# Patient Record
Sex: Female | Born: 1980
Health system: Southern US, Community
[De-identification: ages and names within clinical notes are randomized; demographics above are authoritative.]

## PROBLEM LIST (undated history)

## (undated) DIAGNOSIS — D649 Anemia, unspecified: Secondary | ICD-10-CM

## (undated) DIAGNOSIS — E119 Type 2 diabetes mellitus without complications: Secondary | ICD-10-CM

## (undated) DIAGNOSIS — R011 Cardiac murmur, unspecified: Secondary | ICD-10-CM

## (undated) DIAGNOSIS — R51 Headache: Secondary | ICD-10-CM

## (undated) DIAGNOSIS — K219 Gastro-esophageal reflux disease without esophagitis: Secondary | ICD-10-CM

## (undated) DIAGNOSIS — R519 Headache, unspecified: Secondary | ICD-10-CM

## (undated) DIAGNOSIS — Z87442 Personal history of urinary calculi: Secondary | ICD-10-CM

## (undated) DIAGNOSIS — T7840XA Allergy, unspecified, initial encounter: Secondary | ICD-10-CM

## (undated) DIAGNOSIS — M1990 Unspecified osteoarthritis, unspecified site: Secondary | ICD-10-CM

## (undated) DIAGNOSIS — N921 Excessive and frequent menstruation with irregular cycle: Secondary | ICD-10-CM

## (undated) DIAGNOSIS — Z8669 Personal history of other diseases of the nervous system and sense organs: Secondary | ICD-10-CM

## (undated) DIAGNOSIS — K5900 Constipation, unspecified: Secondary | ICD-10-CM

## (undated) DIAGNOSIS — E559 Vitamin D deficiency, unspecified: Secondary | ICD-10-CM

## (undated) DIAGNOSIS — I10 Essential (primary) hypertension: Secondary | ICD-10-CM

## (undated) DIAGNOSIS — R7303 Prediabetes: Secondary | ICD-10-CM

## (undated) DIAGNOSIS — L409 Psoriasis, unspecified: Secondary | ICD-10-CM

## (undated) DIAGNOSIS — IMO0001 Reserved for inherently not codable concepts without codable children: Secondary | ICD-10-CM

## (undated) HISTORY — DX: Constipation, unspecified: K59.00

## (undated) HISTORY — DX: Allergy, unspecified, initial encounter: T78.40XA

## (undated) HISTORY — DX: Prediabetes: R73.03

## (undated) HISTORY — DX: Psoriasis, unspecified: L40.9

## (undated) HISTORY — DX: Personal history of other diseases of the nervous system and sense organs: Z86.69

## (undated) HISTORY — DX: Vitamin D deficiency, unspecified: E55.9

## (undated) HISTORY — PX: WISDOM TOOTH EXTRACTION: SHX21

## (undated) HISTORY — PX: KNEE CARTILAGE SURGERY: SHX688

---

## 2001-07-24 ENCOUNTER — Emergency Department (HOSPITAL_COMMUNITY): Admission: EM | Admit: 2001-07-24 | Discharge: 2001-07-24 | Payer: Self-pay | Admitting: Emergency Medicine

## 2001-07-24 ENCOUNTER — Encounter: Payer: Self-pay | Admitting: Emergency Medicine

## 2002-10-03 ENCOUNTER — Emergency Department (HOSPITAL_COMMUNITY): Admission: EM | Admit: 2002-10-03 | Discharge: 2002-10-03 | Payer: Self-pay | Admitting: Emergency Medicine

## 2002-10-03 ENCOUNTER — Encounter: Payer: Self-pay | Admitting: Emergency Medicine

## 2003-10-19 ENCOUNTER — Emergency Department (HOSPITAL_COMMUNITY): Admission: AD | Admit: 2003-10-19 | Discharge: 2003-10-19 | Payer: Self-pay | Admitting: Family Medicine

## 2003-10-29 ENCOUNTER — Observation Stay (HOSPITAL_COMMUNITY): Admission: AD | Admit: 2003-10-29 | Discharge: 2003-10-29 | Payer: Self-pay | Admitting: Obstetrics and Gynecology

## 2003-10-29 HISTORY — PX: DILATION AND CURETTAGE OF UTERUS: SHX78

## 2003-10-30 ENCOUNTER — Inpatient Hospital Stay (HOSPITAL_COMMUNITY): Admission: AD | Admit: 2003-10-30 | Discharge: 2003-10-31 | Payer: Self-pay | Admitting: Obstetrics and Gynecology

## 2003-10-30 ENCOUNTER — Encounter (INDEPENDENT_AMBULATORY_CARE_PROVIDER_SITE_OTHER): Payer: Self-pay | Admitting: Specialist

## 2003-11-05 ENCOUNTER — Inpatient Hospital Stay (HOSPITAL_COMMUNITY): Admission: AD | Admit: 2003-11-05 | Discharge: 2003-11-05 | Payer: Self-pay | Admitting: Obstetrics and Gynecology

## 2003-11-28 HISTORY — PX: DILATION AND CURETTAGE OF UTERUS: SHX78

## 2003-12-15 ENCOUNTER — Inpatient Hospital Stay (HOSPITAL_COMMUNITY): Admission: AD | Admit: 2003-12-15 | Discharge: 2003-12-16 | Payer: Self-pay | Admitting: Obstetrics and Gynecology

## 2003-12-18 ENCOUNTER — Inpatient Hospital Stay (HOSPITAL_COMMUNITY): Admission: AD | Admit: 2003-12-18 | Discharge: 2003-12-18 | Payer: Self-pay | Admitting: Obstetrics and Gynecology

## 2003-12-19 ENCOUNTER — Ambulatory Visit (HOSPITAL_COMMUNITY): Admission: AD | Admit: 2003-12-19 | Discharge: 2003-12-19 | Payer: Self-pay | Admitting: Obstetrics and Gynecology

## 2003-12-20 ENCOUNTER — Encounter (INDEPENDENT_AMBULATORY_CARE_PROVIDER_SITE_OTHER): Payer: Self-pay | Admitting: Specialist

## 2004-02-28 ENCOUNTER — Encounter: Admission: RE | Admit: 2004-02-28 | Discharge: 2004-02-28 | Payer: Self-pay | Admitting: Internal Medicine

## 2004-03-15 ENCOUNTER — Encounter: Admission: RE | Admit: 2004-03-15 | Discharge: 2004-03-15 | Payer: Self-pay | Admitting: Internal Medicine

## 2004-06-25 ENCOUNTER — Emergency Department (HOSPITAL_COMMUNITY): Admission: EM | Admit: 2004-06-25 | Discharge: 2004-06-25 | Payer: Self-pay | Admitting: Family Medicine

## 2004-07-17 ENCOUNTER — Emergency Department (HOSPITAL_COMMUNITY): Admission: EM | Admit: 2004-07-17 | Discharge: 2004-07-17 | Payer: Self-pay | Admitting: Family Medicine

## 2004-07-19 ENCOUNTER — Emergency Department (HOSPITAL_COMMUNITY): Admission: EM | Admit: 2004-07-19 | Discharge: 2004-07-19 | Payer: Self-pay

## 2004-10-23 ENCOUNTER — Emergency Department (HOSPITAL_COMMUNITY): Admission: EM | Admit: 2004-10-23 | Discharge: 2004-10-23 | Payer: Self-pay | Admitting: Family Medicine

## 2005-05-22 ENCOUNTER — Emergency Department (HOSPITAL_COMMUNITY): Admission: EM | Admit: 2005-05-22 | Discharge: 2005-05-22 | Payer: Self-pay | Admitting: Family Medicine

## 2005-07-09 ENCOUNTER — Other Ambulatory Visit: Admission: RE | Admit: 2005-07-09 | Discharge: 2005-07-09 | Payer: Self-pay | Admitting: Obstetrics and Gynecology

## 2005-08-25 ENCOUNTER — Emergency Department (HOSPITAL_COMMUNITY): Admission: EM | Admit: 2005-08-25 | Discharge: 2005-08-25 | Payer: Self-pay | Admitting: Emergency Medicine

## 2005-09-14 ENCOUNTER — Emergency Department (HOSPITAL_COMMUNITY): Admission: EM | Admit: 2005-09-14 | Discharge: 2005-09-14 | Payer: Self-pay | Admitting: Emergency Medicine

## 2005-09-15 ENCOUNTER — Emergency Department (HOSPITAL_COMMUNITY): Admission: EM | Admit: 2005-09-15 | Discharge: 2005-09-15 | Payer: Self-pay | Admitting: Emergency Medicine

## 2005-11-02 ENCOUNTER — Ambulatory Visit: Payer: Self-pay | Admitting: Family Medicine

## 2005-11-21 ENCOUNTER — Ambulatory Visit: Payer: Self-pay | Admitting: Family Medicine

## 2006-01-11 ENCOUNTER — Ambulatory Visit: Payer: Self-pay | Admitting: Family Medicine

## 2006-01-14 ENCOUNTER — Ambulatory Visit (HOSPITAL_COMMUNITY): Admission: RE | Admit: 2006-01-14 | Discharge: 2006-01-14 | Payer: Self-pay | Admitting: Family Medicine

## 2006-01-18 ENCOUNTER — Ambulatory Visit: Payer: Self-pay | Admitting: Family Medicine

## 2006-01-22 ENCOUNTER — Ambulatory Visit: Payer: Self-pay | Admitting: Family Medicine

## 2006-01-22 ENCOUNTER — Ambulatory Visit: Payer: Self-pay | Admitting: *Deleted

## 2006-02-18 ENCOUNTER — Emergency Department (HOSPITAL_COMMUNITY): Admission: EM | Admit: 2006-02-18 | Discharge: 2006-02-18 | Payer: Self-pay | Admitting: Family Medicine

## 2006-04-15 ENCOUNTER — Ambulatory Visit: Payer: Self-pay | Admitting: Family Medicine

## 2006-05-06 ENCOUNTER — Ambulatory Visit: Payer: Self-pay | Admitting: Family Medicine

## 2006-07-09 ENCOUNTER — Ambulatory Visit: Payer: Self-pay | Admitting: Family Medicine

## 2006-08-23 ENCOUNTER — Ambulatory Visit: Payer: Self-pay | Admitting: Family Medicine

## 2006-09-01 ENCOUNTER — Emergency Department (HOSPITAL_COMMUNITY): Admission: EM | Admit: 2006-09-01 | Discharge: 2006-09-01 | Payer: Self-pay | Admitting: Family Medicine

## 2006-10-01 ENCOUNTER — Ambulatory Visit: Payer: Self-pay | Admitting: Family Medicine

## 2006-10-12 ENCOUNTER — Emergency Department (HOSPITAL_COMMUNITY): Admission: EM | Admit: 2006-10-12 | Discharge: 2006-10-12 | Payer: Self-pay | Admitting: Emergency Medicine

## 2006-11-09 ENCOUNTER — Emergency Department (HOSPITAL_COMMUNITY): Admission: EM | Admit: 2006-11-09 | Discharge: 2006-11-09 | Payer: Self-pay | Admitting: Emergency Medicine

## 2006-11-10 ENCOUNTER — Emergency Department (HOSPITAL_COMMUNITY): Admission: EM | Admit: 2006-11-10 | Discharge: 2006-11-10 | Payer: Self-pay | Admitting: Emergency Medicine

## 2006-11-11 ENCOUNTER — Emergency Department (HOSPITAL_COMMUNITY): Admission: EM | Admit: 2006-11-11 | Discharge: 2006-11-11 | Payer: Self-pay | Admitting: Emergency Medicine

## 2006-12-24 ENCOUNTER — Ambulatory Visit: Payer: Self-pay | Admitting: Family Medicine

## 2007-03-14 ENCOUNTER — Ambulatory Visit: Payer: Self-pay | Admitting: Family Medicine

## 2007-04-16 ENCOUNTER — Encounter (INDEPENDENT_AMBULATORY_CARE_PROVIDER_SITE_OTHER): Payer: Self-pay | Admitting: *Deleted

## 2007-05-08 ENCOUNTER — Ambulatory Visit: Payer: Self-pay | Admitting: Family Medicine

## 2007-05-08 ENCOUNTER — Encounter (INDEPENDENT_AMBULATORY_CARE_PROVIDER_SITE_OTHER): Payer: Self-pay | Admitting: Family Medicine

## 2007-05-08 LAB — CONVERTED CEMR LAB
AST: 13 units/L (ref 0–37)
Albumin: 4.4 g/dL (ref 3.5–5.2)
Alkaline Phosphatase: 60 units/L (ref 39–117)
Calcium: 9.3 mg/dL (ref 8.4–10.5)
Creatinine, Ser: 0.62 mg/dL (ref 0.40–1.20)
Potassium: 4 meq/L (ref 3.5–5.3)
Total Protein: 7.4 g/dL (ref 6.0–8.3)
Triglycerides: 109 mg/dL (ref <150)

## 2007-05-24 ENCOUNTER — Emergency Department (HOSPITAL_COMMUNITY): Admission: EM | Admit: 2007-05-24 | Discharge: 2007-05-24 | Payer: Self-pay | Admitting: Family Medicine

## 2007-06-09 ENCOUNTER — Ambulatory Visit: Payer: Self-pay | Admitting: Family Medicine

## 2007-06-16 ENCOUNTER — Ambulatory Visit: Payer: Self-pay | Admitting: Family Medicine

## 2007-08-16 ENCOUNTER — Emergency Department (HOSPITAL_COMMUNITY): Admission: EM | Admit: 2007-08-16 | Discharge: 2007-08-16 | Payer: Self-pay | Admitting: Emergency Medicine

## 2007-08-25 ENCOUNTER — Ambulatory Visit: Payer: Self-pay | Admitting: Family Medicine

## 2007-08-25 LAB — CONVERTED CEMR LAB
T3, Total: 133.1 ng/dL (ref 80.0–204.0)
T4, Total: 7.9 ug/dL (ref 5.0–12.5)
TSH: 1.366 microintl units/mL (ref 0.350–5.50)

## 2007-09-01 ENCOUNTER — Ambulatory Visit: Payer: Self-pay | Admitting: Family Medicine

## 2007-09-25 ENCOUNTER — Ambulatory Visit: Payer: Self-pay | Admitting: Family Medicine

## 2007-10-09 ENCOUNTER — Ambulatory Visit: Payer: Self-pay | Admitting: Family Medicine

## 2007-10-24 ENCOUNTER — Emergency Department (HOSPITAL_COMMUNITY): Admission: EM | Admit: 2007-10-24 | Discharge: 2007-10-24 | Payer: Self-pay | Admitting: Emergency Medicine

## 2007-11-02 ENCOUNTER — Emergency Department (HOSPITAL_COMMUNITY): Admission: EM | Admit: 2007-11-02 | Discharge: 2007-11-02 | Payer: Self-pay | Admitting: Family Medicine

## 2007-11-10 ENCOUNTER — Ambulatory Visit: Payer: Self-pay | Admitting: Family Medicine

## 2007-11-10 LAB — CONVERTED CEMR LAB
AST: 17 units/L (ref 0–37)
CO2: 22 meq/L (ref 19–32)
Chloride: 106 meq/L (ref 96–112)
Glucose, Bld: 110 mg/dL — ABNORMAL HIGH (ref 70–99)
Potassium: 4.2 meq/L (ref 3.5–5.3)
Sodium: 142 meq/L (ref 135–145)

## 2007-11-21 ENCOUNTER — Ambulatory Visit: Payer: Self-pay | Admitting: Internal Medicine

## 2007-11-24 ENCOUNTER — Ambulatory Visit (HOSPITAL_COMMUNITY): Admission: RE | Admit: 2007-11-24 | Discharge: 2007-11-24 | Payer: Self-pay | Admitting: Family Medicine

## 2007-11-27 ENCOUNTER — Ambulatory Visit: Payer: Self-pay | Admitting: Internal Medicine

## 2007-12-16 ENCOUNTER — Ambulatory Visit: Payer: Self-pay | Admitting: Internal Medicine

## 2008-01-09 ENCOUNTER — Ambulatory Visit: Payer: Self-pay | Admitting: Family Medicine

## 2008-01-12 ENCOUNTER — Ambulatory Visit: Payer: Self-pay | Admitting: Internal Medicine

## 2008-02-10 ENCOUNTER — Inpatient Hospital Stay (HOSPITAL_COMMUNITY): Admission: AD | Admit: 2008-02-10 | Discharge: 2008-02-10 | Payer: Self-pay | Admitting: Gynecology

## 2008-02-12 ENCOUNTER — Ambulatory Visit: Payer: Self-pay | Admitting: Internal Medicine

## 2008-03-12 ENCOUNTER — Ambulatory Visit: Payer: Self-pay | Admitting: Family Medicine

## 2008-05-03 ENCOUNTER — Emergency Department (HOSPITAL_COMMUNITY): Admission: EM | Admit: 2008-05-03 | Discharge: 2008-05-03 | Payer: Self-pay | Admitting: Family Medicine

## 2008-05-05 ENCOUNTER — Ambulatory Visit: Payer: Self-pay | Admitting: Internal Medicine

## 2008-06-08 ENCOUNTER — Emergency Department (HOSPITAL_COMMUNITY): Admission: EM | Admit: 2008-06-08 | Discharge: 2008-06-08 | Payer: Self-pay | Admitting: Emergency Medicine

## 2008-07-27 ENCOUNTER — Ambulatory Visit: Payer: Self-pay | Admitting: Internal Medicine

## 2008-09-19 ENCOUNTER — Emergency Department (HOSPITAL_COMMUNITY): Admission: EM | Admit: 2008-09-19 | Discharge: 2008-09-19 | Payer: Self-pay | Admitting: Emergency Medicine

## 2008-10-11 ENCOUNTER — Ambulatory Visit: Payer: Self-pay | Admitting: Internal Medicine

## 2009-01-03 ENCOUNTER — Ambulatory Visit: Payer: Self-pay | Admitting: Family Medicine

## 2009-03-25 ENCOUNTER — Encounter (INDEPENDENT_AMBULATORY_CARE_PROVIDER_SITE_OTHER): Payer: Self-pay | Admitting: Family Medicine

## 2009-03-25 ENCOUNTER — Ambulatory Visit: Payer: Self-pay | Admitting: Family Medicine

## 2009-03-25 LAB — CONVERTED CEMR LAB
BUN: 8 mg/dL (ref 6–23)
Basophils Absolute: 0 10*3/uL (ref 0.0–0.1)
Basophils Relative: 0 % (ref 0–1)
CO2: 24 meq/L (ref 19–32)
Cholesterol: 163 mg/dL (ref 0–200)
Eosinophils Absolute: 0.2 10*3/uL (ref 0.0–0.7)
Glucose, Bld: 91 mg/dL (ref 70–99)
HCT: 36 % (ref 36.0–46.0)
HDL: 31 mg/dL — ABNORMAL LOW (ref >39)
Hemoglobin: 11.3 g/dL — ABNORMAL LOW (ref 12.0–15.0)
Lymphocytes Relative: 30 % (ref 12–46)
MCHC: 31.4 g/dL (ref 30.0–36.0)
MCV: 82 fL (ref 78.0–100.0)
Monocytes Relative: 8 % (ref 3–12)
RDW: 14.4 % (ref 11.5–15.5)
Triglycerides: 106 mg/dL (ref <150)
Vit D, 25-Hydroxy: 12 ng/mL — ABNORMAL LOW (ref 30–89)
WBC: 5.2 10*3/uL (ref 4.0–10.5)

## 2009-06-15 ENCOUNTER — Ambulatory Visit: Payer: Self-pay | Admitting: Internal Medicine

## 2009-07-30 ENCOUNTER — Emergency Department (HOSPITAL_COMMUNITY): Admission: EM | Admit: 2009-07-30 | Discharge: 2009-07-30 | Payer: Self-pay | Admitting: Emergency Medicine

## 2009-07-30 HISTORY — PX: COMBINED HYSTEROSCOPY DIAGNOSTIC / D&C: SUR297

## 2009-07-30 HISTORY — PX: DILATION AND CURETTAGE OF UTERUS: SHX78

## 2009-08-10 ENCOUNTER — Ambulatory Visit (HOSPITAL_COMMUNITY): Admission: RE | Admit: 2009-08-10 | Discharge: 2009-08-10 | Payer: Self-pay | Admitting: Family Medicine

## 2009-08-28 ENCOUNTER — Emergency Department (HOSPITAL_COMMUNITY): Admission: EM | Admit: 2009-08-28 | Discharge: 2009-08-28 | Payer: Self-pay | Admitting: Family Medicine

## 2009-08-29 ENCOUNTER — Ambulatory Visit: Payer: Self-pay | Admitting: Internal Medicine

## 2009-09-02 ENCOUNTER — Telehealth (INDEPENDENT_AMBULATORY_CARE_PROVIDER_SITE_OTHER): Payer: Self-pay | Admitting: *Deleted

## 2009-09-03 ENCOUNTER — Emergency Department (HOSPITAL_COMMUNITY): Admission: EM | Admit: 2009-09-03 | Discharge: 2009-09-03 | Payer: Self-pay | Admitting: Emergency Medicine

## 2009-09-13 ENCOUNTER — Ambulatory Visit: Payer: Self-pay | Admitting: Family Medicine

## 2009-11-02 ENCOUNTER — Ambulatory Visit: Payer: Self-pay | Admitting: Obstetrics & Gynecology

## 2009-11-03 ENCOUNTER — Encounter: Payer: Self-pay | Admitting: Obstetrics & Gynecology

## 2009-11-03 LAB — CONVERTED CEMR LAB: Chlamydia, DNA Probe: NEGATIVE

## 2009-11-24 ENCOUNTER — Ambulatory Visit: Payer: Self-pay | Admitting: Obstetrics & Gynecology

## 2010-01-26 ENCOUNTER — Ambulatory Visit: Payer: Self-pay | Admitting: Obstetrics & Gynecology

## 2010-01-26 ENCOUNTER — Encounter (INDEPENDENT_AMBULATORY_CARE_PROVIDER_SITE_OTHER): Payer: Self-pay | Admitting: *Deleted

## 2010-02-03 ENCOUNTER — Ambulatory Visit (HOSPITAL_COMMUNITY): Admission: RE | Admit: 2010-02-03 | Discharge: 2010-02-03 | Payer: Self-pay | Admitting: Family Medicine

## 2010-02-07 ENCOUNTER — Ambulatory Visit: Payer: Self-pay | Admitting: Family Medicine

## 2010-02-16 ENCOUNTER — Ambulatory Visit: Payer: Self-pay | Admitting: Obstetrics and Gynecology

## 2010-03-09 ENCOUNTER — Ambulatory Visit: Payer: Self-pay | Admitting: Obstetrics & Gynecology

## 2010-03-10 ENCOUNTER — Encounter: Payer: Self-pay | Admitting: Obstetrics & Gynecology

## 2010-03-10 LAB — CONVERTED CEMR LAB
HCT: 34.1 % — ABNORMAL LOW (ref 36.0–46.0)
Hemoglobin: 10.7 g/dL — ABNORMAL LOW (ref 12.0–15.0)
Platelets: 278 10*3/uL (ref 150–400)
RDW: 14.1 % (ref 11.5–15.5)

## 2010-04-13 ENCOUNTER — Ambulatory Visit: Payer: Self-pay | Admitting: Obstetrics & Gynecology

## 2010-04-13 ENCOUNTER — Ambulatory Visit (HOSPITAL_COMMUNITY): Admission: RE | Admit: 2010-04-13 | Discharge: 2010-04-13 | Payer: Self-pay | Admitting: Obstetrics & Gynecology

## 2010-05-04 ENCOUNTER — Ambulatory Visit: Payer: Self-pay | Admitting: Family Medicine

## 2010-08-03 ENCOUNTER — Ambulatory Visit: Admit: 2010-08-03 | Payer: Self-pay | Admitting: Obstetrics and Gynecology

## 2010-08-29 NOTE — Progress Notes (Signed)
Summary: triage/cough congestion  Phone Note Call from Patient   Caller: Patient Reason for Call: Talk to Nurse Summary of Call: Patient states she went to ED on 1/30 for Asthmatic Bronchitis..She states she has coughing spells and is SOB..Patient was given prednisone, allegra, albuterol inhaler and tessalon at the ED.Marland KitchenPatient states she does not have fever at this time..No vomiting or diarrhea..Dr. Audria Nine consulted...advised she may increase inhaler at times of severe coughting to 2 puffs q 5 min. up to 10 puffs if needed. New RX for tessalon given to pharmacy.Marland KitchenMarland KitchenPatient advised she probably has a virus and it has to run its course..If s/s worsen call back.  Initial call taken by: Conchita Paris,  September 02, 2009 11:09 AM

## 2010-10-12 LAB — CBC
HCT: 39.9 % (ref 36.0–46.0)
Platelets: 264 10*3/uL (ref 150–400)
RBC: 4.81 MIL/uL (ref 3.87–5.11)
RDW: 14.6 % (ref 11.5–15.5)

## 2010-10-12 LAB — BASIC METABOLIC PANEL
BUN: 8 mg/dL (ref 6–23)
Creatinine, Ser: 0.55 mg/dL (ref 0.4–1.2)
GFR calc Af Amer: >60 mL/min (ref >60)
Potassium: 3.9 mEq/L (ref 3.5–5.1)

## 2010-10-12 LAB — PREGNANCY, URINE: Preg Test, Ur: NEGATIVE

## 2010-10-15 LAB — WET PREP, GENITAL: Yeast Wet Prep HPF POC: NONE SEEN

## 2010-10-15 LAB — POCT URINALYSIS DIP (DEVICE)
Glucose, UA: NEGATIVE mg/dL
Nitrite: NEGATIVE
Protein, ur: 30 mg/dL — AB
Urobilinogen, UA: 0.2 mg/dL (ref 0.0–1.0)

## 2010-10-15 LAB — CBC
MCHC: 32.5 g/dL (ref 30.0–36.0)
Platelets: 277 10*3/uL (ref 150–400)
RDW: 15.1 % (ref 11.5–15.5)

## 2010-10-15 LAB — URINALYSIS, ROUTINE W REFLEX MICROSCOPIC
Bilirubin Urine: NEGATIVE
Ketones, ur: NEGATIVE mg/dL
Protein, ur: NEGATIVE mg/dL
Urobilinogen, UA: 0.2 mg/dL (ref 0.0–1.0)

## 2010-10-15 LAB — DIFFERENTIAL
Basophils Absolute: 0 10*3/uL (ref 0.0–0.1)
Monocytes Absolute: 0.2 10*3/uL (ref 0.1–1.0)
Neutro Abs: 4.2 10*3/uL (ref 1.7–7.7)
Neutrophils Relative %: 78 % — ABNORMAL HIGH (ref 43–77)

## 2010-10-15 LAB — URINE MICROSCOPIC-ADD ON

## 2010-10-15 LAB — POCT RAPID STREP A (OFFICE): Streptococcus, Group A Screen (Direct): NEGATIVE

## 2010-10-26 ENCOUNTER — Ambulatory Visit (INDEPENDENT_AMBULATORY_CARE_PROVIDER_SITE_OTHER): Payer: Self-pay | Admitting: Obstetrics and Gynecology

## 2010-10-26 ENCOUNTER — Encounter: Payer: Self-pay | Admitting: Obstetrics and Gynecology

## 2010-10-26 ENCOUNTER — Other Ambulatory Visit: Payer: Self-pay | Admitting: Obstetrics and Gynecology

## 2010-10-26 DIAGNOSIS — N893 Dysplasia of vagina, unspecified: Secondary | ICD-10-CM

## 2010-10-26 LAB — POCT URINALYSIS DIP (DEVICE)
Bilirubin Urine: NEGATIVE
Glucose, UA: NEGATIVE mg/dL
Nitrite: NEGATIVE
Urobilinogen, UA: 0.2 mg/dL (ref 0.0–1.0)
pH: 6.5 (ref 5.0–8.0)

## 2010-10-27 ENCOUNTER — Encounter: Payer: Self-pay | Admitting: Physician Assistant

## 2010-10-27 LAB — CONVERTED CEMR LAB: Clue Cells Wet Prep HPF POC: NONE SEEN

## 2010-10-27 NOTE — Progress Notes (Unsigned)
NAMEEBONYE, READE             ACCOUNT NO.:  [id number]  MEDICAL RECORD NO.:  [id number]           PATIENT TYPE:  A  LOCATION:  [facility]                   FACILITY:  [facility]  PHYSICIAN:  Argentina Donovan, MD        DATE OF BIRTH:  1981/02/10  DATE OF SERVICE:  10/26/2010                                 CLINIC NOTE  The patient is a 30 year old morbidly obese 320-pound, 5 feet 5-inch tall African American female, virginal who has came in complaining of very heavy vaginal discharge, not knowing whether she was urinating or had a discharge.  She will go to the bathroom frequently, but still feels like she has to go.  On examination, the external genitalia in this obese patient is normal. Introitus is small and virginal.  The vagina is clean and well rugated. The cervix is clean, nulliparous.  The wet prep was taken.  I could not demonstrate any cystocele or leakage when the patient coughed or bear down.  I am going to get a urine and urine culture on her that she had a referral apparently before to a urologist, but they want to do testing and she could not afford it.  So, we will evaluate the urine as well as a wet prep and see whether we can treat her or whether that is truly what she needs.  She has recently had a D and C and hysteroscopy because of dysfunctional bleeding in September 2011, that has significantly improved she said.  IMPRESSION:  Heavy vaginal discharge.  She has never had sex, is planning too soon and wanted some counseling on that, so we talked to her about positions, etc.          ______________________________ Argentina Donovan, MD    PR/MEDQ  D:  10/26/2010  T:  10/27/2010  Job:  [id number]

## 2010-12-15 NOTE — Discharge Summary (Signed)
NAMEREXANNA, LOUTHAN                         ACCOUNT NO.:  [id number]   MEDICAL RECORD NO.:  [id number]                   PATIENT TYPE:  INP   LOCATION:  9304                                 [facility]   PHYSICIAN:  Randye Lobo, M.D.                DATE OF BIRTH:  [birth year]   DATE OF ADMISSION:  10/30/2003  DATE OF DISCHARGE:  10/31/2003                                 DISCHARGE SUMMARY   ADMISSION DIAGNOSES:  1. Menometrorrhagia.  2. Probable anovulatory uterine bleeding.  3. Thickened endometrium with possible submucosal fibroids.  4. Anemia.   DISCHARGE DIAGNOSES:  1. Menometrorrhagia.  2. Probable anovulatory uterine bleeding.  3. Thickened endometrium.  4. Anemia.   SIGNIFICANT OPERATIONS AND PROCEDURES:  The patient underwent a dilatation  and curettage by  Randye Lobo, M.D.   HISTORY OF PRESENT ILLNESS:  The patient is a 30 year old African American  female discharged from [facility] on 10/29/03  for  observation for vaginal bleeding and pelvic pain who presented with  continuation of her symptoms on 10/30/03 .  The patient had a long history of  irregular menses and a history of anemia since August 2004.  She recently  represented with continuation of her symptoms and was found to be anemic  with hemoglobin of 7.7 on October 28, 2002, when she went to [facility].  The patient was transferred to the [facility] for further  evaluation and care.  The patient was observed and her hemoglobin at  discharge was 6.7  which she was tolerating at that time.  She was treated  with a tapered dose of oral contraceptive pills, and she did have an  ultrasound sent prior to the discharge which showed thickened endomentrial  complex and a submucous  versus a pedunculated fibroid which measured  approximately 2.4 cm.  There was also a left ovarian cyst measuring 4.3 cm.  The patient apparently had a tentative plan to proceed with a  hysteroscopy  and D&C, however, she represented with continuation of her symptoms the  morning of October 30, 2003.   The patient had stable vital signs upon admission.  Her temperature was 99.2  F.  The patient had tenderness to deep palpation of the bilateral upper  quadrants of the abdomen.  The patient would not tolerate a speculum  examination and the cervix with palpation demonstrated no abnormalities.   The patient's hemoglobin was 6.6.  She had a negative pregnancy test on  October 29, 2003.  The patient's PT and PTT were normal.   HOSPITAL COURSE:  The patient was admitted with menometrorrhagia and anemia  and plan was to proceed with a dilatation and curettage which was performed  under MAC anesthesia.  The surgery was without complications.  Of note, the  patient had difficulty with venous access.  The patient did have a vaginal  packing overnight.  The packing was removed on the morning of October 31, 2003.  The patient had minimal vaginal bleeding following this.  Her hemoglobin on  her day of discharge was 5.9 and she was tolerating this with minimal  lightheadedness.  Her vital signs remained stable throughout  hospitalization.   Dr. Josem Kaufmann of hematology/oncology was contacted regarding the patient's low  hemoglobin and a discussion was held regarding whether or not transfusion  should proceed strictly based on the low hemoglobin value as the patient was  minimally symptomatic.  A decision was made to proceed with further serum  studies to discover the nature of the anemia as the patient had not been  evaluated to any extent for this.  The patient will have a ferritin level,  total iron binding capacity, total iron studies, B12 and folate levels, LVH,  haptoglobin, reticulocyte count, and a sickle cell trait performed prior to  her discharge.  She was not transfused.   The patient will be discharged to home today.  Her medications include  Percocet one to two p.o. q.4-6h.  p.r.n. pain.  Ibuprofen 600 mg p.o. q.6h.  p.r.n. pain.  The patient will also take iron sulfate 325 mg p.o. t.i.d.   The patient will follow up in the office with her primary gynecologist, Dr.  Henderson Cloud at the end of the week.   Anemia precautions were given to the patient prior to discharge.                                               Randye Lobo, M.D.    BES/MEDQ  D:  10/31/2003  T:  11/01/2003  Job:  [id number]

## 2010-12-15 NOTE — Op Note (Signed)
Molly Archer, Molly Archer                       ACCOUNT NO.:  [id number]   MEDICAL RECORD NO.:  [id number]                   PATIENT TYPE:  AMB   LOCATION:  [facility]                                  [facility]   PHYSICIAN:  Malva Limes, M.D.                 DATE OF BIRTH:  09-19-1980   DATE OF PROCEDURE:  12/19/2003  DATE OF DISCHARGE:                                 OPERATIVE REPORT   PREOPERATIVE DIAGNOSES:  1. Menorrhagia.  2. History of complex hyperplasia with atypia.   POSTOPERATIVE DIAGNOSES:  1. Menorrhagia.  2. History of complex hyperplasia with atypia.   PROCEDURE:  Dilation and curettage.   SURGEON:  Malva Limes, M.D.   ANESTHESIA:  Spinal.   ANTIBIOTICS:  Ancef 1 g.   ESTIMATED BLOOD LOSS:  25 mL.   COMPLICATIONS:  None.   PROCEDURE:  The patient was taken to the operating room, where a spinal  anesthetic was administered without complication.  She was then placed in  the dorsal lithotomy position.  She was prepped with Hibiclens and draped in  the usual fashion for this procedure.  A weighted speculum was placed into  the vagina.  The cervix was grasped with a single-tooth tenaculum.  The  uterus was sounded to 9 cm.  The cervical os was then dilated to 27 [place],  a sharp curettage was placed into the uterine cavity, and sharp curettage  was performed.  A moderate amount of tissue was obtained and sent to  pathology.  The polyp forceps were placed throughout the uterine cavity and  no evidence of a pedunculated polyp or fibroid was found.  Aggressive  scraping on the endometrium did not reveal any findings consistent with a  submucous fibroid.  This concluded the procedure.  The patient was taken to  the recovery room in stable condition.  She will follow up in the office in  two days.  The patient will continue taking her iron.  She also will be  given Megace in the office to treat the hyperplasia with atypia.         Malva Limes, M.D.    MA/MEDQ  D:  12/19/2003  T:  12/20/2003  Job:  [id number]

## 2010-12-15 NOTE — Op Note (Signed)
Molly Archer, Molly Archer                         ACCOUNT NO.:  [id number]   MEDICAL RECORD NO.:  [id number]                   PATIENT TYPE:  INP   LOCATION:  9304                                 [facility]   PHYSICIAN:  Randye Lobo, M.D.                DATE OF BIRTH:  Dec 30, 1980   DATE OF PROCEDURE:  10/30/2003  DATE OF DISCHARGE:  10/31/2003                                 OPERATIVE REPORT   PREOPERATIVE DIAGNOSES:  1. Menometrorrhagia.  2. Pelvic pain.  3. Anemia.  4. Thickened endometrium, possible submucous fibroid.   POSTOPERATIVE DIAGNOSES:  1. Menometrorrhagia.  2. Pelvic pain.  3. Anemia.  4. Thickened endometrium, possible submucous fibroid.   PROCEDURES:  1. Examination under anesthesia.  2. Dilation and curettage.   ANESTHESIA:  MAC, paracervical block with 1% lidocaine.   FLUIDS REPLACED:  1700 mL Ringer's lactate.   ESTIMATED BLOOD LOSS:  100 mL.   URINE OUTPUT:  375 mL.   COMPLICATIONS:  None.   INDICATION FOR PROCEDURE:  The patient is a 30 year old African-American  female status post discharge from the [facility] on October 29, 2003, for  evaluation and observation of vaginal bleeding and pelvic pain, who  presented to the [facility] again on October 30, 2003, with similar  complaints.  The patient apparently has had vaginal bleeding since August 14, 2003.  Her hemoglobin was noted to be 6.7 at the time of her discharge  on October 29, 2003.  The patient was treated with a taper of oral  contraceptive pills, and she apparently had a pelvic ultrasound performed  documenting a thickened endometrium and a possible submucous fibroid versus  a pedunculated fibroid.  The patient was tentatively planned for a  hysteroscopy and D&C.  The patient re-presented today because of  continuation of her symptoms of pain and bleeding.  The patient's urine  pregnancy test was negative on October 29, 2003.  Her hemoglobin today measured  6.6.  Her PT and PTT  were 13.8 and 31, respectively.  The patient would not  tolerate a speculum examination in the maternity admissions area, and a  limited bimanual exam confirmed a cervix without lesions.  The patient's  transvaginal ultrasound was reviewed, which documented a 2.4 cm mass in the  lower uterine segment consistent with a possible pedunculated versus  submucosal fibroid.  The endometrium measured 3.4 cm in thickness and had a  5-7 mm hypoechoic cyst.  There was also a 4.3 cm cyst of the left ovary, and  the right ovary was not seen.   The patient was given a diagnosis of continued menometrorrhagia and pelvic  pain in addition to anemia and a thickened endometrium on ultrasound, and  the plan was made to proceed with a dilation and curettage and a possible  hysteroscopy after risks, benefits, and alternatives were discussed with her  and her mother.  They chose to  proceed.   FINDINGS:  Exam under anesthesia revealed the cervix and vagina without  lesions.  It was difficult to palpate the uterus secondary to the patient's  body habitus.  The patient was noted to have a dilated cervix with clot and  tissue passing through the cervical os.  Curettage of the endometrium  revealed a very large amount of endometrial curettings.  After the  endometrial curettage was performed there was still a small amount of  bleeding which was appreciated, and an attempt was made to pass a pediatric  Foley through the cervical os.  The patient did not tolerate this due to the  MAC anesthetic, and the decision was made to instead place a plain gauze  vaginal packing in the vagina.   SPECIMENS:  Endometrial curettings were sent to pathology.   PROCEDURE:  The patient was taken from the maternity admissions area down to  the operating room suite.  The patient was noted to have a suboptimal IV  access at this time, and an additional IV was started.  Based on the  patient's obesity and poor airway and difficulty with  large-bore IV access,  the decision was made to proceed with only the dilation and curettage  procedure.  The patient was placed in the dorsal lithotomy position and MAC  anesthesia was induced.  The perineum and the vagina were sterilely prepped,  and the bladder was catheterized of urine.  The patient was then sterilely  draped.   An examination under anesthesia was performed and the findings are as noted  above.  A speculum was placed inside the vagina and a single-tooth tenaculum  was placed on the anterior cervical lip.  A paracervical block was performed  with a total of 10 mL of 1% lidocaine.  A large amount of tissue and clot  were immediately removed from the external os of the cervix.  The cervix was  noted to be dilated and the serrated and then the smooth curette were  sequentially introduced into the uterine cavity and all four quadrants of  the uterine cavity were curetted and the tissue was sent to pathology.  There was no appreciation of a submucosal fibroid; however, palpation with  the curette was somewhat difficult due to the patient's MAC anesthesia.  A  decision was made not to convert to general endotracheal anesthesia due to  the patient's poor airway.  After a large amount of curettings were removed,  there was still a small amount of bleeding which was noted to be coming from  the cervical os, and initially an attempt was made to place a pediatric  Foley in the uterine cavity.  This was, however, not successful and the  vaginal instruments were removed, and instead a vaginal packing was placed  of plain gauze.   This completed the patient's procedure.  There were no complications.  All  needle, instrument, and sponge counts were correct.                                               Randye Lobo, M.D.    BES/MEDQ  D:  10/30/2003  T:  11/01/2003  Job:  [id number]

## 2011-04-26 LAB — URINALYSIS, ROUTINE W REFLEX MICROSCOPIC
Glucose, UA: NEGATIVE
Ketones, ur: NEGATIVE
Nitrite: NEGATIVE
Specific Gravity, Urine: 1.015
pH: 6.5

## 2011-04-26 LAB — CBC
HCT: 32.3 — ABNORMAL LOW
Hemoglobin: 10.8 — ABNORMAL LOW
RBC: 4.05
RDW: 14.8
WBC: 3.1 — ABNORMAL LOW

## 2011-04-26 LAB — BASIC METABOLIC PANEL
BUN: 6
Chloride: 106
GFR calc Af Amer: >60
GFR calc non Af Amer: >60
Potassium: 3.7

## 2011-04-26 LAB — DIFFERENTIAL
Eosinophils Relative: 1
Lymphocytes Relative: 27
Lymphs Abs: 0.8
Monocytes Absolute: 0.4
Monocytes Relative: 13 — ABNORMAL HIGH
Neutro Abs: 1.8

## 2011-04-26 LAB — WET PREP, GENITAL: Clue Cells Wet Prep HPF POC: NONE SEEN

## 2011-05-09 LAB — POCT RAPID STREP A: Streptococcus, Group A Screen (Direct): NEGATIVE

## 2011-09-26 ENCOUNTER — Emergency Department (HOSPITAL_COMMUNITY)
Admission: EM | Admit: 2011-09-26 | Discharge: 2011-09-26 | Disposition: A | Discharge status: Home / Self Care | Payer: Self-pay | Attending: Emergency Medicine | Admitting: Emergency Medicine

## 2011-09-26 ENCOUNTER — Encounter (HOSPITAL_COMMUNITY): Payer: Self-pay | Admitting: Emergency Medicine

## 2011-09-26 DIAGNOSIS — I10 Essential (primary) hypertension: Secondary | ICD-10-CM | POA: Insufficient documentation

## 2011-09-26 DIAGNOSIS — L509 Urticaria, unspecified: Secondary | ICD-10-CM | POA: Insufficient documentation

## 2011-09-26 HISTORY — DX: Essential (primary) hypertension: I10

## 2011-09-26 MED ORDER — METHYLPREDNISOLONE SODIUM SUCC 125 MG IJ SOLR
125.0000 mg | Freq: Once | INTRAMUSCULAR | Status: AC
  Administered 2011-09-26: 125 mg via INTRAMUSCULAR
  Filled 2011-09-26: qty 2

## 2011-09-26 MED ORDER — FAMOTIDINE 20 MG PO TABS: 20.0000 mg | ORAL_TABLET | Freq: Two times a day (BID) | ORAL | Status: DC

## 2011-09-26 MED ORDER — HYDROXYZINE HCL 25 MG PO TABS: 25.0000 mg | ORAL_TABLET | Freq: Four times a day (QID) | ORAL | Status: AC

## 2011-09-26 NOTE — ED Notes (Signed)
Pt states she is having an allergic reaction to mucus relief DM  Pt states she had a cough and took some of that and since then she has been itching and has a red rash that she states itches  Sxs started yesterday morning  Pt states she was seen by her PCP yesterday and was started on prednisone and has been taking benadryl and using hydrocortisone cream but it is not helping

## 2011-09-26 NOTE — ED Provider Notes (Signed)
History     CSN: [id number]  Arrival date & time 09/26/11  1903   First MD Initiated Contact with Patient 09/26/11 2024      Chief Complaint  Patient presents with  . Allergic Reaction    (Consider location/radiation/quality/duration/timing/severity/associated sxs/prior treatment) Patient is a 31 y.o. female presenting with allergic reaction. The history is provided by the patient.  Allergic Reaction The primary symptoms are  rash and urticaria. The primary symptoms do not include wheezing, shortness of breath, nausea, vomiting or angioedema. The current episode started 2 days ago. The problem has not changed since onset. Pt states she believes that she got a reaction to a cold medicine she took two days ago. States she developed rash that is very itchy all over the body. She went to a clinic where she was prescribed benadryl 50mg  every 6 hrs, prednisone 60mg . States she is taking it with no relief. Feels like rash worsening. Denies swelling of lips, tongue, throat. NO other complaints.  Past Medical History  Diagnosis Date  . Asthma   . Hypertension     Past Surgical History  Procedure Date  . Combined hysteroscopy diagnostic / d&c   . Dilation and curettage of uterus     Family History  Problem Relation Age of Onset  . Hypertension Other   . Diabetes Other   . Cancer Other     History  Substance Use Topics  . Smoking status: Never Smoker   . Smokeless tobacco: Not on file  . Alcohol Use: No    OB History    Grav Para Term Preterm Abortions TAB SAB Ect Mult Living                  Review of Systems  Constitutional: Negative for fever and chills.  HENT: Negative for facial swelling, drooling and trouble swallowing.   Eyes: Negative.   Respiratory: Negative for shortness of breath and wheezing.   Cardiovascular: Negative.   Gastrointestinal: Negative for nausea and vomiting.  Genitourinary: Negative.   Musculoskeletal: Negative.   Skin: Positive for rash.    Neurological: Negative.   Psychiatric/Behavioral: Negative.     Allergies  Codeine; Penicillins; and Shellfish allergy  Home Medications   Current Outpatient Rx  Name Route Sig Dispense Refill  . ALBUTEROL SULFATE HFA 108 (90 BASE) MCG/ACT IN AERS Inhalation Inhale 2 puffs into the lungs every 4 (four) hours as needed. For cough/wheeze.    Marland Kitchen DEXTROMETHORPHAN-GUAIFENESIN 20-400 MG PO TABS Oral Take 1 tablet by mouth every 4 (four) hours as needed. Congestion.    Marland Kitchen DIPHENHYDRAMINE HCL 25 MG PO CAPS Oral Take 50 mg by mouth every 6 (six) hours as needed. Itching.    Marland Kitchen LOSARTAN POTASSIUM 50 MG PO TABS Oral Take 50 mg by mouth daily.    Marland Kitchen PREDNISONE 20 MG PO TABS Oral Take by mouth See admin instructions. Take 2 tablets once daily for 5 days, then 1 tablet once daily for 5 days.    Marland Kitchen FAMOTIDINE 20 MG PO TABS Oral Take 1 tablet (20 mg total) by mouth 2 (two) times daily. 30 tablet 0  . HYDROXYZINE HCL 25 MG PO TABS Oral Take 1 tablet (25 mg total) by mouth every 6 (six) hours. 12 tablet 0    BP 136/87  Pulse 78  Temp(Src) 98.7 F (37.1 C) (Oral)  Resp 20  SpO2 100%  LMP 09/17/2011  Physical Exam  Nursing note and vitals reviewed. Constitutional: She is oriented to person, place,  and time. She appears well-developed and well-nourished.  HENT:  Head: Normocephalic.       Lips, tongue, uvula normal  Eyes: Conjunctivae are normal.  Neck: Neck supple.  Cardiovascular: Normal rate, regular rhythm and normal heart sounds.   Pulmonary/Chest: Effort normal and breath sounds normal. No respiratory distress. She has no wheezes. She has no rales.  Musculoskeletal: Normal range of motion.  Neurological: She is alert and oriented to person, place, and time.  Skin: Skin is warm and dry. Rash noted.       Generalized hives.  Psychiatric: She has a normal mood and affect.    ED Course  Procedures (including critical care time)  Pt with generalized urtricaria. On predsnisone and benadryl  for two days now. Possible other causes of rash, personal products? Will switch to hypoallergenic products. Will continue treatments will add pepcid. Follow up with PCP. No respiratory distress.  1. Urticaria       MDM          Lottie Mussel, PA 09/27/11 0025

## 2011-09-27 NOTE — ED Provider Notes (Signed)
Medical screening examination/treatment/procedure(s) were performed by non-physician practitioner and as supervising physician I was immediately available for consultation/collaboration.    Nelia Shi, MD 09/27/11 [phone]

## 2011-12-19 ENCOUNTER — Emergency Department (HOSPITAL_COMMUNITY)
Admission: EM | Admit: 2011-12-19 | Discharge: 2011-12-19 | Disposition: A | Discharge status: Home / Self Care | Payer: PRIVATE HEALTH INSURANCE | Attending: Emergency Medicine | Admitting: Emergency Medicine

## 2011-12-19 ENCOUNTER — Emergency Department (HOSPITAL_COMMUNITY): Payer: PRIVATE HEALTH INSURANCE

## 2011-12-19 ENCOUNTER — Encounter (HOSPITAL_COMMUNITY): Payer: Self-pay | Admitting: *Deleted

## 2011-12-19 DIAGNOSIS — R0789 Other chest pain: Secondary | ICD-10-CM | POA: Insufficient documentation

## 2011-12-19 DIAGNOSIS — M549 Dorsalgia, unspecified: Secondary | ICD-10-CM | POA: Insufficient documentation

## 2011-12-19 DIAGNOSIS — R0781 Pleurodynia: Secondary | ICD-10-CM

## 2011-12-19 DIAGNOSIS — R059 Cough, unspecified: Secondary | ICD-10-CM | POA: Insufficient documentation

## 2011-12-19 DIAGNOSIS — R05 Cough: Secondary | ICD-10-CM | POA: Insufficient documentation

## 2011-12-19 DIAGNOSIS — J45909 Unspecified asthma, uncomplicated: Secondary | ICD-10-CM | POA: Insufficient documentation

## 2011-12-19 DIAGNOSIS — I10 Essential (primary) hypertension: Secondary | ICD-10-CM | POA: Insufficient documentation

## 2011-12-19 LAB — CBC
HCT: 31.8 % — ABNORMAL LOW (ref 36.0–46.0)
Hemoglobin: 9.8 g/dL — ABNORMAL LOW (ref 12.0–15.0)
MCH: 24.1 pg — ABNORMAL LOW (ref 26.0–34.0)
MCV: 78.3 fL (ref 78.0–100.0)
RBC: 4.06 MIL/uL (ref 3.87–5.11)

## 2011-12-19 LAB — BASIC METABOLIC PANEL
CO2: 27 mEq/L (ref 19–32)
Calcium: 8.7 mg/dL (ref 8.4–10.5)
Creatinine, Ser: 0.6 mg/dL (ref 0.50–1.10)
Glucose, Bld: 88 mg/dL (ref 70–99)

## 2011-12-19 LAB — TROPONIN I: Troponin I: <0.3 ng/mL (ref <0.30)

## 2011-12-19 MED ORDER — IPRATROPIUM BROMIDE 0.02 % IN SOLN
0.5000 mg | Freq: Once | RESPIRATORY_TRACT | Status: AC
  Administered 2011-12-19: 0.5 mg via RESPIRATORY_TRACT
  Filled 2011-12-19: qty 2.5

## 2011-12-19 MED ORDER — ASPIRIN 81 MG PO CHEW
324.0000 mg | CHEWABLE_TABLET | Freq: Once | ORAL | Status: AC
  Administered 2011-12-19: 324 mg via ORAL
  Filled 2011-12-19: qty 4

## 2011-12-19 MED ORDER — IBUPROFEN 800 MG PO TABS
800.0000 mg | ORAL_TABLET | Freq: Once | ORAL | Status: AC
  Administered 2011-12-19: 800 mg via ORAL
  Filled 2011-12-19: qty 1

## 2011-12-19 MED ORDER — ALBUTEROL SULFATE (5 MG/ML) 0.5% IN NEBU
5.0000 mg | INHALATION_SOLUTION | Freq: Once | RESPIRATORY_TRACT | Status: AC
  Administered 2011-12-19: 5 mg via RESPIRATORY_TRACT
  Filled 2011-12-19: qty 1

## 2011-12-19 MED ORDER — OXYCODONE-ACETAMINOPHEN 5-325 MG PO TABS: 1.0000 | ORAL_TABLET | Freq: Four times a day (QID) | ORAL | Status: AC | PRN

## 2011-12-19 MED ORDER — ONDANSETRON HCL 4 MG/2ML IJ SOLN
4.0000 mg | Freq: Once | INTRAMUSCULAR | Status: DC
  Filled 2011-12-19: qty 2

## 2011-12-19 MED ORDER — MORPHINE SULFATE 4 MG/ML IJ SOLN
4.0000 mg | Freq: Once | INTRAMUSCULAR | Status: DC
  Filled 2011-12-19: qty 1

## 2011-12-19 NOTE — ED Notes (Signed)
Patient returned from X-ray 

## 2011-12-19 NOTE — ED Provider Notes (Signed)
History     CSN: [id number]  Arrival date & time 12/19/11  1617   First MD Initiated Contact with Patient 12/19/11 1830      Chief Complaint  Patient presents with  . Chest Pain  . Back Pain    (Consider location/radiation/quality/duration/timing/severity/associated sxs/prior treatment) HPI  Patient presents to the ED for complaints of chest tightness and back tightness for 4 days constant. She does have a history of asthma and has had a dry cough recently. She has been taking breathing treatments at home but it has not been helping. She denies taking her BP medications as she has been instructed because she says that the medication gives her migraines. She also has been burping a lot and took tums but it did not help the pressure like feeling either. VSS, patients blood pressure is elevated at 160/ 80.   Past Medical History  Diagnosis Date  . Asthma   . Hypertension     Past Surgical History  Procedure Date  . Combined hysteroscopy diagnostic / d&c   . Dilation and curettage of uterus     Family History  Problem Relation Age of Onset  . Hypertension Other   . Diabetes Other   . Cancer Other     History  Substance Use Topics  . Smoking status: Never Smoker   . Smokeless tobacco: Not on file  . Alcohol Use: No    OB History    Grav Para Term Preterm Abortions TAB SAB Ect Mult Living                  Review of Systems   HEENT: denies blurry vision or change in hearing PULMONARY: Denies difficulty breathing and SOB CARDIAC: denies  heart palpitations MUSCULOSKELETAL:  denies being unable to ambulate ABDOMEN AL: denies abdominal pain GU: denies loss of bowel or urinary control NEURO: denies numbness and tingling in extremities   Allergies  Codeine; Penicillins; and Shellfish allergy  Home Medications   Current Outpatient Rx  Name Route Sig Dispense Refill  . ACETAMINOPHEN 500 MG PO TABS Oral Take 1,000 mg by mouth every 6 (six) hours as needed. For  pain.    . ALBUTEROL SULFATE HFA 108 (90 BASE) MCG/ACT IN AERS Inhalation Inhale 2 puffs into the lungs every 4 (four) hours as needed. For cough/wheeze.    Marland Kitchen CALCIUM CARBONATE ANTACID 500 MG PO CHEW Oral Chew 2 tablets by mouth 3 (three) times daily as needed. For indigestion.    Marland Kitchen DIPHENHYDRAMINE HCL 25 MG PO CAPS Oral Take 50 mg by mouth every 6 (six) hours as needed. Itching.    Marland Kitchen BACITRACIN-NEOMYCIN-POLYMYXIN [id number] EX OINT Topical Apply 1 application topically 2 (two) times daily as needed. Apply to rash on back of neck.    . OXYCODONE-ACETAMINOPHEN 5-325 MG PO TABS Oral Take 1 tablet by mouth every 6 (six) hours as needed for pain. 8 tablet 0    BP 140/74  Pulse 70  Temp(Src) 98.8 F (37.1 C) (Oral)  Resp 21  SpO2 100%  LMP 12/16/2011  Physical Exam  Nursing note and vitals reviewed. Constitutional: She appears well-developed and well-nourished. No distress.  HENT:  Head: Normocephalic and atraumatic.  Eyes: Pupils are equal, round, and reactive to light.  Neck: Normal range of motion. Neck supple.  Cardiovascular: Normal rate and regular rhythm.   Pulmonary/Chest: Effort normal. She has no wheezes. She has no rales.  Abdominal: Soft.  Neurological: She is alert.  Skin: Skin is warm and  dry.    ED Course  Procedures (including critical care time)  Labs Reviewed  CBC - Abnormal; Notable for the following:    Hemoglobin 9.8 (*)    HCT 31.8 (*)    MCH 24.1 (*)    All other components within normal limits  TROPONIN I  BASIC METABOLIC PANEL   Dg Chest 2 View  12/19/2011  *RADIOLOGY REPORT*  Clinical Data: Chest pain and shortness of breath.  CHEST - 2 VIEW  Comparison: 09/03/2009  Findings: Mild cardiomegaly noted.  The lungs appear clear.  No pleural effusion is observed.  IMPRESSION:  1.  Mild cardiomegaly.   Otherwise, no significant abnormality identified.  Original Report Authenticated By: Dellia Cloud, M.D.     1. Pleuritic chest pain       MDM     Date: 12/19/2011  Rate: 76  Rhythm: normal sinus rhythm  QRS Axis: normal  Intervals: normal  ST/T Wave abnormalities: normal  Conduction Disutrbances:none  Narrative Interpretation:   Old EKG Reviewed: unchanged from October 21, 2002   The patient does not meet any WELLS criteria to suggest PE. Her pain has been controlled in the ED.  After discussing patient with Dr. Rubin Payor the patient is at low risk for cardiac event. I will give her a referral to Pulmonolgy.  Pt has been advised of the symptoms that warrant their return to the ED. Patient has voiced understanding and has agreed to follow-up with the PCP or specialist.     Dorthula Matas, PA 12/19/11 [id number]

## 2011-12-19 NOTE — ED Provider Notes (Signed)
Medical screening examination/treatment/procedure(s) were performed by non-physician practitioner and as supervising physician I was immediately available for consultation/collaboration.  Aydyn Testerman R. Zoria Rawlinson, MD 12/19/11 2342 

## 2011-12-19 NOTE — Discharge Instructions (Signed)
Chest Wall Pain Chest wall pain is pain in or around the bones and muscles of your chest. It may take up to 6 weeks to get better. It may take longer if you must stay physically active in your work and activities.  CAUSES  Chest wall pain may happen on its own. However, it may be caused by:  A viral illness like the flu.   Injury.   Coughing.   Exercise.   Arthritis.   Fibromyalgia.   Shingles.  HOME CARE INSTRUCTIONS   Avoid overtiring physical activity. Try not to strain or perform activities that cause pain. This includes any activities using your chest or your abdominal and side muscles, especially if heavy weights are used.   Put ice on the sore area.   Put ice in a plastic bag.   Place a towel between your skin and the bag.   Leave the ice on for 15 to 20 minutes per hour while awake for the first 2 days.   Only take over-the-counter or prescription medicines for pain, discomfort, or fever as directed by your caregiver.  SEEK IMMEDIATE MEDICAL CARE IF:   Your pain increases, or you are very uncomfortable.   You have a fever.   Your chest pain becomes worse.   You have new, unexplained symptoms.   You have nausea or vomiting.   You feel sweaty or lightheaded.   You have a cough with phlegm (sputum), or you cough up blood.  MAKE SURE YOU:   Understand these instructions.   Will watch your condition.   Will get help right away if you are not doing well or get worse.  Document Released: 07/16/2005 Document Revised: 07/05/2011 Document Reviewed: 03/12/2011 Exit[employer] Patient Information 2012 [employer]. 

## 2011-12-19 NOTE — ED Notes (Signed)
Pt has Hx of HTN, has been on 2 different meds for same which have both caused migraines; she is currently not taking any BP meds

## 2011-12-19 NOTE — ED Notes (Signed)
PA Green at bedside.

## 2011-12-19 NOTE — ED Notes (Signed)
Pt reports L chest tightness and L side neck pain that radiate down to her L arm x 4 days.  Pt reports hx of asthma.  Pt also has dry cough.  Pt also reports that she took BP  Med in the past for 2 years and has not had to take it since.

## 2011-12-19 NOTE — ED Notes (Signed)
Patient transported to X-ray 

## 2011-12-29 DIAGNOSIS — Z8669 Personal history of other diseases of the nervous system and sense organs: Secondary | ICD-10-CM

## 2011-12-29 HISTORY — DX: Personal history of other diseases of the nervous system and sense organs: Z86.69

## 2012-01-27 ENCOUNTER — Encounter (HOSPITAL_COMMUNITY): Payer: Self-pay | Admitting: Emergency Medicine

## 2012-01-27 ENCOUNTER — Emergency Department (HOSPITAL_COMMUNITY)
Admission: EM | Admit: 2012-01-27 | Discharge: 2012-01-27 | Disposition: A | Discharge status: Home / Self Care | Payer: PRIVATE HEALTH INSURANCE | Attending: Emergency Medicine | Admitting: Emergency Medicine

## 2012-01-27 DIAGNOSIS — I10 Essential (primary) hypertension: Secondary | ICD-10-CM | POA: Insufficient documentation

## 2012-01-27 DIAGNOSIS — J45909 Unspecified asthma, uncomplicated: Secondary | ICD-10-CM | POA: Insufficient documentation

## 2012-01-27 DIAGNOSIS — H6690 Otitis media, unspecified, unspecified ear: Secondary | ICD-10-CM | POA: Insufficient documentation

## 2012-01-27 DIAGNOSIS — H6692 Otitis media, unspecified, left ear: Secondary | ICD-10-CM

## 2012-01-27 MED ORDER — AZITHROMYCIN 250 MG PO TABS: 250.0000 mg | ORAL_TABLET | Freq: Every day | ORAL | Status: AC

## 2012-01-27 MED ORDER — ANTIPYRINE-BENZOCAINE 5.4-1.4 % OT SOLN
3.0000 [drp] | Freq: Once | OTIC | Status: AC
  Administered 2012-01-27: 3 [drp] via OTIC
  Filled 2012-01-27: qty 10

## 2012-01-27 NOTE — Discharge Instructions (Signed)
Otitis Media, Adult  A middle ear infection is an infection in the space behind the eardrum. The medical name for this is "otitis media." It may happen after a common cold. It is caused by a germ that starts growing in that space. You may feel swollen glands in your neck on the side of the ear infection.  HOME CARE INSTRUCTIONS   · Take your medicine as directed until it is gone, even if you feel better after the first few days.  · Only take over-the-counter or prescription medicines for pain, discomfort, or fever as directed by your caregiver.  · Occasional use of a nasal decongestant a couple times per day may help with discomfort and help the eustachian tube to drain better.  Follow up with your caregiver in 10 to 14 days or as directed, to be certain that the infection has cleared. Not keeping the appointment could result in a chronic or permanent injury, pain, hearing loss and disability. If there is any problem keeping the appointment, you must call back to this facility for assistance.  SEEK IMMEDIATE MEDICAL CARE IF:   · You are not getting better in 2 to 3 days.  · You have pain that is not controlled with medication.  · You feel worse instead of better.  · You cannot use the medication as directed.  · You develop swelling, redness or pain around the ear or stiffness in your neck.  MAKE SURE YOU:   · Understand these instructions.  · Will watch your condition.  · Will get help right away if you are not doing well or get worse.  Document Released: 04/20/2004 Document Revised: 07/05/2011 Document Reviewed: 02/20/2008  ExitCare® Patient Information ©2012 [employer].

## 2012-01-27 NOTE — ED Provider Notes (Signed)
History     CSN: [id number]  Arrival date & time 01/27/12  1555   First MD Initiated Contact with Patient 01/27/12 1635      Chief Complaint  Patient presents with  . Otalgia    (Consider location/radiation/quality/duration/timing/severity/associated sxs/prior treatment) HPI  Presents to the emergency department complaints of left ear pain and sore throat since Wednesday. She states that her throat has very begun to get better but her ear is hurting worse. She denies recent swimming or frequent ear infections. She denies fevers, nausea, vomiting, diarrhea, abdominal pain. Her vital signs are stable and she is in no acute distress.  Past Medical History  Diagnosis Date  . Asthma   . Hypertension     Past Surgical History  Procedure Date  . Combined hysteroscopy diagnostic / d&c   . Dilation and curettage of uterus   . Dilation and curettage of uterus     Family History  Problem Relation Age of Onset  . Hypertension Other   . Diabetes Other   . Cancer Other     History  Substance Use Topics  . Smoking status: Never Smoker   . Smokeless tobacco: Not on file  . Alcohol Use: No    OB History    Grav Para Term Preterm Abortions TAB SAB Ect Mult Living                  Review of Systems   HEENT: denies blurry vision or change in hearing PULMONARY: Denies difficulty breathing and SOB CARDIAC: denies chest pain or heart palpitations MUSCULOSKELETAL:  denies being unable to ambulate ABDOMEN AL: denies abdominal pain GU: denies loss of bowel or urinary control NEURO: denies numbness and tingling in extremities SKIN: no new rashes    Allergies  Codeine; Penicillins; and Shellfish allergy  Home Medications   Current Outpatient Rx  Name Route Sig Dispense Refill  . ALBUTEROL SULFATE HFA 108 (90 BASE) MCG/ACT IN AERS Inhalation Inhale 2 puffs into the lungs every 4 (four) hours as needed. For cough/wheeze.    Marland Kitchen CALCIUM CARBONATE ANTACID 500 MG PO CHEW Oral  Chew 2 tablets by mouth 3 (three) times daily as needed. For indigestion.    . GUAIFENESIN ER 600 MG PO TB12 Oral Take 1,200 mg by mouth 2 (two) times daily.    Marland Kitchen LOSARTAN POTASSIUM 25 MG PO TABS Oral Take 25 mg by mouth at bedtime.    Marland Kitchen PHENYLEPH-DOXYLAMINE-DM-APAP 5-6.25-10-325 MG PO CAPS Oral Take 1 tablet by mouth 2 (two) times daily.    . AZITHROMYCIN 250 MG PO TABS Oral Take 1 tablet (250 mg total) by mouth daily. Take first 2 tablets together, then 1 every day until finished. 6 tablet 0    2 tabs PO on day one and then 1 tab PO for the nex ...    BP 145/85  Pulse 81  Temp 98.7 F (37.1 C) (Oral)  Resp 19  SpO2 100%  LMP 12/29/2011  Physical Exam  Nursing note and vitals reviewed. Constitutional: She is oriented to person, place, and time. She appears well-developed and well-nourished. No distress.  HENT:  Head: Normocephalic and atraumatic. No trismus in the jaw.  Right Ear: Tympanic membrane, external ear and ear canal normal.  Left Ear: External ear and ear canal normal. Tympanic membrane is erythematous and bulging.  Nose: Nose normal. No rhinorrhea. Right sinus exhibits no maxillary sinus tenderness and no frontal sinus tenderness. Left sinus exhibits no maxillary sinus tenderness and no frontal  sinus tenderness.  Mouth/Throat: Uvula is midline and mucous membranes are normal. Normal dentition. No dental abscesses or uvula swelling. No posterior oropharyngeal erythema or tonsillar abscesses.       No submental edema, tongue not elevated, no trismus. No impending airway obstruction; Pt able to speak full sentences, swallow intact, no drooling, stridor, or tonsillar/uvula displacement. No palatal petechia  Eyes: Conjunctivae are normal.  Neck: Trachea normal, normal range of motion and full passive range of motion without pain. Neck supple. No rigidity. Erythema present. Normal range of motion present. No Brudzinski's sign noted.       Flexion and extension of neck without pain  or difficulty. Able to breath without difficulty in extension.  Cardiovascular: Normal rate and regular rhythm.   Pulmonary/Chest: Effort normal and breath sounds normal. No stridor. No respiratory distress. She has no wheezes.  Abdominal: Soft. There is no tenderness.       No obvious evidence of splenomegaly. Non ttp.   Musculoskeletal: Normal range of motion.  Lymphadenopathy:       Head (right side): No preauricular and no posterior auricular adenopathy present.       Head (left side): No preauricular and no posterior auricular adenopathy present.  Neurological: She is alert and oriented to person, place, and time.  Skin: Skin is warm and dry. No rash noted. She is not diaphoretic.  Psychiatric: She has a normal mood and affect.    ED Course  Procedures (including critical care time)  Labs Reviewed - No data to display No results found.   1. Otitis media of left ear       MDM  Pt allergic to penicillin therefore azithromycin was given for her otitis media to the right ear. Pt requesting pain medication, Auralgan given for ear pain.   Pt has been advised of the symptoms that warrant their return to the ED. Patient has voiced understanding and has agreed to follow-up with the PCP or specialist.         Dorthula Matas, PA 01/27/12 1657

## 2012-01-27 NOTE — ED Notes (Signed)
Left ear pain, started yesterday, after sore throat that started Wednesday.

## 2012-01-28 NOTE — ED Provider Notes (Signed)
Medical screening examination/treatment/procedure(s) were performed by non-physician practitioner and as supervising physician I was immediately available for consultation/collaboration.  Jordynn Perrier, MD 01/28/12 0038 

## 2012-01-29 ENCOUNTER — Emergency Department (HOSPITAL_COMMUNITY)
Admission: EM | Admit: 2012-01-29 | Discharge: 2012-01-29 | Disposition: A | Discharge status: Home / Self Care | Payer: PRIVATE HEALTH INSURANCE | Attending: Emergency Medicine | Admitting: Emergency Medicine

## 2012-01-29 ENCOUNTER — Encounter (HOSPITAL_COMMUNITY): Payer: Self-pay | Admitting: *Deleted

## 2012-01-29 DIAGNOSIS — I10 Essential (primary) hypertension: Secondary | ICD-10-CM | POA: Insufficient documentation

## 2012-01-29 DIAGNOSIS — J45909 Unspecified asthma, uncomplicated: Secondary | ICD-10-CM | POA: Insufficient documentation

## 2012-01-29 DIAGNOSIS — G510 Bell's palsy: Secondary | ICD-10-CM

## 2012-01-29 MED ORDER — PREDNISONE 20 MG PO TABS
60.0000 mg | ORAL_TABLET | Freq: Once | ORAL | Status: AC
  Administered 2012-01-29: 60 mg via ORAL
  Filled 2012-01-29: qty 3

## 2012-01-29 MED ORDER — PREDNISONE 20 MG PO TABS: ORAL_TABLET | ORAL | Status: DC

## 2012-01-29 MED ORDER — ACYCLOVIR 200 MG PO CAPS
400.0000 mg | ORAL_CAPSULE | Freq: Once | ORAL | Status: AC
  Administered 2012-01-29: 400 mg via ORAL
  Filled 2012-01-29: qty 2

## 2012-01-29 MED ORDER — ACYCLOVIR 400 MG PO TABS: ORAL_TABLET | ORAL | Status: DC

## 2012-01-29 NOTE — ED Provider Notes (Signed)
History     CSN: [id number]  Arrival date & time 01/29/12  1127   First MD Initiated Contact with Patient 01/29/12 1133      Chief Complaint  Patient presents with  . Otalgia  . Facial Droop    (Consider location/radiation/quality/duration/timing/severity/associated sxs/prior treatment) The history is provided by the patient and medical records.  pt c/o left ear pain since 6/30 and now has numbness and is unable to move the left side of her face.  No other sxs.   No ha, weakness, n/v/vision changes.  Past Medical History  Diagnosis Date  . Asthma   . Hypertension     Past Surgical History  Procedure Date  . Combined hysteroscopy diagnostic / d&c   . Dilation and curettage of uterus   . Dilation and curettage of uterus     Family History  Problem Relation Age of Onset  . Hypertension Other   . Diabetes Other   . Cancer Other     History  Substance Use Topics  . Smoking status: Never Smoker   . Smokeless tobacco: Not on file  . Alcohol Use: No    OB History    Grav Para Term Preterm Abortions TAB SAB Ect Mult Living                  Review of Systems  Constitutional: Negative for fever.  HENT: Positive for ear pain. Negative for neck pain.   Eyes: Negative for redness and visual disturbance.  Gastrointestinal: Negative for nausea and vomiting.  Skin: Negative for rash.  Neurological: Positive for weakness and numbness.       Left facial numbness and weakness No arm or leg weakness  Psychiatric/Behavioral: Negative for confusion.  All other systems reviewed and are negative.    Allergies  Codeine; Penicillins; and Shellfish allergy  Home Medications   Current Outpatient Rx  Name Route Sig Dispense Refill  . ACYCLOVIR 400 MG PO TABS  Take 1 tablet by mouth 5 times per day for 10 days 50 tablet 0  . ALBUTEROL SULFATE HFA 108 (90 BASE) MCG/ACT IN AERS Inhalation Inhale 2 puffs into the lungs every 4 (four) hours as needed. For cough/wheeze.    Marland Kitchen  AZITHROMYCIN 250 MG PO TABS Oral Take 1 tablet (250 mg total) by mouth daily. Take first 2 tablets together, then 1 every day until finished. 6 tablet 0    2 tabs PO on day one and then 1 tab PO for the nex ...  . CALCIUM CARBONATE ANTACID 500 MG PO CHEW Oral Chew 2 tablets by mouth 3 (three) times daily as needed. For indigestion.    . GUAIFENESIN ER 600 MG PO TB12 Oral Take 1,200 mg by mouth 2 (two) times daily.    Marland Kitchen LOSARTAN POTASSIUM 25 MG PO TABS Oral Take 25 mg by mouth at bedtime.    Marland Kitchen PHENYLEPH-DOXYLAMINE-DM-APAP 5-6.25-10-325 MG PO CAPS Oral Take 1 tablet by mouth 2 (two) times daily.    Marland Kitchen PREDNISONE 20 MG PO TABS  Take 3 tablets by mouth for 5 days and then 2 tablets by mouth for 5 more days 25 tablet 0    BP 139/63  Pulse 85  Temp 98.3 F (36.8 C) (Oral)  Resp 18  SpO2 100%  LMP 12/29/2011  Physical Exam  Vitals reviewed. Constitutional: She is oriented to person, place, and time. She appears well-developed and well-nourished. No distress.  HENT:  Head: Normocephalic and atraumatic.  Eyes: Conjunctivae and EOM are  normal.  Neck: Normal range of motion.  Pulmonary/Chest: Effort normal.  Abdominal: She exhibits no distension.  Musculoskeletal: Normal range of motion.  Neurological: She is alert and oriented to person, place, and time. No cranial nerve deficit.       CN 7 deficit on left.  Pt cannot raise eyebrow/wrinkle forehead on left.  She has asymmetric smile affecting left side of face.  + droop on left with loss of nasolabial fold.   Skin: Skin is warm and dry.  Psychiatric: She has a normal mood and affect. Thought content normal.    ED Course  Procedures (including critical care time)  Labs Reviewed - No data to display No results found.   1. Bell's palsy       MDM  Bell's palsy.  No signs of stroke or systemic illness.         Cheri Guppy, MD 01/29/12 1215

## 2012-01-29 NOTE — ED Notes (Signed)
Pt states " Last night I just started to feel funny, my lips will not close together." pt is unsure of the time

## 2012-01-29 NOTE — ED Notes (Signed)
Pt states she came for an earache and started taking the ear drops. Pt states last night she noticed left side facial drop and headache with pain radiating to her ear. Pt is able to move all extremities to command

## 2012-02-25 ENCOUNTER — Ambulatory Visit (INDEPENDENT_AMBULATORY_CARE_PROVIDER_SITE_OTHER): Payer: PRIVATE HEALTH INSURANCE | Admitting: Family

## 2012-02-25 ENCOUNTER — Encounter: Payer: Self-pay | Admitting: Family

## 2012-02-25 VITALS — BP 138/88 | HR 94 | Temp 98.5°F | Ht 66.0 in | Wt 330.1 lb

## 2012-02-25 DIAGNOSIS — Z01419 Encounter for gynecological examination (general) (routine) without abnormal findings: Secondary | ICD-10-CM

## 2012-02-25 DIAGNOSIS — Z8669 Personal history of other diseases of the nervous system and sense organs: Secondary | ICD-10-CM

## 2012-02-25 DIAGNOSIS — N898 Other specified noninflammatory disorders of vagina: Secondary | ICD-10-CM

## 2012-02-25 DIAGNOSIS — I10 Essential (primary) hypertension: Secondary | ICD-10-CM | POA: Insufficient documentation

## 2012-02-25 NOTE — Progress Notes (Signed)
  Subjective:    Patient ID: Molly Archer, female    DOB: 1980/10/18, 31 y.o.   MRN: [id number]  HPI Pt is here with report of vaginal bumps that appears prior to cycle for past two months.  +itching, no odor or abnormal vaginal discharge.  No lesions at today's visit.  Last pap smear in 2010.   Review of Systems  Genitourinary:       Vaginal lesion itching  All other systems reviewed and are negative.       Objective:   Physical Exam  Constitutional: She is oriented to person, place, and time. She appears well-developed and well-nourished. No distress.  HENT:  Head: Normocephalic.  Neck: Normal range of motion. Neck supple.  Cardiovascular: Normal rate and regular rhythm.   Pulmonary/Chest: Effort normal and breath sounds normal.  Genitourinary: There is no rash or lesion on the right labia. There is no rash or lesion on the left labia. Cervix exhibits no motion tenderness, no discharge and no friability. Vaginal discharge (whitish brown creamy discharge) found.  Musculoskeletal: Normal range of motion.  Neurological: She is alert and oriented to person, place, and time.  Skin: Skin is warm and dry.          Assessment & Plan:  Vaginal Lesion  Plan: Pap smear GC/CT Wet prep Advised pt to return when lesion is present.

## 2012-02-28 ENCOUNTER — Encounter: Payer: Self-pay | Admitting: Family

## 2012-02-28 DIAGNOSIS — IMO0002 Reserved for concepts with insufficient information to code with codable children: Secondary | ICD-10-CM | POA: Insufficient documentation

## 2012-02-28 DIAGNOSIS — B977 Papillomavirus as the cause of diseases classified elsewhere: Secondary | ICD-10-CM | POA: Insufficient documentation

## 2012-03-10 ENCOUNTER — Telehealth: Payer: Self-pay | Admitting: Family

## 2012-03-10 DIAGNOSIS — IMO0002 Reserved for concepts with insufficient information to code with codable children: Secondary | ICD-10-CM

## 2012-03-10 MED ORDER — METRONIDAZOLE 500 MG PO TABS: 500.0000 mg | ORAL_TABLET | Freq: Two times a day (BID) | ORAL | Status: AC

## 2012-03-10 MED ORDER — METRONIDAZOLE 500 MG PO TABS: 500.0000 mg | ORAL_TABLET | Freq: Two times a day (BID) | ORAL | Status: DC

## 2012-03-10 NOTE — Telephone Encounter (Addendum)
Pt reported vaginal discharge at last visit; wet prep with +clue>RX to pharmacy for Flagyl 500 mg BID x 7 days.  Need pharmacy information.  Pt returned call; RX sent to [facility] on [address].  Pt will call and schedule appt for colposcopy.

## 2012-03-20 ENCOUNTER — Telehealth: Payer: Self-pay | Admitting: *Deleted

## 2012-03-20 NOTE — Telephone Encounter (Signed)
Pt left a message stating that she is having burning and irritation in her vagina. Wanted to know what she should do.

## 2012-03-20 NOTE — Telephone Encounter (Signed)
Called patient on cell number and left her a voicemail to call us back. Also called her work number but went directly to a Clinical research associate, did not leave a message.

## 2012-03-24 NOTE — Telephone Encounter (Signed)
Called pt and pt informed me that she got someone to call in some cream and that she is fine now.  Pt did not have any further questions.

## 2012-04-14 ENCOUNTER — Encounter: Payer: Self-pay | Admitting: Physician Assistant

## 2012-04-14 ENCOUNTER — Ambulatory Visit (INDEPENDENT_AMBULATORY_CARE_PROVIDER_SITE_OTHER): Payer: BC Managed Care – PPO | Admitting: Physician Assistant

## 2012-04-14 ENCOUNTER — Other Ambulatory Visit (HOSPITAL_COMMUNITY)
Admission: RE | Admit: 2012-04-14 | Discharge: 2012-04-14 | Disposition: A | Discharge status: Home / Self Care | Payer: BC Managed Care – PPO | Source: Ambulatory Visit | Attending: Physician Assistant | Admitting: Physician Assistant

## 2012-04-14 VITALS — BP 145/82 | HR 85 | Temp 98.0°F | Ht 65.0 in | Wt 329.7 lb

## 2012-04-14 DIAGNOSIS — N879 Dysplasia of cervix uteri, unspecified: Secondary | ICD-10-CM | POA: Insufficient documentation

## 2012-04-14 DIAGNOSIS — R87612 Low grade squamous intraepithelial lesion on cytologic smear of cervix (LGSIL): Secondary | ICD-10-CM

## 2012-04-14 DIAGNOSIS — IMO0002 Reserved for concepts with insufficient information to code with codable children: Secondary | ICD-10-CM

## 2012-04-14 LAB — POCT PREGNANCY, URINE: Preg Test, Ur: NEGATIVE

## 2012-04-14 NOTE — Patient Instructions (Addendum)
Loop Electrosurgical Excision Procedure Loop electrosurgical excision procedure (LEEP) is the removal of a portion of the lower part of the uterus (cervix). The procedure is done when there are significantly abnormal cervical cell changes. Abnormal cell changes of the cervix can lead to cancer if left in place and untreated.  The LEEP procedure itself typically only takes a few minutes. Often, it may be done in your caregiver's office. The procedure is considered safe for those who wish to get pregnant or are trying to get pregnant. Only under rare circumstances should this procedure be done if you are pregnant. LET YOUR CAREGIVER KNOW ABOUT:  Whether you are pregnant or late for your last menstrual period.   Allergies to foods or medicines.   All the medicines you are taking includingherbs, eyedrops, and over-the-counter medicines, and creams.   Use of steroids (by mouth or creams).   Previous problems with anesthetics or numbing medicine.   Previous gynecological surgery.   History of blood clots or bleeding problems.   Any recent or current vaginal infections (herpes, sexually transmitted infections).   Other health problems.  RISKS AND COMPLICATIONS  Bleeding.   Infection.   Injury to the vagina, bladder, or rectum.   Very rare obstruction of the cervical opening that causes problems during menstruation (cervical stenosis).  BEFORE THE PROCEDURE  Do not take aspirin or blood thinners (anticoagulants) for 1 week before the procedure, or as told by your caregiver.   Eat a light meal before the procedure.   Ask your caregiver about changing or stopping your regular medicines.   You may be given a pain reliever 1 or 2 hours before the procedure.  PROCEDURE   A tool (speculum) is placed in the vagina. This allows your caregiver to see the cervix.   An iodine stain is applied to the cervix to find the area of abnormal cells to be removed.   Medicine is injected to numb  the cervix (local anesthetic).    Electricity is passed through a thin wire loop which is then used to remove (cauterize) a small segment of the affected cervix.   Light electrocautery is used to seal any small blood vessels and prevent bleeding.   A paste may be applied to the cauterized area of the cervix to help prevent bleeding.   The tissue sample is sent to the lab. It is examined under the microscope.  AFTER THE PROCEDURE  Have someone drive you home.   You may have slight to moderate cramping.   You may notice a black vaginal discharge from the paste used on the cervix to prevent bleeding. This is normal.   Watch for excessive bleeding. This requires immediate medical care.   Ask when your test results will be ready. Make sure you get your test results.  Document Released: 10/06/2002 Document Revised: 07/05/2011 Document Reviewed: 12/26/2010 [facility] Patient Information 2012 [place], [place].Cryosurgery Cryosurgery is used to remove abnormal or diseased tissue by freezing. Growths on the skin such as warts, moles, and some kinds of skin cancer may be removed with cryosurgery. A probe cooled with liquid nitrogen is applied to the growth until it is frozen and destroyed. Another method is spraying liquid nitrogen directly on the growth. Shortly after the procedure the treated area becomes red and swollen. Within two or three days, a blister forms over the area. The blister may contain a small amount of blood. The blister will break by itself in about two weeks, leaving a scab and then  healing. After healing there is usually little or no scarring. HOME CARE INSTRUCTIONS   Keep the treated area dry and clean and covered with a bandage until healed. It is okay to shower, but if the bandage gets wet, change it promptly.   After the blister forms, do not pick at it or try to break it open. This may cause infection and scarring.   Do not apply any medicine, creams or lotions to the area  unless directed.  SEEK IMMEDIATE MEDICAL CARE IF:   You have increased pain, swelling, inflammation (redness), drainage, or bleeding from the treated area.   The blister on the treated area becomes large and painful.  Document Released: 07/13/2000 Document Revised: 07/05/2011 Document Reviewed: 06/25/2011 [facility] Patient Information 2012 [place], [place].Colposcopy Care After Colposcopy is a procedure in which a special tool is used to magnify the surface of the cervix. A tissue sample (biopsy) may also be taken. This sample will be looked at for cervical cancer or other problems. After the test:  You may have some cramping.   Lie down for a few minutes if you feel lightheaded.    You may have some bleeding which should stop in a few days.  HOME CARE  Do not have sex or use tampons for 2 to 3 days or as told.   Only take medicine as told by your doctor.   Continue to take your birth control pills as usual.  Finding out the results of your test Ask when your test results will be ready. Make sure you get your test results. GET HELP RIGHT AWAY IF:  You are bleeding a lot or are passing blood clots.   You develop a fever of 102 F (38.9 C) or higher.   You have abnormal vaginal discharge.   You have cramps that do not go away with medicine.   You feel lightheaded, dizzy, or pass out (faint).  MAKE SURE YOU:   Understand these instructions.   Will watch your condition.   Will get help right away if you are not doing well or get worse.  Document Released: 01/02/2008 Document Revised: 07/05/2011 Document Reviewed: 01/02/2008 [facility] Patient Information 2012 [place], [place].

## 2012-04-14 NOTE — Progress Notes (Signed)
Referred from colpo by Sid Falcon for LSIL pap with +HPV. Patient given informed consent, signed copy in the chart, time out was performed.  Placed in lithotomy position. Cervix viewed with speculum and colposcope after application of acetic acid.   Colposcopy adequate?  No Acetowhite lesions?Yes Punctation?Yes Mosaicism?  No Abnormal vasculature?  Yes Biopsies?1 o'clock, 5 o'clock ECC?Yes  COMMENTS:Suspect higher grade lesion. Reviewed with patient cryo vs LEEP depending on pathology. Will call with report as available. Plan procedure at next visit. Patient was given post procedure instructions.  She will return in 2 weeks for results.

## 2012-04-16 ENCOUNTER — Telehealth: Payer: Self-pay

## 2012-04-16 NOTE — Telephone Encounter (Signed)
Called pt and left message to return our call to the clinics.  

## 2012-04-16 NOTE — Telephone Encounter (Signed)
Message copied by Faythe Casa on Wed Apr 16, 2012  2:35 PM ------      Message from: Maylon Cos E      Created: Wed Apr 16, 2012  2:04 PM       HPV, low grade lesion. Please call pt with result. Inform plan to repeat pap with co-testing in 12 months only. No procedure needed. Thanks

## 2012-04-17 NOTE — Telephone Encounter (Signed)
Spoke with patient this morning, she was informed of the results. Had no further questions.

## 2012-04-27 ENCOUNTER — Emergency Department (HOSPITAL_COMMUNITY)
Admission: EM | Admit: 2012-04-27 | Discharge: 2012-04-27 | Disposition: A | Discharge status: Home / Self Care | Payer: BC Managed Care – PPO | Attending: Emergency Medicine | Admitting: Emergency Medicine

## 2012-04-27 ENCOUNTER — Encounter (HOSPITAL_COMMUNITY): Payer: Self-pay | Admitting: *Deleted

## 2012-04-27 DIAGNOSIS — R05 Cough: Secondary | ICD-10-CM | POA: Insufficient documentation

## 2012-04-27 DIAGNOSIS — R5381 Other malaise: Secondary | ICD-10-CM | POA: Insufficient documentation

## 2012-04-27 DIAGNOSIS — J069 Acute upper respiratory infection, unspecified: Secondary | ICD-10-CM

## 2012-04-27 DIAGNOSIS — R51 Headache: Secondary | ICD-10-CM | POA: Insufficient documentation

## 2012-04-27 DIAGNOSIS — J45909 Unspecified asthma, uncomplicated: Secondary | ICD-10-CM | POA: Insufficient documentation

## 2012-04-27 DIAGNOSIS — R5383 Other fatigue: Secondary | ICD-10-CM | POA: Insufficient documentation

## 2012-04-27 DIAGNOSIS — R059 Cough, unspecified: Secondary | ICD-10-CM | POA: Insufficient documentation

## 2012-04-27 DIAGNOSIS — I10 Essential (primary) hypertension: Secondary | ICD-10-CM | POA: Insufficient documentation

## 2012-04-27 DIAGNOSIS — J321 Chronic frontal sinusitis: Secondary | ICD-10-CM

## 2012-04-27 MED ORDER — AZITHROMYCIN 250 MG PO TABS: 250.0000 mg | ORAL_TABLET | Freq: Every day | ORAL | Status: DC

## 2012-04-27 NOTE — ED Notes (Signed)
Pt reports cold like symptoms starting last week. Pt reports seeing PCP Wednesday and diagnosed with cold.  Pt reports PCP refilled albuterol inhaler and tested patient for strep which was negative.  Pt also complains of sinus pressure, headache and non-productive cough.  Pt has been using mucinex, alka-seltzer cold and sinus and Ibuprofen for symptoms without relief. Pt has history of asthma. Pt denies wheezing, fevers or shortness of breath.

## 2012-04-27 NOTE — ED Provider Notes (Signed)
History     CSN: [id number]  Arrival date & time 04/27/12  4098   First MD Initiated Contact with Patient 04/27/12 [phone]      Chief Complaint  Patient presents with  . Headache  . URI  . Cough    (Consider location/radiation/quality/duration/timing/severity/associated sxs/prior treatment) Patient is a 31 y.o. female presenting with URI. The history is provided by the patient. No language interpreter was used.  URI The primary symptoms include fatigue, headaches and cough. Primary symptoms do not include fever, ear pain, sore throat, swollen glands, wheezing, abdominal pain, nausea or vomiting. The current episode started more than 1 week ago. The problem has been gradually worsening.  Symptoms associated with the illness include plugged ear sensation, facial pain, sinus pressure, congestion and rhinorrhea. The illness is not associated with chills.   31 year old female coming in today with upper respiratory symptoms x7-10 days. Patient is having facial pain and nasal congestion with a cough. 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 Patient denies fever nausea vomiting neck pain.   Past Medical History  Diagnosis Date  . Asthma   . Hypertension   . Hx of Bell's palsy June 2013    Past Surgical History  Procedure Date  . Combined hysteroscopy diagnostic / d&c   . Dilation and curettage of uterus   . Dilation and curettage of uterus     Family History  Problem Relation Age of Onset  . Hypertension Other   . Diabetes Other   . Cancer Other     History  Substance Use Topics  . Smoking status: Never Smoker   . Smokeless tobacco: Not on file  . Alcohol Use: No    OB History    Grav Para Term Preterm Abortions TAB SAB Ect Mult Living                  Review of Systems  Constitutional: Positive for fatigue. Negative for fever and chills.  HENT: Positive  for congestion, rhinorrhea, postnasal drip and sinus pressure. Negative for ear pain, sore throat, facial swelling and neck pain.   Eyes: Negative.   Respiratory: Positive for cough. Negative for shortness of breath and wheezing.   Cardiovascular: Negative.  Negative for chest pain.  Gastrointestinal: Negative.  Negative for nausea, vomiting and abdominal pain.  Neurological: Positive for headaches.  Psychiatric/Behavioral: Negative.   All other systems reviewed and are negative.    Allergies  Codeine; Penicillins; and Shellfish allergy  Home Medications   Current Outpatient Rx  Name Route Sig Dispense Refill  . ACYCLOVIR 400 MG PO TABS  Take 1 tablet by mouth 5 times per day for 10 days 50 tablet 0  . ALBUTEROL SULFATE HFA 108 (90 BASE) MCG/ACT IN AERS Inhalation Inhale 2 puffs into the lungs every 4 (four) hours as needed. For cough/wheeze.    Marland Kitchen AMLODIPINE BESYLATE 10 MG PO TABS Oral Take 10 mg by mouth daily.    Marland Kitchen CALCIUM CARBONATE ANTACID 500 MG PO CHEW Oral Chew 2 tablets by mouth 3 (three) times daily as needed. For indigestion.    . GUAIFENESIN ER 600 MG PO TB12 Oral Take 1,200 mg by mouth 2 (two) times daily.    Marland Kitchen PHENYLEPH-DOXYLAMINE-DM-APAP 5-6.25-10-325 MG PO CAPS Oral Take 1 tablet by mouth 2 (two) times daily.    Marland Kitchen PREDNISONE 20 MG PO TABS  Take 3 tablets  by mouth for 5 days and then 2 tablets by mouth for 5 more days 25 tablet 0    BP 154/91  Pulse 80  Temp 98.4 F (36.9 C) (Oral)  Resp 24  SpO2 99%  LMP 02/21/2012  Physical Exam  Nursing note and vitals reviewed. Constitutional: She is oriented to person, place, and time. She appears well-developed and well-nourished.  HENT:  Head: Normocephalic and atraumatic.  Right Ear: Tympanic membrane normal.  Left Ear: Tympanic membrane normal.  Nose: Mucosal edema, rhinorrhea and sinus tenderness present. Right sinus exhibits frontal sinus tenderness. Right sinus exhibits no maxillary sinus tenderness. Left sinus  exhibits frontal sinus tenderness. Left sinus exhibits no maxillary sinus tenderness.  Mouth/Throat: Uvula is midline, oropharynx is clear and moist and mucous membranes are normal.  Eyes: Conjunctivae normal and EOM are normal. Pupils are equal, round, and reactive to light.  Neck: Normal range of motion. Neck supple.  Cardiovascular: Normal rate.   Pulmonary/Chest: Effort normal and breath sounds normal. No respiratory distress.  Abdominal: Soft. Bowel sounds are normal. She exhibits no distension.  Musculoskeletal: Normal range of motion. She exhibits no edema and no tenderness.  Neurological: She is alert and oriented to person, place, and time. She has normal reflexes.  Skin: Skin is warm and dry.  Psychiatric: She has a normal mood and affect.    ED Course  Procedures (including critical care time)  Labs Reviewed - No data to display No results found.   No diagnosis found.    MDM  Upper respiratory symptoms x 8-10 days with cough.  Z-pack and follow up with pcp.  Benadryl and motrin over the counter x 24 hours. Patient understands to return for uncontrolled fever nausea vomiting. He will call 13 you needed.        Remi Haggard, NP 04/27/12 1140

## 2012-04-27 NOTE — ED Provider Notes (Signed)
Medical screening examination/treatment/procedure(s) were performed by non-physician practitioner and as supervising physician I was immediately available for consultation/collaboration.  Toria Monte, MD 04/27/12 1607 

## 2012-05-01 ENCOUNTER — Encounter: Payer: BC Managed Care – PPO | Admitting: Obstetrics & Gynecology

## 2012-06-18 ENCOUNTER — Telehealth: Payer: Self-pay

## 2012-06-18 NOTE — Telephone Encounter (Signed)
Pt stated "have questions to ask about an abnormal pap and that I have an infection. The provider told me that it is nothing to be worried about but I'm wondering if I should take extra precautions for sexual activity.  Called pt and pt had questions about having sexual activity I informed pt that the precautions are the same as STD prevention. Pt asked about oral sex- I informed pt that yes dental dam is a great preventive measure to prevent the spread of the virus.  I informed pt that over time HPV can go away but that is another reason for the pap smears to monitor it.  Pt stated "I greatly appreciate your calling and the info".  Pt did not any other questions or concerns.

## 2012-08-23 ENCOUNTER — Emergency Department (HOSPITAL_COMMUNITY)
Admission: EM | Admit: 2012-08-23 | Discharge: 2012-08-23 | Disposition: A | Discharge status: Home / Self Care | Payer: BC Managed Care – PPO | Attending: Emergency Medicine | Admitting: Emergency Medicine

## 2012-08-23 ENCOUNTER — Encounter (HOSPITAL_COMMUNITY): Payer: Self-pay | Admitting: *Deleted

## 2012-08-23 DIAGNOSIS — Z79899 Other long term (current) drug therapy: Secondary | ICD-10-CM | POA: Insufficient documentation

## 2012-08-23 DIAGNOSIS — J019 Acute sinusitis, unspecified: Secondary | ICD-10-CM | POA: Insufficient documentation

## 2012-08-23 DIAGNOSIS — Z8669 Personal history of other diseases of the nervous system and sense organs: Secondary | ICD-10-CM | POA: Insufficient documentation

## 2012-08-23 DIAGNOSIS — J45909 Unspecified asthma, uncomplicated: Secondary | ICD-10-CM | POA: Insufficient documentation

## 2012-08-23 DIAGNOSIS — I10 Essential (primary) hypertension: Secondary | ICD-10-CM | POA: Insufficient documentation

## 2012-08-23 DIAGNOSIS — J3489 Other specified disorders of nose and nasal sinuses: Secondary | ICD-10-CM | POA: Insufficient documentation

## 2012-08-23 MED ORDER — LEVOFLOXACIN 750 MG PO TABS: 750.0000 mg | ORAL_TABLET | Freq: Every day | ORAL | Status: DC

## 2012-08-23 MED ORDER — FLUTICASONE PROPIONATE 50 MCG/ACT NA SUSP: 2.0000 | Freq: Every day | NASAL | Status: DC

## 2012-08-23 NOTE — ED Provider Notes (Signed)
History    This chart was scribed for non-physician practitioner working with Hurman Horn, MD by Smitty Pluck. This patient was seen in room WTR7/WTR7 and the patient's care was started at 8:05 PM.    CSN: [id number]  Arrival date & time 08/23/12  1911      Chief Complaint  Patient presents with  . Cough  . Nasal Congestion     The history is provided by the patient. No language interpreter was used.   Molly Archer is a 32 y.o. female who presents to the Emergency Department complaining of constant, moderate congestion and productive cough with clear sputum onset 3 weeks ago. Pt reports symptoms started with sore throat but the sore throat has subsided. Pt denies fever, chills, nausea, vomiting, diarrhea, abdominal pain, chest pain, SOB and any other pain.   Past Medical History  Diagnosis Date  . Asthma   . Hypertension   . Hx of Bell's palsy June 2013    Past Surgical History  Procedure Date  . Combined hysteroscopy diagnostic / d&c   . Dilation and curettage of uterus   . Dilation and curettage of uterus     Family History  Problem Relation Age of Onset  . Hypertension Other   . Diabetes Other   . Cancer Other     History  Substance Use Topics  . Smoking status: Never Smoker   . Smokeless tobacco: Not on file  . Alcohol Use: No    OB History    Grav Para Term Preterm Abortions TAB SAB Ect Mult Living                  Review of Systems  Constitutional: Negative for fever and chills.  HENT: Positive for congestion.   Respiratory: Positive for cough. Negative for shortness of breath.   Cardiovascular: Negative for chest pain.  Gastrointestinal: Negative for nausea, vomiting, abdominal pain and diarrhea.  All other systems reviewed and are negative.    Allergies  Codeine; Penicillins; and Shellfish allergy  Home Medications   Current Outpatient Rx  Name  Route  Sig  Dispense  Refill  . ALBUTEROL SULFATE HFA 108 (90 BASE) MCG/ACT IN AERS   Inhalation   Inhale 2 puffs into the lungs every 4 (four) hours as needed. For cough/wheeze.         Marland Kitchen AMLODIPINE BESYLATE 10 MG PO TABS   Oral   Take 10 mg by mouth daily.         . GUAIFENESIN ER 600 MG PO TB12   Oral   Take 1,200 mg by mouth 2 (two) times daily.         . IBUPROFEN 200 MG PO TABS   Oral   Take 400 mg by mouth every 6 (six) hours as needed. For pain         . PHENYLEPH-DOXYLAMINE-DM-APAP 5-6.25-10-325 MG PO CAPS   Oral   Take 1 packet by mouth every 4 (four) hours as needed. For cold symptoms.           BP 159/85  Pulse 69  Temp 98.5 F (36.9 C) (Oral)  Resp 18  SpO2 98%  LMP 07/19/2012  Physical Exam  Nursing note and vitals reviewed. Constitutional: She is oriented to person, place, and time. She appears well-developed and well-nourished. No distress.  HENT:  Head: Normocephalic.       bilateral TMs show erythema and light reflex No bulging of TMs Bilateral maxillary sinus tenderness to  palpation  Posterior pharynx is mildly injected   Eyes: Conjunctivae normal and EOM are normal.  Cardiovascular: Normal rate, regular rhythm and normal heart sounds.  Exam reveals no gallop and no friction rub.   No murmur heard. Pulmonary/Chest: Effort normal and breath sounds normal. No stridor. No respiratory distress. She has no wheezes. She has no rhonchi. She has no rales.  Abdominal: Soft. Bowel sounds are normal. She exhibits no distension. There is no tenderness. There is no rebound.  Musculoskeletal: Normal range of motion.  Neurological: She is alert and oriented to person, place, and time.  Psychiatric: She has a normal mood and affect.    ED Course  Procedures (including critical care time) DIAGNOSTIC STUDIES: Oxygen Saturation is 98% on room air, normal by my interpretation.    COORDINATION OF CARE: 8:09 PM Discussed ED treatment with pt and pt agrees.     Labs Reviewed - No data to display No results found.   1. Acute  sinusitis       MDM  Maxillary sinusitis.  Filed Vitals:   08/23/12 1922  BP: 159/85  Pulse: 69  Temp: 98.5 F (36.9 C)  TempSrc: Oral  Resp: 18  SpO2: 98%    Pt verbalized understanding and agrees with care plan. Outpatient follow-up and return precautions given.    New Prescriptions   FLUTICASONE (FLONASE) 50 MCG/ACT NASAL SPRAY    Place 2 sprays into the nose daily.   LEVOFLOXACIN (LEVAQUIN) 750 MG TABLET    Take 1 tablet (750 mg total) by mouth daily. X 7 days    I personally performed the services described in this documentation, which was scribed in my presence. The recorded information has been reviewed and is accurate.   Wynetta Emery, PA-C 08/23/12 2011

## 2012-08-23 NOTE — ED Notes (Signed)
Pt states she started having sore throat 3 weeks ago and it went away now she hasnasal congestion and cough

## 2012-08-25 NOTE — ED Provider Notes (Signed)
Medical screening examination/treatment/procedure(s) were performed by non-physician practitioner and as supervising physician I was immediately available for consultation/collaboration.  Hurman Horn, MD 08/25/12 1352

## 2012-09-05 ENCOUNTER — Telehealth: Payer: Self-pay | Admitting: *Deleted

## 2012-09-05 NOTE — Telephone Encounter (Signed)
Patient called and stated that she had vaginal dryness and irritation, also complains of itching. I returned her call and she said that her labia and inner thighs are dry and itching. Stated that she doesn't not have any vaginal itching or irritation. I advised that she use mild soap such as unscented dove and fragrance free detergents. Pt is coming in to be seen on Monday. Pt agrees with plan and will try switching to milder products.

## 2012-09-08 ENCOUNTER — Encounter: Payer: Self-pay | Admitting: Advanced Practice Midwife

## 2012-09-08 ENCOUNTER — Ambulatory Visit (INDEPENDENT_AMBULATORY_CARE_PROVIDER_SITE_OTHER): Payer: BC Managed Care – PPO | Admitting: Advanced Practice Midwife

## 2012-09-08 ENCOUNTER — Ambulatory Visit: Payer: BC Managed Care – PPO | Admitting: Advanced Practice Midwife

## 2012-09-08 VITALS — BP 155/83 | HR 81 | Temp 99.1°F | Ht 64.0 in | Wt 325.4 lb

## 2012-09-08 DIAGNOSIS — B373 Candidiasis of vulva and vagina: Secondary | ICD-10-CM

## 2012-09-08 DIAGNOSIS — E669 Obesity, unspecified: Secondary | ICD-10-CM

## 2012-09-08 MED ORDER — FLUCONAZOLE 150 MG PO TABS: 150.0000 mg | ORAL_TABLET | Freq: Once | ORAL | Status: DC

## 2012-09-08 MED ORDER — NYSTATIN-TRIAMCINOLONE 100000-0.1 UNIT/GM-% EX OINT: TOPICAL_OINTMENT | Freq: Two times a day (BID) | CUTANEOUS | Status: DC

## 2012-09-08 NOTE — Patient Instructions (Signed)
Monilial Vaginitis Vaginitis in a soreness, swelling and redness (inflammation) of the vagina and vulva. Monilial vaginitis is not a sexually transmitted infection. CAUSES  Yeast vaginitis is caused by yeast (candida) that is normally found in your vagina. With a yeast infection, the candida has overgrown in number to a point that upsets the chemical balance. SYMPTOMS   White, thick vaginal discharge.  Swelling, itching, redness and irritation of the vagina and possibly the lips of the vagina (vulva).  Burning or painful urination.  Painful intercourse. DIAGNOSIS  Things that may contribute to monilial vaginitis are:  Postmenopausal and virginal states.  Pregnancy.  Infections.  Being tired, sick or stressed, especially if you had monilial vaginitis in the past.  Diabetes. Good control will help lower the chance.  Birth control pills.  Tight fitting garments.  Using bubble bath, feminine sprays, douches or deodorant tampons.  Taking certain medications that kill germs (antibiotics).  Sporadic recurrence can occur if you become ill. TREATMENT  Your caregiver will give you medication.  There are several kinds of anti monilial vaginal creams and suppositories specific for monilial vaginitis. For recurrent yeast infections, use a suppository or cream in the vagina 2 times a week, or as directed.  Anti-monilial or steroid cream for the itching or irritation of the vulva may also be used. Get your caregiver's permission.  Painting the vagina with methylene blue solution may help if the monilial cream does not work.  Eating yogurt may help prevent monilial vaginitis. HOME CARE INSTRUCTIONS   Finish all medication as prescribed.  Do not have sex until treatment is completed or after your caregiver tells you it is okay.  Take warm sitz baths.  Do not douche.  Do not use tampons, especially scented ones.  Wear cotton underwear.  Avoid tight pants and panty  hose.  Tell your sexual partner that you have a yeast infection. They should go to their caregiver if they have symptoms such as mild rash or itching.  Your sexual partner should be treated as well if your infection is difficult to eliminate.  Practice safer sex. Use condoms.  Some vaginal medications cause latex condoms to fail. Vaginal medications that harm condoms are:  Cleocin cream.  Butoconazole (Femstat).  Terconazole (Terazol) vaginal suppository.  Miconazole (Monistat) (may be purchased over the counter). SEEK MEDICAL CARE IF:   You have a temperature by mouth above 102 F (38.9 C).  The infection is getting worse after 2 days of treatment.  The infection is not getting better after 3 days of treatment.  You develop blisters in or around your vagina.  You develop vaginal bleeding, and it is not your menstrual period.  You have pain when you urinate.  You develop intestinal problems.  You have pain with sexual intercourse. Document Released: 04/25/2005 Document Revised: 10/08/2011 Document Reviewed: 01/07/2009 [employer] Patient Information 2013 [employer].  

## 2012-09-08 NOTE — Progress Notes (Signed)
Patient ID: Molly Archer, female   DOB: March 26, 1981, 32 y.o.   MRN: [id number] Molly Archer is a 32 y.o. G0P0000 here with c/o vaginal itching x 1 week, also thinks she might have felt a bump in her vaginal area. Has tried benadryl gel with no improvement. Recently completed course of abx for sinus infection. Pt also states she has not had a period since the end of December, regular prior to that time.     Allergies  Allergen Reactions  . Codeine Hives  . Penicillins Hives  . Shellfish Allergy Itching   Past Medical History  Diagnosis Date  . Asthma   . Hypertension   . Hx of Bell's palsy June 2013   Family History  Problem Relation Age of Onset  . Hypertension Other   . Diabetes Other   . Cancer Other    Current Outpatient Prescriptions on File Prior to Visit  Medication Sig Dispense Refill  . albuterol (PROVENTIL HFA;VENTOLIN HFA) 108 (90 BASE) MCG/ACT inhaler Inhale 2 puffs into the lungs every 4 (four) hours as needed. For cough/wheeze.      Marland Kitchen amLODipine (NORVASC) 10 MG tablet Take 10 mg by mouth daily.      . fluticasone (FLONASE) 50 MCG/ACT nasal spray Place 2 sprays into the nose daily.  16 g  0  . guaiFENesin (MUCINEX) 600 MG 12 hr tablet Take 1,200 mg by mouth as needed.       Marland Kitchen ibuprofen (ADVIL,MOTRIN) 200 MG tablet Take 400 mg by mouth every 6 (six) hours as needed. For pain       No current facility-administered medications on file prior to visit.   Review of Systems  Constitutional: Negative.   Respiratory: Negative.   Cardiovascular: Negative.   Gastrointestinal: Negative for nausea, vomiting, abdominal pain, diarrhea and constipation.  Genitourinary: Negative for dysuria, urgency, frequency, hematuria and flank pain.       Negative for vaginal bleeding, vaginal discharge  Musculoskeletal: Negative.   Neurological: Negative.   Psychiatric/Behavioral: Negative.     Objective  BP 155/83  Pulse 81  Temp(Src) 99.1 F (37.3 C)  Ht 5\' 4"  (1.626 m)   Wt 325 lb 6.4 oz (147.6 kg)  BMI 55.83 kg/m2  LMP 07/19/2012 Physical Exam  Nursing note and vitals reviewed. Constitutional: She is oriented to person, place, and time. She appears well-developed and well-nourished. No distress.  Morbidly obese   Cardiovascular: Normal rate.   Pulmonary/Chest: Effort normal.  Abdominal: Soft. There is no tenderness.  Genitourinary: There is no rash, tenderness or lesion on the right labia. There is no rash, tenderness or lesion on the left labia. Cervix exhibits no discharge and no friability. There is erythema around the vagina. No bleeding around the vagina. Vaginal discharge (adherent white cheesy discharge and thin white discharge) found.  Neurological: She is alert and oriented to person, place, and time.  Skin: Skin is warm and dry.  Psychiatric: She has a normal mood and affect.   A/P: 1. Vaginal candidiasis  fluconazole (DIFLUCAN) 150 MG tablet   fluconazole (DIFLUCAN) 150 MG tablet   Meds ordered this encounter  Medications  . fluconazole (DIFLUCAN) 150 MG tablet    Sig: Take 1 tablet (150 mg total) by mouth once. Repeat in 2 days.    Dispense:  2 tablet    Refill:  0    Order Specific Question:  Supervising Provider    Answer:  LEGGETT, KELLY H [[id number]]  . nystatin-triamcinolone ointment (MYCOLOG)  Sig: Apply topically 2 (two) times daily.    Dispense:  30 g    Refill:  0    Order Specific Question:  Supervising Provider    Answer:  Elsie Lincoln H [[id number]]   Return PRN if symptoms don't improve or if no menses by end of March

## 2012-09-08 NOTE — Progress Notes (Signed)
Here for c/o itching in labial area around to rectum for about a week, states she felt a bump. States did not have period in November.  States had surgery 10/12 to help make periods regular and states periods were regular until November.

## 2012-09-10 ENCOUNTER — Telehealth: Payer: Self-pay

## 2012-09-10 ENCOUNTER — Telehealth: Payer: Self-pay | Admitting: *Deleted

## 2012-09-10 NOTE — Telephone Encounter (Signed)
Message copied by Faythe Casa on Wed Sep 10, 2012  3:42 PM ------      Message from: Archie Patten      Created: Wed Sep 10, 2012  7:44 AM       Please call in flagyl 500 mg i po bid x7 days. Thanks! ------

## 2012-09-10 NOTE — Telephone Encounter (Signed)
Called pt and pt informed me that she was already aware but [facility] did not have Rx.  Called [facility] and called in Rx for flagyl.

## 2012-09-10 NOTE — Telephone Encounter (Signed)
Called pt and informed her of +BV and treatment prescribed.   Pt voiced understanding and stated that she has had BV in the past. She had no additional questions and will obtain medication today.

## 2012-09-10 NOTE — Telephone Encounter (Signed)
Message copied by Jill Side on Wed Sep 10, 2012 11:06 AM ------      Message from: Archie Patten      Created: Wed Sep 10, 2012  7:44 AM       Please call in flagyl 500 mg i po bid x7 days. Thanks! ------

## 2012-09-17 ENCOUNTER — Telehealth: Payer: Self-pay | Admitting: General Practice

## 2012-09-17 DIAGNOSIS — B9689 Other specified bacterial agents as the cause of diseases classified elsewhere: Secondary | ICD-10-CM

## 2012-09-17 MED ORDER — METRONIDAZOLE 500 MG PO TABS: 500.0000 mg | ORAL_TABLET | Freq: Two times a day (BID) | ORAL | Status: DC

## 2012-09-17 NOTE — Telephone Encounter (Signed)
Med ordered. Called patient, no answer, left message to call us back at the clinics

## 2012-09-17 NOTE — Telephone Encounter (Signed)
Message copied by Kathee Delton on Wed Sep 17, 2012  9:11 AM ------      Message from: Archie Patten      Created: Wed Sep 10, 2012  7:44 AM       Please call in flagyl 500 mg i po bid x7 days. Thanks! ------

## 2012-09-17 NOTE — Telephone Encounter (Signed)
Pt called back stating she was returning our phone call. Informed patient of BV and that a Rx to treat this was sent to her [facility] pharmacy. Patient stated she thinks someone already called her about this previously and that she is taking some antibiotic now. Upon chart review, diagnosis was already discussed with patient several days ago and Flagyl was ordered then. Apologized to patient for all the confusion and to disregard my message and to just continue taking the Flagyl she is on now. Patient verbalized understanding and had no further questions.

## 2012-09-18 ENCOUNTER — Telehealth: Payer: Self-pay | Admitting: *Deleted

## 2012-09-18 NOTE — Telephone Encounter (Signed)
Molly Archer called back and states she has already been notified and is on the flagyl and almost finished. Apologized for the duplicate call.

## 2012-09-18 NOTE — Telephone Encounter (Signed)
Message copied by Gerome Apley on Thu Sep 18, 2012 12:56 PM ------      Message from: Archie Patten      Created: Wed Sep 10, 2012  7:44 AM       Please call in flagyl 500 mg i po bid x7 days. Thanks! ------

## 2012-09-18 NOTE — Telephone Encounter (Signed)
Called Molly Archer and left a message we are calling with some information from your provider- please call clinic.

## 2012-10-30 ENCOUNTER — Other Ambulatory Visit: Payer: Self-pay | Admitting: Nurse Practitioner

## 2012-10-30 DIAGNOSIS — N6310 Unspecified lump in the right breast, unspecified quadrant: Secondary | ICD-10-CM

## 2012-11-12 ENCOUNTER — Ambulatory Visit
Admission: RE | Admit: 2012-11-12 | Discharge: 2012-11-12 | Disposition: A | Discharge status: Home / Self Care | Payer: BC Managed Care – PPO | Source: Ambulatory Visit | Attending: Nurse Practitioner | Admitting: Nurse Practitioner

## 2012-11-12 ENCOUNTER — Telehealth: Payer: Self-pay

## 2012-11-12 DIAGNOSIS — N6310 Unspecified lump in the right breast, unspecified quadrant: Secondary | ICD-10-CM

## 2012-11-12 NOTE — Telephone Encounter (Signed)
Message copied by Trudie Reed on Wed Nov 12, 2012  9:45 AM ------      Message from: Archie Patten      Created: Wed Sep 10, 2012  7:44 AM       Please call in flagyl 500 mg i po bid x7 days. Thanks! ------

## 2013-01-26 ENCOUNTER — Telehealth: Payer: Self-pay | Admitting: *Deleted

## 2013-01-26 DIAGNOSIS — B373 Candidiasis of vulva and vagina: Secondary | ICD-10-CM

## 2013-01-26 MED ORDER — FLUCONAZOLE 150 MG PO TABS: 150.0000 mg | ORAL_TABLET | Freq: Once | ORAL | Status: DC

## 2013-01-26 NOTE — Telephone Encounter (Signed)
Pt called and left message that she is having white vaginal discharge with lots of itching. She wasn't able to get an appt here until 7/25. Wanted to know if we could send something in for her. Per protocol will send in Diflucan.

## 2013-02-05 ENCOUNTER — Ambulatory Visit: Payer: BC Managed Care – PPO | Admitting: Obstetrics and Gynecology

## 2013-02-20 ENCOUNTER — Ambulatory Visit (INDEPENDENT_AMBULATORY_CARE_PROVIDER_SITE_OTHER): Payer: BC Managed Care – PPO | Admitting: Family Medicine

## 2013-02-20 ENCOUNTER — Encounter: Payer: Self-pay | Admitting: Family Medicine

## 2013-02-20 VITALS — BP 138/88 | HR 78 | Temp 98.8°F | Ht 64.0 in | Wt 316.1 lb

## 2013-02-20 DIAGNOSIS — N898 Other specified noninflammatory disorders of vagina: Secondary | ICD-10-CM

## 2013-02-20 DIAGNOSIS — R6889 Other general symptoms and signs: Secondary | ICD-10-CM

## 2013-02-20 DIAGNOSIS — E669 Obesity, unspecified: Secondary | ICD-10-CM

## 2013-02-20 DIAGNOSIS — B373 Candidiasis of vulva and vagina: Secondary | ICD-10-CM

## 2013-02-20 DIAGNOSIS — IMO0002 Reserved for concepts with insufficient information to code with codable children: Secondary | ICD-10-CM

## 2013-02-20 MED ORDER — FLUCONAZOLE 150 MG PO TABS: 150.0000 mg | ORAL_TABLET | Freq: Once | ORAL | Status: DC

## 2013-02-20 NOTE — Progress Notes (Signed)
Here for c/o vaginal bumps- started last week of June was irritated, now feel dry, bumps that come and go. One was white, raised, but has gone down mostly now. When washes with body wash it burns. Now using dove body wash and that causes burning. Also c/o clear discharge when wipes.

## 2013-02-20 NOTE — Patient Instructions (Signed)
Candidal Vulvovaginitis  Candidal vulvovaginitis is an infection of the vagina and vulva. The vulva is the skin around the opening of the vagina. This may cause itching and discomfort in and around the vagina.   HOME CARE  · Only take medicine as told by your doctor.  · Do not have sex (intercourse) until the infection is healed or as told by your doctor.  · Practice safe sex.  · Tell your sex partner about your infection.  · Do not douche or use tampons.  · Wear cotton underwear. Do not wear tight pants or panty hose.  · Eat yogurt. This may help treat and prevent yeast infections.  GET HELP RIGHT AWAY IF:   · You have a fever.  · Your problems get worse during treatment or do not get better in 3 days.  · You have discomfort, irritation, or itching in your vagina or vulva area.  · You have pain after sex.  · You start to get belly (abdominal) pain.  MAKE SURE YOU:  · Understand these instructions.  · Will watch your condition.  · Will get help right away if you are not doing well or get worse.  Document Released: 10/12/2008 Document Revised: 10/08/2011 Document Reviewed: 10/12/2008  [employer]® Patient Information ©2014 [employer].

## 2013-02-20 NOTE — Progress Notes (Signed)
Patient ID: Molly Archer, female   DOB: 15-Nov-1980, 32 y.o.   MRN: [id number]  S: Pt states she has been having a significant amount of vaginal itching and burning. - going on for several months - tried a diflucan and that helped some but not completely - uses dove body wash - cleans both inside and outside her vagina daily - has been having some clear discharge  - not sexually active for the last 2 years 03/2012- had pap with LSIL and high risk HPV. colpo done with bx x 2- HPV and low grade lesion- needs repeat pap and HPV 03/2013   Also had a recent white bump on her labia. Now has gone down. Still there a little. Started 1 month ago. No drainage, nontender.   O: Filed Vitals:   02/20/13 1044  BP: 138/88  Pulse: 78  Temp: 98.8 F (37.1 C)  Height: 5\' 4"  (1.626 m)  Weight: 316 lb 1.6 oz (143.382 kg)   BP 138/88  Pulse 78  Temp(Src) 98.8 F (37.1 C)  Ht 5\' 4"  (1.626 m)  Wt 316 lb 1.6 oz (143.382 kg)  BMI 54.23 kg/m2  LMP 01/25/2013 GENERAL: Well-developed, well-nourished female in no acute distress.  HEENT: Normocephalic, atraumatic. Sclerae anicteric.  ABDOMEN: Soft, nontender, nondistended. No organomegaly. PELVIC: Normal external female genitalia. Small skin tag on mons. No erythema.  Vagina is red and irritated and rugated.  Thick curdish discharge. Normal cervix contour. Uterus is normal in size. No adnexal mass or tenderness.  EXTREMITIES: No cyanosis, clubbing, or edema, 2+ distal pulses.  A/P:  Obesity  Vaginal discharge - Plan: Wet prep, genital  LSIL (low grade squamous intraepithelial lesion) on Pap smear  Vagina, candidiasis  1) vaginal discharge - exam appears candidal - likely due to daily internal and external cleansing - counseling provided as to cleaning and to stay only on the outside with a gentle bar soap - rx of diflucan for current infection  2) vaginal bump - skin tag on mons Reassurance given. No evidence of irritation  3) hx of LSIL and  high risk HPV with normal biopsy - scheduled for repeat gyn exam in September for cotesting  4) obesity - counseled on diet and weight loss  Gurjot Brisco L, MD

## 2013-02-21 LAB — WET PREP, GENITAL
Clue Cells Wet Prep HPF POC: NONE SEEN
WBC, Wet Prep HPF POC: NONE SEEN
Yeast Wet Prep HPF POC: NONE SEEN

## 2013-03-07 ENCOUNTER — Encounter (HOSPITAL_COMMUNITY): Payer: Self-pay | Admitting: Emergency Medicine

## 2013-03-07 ENCOUNTER — Emergency Department (HOSPITAL_COMMUNITY)
Admission: EM | Admit: 2013-03-07 | Discharge: 2013-03-07 | Disposition: A | Discharge status: Home / Self Care | Payer: BC Managed Care – PPO | Attending: Emergency Medicine | Admitting: Emergency Medicine

## 2013-03-07 DIAGNOSIS — R51 Headache: Secondary | ICD-10-CM | POA: Insufficient documentation

## 2013-03-07 DIAGNOSIS — Z872 Personal history of diseases of the skin and subcutaneous tissue: Secondary | ICD-10-CM | POA: Insufficient documentation

## 2013-03-07 DIAGNOSIS — J45909 Unspecified asthma, uncomplicated: Secondary | ICD-10-CM | POA: Insufficient documentation

## 2013-03-07 DIAGNOSIS — Z880 Allergy status to penicillin: Secondary | ICD-10-CM | POA: Insufficient documentation

## 2013-03-07 DIAGNOSIS — R519 Headache, unspecified: Secondary | ICD-10-CM

## 2013-03-07 DIAGNOSIS — H538 Other visual disturbances: Secondary | ICD-10-CM | POA: Insufficient documentation

## 2013-03-07 DIAGNOSIS — I10 Essential (primary) hypertension: Secondary | ICD-10-CM | POA: Insufficient documentation

## 2013-03-07 DIAGNOSIS — H53149 Visual discomfort, unspecified: Secondary | ICD-10-CM | POA: Insufficient documentation

## 2013-03-07 DIAGNOSIS — R110 Nausea: Secondary | ICD-10-CM | POA: Insufficient documentation

## 2013-03-07 DIAGNOSIS — Z8669 Personal history of other diseases of the nervous system and sense organs: Secondary | ICD-10-CM | POA: Insufficient documentation

## 2013-03-07 MED ORDER — PROCHLORPERAZINE EDISYLATE 5 MG/ML IJ SOLN
10.0000 mg | Freq: Once | INTRAMUSCULAR | Status: AC
  Administered 2013-03-07: 10 mg via INTRAVENOUS
  Filled 2013-03-07: qty 2

## 2013-03-07 MED ORDER — DIPHENHYDRAMINE HCL 50 MG/ML IJ SOLN
25.0000 mg | Freq: Once | INTRAMUSCULAR | Status: AC
  Administered 2013-03-07: 25 mg via INTRAVENOUS
  Filled 2013-03-07: qty 1

## 2013-03-07 MED ORDER — SODIUM CHLORIDE 0.9 % IV BOLUS (SEPSIS)
1000.0000 mL | INTRAVENOUS | Status: AC
  Administered 2013-03-07: 1000 mL via INTRAVENOUS

## 2013-03-07 MED ORDER — ACETAMINOPHEN 500 MG PO TABS: 500.0000 mg | ORAL_TABLET | Freq: Four times a day (QID) | ORAL | Status: DC | PRN

## 2013-03-07 MED ORDER — ACETAMINOPHEN 500 MG PO TABS
1000.0000 mg | ORAL_TABLET | Freq: Once | ORAL | Status: AC
  Administered 2013-03-07: 1000 mg via ORAL
  Filled 2013-03-07: qty 2

## 2013-03-07 NOTE — ED Notes (Signed)
Pt c/o HA, blurred vision x 2 days. +photophobia, +phonophobia. Pt tearful in triage.

## 2013-03-07 NOTE — ED Provider Notes (Signed)
CSN: [id number]     Arrival date & time 03/07/13  0102 History     First MD Initiated Contact with Patient 03/07/13 0122     Chief Complaint  Patient presents with  . Headache   (Consider location/radiation/quality/duration/timing/severity/associated sxs/prior Treatment) HPI Pt is a 32yo female c/o headache that started yesterday.  Pain is constant, throbbing, pounding ache in front and back of her head, 10/10.  Feels similar to previous headaches.  States she took tylenol yesterday that did help but headache returned and will not go away.  No associated with photophobia and phonophobia with mild nausea and blurred vision.  Pt tried hydrocodone today which she states she has for headaches and painful menstrual cramps but it did no relieve pain today.  Pt states she has been seen in ED before for tx of severe headaches, last time being in the Spring.  Pt has hx of HTN but otherwise healthy.  Does not have neurologist for headaches but does have close f/u with PCP, Dr. Audria Nine.  Denies fever, vomiting, dizziness, or head trauma.   Past Medical History  Diagnosis Date  . Asthma   . Hypertension   . Hx of Bell's palsy June 2013  . Psoriasis    Past Surgical History  Procedure Laterality Date  . Combined hysteroscopy diagnostic / d&c    . Dilation and curettage of uterus    . Dilation and curettage of uterus     Family History  Problem Relation Age of Onset  . Hypertension Other   . Diabetes Other   . Cancer Other    History  Substance Use Topics  . Smoking status: Never Smoker   . Smokeless tobacco: Never Used  . Alcohol Use: No   OB History   Grav Para Term Preterm Abortions TAB SAB Ect Mult Living   0 0 0 0 0 0 0 0 0 0      Review of Systems  HENT: Negative for neck pain and neck stiffness.   Eyes: Positive for photophobia and visual disturbance ( blurred). Negative for pain, discharge, redness and itching.  Gastrointestinal: Positive for nausea. Negative for vomiting.   Neurological: Positive for headaches. Negative for dizziness, weakness, light-headedness and numbness.  All other systems reviewed and are negative.    Allergies  Codeine; Peanut-containing drug products; Penicillins; and Shellfish allergy  Home Medications   Current Outpatient Rx  Name  Route  Sig  Dispense  Refill  . amLODipine (NORVASC) 10 MG tablet   Oral   Take 10 mg by mouth every evening.          Marland Kitchen HYDROcodone-acetaminophen (NORCO/VICODIN) 5-325 MG per tablet   Oral   Take 1 tablet by mouth every 6 (six) hours as needed for pain.         Marland Kitchen ibuprofen (ADVIL,MOTRIN) 200 MG tablet   Oral   Take 400 mg by mouth every 6 (six) hours as needed for pain. For pain         . acetaminophen (TYLENOL) 500 MG tablet   Oral   Take 1 tablet (500 mg total) by mouth every 6 (six) hours as needed for pain.   30 tablet   0   . albuterol (PROVENTIL HFA;VENTOLIN HFA) 108 (90 BASE) MCG/ACT inhaler   Inhalation   Inhale 2 puffs into the lungs every 4 (four) hours as needed for wheezing or shortness of breath. For cough/wheeze.          BP 148/80  Pulse 69  Temp(Src) 98.3 F (36.8 C) (Oral)  Resp 18  Ht 5\' 5"  (1.651 m)  Wt 316 lb (143.337 kg)  BMI 52.59 kg/m2  SpO2 99%  LMP 01/20/2013 Physical Exam  Nursing note and vitals reviewed. Constitutional: She is oriented to person, place, and time. She appears well-developed and well-nourished. No distress.  Pt lying in darkened room with sweater over her eyes   HENT:  Head: Normocephalic and atraumatic.  Eyes: Conjunctivae are normal. No scleral icterus.  Neck: Normal range of motion. Neck supple.  No nuchal rigidity or meningeal signs.    Cardiovascular: Normal rate, regular rhythm and normal heart sounds.   Pulmonary/Chest: Effort normal and breath sounds normal. No respiratory distress. She has no wheezes. She has no rales. She exhibits no tenderness.  Abdominal: Soft. Bowel sounds are normal. She exhibits no distension  and no mass. There is no tenderness. There is no rebound and no guarding.  Musculoskeletal: Normal range of motion.  Neurological: She is alert and oriented to person, place, and time. She has normal strength. No cranial nerve deficit or sensory deficit. She displays a negative Romberg sign. Coordination normal. GCS eye subscore is 4. GCS verbal subscore is 5. GCS motor subscore is 6.  CN II-XII in tact, no focal deficit, nl finger to nose coordination. Nl sensation, 5/5 strength in all major muscle groups. Neg romberg and nl gait.    Skin: Skin is warm and dry. She is not diaphoretic.    ED Course   Procedures (including critical care time)  Labs Reviewed - No data to display No results found. 1. Headache     MDM  Pt c/o severe headache, similar to previous headaches.  Has come to ED in past for tx when pain persists like today.  Nl neuro exam.  Not concerned for [facility].  Do not believe imaging is needed at this time.  Tx: fluids, compazine, benadryl, acetaminophen and reevaluate.   3:59 AM Pt states she is feeling much better and is ready to go home. Will discharge pt home and have her f/u with Dr. Audria Nine, PCP. Return precautions given. Pt verbalized understanding and agreement with tx plan. Vitals: unremarkable. Discharged in stable condition.    Discussed pt with attending during ED encounter.    Junius Finner, PA-C 03/07/13 0400

## 2013-03-07 NOTE — ED Provider Notes (Signed)
Medical screening examination/treatment/procedure(s) were performed by non-physician practitioner and as supervising physician I was immediately available for consultation/collaboration.   Hanley Seamen, MD 03/07/13 [phone]

## 2013-09-09 ENCOUNTER — Telehealth: Payer: Self-pay | Admitting: *Deleted

## 2013-09-09 DIAGNOSIS — B3731 Acute candidiasis of vulva and vagina: Secondary | ICD-10-CM

## 2013-09-09 DIAGNOSIS — B373 Candidiasis of vulva and vagina: Secondary | ICD-10-CM

## 2013-09-09 DIAGNOSIS — N898 Other specified noninflammatory disorders of vagina: Secondary | ICD-10-CM

## 2013-09-09 MED ORDER — FLUCONAZOLE 150 MG PO TABS: 150.0000 mg | ORAL_TABLET | Freq: Once | ORAL | Status: DC

## 2013-09-09 NOTE — Telephone Encounter (Signed)
Called Molly Archer and left message we received a call, but no message. Please call clinic if you do need our assistance and leave a detailed message.

## 2013-09-09 NOTE — Telephone Encounter (Signed)
Molly Archer called back, reports she thinks she is having vaginal itching like before when she has yeast infection. States has made appointment, but it is not until March. Informed her I could send in prescription to her pharmacy for diflucan x 1 . Advised her to keep already scheduled appt if that does not relieve her symptoms as could be she has another vaginal infection.Molly Archer voices understanding

## 2013-09-09 NOTE — Telephone Encounter (Signed)
Reather called , message cut off before she left a message

## 2013-10-21 ENCOUNTER — Encounter: Payer: Self-pay | Admitting: Family Medicine

## 2013-10-21 ENCOUNTER — Ambulatory Visit (INDEPENDENT_AMBULATORY_CARE_PROVIDER_SITE_OTHER): Payer: BC Managed Care – PPO | Admitting: Family Medicine

## 2013-10-21 VITALS — BP 179/108 | HR 75 | Temp 97.8°F | Ht 65.0 in | Wt 321.2 lb

## 2013-10-21 DIAGNOSIS — Z01419 Encounter for gynecological examination (general) (routine) without abnormal findings: Secondary | ICD-10-CM

## 2013-10-21 DIAGNOSIS — N912 Amenorrhea, unspecified: Secondary | ICD-10-CM

## 2013-10-21 DIAGNOSIS — I10 Essential (primary) hypertension: Secondary | ICD-10-CM

## 2013-10-21 LAB — POCT PREGNANCY, URINE: PREG TEST UR: NEGATIVE

## 2013-10-21 NOTE — Patient Instructions (Signed)
Secondary Amenorrhea  Secondary amenorrhea is the stopping of menstrual flow for  6 months in a female who has previously had periods. There are many possible causes. Most of these causes are not serious. Usually, treating the underlying problem causing the loss of menses will return your periods to normal.  CAUSES  Some common and uncommon causes of not menstruating include:  Malnutrition.  Low blood sugar (hypoglycemia).  Polycystic ovary disease.  Stress or fear.  Breastfeeding.  Hormone imbalance.  Ovarian failure.  Medicines.  Extreme obesity.  Cystic fibrosis.  Low body weight or drastic weight reduction from any cause.  Early menopause.  Removal of ovaries or uterus.  Contraceptives.  Illness.  Long-term (chronic) illnesses.  Cushing syndrome.  Thyroid problems.  Birth control pills, patches, or vaginal rings for birth control.   RISK FACTORS You may be at greater risk of secondary amenorrhea if:  You have a family history of this condition.  You have an eating disorder.  You do athletic training. DIAGNOSIS  A diagnosis is made by your health care provider taking a medical history and doing a physical exam. This will include a pelvic exam to check for problems with your reproductive organs. Pregnancy must be ruled out. Often, numerous blood tests are done to measure different hormones in the body. Urine testing may be done. Specialized exams (ultrasound, CT scan, MRI, or hysteroscopy) may have to be done as well as measuring the body mass index (BMI). TREATMENT  Treatment depends on the cause of the amenorrhea. If an eating disorder is present, this can be treated with an adequate diet and therapy. Chronic illnesses may improve with treatment of the illness. Amenorrhea may be corrected with medicines, lifestyle changes, or surgery. If the amenorrhea cannot be corrected, it is sometimes possible to create a false menstruation with medicines. HOME CARE  INSTRUCTIONS  Maintain a healthy diet.  Manage weight problems.  Exercise regularly but not excessively.  Get adequate sleep.  Manage stress.  Be aware of changes in your menstrual cycle. Keep a record of when your periods occur. Note the date your period starts, how long it lasts, and any problems. SEEK MEDICAL CARE IF: Your symptoms do not get better with treatment. Document Released: 08/27/2006 Document Revised: 03/18/2013 Document Reviewed: 01/01/2013 [facility] Patient Information 2014 [place], [place].

## 2013-10-21 NOTE — Progress Notes (Signed)
Gynecology OFFICE NOTE  Chief Complaint:  Routine gyn exam and amenorrhea  Primary Care Physician: Ellsworth Lennox, MD  HPI:  Molly Archer is a 33 yo G0 here for gyn visit  1) PAP smear - has had a whole life of abnormal pap smears - 2013 had LSIL and high risk HPV.  Colposcopy looked like koilocytosis was told to repeat in 1 year. Is 6 months late.  - hasn't been back yet  2) irregular cycles - has always had irregular cycles - was on depo for 7 years and then came off it several years ago - had regular cycles up until September 30th - none since then - one day of spotting on Christmas - still gets premenstrual symptoms each month but doesn't bleed.   Has a hx of D&Cs due to persistent bleeding back in the past.  - has gained significant weight - no abnormal hair patterns  No fevers, chills  Tachycardia, chest pain, sob, galactorrhea, palpitations.  No HA, vision changes, weakness.    PMHx:  Past Medical History  Diagnosis Date  . Asthma   . Hypertension   . Hx of Bell's palsy June 2013  . Psoriasis     Past Surgical History  Procedure Laterality Date  . Combined hysteroscopy diagnostic / d&c    . Dilation and curettage of uterus    . Dilation and curettage of uterus      FAMHx:  Family History  Problem Relation Age of Onset  . Hypertension Other   . Diabetes Other   . Cancer Other     SOCHx:   reports that she has never smoked. She has never used smokeless tobacco. She reports that she does not drink alcohol or use illicit drugs.  ALLERGIES:  Allergies  Allergen Reactions  . Codeine Hives  . Peanut-Containing Drug Products Itching  . Penicillins Hives  . Shellfish Allergy Itching    ROS: Pertinent ROS as seen in HPI. Otherwise negative.   HOME MEDS: Current Outpatient Prescriptions  Medication Sig Dispense Refill  . albuterol (PROVENTIL HFA;VENTOLIN HFA) 108 (90 BASE) MCG/ACT inhaler Inhale 2 puffs into the lungs every 4 (four) hours  as needed for wheezing or shortness of breath. For cough/wheeze.      Marland Kitchen ibuprofen (ADVIL,MOTRIN) 200 MG tablet Take 400 mg by mouth every 6 (six) hours as needed for pain. For pain      . amLODipine (NORVASC) 10 MG tablet Take 10 mg by mouth every evening.        No current facility-administered medications for this visit.    LABS/IMAGING: Results for orders placed in visit on 10/21/13 (from the past 48 hour(s))  POCT PREGNANCY, URINE     Status: None   Collection Time    10/21/13  4:06 PM      Result Value Ref Range   Preg Test, Ur NEGATIVE  NEGATIVE   Comment:            THE SENSITIVITY OF THIS     METHODOLOGY IS >24 mIU/mL   No results found.  VITALS: BP 179/108  Pulse 75  Temp(Src) 97.8 F (36.6 C) (Oral)  Ht 5\' 5"  (1.651 m)  Wt 321 lb 3.2 oz (145.695 kg)  BMI 53.45 kg/m2  EXAM: GENERAL: obese female in no acute distress.  HEENT: Normocephalic, atraumatic. Sclerae anicteric.  NECK: Supple. Normal thyroid.  LUNGS: Clear to auscultation bilaterally.  HEART: Regular rate and rhythm. BREASTS: Symmetric in size. No masses, skin changes, nipple drainage,  or lymphadenopathy. ABDOMEN: Soft, nontender, nondistended. No organomegaly. PELVIC: Normal external female genitalia. Vagina is pink and rugated.  Normal discharge. Normal cervix contour. Pap smear obtained. Uterus  possibly slightly enlarged on internal exam (8-10 week size) but difficult to ascertain due to obesity. Also could just be retroverted. . No adnexal mass or tenderness.  EXTREMITIES: No cyanosis, clubbing, or edema, 2+ distal pulses.     ASSESSMENT: Amenorrhea - Plan: Cytology - PAP, Follicle stimulating hormone, TSH, Prolactin, Estradiol, Hemoglobin A1c, DHEA-sulfate  Routine gynecological examination - Plan: Cytology - PAP, Follicle stimulating hormone, TSH, Prolactin, Estradiol  Hypertension  PLAN: 1) hypertension - urged to take medicines as pt admits to never taking them - discussed risk of stroke    2) routine gyn exam - PAP and pelvic done today  - urged exercise and diet   3) amenorrhea - will check labs today (FSH, TSH, prolactin, estradiol, DHEA, A1C) - UPT neg  - plan to f/u results and then likely progesterone challenge her.

## 2013-10-21 NOTE — Progress Notes (Signed)
Patient states she doesn't really take her blood pressure medication because "she really isn't really a medication taker." discussed with patient importance of taking blood pressure medication every day.

## 2013-10-22 LAB — PROLACTIN: Prolactin: 3.9 ng/mL

## 2013-10-22 LAB — TSH: TSH: 1.403 u[IU]/mL (ref 0.350–4.500)

## 2013-10-22 LAB — ESTRADIOL: Estradiol: 30.8 pg/mL

## 2013-10-22 LAB — FOLLICLE STIMULATING HORMONE: FSH: 3.7 m[IU]/mL

## 2013-10-22 LAB — DHEA-SULFATE: DHEA SO4: 40 ug/dL (ref 35–430)

## 2013-11-02 ENCOUNTER — Telehealth: Payer: Self-pay | Admitting: *Deleted

## 2013-11-02 DIAGNOSIS — N912 Amenorrhea, unspecified: Secondary | ICD-10-CM

## 2013-11-02 NOTE — Telephone Encounter (Addendum)
Message copied by Langston Reusing on Mon Nov 02, 2013  2:26 PM ------      Message from: Kassie Mends      Created: Mon Nov 02, 2013  9:34 AM       Hi ladies, will you call the pt and let her know that all her results were normal as we suspected. I would like for her to get a pelvic US at her convenience for amenorrhea. Would you be willing to schedule that for her? Thanks!            Keli   ------  Called pt and left message stating that I am calling with results and recommendation from the doctor.  Please call back and leave a message stating whether we may leave a detailed message on your voice mail. Ojani Berenson RNC          Pt returned my call at 3pm and I informed her of test results as well as Dr. Hansel Starling recommendation for pelvic US.  Pt agreed to [place] and appt was made for 4/14 @ 1545. Pt voiced understanding.  Lysandra Loughmiller RNC

## 2013-11-10 ENCOUNTER — Ambulatory Visit (HOSPITAL_COMMUNITY)
Admission: RE | Admit: 2013-11-10 | Discharge: 2013-11-10 | Disposition: A | Discharge status: Home / Self Care | Payer: BC Managed Care – PPO | Source: Ambulatory Visit | Attending: Family Medicine | Admitting: Family Medicine

## 2013-11-10 DIAGNOSIS — N912 Amenorrhea, unspecified: Secondary | ICD-10-CM

## 2013-11-10 DIAGNOSIS — N83209 Unspecified ovarian cyst, unspecified side: Secondary | ICD-10-CM | POA: Insufficient documentation

## 2013-11-10 DIAGNOSIS — N854 Malposition of uterus: Secondary | ICD-10-CM | POA: Insufficient documentation

## 2013-11-12 ENCOUNTER — Telehealth: Payer: Self-pay

## 2013-11-12 MED ORDER — MEDROXYPROGESTERONE ACETATE 10 MG PO TABS: 10.0000 mg | ORAL_TABLET | Freq: Every day | ORAL | Status: DC

## 2013-11-12 NOTE — Telephone Encounter (Signed)
Message copied by Geanie Logan on Thu Nov 12, 2013  8:33 AM ------      Message from: Kassie Mends      Created: Wed Nov 11, 2013 10:49 AM       Please call the pt and let her know that her Korea overall looked ok. It does look like her endometrial lining is thick so we are going to try and get her to have a period.  Please rx provera 10mg  daily x10 days. Have her take it starting now so she has finished prior to seeing Dr. Roselie Awkward on the 29th.  Thanks! ------

## 2013-11-12 NOTE — Telephone Encounter (Signed)
Provera prescribed. Called pt. And informed her of results and recommendation. Pt. Verbalized she would start it today and we will see her on the 29th. No questions or concerns.

## 2013-11-25 ENCOUNTER — Ambulatory Visit (INDEPENDENT_AMBULATORY_CARE_PROVIDER_SITE_OTHER): Payer: BC Managed Care – PPO | Admitting: Obstetrics & Gynecology

## 2013-11-25 VITALS — BP 119/67 | HR 70 | Temp 99.0°F | Ht 65.0 in | Wt 320.5 lb

## 2013-11-25 DIAGNOSIS — N912 Amenorrhea, unspecified: Secondary | ICD-10-CM

## 2013-11-25 MED ORDER — NORGESTIMATE-ETH ESTRADIOL 0.25-35 MG-MCG PO TABS: 1.0000 | ORAL_TABLET | Freq: Every day | ORAL | Status: DC

## 2013-11-25 NOTE — Progress Notes (Signed)
Patient ID: [name], female   DOB: 04-16-1981, 33 y.o.   MRN: [id number]  Chief Complaint  Patient presents with  . Follow-up    HPI MARIADEL MRUK is a 33 y.o. female.  [id number] Patient's last menstrual period was 04/28/2013. US showed 23 mm endometrium no w/d bleeding yet but hasn't finished provera yet HPI  Past Medical History  Diagnosis Date  . Asthma   . Hypertension   . Hx of Bell's palsy June 2013  . Psoriasis     Past Surgical History  Procedure Laterality Date  . Combined hysteroscopy diagnostic / d&c    . Dilation and curettage of uterus    . Dilation and curettage of uterus      Family History  Problem Relation Age of Onset  . Hypertension Other   . Diabetes Other   . Cancer Other     Social History History  Substance Use Topics  . Smoking status: Never Smoker   . Smokeless tobacco: Never Used  . Alcohol Use: No    Allergies  Allergen Reactions  . Codeine Hives  . Peanut-Containing Drug Products Itching  . Penicillins Hives  . Shellfish Allergy Itching    Current Outpatient Prescriptions  Medication Sig Dispense Refill  . albuterol (PROVENTIL HFA;VENTOLIN HFA) 108 (90 BASE) MCG/ACT inhaler Inhale 2 puffs into the lungs every 4 (four) hours as needed for wheezing or shortness of breath. For cough/wheeze.      Marland Kitchen amLODipine (NORVASC) 10 MG tablet Take 10 mg by mouth every evening.       Marland Kitchen ibuprofen (ADVIL,MOTRIN) 200 MG tablet Take 400 mg by mouth every 6 (six) hours as needed for pain. For pain      . medroxyPROGESTERone (PROVERA) 10 MG tablet Take 1 tablet (10 mg total) by mouth daily.  10 tablet  0  . norgestimate-ethinyl estradiol (ORTHO-CYCLEN,SPRINTEC,PREVIFEM) 0.25-35 MG-MCG tablet Take 1 tablet by mouth daily.  1 Package  11   No current facility-administered medications for this visit.    Review of Systems Review of Systems  Constitutional: Negative.   Genitourinary: Negative for vaginal bleeding, pelvic pain and  dyspareunia.    Blood pressure 119/67, pulse 70, temperature 99 F (37.2 C), height 5\' 5"  (1.651 m), weight 320 lb 8 oz (145.378 kg), last menstrual period 04/28/2013.  Physical Exam Physical Exam  Constitutional: No distress.  obese  Psychiatric: She has a normal mood and affect. Her behavior is normal.    Data Reviewed  CLINICAL DATA: Absence of menstruation, amenorrhea since  04/28/2013, past history of the indices due to persistent bleeding,  endometrial polyps  EXAM:  TRANSABDOMINAL AND TRANSVAGINAL ULTRASOUND OF PELVIS  TECHNIQUE:  Both transabdominal and transvaginal ultrasound examinations of the  pelvis were performed. Transabdominal technique was performed for  global imaging of the pelvis including uterus, ovaries, adnexal  regions, and pelvic cul-de-sac. It was necessary to proceed with  endovaginal exam following the transabdominal exam to visualize the  endometrium.  COMPARISON: 02/03/2010  FINDINGS:  Uterus  Measurements: 7.3 x 5.6 x 6.5 cm. Retroverted. Complex cystic nodule  within myometrium 6 x 6 x 6 mm, not seen on previous exam.  Endometrium  Thickness: 24 mm thick, abnormal. Heterogeneous appearance with tiny  cystic areas.  Right ovary  Measurements: 3.6 x 2.6 x 2.7 cm. Dominant cyst 2.3 x 2.0 x 2.1 cm.  Left ovary  Measurements: 4.4 x 3.2 x 2.7 cm. Dominant cyst 3.3 x 2.7 x 2.5 cm  Other findings  No free pelvic fluid or additional adnexal masses  IMPRESSION:  Abnormal thickened heterogeneous endometrial complex measuring 24 mm  thick.  Differential diagnosis includes hyperplasia, polyp, and tumor.  Consider follow up in 6-8 weeks or tissue diagnosis.  Small bilateral ovarian cysts.  Retroverted uterus.  Electronically Signed  By: Lavonia Dana M.D.  On: 11/10/2013 16:51         Assessment Likely anovulation    Plan    OCP-Sprintec Rx, RTC 3 mo consider [place] for endometrium assessment        Woodroe Mode 11/25/2013, 2:34  PM

## 2013-11-25 NOTE — Patient Instructions (Signed)
Oral Contraception Information  Oral contraceptive pills (OCPs) are medicines taken to prevent pregnancy. OCPs work by preventing the ovaries from releasing eggs. The hormones in OCPs also cause the cervical mucus to thicken, preventing the sperm from entering the uterus. The hormones also cause the uterine lining to become thin, not allowing a fertilized egg to attach to the inside of the uterus. OCPs are highly effective when taken exactly as prescribed. However, OCPs do not prevent sexually transmitted diseases (STDs). Safe sex practices, such as using condoms along with the pill, can help prevent STDs.   Before taking the pill, you may have a physical exam and Pap test. Your health care provider may order blood tests. The health care provider will make sure you are a good candidate for oral contraception. Discuss with your health care provider the possible side effects of the OCP you may be prescribed. When starting an OCP, it can take 2 to 3 months for the body to adjust to the changes in hormone levels in your body.   TYPES OF ORAL CONTRACEPTION  · The combination pill This pill contains estrogen and progestin (synthetic progesterone) hormones. The combination pill comes in 21-day, 28-day, or 91-day packs. Some types of combination pills are meant to be taken continuously (365-day pills). With 21-day packs, you do not take pills for 7 days after the last pill. With 28-day packs, the pill is taken every day. The last 7 pills are without hormones. Certain types of pills have more than 21 hormone-containing pills. With 91-day packs, the first 84 pills contain both hormones, and the last 7 pills contain no hormones or contain estrogen only.  · The minipill This pill contains the progesterone hormone only. The pill is taken every day continuously. It is very important to take the pill at the same time each day. The minipill comes in packs of 28 pills. All 28 pills contain the hormone.    ADVANTAGES OF ORAL  CONTRACEPTIVE PILLS  · Decreases premenstrual symptoms.    · Treats menstrual period cramps.    · Regulates the menstrual cycle.    · Decreases a heavy menstrual flow.    · May treat acne, depending on the type of pill.    · Treats abnormal uterine bleeding.    · Treats polycystic ovarian syndrome.    · Treats endometriosis.    · Can be used as emergency contraception.    THINGS THAT CAN MAKE ORAL CONTRACEPTIVE PILLS LESS EFFECTIVE  OCPs can be less effective if:   · You forget to take the pill at the same time every day.    · You have a stomach or intestinal disease that lessens the absorption of the pill.    · You take OCPs with other medicines that make OCPs less effective, such as antibiotics, certain HIV medicines, and some seizure medicines.    · You take expired OCPs.    · You forget to restart the pill on day 7, when using the packs of 21 pills.    RISKS ASSOCIATED WITH ORAL CONTRACEPTIVE PILLS   Oral contraceptive pills can sometimes cause side effects, such as:  · Headache.  · Nausea.  · Breast tenderness.  · Irregular bleeding or spotting.  Combination pills are also associated with a small increased risk of:  · Blood clots.  · Heart attack.  · Stroke.  Document Released: 10/06/2002 Document Revised: 05/06/2013 Document Reviewed: 01/04/2013  ExitCare® Patient Information ©2014 [employer].

## 2013-11-27 ENCOUNTER — Encounter: Payer: Self-pay | Admitting: Obstetrics & Gynecology

## 2013-12-01 ENCOUNTER — Telehealth: Payer: Self-pay | Admitting: General Practice

## 2013-12-01 NOTE — Telephone Encounter (Signed)
Patient called and left message stating she was put on birth control pills last week to start her cycle and regulate it because she hasn't had a period. Her period finally started and now she is having severe pain. Her pain is so severe that she had to leave in the middle of her final exam and is in so much pain right now and she would like something prescribed for pain. Called patient back stating I was returning your phone call and asked patient if she had taken anything for the pain and she stated no. Recommended patient try ibuprofen 600mg  every 8 hours for the pain and if that doesn't help and she is still having a severe amount of pain she should go to the ER. Patient verbalized understanding to all and had no further questions

## 2013-12-04 ENCOUNTER — Encounter: Payer: Self-pay | Admitting: Family Medicine

## 2013-12-05 ENCOUNTER — Encounter (HOSPITAL_COMMUNITY): Payer: Self-pay | Admitting: *Deleted

## 2013-12-05 ENCOUNTER — Inpatient Hospital Stay (HOSPITAL_COMMUNITY)
Admission: AD | Admit: 2013-12-05 | Discharge: 2013-12-05 | Disposition: A | Discharge status: Home / Self Care | Payer: BC Managed Care – PPO | Source: Ambulatory Visit | Attending: Family Medicine | Admitting: Family Medicine

## 2013-12-05 DIAGNOSIS — N949 Unspecified condition associated with female genital organs and menstrual cycle: Secondary | ICD-10-CM | POA: Insufficient documentation

## 2013-12-05 DIAGNOSIS — N939 Abnormal uterine and vaginal bleeding, unspecified: Secondary | ICD-10-CM

## 2013-12-05 DIAGNOSIS — Z880 Allergy status to penicillin: Secondary | ICD-10-CM | POA: Insufficient documentation

## 2013-12-05 DIAGNOSIS — I10 Essential (primary) hypertension: Secondary | ICD-10-CM | POA: Insufficient documentation

## 2013-12-05 DIAGNOSIS — Z8249 Family history of ischemic heart disease and other diseases of the circulatory system: Secondary | ICD-10-CM | POA: Insufficient documentation

## 2013-12-05 DIAGNOSIS — N938 Other specified abnormal uterine and vaginal bleeding: Secondary | ICD-10-CM | POA: Insufficient documentation

## 2013-12-05 LAB — CBC
HEMATOCRIT: 32.3 % — AB (ref 36.0–46.0)
HEMOGLOBIN: 10.3 g/dL — AB (ref 12.0–15.0)
MCH: 26.1 pg (ref 26.0–34.0)
MCHC: 31.9 g/dL (ref 30.0–36.0)
MCV: 81.8 fL (ref 78.0–100.0)
Platelets: 261 10*3/uL (ref 150–400)
RBC: 3.95 MIL/uL (ref 3.87–5.11)
RDW: 13.8 % (ref 11.5–15.5)
WBC: 6.1 10*3/uL (ref 4.0–10.5)

## 2013-12-05 LAB — URINALYSIS, ROUTINE W REFLEX MICROSCOPIC
BILIRUBIN URINE: NEGATIVE
GLUCOSE, UA: 100 mg/dL — AB
KETONES UR: 15 mg/dL — AB
Leukocytes, UA: NEGATIVE
NITRITE: POSITIVE — AB
PH: 5 (ref 5.0–8.0)
Protein, ur: 100 mg/dL — AB
Specific Gravity, Urine: 1.025 (ref 1.005–1.030)
Urobilinogen, UA: 1 mg/dL (ref 0.0–1.0)

## 2013-12-05 LAB — URINE MICROSCOPIC-ADD ON

## 2013-12-05 MED ORDER — KETOROLAC TROMETHAMINE 10 MG PO TABS: 10.0000 mg | ORAL_TABLET | Freq: Four times a day (QID) | ORAL | Status: DC | PRN

## 2013-12-05 MED ORDER — KETOROLAC TROMETHAMINE 60 MG/2ML IM SOLN
60.0000 mg | Freq: Once | INTRAMUSCULAR | Status: AC
  Administered 2013-12-05: 60 mg via INTRAMUSCULAR
  Filled 2013-12-05: qty 2

## 2013-12-05 NOTE — Discharge Instructions (Signed)
Keep taking your medications as ordered. Stop Ibuprofen while taking Toradol. Call the clinic if your bleeding worsens.

## 2013-12-05 NOTE — MAU Provider Note (Signed)
Attestation of Attending Supervision of Advanced Practitioner (PA/CNM/NP): Evaluation and management procedures were performed by the Advanced Practitioner under my supervision and collaboration.  I have reviewed the Advanced Practitioner's note and chart, and I agree with the management and plan.  Sherin Murdoch S Maleeha Halls, MD [facility] Attending 12/05/2013 9:12 PM   

## 2013-12-05 NOTE — MAU Provider Note (Signed)
History     CSN: [id number]  Arrival date and time: 12/05/13 1000   First Provider Initiated Contact with Patient 12/05/13 1057      Chief Complaint  Patient presents with  . Vaginal Bleeding  . Abdominal Pain   HPI Molly Archer 33 y.o. Client is patient in GYN clinic was seen 11-27-13 in Clinic.  Has previously had a D&C for heavy bleeding a few years ago, then had regular periods until Sept. 2015 when they became irregular again.  Was given a trial of Provera and did not bleed.  Had an [place] and has a thickened uterine lining of 24 mm.  Was put on birth control pills - Sprintec.  Started the pills on 11-28-13 and began having heavy vaginal bleeding on 12-03-13. Is having cramping which is not relieved by Ibuprofen 600 mg.  Is worried about the blood clots she is passing.  Reviewed the Ultrasound from 11-10-13 and the clinic note from 11-25-13.   OB History   Grav Para Term Preterm Abortions TAB SAB Ect Mult Living   0 0 0 0 0 0 0 0 0 0       Past Medical History  Diagnosis Date  . Asthma   . Hypertension   . Hx of Bell's palsy June 2013  . Psoriasis     Past Surgical History  Procedure Laterality Date  . Combined hysteroscopy diagnostic / d&c    . Dilation and curettage of uterus    . Dilation and curettage of uterus      Family History  Problem Relation Age of Onset  . Hypertension Other   . Diabetes Other   . Cancer Other     History  Substance Use Topics  . Smoking status: Never Smoker   . Smokeless tobacco: Never Used  . Alcohol Use: No    Allergies:  Allergies  Allergen Reactions  . Codeine Itching  . Peanut-Containing Drug Products Itching  . Penicillins Hives  . Shellfish Allergy Itching    Prescriptions prior to admission  Medication Sig Dispense Refill  . amLODipine (NORVASC) 10 MG tablet Take 10 mg by mouth every evening.       Marland Kitchen ibuprofen (ADVIL,MOTRIN) 200 MG tablet Take 200 mg by mouth every 4 (four) hours as needed for moderate pain. For pain       . norgestimate-ethinyl estradiol (ORTHO-CYCLEN,SPRINTEC,PREVIFEM) 0.25-35 MG-MCG tablet Take 1 tablet by mouth daily.  1 Package  11  . albuterol (PROVENTIL HFA;VENTOLIN HFA) 108 (90 BASE) MCG/ACT inhaler Inhale 2 puffs into the lungs every 4 (four) hours as needed for wheezing or shortness of breath. For cough/wheeze.        Review of Systems  Constitutional: Negative for fever.  Gastrointestinal: Positive for abdominal pain. Negative for nausea, vomiting, diarrhea and constipation.  Genitourinary: Negative for dysuria and urgency.       Vaginal bleeding.   Physical Exam   Height 5\' 5"  (1.651 m), weight 323 lb (146.512 kg), last menstrual period 11/28/2013.  P. 74  BP 153/75 and on recheck 162/82.  Physical Exam  Nursing note and vitals reviewed. Constitutional: She is oriented to person, place, and time. She appears well-developed and well-nourished.  Tearful in pain on first encounter  HENT:  Head: Normocephalic.  Eyes: EOM are normal.  Neck: Neck supple.  GI: Soft. There is tenderness. There is no rebound and no guarding.  obese  Musculoskeletal: Normal range of motion.  Neurological: She is alert and oriented to person, place,  and time.  Skin: Skin is warm and dry.  Psychiatric: She has a normal mood and affect.    MAU Course  Procedures  MDM Results for orders placed during the hospital encounter of 12/05/13 (from the past 24 hour(s))  URINALYSIS, ROUTINE W REFLEX MICROSCOPIC     Status: Abnormal   Collection Time    12/05/13 10:15 AM      Result Value Ref Range   Color, Urine RED (*) YELLOW   APPearance HAZY (*) CLEAR   Specific Gravity, Urine 1.025  1.005 - 1.030   pH 5.0  5.0 - 8.0   Glucose, UA 100 (*) NEGATIVE mg/dL   Hgb urine dipstick LARGE (*) NEGATIVE   Bilirubin Urine NEGATIVE  NEGATIVE   Ketones, ur 15 (*) NEGATIVE mg/dL   Protein, ur 100 (*) NEGATIVE mg/dL   Urobilinogen, UA 1.0  0.0 - 1.0 mg/dL   Nitrite POSITIVE (*) NEGATIVE   Leukocytes, UA  NEGATIVE  NEGATIVE  URINE MICROSCOPIC-ADD ON     Status: Abnormal   Collection Time    12/05/13 10:15 AM      Result Value Ref Range   Squamous Epithelial / LPF FEW (*) RARE   WBC, UA 0-2  <3 WBC/hpf   RBC / HPF TOO NUMEROUS TO COUNT  <3 RBC/hpf   Bacteria, UA FEW (*) RARE  CBC     Status: Abnormal   Collection Time    12/05/13 10:47 AM      Result Value Ref Range   WBC 6.1  4.0 - 10.5 K/uL   RBC 3.95  3.87 - 5.11 MIL/uL   Hemoglobin 10.3 (*) 12.0 - 15.0 g/dL   HCT 32.3 (*) 36.0 - 46.0 %   MCV 81.8  78.0 - 100.0 fL   MCH 26.1  26.0 - 34.0 pg   MCHC 31.9  30.0 - 36.0 g/dL   RDW 13.8  11.5 - 15.5 %   Platelets 261  150 - 400 K/uL   Reports she takes her blood pressure medication nightly.  Assessment and Plan  Heavy vaginal bleeding Elevated BP  Plan Urine culture pending - client does not think she has a UTI. Continue Sprintec as prescribed Call the clinic if your bleeding continues.   Gelila Well L Adaly Puder 12/05/2013, 11:06 AM

## 2013-12-05 NOTE — MAU Note (Signed)
Pt states here for heavy vaginal bleeding since Saturday. Hx irregular cycles. No cycle since September then MD put pt on birth control pills. Changing pad hourly at present. Bleeding lightens up usually in late afternoon.

## 2013-12-07 ENCOUNTER — Telehealth: Payer: Self-pay | Admitting: *Deleted

## 2013-12-07 DIAGNOSIS — N938 Other specified abnormal uterine and vaginal bleeding: Secondary | ICD-10-CM

## 2013-12-07 LAB — URINE CULTURE: SPECIAL REQUESTS: NORMAL

## 2013-12-07 LAB — POCT PREGNANCY, URINE: Preg Test, Ur: NEGATIVE

## 2013-12-07 MED ORDER — MEGESTROL ACETATE 40 MG PO TABS: 80.0000 mg | ORAL_TABLET | Freq: Two times a day (BID) | ORAL | Status: DC

## 2013-12-07 NOTE — Telephone Encounter (Signed)
Molly Archer called and left message that she has had several visits recently and last week was seen by Dr. Roselie Awkward and put on sprintec to help regulate her period since she hasn't had a period since September.  States her concern is her period started Saturday and is very heavy, changing pad every hour, clots. Molly Archer they told her that her lining was thick so she expected it to be heavy, but c/o that it is so painful. States she called the nurses last week and they told her if it got worse to come to MAu so she did come to MAU Saturday and they gave her pain shot and meds and told her to come back if it continued for a week.  States can't work- is painful, doesn't want to to go to MAU. Discussed patient complaints with Dr. Harolyn Rutherford and reviewed chart with Dr. Harolyn Rutherford.  Orders given to stop sprintec and start megace and give patient an appointment to be seen - needs biopsy. Called Molly Archer and she states her bleeding is a little better right now. I instructed her to stop the sprintec and start megace 80 mg bid- if bleeding not improved within 2 days increase to TID. Then when bleeding stops decrease to BID until seen in clinic. Also discussed come to MAU if bleeding heavy, feels lightheaded/dizzy. Molly Archer voices understanding.

## 2013-12-08 ENCOUNTER — Other Ambulatory Visit: Payer: Self-pay | Admitting: *Deleted

## 2013-12-08 ENCOUNTER — Inpatient Hospital Stay (HOSPITAL_COMMUNITY)
Admission: AD | Admit: 2013-12-08 | Discharge: 2013-12-08 | Disposition: A | Discharge status: Home / Self Care | Payer: BC Managed Care – PPO | Source: Ambulatory Visit | Attending: Obstetrics & Gynecology | Admitting: Obstetrics & Gynecology

## 2013-12-08 ENCOUNTER — Encounter (HOSPITAL_COMMUNITY): Payer: Self-pay | Admitting: *Deleted

## 2013-12-08 DIAGNOSIS — N946 Dysmenorrhea, unspecified: Secondary | ICD-10-CM | POA: Insufficient documentation

## 2013-12-08 DIAGNOSIS — N926 Irregular menstruation, unspecified: Secondary | ICD-10-CM | POA: Insufficient documentation

## 2013-12-08 DIAGNOSIS — N939 Abnormal uterine and vaginal bleeding, unspecified: Secondary | ICD-10-CM

## 2013-12-08 DIAGNOSIS — N898 Other specified noninflammatory disorders of vagina: Secondary | ICD-10-CM

## 2013-12-08 DIAGNOSIS — I10 Essential (primary) hypertension: Secondary | ICD-10-CM | POA: Insufficient documentation

## 2013-12-08 DIAGNOSIS — N949 Unspecified condition associated with female genital organs and menstrual cycle: Secondary | ICD-10-CM | POA: Insufficient documentation

## 2013-12-08 DIAGNOSIS — N938 Other specified abnormal uterine and vaginal bleeding: Secondary | ICD-10-CM

## 2013-12-08 LAB — URINALYSIS, ROUTINE W REFLEX MICROSCOPIC
Bilirubin Urine: NEGATIVE
GLUCOSE, UA: NEGATIVE mg/dL
KETONES UR: NEGATIVE mg/dL
Leukocytes, UA: NEGATIVE
Nitrite: NEGATIVE
PROTEIN: 30 mg/dL — AB
Specific Gravity, Urine: 1.015 (ref 1.005–1.030)
Urobilinogen, UA: 1 mg/dL (ref 0.0–1.0)
pH: 7.5 (ref 5.0–8.0)

## 2013-12-08 LAB — CBC
HEMATOCRIT: 32.6 % — AB (ref 36.0–46.0)
HEMOGLOBIN: 10.4 g/dL — AB (ref 12.0–15.0)
MCH: 26.2 pg (ref 26.0–34.0)
MCHC: 31.9 g/dL (ref 30.0–36.0)
MCV: 82.1 fL (ref 78.0–100.0)
Platelets: 272 10*3/uL (ref 150–400)
RBC: 3.97 MIL/uL (ref 3.87–5.11)
RDW: 13.9 % (ref 11.5–15.5)
WBC: 5.5 10*3/uL (ref 4.0–10.5)

## 2013-12-08 LAB — URINE MICROSCOPIC-ADD ON

## 2013-12-08 LAB — POCT PREGNANCY, URINE: Preg Test, Ur: NEGATIVE

## 2013-12-08 MED ORDER — HYDROCODONE-ACETAMINOPHEN 5-325 MG PO TABS: 1.0000 | ORAL_TABLET | Freq: Four times a day (QID) | ORAL | Status: DC | PRN

## 2013-12-08 MED ORDER — MEGESTROL ACETATE 40 MG PO TABS: 80.0000 mg | ORAL_TABLET | Freq: Two times a day (BID) | ORAL | Status: DC

## 2013-12-08 MED ORDER — IBUPROFEN 800 MG PO TABS: 800.0000 mg | ORAL_TABLET | Freq: Four times a day (QID) | ORAL | Status: DC | PRN

## 2013-12-08 NOTE — MAU Provider Note (Signed)
Attestation of Attending Supervision of Fellow: Evaluation and management procedures were performed by the Fellow under my supervision and collaboration.  I have reviewed the Fellow's note and chart, and I agree with the management and plan.    

## 2013-12-08 NOTE — Discharge Instructions (Signed)
Abnormal Uterine Bleeding Abnormal uterine bleeding means bleeding from the vagina that is not your normal menstrual period. This can be:  Bleeding or spotting between periods.  Bleeding after sex (sexual intercourse).  Bleeding that is heavier or more than normal.  Periods that last longer than usual.  Bleeding after menopause. There are many problems that may cause this. Treatment will depend on the cause of the bleeding. Any kind of bleeding that is not normal should be reviewed by your doctor.  HOME CARE Watch your condition for any changes. These actions may lessen any discomfort you are having:  Do not use tampons or douches as told by your doctor.  Change your pads often. You should get regular pelvic exams and Pap tests. Keep all appointments for tests as told by your doctor. GET HELP IF:  You are bleeding for more than 1 week.  You feel dizzy at times. GET HELP RIGHT AWAY IF:   You pass out.  You have to change pads every 15 to 30 minutes.  You have belly pain.  You have a fever.  You become sweaty or weak.  You are passing large blood clots from the vagina.  You feel sick to your stomach (nauseous) and throw up (vomit). MAKE SURE YOU:  Understand these instructions.  Will watch your condition.  Will get help right away if you are not doing well or get worse. Document Released: 05/13/2009 Document Revised: 05/06/2013 Document Reviewed: 02/12/2013 ExitCare Patient Information 2014 [employer].  

## 2013-12-08 NOTE — MAU Provider Note (Signed)
History     CSN: [id number]  Arrival date and time: 12/08/13 4580   First Provider Initiated Contact with Patient 12/08/13 1001      Chief Complaint  Patient presents with  . Vaginal Bleeding   HPI Comments: KLARISA BARMAN is a 33 y.o. with history of amenorrhea who presents with vaginal bleeding and pain. She began OCP on 05/02 for return of menstruation and experienced abundant vaginal bleeding and intense pelvic pain. She was last seen in MAU on 05/09 bleeding and pain, and was prescribed Toradol and switched OCP (Sprintec) to Megace. She has not yet begun Megace due to a dosing discrepancy, but correct rx was called in last night. Until yesterday, pt states she was saturating pads and had to change every hour. Yesterday, pt states bleeding decreased a little, but pain level has persisted. This morning, she has only had to change her pad once, and pain may be slightly decreased overall. Pt becomes teary when talking about how pain has kept her away from work.     Past Medical History  Diagnosis Date  . Asthma   . Hypertension   . Hx of Bell's palsy June 2013  . Psoriasis     Past Surgical History  Procedure Laterality Date  . Combined hysteroscopy diagnostic / d&c    . Dilation and curettage of uterus    . Dilation and curettage of uterus      Family History  Problem Relation Age of Onset  . Hypertension Other   . Diabetes Other   . Cancer Other     History  Substance Use Topics  . Smoking status: Never Smoker   . Smokeless tobacco: Never Used  . Alcohol Use: No    Allergies:  Allergies  Allergen Reactions  . Codeine Itching  . Peanut-Containing Drug Products Itching  . Penicillins Hives  . Shellfish Allergy Itching    Prescriptions prior to admission  Medication Sig Dispense Refill  . amLODipine (NORVASC) 10 MG tablet Take 10 mg by mouth every evening.       Marland Kitchen ketorolac (TORADOL) 10 MG tablet Take 1 tablet (10 mg total) by mouth every 6 (six) hours  as needed (Do not take Ibuprofen with this medication.).  20 tablet  0  . norgestimate-ethinyl estradiol (ORTHO-CYCLEN,SPRINTEC,PREVIFEM) 0.25-35 MG-MCG tablet Take 1 tablet by mouth daily.  1 Package  11  . albuterol (PROVENTIL HFA;VENTOLIN HFA) 108 (90 BASE) MCG/ACT inhaler Inhale 2 puffs into the lungs every 4 (four) hours as needed for wheezing or shortness of breath. For cough/wheeze.      . megestrol (MEGACE) 40 MG tablet Take 2 tablets (80 mg total) by mouth 2 (two) times daily.  60 tablet  5    Review of Systems  Constitutional: Positive for malaise/fatigue. Negative for fever and chills.  Gastrointestinal: Negative for nausea, vomiting, diarrhea and constipation.  Genitourinary: Negative for dysuria, urgency, frequency, hematuria and flank pain.  Neurological: Positive for headaches. Negative for dizziness.   Physical Exam   Blood pressure 140/70, pulse 75, temperature 98.4 F (36.9 C), temperature source Oral, resp. rate 18, height 5\' 5"  (1.651 m), weight 146.693 kg (323 lb 6.4 oz), last menstrual period 11/28/2013.  Physical Exam  Constitutional: She appears well-developed.  HENT:  Head: Normocephalic.  Cardiovascular: Normal heart sounds.   Respiratory: Breath sounds normal.  GI: Soft.  Genitourinary: Uterus normal. Uterus is not enlarged and not tender. Cervix exhibits discharge (minimal bloody discharge). Cervix exhibits no motion tenderness. There is  bleeding (small amount of bright red blood without clots) around the vagina.  Skin: Skin is warm and dry.    MAU Course  Procedures  MDM CBC, UPT, urinalysis, speculum exam  Assessment and Plan  A: Dysmenorrhea  P: Switch Toradol to ibuprofen, discharge with Vicoden, continue with Sprintec and do not begin taking Megace, f/u in clinic for bleeding and dysmenorrhea  Ricka Burdock, PA student   I have seen and examined this patient and agree with above documentation in the PA student's note. Pt is a 33 yo with  a pmhx of HTN and chronic irregular cycles who presents today for vaginal bleeding and cramping.  Pt has a hx of irregular bleeding and has required D&Cs x2 in the past. Was seen in the office for an amenorrhea workup and found to have a 54mm stripe. She was started on OCPs at that time with plans to repeat US after 3 months.  States that after starting the OCPs she began to bleed and have severe abdominal cramping.  Has been on her cycle for 8 days now.  Called the clinic yesterday and was given an RX of megace but has not filled it.  Pt states bleeding has markedly slowed today and pain has improved some but is still severe adn keeping her from work.  Exam showed a nontender and appropriately sized uterus, no signs of anemia and small amount of  bleeding in the vault.  No active bleeding.   Recommend ibuprofen for pain and vicodin prn for severe. Ok to not take megace as bleeding has slowed dramatically.  Cont OCPs and keep f/u appt as scheduled in clinic. Return precautions discussed.     Ebbie Latus, M.D. [facility] Fellow 12/08/2013 2:32 PM

## 2013-12-08 NOTE — MAU Note (Signed)
Pt now states she has only changed one pad this morning.

## 2013-12-08 NOTE — MAU Note (Signed)
Pt seen in MAU on Saturday for bleeding, has been taking Sprintec for approx 10 days, has not slowed bleeding @ all.  Changing a pad every hour, passing clots,  has severe pelvic cramping.  Called clinic yesterday, was called in a prescription for megace but pharmacy was unable to fill the prescription.

## 2013-12-08 NOTE — Telephone Encounter (Signed)
Called pharmacy to verify.

## 2013-12-09 ENCOUNTER — Encounter (HOSPITAL_COMMUNITY): Payer: Self-pay

## 2013-12-09 ENCOUNTER — Inpatient Hospital Stay (HOSPITAL_COMMUNITY)
Admission: AD | Admit: 2013-12-09 | Discharge: 2013-12-10 | Disposition: A | Discharge status: Home / Self Care | Payer: BC Managed Care – PPO | Source: Ambulatory Visit | Attending: Obstetrics & Gynecology | Admitting: Obstetrics & Gynecology

## 2013-12-09 DIAGNOSIS — N949 Unspecified condition associated with female genital organs and menstrual cycle: Secondary | ICD-10-CM | POA: Insufficient documentation

## 2013-12-09 DIAGNOSIS — I10 Essential (primary) hypertension: Secondary | ICD-10-CM | POA: Insufficient documentation

## 2013-12-09 DIAGNOSIS — N939 Abnormal uterine and vaginal bleeding, unspecified: Secondary | ICD-10-CM | POA: Diagnosis present

## 2013-12-09 DIAGNOSIS — N912 Amenorrhea, unspecified: Secondary | ICD-10-CM | POA: Insufficient documentation

## 2013-12-09 DIAGNOSIS — R102 Pelvic and perineal pain: Secondary | ICD-10-CM

## 2013-12-09 DIAGNOSIS — R109 Unspecified abdominal pain: Secondary | ICD-10-CM | POA: Insufficient documentation

## 2013-12-09 DIAGNOSIS — N8500 Endometrial hyperplasia, unspecified: Secondary | ICD-10-CM

## 2013-12-09 DIAGNOSIS — N921 Excessive and frequent menstruation with irregular cycle: Secondary | ICD-10-CM

## 2013-12-09 HISTORY — DX: Excessive and frequent menstruation with irregular cycle: N92.1

## 2013-12-09 LAB — CBC
HCT: 33.8 % — ABNORMAL LOW (ref 36.0–46.0)
Hemoglobin: 10.9 g/dL — ABNORMAL LOW (ref 12.0–15.0)
MCH: 26.4 pg (ref 26.0–34.0)
MCHC: 32.2 g/dL (ref 30.0–36.0)
MCV: 81.8 fL (ref 78.0–100.0)
PLATELETS: 286 10*3/uL (ref 150–400)
RBC: 4.13 MIL/uL (ref 3.87–5.11)
RDW: 14.1 % (ref 11.5–15.5)
WBC: 8.1 10*3/uL (ref 4.0–10.5)

## 2013-12-09 NOTE — MAU Provider Note (Signed)
Chief Complaint: Pelvic Pain   First Provider Initiated Contact with Patient 12/10/13 0012     SUBJECTIVE HPI: Molly Archer is a 33 y.o. G0P0000 female who presents with worsening low abdominal pain and continued vaginal bleeding, getting lighter. Has been seen in maternity admissions twice in the past week for the same problem. Long history of menometrorrhagia sometimes associated with pelvic pain and long periods of amenorrhea. Has had 3 D&Cs for these problems. Pathology showed polyp, simple and complex hyperplasia and mild atypical hyperplasia in 2005. Hysteroscopy D&C performed 2011 showed benign secretory endometrium.   Was started on OCPs 2 weeks ago to stimulate a withdrawal bleed after 7 months of amenorrhea. Ultrasound showed a Abnormal thickened heterogeneous endometrial complex measuring 24 mm thick and small bilateral ovarian cysts.   Patient rates pain 10/10 on pain scale at worst, constant pelvic pressure. No improvement with ibuprofen. Was prescribed Vicodin last MAU visit. Took one, but started itching and was afraid to take any more. No hives, swelling of the throat or difficulty breathing. Pain is so severe that she has not been to work the past 3 days.  Last sexual intercourse 3 years ago.  Past Medical History  Diagnosis Date  . Asthma   . Hypertension   . Hx of Bell's palsy June 2013  . Psoriasis    OB History  Gravida Para Term Preterm AB SAB TAB Ectopic Multiple Living  0 0 0 0 0 0 0 0 0 0        Past Surgical History  Procedure Laterality Date  . Combined hysteroscopy diagnostic / d&c    . Dilation and curettage of uterus    . Dilation and curettage of uterus     History   Social History  . Marital Status: Single    Spouse Name: N/A    Number of Children: N/A  . Years of Education: N/A   Occupational History  . Not on file.   Social History Main Topics  . Smoking status: Never Smoker   . Smokeless tobacco: Never Used  . Alcohol Use: No  .  Drug Use: No  . Sexual Activity: Not Currently    Birth Control/ Protection: None   Other Topics Concern  . Not on file   Social History Narrative  . No narrative on file   No current facility-administered medications on file prior to encounter.   Current Outpatient Prescriptions on File Prior to Encounter  Medication Sig Dispense Refill  . amLODipine (NORVASC) 10 MG tablet Take 10 mg by mouth every evening.       Marland Kitchen HYDROcodone-acetaminophen (NORCO/VICODIN) 5-325 MG per tablet Take 1 tablet by mouth every 6 (six) hours as needed.  15 tablet  0  . norgestimate-ethinyl estradiol (ORTHO-CYCLEN,SPRINTEC,PREVIFEM) 0.25-35 MG-MCG tablet Take 1 tablet by mouth daily.  1 Package  11  . albuterol (PROVENTIL HFA;VENTOLIN HFA) 108 (90 BASE) MCG/ACT inhaler Inhale 2 puffs into the lungs every 4 (four) hours as needed for wheezing or shortness of breath. For cough/wheeze.      Marland Kitchen ibuprofen (ADVIL,MOTRIN) 800 MG tablet Take 1 tablet (800 mg total) by mouth every 6 (six) hours as needed.  60 tablet  1   Allergies  Allergen Reactions  . Codeine Itching  . Peanut-Containing Drug Products Itching  . Penicillins Hives  . Shellfish Allergy Itching    ROS: Pertinent positive items in HPI. Also positive for occasional nausea and constipation. Negative for fever, chills, body aches, dysuria, urgency, frequency, hematuria, flank  pain, dizziness, vomiting, diarrhea, blood in stools.   OBJECTIVE Blood pressure 150/75, pulse 76, temperature 98.3 F (36.8 C), temperature source Oral, resp. rate 20, height 5\' 5"  (1.651 m), weight 146.512 kg (323 lb), last menstrual period 11/28/2013. GENERAL: Well-developed, well-nourished, morbidly obese female in moderate distress.  HEENT: Normocephalic HEART: normal rate RESP: normal effort ABDOMEN: Soft, mild suprapubic tenderness and left lower cautery and tenderness. Negative rebound. Difficult to assess mass due to body habitus. Positive bowel sounds x4. Negative CVA  tenderness. EXTREMITIES: Nontender, no edema NEURO: Alert and oriented SPECULUM EXAM: NEFG, small amount of dark red blood noted, cervix clean. Small? Polyp protruding from os. Removed with ring forceps the twisting motion. Scant bleeding to follow. BIMANUAL: cervix fingertip; uterus possibly slightly enlarged, exam limited by body habitus, no adnexal tenderness or masses. No cervical motion tenderness  LAB RESULTS Results for orders placed during the hospital encounter of 12/09/13 (from the past 24 hour(s))  URINALYSIS, ROUTINE W REFLEX MICROSCOPIC     Status: Abnormal   Collection Time    12/09/13 10:54 PM      Result Value Ref Range   Color, Urine ORANGE (*) YELLOW   APPearance CLEAR  CLEAR   Specific Gravity, Urine 1.015  1.005 - 1.030   pH 6.0  5.0 - 8.0   Glucose, UA NEGATIVE  NEGATIVE mg/dL   Hgb urine dipstick LARGE (*) NEGATIVE   Bilirubin Urine NEGATIVE  NEGATIVE   Ketones, ur NEGATIVE  NEGATIVE mg/dL   Protein, ur NEGATIVE  NEGATIVE mg/dL   Urobilinogen, UA 0.2  0.0 - 1.0 mg/dL   Nitrite NEGATIVE  NEGATIVE   Leukocytes, UA NEGATIVE  NEGATIVE  URINE MICROSCOPIC-ADD ON     Status: Abnormal   Collection Time    12/09/13 10:54 PM      Result Value Ref Range   Squamous Epithelial / LPF RARE  RARE   WBC, UA 0-2  <3 WBC/hpf   RBC / HPF TOO NUMEROUS TO COUNT  <3 RBC/hpf   Bacteria, UA FEW (*) RARE  CBC     Status: Abnormal   Collection Time    12/09/13 11:35 PM      Result Value Ref Range   WBC 8.1  4.0 - 10.5 K/uL   RBC 4.13  3.87 - 5.11 MIL/uL   Hemoglobin 10.9 (*) 12.0 - 15.0 g/dL   HCT 33.8 (*) 36.0 - 46.0 %   MCV 81.8  78.0 - 100.0 fL   MCH 26.4  26.0 - 34.0 pg   MCHC 32.2  30.0 - 36.0 g/dL   RDW 14.1  11.5 - 15.5 %   Platelets 286  150 - 400 K/uL  URINALYSIS, ROUTINE W REFLEX MICROSCOPIC     Status: None   Collection Time    12/10/13  1:50 AM      Result Value Ref Range   Color, Urine YELLOW  YELLOW   APPearance CLEAR  CLEAR   Specific Gravity, Urine 1.020   1.005 - 1.030   pH 6.0  5.0 - 8.0   Glucose, UA NEGATIVE  NEGATIVE mg/dL   Hgb urine dipstick NEGATIVE  NEGATIVE   Bilirubin Urine NEGATIVE  NEGATIVE   Ketones, ur NEGATIVE  NEGATIVE mg/dL   Protein, ur NEGATIVE  NEGATIVE mg/dL   Urobilinogen, UA 0.2  0.0 - 1.0 mg/dL   Nitrite NEGATIVE  NEGATIVE   Leukocytes, UA NEGATIVE  NEGATIVE  WET PREP, GENITAL     Status: None   Collection Time  12/10/13  2:50 AM      Result Value Ref Range   Yeast Wet Prep HPF POC NONE SEEN  NONE SEEN   Trich, Wet Prep NONE SEEN  NONE SEEN   Clue Cells Wet Prep HPF POC NONE SEEN  NONE SEEN   WBC, Wet Prep HPF POC NONE SEEN  NONE SEEN   UA repeated with catheter specimen due to contamination.   IMAGING US Transvaginal Non-ob  12/10/2013   CLINICAL DATA:  Pelvic pain and vaginal bleeding. Known endometrial hyperplasia versus polyp.  EXAM: TRANSVAGINAL ULTRASOUND OF PELVIS  TECHNIQUE: Transvaginal ultrasound examination of the pelvis was performed including evaluation of the uterus, ovaries, adnexal regions, and pelvic cul-de-sac.  COMPARISON:  US PELVIS COMPLETE dated 11/10/2013  FINDINGS: Uterus  Measurements: 8.8 x 6 x 6.2 cm, retroverted. No fibroids or other mass visualized.  Endometrium  Thickness: 2.8 cm. Diffusely thickened and heterogeneous in echotexture similar to prior study. Increase flow is demonstrated in the endometrium on color flow Doppler imaging. No fluid is seen. Changes could represent endometrial hyperplasia, neoplasm, or endometrial polyps.  Right ovary  Right ovary is not visualized.  Areas obscured by bowel gas.  Left ovary  Measurements: 2.1 x 1.7 x 2 cm. Normal appearance/no adnexal mass.  Other findings:  No free fluid  IMPRESSION: Heterogeneous thickened endometrium with increased flow demonstrated. Differential diagnosis would include hyperplasia, neoplasm, or polyps. Right ovary is not visualized. Left ovary is unremarkable. If bleeding remains unresponsive to hormonal or medical therapy,  focal lesion work-up with sonohysterogram should be considered. Endometrial biopsy should also be considered in pre-menopausal patients at high risk for endometrial carcinoma. (Ref: Radiological Reasoning: Algorithmic Workup of Abnormal Vaginal Bleeding with Endovaginal Sonography and Sonohysterography. AJR 2008; 893:Y10-17)   Electronically Signed   By: Lucienne Capers M.D.   On: 12/10/2013 04:54   US Transvaginal Non-ob  11/10/2013   CLINICAL DATA:  Absence of menstruation, amenorrhea since 04/28/2013, past history of the indices due to persistent bleeding, endometrial polyps  EXAM: TRANSABDOMINAL AND TRANSVAGINAL ULTRASOUND OF PELVIS  TECHNIQUE: Both transabdominal and transvaginal ultrasound examinations of the pelvis were performed. Transabdominal technique was performed for global imaging of the pelvis including uterus, ovaries, adnexal regions, and pelvic cul-de-sac. It was necessary to proceed with endovaginal exam following the transabdominal exam to visualize the endometrium.  COMPARISON:  02/03/2010  FINDINGS: Uterus  Measurements: 7.3 x 5.6 x 6.5 cm. Retroverted. Complex cystic nodule within myometrium 6 x 6 x 6 mm, not seen on previous exam.  Endometrium  Thickness: 24 mm thick, abnormal. Heterogeneous appearance with tiny cystic areas.  Right ovary  Measurements: 3.6 x 2.6 x 2.7 cm. Dominant cyst 2.3 x 2.0 x 2.1 cm.  Left ovary  Measurements: 4.4 x 3.2 x 2.7 cm. Dominant cyst 3.3 x 2.7 x 2.5 cm  Other findings  No free pelvic fluid or additional adnexal masses  IMPRESSION: Abnormal thickened heterogeneous endometrial complex measuring 24 mm thick.  Differential diagnosis includes hyperplasia, polyp, and tumor.  Consider follow up in 6-8 weeks or tissue diagnosis.  Small bilateral ovarian cysts.  Retroverted uterus.   Electronically Signed   By: Lavonia Dana M.D.   On: 11/10/2013 16:51   US Pelvis Complete  11/10/2013   CLINICAL DATA:  Absence of menstruation, amenorrhea since 04/28/2013, past  history of the indices due to persistent bleeding, endometrial polyps  EXAM: TRANSABDOMINAL AND TRANSVAGINAL ULTRASOUND OF PELVIS  TECHNIQUE: Both transabdominal and transvaginal ultrasound examinations of the pelvis were performed.  Transabdominal technique was performed for global imaging of the pelvis including uterus, ovaries, adnexal regions, and pelvic cul-de-sac. It was necessary to proceed with endovaginal exam following the transabdominal exam to visualize the endometrium.  COMPARISON:  02/03/2010  FINDINGS: Uterus  Measurements: 7.3 x 5.6 x 6.5 cm. Retroverted. Complex cystic nodule within myometrium 6 x 6 x 6 mm, not seen on previous exam.  Endometrium  Thickness: 24 mm thick, abnormal. Heterogeneous appearance with tiny cystic areas.  Right ovary  Measurements: 3.6 x 2.6 x 2.7 cm. Dominant cyst 2.3 x 2.0 x 2.1 cm.  Left ovary  Measurements: 4.4 x 3.2 x 2.7 cm. Dominant cyst 3.3 x 2.7 x 2.5 cm  Other findings  No free pelvic fluid or additional adnexal masses  IMPRESSION: Abnormal thickened heterogeneous endometrial complex measuring 24 mm thick.  Differential diagnosis includes hyperplasia, polyp, and tumor.  Consider follow up in 6-8 weeks or tissue diagnosis.  Small bilateral ovarian cysts.  Retroverted uterus.   Electronically Signed   By: Lavonia Dana M.D.   On: 11/10/2013 16:51   MAU COURSE CBC, UA, dilaudid, pelvic ultrasound, GC/CT cultures/wet prep.  ASSESSMENT 1. Endometrial hyperplasia   2. Menometrorrhagia   3. Pelvic pain in female    PLAN Discharge home in stable condition per consult with Dr.Eure. Specimen to pathology. Lengthy conversation with patient about importance of staying on OCPs,  Mirena or Depo-Provera to prevent endometrial hyperplasia and discussed how hyperplasia increase his risk of endometrial cancer. Also discussed how weight loss may help with anovulatory bleeding. Patient tearful, but receptive. Follow-up Information   Follow up with [facility]  12/10/2013. (At 3 PM)    Contact information:   [place] [address] [phone]       Follow up with [facility]. (As needed in emergencies)    Contact information:   [id number] [place] [address] [phone]        Medication List    STOP taking these medications       HYDROcodone-acetaminophen 5-325 MG per tablet  Commonly known as:  NORCO/VICODIN      TAKE these medications       albuterol 108 (90 BASE) MCG/ACT inhaler  Commonly known as:  PROVENTIL HFA;VENTOLIN HFA  Inhale 2 puffs into the lungs every 4 (four) hours as needed for wheezing or shortness of breath. For cough/wheeze.     amLODipine 10 MG tablet  Commonly known as:  NORVASC  Take 10 mg by mouth every evening.     HYDROmorphone 2 MG tablet  Commonly known as:  DILAUDID  Take 1 tablet (2 mg total) by mouth every 4 (four) hours as needed for severe pain.     ibuprofen 800 MG tablet  Commonly known as:  ADVIL,MOTRIN  Take 1 tablet (800 mg total) by mouth every 6 (six) hours as needed.     norgestimate-ethinyl estradiol 0.25-35 MG-MCG tablet  Commonly known as:  ORTHO-CYCLEN,SPRINTEC,PREVIFEM  Take 1 tablet by mouth daily.     polyethylene glycol packet  Commonly known as:  MIRALAX / GLYCOLAX  Take 17 g by mouth daily.       [place], [place] 12/09/2013  11:28 PM

## 2013-12-09 NOTE — MAU Note (Signed)
Pt seen in MAU yesterday. States pain is much worse. States she still has vaginal bleeding. States it has slowed down this evening.

## 2013-12-09 NOTE — MAU Note (Signed)
Tried to I&O cath pt x2 with NT assistance. Pt states that my fingers and the betadine swabs hurt her. Marlou Porch CNM notified

## 2013-12-10 ENCOUNTER — Inpatient Hospital Stay (HOSPITAL_COMMUNITY): Payer: BC Managed Care – PPO

## 2013-12-10 ENCOUNTER — Encounter (HOSPITAL_COMMUNITY): Payer: Self-pay | Admitting: Advanced Practice Midwife

## 2013-12-10 ENCOUNTER — Ambulatory Visit (INDEPENDENT_AMBULATORY_CARE_PROVIDER_SITE_OTHER): Payer: BC Managed Care – PPO | Admitting: Obstetrics & Gynecology

## 2013-12-10 VITALS — BP 137/80 | HR 82 | Temp 99.9°F | Ht 65.0 in | Wt 318.8 lb

## 2013-12-10 DIAGNOSIS — N939 Abnormal uterine and vaginal bleeding, unspecified: Secondary | ICD-10-CM | POA: Diagnosis present

## 2013-12-10 DIAGNOSIS — N938 Other specified abnormal uterine and vaginal bleeding: Secondary | ICD-10-CM

## 2013-12-10 DIAGNOSIS — N8500 Endometrial hyperplasia, unspecified: Secondary | ICD-10-CM

## 2013-12-10 DIAGNOSIS — N949 Unspecified condition associated with female genital organs and menstrual cycle: Secondary | ICD-10-CM

## 2013-12-10 DIAGNOSIS — N925 Other specified irregular menstruation: Secondary | ICD-10-CM

## 2013-12-10 LAB — URINALYSIS, ROUTINE W REFLEX MICROSCOPIC
BILIRUBIN URINE: NEGATIVE
Bilirubin Urine: NEGATIVE
Glucose, UA: NEGATIVE mg/dL
Glucose, UA: NEGATIVE mg/dL
HGB URINE DIPSTICK: NEGATIVE
KETONES UR: NEGATIVE mg/dL
Ketones, ur: NEGATIVE mg/dL
LEUKOCYTES UA: NEGATIVE
Leukocytes, UA: NEGATIVE
NITRITE: NEGATIVE
NITRITE: NEGATIVE
PH: 6 (ref 5.0–8.0)
PROTEIN: NEGATIVE mg/dL
Protein, ur: NEGATIVE mg/dL
SPECIFIC GRAVITY, URINE: 1.015 (ref 1.005–1.030)
Specific Gravity, Urine: 1.02 (ref 1.005–1.030)
UROBILINOGEN UA: 0.2 mg/dL (ref 0.0–1.0)
Urobilinogen, UA: 0.2 mg/dL (ref 0.0–1.0)
pH: 6 (ref 5.0–8.0)

## 2013-12-10 LAB — WET PREP, GENITAL
Clue Cells Wet Prep HPF POC: NONE SEEN
Trich, Wet Prep: NONE SEEN
WBC WET PREP: NONE SEEN
Yeast Wet Prep HPF POC: NONE SEEN

## 2013-12-10 LAB — GC/CHLAMYDIA PROBE AMP
CT PROBE, AMP APTIMA: NEGATIVE
GC PROBE AMP APTIMA: NEGATIVE

## 2013-12-10 LAB — URINE MICROSCOPIC-ADD ON

## 2013-12-10 MED ORDER — HYDROMORPHONE HCL PF 2 MG/ML IJ SOLN
2.0000 mg | Freq: Once | INTRAMUSCULAR | Status: AC
  Administered 2013-12-10: 2 mg via INTRAMUSCULAR
  Filled 2013-12-10: qty 1

## 2013-12-10 MED ORDER — MEGESTROL ACETATE 40 MG PO TABS: 40.0000 mg | ORAL_TABLET | Freq: Two times a day (BID) | ORAL | Status: DC

## 2013-12-10 MED ORDER — HYDROMORPHONE HCL 2 MG PO TABS: 2.0000 mg | ORAL_TABLET | ORAL | Status: DC | PRN

## 2013-12-10 MED ORDER — HYDROMORPHONE HCL 2 MG PO TABS
2.0000 mg | ORAL_TABLET | Freq: Once | ORAL | Status: AC
  Administered 2013-12-10: 2 mg via ORAL
  Filled 2013-12-10: qty 1

## 2013-12-10 MED ORDER — POLYETHYLENE GLYCOL 3350 17 G PO PACK: 17.0000 g | PACK | Freq: Every day | ORAL | Status: DC

## 2013-12-10 MED ORDER — HYDROMORPHONE HCL PF 2 MG/ML IJ SOLN: 2.0000 mg | Freq: Once | INTRAMUSCULAR | Status: DC | PRN

## 2013-12-10 NOTE — Patient Instructions (Signed)

## 2013-12-10 NOTE — Discharge Instructions (Signed)
Abnormal Uterine Bleeding Abnormal uterine bleeding can affect women at various stages in life, including teenagers, women in their reproductive years, pregnant women, and women who have reached menopause. Several kinds of uterine bleeding are considered abnormal, including:  Bleeding or spotting between periods.   Bleeding after sexual intercourse.   Bleeding that is heavier or more than normal.   Periods that last longer than usual.  Bleeding after menopause.  Many cases of abnormal uterine bleeding are minor and simple to treat, while others are more serious. Any type of abnormal bleeding should be evaluated by your health care provider. Treatment will depend on the cause of the bleeding. HOME CARE INSTRUCTIONS Monitor your condition for any changes. The following actions may help to alleviate any discomfort you are experiencing:  Avoid the use of tampons and douches as directed by your health care provider.  Change your pads frequently. You should get regular pelvic exams and Pap tests. Keep all follow-up appointments for diagnostic tests as directed by your health care provider.  SEEK MEDICAL CARE IF:   Your bleeding lasts more than 1 week.   You feel dizzy at times.  SEEK IMMEDIATE MEDICAL CARE IF:   You pass out.   You are changing pads every 15 to 30 minutes.   You have abdominal pain.  You have a fever.   You become sweaty or weak.   You are passing large blood clots from the vagina.   You start to feel nauseous and vomit. MAKE SURE YOU:   Understand these instructions.  Will watch your condition.  Will get help right away if you are not doing well or get worse. Document Released: 07/16/2005 Document Revised: 03/18/2013 Document Reviewed: 02/12/2013 [facility] Patient Information 2014 [place], [place].  Pelvic Pain, Female Female pelvic pain can be caused by many different things and start from a variety of places. Pelvic pain refers to pain that is  located in the lower half of the abdomen and between your hips. The pain may occur over a short period of time (acute) or may be reoccurring (chronic). The cause of pelvic pain may be related to disorders affecting the female reproductive organs (gynecologic), but it may also be related to the bladder, kidney stones, an intestinal complication, or muscle or skeletal problems. Getting help right away for pelvic pain is important, especially if there has been severe, sharp, or a sudden onset of unusual pain. It is also important to get help right away because some types of pelvic pain can be life threatening.  CAUSES  Below are only some of the causes of pelvic pain. The causes of pelvic pain can be in one of several categories.   Gynecologic.  Pelvic inflammatory disease.  Sexually transmitted infection.  Ovarian cyst or a twisted ovarian ligament (ovarian torsion).  Uterine lining that grows outside the uterus (endometriosis).  Fibroids, cysts, or tumors.  Ovulation.  Pregnancy.  Pregnancy that occurs outside the uterus (ectopic pregnancy).  Miscarriage.  Labor.  Abruption of the placenta or ruptured uterus.  Infection.  Uterine infection (endometritis).  Bladder infection.  Diverticulitis.  Miscarriage related to a uterine infection (septic abortion).  Bladder.  Inflammation of the bladder (cystitis).  Kidney stone(s).  Gastrointenstinal.  Constipation.  Diverticulitis.  Neurologic.  Trauma.  Feeling pelvic pain because of mental or emotional causes (psychosomatic).  Cancers of the bowel or pelvis. EVALUATION  Your caregiver will want to take a careful history of your concerns. This includes recent changes in your health, a  careful gynecologic history of your periods (menses), and a sexual history. Obtaining your family history and medical history is also important. Your caregiver may suggest a pelvic exam. A pelvic exam will help identify the location and  severity of the pain. It also helps in the evaluation of which organ system may be involved. In order to identify the cause of the pelvic pain and be properly treated, your caregiver may order tests. These tests may include:   A pregnancy test.  Pelvic ultrasonography.  An X-ray exam of the abdomen.  A urinalysis or evaluation of vaginal discharge.  Blood tests. HOME CARE INSTRUCTIONS   Only take over-the-counter or prescription medicines for pain, discomfort, or fever as directed by your caregiver.   Rest as directed by your caregiver.   Eat a balanced diet.   Drink enough fluids to make your urine clear or pale yellow, or as directed.   Avoid sexual intercourse if it causes pain.   Apply warm or cold compresses to the lower abdomen depending on which one helps the pain.   Avoid stressful situations.   Keep a journal of your pelvic pain. Write down when it started, where the pain is located, and if there are things that seem to be associated with the pain, such as food or your menstrual cycle.  Follow up with your caregiver as directed.  SEEK MEDICAL CARE IF:  Your medicine does not help your pain.  You have abnormal vaginal discharge. SEEK IMMEDIATE MEDICAL CARE IF:   You have heavy bleeding from the vagina.   Your pelvic pain increases.   You feel lightheaded or faint.   You have chills.   You have pain with urination or blood in your urine.   You have uncontrolled diarrhea or vomiting.   You have a fever or persistent symptoms for more than 3 days.  You have a fever and your symptoms suddenly get worse.   You are being physically or sexually abused.  MAKE SURE YOU:  Understand these instructions.  Will watch your condition.  Will get help if you are not doing well or get worse. Document Released: 06/12/2004 Document Revised: 01/15/2012 Document Reviewed: 11/05/2011 [facility] Patient Information 2014 [place], [place].

## 2013-12-18 ENCOUNTER — Encounter: Payer: Self-pay | Admitting: Obstetrics & Gynecology

## 2013-12-18 NOTE — Progress Notes (Signed)
Subjective:     Patient ID: Molly Archer, female   DOB: September 25, 1980, 33 y.o.   MRN: [id number]  HPI Pt presents wit c/o irreg bleeding.  She was seen in the MAU and is here for a biopsy or eval.    Review of Systems     Objective:   Physical Exam BP 137/80  Pulse 82  Temp(Src) 99.9 F (37.7 C)  Ht 5\' 5"  (1.651 m)  Wt 318 lb 12.8 oz (144.607 kg)  BMI 53.05 kg/m2  LMP 11/28/2013 GU: EGBUS: no lesions Vagina: no blood in vault Cervix: polyp noted at cervical os no mucopurulent d/c      Assessment:     DUB- cervical polyp     Plan:     Megace 40mg  q day F/u 2 weeks for possible endobx

## 2013-12-18 NOTE — Progress Notes (Deleted)
Patient ID: Molly Archer, female   DOB: 1981-04-24, 33 y.o.   MRN: [id number] The indications for endometrial biopsy were reviewed.   Risks of the biopsy including cramping, bleeding, infection, uterine perforation, inadequate specimen and need for additional procedures  were discussed. The patient states she understands and agrees to undergo procedure today. Consent was signed. Time out was performed. Urine HCG was negative. A sterile speculum was placed in the patient's vagina and the cervix was prepped with Betadine. A single-toothed tenaculum was placed on the anterior lip of the cervix to stabilize it. The 3 mm pipelle was introduced into the endometrial cavity without difficulty to a depth of 9cm, and a moderate amount of tissue was obtained and sent to pathology. The instruments were removed from the patient's vagina. Minimal bleeding from the cervix was noted. The patient tolerated the procedure well. Routine post-procedure instructions were given to the patient. The patient will follow up to review the results and for further management.  Pt to take megace 40mg  q day.  f/u

## 2014-01-14 ENCOUNTER — Ambulatory Visit (INDEPENDENT_AMBULATORY_CARE_PROVIDER_SITE_OTHER): Payer: BC Managed Care – PPO | Admitting: Obstetrics & Gynecology

## 2014-01-14 ENCOUNTER — Other Ambulatory Visit (HOSPITAL_COMMUNITY)
Admission: RE | Admit: 2014-01-14 | Discharge: 2014-01-14 | Disposition: A | Discharge status: Home / Self Care | Payer: BC Managed Care – PPO | Source: Ambulatory Visit | Attending: Obstetrics & Gynecology | Admitting: Obstetrics & Gynecology

## 2014-01-14 ENCOUNTER — Encounter: Payer: Self-pay | Admitting: Obstetrics & Gynecology

## 2014-01-14 ENCOUNTER — Ambulatory Visit: Payer: BC Managed Care – PPO | Admitting: Obstetrics & Gynecology

## 2014-01-14 VITALS — BP 126/78 | HR 91 | Temp 98.8°F | Wt 320.4 lb

## 2014-01-14 DIAGNOSIS — N920 Excessive and frequent menstruation with regular cycle: Secondary | ICD-10-CM

## 2014-01-14 DIAGNOSIS — N921 Excessive and frequent menstruation with irregular cycle: Secondary | ICD-10-CM

## 2014-01-14 DIAGNOSIS — C541 Malignant neoplasm of endometrium: Secondary | ICD-10-CM | POA: Insufficient documentation

## 2014-01-14 DIAGNOSIS — N939 Abnormal uterine and vaginal bleeding, unspecified: Principal | ICD-10-CM | POA: Insufficient documentation

## 2014-01-14 DIAGNOSIS — Z1504 Genetic susceptibility to malignant neoplasm of endometrium: Secondary | ICD-10-CM

## 2014-01-14 DIAGNOSIS — C549 Malignant neoplasm of corpus uteri, unspecified: Secondary | ICD-10-CM

## 2014-01-14 DIAGNOSIS — N926 Irregular menstruation, unspecified: Secondary | ICD-10-CM | POA: Insufficient documentation

## 2014-01-14 LAB — POCT PREGNANCY, URINE: PREG TEST UR: NEGATIVE

## 2014-01-14 NOTE — Progress Notes (Signed)
Subjective:     Patient ID: Molly Archer, female   DOB: [birth year], 33 y.o.   MRN: [id number]  HPI Pt presents wit c/o irregular bleeding.  --Prior normal periods until last year in September then without periods --Started on OCPs  --Then started heavy bleeding starting in early May this year --Of note, she has had 3 D&Cs for these problems. Pathology showed polyp, simple and complex hyperplasia and mild atypical hyperplasia in 2005. Hysteroscopy D&C performed 2011 showed benign secretory endometrium.  Was started on OCPs 2 weeks ago to stimulate a withdrawal bleed after 7 months of amenorrhea.  --5/14 TV ultrasound showed a Abnormal thickened heterogeneous endometrial complex measuring 24 mm thick and small bilateral ovarian cysts.  (see formal report below)  --Placed on megace on 5/14 and bleeding stopped since taking for 1 week --Still taking megace 40 mg twice daily with good compliance.  Possible SE of dry mouth. --Lower middle abdominal pain "intense cramping" -- only present with bleeding, now resolved --She is following up to day to have endometrial biopsy to complete work up.   Review of Systems +cough, dry mouth     Objective:   Physical Exam BP 126/78  Pulse 91  Temp(Src) 98.8 F (37.1 C)  Wt 320 lb 6.4 oz (145.332 kg)  LMP 12/02/2013 GU: EGBUS: no lesions Vagina: no blood in vault Cervix: polyp noted at cervical os no mucopurulent d/c  Imaging: CLINICAL DATA: Pelvic pain and vaginal bleeding. Known endometrial  hyperplasia versus polyp.  EXAM:  TRANSVAGINAL ULTRASOUND OF PELVIS  TECHNIQUE:  Transvaginal ultrasound examination of the pelvis was performed  including evaluation of the uterus, ovaries, adnexal regions, and  pelvic cul-de-sac.  COMPARISON: US PELVIS COMPLETE dated 11/10/2013   FINDINGS:  Uterus  Measurements: 8.8 x 6 x 6.2 cm, retroverted. No fibroids or other  mass visualized.  Endometrium  Thickness: 2.8 cm. Diffusely thickened and  heterogeneous in  echotexture similar to prior study. Increase flow is demonstrated in  the endometrium on color flow Doppler imaging. No fluid is seen.  Changes could represent endometrial hyperplasia, neoplasm, or  endometrial polyps.  Right ovary  Right ovary is not visualized. Areas obscured by bowel gas.  Left ovary  Measurements: 2.1 x 1.7 x 2 cm. Normal appearance/no adnexal mass.  Other findings: No free fluid   IMPRESSION:  Heterogeneous thickened endometrium with increased flow  demonstrated. Differential diagnosis would include hyperplasia,  neoplasm, or polyps. Right ovary is not visualized. Left ovary is  unremarkable. If bleeding remains unresponsive to hormonal or  medical therapy, focal lesion work-up with sonohysterogram should be  considered. Endometrial biopsy should also be considered in  pre-menopausal patients at high risk for endometrial carcinoma.  (Ref: Radiological Reasoning: Algorithmic Workup of Abnormal Vaginal  Bleeding with Endovaginal Sonography and Sonohysterography. AJR  2008; 650:[id number])  Electronically Signed  By: Lucienne Capers M.D.  On: 12/10/2013 04:54      Assessment:     DUB -- cervical polyp     Plan:     Endometrial biopsy performed today (see procedure note below).  Will call with results Continue Megace 40mg  BID  Return to clinic to discuss options if want to conceive       Endometrial Biopsy Procedure Note: The indications for endometrial biopsy were reviewed.   Risks of the biopsy including cramping, bleeding, infection, uterine perforation, inadequate specimen and need for additional procedures  were discussed. The patient states she understands and agrees to undergo procedure today. Consent was signed.  Time out was performed. Urine HCG was negative. A sterile speculum was placed in the patient's vagina and the cervix was prepped with Betadine. A single-toothed tenaculum was placed on the anterior lip of the cervix to  stabilize it. The 3 mm pipelle was introduced into the endometrial cavity without difficulty to a depth of 10 cm, and a moderate amount of tissue was obtained and sent to pathology. The instruments were removed from the patient's vagina. Minimal bleeding from the cervix was noted. The patient tolerated the procedure well. Routine post-procedure instructions were given to the patient. The patient will follow up to review the results and for further management.    Attending present throughout procedure  [facility] L. Cherlynn June, MD, [place] of Attending Supervision of Resident: Evaluation and management procedures were performed by the [facility] Resident under my supervision.  I have seen and examined the patient, reviewed the resident's note and chart, and I agree with the management and plan.  Verita Schneiders, MD, [place] Attending [place], [place]

## 2014-01-14 NOTE — Patient Instructions (Signed)
Uncommon side effect of cough with Megace  Consider follow up with your primary care doctor about dry cough.  May need work up from [employer] and Throat vs. GI for silent reflux   Endometrial Biopsy Endometrial biopsy is a procedure in which a tissue sample is taken from inside the uterus. The tissue sample is then looked at under a microscope to see if the tissue is normal or abnormal. The endometrium is the lining of the uterus. This procedure helps determine where you are in your menstrual cycle and how hormone levels are affecting the lining of the uterus. This procedure may also be used to evaluate uterine bleeding or to diagnose endometrial cancer, tuberculosis, polyps, or inflammatory conditions.  LET [facility] CARE Molly Archer KNOW ABOUT:  Any allergies you have.  All medicines you are taking, including vitamins, herbs, eye drops, creams, and over-the-counter medicines.  Previous problems you or members of your family have had with the use of anesthetics.  Any blood disorders you have.  Previous surgeries you have had.  Medical conditions you have.  Possibility of pregnancy. RISKS AND COMPLICATIONS Generally, this is a safe procedure. However, as with any procedure, complications can occur. Possible complications include:  Bleeding.  Pelvic infection.  Puncture of the uterine wall with the biopsy device (rare). BEFORE THE PROCEDURE   Keep a record of your menstrual cycles as directed by your health care Molly Archer. You may need to schedule your procedure for a specific time in your cycle.  You may want to bring a sanitary pad to wear home after the procedure.  Arrange for someone to drive you home after the procedure if you will be given a medicine to help you relax (sedative). PROCEDURE   You may be given a sedative to relax you.  You will lie on an exam table with your feet and legs supported as in a pelvic exam.  Your health care Molly Archer will insert an instrument  (speculum) into your vagina to see your cervix.  Your cervix will be cleansed with an antiseptic solution. A medicine (local anesthetic) will be used to numb the cervix.  A forceps instrument (tenaculum) will be used to hold your cervix steady for the biopsy.  A thin, rodlike instrument (uterine sound) will be inserted through your cervix to determine the length of your uterus and the location where the biopsy sample will be removed.  A thin, flexible tube (catheter) will be inserted through your cervix and into the uterus. The catheter is used to collect the biopsy sample from your endometrial tissue.  The catheter and speculum will then be removed, and the tissue sample will be sent to a lab for examination. AFTER THE PROCEDURE  You will rest in a recovery area until you are ready to go home.  You may have mild cramping and a small amount of vaginal bleeding for a few days after the procedure. This is normal.  Make sure you find out how to get your test results. Document Released: 11/16/2004 Document Revised: 03/18/2013 Document Reviewed: 12/31/2012 [facility] Patient Information 2015 [place], [place]. This information is not intended to replace advice given to you by your health care Molly Archer. Make sure you discuss any questions you have with your health care Molly Archer.

## 2014-01-20 ENCOUNTER — Telehealth: Payer: Self-pay | Admitting: *Deleted

## 2014-01-20 NOTE — Telephone Encounter (Signed)
Called pt and informed her that her endometrial biopsy results have been reviewed by Dr. Ihor Dow and the results are normal. She should follow up as previously discussed.  Pt voiced understanding.

## 2014-02-26 ENCOUNTER — Telehealth: Payer: Self-pay | Admitting: *Deleted

## 2014-02-26 DIAGNOSIS — N938 Other specified abnormal uterine and vaginal bleeding: Secondary | ICD-10-CM

## 2014-02-26 MED ORDER — MEGESTROL ACETATE 40 MG PO TABS: 40.0000 mg | ORAL_TABLET | Freq: Two times a day (BID) | ORAL | Status: DC

## 2014-02-26 NOTE — Telephone Encounter (Signed)
Pt called nurse line requesting a refill on a prescription medication, megace.  Contacted patient, no answer, left message that prescription Megace was refilled.

## 2014-03-23 ENCOUNTER — Encounter: Payer: Self-pay | Admitting: General Practice

## 2014-03-24 ENCOUNTER — Ambulatory Visit: Payer: BC Managed Care – PPO | Admitting: Obstetrics & Gynecology

## 2014-06-28 ENCOUNTER — Ambulatory Visit (INDEPENDENT_AMBULATORY_CARE_PROVIDER_SITE_OTHER): Payer: BC Managed Care – PPO | Admitting: Family Medicine

## 2014-06-28 ENCOUNTER — Encounter: Payer: Self-pay | Admitting: Family Medicine

## 2014-06-28 VITALS — BP 140/96 | HR 90 | Temp 98.6°F | Ht 65.0 in | Wt 325.1 lb

## 2014-06-28 DIAGNOSIS — N938 Other specified abnormal uterine and vaginal bleeding: Secondary | ICD-10-CM | POA: Diagnosis not present

## 2014-06-28 DIAGNOSIS — Q828 Other specified congenital malformations of skin: Secondary | ICD-10-CM | POA: Diagnosis not present

## 2014-06-28 MED ORDER — MEGESTROL ACETATE 40 MG PO TABS
40.0000 mg | ORAL_TABLET | Freq: Two times a day (BID) | ORAL | Status: DC
Start: 2014-06-28 — End: 2014-09-11

## 2014-06-28 MED ORDER — IBUPROFEN 800 MG PO TABS: 800.0000 mg | ORAL_TABLET | Freq: Three times a day (TID) | ORAL | Status: DC | PRN

## 2014-06-28 NOTE — Progress Notes (Signed)
   Subjective:    Patient ID: Molly Archer, female    DOB: Jan 17, 1981, 33 y.o.   MRN: [id number]  HPI Patient seen for "bumps on her vagina" that she noticed about 2-3 weeks ago.  The raised lesions are nontender, small, without drainage.  The lesions have not changed since she noticed them.   Review of Systems  Constitutional: Negative for fever and chills.  Genitourinary: Negative for dysuria, decreased urine volume, vaginal bleeding, vaginal discharge, genital sores and pelvic pain.       Objective:   Physical Exam  Constitutional: She is oriented to person, place, and time. She appears well-developed and well-nourished.  Genitourinary:    No labial fusion. There is no rash, tenderness, lesion or injury on the right labia. There is no rash, tenderness, lesion or injury on the left labia.  Neurological: She is alert and oriented to person, place, and time.  Skin: Skin is warm and dry.  Psychiatric: She has a normal mood and affect. Her behavior is normal. Judgment and thought content normal.      Assessment & Plan:   Problem List Items Addressed This Visit    None    Visit Diagnoses    Accessory skin tags    -  Primary    DUB (dysfunctional uterine bleeding)        Relevant Medications       megestrol (MEGACE) tablet      No lesions seen.  She does have several small normal skin tags that are noninflammed.  Discussed normal labial tissue.  F/u prn.  Continue megace - refilled.

## 2014-08-06 ENCOUNTER — Inpatient Hospital Stay (HOSPITAL_COMMUNITY)
Admission: AD | Admit: 2014-08-06 | Discharge: 2014-08-06 | Disposition: A | Discharge status: Home / Self Care | Payer: BLUE CROSS/BLUE SHIELD | Source: Ambulatory Visit | Attending: Obstetrics and Gynecology | Admitting: Obstetrics and Gynecology

## 2014-08-06 ENCOUNTER — Encounter (HOSPITAL_COMMUNITY): Payer: Self-pay | Admitting: *Deleted

## 2014-08-06 DIAGNOSIS — I10 Essential (primary) hypertension: Secondary | ICD-10-CM | POA: Insufficient documentation

## 2014-08-06 DIAGNOSIS — R109 Unspecified abdominal pain: Secondary | ICD-10-CM | POA: Insufficient documentation

## 2014-08-06 DIAGNOSIS — N939 Abnormal uterine and vaginal bleeding, unspecified: Secondary | ICD-10-CM

## 2014-08-06 DIAGNOSIS — R938 Abnormal findings on diagnostic imaging of other specified body structures: Secondary | ICD-10-CM | POA: Insufficient documentation

## 2014-08-06 LAB — URINE MICROSCOPIC-ADD ON

## 2014-08-06 LAB — URINALYSIS, ROUTINE W REFLEX MICROSCOPIC
Bilirubin Urine: NEGATIVE
Glucose, UA: NEGATIVE mg/dL
KETONES UR: NEGATIVE mg/dL
Leukocytes, UA: NEGATIVE
Nitrite: NEGATIVE
PH: 6 (ref 5.0–8.0)
Protein, ur: NEGATIVE mg/dL
Specific Gravity, Urine: 1.025 (ref 1.005–1.030)
Urobilinogen, UA: 0.2 mg/dL (ref 0.0–1.0)

## 2014-08-06 LAB — POCT PREGNANCY, URINE: Preg Test, Ur: NEGATIVE

## 2014-08-06 LAB — CBC
HCT: 34.1 % — ABNORMAL LOW (ref 36.0–46.0)
Hemoglobin: 10.7 g/dL — ABNORMAL LOW (ref 12.0–15.0)
MCH: 26 pg (ref 26.0–34.0)
MCHC: 31.4 g/dL (ref 30.0–36.0)
MCV: 82.8 fL (ref 78.0–100.0)
PLATELETS: 256 10*3/uL (ref 150–400)
RBC: 4.12 MIL/uL (ref 3.87–5.11)
RDW: 14.2 % (ref 11.5–15.5)
WBC: 7.5 10*3/uL (ref 4.0–10.5)

## 2014-08-06 MED ORDER — OXYCODONE-ACETAMINOPHEN 5-325 MG PO TABS
1.0000 | ORAL_TABLET | ORAL | Status: DC | PRN
Start: 2014-08-06 — End: 2014-09-11

## 2014-08-06 MED ORDER — MEDROXYPROGESTERONE ACETATE 10 MG PO TABS: ORAL_TABLET | ORAL | Status: DC

## 2014-08-06 NOTE — MAU Note (Signed)
Pt presents to MAU with complaints of irregular vaginal bleeding, abdominal pressure, frequent urination and constipation.

## 2014-08-06 NOTE — Discharge Instructions (Signed)
Abnormal Uterine Bleeding Abnormal uterine bleeding can affect women at various stages in life, including teenagers, women in their reproductive years, pregnant women, and women who have reached menopause. Several kinds of uterine bleeding are considered abnormal, including:  Bleeding or spotting between periods.   Bleeding after sexual intercourse.   Bleeding that is heavier or more than normal.   Periods that last longer than usual.  Bleeding after menopause.  Many cases of abnormal uterine bleeding are minor and simple to treat, while others are more serious. Any type of abnormal bleeding should be evaluated by your health care provider. Treatment will depend on the cause of the bleeding. HOME CARE INSTRUCTIONS Monitor your condition for any changes. The following actions may help to alleviate any discomfort you are experiencing:  Avoid the use of tampons and douches as directed by your health care provider.  Change your pads frequently. You should get regular pelvic exams and Pap tests. Keep all follow-up appointments for diagnostic tests as directed by your health care provider.  SEEK MEDICAL CARE IF:   Your bleeding lasts more than 1 week.   You feel dizzy at times.  SEEK IMMEDIATE MEDICAL CARE IF:   You pass out.   You are changing pads every 15 to 30 minutes.   You have abdominal pain.  You have a fever.   You become sweaty or weak.   You are passing large blood clots from the vagina.   You start to feel nauseous and vomit. MAKE SURE YOU:   Understand these instructions.  Will watch your condition.  Will get help right away if you are not doing well or get worse. Document Released: 07/16/2005 Document Revised: 07/21/2013 Document Reviewed: 02/12/2013 [employer] Patient Information 2015 [employer]. This information is not intended to replace advice given to you by your health care provider. Make sure you discuss any questions you have with your  health care provider.  

## 2014-08-06 NOTE — MAU Provider Note (Signed)
CC: Vaginal Bleeding; Abdominal Cramping; Urinary Frequency; and Constipation    First Provider Initiated Contact with Patient 08/06/14 1835      HPI Molly Archer is a 34 y.o. G0 who presents with onset of BRB progressively heavier and with small clots for 2 days. Bleeding associated with very painful lower abdominal cramping and fatigue, but no orthostatic sx. Longstanding hx of irregular bleeding with D&Cs (see below). States since menarche would have about 1 heavy bleed/yr. Last EB 01/14/14 significant for benign polyp. She has been on Megace 40mg  bid since then and not bleeding until this episode. She did not take the Megace for 1-1/2 weeks, then went back on it starting  the last week, last dose last night.  Has not been taking her antihypertensives. Not sexually active x 3 years. Had complete physical exam at [facility] 3 months ago. Student at [employer].  Past Medical History  Diagnosis Date  . Asthma   . Hypertension   . Hx of Bell's palsy June 2013  . Psoriasis   . Menometrorrhagia     OB History  Gravida Para Term Preterm AB SAB TAB Ectopic Multiple Living  0 0 0 0 0 0 0 0 0 0         Past Surgical History  Procedure Laterality Date  . Combined hysteroscopy diagnostic / d&c N/A 2011    Benign secretory endometrium  . Dilation and curettage of uterus  10/2003    Polyp, mild atypia, simple and complex hyperplasia,  . Dilation and curettage of uterus  11/2003    Simple and complex hyperplasia    History   Social History  . Marital Status: Single    Spouse Name: N/A    Number of Children: N/A  . Years of Education: N/A   Occupational History  . Not on file.   Social History Main Topics  . Smoking status: Never Smoker   . Smokeless tobacco: Never Used  . Alcohol Use: No  . Drug Use: No  . Sexual Activity: Not Currently    Birth Control/ Protection: None   Other Topics Concern  . Not on file   Social History Narrative    No current facility-administered  medications on file prior to encounter.   Current Outpatient Prescriptions on File Prior to Encounter  Medication Sig Dispense Refill  . albuterol (PROVENTIL HFA;VENTOLIN HFA) 108 (90 BASE) MCG/ACT inhaler Inhale 2 puffs into the lungs every 4 (four) hours as needed for wheezing or shortness of breath. For cough/wheeze.    Marland Kitchen amLODipine (NORVASC) 10 MG tablet Take 10 mg by mouth every evening.     Marland Kitchen ibuprofen (ADVIL,MOTRIN) 800 MG tablet Take 1 tablet (800 mg total) by mouth every 8 (eight) hours as needed. (Patient taking differently: Take 800 mg by mouth every 8 (eight) hours as needed for headache or cramping. ) 60 tablet 1  . losartan (COZAAR) 25 MG tablet Take 25 mg by mouth daily.    . megestrol (MEGACE) 40 MG tablet Take 1 tablet (40 mg total) by mouth 2 (two) times daily. (Patient taking differently: Take 80 mg by mouth daily. ) 30 tablet 3  . polyethylene glycol (MIRALAX / GLYCOLAX) packet Take 17 g by mouth daily. (Patient not taking: Reported on 08/06/2014) 14 each 1    Allergies  Allergen Reactions  . Codeine Itching  . Peanut-Containing Drug Products Itching  . Penicillins Hives  . Shellfish Allergy Itching    ROS Pertinent items in HPI  PHYSICAL EXAM  Filed Vitals:   08/06/14 1632 08/06/14 1739  BP: 198/111 157/96  Pulse: 80 106  Temp:  99 F (37.2 C)  TempSrc:  Oral  Resp:  18  Height: 5\' 5"  (1.651 m)   Weight: 153.316 kg (338 lb)    General: Morbidly obese female in no acute distress Cardiovascular: Normal rate Respiratory: Normal effort Abdomen: Soft, nontender Back: No CVAT Extremities: No edema Neurologic: Alert and oriented Speculum exam: NEFG; vagina with moderate blood; cervix with Nabothian cyst ant lip Bimanual exam: cervix closed, no CMT; uterus and adnexae unable to outline due to body habitus.  LAB RESULTS Results for orders placed or performed during the hospital encounter of 08/06/14 (from the past 24 hour(s))  Urinalysis, Routine w reflex  microscopic     Status: Abnormal   Collection Time: 08/06/14  4:30 PM  Result Value Ref Range   Color, Urine YELLOW YELLOW   APPearance HAZY (A) CLEAR   Specific Gravity, Urine 1.025 1.005 - 1.030   pH 6.0 5.0 - 8.0   Glucose, UA NEGATIVE NEGATIVE mg/dL   Hgb urine dipstick LARGE (A) NEGATIVE   Bilirubin Urine NEGATIVE NEGATIVE   Ketones, ur NEGATIVE NEGATIVE mg/dL   Protein, ur NEGATIVE NEGATIVE mg/dL   Urobilinogen, UA 0.2 0.0 - 1.0 mg/dL   Nitrite NEGATIVE NEGATIVE   Leukocytes, UA NEGATIVE NEGATIVE  Urine microscopic-add on     Status: Abnormal   Collection Time: 08/06/14  4:30 PM  Result Value Ref Range   Squamous Epithelial / LPF FEW (A) RARE   WBC, UA 0-2 <3 WBC/hpf   RBC / HPF 21-50 <3 RBC/hpf  Pregnancy, urine POC     Status: None   Collection Time: 08/06/14  4:43 PM  Result Value Ref Range   Preg Test, Ur NEGATIVE NEGATIVE  CBC     Status: Abnormal   Collection Time: 08/06/14  5:55 PM  Result Value Ref Range   WBC 7.5 4.0 - 10.5 K/uL   RBC 4.12 3.87 - 5.11 MIL/uL   Hemoglobin 10.7 (L) 12.0 - 15.0 g/dL   HCT 34.1 (L) 36.0 - 46.0 %   MCV 82.8 78.0 - 100.0 fL   MCH 26.0 26.0 - 34.0 pg   MCHC 31.4 30.0 - 36.0 g/dL   RDW 14.2 11.5 - 15.5 %   Platelets 256 150 - 400 K/uL    IMAGING Last [place] 12/11/13 showed thickened endometrium 50mm  MAU COURSE Consulted Dr. Glo Herring for plan of care below  ASSESSMENT  No diagnosis found.  PLAN Discharge home. See AVS for patient education. Continue the Megace 40mg  bid until bleeding stops, then discontinue and start Provera as directed. After 3 months F/U US and have GYN visit   Medication List    STOP taking these medications        hydrocortisone cream 1 %      TAKE these medications        albuterol 108 (90 BASE) MCG/ACT inhaler  Commonly known as:  PROVENTIL HFA;VENTOLIN HFA  Inhale 2 puffs into the lungs every 4 (four) hours as needed for wheezing or shortness of breath. For cough/wheeze.     amLODipine 10 MG  tablet  Commonly known as:  NORVASC  Take 10 mg by mouth every evening.     ibuprofen 800 MG tablet  Commonly known as:  ADVIL,MOTRIN  Take 1 tablet (800 mg total) by mouth every 8 (eight) hours as needed.     losartan 25 MG tablet  Commonly known  as:  COZAAR  Take 25 mg by mouth daily.     medroxyPROGESTERone 10 MG tablet  Commonly known as:  PROVERA  Take 1 tab po qd x 14days, then off x14days; Repeat x 3 months     megestrol 40 MG tablet  Commonly known as:  MEGACE  Take 1 tablet (40 mg total) by mouth 2 (two) times daily.     oxyCODONE-acetaminophen 5-325 MG per tablet  Commonly known as:  PERCOCET/ROXICET  Take 1 tablet by mouth every 4 (four) hours as needed.     polyethylene glycol packet  Commonly known as:  MIRALAX / GLYCOLAX  Take 17 g by mouth daily.       Follow-up Information    Follow up with [place].   Why:  Someone from Clinic will call you with appt.   Contact information:   [place] [address] [phone]      F/U with PCP re Twana First, CNM 08/06/2014 6:36 PM

## 2014-08-09 ENCOUNTER — Telehealth: Payer: Self-pay | Admitting: General Practice

## 2014-08-09 DIAGNOSIS — N939 Abnormal uterine and vaginal bleeding, unspecified: Secondary | ICD-10-CM

## 2014-08-09 NOTE — Telephone Encounter (Signed)
-----   Message from Lorene Dy, CNM sent at 08/06/2014  7:08 PM EST ----- AUB. RWas given rx Provera course x 3 months and then needs pelvic US and MD appointment

## 2014-08-09 NOTE — Telephone Encounter (Signed)
Scheduled ultrasound for 4/11. Called patient to inform her of appt. Patient states that she doesn't want to wait that long to be seen and the problem is that when she gets her periods they are extremely painful. She feels like she has had this plan of care before and that at one point they were going to do a d/c.  Patient states the percocet helps but she can only take it at night because it makes her too sleepy to take at work and the motrin 800mg  doesn't help. Told patient I will cancel the ultrasound appt and get the front office staff to contact her with a sooner appt if that's what she would prefer. Patient verbalized understanding. Told patient the front office staff will contact her with an appt. Patient verbalized understanding and had no other questions

## 2014-08-12 ENCOUNTER — Telehealth: Payer: Self-pay | Admitting: *Deleted

## 2014-08-12 NOTE — Telephone Encounter (Signed)
Molly Archer called and left a message stating she was seen in MAU last week for abnormal bleeding and pain.  States she was calling because the bleeding has stopped but is urinating a lot more often than normal , leaking urine, and having a lot of pressure .    Called [name] and we discussed that she was having this pressure and urination when she was seen in MAU last week and it hasn't changed- she states bleeding better but pressure same.  We discussed she had a urinalysis done last week that was negative for uti. She denies pain with urination- just same pressure and voiding often large amounts. Denies starting any new meds including diuretics.  I encouraged her to call her primary care doctor for appointment as it may not be ob/gyn related.

## 2014-09-05 ENCOUNTER — Telehealth: Payer: Self-pay | Admitting: Family

## 2014-09-05 DIAGNOSIS — K219 Gastro-esophageal reflux disease without esophagitis: Secondary | ICD-10-CM

## 2014-09-05 MED ORDER — BENZONATATE 100 MG PO CAPS: 100.0000 mg | ORAL_CAPSULE | Freq: Three times a day (TID) | ORAL | Status: DC | PRN

## 2014-09-05 NOTE — Progress Notes (Signed)
We are sorry that you are not feeling well.  Here is how we plan to help!  Based on what you have shared with me it looks like you have upper respiratory tract inflammation that has resulted in a significant cough.  Inflammation and infection in the upper respiratory tract is commonly called bronchitis and has four common causes:  Allergies, Viral Infections, Acid Reflux and Bacterial Infections.  Allergies, viruses and acid reflux are treated by controlling symptoms or eliminating the cause. An example might be a cough caused by taking certain blood pressure medications. You stop the cough by changing the medication. Another example might be a cough caused by acid reflux. Controlling the reflux helps control the cough.  Based on your presentation I believe you most likely have A cough due to reflux. To lessen this cough you can use the over-the counter cough medication Delsym but it is very important that we control the cause of the cough.  I suggest that you begin Prilosec 20 mg twice a day for 2 weeks.  You should see the cough lessen quickly as your reflux ( which may be silent or without burning ) is treated.   In addition you may use A non-prescription cough medication called Robitussin DAC. Take 2 teaspoons every 8 hours or Delsym: take 2 teaspoons every 12 hours. and A prescription cough medication called Tessalon Perles 100mg . You may take 1-2 capsules every 8 hours as needed for your cough.    HOME CARE . Only take medications as instructed by your medical team. . Complete the entire course of an antibiotic. . Drink plenty of fluids and get plenty of rest. . Avoid close contacts especially the very young and the elderly . Cover your mouth if you cough or cough into your sleeve. . Always remember to wash your hands . A steam or ultrasonic humidifier can help congestion.    GET HELP RIGHT AWAY IF: . You develop worsening fever. . You become short of breath . You cough up blood. . Your  symptoms persist after you have completed your treatment plan MAKE SURE YOU   Understand these instructions.  Will watch your condition.  Will get help right away if you are not doing well or get worse.  Your e-visit answers were reviewed by a board certified advanced clinical practitioner to complete your personal care plan.  Depending on the condition, your plan could have included both over the counter or prescription medications.  If there is a problem please reply  once you have received a response from your provider.  Your safety is important to [place].  If you have drug allergies check your prescription carefully.    You can use MyChart to ask questions about today's visit, request a non-urgent call back, or ask for a work or school excuse.  You will get an e-mail in the next two days asking about your experience.  I hope that your e-visit has been valuable and will speed your recovery. Thank you for using e-visits.

## 2014-09-11 ENCOUNTER — Inpatient Hospital Stay (HOSPITAL_COMMUNITY)
Admission: AD | Admit: 2014-09-11 | Discharge: 2014-09-11 | Disposition: A | Discharge status: Home / Self Care | Payer: BLUE CROSS/BLUE SHIELD | Source: Ambulatory Visit | Attending: Obstetrics & Gynecology | Admitting: Obstetrics & Gynecology

## 2014-09-11 ENCOUNTER — Encounter (HOSPITAL_COMMUNITY): Payer: Self-pay

## 2014-09-11 ENCOUNTER — Inpatient Hospital Stay (HOSPITAL_COMMUNITY): Payer: BLUE CROSS/BLUE SHIELD

## 2014-09-11 DIAGNOSIS — N920 Excessive and frequent menstruation with regular cycle: Secondary | ICD-10-CM | POA: Diagnosis present

## 2014-09-11 DIAGNOSIS — N921 Excessive and frequent menstruation with irregular cycle: Secondary | ICD-10-CM

## 2014-09-11 DIAGNOSIS — D649 Anemia, unspecified: Secondary | ICD-10-CM | POA: Insufficient documentation

## 2014-09-11 LAB — URINE MICROSCOPIC-ADD ON

## 2014-09-11 LAB — URINALYSIS, ROUTINE W REFLEX MICROSCOPIC
Bilirubin Urine: NEGATIVE
Glucose, UA: NEGATIVE mg/dL
Ketones, ur: NEGATIVE mg/dL
Leukocytes, UA: NEGATIVE
Nitrite: NEGATIVE
PROTEIN: 30 mg/dL — AB
SPECIFIC GRAVITY, URINE: 1.02 (ref 1.005–1.030)
Urobilinogen, UA: 0.2 mg/dL (ref 0.0–1.0)
pH: 5.5 (ref 5.0–8.0)

## 2014-09-11 LAB — CBC
HCT: 30.3 % — ABNORMAL LOW (ref 36.0–46.0)
Hemoglobin: 9.7 g/dL — ABNORMAL LOW (ref 12.0–15.0)
MCH: 26.3 pg (ref 26.0–34.0)
MCHC: 32 g/dL (ref 30.0–36.0)
MCV: 82.1 fL (ref 78.0–100.0)
Platelets: 266 10*3/uL (ref 150–400)
RBC: 3.69 MIL/uL — ABNORMAL LOW (ref 3.87–5.11)
RDW: 14.3 % (ref 11.5–15.5)
WBC: 7.6 10*3/uL (ref 4.0–10.5)

## 2014-09-11 LAB — POCT PREGNANCY, URINE: Preg Test, Ur: NEGATIVE

## 2014-09-11 MED ORDER — MEDROXYPROGESTERONE ACETATE 10 MG PO TABS: 20.0000 mg | ORAL_TABLET | Freq: Every day | ORAL | Status: DC

## 2014-09-11 MED ORDER — OXYCODONE-ACETAMINOPHEN 5-325 MG PO TABS: 1.0000 | ORAL_TABLET | Freq: Four times a day (QID) | ORAL | Status: DC | PRN

## 2014-09-11 MED ORDER — HYDROMORPHONE HCL 1 MG/ML IJ SOLN
1.0000 mg | Freq: Once | INTRAMUSCULAR | Status: AC
  Administered 2014-09-11: 1 mg via INTRAVENOUS
  Filled 2014-09-11: qty 1

## 2014-09-11 MED ORDER — KETOROLAC TROMETHAMINE 60 MG/2ML IM SOLN
60.0000 mg | Freq: Once | INTRAMUSCULAR | Status: AC
  Administered 2014-09-11: 60 mg via INTRAMUSCULAR
  Filled 2014-09-11: qty 2

## 2014-09-11 NOTE — MAU Note (Signed)
Pt presents complaining of vaginal bleeding. Has had ongoing problems with heavy bleeding. States she has just stopped cycle of provera.

## 2014-09-11 NOTE — MAU Provider Note (Signed)
History     CSN: [id number]  Arrival date and time: 09/11/14 1433   None     Chief Complaint  Patient presents with  . Vaginal Bleeding   HPI This is a 34 y.o. who presents with c/o heavy bleeding. Just stopped Provera (on 2 wks off 2 wks). States feeling weak and passing large clots. Scheduled to see MD in clinic end of Feb.  States she has been on Megace for a long time and was just switched to Provera in December "to get my uterus cleaned out".  Wants to conceive "whenever I find a man", so has not discussed ablation. Was on Metformin years ago. States they have mentioned PCOS in distant past. States periods have never been normal.   Has had D&C x 4  RN Note:  Expand All Collapse All   Pt presents complaining of vaginal bleeding. Has had ongoing problems with heavy bleeding. States she has just stopped cycle of provera.         OB History    Gravida Para Term Preterm AB TAB SAB Ectopic Multiple Living   0 0 0 0 0 0 0 0 0 0       Past Medical History  Diagnosis Date  . Asthma   . Hypertension   . Hx of Bell's palsy June 2013  . Psoriasis   . Menometrorrhagia     Past Surgical History  Procedure Laterality Date  . Combined hysteroscopy diagnostic / d&c N/A 2011    Benign secretory endometrium  . Dilation and curettage of uterus  10/2003    Polyp, mild atypia, simple and complex hyperplasia,  . Dilation and curettage of uterus  11/2003    Simple and complex hyperplasia    Family History  Problem Relation Age of Onset  . Hypertension Other   . Diabetes Other   . Cancer Other     History  Substance Use Topics  . Smoking status: Never Smoker   . Smokeless tobacco: Never Used  . Alcohol Use: No    Allergies:  Allergies  Allergen Reactions  . Codeine Itching  . Peanut-Containing Drug Products Itching  . Penicillins Hives  . Shellfish Allergy Itching    Prescriptions prior to admission  Medication Sig Dispense Refill Last Dose  . albuterol  (PROVENTIL HFA;VENTOLIN HFA) 108 (90 BASE) MCG/ACT inhaler Inhale 2 puffs into the lungs every 4 (four) hours as needed for wheezing or shortness of breath. For cough/wheeze.   09/10/2014 at Unknown time  . amLODipine (NORVASC) 10 MG tablet Take 10 mg by mouth every evening.    Past Week at Unknown time  . benzonatate (TESSALON PERLES) 100 MG capsule Take 1-2 capsules (100-200 mg total) by mouth every 8 (eight) hours as needed for cough. 30 capsule 0 09/11/2014 at Unknown time  . ibuprofen (ADVIL,MOTRIN) 800 MG tablet Take 1 tablet (800 mg total) by mouth every 8 (eight) hours as needed. (Patient taking differently: Take 800 mg by mouth every 8 (eight) hours as needed for headache or cramping. ) 60 tablet 1 09/10/2014 at Unknown time  . losartan (COZAAR) 25 MG tablet Take 25 mg by mouth daily.   Past Week at Unknown time  . megestrol (MEGACE) 40 MG tablet Take 1 tablet (40 mg total) by mouth 2 (two) times daily. (Patient taking differently: Take 80 mg by mouth daily. ) 30 tablet 3 08/20/2014 at Unknown time  . oxyCODONE-acetaminophen (PERCOCET/ROXICET) 5-325 MG per tablet Take 1 tablet by mouth every 4 (four)  hours as needed. (Patient taking differently: Take 1 tablet by mouth every 4 (four) hours as needed for moderate pain or severe pain. ) 30 tablet 0 09/11/2014 at Unknown time  . polyethylene glycol (MIRALAX / GLYCOLAX) packet Take 17 g by mouth daily. 14 each 1 09/10/2014 at Unknown time  . medroxyPROGESTERone (PROVERA) 10 MG tablet Take 1 tab po qd x 14days, then off x14days; Repeat x 3 months 42 tablet 0 08/31/2014    Review of Systems  Constitutional: Negative for fever, chills and malaise/fatigue.  Gastrointestinal: Positive for abdominal pain (cramping). Negative for nausea, vomiting, diarrhea and constipation.  Musculoskeletal: Negative for myalgias.  Neurological: Positive for dizziness and weakness.   Physical Exam   Blood pressure 162/89, pulse 76, temperature 98 F (36.7 C), temperature  source Oral, resp. rate 22, last menstrual period 09/07/2014.  Physical Exam  Constitutional: She is oriented to person, place, and time. She appears well-developed and well-nourished.  HENT:  Head: Normocephalic.  Cardiovascular: Normal rate.   Respiratory: Effort normal.  GI: Soft. She exhibits no distension. There is no tenderness. There is no rebound and no guarding.  Genitourinary: Vaginal discharge (slow trickle of blood from os) found.  Unable to palpate uterus or adnexae due to habitus   Musculoskeletal: Normal range of motion.  Neurological: She is alert and oriented to person, place, and time.  Skin: Skin is warm and dry.  Psychiatric: She has a normal mood and affect.    MAU Course  Procedures  MDM Results for orders placed or performed during the hospital encounter of 09/11/14 (from the past 24 hour(s))  Urinalysis, Routine w reflex microscopic     Status: Abnormal   Collection Time: 09/11/14  2:45 PM  Result Value Ref Range   Color, Urine RED (A) YELLOW   APPearance TURBID (A) CLEAR   Specific Gravity, Urine 1.020 1.005 - 1.030   pH 5.5 5.0 - 8.0   Glucose, UA NEGATIVE NEGATIVE mg/dL   Hgb urine dipstick LARGE (A) NEGATIVE   Bilirubin Urine NEGATIVE NEGATIVE   Ketones, ur NEGATIVE NEGATIVE mg/dL   Protein, ur 30 (A) NEGATIVE mg/dL   Urobilinogen, UA 0.2 0.0 - 1.0 mg/dL   Nitrite NEGATIVE NEGATIVE   Leukocytes, UA NEGATIVE NEGATIVE  Urine microscopic-add on     Status: Abnormal   Collection Time: 09/11/14  2:45 PM  Result Value Ref Range   Squamous Epithelial / LPF FEW (A) RARE   WBC, UA 0-2 <3 WBC/hpf   RBC / HPF TOO NUMEROUS TO COUNT <3 RBC/hpf   Bacteria, UA FEW (A) RARE  Pregnancy, urine POC     Status: None   Collection Time: 09/11/14  2:52 PM  Result Value Ref Range   Preg Test, Ur NEGATIVE NEGATIVE  CBC     Status: Abnormal   Collection Time: 09/11/14  2:57 PM  Result Value Ref Range   WBC 7.6 4.0 - 10.5 K/uL   RBC 3.69 (L) 3.87 - 5.11 MIL/uL    Hemoglobin 9.7 (L) 12.0 - 15.0 g/dL   HCT 30.3 (L) 36.0 - 46.0 %   MCV 82.1 78.0 - 100.0 fL   MCH 26.3 26.0 - 34.0 pg   MCHC 32.0 30.0 - 36.0 g/dL   RDW 14.3 11.5 - 15.5 %   Platelets 266 150 - 400 K/uL   Results for FLETCHER, RATHBUN (MRN [id number]) as of 09/11/2014 16:17  Ref. Range 08/06/2014 17:55  Hemoglobin Latest Range: 12.0-15.0 g/dL 10.7 (L)  (so Hgb has  dropped one point in one month)  Assessment and Plan  A;  Menorrhagia       Mild anemia  P:  Discussed with Dr Roselie Awkward       Will move to continuous (not cyclic) Provera at 20mg  tid x 1 week then 20mg  per day       Keep appointment for eval in clinic       Refilled Percocet #20.   Hansel Feinstein 09/11/2014, 3:13 PM

## 2014-09-11 NOTE — Discharge Instructions (Signed)
Abnormal Uterine Bleeding Abnormal uterine bleeding can affect women at various stages in life, including teenagers, women in their reproductive years, pregnant women, and women who have reached menopause. Several kinds of uterine bleeding are considered abnormal, including:  Bleeding or spotting between periods.   Bleeding after sexual intercourse.   Bleeding that is heavier or more than normal.   Periods that last longer than usual.  Bleeding after menopause.  Many cases of abnormal uterine bleeding are minor and simple to treat, while others are more serious. Any type of abnormal bleeding should be evaluated by your health care provider. Treatment will depend on the cause of the bleeding. HOME CARE INSTRUCTIONS Monitor your condition for any changes. The following actions may help to alleviate any discomfort you are experiencing:  Avoid the use of tampons and douches as directed by your health care provider.  Change your pads frequently. You should get regular pelvic exams and Pap tests. Keep all follow-up appointments for diagnostic tests as directed by your health care provider.  SEEK MEDICAL CARE IF:   Your bleeding lasts more than 1 week.   You feel dizzy at times.  SEEK IMMEDIATE MEDICAL CARE IF:   You pass out.   You are changing pads every 15 to 30 minutes.   You have abdominal pain.  You have a fever.   You become sweaty or weak.   You are passing large blood clots from the vagina.   You start to feel nauseous and vomit. MAKE SURE YOU:   Understand these instructions.  Will watch your condition.  Will get help right away if you are not doing well or get worse. Document Released: 07/16/2005 Document Revised: 07/21/2013 Document Reviewed: 02/12/2013 [employer] Patient Information 2015 [employer]. This information is not intended to replace advice given to you by your health care provider. Make sure you discuss any questions you have with your  health care provider.  

## 2014-09-13 ENCOUNTER — Inpatient Hospital Stay (HOSPITAL_COMMUNITY)
Admission: AD | Admit: 2014-09-13 | Discharge: 2014-09-14 | Disposition: A | Discharge status: Home / Self Care | Payer: BLUE CROSS/BLUE SHIELD | Source: Ambulatory Visit | Attending: Obstetrics & Gynecology | Admitting: Obstetrics & Gynecology

## 2014-09-13 ENCOUNTER — Encounter (HOSPITAL_COMMUNITY): Payer: Self-pay | Admitting: *Deleted

## 2014-09-13 DIAGNOSIS — Z6841 Body Mass Index (BMI) 40.0 and over, adult: Secondary | ICD-10-CM | POA: Insufficient documentation

## 2014-09-13 DIAGNOSIS — N938 Other specified abnormal uterine and vaginal bleeding: Secondary | ICD-10-CM | POA: Diagnosis not present

## 2014-09-13 DIAGNOSIS — N939 Abnormal uterine and vaginal bleeding, unspecified: Secondary | ICD-10-CM | POA: Diagnosis present

## 2014-09-13 DIAGNOSIS — D62 Acute posthemorrhagic anemia: Secondary | ICD-10-CM | POA: Diagnosis not present

## 2014-09-13 LAB — CBC WITH DIFFERENTIAL/PLATELET
BASOS PCT: 1 % (ref 0–1)
Basophils Absolute: 0.1 10*3/uL (ref 0.0–0.1)
EOS ABS: 0.4 10*3/uL (ref 0.0–0.7)
Eosinophils Relative: 4 % (ref 0–5)
HEMATOCRIT: 26.7 % — AB (ref 36.0–46.0)
HEMOGLOBIN: 8.3 g/dL — AB (ref 12.0–15.0)
LYMPHS PCT: 24 % (ref 12–46)
Lymphs Abs: 2.3 10*3/uL (ref 0.7–4.0)
MCH: 25.8 pg — ABNORMAL LOW (ref 26.0–34.0)
MCHC: 31.1 g/dL (ref 30.0–36.0)
MCV: 82.9 fL (ref 78.0–100.0)
MONOS PCT: 8 % (ref 3–12)
Monocytes Absolute: 0.7 10*3/uL (ref 0.1–1.0)
NEUTROS ABS: 6.1 10*3/uL (ref 1.7–7.7)
NEUTROS PCT: 64 % (ref 43–77)
Platelets: 309 10*3/uL (ref 150–400)
RBC: 3.22 MIL/uL — ABNORMAL LOW (ref 3.87–5.11)
RDW: 14.8 % (ref 11.5–15.5)
WBC: 9.6 10*3/uL (ref 4.0–10.5)

## 2014-09-13 MED ORDER — KETOROLAC TROMETHAMINE 30 MG/ML IJ SOLN
30.0000 mg | Freq: Once | INTRAMUSCULAR | Status: AC
  Administered 2014-09-13: 30 mg via INTRAVENOUS
  Filled 2014-09-13: qty 1

## 2014-09-13 MED ORDER — AMLODIPINE BESYLATE 10 MG PO TABS
10.0000 mg | ORAL_TABLET | Freq: Once | ORAL | Status: AC
  Administered 2014-09-13: 10 mg via ORAL
  Filled 2014-09-13: qty 1

## 2014-09-13 MED ORDER — ESTROGENS CONJUGATED 25 MG IJ SOLR
25.0000 mg | Freq: Once | INTRAMUSCULAR | Status: AC
  Administered 2014-09-13: 25 mg via INTRAVENOUS
  Filled 2014-09-13: qty 25

## 2014-09-13 MED ORDER — OXYCODONE-ACETAMINOPHEN 5-325 MG PO TABS
1.0000 | ORAL_TABLET | Freq: Once | ORAL | Status: AC
  Administered 2014-09-13: 1 via ORAL
  Filled 2014-09-13: qty 1

## 2014-09-13 MED ORDER — LACTATED RINGERS IV BOLUS (SEPSIS)
1000.0000 mL | Freq: Once | INTRAVENOUS | Status: AC
  Administered 2014-09-13: 1000 mL via INTRAVENOUS

## 2014-09-13 NOTE — Discharge Instructions (Signed)
Abnormal Uterine Bleeding Abnormal uterine bleeding can affect women at various stages in life, including teenagers, women in their reproductive years, pregnant women, and women who have reached menopause. Several kinds of uterine bleeding are considered abnormal, including:  Bleeding or spotting between periods.   Bleeding after sexual intercourse.   Bleeding that is heavier or more than normal.   Periods that last longer than usual.  Bleeding after menopause.  Many cases of abnormal uterine bleeding are minor and simple to treat, while others are more serious. Any type of abnormal bleeding should be evaluated by your health care provider. Treatment will depend on the cause of the bleeding. HOME CARE INSTRUCTIONS Monitor your condition for any changes. The following actions may help to alleviate any discomfort you are experiencing:  Avoid the use of tampons and douches as directed by your health care provider.  Change your pads frequently. You should get regular pelvic exams and Pap tests. Keep all follow-up appointments for diagnostic tests as directed by your health care provider.  SEEK MEDICAL CARE IF:   Your bleeding lasts more than 1 week.   You feel dizzy at times.  SEEK IMMEDIATE MEDICAL CARE IF:   You pass out.   You are changing pads every 15 to 30 minutes.   You have abdominal pain.  You have a fever.   You become sweaty or weak.   You are passing large blood clots from the vagina.   You start to feel nauseous and vomit. MAKE SURE YOU:   Understand these instructions.  Will watch your condition.  Will get help right away if you are not doing well or get worse. Document Released: 07/16/2005 Document Revised: 07/21/2013 Document Reviewed: 02/12/2013 [employer] Patient Information 2015 [employer]. This information is not intended to replace advice given to you by your health care provider. Make sure you discuss any questions you have with your  health care provider.  

## 2014-09-13 NOTE — MAU Note (Signed)
PT  SAYS SHE WAS HERE ON SAT-  FOR  PAIN-    FOR BLEEDING   - HAS IRREG  CYCLE  AND  PUT ON PROVERA.      SAYS BLEEDING     IS  SAME  AS  SAT.     GOES  TO GYN  CLINIC.  IN  TRIAGE -  LARGE  AMT  BLEEDING -  NEW  PAD.  HAS  CRAMPS     GAVE PAIN  MED- PERCOCET -   8PM  1  TAB.

## 2014-09-13 NOTE — MAU Provider Note (Signed)
CC: No chief complaint on file.    First Provider Initiated Contact with Patient 09/13/14 2132      HPI Molly Archer is a 34 y.o. nulligravida who presents with continued heavy bleeding, using 4 pads/hour, passing clots and having severe cramping. She also reports orthostatic dizziness today. She took 1 Percocet at 4 AM and 1 Percocet 2 hours prior to arrival here. She has a long-standing history of irregular bleeding with multiple D&Cs. She reports that since menarche she did have about 1 heavy bleeding year. Most recent endometrial biopsy 01/14/2014 was significant for benign polyp since that date she been on Megace 40 twice a day but stopped the Megace for a week and a half and then presented here 08/06/2014 with bleeding and cramping. She was started on a regimen of Provera 10 mg a day for 2 weeks and then off for 2 weeks which was to be continued for 3 months.  for 3 months.  She presented here 2 days ago for heavy bleeding with clots and cramping after stopping the Provera since she had been on it for 2 weeks. Pelvic ultrasound showed thickened endometrium 27 mm, otherwise unremarkable. She was instructed to start Provera 20 mg tid for 1 week , then 20mg /d until her [facility] appointment 09/23/2014. Had a physical at [facility] recently but has not taken her Norvasc for several weeks because she's "not good at taking meds." Past Medical History  Diagnosis Date  . Asthma   . Hypertension   . Hx of Bell's palsy June 2013  . Psoriasis   . Menometrorrhagia     OB History  Gravida Para Term Preterm AB SAB TAB Ectopic Multiple Living  0 0 0 0 0 0 0 0 0 0         Past Surgical History  Procedure Laterality Date  . Combined hysteroscopy diagnostic / d&c N/A 2011    Benign secretory endometrium  . Dilation and curettage of uterus  10/2003    Polyp, mild atypia, simple and complex hyperplasia,  . Dilation and curettage of uterus  11/2003    Simple and complex hyperplasia    History    Social History  . Marital Status: Single    Spouse Name: N/A  . Number of Children: N/A  . Years of Education: N/A   Occupational History  . Not on file.   Social History Main Topics  . Smoking status: Never Smoker   . Smokeless tobacco: Never Used  . Alcohol Use: No  . Drug Use: No  . Sexual Activity: Not Currently    Birth Control/ Protection: None   Other Topics Concern  . Not on file   Social History Narrative    No current facility-administered medications on file prior to encounter.   Current Outpatient Prescriptions on File Prior to Encounter  Medication Sig Dispense Refill  . albuterol (PROVENTIL HFA;VENTOLIN HFA) 108 (90 BASE) MCG/ACT inhaler Inhale 2 puffs into the lungs every 4 (four) hours as needed for wheezing or shortness of breath. For cough/wheeze.    Marland Kitchen amLODipine (NORVASC) 10 MG tablet Take 10 mg by mouth every evening.     . benzonatate (TESSALON PERLES) 100 MG capsule Take 1-2 capsules (100-200 mg total) by mouth every 8 (eight) hours as needed for cough. 30 capsule 0  . ibuprofen (ADVIL,MOTRIN) 800 MG tablet Take 1 tablet (800 mg total) by mouth every 8 (eight) hours as needed. (Patient taking differently: Take 800 mg by mouth every 8 (eight) hours  as needed for headache or cramping. ) 60 tablet 1  . losartan (COZAAR) 25 MG tablet Take 25 mg by mouth daily.    . medroxyPROGESTERone (PROVERA) 10 MG tablet Take 2 tablets (20 mg total) by mouth daily. 2 tablets every 8 hours for one week. Then 2 tablets per day 30 tablet 2  . oxyCODONE-acetaminophen (PERCOCET/ROXICET) 5-325 MG per tablet Take 1-2 tablets by mouth every 6 (six) hours as needed. (Patient taking differently: Take 1-2 tablets by mouth every 6 (six) hours as needed for severe pain. ) 20 tablet 0  . polyethylene glycol (MIRALAX / GLYCOLAX) packet Take 17 g by mouth daily. 14 each 1    Allergies  Allergen Reactions  . Codeine Itching  . Peanut-Containing Drug Products Itching  . Penicillins  Hives  . Shellfish Allergy Itching     ROS Pertinent items in HPI   PHYSICAL EXAM Filed Vitals:   09/13/14 2241  BP:   Pulse:   Temp: 98.3 F (36.8 C)  Resp:     Orthostatic VS for the past 24 hrs:  BP- Lying Pulse- Lying BP- Sitting Pulse- Sitting  09/13/14 2155 - - 147/85 mmHg 88  09/13/14 2152 (!) 161/96 mmHg 83 - -   2158: BP standing 159/106, pulse standing 110    General: Morbidly obese female in apparent discomfort Cardiovascular: Normal rate Respiratory: Normal effort Abdomen: Soft, obese, mildly tender suprapubic region Back: No CVAT Extremities: 1+ edema Neurologic:grossly intact Speculum exam: NEFG; vagina with moderate blood, one small clot; cervix clean Bimanual exam: cervix closed, no CMT; Unable to outline uterus or adnexa due to body habitus  LAB RESULTS Results for orders placed or performed during the hospital encounter of 09/13/14 (from the past 24 hour(s))  CBC with Differential/Platelet     Status: Abnormal   Collection Time: 09/13/14  9:59 PM  Result Value Ref Range   WBC 9.6 4.0 - 10.5 K/uL   RBC 3.22 (L) 3.87 - 5.11 MIL/uL   Hemoglobin 8.3 (L) 12.0 - 15.0 g/dL   HCT 26.7 (L) 36.0 - 46.0 %   MCV 82.9 78.0 - 100.0 fL   MCH 25.8 (L) 26.0 - 34.0 pg   MCHC 31.1 30.0 - 36.0 g/dL   RDW 14.8 11.5 - 15.5 %   Platelets 309 150 - 400 K/uL   Neutrophils Relative % 64 43 - 77 %   Neutro Abs 6.1 1.7 - 7.7 K/uL   Lymphocytes Relative 24 12 - 46 %   Lymphs Abs 2.3 0.7 - 4.0 K/uL   Monocytes Relative 8 3 - 12 %   Monocytes Absolute 0.7 0.1 - 1.0 K/uL   Eosinophils Relative 4 0 - 5 %   Eosinophils Absolute 0.4 0.0 - 0.7 K/uL   Basophils Relative 1 0 - 1 %   Basophils Absolute 0.1 0.0 - 0.1 K/uL   CBC Latest Ref Rng 09/13/2014 09/11/2014 08/06/2014  WBC 4.0 - 10.5 K/uL 9.6 7.6 7.5  Hemoglobin 12.0 - 15.0 g/dL 8.3(L) 9.7(L) 10.7(L)  Hematocrit 36.0 - 46.0 % 26.7(L) 30.3(L) 34.1(L)  Platelets 150 - 400 K/uL 309 266 256             IMAGING US  Transvaginal Non-ob  09/11/2014   CLINICAL DATA:  Patient with pain and bleeding 09/07/2014.  EXAM: TRANSABDOMINAL AND TRANSVAGINAL ULTRASOUND OF PELVIS  TECHNIQUE: Both transabdominal and transvaginal ultrasound examinations of the pelvis were performed. Transabdominal technique was performed for global imaging of the pelvis including uterus, ovaries, adnexal regions,  and pelvic cul-de-sac. It was necessary to proceed with endovaginal exam following the transabdominal exam to visualize the endometrium.  COMPARISON:  Pelvic ultrasound 12/10/2013  FINDINGS: Uterus  Measurements: 10.3 x 6.0 x 5.8 cm. No fibroids or other mass visualized.  Endometrium  Thickness: 27 mm.  No focal abnormality visualized.  Right ovary  Measurements: 3.1 x 2.1 x 2.9 cm. Normal appearance/no adnexal mass.  Left ovary  Not well visualized.  Other findings  Small amount of free fluid in the pelvis.  IMPRESSION: The endometrium is markedly thickened and heterogeneous in echogenicity. Differential considerations include hyperplasia, neoplasm or polyps. If bleeding remains unresponsive to hormonal or medical therapy, focal lesion work-up with sonohysterogram should be considered. Endometrial biopsy should also be considered in pre-menopausal patients at high risk for endometrial carcinoma. (Ref: Radiological Reasoning: Algorithmic Workup of Abnormal Vaginal Bleeding with Endovaginal Sonography and Sonohysterography. AJR 2008; 979:Y80-16)   Electronically Signed   By: Lovey Newcomer M.D.   On: 09/11/2014 16:37   US Pelvis Complete  09/11/2014   CLINICAL DATA:  Patient with pain and bleeding 09/07/2014.  EXAM: TRANSABDOMINAL AND TRANSVAGINAL ULTRASOUND OF PELVIS  TECHNIQUE: Both transabdominal and transvaginal ultrasound examinations of the pelvis were performed. Transabdominal technique was performed for global imaging of the pelvis including uterus, ovaries, adnexal regions, and pelvic cul-de-sac. It was necessary to proceed with endovaginal  exam following the transabdominal exam to visualize the endometrium.  COMPARISON:  Pelvic ultrasound 12/10/2013  FINDINGS: Uterus  Measurements: 10.3 x 6.0 x 5.8 cm. No fibroids or other mass visualized.  Endometrium  Thickness: 27 mm.  No focal abnormality visualized.  Right ovary  Measurements: 3.1 x 2.1 x 2.9 cm. Normal appearance/no adnexal mass.  Left ovary  Not well visualized.  Other findings  Small amount of free fluid in the pelvis.  IMPRESSION: The endometrium is markedly thickened and heterogeneous in echogenicity. Differential considerations include hyperplasia, neoplasm or polyps. If bleeding remains unresponsive to hormonal or medical therapy, focal lesion work-up with sonohysterogram should be considered. Endometrial biopsy should also be considered in pre-menopausal patients at high risk for endometrial carcinoma. (Ref: Radiological Reasoning: Algorithmic Workup of Abnormal Vaginal Bleeding with Endovaginal Sonography and Sonohysterography. AJR 2008; 553:Z48-27)   Electronically Signed   By: Lovey Newcomer M.D.   On: 09/11/2014 16:37    MAU COURSE  Percocet 5/325 given Temp recheck 98.3 C/W Dr. Roselie Awkward at 2250> Premarin 25mg  IV, IV LR 1031ml, Toradol 30mg  IV Care assumed by Marcille Buffy CNM at 2310 0015: Patient is having a scant amount of bleeding currently, and reports that pain has improved.   ASSESSMENT  1. DUB (dysfunctional uterine bleeding)   2. Anemia associated with acute blood loss     PLAN Discharge home. See AVS for patient education.     Follow-up Information    Follow up with [facility] On 09/23/2014.   Specialty:  Obstetrics and Gynecology   Why:  Keep your scheduled appointment   Contact information:   [place] [phone]       Medication List    TAKE these medications        albuterol 108 (90 BASE) MCG/ACT inhaler  Commonly known as:  PROVENTIL HFA;VENTOLIN HFA  Inhale 2 puffs into the lungs  every 4 (four) hours as needed for wheezing or shortness of breath. For cough/wheeze.     amLODipine 10 MG tablet  Commonly known as:  NORVASC  Take 10 mg by mouth every  evening.     benzonatate 100 MG capsule  Commonly known as:  TESSALON PERLES  Take 1-2 capsules (100-200 mg total) by mouth every 8 (eight) hours as needed for cough.     ibuprofen 800 MG tablet  Commonly known as:  ADVIL,MOTRIN  Take 1 tablet (800 mg total) by mouth every 8 (eight) hours as needed.     losartan 25 MG tablet  Commonly known as:  COZAAR  Take 25 mg by mouth daily.     medroxyPROGESTERone 10 MG tablet  Commonly known as:  PROVERA  Take 2 tablets (20 mg total) by mouth daily. 2 tablets every 8 hours for one week. Then 2 tablets per day  Notes to Patient:  Resume this medication as you were instructed at your last visit here to [facility] hospital.      oxyCODONE-acetaminophen 5-325 MG per tablet  Commonly known as:  PERCOCET/ROXICET  Take 1-2 tablets by mouth every 6 (six) hours as needed.     polyethylene glycol packet  Commonly known as:  MIRALAX / GLYCOLAX  Take 17 g by mouth daily.       Follow-up Information    Follow up with [facility] On 09/23/2014.   Specialty:  Obstetrics and Gynecology   Why:  Keep your scheduled appointment   Contact information:   [address] [place] [phone]       Mathis Bud, [place] 09/14/2014 12:18 AM

## 2014-09-23 ENCOUNTER — Encounter: Payer: Self-pay | Admitting: Obstetrics and Gynecology

## 2014-09-23 ENCOUNTER — Ambulatory Visit (INDEPENDENT_AMBULATORY_CARE_PROVIDER_SITE_OTHER): Payer: Self-pay | Admitting: Obstetrics and Gynecology

## 2014-09-23 VITALS — BP 159/84 | HR 91 | Temp 98.9°F | Ht 65.0 in | Wt 333.1 lb

## 2014-09-23 DIAGNOSIS — R109 Unspecified abdominal pain: Secondary | ICD-10-CM

## 2014-09-23 DIAGNOSIS — N938 Other specified abnormal uterine and vaginal bleeding: Secondary | ICD-10-CM

## 2014-09-23 DIAGNOSIS — Z3202 Encounter for pregnancy test, result negative: Secondary | ICD-10-CM

## 2014-09-23 DIAGNOSIS — Z30430 Encounter for insertion of intrauterine contraceptive device: Secondary | ICD-10-CM

## 2014-09-23 LAB — POCT PREGNANCY, URINE: PREG TEST UR: NEGATIVE

## 2014-09-23 MED ORDER — LEVONORGESTREL 20 MCG/24HR IU IUD
INTRAUTERINE_SYSTEM | Freq: Once | INTRAUTERINE | Status: AC
  Administered 2014-09-23: 1 via INTRAUTERINE

## 2014-09-23 MED ORDER — IBUPROFEN 600 MG PO TABS
600.0000 mg | ORAL_TABLET | Freq: Once | ORAL | Status: AC
  Administered 2014-09-23: 600 mg via ORAL

## 2014-09-23 NOTE — Patient Instructions (Signed)
Levonorgestrel intrauterine device (IUD) What is this medicine? LEVONORGESTREL IUD (LEE voe nor jes trel) is a contraceptive (birth control) device. The device is placed inside the uterus by a healthcare professional. It is used to prevent pregnancy and can also be used to treat heavy bleeding that occurs during your period. Depending on the device, it can be used for 3 to 5 years. This medicine may be used for other purposes; ask your health care provider or pharmacist if you have questions. COMMON BRAND NAME(S): LILETTA, Mirena, Skyla What should I tell my health care provider before I take this medicine? They need to know if you have any of these conditions: -abnormal Pap smear -cancer of the breast, uterus, or cervix -diabetes -endometritis -genital or pelvic infection now or in the past -have more than one sexual partner or your partner has more than one partner -heart disease -history of an ectopic or tubal pregnancy -immune system problems -IUD in place -liver disease or tumor -problems with blood clots or take blood-thinners -use intravenous drugs -uterus of unusual shape -vaginal bleeding that has not been explained -an unusual or allergic reaction to levonorgestrel, other hormones, silicone, or polyethylene, medicines, foods, dyes, or preservatives -pregnant or trying to get pregnant -breast-feeding How should I use this medicine? This device is placed inside the uterus by a health care professional. Talk to your pediatrician regarding the use of this medicine in children. Special care may be needed. Overdosage: If you think you have taken too much of this medicine contact a poison control center or emergency room at once. NOTE: This medicine is only for you. Do not share this medicine with others. What if I miss a dose? This does not apply. What may interact with this medicine? Do not take this medicine with any of the following  medications: -amprenavir -bosentan -fosamprenavir This medicine may also interact with the following medications: -aprepitant -barbiturate medicines for inducing sleep or treating seizures -bexarotene -griseofulvin -medicines to treat seizures like carbamazepine, ethotoin, felbamate, oxcarbazepine, phenytoin, topiramate -modafinil -pioglitazone -rifabutin -rifampin -rifapentine -some medicines to treat HIV infection like atazanavir, indinavir, lopinavir, nelfinavir, tipranavir, ritonavir -St. John's wort -warfarin This list may not describe all possible interactions. Give your health care provider a list of all the medicines, herbs, non-prescription drugs, or dietary supplements you use. Also tell them if you smoke, drink alcohol, or use illegal drugs. Some items may interact with your medicine. What should I watch for while using this medicine? Visit your doctor or health care professional for regular check ups. See your doctor if you or your partner has sexual contact with others, becomes HIV positive, or gets a sexual transmitted disease. This product does not protect you against HIV infection (AIDS) or other sexually transmitted diseases. You can check the placement of the IUD yourself by reaching up to the top of your vagina with clean fingers to feel the threads. Do not pull on the threads. It is a good habit to check placement after each menstrual period. Call your doctor right away if you feel more of the IUD than just the threads or if you cannot feel the threads at all. The IUD may come out by itself. You may become pregnant if the device comes out. If you notice that the IUD has come out use a backup birth control method like condoms and call your health care provider. Using tampons will not change the position of the IUD and are okay to use during your period. What side effects may   I notice from receiving this medicine? Side effects that you should report to your doctor or  health care professional as soon as possible: -allergic reactions like skin rash, itching or hives, swelling of the face, lips, or tongue -fever, flu-like symptoms -genital sores -high blood pressure -no menstrual period for 6 weeks during use -pain, swelling, warmth in the leg -pelvic pain or tenderness -severe or sudden headache -signs of pregnancy -stomach cramping -sudden shortness of breath -trouble with balance, talking, or walking -unusual vaginal bleeding, discharge -yellowing of the eyes or skin Side effects that usually do not require medical attention (report to your doctor or health care professional if they continue or are bothersome): -acne -breast pain -change in sex drive or performance -changes in weight -cramping, dizziness, or faintness while the device is being inserted -headache -irregular menstrual bleeding within first 3 to 6 months of use -nausea This list may not describe all possible side effects. Call your doctor for medical advice about side effects. You may report side effects to FDA at 1-800-FDA-1088. Where should I keep my medicine? This does not apply. NOTE: This sheet is a summary. It may not cover all possible information. If you have questions about this medicine, talk to your doctor, pharmacist, or health care provider.  2015, Elsevier/Gold Standard. (2011-08-16 13:54:04)  

## 2014-09-23 NOTE — Progress Notes (Signed)
Patient ID: Molly Archer, female   DOB: 01/07/1981, 34 y.o.   MRN: [id number] 34 yo G0 presenting today as an ED follow up for the evaluation of DUB. Patient has a long stading history of DUB and has been medically treated with megace and provera. She was recently seen in the ED on 2/15 and treated with IV premarin with good results. Patient is ready for a new intervention. She desires to preserve her fertility at this time.  Past Medical History  Diagnosis Date  . Asthma   . Hypertension   . Hx of Bell's palsy June 2013  . Psoriasis   . Menometrorrhagia    Past Surgical History  Procedure Laterality Date  . Combined hysteroscopy diagnostic / d&c N/A 2011    Benign secretory endometrium  . Dilation and curettage of uterus  10/2003    Polyp, mild atypia, simple and complex hyperplasia,  . Dilation and curettage of uterus  11/2003    Simple and complex hyperplasia   Family History  Problem Relation Age of Onset  . Hypertension Other   . Diabetes Other   . Cancer Other    History  Substance Use Topics  . Smoking status: Never Smoker   . Smokeless tobacco: Never Used  . Alcohol Use: No    Physical exam GENERAL: Well-developed, well-nourished female in no acute distress. Obese ABDOMEN: Soft, nontender, nondistended. No organomegaly. PELVIC: Normal external female genitalia. Vagina is pink and rugated.  Normal discharge. Normal appearing cervix. Uterus is normal in size. No adnexal mass or tenderness. EXTREMITIES: No cyanosis, clubbing, or edema, 2+ distal pulses.  A/P 34 yo with DUB - Recent endometrial biopsy in 12/2013 demonstrates a benign polypoid tissue - Discussed with patient surgical treatment with repeat D&C or continued medical management with Mirena IUD - Patient opted for Mirena IUD - Also discussed weight loss regimen  IUD Procedure Note Patient identified, informed consent performed, signed copy in chart, time out was performed.  Urine pregnancy test  negative.  Speculum placed in the vagina.  Cervix visualized.  Cleaned with Betadine x 2.  Grasped anteriorly with a single tooth tenaculum.  Uterus sounded to 10 cm.  Mirena IUD placed per manufacturer's recommendations.  Strings trimmed to 3 cm. Tenaculum was removed, good hemostasis noted.  Patient tolerated procedure well.   Patient given post procedure instructions and Mirena care card with expiration date.  Patient is asked to check IUD strings periodically and follow up in 4-6 weeks for IUD check.

## 2014-09-23 NOTE — Addendum Note (Signed)
Addended by: Shelly Coss on: 09/23/2014 01:34 PM   Modules accepted: Orders

## 2014-10-04 ENCOUNTER — Encounter: Payer: Self-pay | Admitting: *Deleted

## 2014-10-17 ENCOUNTER — Emergency Department (HOSPITAL_COMMUNITY)
Admission: EM | Admit: 2014-10-17 | Discharge: 2014-10-17 | Disposition: A | Discharge status: Home / Self Care | Payer: No Typology Code available for payment source | Attending: Emergency Medicine | Admitting: Emergency Medicine

## 2014-10-17 ENCOUNTER — Encounter (HOSPITAL_COMMUNITY): Payer: Self-pay | Admitting: *Deleted

## 2014-10-17 DIAGNOSIS — Z3202 Encounter for pregnancy test, result negative: Secondary | ICD-10-CM | POA: Diagnosis not present

## 2014-10-17 DIAGNOSIS — K2970 Gastritis, unspecified, without bleeding: Secondary | ICD-10-CM | POA: Insufficient documentation

## 2014-10-17 DIAGNOSIS — Z872 Personal history of diseases of the skin and subcutaneous tissue: Secondary | ICD-10-CM | POA: Insufficient documentation

## 2014-10-17 DIAGNOSIS — J45909 Unspecified asthma, uncomplicated: Secondary | ICD-10-CM | POA: Diagnosis not present

## 2014-10-17 DIAGNOSIS — Z79899 Other long term (current) drug therapy: Secondary | ICD-10-CM | POA: Insufficient documentation

## 2014-10-17 DIAGNOSIS — Z791 Long term (current) use of non-steroidal anti-inflammatories (NSAID): Secondary | ICD-10-CM | POA: Insufficient documentation

## 2014-10-17 DIAGNOSIS — R109 Unspecified abdominal pain: Secondary | ICD-10-CM | POA: Diagnosis present

## 2014-10-17 DIAGNOSIS — Z880 Allergy status to penicillin: Secondary | ICD-10-CM | POA: Diagnosis not present

## 2014-10-17 DIAGNOSIS — Z8742 Personal history of other diseases of the female genital tract: Secondary | ICD-10-CM | POA: Diagnosis not present

## 2014-10-17 DIAGNOSIS — I10 Essential (primary) hypertension: Secondary | ICD-10-CM | POA: Diagnosis not present

## 2014-10-17 DIAGNOSIS — K529 Noninfective gastroenteritis and colitis, unspecified: Secondary | ICD-10-CM

## 2014-10-17 LAB — CBC WITH DIFFERENTIAL/PLATELET
BASOS ABS: 0 10*3/uL (ref 0.0–0.1)
Basophils Relative: 0 % (ref 0–1)
Eosinophils Absolute: 0 10*3/uL (ref 0.0–0.7)
Eosinophils Relative: 0 % (ref 0–5)
HCT: 31.3 % — ABNORMAL LOW (ref 36.0–46.0)
Hemoglobin: 9.1 g/dL — ABNORMAL LOW (ref 12.0–15.0)
LYMPHS ABS: 0.5 10*3/uL — AB (ref 0.7–4.0)
Lymphocytes Relative: 7 % — ABNORMAL LOW (ref 12–46)
MCH: 23 pg — ABNORMAL LOW (ref 26.0–34.0)
MCHC: 29.1 g/dL — AB (ref 30.0–36.0)
MCV: 79 fL (ref 78.0–100.0)
Monocytes Absolute: 0.4 10*3/uL (ref 0.1–1.0)
Monocytes Relative: 5 % (ref 3–12)
Neutro Abs: 6.9 10*3/uL (ref 1.7–7.7)
Neutrophils Relative %: 88 % — ABNORMAL HIGH (ref 43–77)
PLATELETS: 427 10*3/uL — AB (ref 150–400)
RBC: 3.96 MIL/uL (ref 3.87–5.11)
RDW: 14.6 % (ref 11.5–15.5)
WBC: 7.8 10*3/uL (ref 4.0–10.5)

## 2014-10-17 LAB — URINALYSIS, ROUTINE W REFLEX MICROSCOPIC
Bilirubin Urine: NEGATIVE
Glucose, UA: NEGATIVE mg/dL
Ketones, ur: NEGATIVE mg/dL
Nitrite: NEGATIVE
PH: 7.5 (ref 5.0–8.0)
Protein, ur: 30 mg/dL — AB
SPECIFIC GRAVITY, URINE: 1.021 (ref 1.005–1.030)
UROBILINOGEN UA: 0.2 mg/dL (ref 0.0–1.0)

## 2014-10-17 LAB — COMPREHENSIVE METABOLIC PANEL
ALT: 31 U/L (ref 0–35)
ANION GAP: 11 (ref 5–15)
AST: 40 U/L — ABNORMAL HIGH (ref 0–37)
Albumin: 4.3 g/dL (ref 3.5–5.2)
Alkaline Phosphatase: 52 U/L (ref 39–117)
BUN: 9 mg/dL (ref 6–23)
CALCIUM: 9.3 mg/dL (ref 8.4–10.5)
CHLORIDE: 102 mmol/L (ref 96–112)
CO2: 24 mmol/L (ref 19–32)
Creatinine, Ser: 0.78 mg/dL (ref 0.50–1.10)
GFR calc Af Amer: >90 mL/min (ref >90)
GFR calc non Af Amer: >90 mL/min (ref >90)
GLUCOSE: 113 mg/dL — AB (ref 70–99)
POTASSIUM: 4.9 mmol/L (ref 3.5–5.1)
SODIUM: 137 mmol/L (ref 135–145)
Total Bilirubin: 0.8 mg/dL (ref 0.3–1.2)
Total Protein: 7.9 g/dL (ref 6.0–8.3)

## 2014-10-17 LAB — URINE MICROSCOPIC-ADD ON

## 2014-10-17 LAB — LIPASE, BLOOD: LIPASE: 16 U/L (ref 11–59)

## 2014-10-17 LAB — PREGNANCY, URINE: Preg Test, Ur: NEGATIVE

## 2014-10-17 MED ORDER — OXYCODONE-ACETAMINOPHEN 5-325 MG PO TABS: 1.0000 | ORAL_TABLET | Freq: Four times a day (QID) | ORAL | Status: DC | PRN

## 2014-10-17 MED ORDER — ONDANSETRON HCL 4 MG/2ML IJ SOLN
4.0000 mg | Freq: Once | INTRAMUSCULAR | Status: DC
  Filled 2014-10-17: qty 2

## 2014-10-17 MED ORDER — ONDANSETRON HCL 4 MG PO TABS: 4.0000 mg | ORAL_TABLET | Freq: Four times a day (QID) | ORAL | Status: DC

## 2014-10-17 MED ORDER — ONDANSETRON 8 MG PO TBDP: 8.0000 mg | ORAL_TABLET | Freq: Once | ORAL | Status: DC

## 2014-10-17 MED ORDER — ONDANSETRON HCL 4 MG/2ML IJ SOLN
4.0000 mg | INTRAMUSCULAR | Status: AC
  Administered 2014-10-17: 4 mg via INTRAMUSCULAR

## 2014-10-17 MED ORDER — HYDROMORPHONE HCL 1 MG/ML IJ SOLN
1.0000 mg | Freq: Once | INTRAMUSCULAR | Status: AC
  Administered 2014-10-17: 1 mg via INTRAVENOUS
  Filled 2014-10-17: qty 1

## 2014-10-17 MED ORDER — OMEPRAZOLE 20 MG PO CPDR: 20.0000 mg | DELAYED_RELEASE_CAPSULE | Freq: Every day | ORAL | Status: DC

## 2014-10-17 NOTE — ED Provider Notes (Signed)
CSN: [id number]     Arrival date & time 10/17/14  1034 History   First MD Initiated Contact with Patient 10/17/14 1106     Chief Complaint  Patient presents with  . Abdominal Pain  . Diarrhea  . Emesis     (Consider location/radiation/quality/duration/timing/severity/associated sxs/prior Treatment) HPI Jalyric Kaestner is a 34 year old female with past medical history of hypertension who presents the ER complaining of nausea, vomiting and abdominal pain, diarrhea. Patient states she was fasting for 5 days, and yesterday began to eat again breakfast. Patient states she exhibits grits and fried fish for breakfast. Patient states she felt nauseated throughout the day, it again at dinnertime then began to have diarrhea, upper abdominal pain, nausea, vomiting throughout the night. Patient states her pain and nausea and vomiting became acutely worse this morning. Patient claims generalized upper and left-sided abdominal pain. Patient denies associated chest pain, shortness of breath, dysuria, fever.  Past Medical History  Diagnosis Date  . Asthma   . Hypertension   . Hx of Bell's palsy June 2013  . Psoriasis   . Menometrorrhagia    Past Surgical History  Procedure Laterality Date  . Combined hysteroscopy diagnostic / d&c N/A 2011    Benign secretory endometrium  . Dilation and curettage of uterus  10/2003    Polyp, mild atypia, simple and complex hyperplasia,  . Dilation and curettage of uterus  11/2003    Simple and complex hyperplasia   Family History  Problem Relation Age of Onset  . Hypertension Other   . Diabetes Other   . Cancer Other    History  Substance Use Topics  . Smoking status: Never Smoker   . Smokeless tobacco: Never Used  . Alcohol Use: No   OB History    Gravida Para Term Preterm AB TAB SAB Ectopic Multiple Living   0 0 0 0 0 0 0 0 0 0      Review of Systems  Constitutional: Negative for fever.  HENT: Negative for trouble swallowing.   Eyes: Negative for  visual disturbance.  Respiratory: Negative for shortness of breath.   Cardiovascular: Negative for chest pain.  Gastrointestinal: Positive for nausea, vomiting and abdominal pain.  Genitourinary: Negative for dysuria.  Musculoskeletal: Negative for neck pain.  Skin: Negative for rash.  Neurological: Negative for dizziness, weakness and numbness.  Psychiatric/Behavioral: Negative.       Allergies  Codeine; Peanut-containing drug products; Penicillins; and Shellfish allergy  Home Medications   Prior to Admission medications   Medication Sig Start Date End Date Taking? Authorizing Provider  albuterol (PROVENTIL HFA;VENTOLIN HFA) 108 (90 BASE) MCG/ACT inhaler Inhale 2 puffs into the lungs every 4 (four) hours as needed for wheezing or shortness of breath. For cough/wheeze.   Yes Historical Provider, MD  amLODipine (NORVASC) 10 MG tablet Take 10 mg by mouth every evening.    Yes Historical Provider, MD  ibuprofen (ADVIL,MOTRIN) 800 MG tablet Take 1 tablet (800 mg total) by mouth every 8 (eight) hours as needed. Patient taking differently: Take 800 mg by mouth every 8 (eight) hours as needed for headache or cramping.  06/28/14  Yes Truett Mainland, DO  levonorgestrel (MIRENA) 20 MCG/24HR IUD 1 each by Intrauterine route once. 09/23/14  Yes Historical Provider, MD  losartan (COZAAR) 25 MG tablet Take 25 mg by mouth daily.   Yes Historical Provider, MD  medroxyPROGESTERone (PROVERA) 10 MG tablet Take 2 tablets (20 mg total) by mouth daily. 2 tablets every 8 hours for one week.  Then 2 tablets per day Patient not taking: Reported on 10/17/2014 09/11/14   Seabron Spates, CNM  omeprazole (PRILOSEC) 20 MG capsule Take 1 capsule (20 mg total) by mouth daily. 10/17/14   Dahlia Bailiff, PA-C  ondansetron (ZOFRAN) 4 MG tablet Take 1 tablet (4 mg total) by mouth every 6 (six) hours. 10/17/14   Dahlia Bailiff, PA-C  oxyCODONE-acetaminophen (PERCOCET) 5-325 MG per tablet Take 1-2 tablets by mouth every 6 (six) hours as  needed. 10/17/14   Dahlia Bailiff, PA-C  polyethylene glycol (MIRALAX / GLYCOLAX) packet Take 17 g by mouth daily. Patient not taking: Reported on 10/17/2014 12/10/13   Manya Silvas, CNM   BP 154/68 mmHg  Pulse 86  Temp(Src) 98 F (36.7 C) (Oral)  Resp 18  SpO2 100%  LMP 09/25/2014 Physical Exam  Constitutional: She is oriented to person, place, and time. She appears well-developed and well-nourished. No distress.  Morbidly obese female in mild to moderate distress from pain.  HENT:  Head: Normocephalic and atraumatic.  Mouth/Throat: Oropharynx is clear and moist. No oropharyngeal exudate.  Eyes: Right eye exhibits no discharge. Left eye exhibits no discharge. No scleral icterus.  Neck: Normal range of motion.  Cardiovascular: Normal rate, regular rhythm and normal heart sounds.   No murmur heard. Pulmonary/Chest: Effort normal and breath sounds normal. No respiratory distress.  Abdominal: Soft. Normal appearance and bowel sounds are normal. There is tenderness in the epigastric area and left upper quadrant.  Generalized abdominal tenderness most significant on the epigastric, upper left.  Musculoskeletal: Normal range of motion. She exhibits no edema or tenderness.  Neurological: She is alert and oriented to person, place, and time. No cranial nerve deficit. Coordination normal.  Skin: Skin is warm and dry. No rash noted. She is not diaphoretic.  Psychiatric: She has a normal mood and affect.  Nursing note and vitals reviewed.   ED Course  Procedures (including critical care time) Labs Review Labs Reviewed  COMPREHENSIVE METABOLIC PANEL - Abnormal; Notable for the following:    Glucose, Bld 113 (*)    AST 40 (*)    All other components within normal limits  URINALYSIS, ROUTINE W REFLEX MICROSCOPIC - Abnormal; Notable for the following:    Hgb urine dipstick MODERATE (*)    Protein, ur 30 (*)    Leukocytes, UA TRACE (*)    All other components within normal limits  URINE  MICROSCOPIC-ADD ON - Abnormal; Notable for the following:    Squamous Epithelial / LPF FEW (*)    All other components within normal limits  CBC WITH DIFFERENTIAL/PLATELET - Abnormal; Notable for the following:    Hemoglobin 9.1 (*)    HCT 31.3 (*)    MCH 23.0 (*)    MCHC 29.1 (*)    Platelets 427 (*)    Neutrophils Relative % 88 (*)    Lymphocytes Relative 7 (*)    Lymphs Abs 0.5 (*)    All other components within normal limits  LIPASE, BLOOD  PREGNANCY, URINE  CBC WITH DIFFERENTIAL/PLATELET    Imaging Review No results found.   EKG Interpretation None      MDM   Final diagnoses:  Colitis  Gastritis    Nausea, vomiting, abd pain, diarrhea since yesterday.  Patient's symptoms improved with symptomatic therapy here. Signs and symptoms most consistent with a gastritis versus a colitis. Patient comfortable, non-tachypnea, non-tachycardic, afebrile, non-hypoxic, well-appearing and in no acute distress after symptoms managed.  Patient is nontoxic, nonseptic appearing, in no apparent  distress.  Patient's pain and other symptoms adequately managed in emergency department.  Fluid bolus given.  Labs, imaging and vitals reviewed.  Patient does not meet the SIRS or Sepsis criteria.  On repeat exam patient does not have a surgical abdomin and there are no peritoneal signs.  No indication of appendicitis, bowel obstruction, bowel perforation, cholecystitis, diverticulitis, PID or ectopic pregnancy.  Patient discharged home with symptomatic treatment and given strict instructions for follow-up with their primary care physician.  I have also discussed reasons to return immediately to the ER.  Patient expresses understanding and agrees with plan.  BP 154/68 mmHg  Pulse 86  Temp(Src) 98 F (36.7 C) (Oral)  Resp 18  SpO2 100%  LMP 09/25/2014  Signed,  Dahlia Bailiff, PA-C 1:39 PM  Patient discussed with Dr. Blanchie Dessert, MD  Dahlia Bailiff, PA-C 10/19/14 [place],  MD 10/21/14 [phone]

## 2014-10-17 NOTE — Discharge Instructions (Signed)
Gastritis, Adult Gastritis is soreness and swelling (inflammation) of the lining of the stomach. Gastritis can develop as a sudden onset (acute) or long-term (chronic) condition. If gastritis is not treated, it can lead to stomach bleeding and ulcers. CAUSES  Gastritis occurs when the stomach lining is weak or damaged. Digestive juices from the stomach then inflame the weakened stomach lining. The stomach lining may be weak or damaged due to viral or bacterial infections. One common bacterial infection is the Helicobacter pylori infection. Gastritis can also result from excessive alcohol consumption, taking certain medicines, or having too much acid in the stomach.  SYMPTOMS  In some cases, there are no symptoms. When symptoms are present, they may include:  Pain or a burning sensation in the upper abdomen.  Nausea.  Vomiting.  An uncomfortable feeling of fullness after eating. DIAGNOSIS  Your caregiver may suspect you have gastritis based on your symptoms and a physical exam. To determine the cause of your gastritis, your caregiver may perform the following:  Blood or stool tests to check for the H pylori bacterium.  Gastroscopy. A thin, flexible tube (endoscope) is passed down the esophagus and into the stomach. The endoscope has a light and camera on the end. Your caregiver uses the endoscope to view the inside of the stomach.  Taking a tissue sample (biopsy) from the stomach to examine under a microscope. TREATMENT  Depending on the cause of your gastritis, medicines may be prescribed. If you have a bacterial infection, such as an H pylori infection, antibiotics may be given. If your gastritis is caused by too much acid in the stomach, H2 blockers or antacids may be given. Your caregiver may recommend that you stop taking aspirin, ibuprofen, or other nonsteroidal anti-inflammatory drugs (NSAIDs). HOME CARE INSTRUCTIONS  Only take over-the-counter or prescription medicines as directed by  your caregiver.  If you were given antibiotic medicines, take them as directed. Finish them even if you start to feel better.  Drink enough fluids to keep your urine clear or pale yellow.  Avoid foods and drinks that make your symptoms worse, such as:  Caffeine or alcoholic drinks.  Chocolate.  Peppermint or mint flavorings.  Garlic and onions.  Spicy foods.  Citrus fruits, such as oranges, lemons, or limes.  Tomato-based foods such as sauce, chili, salsa, and pizza.  Fried and fatty foods.  Eat small, frequent meals instead of large meals. SEEK IMMEDIATE MEDICAL CARE IF:   You have black or dark red stools.  You vomit blood or material that looks like coffee grounds.  You are unable to keep fluids down.  Your abdominal pain gets worse.  You have a fever.  You do not feel better after 1 week.  You have any other questions or concerns. MAKE SURE YOU:  Understand these instructions.  Will watch your condition.  Will get help right away if you are not doing well or get worse. Document Released: 07/10/2001 Document Revised: 01/15/2012 Document Reviewed: 08/29/2011 Exit[employer] Patient Information 2015 [employer]. This information is not intended to replace advice given to you by your health care provider. Make sure you discuss any questions you have with your health care provider.  Food Choices for Gastroesophageal Reflux Disease When you have gastroesophageal reflux disease (GERD), the foods you eat and your eating habits are very important. Choosing the right foods can help ease the discomfort of GERD. WHAT GENERAL GUIDELINES DO I NEED TO FOLLOW?  Choose fruits, vegetables, whole grains, low-fat dairy products, and low-fat   meat, fish, and poultry.  Limit fats such as oils, salad dressings, butter, nuts, and avocado.  Keep a food diary to identify foods that cause symptoms.  Avoid foods that cause reflux. These may be different for different people.  Eat  frequent small meals instead of three large meals each day.  Eat your meals slowly, in a relaxed setting.  Limit fried foods.  Cook foods using methods other than frying.  Avoid drinking alcohol.  Avoid drinking large amounts of liquids with your meals.  Avoid bending over or lying down until 2-3 hours after eating. WHAT FOODS ARE NOT RECOMMENDED? The following are some foods and drinks that may worsen your symptoms: Vegetables Tomatoes. Tomato juice. Tomato and spaghetti sauce. Chili peppers. Onion and garlic. Horseradish. Fruits Oranges, grapefruit, and lemon (fruit and juice). Meats High-fat meats, fish, and poultry. This includes hot dogs, ribs, ham, sausage, salami, and bacon. Dairy Whole milk and chocolate milk. Sour cream. Cream. Butter. Ice cream. Cream cheese.  Beverages Coffee and tea, with or without caffeine. Carbonated beverages or energy drinks. Condiments Hot sauce. Barbecue sauce.  Sweets/Desserts Chocolate and cocoa. Donuts. Peppermint and spearmint. Fats and Oils High-fat foods, including French fries and potato chips. Other Vinegar. Strong spices, such as black pepper, white pepper, red pepper, cayenne, curry powder, cloves, ginger, and chili powder. The items listed above may not be a complete list of foods and beverages to avoid. Contact your dietitian for more information. Document Released: 07/16/2005 Document Revised: 07/21/2013 Document Reviewed: 05/20/2013 [employer] Patient Information 2015 [employer]. This information is not intended to replace advice given to you by your health care provider. Make sure you discuss any questions you have with your health care provider.   

## 2014-10-17 NOTE — ED Notes (Signed)
Difficulties obtaining IV access and labs. Manuela Schwartz RN to attempt with [place].

## 2014-10-17 NOTE — ED Notes (Signed)
Unable to obtain IV access. IV team will be consulted.

## 2014-10-17 NOTE — ED Notes (Signed)
IV Team at bedside 

## 2014-10-17 NOTE — ED Notes (Addendum)
Pt presents to ED with c/o generalized abdominal pain with diarrhea, nausea and vomiting. Denies any sick contact. Pt vomiting in triage.

## 2014-10-17 NOTE — ED Notes (Signed)
Pt alert, oriented, and ambulatory upon Dc. She was advised to follow up with PCP and to drink plenty of fluids.

## 2014-10-17 NOTE — ED Notes (Signed)
Pt made aware of need for urine specimen. Pt states she can not void at this time.

## 2014-10-17 NOTE — ED Notes (Signed)
One unsuccessful attempt at IV insertion and lab collection. Second RN to attempt.

## 2014-10-17 NOTE — ED Notes (Signed)
Nurse currently starting IV 

## 2014-10-19 ENCOUNTER — Emergency Department (HOSPITAL_COMMUNITY): Payer: No Typology Code available for payment source

## 2014-10-19 ENCOUNTER — Encounter (HOSPITAL_COMMUNITY)
Admission: EM | Disposition: A | Discharge status: Home / Self Care | Payer: Self-pay | Source: Home / Self Care | Attending: Emergency Medicine

## 2014-10-19 ENCOUNTER — Ambulatory Visit (HOSPITAL_COMMUNITY)
Admission: EM | Admit: 2014-10-19 | Discharge: 2014-10-20 | Disposition: A | Discharge status: Home / Self Care | Payer: No Typology Code available for payment source | Attending: Emergency Medicine | Admitting: Emergency Medicine

## 2014-10-19 ENCOUNTER — Emergency Department (HOSPITAL_COMMUNITY): Payer: No Typology Code available for payment source | Admitting: Certified Registered Nurse Anesthetist

## 2014-10-19 ENCOUNTER — Encounter (HOSPITAL_COMMUNITY): Payer: Self-pay | Admitting: Emergency Medicine

## 2014-10-19 DIAGNOSIS — I10 Essential (primary) hypertension: Secondary | ICD-10-CM | POA: Diagnosis not present

## 2014-10-19 DIAGNOSIS — J45909 Unspecified asthma, uncomplicated: Secondary | ICD-10-CM | POA: Diagnosis not present

## 2014-10-19 DIAGNOSIS — Z79899 Other long term (current) drug therapy: Secondary | ICD-10-CM | POA: Diagnosis not present

## 2014-10-19 DIAGNOSIS — N201 Calculus of ureter: Secondary | ICD-10-CM | POA: Insufficient documentation

## 2014-10-19 DIAGNOSIS — N132 Hydronephrosis with renal and ureteral calculous obstruction: Secondary | ICD-10-CM

## 2014-10-19 DIAGNOSIS — E669 Obesity, unspecified: Secondary | ICD-10-CM | POA: Insufficient documentation

## 2014-10-19 DIAGNOSIS — G510 Bell's palsy: Secondary | ICD-10-CM | POA: Insufficient documentation

## 2014-10-19 HISTORY — PX: STONE EXTRACTION WITH BASKET: SHX5318

## 2014-10-19 HISTORY — PX: CYSTOSCOPY WITH STENT PLACEMENT: SHX5790

## 2014-10-19 HISTORY — PX: HOLMIUM LASER APPLICATION: SHX5852

## 2014-10-19 LAB — COMPREHENSIVE METABOLIC PANEL
ALBUMIN: 3.8 g/dL (ref 3.5–5.2)
ALT: 14 U/L (ref 0–35)
AST: 14 U/L (ref 0–37)
Alkaline Phosphatase: 41 U/L (ref 39–117)
Anion gap: 9 (ref 5–15)
BILIRUBIN TOTAL: 0.5 mg/dL (ref 0.3–1.2)
BUN: 6 mg/dL (ref 6–23)
CALCIUM: 8.7 mg/dL (ref 8.4–10.5)
CO2: 26 mmol/L (ref 19–32)
Chloride: 103 mmol/L (ref 96–112)
Creatinine, Ser: 1.01 mg/dL (ref 0.50–1.10)
GFR calc Af Amer: 84 mL/min — ABNORMAL LOW (ref >90)
GFR calc non Af Amer: 73 mL/min — ABNORMAL LOW (ref >90)
Glucose, Bld: 80 mg/dL (ref 70–99)
Potassium: 3.5 mmol/L (ref 3.5–5.1)
Sodium: 138 mmol/L (ref 135–145)
TOTAL PROTEIN: 6.9 g/dL (ref 6.0–8.3)

## 2014-10-19 LAB — CBC WITH DIFFERENTIAL/PLATELET
Basophils Absolute: 0 10*3/uL (ref 0.0–0.1)
Basophils Relative: 0 % (ref 0–1)
EOS ABS: 0.1 10*3/uL (ref 0.0–0.7)
Eosinophils Relative: 1 % (ref 0–5)
HEMATOCRIT: 26.4 % — AB (ref 36.0–46.0)
HEMOGLOBIN: 7.8 g/dL — AB (ref 12.0–15.0)
LYMPHS ABS: 1.2 10*3/uL (ref 0.7–4.0)
Lymphocytes Relative: 22 % (ref 12–46)
MCH: 23.4 pg — ABNORMAL LOW (ref 26.0–34.0)
MCHC: 29.5 g/dL — AB (ref 30.0–36.0)
MCV: 79.3 fL (ref 78.0–100.0)
Monocytes Absolute: 0.6 10*3/uL (ref 0.1–1.0)
Monocytes Relative: 11 % (ref 3–12)
Neutro Abs: 3.5 10*3/uL (ref 1.7–7.7)
Neutrophils Relative %: 66 % (ref 43–77)
Platelets: 326 10*3/uL (ref 150–400)
RBC: 3.33 MIL/uL — AB (ref 3.87–5.11)
RDW: 14.7 % (ref 11.5–15.5)
WBC: 5.4 10*3/uL (ref 4.0–10.5)

## 2014-10-19 LAB — POC URINE PREG, ED: Preg Test, Ur: NEGATIVE

## 2014-10-19 LAB — LIPASE, BLOOD: LIPASE: 15 U/L (ref 11–59)

## 2014-10-19 SURGERY — CYSTOSCOPY, WITH STENT INSERTION
Anesthesia: General | Site: Ureter | Laterality: Left

## 2014-10-19 MED ORDER — IOHEXOL 300 MG/ML  SOLN
INTRAMUSCULAR | Status: DC | PRN
  Administered 2014-10-19: 8 mL via URETHRAL

## 2014-10-19 MED ORDER — PROMETHAZINE HCL 25 MG/ML IJ SOLN: 6.2500 mg | INTRAMUSCULAR | Status: DC | PRN

## 2014-10-19 MED ORDER — LACTATED RINGERS IV SOLN
INTRAVENOUS | Status: DC | PRN
  Administered 2014-10-19: 22:00:00 via INTRAVENOUS

## 2014-10-19 MED ORDER — IOHEXOL 300 MG/ML  SOLN
100.0000 mL | Freq: Once | INTRAMUSCULAR | Status: AC | PRN
  Administered 2014-10-19: 100 mL via INTRAVENOUS

## 2014-10-19 MED ORDER — FENTANYL CITRATE 0.05 MG/ML IJ SOLN
INTRAMUSCULAR | Status: DC | PRN
  Administered 2014-10-19 (×3): 25 ug via INTRAVENOUS

## 2014-10-19 MED ORDER — FENTANYL CITRATE 0.05 MG/ML IJ SOLN
50.0000 ug | Freq: Once | INTRAMUSCULAR | Status: AC
  Administered 2014-10-19: 50 ug via NASAL
  Filled 2014-10-19: qty 2

## 2014-10-19 MED ORDER — PROPOFOL 10 MG/ML IV BOLUS
INTRAVENOUS | Status: AC
  Filled 2014-10-19: qty 20

## 2014-10-19 MED ORDER — LIDOCAINE HCL (CARDIAC) 20 MG/ML IV SOLN
INTRAVENOUS | Status: AC
  Filled 2014-10-19: qty 5

## 2014-10-19 MED ORDER — PHENAZOPYRIDINE HCL 200 MG PO TABS: 200.0000 mg | ORAL_TABLET | Freq: Three times a day (TID) | ORAL | Status: DC | PRN

## 2014-10-19 MED ORDER — HYDROMORPHONE HCL 1 MG/ML IJ SOLN
1.0000 mg | Freq: Once | INTRAMUSCULAR | Status: AC
  Administered 2014-10-19: 1 mg via INTRAVENOUS
  Filled 2014-10-19: qty 1

## 2014-10-19 MED ORDER — METOCLOPRAMIDE HCL 5 MG/ML IJ SOLN
INTRAMUSCULAR | Status: DC | PRN
  Administered 2014-10-19: 10 mg via INTRAVENOUS

## 2014-10-19 MED ORDER — ONDANSETRON HCL 4 MG/2ML IJ SOLN
INTRAMUSCULAR | Status: DC | PRN
  Administered 2014-10-19: 4 mg via INTRAVENOUS

## 2014-10-19 MED ORDER — LIDOCAINE HCL 2 % EX GEL
CUTANEOUS | Status: AC
  Filled 2014-10-19: qty 10

## 2014-10-19 MED ORDER — MIDAZOLAM HCL 5 MG/5ML IJ SOLN
INTRAMUSCULAR | Status: DC | PRN
  Administered 2014-10-19: .25 mg via INTRAVENOUS

## 2014-10-19 MED ORDER — FENTANYL CITRATE 0.05 MG/ML IJ SOLN: 25.0000 ug | INTRAMUSCULAR | Status: DC | PRN

## 2014-10-19 MED ORDER — MEPERIDINE HCL 50 MG/ML IJ SOLN: 6.2500 mg | INTRAMUSCULAR | Status: DC | PRN

## 2014-10-19 MED ORDER — FENTANYL CITRATE 0.05 MG/ML IJ SOLN
INTRAMUSCULAR | Status: AC
  Filled 2014-10-19: qty 5

## 2014-10-19 MED ORDER — DEXAMETHASONE SODIUM PHOSPHATE 10 MG/ML IJ SOLN
INTRAMUSCULAR | Status: AC
  Filled 2014-10-19: qty 1

## 2014-10-19 MED ORDER — CIPROFLOXACIN HCL 500 MG PO TABS: 500.0000 mg | ORAL_TABLET | Freq: Once | ORAL | Status: DC

## 2014-10-19 MED ORDER — MIDAZOLAM HCL 2 MG/2ML IJ SOLN
INTRAMUSCULAR | Status: AC
  Filled 2014-10-19: qty 2

## 2014-10-19 MED ORDER — CIPROFLOXACIN IN D5W 400 MG/200ML IV SOLN
INTRAVENOUS | Status: AC
  Filled 2014-10-19: qty 200

## 2014-10-19 MED ORDER — SODIUM CHLORIDE 0.9 % IR SOLN
Status: DC | PRN
  Administered 2014-10-19: 4000 mL

## 2014-10-19 MED ORDER — LIDOCAINE HCL 2 % EX GEL
CUTANEOUS | Status: DC | PRN
  Administered 2014-10-19: 1 via TOPICAL

## 2014-10-19 MED ORDER — DEXAMETHASONE SODIUM PHOSPHATE 10 MG/ML IJ SOLN
INTRAMUSCULAR | Status: DC | PRN
  Administered 2014-10-19: 10 mg via INTRAVENOUS

## 2014-10-19 MED ORDER — ONDANSETRON HCL 4 MG/2ML IJ SOLN
INTRAMUSCULAR | Status: AC
  Filled 2014-10-19: qty 2

## 2014-10-19 MED ORDER — CIPROFLOXACIN IN D5W 400 MG/200ML IV SOLN
INTRAVENOUS | Status: DC | PRN
  Administered 2014-10-19: 400 mg via INTRAVENOUS

## 2014-10-19 MED ORDER — METOCLOPRAMIDE HCL 5 MG/ML IJ SOLN
INTRAMUSCULAR | Status: AC
  Filled 2014-10-19: qty 2

## 2014-10-19 MED ORDER — LACTATED RINGERS IV SOLN
INTRAVENOUS | Status: DC
  Administered 2014-10-19: via INTRAVENOUS

## 2014-10-19 MED ORDER — TAMSULOSIN HCL 0.4 MG PO CAPS: 0.4000 mg | ORAL_CAPSULE | Freq: Every day | ORAL | Status: DC

## 2014-10-19 MED ORDER — LIDOCAINE HCL (CARDIAC) 20 MG/ML IV SOLN
INTRAVENOUS | Status: DC | PRN
  Administered 2014-10-19: 75 mg via INTRAVENOUS

## 2014-10-19 MED ORDER — SODIUM CHLORIDE 0.9 % IV BOLUS (SEPSIS)
1000.0000 mL | Freq: Once | INTRAVENOUS | Status: AC
  Administered 2014-10-19: 1000 mL via INTRAVENOUS

## 2014-10-19 MED ORDER — OXYCODONE-ACETAMINOPHEN 5-325 MG PO TABS: 1.0000 | ORAL_TABLET | Freq: Four times a day (QID) | ORAL | Status: DC | PRN

## 2014-10-19 MED ORDER — PROPOFOL 10 MG/ML IV BOLUS
INTRAVENOUS | Status: DC | PRN
  Administered 2014-10-19: 50 mg via INTRAVENOUS
  Administered 2014-10-19: 200 mg via INTRAVENOUS

## 2014-10-19 SURGICAL SUPPLY — 17 items
BAG URO CATCHER STRL LF (DRAPE) ×2 IMPLANT
BASKET LASER NITINOL 1.9FR (BASKET) IMPLANT
BASKET ZERO TIP NITINOL 2.4FR (BASKET) IMPLANT
BSKT STON RTRVL 120 1.9FR (BASKET)
BSKT STON RTRVL ZERO TP 2.4FR (BASKET)
CATH URET 5FR 28IN OPEN ENDED (CATHETERS) ×2 IMPLANT
CLOTH BEACON ORANGE TIMEOUT ST (SAFETY) ×2 IMPLANT
EXTRACTOR STONE NITINOL NGAGE (UROLOGICAL SUPPLIES) ×2 IMPLANT
FIBER LASER FLEXIVA 365 (UROLOGICAL SUPPLIES) ×1 IMPLANT
GLOVE BIOGEL M STRL SZ7.5 (GLOVE) ×4 IMPLANT
GOWN STRL REUS W/TWL XL LVL3 (GOWN DISPOSABLE) ×3 IMPLANT
GUIDEWIRE ANG ZIPWIRE 038X150 (WIRE) IMPLANT
GUIDEWIRE STR DUAL SENSOR (WIRE) ×3 IMPLANT
PACK CYSTO (CUSTOM PROCEDURE TRAY) ×2 IMPLANT
STENT CONTOUR 6FRX26X.038 (STENTS) ×1 IMPLANT
TUBE FEEDING 8FR 16IN STR KANG (MISCELLANEOUS) IMPLANT
TUBING CONNECTING 10 (TUBING) ×2 IMPLANT

## 2014-10-19 NOTE — ED Provider Notes (Signed)
CSN: [id number]     Arrival date & time 10/19/14  1329 History   First MD Initiated Contact with Patient 10/19/14 1543     Chief Complaint  Patient presents with  . Abdominal Pain     (Consider location/radiation/quality/duration/timing/severity/associated sxs/prior Treatment) Patient is a 34 y.o. female presenting with abdominal pain. The history is provided by the patient.  Abdominal Pain Associated symptoms: diarrhea, nausea and vomiting   Associated symptoms: no chest pain, no dysuria, no fatigue, no fever, no vaginal bleeding and no vaginal discharge    patient was sent in by her primary care doctor for CT scan of the abdomen. Had a few days of nausea vomiting and diarrhea. That stopped a couple days ago. She had abdominal pain with it on the left side. Nausea vomiting diarrhea is improved. She was seen in the ER 2 days ago and had reassuring blood work that time. Followed up with her PCP today. Continued pain. No bowel movement since Saturday. No fevers. She states she's been eating less because she is nervous of being nauseous. The pain is constant. As dull. It is worse with movement. According to the PCPs note there were unable to contact the insurance company to arrange an outpatient CT so she was sent to the ER.  Past Medical History  Diagnosis Date  . Asthma   . Hypertension   . Hx of Bell's palsy June 2013  . Psoriasis   . Menometrorrhagia    Past Surgical History  Procedure Laterality Date  . Combined hysteroscopy diagnostic / d&c N/A 2011    Benign secretory endometrium  . Dilation and curettage of uterus  10/2003    Polyp, mild atypia, simple and complex hyperplasia,  . Dilation and curettage of uterus  11/2003    Simple and complex hyperplasia   Family History  Problem Relation Age of Onset  . Hypertension Other   . Diabetes Other   . Cancer Other    History  Substance Use Topics  . Smoking status: Never Smoker   . Smokeless tobacco: Never Used  . Alcohol Use:  No   OB History    Gravida Para Term Preterm AB TAB SAB Ectopic Multiple Living   0 0 0 0 0 0 0 0 0 0      Review of Systems  Constitutional: Positive for appetite change. Negative for fever, diaphoresis and fatigue.  Respiratory: Negative for chest tightness.   Cardiovascular: Negative for chest pain.  Gastrointestinal: Positive for nausea, vomiting, abdominal pain and diarrhea.  Genitourinary: Negative for dysuria, vaginal bleeding and vaginal discharge.  Skin: Negative for wound.  Neurological: Negative for light-headedness.  Hematological: Negative for adenopathy.      Allergies  Codeine; Peanut-containing drug products; Penicillins; and Shellfish allergy  Home Medications   Prior to Admission medications   Medication Sig Start Date End Date Taking? Authorizing Provider  albuterol (PROVENTIL HFA;VENTOLIN HFA) 108 (90 BASE) MCG/ACT inhaler Inhale 2 puffs into the lungs every 4 (four) hours as needed for wheezing or shortness of breath. For cough/wheeze.   Yes Historical Provider, MD  ibuprofen (ADVIL,MOTRIN) 800 MG tablet Take 1 tablet (800 mg total) by mouth every 8 (eight) hours as needed. Patient taking differently: Take 800 mg by mouth every 8 (eight) hours as needed for headache or cramping.  06/28/14  Yes Truett Mainland, DO  levonorgestrel (MIRENA) 20 MCG/24HR IUD 1 each by Intrauterine route once. 09/23/14  Yes Historical Provider, MD  omeprazole (PRILOSEC) 20 MG capsule Take 1  capsule (20 mg total) by mouth daily. 10/17/14  Yes Dahlia Bailiff, PA-C  ondansetron (ZOFRAN) 4 MG tablet Take 1 tablet (4 mg total) by mouth every 6 (six) hours. 10/17/14  Yes Dahlia Bailiff, PA-C  ciprofloxacin (CIPRO) 500 MG tablet Take 1 tablet (500 mg total) by mouth once. 10/19/14   Ardis Hughs, MD  medroxyPROGESTERone (PROVERA) 10 MG tablet Take 2 tablets (20 mg total) by mouth daily. 2 tablets every 8 hours for one week. Then 2 tablets per day Patient not taking: Reported on 10/17/2014 09/11/14    Seabron Spates, CNM  oxyCODONE-acetaminophen (PERCOCET) 5-325 MG per tablet Take 1-2 tablets by mouth every 6 (six) hours as needed. 10/19/14   Ardis Hughs, MD  phenazopyridine (PYRIDIUM) 200 MG tablet Take 1 tablet (200 mg total) by mouth 3 (three) times daily as needed for pain. 10/19/14   Ardis Hughs, MD  polyethylene glycol [facility] / Floria Raveling) packet Take 17 g by mouth daily. Patient not taking: Reported on 10/17/2014 12/10/13   Manya Silvas, CNM  tamsulosin (FLOMAX) 0.4 MG CAPS capsule Take 1 capsule (0.4 mg total) by mouth daily. 10/19/14   Ardis Hughs, MD   BP 120/64 mmHg  Pulse 81  Temp(Src) 98.6 F (37 C) (Oral)  Resp 18  SpO2 99%  LMP 09/25/2014 Physical Exam  Constitutional: She is oriented to person, place, and time.  Patient is morbidly obese  HENT:  Head: Normocephalic.  Eyes: Pupils are equal, round, and reactive to light.  Neck: Neck supple.  Cardiovascular: Normal rate and regular rhythm.   Pulmonary/Chest: Effort normal and breath sounds normal.  Abdominal: Soft. There is no tenderness.  Epigastric and left-sided abdominal tenderness. Somewhat difficult examination due to body habitus.  Musculoskeletal: Normal range of motion.  Neurological: She is alert and oriented to person, place, and time.  Skin: Skin is warm.  Psychiatric: She has a normal mood and affect.    ED Course  Procedures (including critical care time) Labs Review Labs Reviewed  CBC WITH DIFFERENTIAL/PLATELET - Abnormal; Notable for the following:    RBC 3.33 (*)    Hemoglobin 7.8 (*)    HCT 26.4 (*)    MCH 23.4 (*)    MCHC 29.5 (*)    All other components within normal limits  COMPREHENSIVE METABOLIC PANEL - Abnormal; Notable for the following:    GFR calc non Af Amer 73 (*)    GFR calc Af Amer 84 (*)    All other components within normal limits  LIPASE, BLOOD  POC URINE PREG, ED    Imaging Review Ct Abdomen Pelvis W Contrast  10/19/2014   CLINICAL DATA:   Increasing abdominal pain and nausea for 3 days, left-sided. Constipation. Hematuria.  EXAM: CT ABDOMEN AND PELVIS WITH CONTRAST  TECHNIQUE: Multidetector CT imaging of the abdomen and pelvis was performed using the standard protocol following bolus administration of intravenous contrast.  CONTRAST:  161mL OMNIPAQUE IOHEXOL 300 MG/ML  SOLN  COMPARISON:  09/11/2014  FINDINGS: Lower chest:  Trace inferior pericardial effusion.  Hepatobiliary: Unremarkable  Pancreas: Unremarkable  Spleen: Unremarkable  Adrenals/Urinary Tract: Asymmetric hypodensity and enlargement of the left kidney compared to the right. Mild left hydronephrosis due to a 6 by 4 mm left UPJ calculus. No distal hydroureter. No additional calculi observed.  Stomach/Bowel: Unremarkable  Vascular/Lymphatic: Unremarkable  Reproductive: Low-density enlargement of the left ovary, 4.3 by 4.1 by 3.5 cm, possibly from a left ovarian cyst.  Other: Trace pelvic ascites in the  cul-de-sac.  Musculoskeletal: Unremarkable  IMPRESSION: 1. The dominant finding is abnormal enlargement and abnormal hypodensity of the left kidney. There is a 6 by 4 mm left UPJ calculus and some minimal left hydronephrosis, but the degree of renal expansion and hypodensity is unusually prominent for the mild degree of hydronephrosis. A superimposed process such as pyelonephritis is not entirely excluded. 2. Low-density enlargement the left ovary, possibly a left ovarian cyst. There is also a small amount of free pelvic fluid in the cul-de-sac.   Electronically Signed   By: Van Clines M.D.   On: 10/19/2014 19:16     EKG Interpretation None      MDM   Final diagnoses:  Ureteral stone with hydronephrosis    Patient with left-sided abdominal pain. Found to have ureteral stone that is large proximal. Dr. Louis Meckel has seen the patient will take her for stenting.    Davonna Belling, MD 10/19/14 2152

## 2014-10-19 NOTE — Op Note (Signed)
Preoperative diagnosis: left ureteral calculus  Postoperative diagnosis: left ureteral calculus  Procedure:  1. Cystoscopy 2. left ureteroscopy and stone removal 3. Ureteroscopic laser lithotripsy 4. left 45F x 26cm ureteral stent placement  5. left retrograde pyelography with interpretation  Surgeon: Ardis Hughs, MD  Anesthesia: General  Complications: None  Intraoperative findings: left retrograde pyelography demonstrated a filling defect within the left ureter consistent with the patient's known calculus without other abnormalities.  Proximal stone at the UPJ.  EBL: Minimal  Specimens: 1. left ureteral calculus  Disposition of specimens: [facility] for stone analysis  Indication: JALAYNA JOSTEN is a 34 y.o.   patient with urolithiasis. After reviewing the management options for treatment, the patient elected to proceed with the above surgical procedure(s). We have discussed the potential benefits and risks of the procedure, side effects of the proposed treatment, the likelihood of the patient achieving the goals of the procedure, and any potential problems that might occur during the procedure or recuperation. Informed consent has been obtained.  Description of procedure:  The patient was taken to the operating room and general anesthesia was induced.  The patient was placed in the dorsal lithotomy position, prepped and draped in the usual sterile fashion, and preoperative antibiotics were administered. A preoperative time-out was performed.   Cystourethroscopy was performed.  The patient's urethra was examined and was normal. The bladder was then systematically examined in its entirety. There was no evidence for any bladder tumors, stones, or other mucosal pathology.    Attention then turned to the left ureteral orifice and a ureteral catheter was used to intubate the ureteral orifice.  Omnipaque contrast was injected through the ureteral catheter  and a retrograde pyelogram was performed with findings as dictated above.  A 0.38 sensor guidewire was then advanced up the left ureter into the renal pelvis under fluoroscopic guidance. The 6 Fr semirigid ureteroscope was then advanced into the ureter next to the guidewire and the calculus was identified.   The stone was then fragmented with the 365 micron holmium laser fiber on a setting of 0.6 and frequency of 6 Hz.   All stones were then removed from the ureter with an N-gage nitinol basket.  Reinspection of the ureter revealed no remaining visible stones or fragments.   The wire was then backloaded through the cystoscope and a ureteral stent was advance over the wire using Seldinger technique.  The stent was positioned appropriately under fluoroscopic and cystoscopic guidance.  The wire was then removed with an adequate stent curl noted in the renal pelvis as well as in the bladder.  The bladder was then emptied and the procedure ended.  The patient appeared to tolerate the procedure well and without complications.  The patient was able to be awakened and transferred to the recovery unit in satisfactory condition.   Disposition: The tether of the stent was left on and taped to the side of the patient's leg.  Instructions for removing the stent have been provided to the patient. This has been scheduled for followup in 6 weeks with a renal ultrasound.

## 2014-10-19 NOTE — Transfer of Care (Signed)
Immediate Anesthesia Transfer of Care Note  Patient: [name]  Procedure(s) Performed: Procedure(s): CYSTOSCOPY WITH STENT PLACEMENT (Left) STONE EXTRACTION WITH BASKET (Left) HOLMIUM LASER APPLICATION (Left)  Patient Location: PACU  Anesthesia Type:General  Level of Consciousness: awake, oriented, patient cooperative, lethargic and responds to stimulation  Airway & Oxygen Therapy: Patient Spontanous Breathing and Patient connected to face mask oxygen  Post-op Assessment: Report given to RN, Post -op Vital signs reviewed and stable and Patient moving all extremities  Post vital signs: Reviewed and stable  Last Vitals:  Filed Vitals:   10/19/14 2300  BP:   Pulse:   Temp: 36.6 C  Resp: 18    Complications: No apparent anesthesia complications

## 2014-10-19 NOTE — ED Notes (Signed)
Pt, being sent by PCP, c/o increasing abdominal pain and nausea x 3 days and constipation x 1 day.  Pt was seen at [facility] x 2 days ago for same.  PCP would like Pt to have a CT scan.  Sts UA showed hematuria.

## 2014-10-19 NOTE — ED Notes (Signed)
IV access and blood work attempted, unable to obtain. Another nurse to try with ultrasound.

## 2014-10-19 NOTE — ED Notes (Addendum)
Pt reports she was seen here Sunday for n/v/d. N/v relieved Sunday, still having L side abd pain. Saw pcp today and was sent here for a CT scan. LBM saturday

## 2014-10-19 NOTE — H&P (Signed)
I have been asked to see the patient by Dr. Davonna Belling, for evaluation and management of left ureteral stone with poor pain control.  History of present illness: 34 year old female who presents to the emergency department with 5 days of left flank pain, nausea, vomiting, and abdominal cramping. The patient was initially treated for gastritis. Her abdominal cramping and emesis resolved but her left flank pain persisted. The patient subsequently returned to the ER and a CT scan was obtained where she was found to have a 4 x 6 mm stone with proximal hydronephrosis located in the proximal left ureter. The patient's pain is very poorly controlled. She is crying in the emergency department and complaining of severe pain. This is despite large doses of narcotics. The pain is predominantly in the left flank and radiates across to the left abdomen. For the past 5 days she has had progressively worsening pain. She denies any fevers or chills. She denies any dysuria or progressive voiding symptoms. She denies any gross hematuria. The patient has no history of recurrent UTIs or kidney stones.  Review of systems: A 12 point comprehensive review of systems was obtained and is negative unless otherwise stated in the history of present illness.  Patient Active Problem List   Diagnosis Date Noted  . Endometrial cancer determined by uterine biopsy 01/14/2014  . Menometrorrhagia   . Amenorrhea 11/25/2013  . Obesity 09/08/2012  . HPV (human papilloma virus) infection 02/28/2012  . LSIL (low grade squamous intraepithelial lesion) on Pap smear 02/28/2012  . Hypertension 02/25/2012  . Hx of Bell's palsy 02/25/2012    No current facility-administered medications on file prior to encounter.   Current Outpatient Prescriptions on File Prior to Encounter  Medication Sig Dispense Refill  . albuterol (PROVENTIL HFA;VENTOLIN HFA) 108 (90 BASE) MCG/ACT inhaler Inhale 2 puffs into the lungs every 4 (four) hours as  needed for wheezing or shortness of breath. For cough/wheeze.    Marland Kitchen ibuprofen (ADVIL,MOTRIN) 800 MG tablet Take 1 tablet (800 mg total) by mouth every 8 (eight) hours as needed. (Patient taking differently: Take 800 mg by mouth every 8 (eight) hours as needed for headache or cramping. ) 60 tablet 1  . levonorgestrel (MIRENA) 20 MCG/24HR IUD 1 each by Intrauterine route once.    Marland Kitchen omeprazole (PRILOSEC) 20 MG capsule Take 1 capsule (20 mg total) by mouth daily. 14 capsule 0  . ondansetron (ZOFRAN) 4 MG tablet Take 1 tablet (4 mg total) by mouth every 6 (six) hours. 12 tablet 0  . oxyCODONE-acetaminophen (PERCOCET) 5-325 MG per tablet Take 1-2 tablets by mouth every 6 (six) hours as needed. 10 tablet 0  . medroxyPROGESTERone (PROVERA) 10 MG tablet Take 2 tablets (20 mg total) by mouth daily. 2 tablets every 8 hours for one week. Then 2 tablets per day (Patient not taking: Reported on 10/17/2014) 30 tablet 2  . polyethylene glycol (MIRALAX / GLYCOLAX) packet Take 17 g by mouth daily. (Patient not taking: Reported on 10/17/2014) 14 each 1    Past Medical History  Diagnosis Date  . Asthma   . Hypertension   . Hx of Bell's palsy June 2013  . Psoriasis   . Menometrorrhagia     Past Surgical History  Procedure Laterality Date  . Combined hysteroscopy diagnostic / d&c N/A 2011    Benign secretory endometrium  . Dilation and curettage of uterus  10/2003    Polyp, mild atypia, simple and complex hyperplasia,  . Dilation and curettage of uterus  11/2003  Simple and complex hyperplasia    History  Substance Use Topics  . Smoking status: Never Smoker   . Smokeless tobacco: Never Used  . Alcohol Use: No    Family History  Problem Relation Age of Onset  . Hypertension Other   . Diabetes Other   . Cancer Other     PE: Filed Vitals:   10/19/14 1349 10/19/14 1701 10/19/14 1826 10/19/14 1944  BP: 143/80 121/55 123/60 152/84  Pulse: 82 66 81 99  Temp: 98.6 F (37 C)     TempSrc: Oral      Resp: 17 18 17 22   SpO2: 100% 99% 100% 98%   Patient is crying and is in significant distress. She is morbidly obese patient is alert and oriented x3 Atraumatic normocephalic head No cervical or supraclavicular lymphadenopathy appreciated No increased work of breathing, no audible wheezes/rhonchi Regular sinus rhythm/rate Abdomen is soft, nontender, nondistended, no CVA or suprapubic tenderness Lower extremities are symmetric without appreciable edema Grossly neurologically intact No identifiable skin lesions   Recent Labs  10/17/14 1256 10/19/14 1741  WBC 7.8 5.4  HGB 9.1* 7.8*  HCT 31.3* 26.4*    Recent Labs  10/17/14 1209 10/19/14 1741  NA 137 138  K 4.9 3.5  CL 102 103  CO2 24 26  GLUCOSE 113* 80  BUN 9 6  CREATININE 0.78 1.01  CALCIUM 9.3 8.7   No results for input(s): LABPT, INR in the last 72 hours. No results for input(s): LABURIN in the last 72 hours. Results for orders placed or performed during the hospital encounter of 12/09/13  Wet prep, genital     Status: None   Collection Time: 12/10/13  2:50 AM  Result Value Ref Range Status   Yeast Wet Prep HPF POC NONE SEEN NONE SEEN Final   Trich, Wet Prep NONE SEEN NONE SEEN Final   Clue Cells Wet Prep HPF POC NONE SEEN NONE SEEN Final   WBC, Wet Prep HPF POC NONE SEEN NONE SEEN Final    Comment: FEW BACTERIA SEEN  GC/Chlamydia Probe Amp (multiple spec sources)     Status: None   Collection Time: 12/10/13  2:50 AM  Result Value Ref Range Status   CT Probe RNA NEGATIVE NEGATIVE Final   GC Probe RNA NEGATIVE NEGATIVE Final    Comment: (NOTE)                                                                                       **Normal Reference Range: Negative**      Assay performed using the Gen-Probe APTIMA COMBO2 (R) Assay. Acceptable specimen types for this assay include APTIMA Swabs (Unisex, endocervical, urethral, or vaginal), first void urine, and ThinPrep liquid based cytology samples. Performed  at [facility]: Have independently reviewed the patient's CT scan demonstrating a stone located in the left proximal ureter. There is associated hydronephrosis. There is no significant stranding.  Imp: The patient has a left proximal ureteral stone with very poorly controlled pain. The patient's pain threshold is very low. There is no evidence of infection. Her creatinine is only slightly elevated.  Recommendations: I discussed  the management options with the patient. The stone is not visible on the CT scout film ruling out shockwave lithotripsy as a viable option. We discussed ongoing medical expulsion therapy versus ureteroscopy stent placement. I offered a stent here initially. She understands that she will likely need her stone treated on a separate day, but at least stent placement would relieve her severe flank pain. We discussed the risk and benefits of the procedure in detail. Specifically we discussed stent discomfort and infection. Will plan to move forward with the stent on an urgent basis. The patient will be discharged home following this procedure.   Louis Meckel W

## 2014-10-19 NOTE — Discharge Instructions (Signed)
DISCHARGE INSTRUCTIONS FOR KIDNEY STONE/URETERAL STENT   MEDICATIONS:  1.  Resume all your other meds from home - except do not take any extra narcotic pain meds that you may have at home.  2. Flomax is to prevent stent/bladder spasms and help reduce urinary frequency. 3. Pyridium is to help with the burning/stinging when you urinate. 4. Vicodin is for moderate/severe pain, otherwise taking upto 1000mg  every 6 hours of plainTylenol will help treat your pain.  Do not take both at the same time. 5. Take Cipro one hour prior to removal of your stent.   ACTIVITY:  1. No strenuous activity x 1week  2. No driving while on narcotic pain medications  3. Drink plenty of water  4. Continue to walk at home - you can still get blood clots when you are at home, so keep active, but don't over do it.  5. May return to work/school tomorrow or when you feel ready   BATHING:  1. You can shower and we recommend daily showers  2. You have a string coming from your urethra: The stent string is attached to your ureteral stent. Do not pull on this.   SIGNS/SYMPTOMS TO CALL:  Please call us if you have a fever greater than 101.5, uncontrolled nausea/vomiting, uncontrolled pain, dizziness, unable to urinate, bloody urine, chest pain, shortness of breath, leg swelling, leg pain, redness around wound, drainage from wound, or any other concerns or questions.   You can reach [place] at [phone].   FOLLOW-UP:  1. You have an appointment in 6 weeks with a ultrasound of your kidneys prior.   2. You have a string attached to your stent, you may remove it on Monday March 28th. To do this, pull the strings until the stents are completely removed. You may feel an odd sensation in your back.

## 2014-10-19 NOTE — Anesthesia Preprocedure Evaluation (Signed)
Anesthesia Evaluation  Patient identified by MRN, date of birth, ID band Patient awake    Reviewed: Allergy & Precautions, NPO status , Patient's Chart, lab work & pertinent test results  Airway Mallampati: II  TM Distance: >3 FB Neck ROM: Full    Dental no notable dental hx.    Pulmonary neg pulmonary ROS,  breath sounds clear to auscultation  Pulmonary exam normal       Cardiovascular hypertension, Pt. on medications Rhythm:Regular Rate:Normal     Neuro/Psych negative neurological ROS  negative psych ROS   GI/Hepatic negative GI ROS, Neg liver ROS,   Endo/Other  Morbid obesity  Renal/GU negative Renal ROS  negative genitourinary   Musculoskeletal negative musculoskeletal ROS (+)   Abdominal   Peds negative pediatric ROS (+)  Hematology negative hematology ROS (+) anemia ,   Anesthesia Other Findings   Reproductive/Obstetrics negative OB ROS                             Anesthesia Physical Anesthesia Plan  ASA: II  Anesthesia Plan: General   Post-op Pain Management:    Induction: Intravenous  Airway Management Planned: LMA  Additional Equipment:   Intra-op Plan:   Post-operative Plan: Extubation in OR  Informed Consent: I have reviewed the patients History and Physical, chart, labs and discussed the procedure including the risks, benefits and alternatives for the proposed anesthesia with the patient or authorized representative who has indicated his/her understanding and acceptance.   Dental advisory given  Plan Discussed with: CRNA  Anesthesia Plan Comments:         Anesthesia Quick Evaluation

## 2014-10-19 NOTE — Anesthesia Procedure Notes (Signed)
Procedure Name: LMA Insertion Performed by: Ofilia Neas Pre-anesthesia Checklist: Patient identified, Emergency Drugs available, Suction available, Patient being monitored and Timeout performed Patient Re-evaluated:Patient Re-evaluated prior to inductionOxygen Delivery Method: Circle system utilized Preoxygenation: Pre-oxygenation with 100% oxygen Intubation Type: IV induction LMA: LMA with gastric port inserted LMA Size: 4.0 Number of attempts: 1 Placement Confirmation: positive ETCO2 Tube secured with: Tape Dental Injury: Teeth and Oropharynx as per pre-operative assessment

## 2014-10-19 NOTE — ED Notes (Signed)
I tried to collect labs and was unsuccessful and someone else attempted and was unsuccessful.  I made the nurse aware.

## 2014-10-20 ENCOUNTER — Encounter (HOSPITAL_COMMUNITY): Payer: Self-pay | Admitting: Urology

## 2014-10-23 NOTE — Anesthesia Postprocedure Evaluation (Signed)
  Anesthesia Post-op Note  Patient: Molly Archer  Procedure(s) Performed: Procedure(s) (LRB): CYSTOSCOPY WITH STENT PLACEMENT (Left) STONE EXTRACTION WITH BASKET (Left) HOLMIUM LASER APPLICATION (Left)  Patient Location: PACU  Anesthesia Type: General  Level of Consciousness: awake and alert   Airway and Oxygen Therapy: Patient Spontanous Breathing  Post-op Pain: mild  Post-op Assessment: Post-op Vital signs reviewed, Patient's Cardiovascular Status Stable, Respiratory Function Stable, Patent Airway and No signs of Nausea or vomiting  Last Vitals:  Filed Vitals:   10/20/14 0000  BP:   Pulse: 72  Temp: 36.9 C  Resp: 15    Post-op Vital Signs: stable   Complications: No apparent anesthesia complications

## 2014-10-26 ENCOUNTER — Telehealth: Payer: Self-pay | Admitting: *Deleted

## 2014-10-26 ENCOUNTER — Inpatient Hospital Stay (HOSPITAL_COMMUNITY)
Admission: AD | Admit: 2014-10-26 | Discharge: 2014-10-26 | Disposition: A | Discharge status: Home / Self Care | Payer: No Typology Code available for payment source | Source: Ambulatory Visit | Attending: Obstetrics & Gynecology | Admitting: Obstetrics & Gynecology

## 2014-10-26 ENCOUNTER — Encounter (HOSPITAL_COMMUNITY): Payer: Self-pay | Admitting: *Deleted

## 2014-10-26 DIAGNOSIS — I10 Essential (primary) hypertension: Secondary | ICD-10-CM | POA: Insufficient documentation

## 2014-10-26 DIAGNOSIS — R109 Unspecified abdominal pain: Secondary | ICD-10-CM | POA: Insufficient documentation

## 2014-10-26 DIAGNOSIS — N200 Calculus of kidney: Secondary | ICD-10-CM | POA: Diagnosis not present

## 2014-10-26 DIAGNOSIS — Z9889 Other specified postprocedural states: Secondary | ICD-10-CM | POA: Insufficient documentation

## 2014-10-26 DIAGNOSIS — T8389XA Other specified complication of genitourinary prosthetic devices, implants and grafts, initial encounter: Secondary | ICD-10-CM

## 2014-10-26 DIAGNOSIS — L409 Psoriasis, unspecified: Secondary | ICD-10-CM | POA: Insufficient documentation

## 2014-10-26 DIAGNOSIS — N939 Abnormal uterine and vaginal bleeding, unspecified: Secondary | ICD-10-CM | POA: Diagnosis not present

## 2014-10-26 DIAGNOSIS — J45909 Unspecified asthma, uncomplicated: Secondary | ICD-10-CM | POA: Insufficient documentation

## 2014-10-26 MED ORDER — KETOROLAC TROMETHAMINE 60 MG/2ML IM SOLN
60.0000 mg | Freq: Once | INTRAMUSCULAR | Status: AC
  Administered 2014-10-26: 60 mg via INTRAMUSCULAR
  Filled 2014-10-26: qty 2

## 2014-10-26 NOTE — MAU Provider Note (Signed)
CSN: [id number]     Arrival date & time 10/26/14  0244 History   None    Chief Complaint  Patient presents with  . Abdominal Pain     (Consider location/radiation/quality/duration/timing/severity/associated sxs/prior Treatment) Patient is a 34 y.o. female presenting with vaginal bleeding. The history is provided by the patient.  Vaginal Bleeding This is a new problem. The current episode started today. Associated symptoms include abdominal pain.   Molly Archer is a 34 y.o. G0P0000 with hx of menometrorrhagia presents to the MAU with pelvic pain and vaginal bleeding. She states that earlier tonight she started cramping and bleeding. She then started passing clots and had one large clot that contained her IUD. The IUD was inserted approximately 4 weeks ago. This past week the patient was also dx with a kidney stone and had a stent placed. She is taking Percocet for pain. Last dose was about 2 am. Patient brought blood clot with IUD here with her tonight.   Past Medical History  Diagnosis Date  . Asthma   . Hypertension   . Hx of Bell's palsy June 2013  . Psoriasis   . Menometrorrhagia    Past Surgical History  Procedure Laterality Date  . Combined hysteroscopy diagnostic / d&c N/A 2011    Benign secretory endometrium  . Dilation and curettage of uterus  10/2003    Polyp, mild atypia, simple and complex hyperplasia,  . Dilation and curettage of uterus  11/2003    Simple and complex hyperplasia  . Cystoscopy with stent placement Left 10/19/2014    Procedure: CYSTOSCOPY WITH STENT PLACEMENT;  Surgeon: Ardis Hughs, MD;  Location: [facility];  Service: Urology;  Laterality: Left;  . Stone extraction with basket Left 10/19/2014    Procedure: STONE EXTRACTION WITH BASKET;  Surgeon: Ardis Hughs, MD;  Location: [facility];  Service: Urology;  Laterality: Left;  . Holmium laser application Left 11/05/8117    Procedure: HOLMIUM LASER APPLICATION;  Surgeon: Ardis Hughs, MD;   Location: [facility];  Service: Urology;  Laterality: Left;   Family History  Problem Relation Age of Onset  . Hypertension Other   . Diabetes Other   . Cancer Other    History  Substance Use Topics  . Smoking status: Never Smoker   . Smokeless tobacco: Never Used  . Alcohol Use: No   OB History    Gravida Para Term Preterm AB TAB SAB Ectopic Multiple Living   0 0 0 0 0 0 0 0 0 0      Review of Systems  Gastrointestinal: Positive for abdominal pain.  Genitourinary: Positive for vaginal bleeding.  all other systems negative    Allergies  Codeine; Peanut-containing drug products; Penicillins; and Shellfish allergy  Home Medications   Prior to Admission medications   Medication Sig Start Date End Date Taking? Authorizing Provider  ciprofloxacin (CIPRO) 500 MG tablet Take 1 tablet (500 mg total) by mouth once. 10/19/14  Yes Ardis Hughs, MD  ibuprofen (ADVIL,MOTRIN) 800 MG tablet Take 1 tablet (800 mg total) by mouth every 8 (eight) hours as needed. Patient taking differently: Take 800 mg by mouth every 8 (eight) hours as needed for headache or cramping.  06/28/14  Yes Truett Mainland, DO  levonorgestrel (MIRENA) 20 MCG/24HR IUD 1 each by Intrauterine route once. 09/23/14  Yes Historical Provider, MD  omeprazole (PRILOSEC) 20 MG capsule Take 1 capsule (20 mg total) by mouth daily. 10/17/14  Yes Dahlia Bailiff, PA-C  ondansetron [facility])  4 MG tablet Take 1 tablet (4 mg total) by mouth every 6 (six) hours. 10/17/14  Yes Dahlia Bailiff, PA-C  oxyCODONE-acetaminophen (PERCOCET) 5-325 MG per tablet Take 1-2 tablets by mouth every 6 (six) hours as needed. 10/19/14  Yes Ardis Hughs, MD  phenazopyridine (PYRIDIUM) 200 MG tablet Take 1 tablet (200 mg total) by mouth 3 (three) times daily as needed for pain. 10/19/14  Yes Ardis Hughs, MD  polyethylene glycol [facility] Floria Raveling) packet Take 17 g by mouth daily. 12/10/13  Yes Manya Silvas, CNM  tamsulosin (FLOMAX) 0.4 MG CAPS capsule Take  1 capsule (0.4 mg total) by mouth daily. 10/19/14  Yes Ardis Hughs, MD  albuterol (PROVENTIL HFA;VENTOLIN HFA) 108 (90 BASE) MCG/ACT inhaler Inhale 2 puffs into the lungs every 4 (four) hours as needed for wheezing or shortness of breath. For cough/wheeze.    Historical Provider, MD  medroxyPROGESTERone (PROVERA) 10 MG tablet Take 2 tablets (20 mg total) by mouth daily. 2 tablets every 8 hours for one week. Then 2 tablets per day 09/11/14   Seabron Spates, CNM   BP 126/52 mmHg  Pulse 63  Temp(Src) 98.2 F (36.8 C) (Oral)  Resp 18  Ht 5\' 5"  (1.651 m)  Wt 327 lb (148.326 kg)  BMI 54.42 kg/m2  SpO2 99%  LMP 10/26/2014 Physical Exam  Constitutional: She is oriented to person, place, and time. No distress.  Obese   HENT:  Head: Normocephalic.  Eyes: EOM are normal.  Neck: Neck supple.  Cardiovascular: Normal rate.   Pulmonary/Chest: Effort normal.  Abdominal: Soft. There is no tenderness.  Genitourinary:  External genitalia without lesions, small blood vaginal vault. Cervix with erythema and irritation resulting from expelling the IUD. No CMT, no adnexal tenderness. Unable to palpate uterus due to patient habitus.   Musculoskeletal: Normal range of motion.  Neurological: She is alert and oriented to person, place, and time. No cranial nerve deficit.  Skin: Skin is warm and dry.  Psychiatric: She has a normal mood and affect. Her behavior is normal.  Nursing note and vitals reviewed.   ED Course  Procedures  Toradol for pain with good results.  MDM  34 y.o. female with vaginal bleeding and expelled IUD. Stable for d/c with minimal bleeding a this time and no pain. Discussed with the patient need for follow up with Dr. Ihor Dow to determine alternative to the IUD. All questioned fully answered. She will return here if any problems arise.  Final diagnoses:  IUD complication, initial encounter

## 2014-10-26 NOTE — Telephone Encounter (Addendum)
Pt left message that she was seen earlier today @ MAU due to heavy bleeding since yesterday. She had passed a very large clot and her IUD came out with the clot. She is concerned because the IUD was not for contraception but to control abnormal bleeding. She further states that she was not given other options for the bleeding. Please call back.   3/30  1030  I returned pt's call and left message requesting her to call back and leave message stating whether a detailed message may be left on her voice mail.  **Pt should be informed of clinic appt on 4/4 @ 1515. The appt was originally for string check but will now be to discuss plan of care, IUD may possibly be re-inserted. If she is having heavy bleeding, she may take Provera as previously prescribed last month (I confirmed that refill is available @ [facility]).

## 2014-10-26 NOTE — MAU Note (Signed)
Pt states she awakened at 0200 with terrible cramping, states she started her period tonight also. When she went to the restroom at 0200, and her IUD came out in a clot.

## 2014-10-26 NOTE — Discharge Instructions (Signed)
Your IUD came out with the blood clots that you passed tonight. You will need to follow up with Dr. Ihor Dow to determine an alternative. Return here as needed for problems.

## 2014-10-27 NOTE — Telephone Encounter (Signed)
Contacted patient, informed patient of appointment next week is where the provider will discuss the options for bleeding. We may replace IUD at this time.  For bleeding, patient has a prescription at [facility] for Provera and she should take as directed. Pt verbalizes understanding and has no further questions.

## 2014-11-01 ENCOUNTER — Encounter: Payer: Self-pay | Admitting: Obstetrics and Gynecology

## 2014-11-01 ENCOUNTER — Ambulatory Visit (INDEPENDENT_AMBULATORY_CARE_PROVIDER_SITE_OTHER): Payer: No Typology Code available for payment source | Admitting: Obstetrics and Gynecology

## 2014-11-01 VITALS — BP 164/82 | HR 86 | Ht 65.0 in | Wt 331.4 lb

## 2014-11-01 DIAGNOSIS — N938 Other specified abnormal uterine and vaginal bleeding: Secondary | ICD-10-CM

## 2014-11-01 LAB — POCT PREGNANCY, URINE: Preg Test, Ur: NEGATIVE

## 2014-11-01 MED ORDER — MEGESTROL ACETATE 40 MG PO TABS: 80.0000 mg | ORAL_TABLET | Freq: Two times a day (BID) | ORAL | Status: DC

## 2014-11-01 NOTE — Progress Notes (Signed)
Patient ID: Molly Archer, female   DOB: [birth year], 34 y.o.   MRN: [id number] 34 yo G0 with DUB here for follow up. She had an IUD inserted on 2/25 to help control her cycles. Patient states that it significantly helped decrease her menstrual flow. However, on 3/29, her bleeding became heavier and the IUD was expelled along with a big blood clot. She comes today requesting to have the IUD re-inserted. Patient reports good results with Megace in the past.  Past Medical History  Diagnosis Date  . Asthma   . Hypertension   . Hx of Bell's palsy June 2013  . Psoriasis   . Menometrorrhagia    Past Surgical History  Procedure Laterality Date  . Combined hysteroscopy diagnostic / d&c N/A 2011    Benign secretory endometrium  . Dilation and curettage of uterus  10/2003    Polyp, mild atypia, simple and complex hyperplasia,  . Dilation and curettage of uterus  11/2003    Simple and complex hyperplasia  . Cystoscopy with stent placement Left 10/19/2014    Procedure: CYSTOSCOPY WITH STENT PLACEMENT;  Surgeon: Ardis Hughs, MD;  Location: [facility];  Service: Urology;  Laterality: Left;  . Stone extraction with basket Left 10/19/2014    Procedure: STONE EXTRACTION WITH BASKET;  Surgeon: Ardis Hughs, MD;  Location: [facility];  Service: Urology;  Laterality: Left;  . Holmium laser application Left 7/67/3419    Procedure: HOLMIUM LASER APPLICATION;  Surgeon: Ardis Hughs, MD;  Location: [facility];  Service: Urology;  Laterality: Left;   Family History  Problem Relation Age of Onset  . Hypertension Other   . Diabetes Other   . Cancer Other    History  Substance Use Topics  . Smoking status: Never Smoker   . Smokeless tobacco: Never Used  . Alcohol Use: No   Review of system Positive for vaginal bleeding Positive for cramping  GENERAL: Well-developed, well-nourished female in no acute distress.  ABDOMEN: Soft, nontender, nondistended. No organomegaly. PELVIC: Normal external female  genitalia. Vagina is pink and rugated.  Normal discharge. Normal appearing cervix. Uterus is normal in size. No adnexal mass or tenderness. EXTREMITIES: No cyanosis, clubbing, or edema, 2+ distal pulses.  A/P 34 yo with persistent DUB following spontaneous expulsion of IUD on 3/29 - Discussed re-inserting IUD with continued megace used for 3 months in the hope of keeping endometrial lining thin enough to prevent heavy flow and re-expulsion or performing a D&C followed by IUD insertion. Patient opted for [facility] with IUD insertion. She will continue to take Megace until her procedure.

## 2014-11-08 ENCOUNTER — Ambulatory Visit (HOSPITAL_COMMUNITY): Payer: BLUE CROSS/BLUE SHIELD

## 2014-12-08 ENCOUNTER — Telehealth: Payer: Self-pay | Admitting: *Deleted

## 2014-12-08 NOTE — Telephone Encounter (Signed)
Pt contacted the clinic to discuss pain/bleeding. Pt states she has strong odor and bleeding with clots when she goes to the bathroom. Informed patient to keep appointment as scheduled.

## 2014-12-08 NOTE — Telephone Encounter (Signed)
Molly Archer left a voice message which was difficult to understand at times due to poor connection. She states she is having a lot of pelvic pain/ pressure and some bleeding when goes to restroom. States she has a procedure scheduled, with preop Friday and surgery Tuesday. Not sure? Per chart review is scheduled 12/14/14 for d&C/ reinsertion IUd for DUB  Called Molly Archer and left a message I am returning her call and will call her back tomorrow or she may call back.

## 2014-12-09 NOTE — Patient Instructions (Addendum)
   Your procedure is scheduled on:  Tuesday, May 17  Enter through the Main Entrance of [facility] at: 2:45 PM Pick up the phone at the desk and dial [phone] and inform us of your arrival.  Please call this number if you have any problems the morning of surgery: [phone]  Remember: Do not eat food after 6 AM Tuesday, day of surgery. Do not drink clear liquids after: 10 AM Tuesday, day of surgery Take these medicines the morning of surgery with a SIP OF WATER:  Omeprazole.  Bring albuterol inhaler with you on day of surgery.  Do not wear jewelry, make-up, or FINGER nail polish No metal in your hair or on your body. Do not wear lotions, powders, perfumes.  You may wear deodorant.  Do not bring valuables to the hospital. Contacts, dentures or bridgework may not be worn into surgery.  Patients discharged on the day of surgery will not be allowed to drive home.  Home with mother Elmer Sow"  cell [phone]

## 2014-12-10 ENCOUNTER — Encounter (HOSPITAL_COMMUNITY)
Admission: RE | Admit: 2014-12-10 | Discharge: 2014-12-10 | Disposition: A | Discharge status: Home / Self Care | Payer: No Typology Code available for payment source | Source: Ambulatory Visit | Attending: Obstetrics and Gynecology | Admitting: Obstetrics and Gynecology

## 2014-12-10 ENCOUNTER — Encounter (HOSPITAL_COMMUNITY): Payer: Self-pay

## 2014-12-10 DIAGNOSIS — N938 Other specified abnormal uterine and vaginal bleeding: Secondary | ICD-10-CM | POA: Diagnosis not present

## 2014-12-10 DIAGNOSIS — Z01818 Encounter for other preprocedural examination: Secondary | ICD-10-CM | POA: Insufficient documentation

## 2014-12-10 HISTORY — DX: Anemia, unspecified: D64.9

## 2014-12-10 HISTORY — DX: Reserved for inherently not codable concepts without codable children: IMO0001

## 2014-12-10 HISTORY — DX: Personal history of urinary calculi: Z87.442

## 2014-12-10 HISTORY — DX: Headache, unspecified: R51.9

## 2014-12-10 HISTORY — DX: Gastro-esophageal reflux disease without esophagitis: K21.9

## 2014-12-10 HISTORY — DX: Headache: R51

## 2014-12-10 LAB — BASIC METABOLIC PANEL
ANION GAP: 9 (ref 5–15)
BUN: 10 mg/dL (ref 6–20)
CALCIUM: 9.1 mg/dL (ref 8.9–10.3)
CHLORIDE: 107 mmol/L (ref 101–111)
CO2: 24 mmol/L (ref 22–32)
Creatinine, Ser: 0.64 mg/dL (ref 0.44–1.00)
GFR calc Af Amer: >60 mL/min (ref >60)
GFR calc non Af Amer: >60 mL/min (ref >60)
GLUCOSE: 93 mg/dL (ref 65–99)
POTASSIUM: 3.9 mmol/L (ref 3.5–5.1)
Sodium: 140 mmol/L (ref 135–145)

## 2014-12-10 LAB — CBC
HCT: 29.1 % — ABNORMAL LOW (ref 36.0–46.0)
Hemoglobin: 8.4 g/dL — ABNORMAL LOW (ref 12.0–15.0)
MCH: 20.7 pg — AB (ref 26.0–34.0)
MCHC: 28.9 g/dL — AB (ref 30.0–36.0)
MCV: 71.7 fL — AB (ref 78.0–100.0)
Platelets: 428 10*3/uL — ABNORMAL HIGH (ref 150–400)
RBC: 4.06 MIL/uL (ref 3.87–5.11)
RDW: 15 % (ref 11.5–15.5)
WBC: 7 10*3/uL (ref 4.0–10.5)

## 2014-12-14 ENCOUNTER — Ambulatory Visit (HOSPITAL_COMMUNITY): Payer: No Typology Code available for payment source | Admitting: Anesthesiology

## 2014-12-14 ENCOUNTER — Ambulatory Visit (HOSPITAL_COMMUNITY)
Admission: RE | Admit: 2014-12-14 | Discharge: 2014-12-14 | Disposition: A | Discharge status: Home / Self Care | Payer: No Typology Code available for payment source | Source: Ambulatory Visit | Attending: Obstetrics and Gynecology | Admitting: Obstetrics and Gynecology

## 2014-12-14 ENCOUNTER — Encounter (HOSPITAL_COMMUNITY)
Admission: RE | Disposition: A | Discharge status: Home / Self Care | Payer: Self-pay | Source: Ambulatory Visit | Attending: Obstetrics and Gynecology

## 2014-12-14 ENCOUNTER — Encounter (HOSPITAL_COMMUNITY): Payer: Self-pay | Admitting: Anesthesiology

## 2014-12-14 DIAGNOSIS — Z87442 Personal history of urinary calculi: Secondary | ICD-10-CM | POA: Diagnosis not present

## 2014-12-14 DIAGNOSIS — Z880 Allergy status to penicillin: Secondary | ICD-10-CM | POA: Insufficient documentation

## 2014-12-14 DIAGNOSIS — N938 Other specified abnormal uterine and vaginal bleeding: Secondary | ICD-10-CM

## 2014-12-14 DIAGNOSIS — Z91013 Allergy to seafood: Secondary | ICD-10-CM | POA: Insufficient documentation

## 2014-12-14 DIAGNOSIS — Z886 Allergy status to analgesic agent status: Secondary | ICD-10-CM | POA: Diagnosis not present

## 2014-12-14 DIAGNOSIS — J45909 Unspecified asthma, uncomplicated: Secondary | ICD-10-CM | POA: Diagnosis not present

## 2014-12-14 DIAGNOSIS — Z6841 Body Mass Index (BMI) 40.0 and over, adult: Secondary | ICD-10-CM | POA: Diagnosis not present

## 2014-12-14 DIAGNOSIS — Z30430 Encounter for insertion of intrauterine contraceptive device: Secondary | ICD-10-CM | POA: Insufficient documentation

## 2014-12-14 DIAGNOSIS — D649 Anemia, unspecified: Secondary | ICD-10-CM | POA: Diagnosis not present

## 2014-12-14 DIAGNOSIS — K219 Gastro-esophageal reflux disease without esophagitis: Secondary | ICD-10-CM | POA: Insufficient documentation

## 2014-12-14 DIAGNOSIS — I10 Essential (primary) hypertension: Secondary | ICD-10-CM | POA: Insufficient documentation

## 2014-12-14 DIAGNOSIS — Z91010 Allergy to peanuts: Secondary | ICD-10-CM | POA: Diagnosis not present

## 2014-12-14 HISTORY — PX: DILATION AND CURETTAGE OF UTERUS: SHX78

## 2014-12-14 HISTORY — PX: INTRAUTERINE DEVICE (IUD) INSERTION: SHX5877

## 2014-12-14 LAB — PREGNANCY, URINE: Preg Test, Ur: NEGATIVE

## 2014-12-14 SURGERY — INSERTION, INTRAUTERINE DEVICE
Anesthesia: General | Site: Vagina

## 2014-12-14 MED ORDER — LIDOCAINE HCL (CARDIAC) 20 MG/ML IV SOLN
INTRAVENOUS | Status: DC | PRN
  Administered 2014-12-14: 80 mg via INTRAVENOUS

## 2014-12-14 MED ORDER — KETOROLAC TROMETHAMINE 30 MG/ML IJ SOLN
INTRAMUSCULAR | Status: DC | PRN
  Administered 2014-12-14: 30 mg via INTRAVENOUS

## 2014-12-14 MED ORDER — LACTATED RINGERS IV SOLN: INTRAVENOUS | Status: DC

## 2014-12-14 MED ORDER — ALBUTEROL SULFATE HFA 108 (90 BASE) MCG/ACT IN AERS
INHALATION_SPRAY | RESPIRATORY_TRACT | Status: DC | PRN
  Administered 2014-12-14: 2 via RESPIRATORY_TRACT

## 2014-12-14 MED ORDER — SCOPOLAMINE 1 MG/3DAYS TD PT72
1.0000 | MEDICATED_PATCH | Freq: Once | TRANSDERMAL | Status: DC
  Administered 2014-12-14: 1.5 mg via TRANSDERMAL

## 2014-12-14 MED ORDER — ONDANSETRON HCL 4 MG/2ML IJ SOLN
INTRAMUSCULAR | Status: AC
  Filled 2014-12-14: qty 2

## 2014-12-14 MED ORDER — PROPOFOL 10 MG/ML IV BOLUS
INTRAVENOUS | Status: AC
  Filled 2014-12-14: qty 20

## 2014-12-14 MED ORDER — MEPERIDINE HCL 25 MG/ML IJ SOLN: 6.2500 mg | INTRAMUSCULAR | Status: DC | PRN

## 2014-12-14 MED ORDER — FENTANYL CITRATE (PF) 250 MCG/5ML IJ SOLN
INTRAMUSCULAR | Status: AC
  Filled 2014-12-14: qty 5

## 2014-12-14 MED ORDER — DOCUSATE SODIUM 100 MG PO CAPS: 100.0000 mg | ORAL_CAPSULE | Freq: Two times a day (BID) | ORAL | Status: DC | PRN

## 2014-12-14 MED ORDER — LACTATED RINGERS IV SOLN
INTRAVENOUS | Status: DC
  Administered 2014-12-14 (×2): via INTRAVENOUS

## 2014-12-14 MED ORDER — MIDAZOLAM HCL 2 MG/2ML IJ SOLN
INTRAMUSCULAR | Status: AC
  Filled 2014-12-14: qty 2

## 2014-12-14 MED ORDER — ONDANSETRON HCL 4 MG/2ML IJ SOLN
INTRAMUSCULAR | Status: DC | PRN
Start: 2014-12-14 — End: 2014-12-14
  Administered 2014-12-14: 4 mg via INTRAVENOUS

## 2014-12-14 MED ORDER — PROPOFOL 10 MG/ML IV BOLUS
INTRAVENOUS | Status: DC | PRN
  Administered 2014-12-14: 300 mg via INTRAVENOUS

## 2014-12-14 MED ORDER — DEXAMETHASONE SODIUM PHOSPHATE 4 MG/ML IJ SOLN
INTRAMUSCULAR | Status: DC | PRN
  Administered 2014-12-14: 4 mg via INTRAVENOUS

## 2014-12-14 MED ORDER — OXYCODONE-ACETAMINOPHEN 5-325 MG PO TABS: 1.0000 | ORAL_TABLET | ORAL | Status: DC | PRN

## 2014-12-14 MED ORDER — ONDANSETRON HCL 4 MG/2ML IJ SOLN: 4.0000 mg | Freq: Once | INTRAMUSCULAR | Status: DC | PRN

## 2014-12-14 MED ORDER — FENTANYL CITRATE (PF) 100 MCG/2ML IJ SOLN: 25.0000 ug | INTRAMUSCULAR | Status: DC | PRN

## 2014-12-14 MED ORDER — MIDAZOLAM HCL 2 MG/2ML IJ SOLN
INTRAMUSCULAR | Status: DC | PRN
  Administered 2014-12-14: 1 mg via INTRAVENOUS

## 2014-12-14 MED ORDER — CHLOROPROCAINE HCL 1 % IJ SOLN
INTRAMUSCULAR | Status: AC
  Filled 2014-12-14: qty 30

## 2014-12-14 MED ORDER — KETOROLAC TROMETHAMINE 30 MG/ML IJ SOLN
30.0000 mg | Freq: Once | INTRAMUSCULAR | Status: DC | PRN
Start: 2014-12-14 — End: 2014-12-14

## 2014-12-14 MED ORDER — DEXAMETHASONE SODIUM PHOSPHATE 10 MG/ML IJ SOLN
INTRAMUSCULAR | Status: AC
  Filled 2014-12-14: qty 1

## 2014-12-14 MED ORDER — SCOPOLAMINE 1 MG/3DAYS TD PT72
MEDICATED_PATCH | TRANSDERMAL | Status: AC
  Administered 2014-12-14: 1.5 mg via TRANSDERMAL
  Filled 2014-12-14: qty 1

## 2014-12-14 MED ORDER — CHLOROPROCAINE HCL 1 % IJ SOLN
INTRAMUSCULAR | Status: DC | PRN
  Administered 2014-12-14: 6 mL

## 2014-12-14 MED ORDER — FENTANYL CITRATE (PF) 250 MCG/5ML IJ SOLN
INTRAMUSCULAR | Status: DC | PRN
  Administered 2014-12-14 (×3): 50 ug via INTRAVENOUS

## 2014-12-14 MED ORDER — LIDOCAINE HCL (CARDIAC) 20 MG/ML IV SOLN
INTRAVENOUS | Status: AC
  Filled 2014-12-14: qty 5

## 2014-12-14 SURGICAL SUPPLY — 12 items
CATH ROBINSON RED A/P 16FR (CATHETERS) ×3 IMPLANT
CONTAINER PREFILL 10% NBF 60ML (FORM) ×5 IMPLANT
ELECTRODE RT ANGLE VERSAPOINT (CUTTING LOOP) IMPLANT
GLOVE BIOGEL PI IND STRL 6.5 (GLOVE) ×2 IMPLANT
GLOVE BIOGEL PI INDICATOR 6.5 (GLOVE) ×1
GLOVE SURG SS PI 6.0 STRL IVOR (GLOVE) ×3 IMPLANT
GLOVE SURG SS PI 7.0 STRL IVOR (GLOVE) ×4 IMPLANT
GOWN STRL REUS W/TWL LRG LVL3 (GOWN DISPOSABLE) ×6 IMPLANT
PACK VAGINAL MINOR WOMEN LF (CUSTOM PROCEDURE TRAY) ×3 IMPLANT
PAD OB MATERNITY 4.3X12.25 (PERSONAL CARE ITEMS) ×3 IMPLANT
TOWEL OR 17X24 6PK STRL BLUE (TOWEL DISPOSABLE) ×6 IMPLANT
WATER STERILE IRR 1000ML POUR (IV SOLUTION) ×3 IMPLANT

## 2014-12-14 NOTE — Anesthesia Preprocedure Evaluation (Signed)
Anesthesia Evaluation  Patient identified by MRN, date of birth, ID band Patient awake    Reviewed: Allergy & Precautions, NPO status , Patient's Chart, lab work & pertinent test results  Airway Mallampati: II  TM Distance: >3 FB Neck ROM: Full    Dental no notable dental hx.    Pulmonary    Pulmonary exam normal       Cardiovascular hypertension, Pt. on medications Normal cardiovascular exam    Neuro/Psych negative psych ROS   GI/Hepatic Neg liver ROS,   Endo/Other  Morbid obesity  Renal/GU negative Renal ROS  negative genitourinary   Musculoskeletal negative musculoskeletal ROS (+)   Abdominal (+) + obese,   Peds negative pediatric ROS (+)  Hematology negative hematology ROS (+) anemia ,   Anesthesia Other Findings   Reproductive/Obstetrics negative OB ROS                             Anesthesia Physical  Anesthesia Plan  ASA: III  Anesthesia Plan: General   Post-op Pain Management:    Induction: Intravenous  Airway Management Planned: LMA  Additional Equipment:   Intra-op Plan:   Post-operative Plan:   Informed Consent: I have reviewed the patients History and Physical, chart, labs and discussed the procedure including the risks, benefits and alternatives for the proposed anesthesia with the patient or authorized representative who has indicated his/her understanding and acceptance.     Plan Discussed with: CRNA and Surgeon  Anesthesia Plan Comments:         Anesthesia Quick Evaluation

## 2014-12-14 NOTE — H&P (Signed)
Molly Archer is an 34 y.o. female G0 with DUB medically managed with megace and provera presenting today for scheduled D&C and IUD insertion. Patient had an IUD placed in February which helped in controlling her bleeding but was spontaneously expelled when she passed a large blood clot. She describes her cycles as occuryring monthly, heavy with passage of large clots.   Pertinent Gynecological History: Contraception: none DES exposure: denies Blood transfusions: none Sexually transmitted diseases: no past history Previous GYN Procedures: DNC  Last mammogram: n/a Last pap: normal Date: 09/2013 OB History: G0   Menstrual History:  No LMP recorded.    Past Medical History  Diagnosis Date  . Hx of Bell's palsy June 2013  . Psoriasis   . Menometrorrhagia   . Hypertension     currently no meds  . Asthma     only uses inhaler in fall and spring  . Shortness of breath dyspnea     with exercise/exertion  . History of kidney stones   . GERD (gastroesophageal reflux disease)     diet controlled, occ. uses omeprazole  . Headache     otc med prn  . Anemia     Past Surgical History  Procedure Laterality Date  . Combined hysteroscopy diagnostic / d&c N/A 2011    Benign secretory endometrium  . Cystoscopy with stent placement Left 10/19/2014    Procedure: CYSTOSCOPY WITH STENT PLACEMENT;  Surgeon: Ardis Hughs, MD;  Location: [facility];  Service: Urology;  Laterality: Left;  . Stone extraction with basket Left 10/19/2014    Procedure: STONE EXTRACTION WITH BASKET;  Surgeon: Ardis Hughs, MD;  Location: [facility];  Service: Urology;  Laterality: Left;  . Holmium laser application Left 8/84/1660    Procedure: HOLMIUM LASER APPLICATION;  Surgeon: Ardis Hughs, MD;  Location: [facility];  Service: Urology;  Laterality: Left;  . Dilation and curettage of uterus  10/2003    Polyp, mild atypia, simple and complex hyperplasia,  . Dilation and curettage of uterus  11/2003    Simple  and complex hyperplasia  . Dilation and curettage of uterus  2011  . Wisdom tooth extraction      Family History  Problem Relation Age of Onset  . Hypertension Other   . Diabetes Other   . Cancer Other     Social History:  reports that she has never smoked. She has never used smokeless tobacco. She reports that she does not drink alcohol or use illicit drugs.  Allergies:  Allergies  Allergen Reactions  . Codeine Itching  . Peanut-Containing Drug Products Itching  . Penicillins Hives  . Shellfish Allergy Itching    Prescriptions prior to admission  Medication Sig Dispense Refill Last Dose  . bismuth subsalicylate (PEPTO BISMOL) 262 MG/15ML suspension Take 30 mLs by mouth every 6 (six) hours as needed for indigestion.     Marland Kitchen albuterol (PROVENTIL HFA;VENTOLIN HFA) 108 (90 BASE) MCG/ACT inhaler Inhale 2 puffs into the lungs every 4 (four) hours as needed for wheezing or shortness of breath. For cough/wheeze.   Taking  . ciprofloxacin (CIPRO) 500 MG tablet Take 1 tablet (500 mg total) by mouth once. (Patient not taking: Reported on 11/01/2014) 1 tablet 0 Completed Course at Unknown time  . ibuprofen (ADVIL,MOTRIN) 800 MG tablet Take 1 tablet (800 mg total) by mouth every 8 (eight) hours as needed. (Patient taking differently: Take 800 mg by mouth every 8 (eight) hours as needed for headache or cramping. ) 60 tablet  1 Taking  . medroxyPROGESTERone (PROVERA) 10 MG tablet Take 2 tablets (20 mg total) by mouth daily. 2 tablets every 8 hours for one week. Then 2 tablets per day (Patient not taking: Reported on 11/01/2014) 30 tablet 2 Not Taking at Unknown time  . megestrol (MEGACE) 40 MG tablet Take 2 tablets (80 mg total) by mouth 2 (two) times daily. 120 tablet 3   . omeprazole (PRILOSEC) 20 MG capsule Take 1 capsule (20 mg total) by mouth daily. (Patient not taking: Reported on 11/01/2014) 14 capsule 0 Not Taking at Unknown time  . ondansetron (ZOFRAN) 4 MG tablet Take 1 tablet (4 mg total) by  mouth every 6 (six) hours. (Patient not taking: Reported on 11/01/2014) 12 tablet 0 Not Taking at Unknown time  . oxyCODONE-acetaminophen (PERCOCET) 5-325 MG per tablet Take 1-2 tablets by mouth every 6 (six) hours as needed. 15 tablet 0 Taking  . phenazopyridine (PYRIDIUM) 200 MG tablet Take 1 tablet (200 mg total) by mouth 3 (three) times daily as needed for pain. (Patient not taking: Reported on 11/01/2014) 10 tablet 0 Not Taking at Unknown time  . polyethylene glycol (MIRALAX / GLYCOLAX) packet Take 17 g by mouth daily. (Patient not taking: Reported on 11/01/2014) 14 each 1 Not Taking at Unknown time  . tamsulosin (FLOMAX) 0.4 MG CAPS capsule Take 1 capsule (0.4 mg total) by mouth daily. (Patient not taking: Reported on 11/01/2014) 7 capsule 0 Not Taking at Unknown time    ROS  See pertinent in HPI  Blood pressure 143/77, pulse 77, temperature 98.4 F (36.9 C), temperature source Oral, height 5\' 5"  (1.651 m), weight 327 lb (148.326 kg), SpO2 100 %. Physical Exam GENERAL: Well-developed, well-nourished female in no acute distress. Obese HEENT: Normocephalic, atraumatic. Sclerae anicteric.  NECK: Supple. Normal thyroid.  LUNGS: Clear to auscultation bilaterally.  HEART: Regular rate and rhythm. ABDOMEN: Soft, nontender, nondistended. Obese PELVIC: Deferred to OR EXTREMITIES: No cyanosis, clubbing, or edema, 2+ distal pulses.  No results found for this or any previous visit (from the past 24 hour(s)).  No results found.   09/11/2014 ultrasound FINDINGS: Uterus  Measurements: 10.3 x 6.0 x 5.8 cm. No fibroids or other mass visualized.  Endometrium  Thickness: 27 mm. No focal abnormality visualized.  Right ovary  Measurements: 3.1 x 2.1 x 2.9 cm. Normal appearance/no adnexal mass.  Left ovary  Not well visualized.  Other findings  Small amount of free fluid in the pelvis.  IMPRESSION: The endometrium is markedly thickened and heterogeneous in echogenicity.  Differential considerations include hyperplasia, neoplasm or polyps. If bleeding remains unresponsive to hormonal or medical therapy, focal lesion work-up with sonohysterogram should be considered. Endometrial biopsy should also be considered in pre-menopausal patients at high risk for endometrial carcinoma. (Ref: Radiological Reasoning: Algorithmic Workup of Abnormal Vaginal Bleeding with Endovaginal Sonography and Sonohysterography. AJR 2008; 308:M57-84)   12/2013 [facility] Endometrium, biopsy - POLYPOID FRAGMENT OF BENIGN INACTIVE ENDOMETRIUM WITH PSEUDODECIDUALIZED STROMA, CONSISTENT WITH EXOGENOUS HORMONAL EFFECT. - NO EVIDENCE OF ATYPIA, HYPERPLASIA OR MALIGNANCY.  Assessment/Plan: 34 yo G0 with DUB here for D&C and IUD insertion - Risks, benefits and alternatives were explained including but not limited to risks of bleeding, infection, uterine perforation and damage to adjacent organs - Patient verbalized understanding and all questions were answered  Ronal Maybury 12/14/2014, 3:29 PM

## 2014-12-14 NOTE — Transfer of Care (Signed)
Immediate Anesthesia Transfer of Care Note  Patient: Molly Archer  Procedure(s) Performed: Procedure(s): INTRAUTERINE DEVICE (IUD) INSERTION (N/A) DILATATION AND CURETTAGE (N/A)  Patient Location: PACU  Anesthesia Type:General  Level of Consciousness: awake, alert  and oriented  Airway & Oxygen Therapy: Patient Spontanous Breathing and Patient connected to nasal cannula oxygen  Post-op Assessment: Report given to RN and Post -op Vital signs reviewed and stable  Post vital signs: Reviewed and stable  Last Vitals:  Filed Vitals:   12/14/14 1653  BP: 136/68  Pulse: 94  Temp: 36.8 C  Resp: 16    Complications: No apparent anesthesia complications

## 2014-12-14 NOTE — Op Note (Signed)
PROCEDURE DATE: 12/14/2014  PREOPERATIVE DIAGNOSIS: DUB. POSTOPERATIVE DIAGNOSIS: The same. PROCEDURE:     Dilation and Curretage with IUD insertion. SURGEON:  Dr. Mora Bellman M.D.  INDICATIONS: 34 y.o. yo G0P0000 with long standing history of DUB here for D&C with IUD insertion.  Risks of surgery were discussed with the patient including but not limited to: bleeding which may require transfusion; infection which may require antibiotics; injury to uterus or surrounding organs;need for additional procedures including laparotomy or laparoscopy; possibility of intrauterine scarring which may impair future fertility; and other postoperative/anesthesia complications. Written informed consent was obtained.    FINDINGS:  A 10 size anteverted uterus, moderate amounts of endometrial sampling, specimen sent to pathology.  ANESTHESIA:    Monitored intravenous sedation, paracervical block. INTRAVENOUS FLUIDS:  1000 ml of LR ESTIMATED BLOOD LOSS:  Less than 20 ml. SPECIMENS:  Endometrial sampling sent to pathology COMPLICATIONS:  None immediate.  PROCEDURE DETAILS:  The patient received intravenous antibiotics while in the preoperative area.  She was then taken to the operating room where general anesthesia was administered and was found to be adequate.  After an adequate timeout was performed, she was placed in the dorsal lithotomy position and examined; then prepped and draped in the sterile manner.   Her bladder was catheterized for an unmeasured amount of clear, yellow urine. A vaginal speculum was then placed in the patient's vagina and a single tooth tenaculum was applied to the anterior lip of the cervix.  A paracervical block using 1% Marcaine was administered. The cervix was gently dilated through serial insertion of Hagar dilators to accommodate a sime 1 sims curette that was gently advanced to the uterine fundus.  A sharp curettage was then performed until a gritty texture was felt.There was minimal  bleeding noted. The IUD was then inserted according to the manufactures guidelines. The tenaculum removed with good hemostasis noted.  The patient tolerated the procedure well.  The patient was taken to the recovery area in stable condition.

## 2014-12-14 NOTE — Anesthesia Procedure Notes (Signed)
Procedure Name: LMA Insertion Date/Time: 12/14/2014 4:20 PM Performed by: Brock Ra Pre-anesthesia Checklist: Emergency Drugs available, Patient identified, Timeout performed, Suction available and Patient being monitored Patient Re-evaluated:Patient Re-evaluated prior to inductionOxygen Delivery Method: Circle system utilized Preoxygenation: Pre-oxygenation with 100% oxygen Intubation Type: IV induction LMA: LMA inserted LMA Size: 4.0 Number of attempts: 1 Airway Equipment and Method: Patient positioned with wedge pillow Placement Confirmation: positive ETCO2 and breath sounds checked- equal and bilateral Tube secured with: Tape Dental Injury: Teeth and Oropharynx as per pre-operative assessment

## 2014-12-14 NOTE — Anesthesia Postprocedure Evaluation (Signed)
Anesthesia Post Note  Patient: Molly Archer  Procedure(s) Performed: Procedure(s) (LRB): INTRAUTERINE DEVICE (IUD) INSERTION (N/A) DILATATION AND CURETTAGE (N/A)  Anesthesia type: General  Patient location: PACU  Post pain: Pain level controlled  Post assessment: Post-op Vital signs reviewed  Last Vitals:  Filed Vitals:   12/14/14 1715  BP: 125/67  Pulse: 66  Temp:   Resp: 15    Post vital signs: Reviewed  Level of consciousness: sedated  Complications: No apparent anesthesia complications

## 2014-12-14 NOTE — Discharge Instructions (Signed)
Levonorgestrel intrauterine device (IUD) What is this medicine? LEVONORGESTREL IUD (LEE voe nor jes trel) is a contraceptive (birth control) device. The device is placed inside the uterus by a healthcare professional. It is used to prevent pregnancy and can also be used to treat heavy bleeding that occurs during your period. Depending on the device, it can be used for 3 to 5 years. This medicine may be used for other purposes; ask your health care provider or pharmacist if you have questions. COMMON BRAND NAME(S): Verda Cumins What should I tell my health care provider before I take this medicine? They need to know if you have any of these conditions: -abnormal Pap smear -cancer of the breast, uterus, or cervix -diabetes -endometritis -genital or pelvic infection now or in the past -have more than one sexual partner or your partner has more than one partner -heart disease -history of an ectopic or tubal pregnancy -immune system problems -IUD in place -liver disease or tumor -problems with blood clots or take blood-thinners -use intravenous drugs -uterus of unusual shape -vaginal bleeding that has not been explained -an unusual or allergic reaction to levonorgestrel, other hormones, silicone, or polyethylene, medicines, foods, dyes, or preservatives -pregnant or trying to get pregnant -breast-feeding How should I use this medicine? This device is placed inside the uterus by a health care professional. Talk to your pediatrician regarding the use of this medicine in children. Special care may be needed. Overdosage: If you think you have taken too much of this medicine contact a poison control center or emergency room at once. NOTE: This medicine is only for you. Do not share this medicine with others. What if I miss a dose? This does not apply. What may interact with this medicine? Do not take this medicine with any of the following  medications: -amprenavir -bosentan -fosamprenavir This medicine may also interact with the following medications: -aprepitant -barbiturate medicines for inducing sleep or treating seizures -bexarotene -griseofulvin -medicines to treat seizures like carbamazepine, ethotoin, felbamate, oxcarbazepine, phenytoin, topiramate -modafinil -pioglitazone -rifabutin -rifampin -rifapentine -some medicines to treat HIV infection like atazanavir, indinavir, lopinavir, nelfinavir, tipranavir, ritonavir -St. John's wort -warfarin This list may not describe all possible interactions. Give your health care provider a list of all the medicines, herbs, non-prescription drugs, or dietary supplements you use. Also tell them if you smoke, drink alcohol, or use illegal drugs. Some items may interact with your medicine. What should I watch for while using this medicine? Visit your doctor or health care professional for regular check ups. See your doctor if you or your partner has sexual contact with others, becomes HIV positive, or gets a sexual transmitted disease. This product does not protect you against HIV infection (AIDS) or other sexually transmitted diseases. You can check the placement of the IUD yourself by reaching up to the top of your vagina with clean fingers to feel the threads. Do not pull on the threads. It is a good habit to check placement after each menstrual period. Call your doctor right away if you feel more of the IUD than just the threads or if you cannot feel the threads at all. The IUD may come out by itself. You may become pregnant if the device comes out. If you notice that the IUD has come out use a backup birth control method like condoms and call your health care provider. Using tampons will not change the position of the IUD and are okay to use during your period. What side effects may  I notice from receiving this medicine? Side effects that you should report to your doctor or  health care professional as soon as possible: -allergic reactions like skin rash, itching or hives, swelling of the face, lips, or tongue -fever, flu-like symptoms -genital sores -high blood pressure -no menstrual period for 6 weeks during use -pain, swelling, warmth in the leg -pelvic pain or tenderness -severe or sudden headache -signs of pregnancy -stomach cramping -sudden shortness of breath -trouble with balance, talking, or walking -unusual vaginal bleeding, discharge -yellowing of the eyes or skin Side effects that usually do not require medical attention (report to your doctor or health care professional if they continue or are bothersome): -acne -breast pain -change in sex drive or performance -changes in weight -cramping, dizziness, or faintness while the device is being inserted -headache -irregular menstrual bleeding within first 3 to 6 months of use -nausea This list may not describe all possible side effects. Call your doctor for medical advice about side effects. You may report side effects to FDA at 1-800-FDA-1088. Where should I keep my medicine? This does not apply. NOTE: This sheet is a summary. It may not cover all possible information. If you have questions about this medicine, talk to your doctor, pharmacist, or health care provider.  2015, Elsevier/Gold Standard. (2011-08-16 13:54:04)   Dilation and Curettage or Vacuum Curettage, Care After Refer to this sheet in the next few weeks. These instructions provide you with information on caring for yourself after your procedure. Your health care provider may also give you more specific instructions. Your treatment has been planned according to current medical practices, but problems sometimes occur. Call your health care provider if you have any problems or questions after your procedure. WHAT TO EXPECT AFTER THE PROCEDURE After your procedure, it is typical to have light cramping and bleeding. This may last for 2  days to 2 weeks after the procedure. HOME CARE INSTRUCTIONS   Do not drive for 24 hours.  Wait 1 week before returning to strenuous activities.  Take your temperature 2 times a day for 4 days and write it down. Provide these temperatures to your health care provider if you develop a fever.  Avoid long periods of standing.  Avoid heavy lifting, pushing, or pulling. Do not lift anything heavier than 10 pounds (4.5 kg).  Limit stair climbing to once or twice a day.  Take rest periods often.  You may resume your usual diet.  Drink enough fluids to keep your urine clear or pale yellow.  Your usual bowel function should return. If you have constipation, you may:  Take a mild laxative with permission from your health care provider.  Add fruit and bran to your diet.  Drink more fluids.  Take showers instead of baths until your health care provider gives you permission to take baths.  Do not go swimming or use a hot tub until your health care provider approves.  Try to have someone with you or available to you the first 24-48 hours, especially if you were given a general anesthetic.  Do not douche, use tampons, or have sex (intercourse) for 2 weeks after the procedure.  Only take over-the-counter or prescription medicines as directed by your health care provider. Do not take aspirin. It can cause bleeding.  Follow up with your health care provider as directed. SEEK MEDICAL CARE IF:   You have increasing cramps or pain that is not relieved with medicine.  You have abdominal pain that does not seem  to be related to the same area of earlier cramping and pain.  You have bad smelling vaginal discharge.  You have a rash.  You are having problems with any medicine. SEEK IMMEDIATE MEDICAL CARE IF:   You have bleeding that is heavier than a normal menstrual period.  You have a fever.  You have chest pain.  You have shortness of breath.  You feel dizzy or feel like  fainting.  You pass out.  You have pain in your shoulder strap area.  You have heavy vaginal bleeding with or without blood clots. MAKE SURE YOU:   Understand these instructions.  Will watch your condition.  Will get help right away if you are not doing well or get worse. Document Released: 07/13/2000 Document Revised: 07/21/2013 Document Reviewed: 02/12/2013 [facility] Patient Information 2015 [place], [place]. This information is not intended to replace advice given to you by your health care provider. Make sure you discuss any questions you have with your health care provider.   No Ibuprofen containing products (ie Advil, Aleve, Motrin, etc.) until after 10:30 pm tonight.

## 2014-12-15 ENCOUNTER — Encounter (HOSPITAL_COMMUNITY): Payer: Self-pay | Admitting: Obstetrics and Gynecology

## 2014-12-15 ENCOUNTER — Other Ambulatory Visit: Payer: Self-pay | Admitting: Obstetrics and Gynecology

## 2014-12-15 ENCOUNTER — Encounter: Payer: Self-pay | Admitting: General Practice

## 2014-12-15 ENCOUNTER — Telehealth: Payer: Self-pay | Admitting: General Practice

## 2014-12-15 MED ORDER — IBUPROFEN 600 MG PO TABS: 600.0000 mg | ORAL_TABLET | Freq: Four times a day (QID) | ORAL | Status: DC | PRN

## 2014-12-15 NOTE — Telephone Encounter (Signed)
Patient called and left message stating she had a d/c and mirena inserted last night. States she was supposed to get a Rx for ibuprofen and it didn't go to her pharmacy. Also she needs a note for work as well. Spoke to Dr Elly Modena who sent ibuprofen to pharmacy and also stated patient can have up to two weeks off work. Called patient and informed her of Rx sent and that a letter will be waiting for her at the front office for her to pick up. Told patient the note is for two weeks away from work. Patient verbalized understanding to all and had no other questions

## 2014-12-20 ENCOUNTER — Encounter: Payer: Self-pay | Admitting: Obstetrics and Gynecology

## 2014-12-21 ENCOUNTER — Telehealth: Payer: Self-pay | Admitting: General Practice

## 2014-12-21 NOTE — Telephone Encounter (Signed)
Tried to call patient in regards to [employer]. No answer. Left message stating we are trying to reach you in regards to your mychart message, please go to [facility] for further evaluation. You may call us back at the clinics if you have any questions.

## 2015-01-06 ENCOUNTER — Ambulatory Visit (INDEPENDENT_AMBULATORY_CARE_PROVIDER_SITE_OTHER): Payer: No Typology Code available for payment source | Admitting: Obstetrics and Gynecology

## 2015-01-06 ENCOUNTER — Encounter: Payer: Self-pay | Admitting: Obstetrics and Gynecology

## 2015-01-06 VITALS — BP 158/84 | HR 84 | Temp 98.8°F | Ht 65.0 in | Wt 320.6 lb

## 2015-01-06 DIAGNOSIS — Z9889 Other specified postprocedural states: Secondary | ICD-10-CM

## 2015-01-06 DIAGNOSIS — Z30431 Encounter for routine checking of intrauterine contraceptive device: Secondary | ICD-10-CM

## 2015-01-06 NOTE — Progress Notes (Signed)
Patient ID: Molly Archer, female   DOB: [birth year], 34 y.o.   MRN: [id number] 34 yo G0P0 here for post op check. Patient underwent a D&C with IUD insertion on 12/14/2014 secondary to a long standing history of DUB. Patient reports feeling well since the procedure. She denies any vaginal bleeding. She has experienced some cramping abdominal pain. She denies any abnormal discharge. Thus far, she is content with the results  Past Medical History  Diagnosis Date  . Hx of Bell's palsy June 2013  . Psoriasis   . Menometrorrhagia   . Hypertension     currently no meds  . Asthma     only uses inhaler in fall and spring  . Shortness of breath dyspnea     with exercise/exertion  . History of kidney stones   . GERD (gastroesophageal reflux disease)     diet controlled, occ. uses omeprazole  . Headache     otc med prn  . Anemia    Past Surgical History  Procedure Laterality Date  . Combined hysteroscopy diagnostic / d&c N/A 2011    Benign secretory endometrium  . Cystoscopy with stent placement Left 10/19/2014    Procedure: CYSTOSCOPY WITH STENT PLACEMENT;  Surgeon: Ardis Hughs, MD;  Location: [facility];  Service: Urology;  Laterality: Left;  . Stone extraction with basket Left 10/19/2014    Procedure: STONE EXTRACTION WITH BASKET;  Surgeon: Ardis Hughs, MD;  Location: [facility];  Service: Urology;  Laterality: Left;  . Holmium laser application Left 12/05/3265    Procedure: HOLMIUM LASER APPLICATION;  Surgeon: Ardis Hughs, MD;  Location: [facility];  Service: Urology;  Laterality: Left;  . Dilation and curettage of uterus  10/2003    Polyp, mild atypia, simple and complex hyperplasia,  . Dilation and curettage of uterus  11/2003    Simple and complex hyperplasia  . Dilation and curettage of uterus  2011  . Wisdom tooth extraction    . Intrauterine device (iud) insertion N/A 12/14/2014    Procedure: INTRAUTERINE DEVICE (IUD) INSERTION;  Surgeon: Mora Bellman, MD;  Location: [facility];   Service: Gynecology;  Laterality: N/A;  . Dilation and curettage of uterus N/A 12/14/2014    Procedure: DILATATION AND CURETTAGE;  Surgeon: Mora Bellman, MD;  Location: [facility];  Service: Gynecology;  Laterality: N/A;   Family History  Problem Relation Age of Onset  . Hypertension Other   . Diabetes Other   . Cancer Other    History  Substance Use Topics  . Smoking status: Never Smoker   . Smokeless tobacco: Never Used  . Alcohol Use: No   ROS See pertinent in HPI  Blood pressure 158/84, pulse 84, temperature 98.8 F (37.1 C), temperature source Oral, height 5\' 5"  (1.651 m), weight 320 lb 9.6 oz (145.423 kg), last menstrual period 12/14/2014.  GENERAL: Well-developed, well-nourished female in no acute distress. Obese ABDOMEN: Soft, nontender, nondistended. No organomegaly. PELVIC: Normal external female genitalia. Vagina is pink and rugated.  Normal discharge. Normal appearing cervix. IUD strings were not visualized at the external os, even with the use of a cytobrush. Uterus is normal in size. No adnexal mass or tenderness. EXTREMITIES: No cyanosis, clubbing, or edema, 2+ distal pulses.  A/P 34 yo here for post op check - Will order a pelvic ultrasound for IUD location - Pathology results reviewed with the patient - Advised to keep track of a menstrual calendar - Patient will be contacted with the results of the ultrasound -  RTC prn

## 2015-01-12 ENCOUNTER — Ambulatory Visit (HOSPITAL_COMMUNITY)
Admission: RE | Admit: 2015-01-12 | Discharge: 2015-01-12 | Disposition: A | Discharge status: Home / Self Care | Payer: No Typology Code available for payment source | Source: Ambulatory Visit | Attending: Obstetrics and Gynecology | Admitting: Obstetrics and Gynecology

## 2015-01-12 DIAGNOSIS — Z30431 Encounter for routine checking of intrauterine contraceptive device: Secondary | ICD-10-CM | POA: Insufficient documentation

## 2015-01-14 ENCOUNTER — Telehealth: Payer: Self-pay

## 2015-01-14 NOTE — Telephone Encounter (Signed)
-----   Message from Mora Bellman, MD sent at 01/12/2015  4:28 PM EDT ----- Please inform patient that IUD is in the uterus. However, it is not positioned in the proper orientation, which explains why the strings were not visible on exam. Please assure the patient that the IUD is still functioning appropriately for the management of vaginal bleeding. If she develops pelvic pain or cramping pain, it may be related to the IUD and at that point, it will need to be removed/replaced. Please let me know what the patient opted to do.  Thank  Peggy

## 2015-01-14 NOTE — Telephone Encounter (Signed)
Called and LM that we calling with results and to please check your MyChart for information.

## 2015-03-24 ENCOUNTER — Ambulatory Visit: Payer: No Typology Code available for payment source | Admitting: Obstetrics and Gynecology

## 2015-05-27 ENCOUNTER — Emergency Department (HOSPITAL_COMMUNITY)
Admission: EM | Admit: 2015-05-27 | Discharge: 2015-05-27 | Disposition: A | Discharge status: Home / Self Care | Payer: No Typology Code available for payment source | Attending: Emergency Medicine | Admitting: Emergency Medicine

## 2015-05-27 ENCOUNTER — Emergency Department (HOSPITAL_COMMUNITY): Payer: No Typology Code available for payment source

## 2015-05-27 ENCOUNTER — Encounter (HOSPITAL_COMMUNITY): Payer: Self-pay | Admitting: *Deleted

## 2015-05-27 DIAGNOSIS — Z8719 Personal history of other diseases of the digestive system: Secondary | ICD-10-CM | POA: Diagnosis not present

## 2015-05-27 DIAGNOSIS — Y998 Other external cause status: Secondary | ICD-10-CM | POA: Insufficient documentation

## 2015-05-27 DIAGNOSIS — Y92410 Unspecified street and highway as the place of occurrence of the external cause: Secondary | ICD-10-CM | POA: Diagnosis not present

## 2015-05-27 DIAGNOSIS — S299XXA Unspecified injury of thorax, initial encounter: Secondary | ICD-10-CM | POA: Diagnosis present

## 2015-05-27 DIAGNOSIS — Y9389 Activity, other specified: Secondary | ICD-10-CM | POA: Insufficient documentation

## 2015-05-27 DIAGNOSIS — Z880 Allergy status to penicillin: Secondary | ICD-10-CM | POA: Diagnosis not present

## 2015-05-27 DIAGNOSIS — I10 Essential (primary) hypertension: Secondary | ICD-10-CM | POA: Insufficient documentation

## 2015-05-27 DIAGNOSIS — Z872 Personal history of diseases of the skin and subcutaneous tissue: Secondary | ICD-10-CM | POA: Diagnosis not present

## 2015-05-27 DIAGNOSIS — S20219A Contusion of unspecified front wall of thorax, initial encounter: Secondary | ICD-10-CM

## 2015-05-27 DIAGNOSIS — J45909 Unspecified asthma, uncomplicated: Secondary | ICD-10-CM | POA: Insufficient documentation

## 2015-05-27 DIAGNOSIS — Z87442 Personal history of urinary calculi: Secondary | ICD-10-CM | POA: Diagnosis not present

## 2015-05-27 DIAGNOSIS — Z8742 Personal history of other diseases of the female genital tract: Secondary | ICD-10-CM | POA: Diagnosis not present

## 2015-05-27 DIAGNOSIS — Z8669 Personal history of other diseases of the nervous system and sense organs: Secondary | ICD-10-CM | POA: Insufficient documentation

## 2015-05-27 DIAGNOSIS — Z862 Personal history of diseases of the blood and blood-forming organs and certain disorders involving the immune mechanism: Secondary | ICD-10-CM | POA: Insufficient documentation

## 2015-05-27 DIAGNOSIS — R52 Pain, unspecified: Secondary | ICD-10-CM

## 2015-05-27 MED ORDER — ERYTHROMYCIN 5 MG/GM OP OINT: TOPICAL_OINTMENT | OPHTHALMIC | Status: DC

## 2015-05-27 MED ORDER — CYCLOBENZAPRINE HCL 10 MG PO TABS: 10.0000 mg | ORAL_TABLET | Freq: Two times a day (BID) | ORAL | Status: DC | PRN

## 2015-05-27 MED ORDER — NAPROXEN 500 MG PO TABS: 500.0000 mg | ORAL_TABLET | Freq: Two times a day (BID) | ORAL | Status: DC

## 2015-05-27 MED ORDER — TOBRAMYCIN 0.3 % OP SOLN
1.0000 [drp] | Freq: Four times a day (QID) | OPHTHALMIC | Status: DC
Start: 2015-05-27 — End: 2016-03-07
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
## 2015-05-27 NOTE — ED Notes (Signed)
Tiffany, PA informed of b/p.

## 2016-01-02 ENCOUNTER — Ambulatory Visit: Payer: BLUE CROSS/BLUE SHIELD | Admitting: Obstetrics & Gynecology

## 2016-03-07 ENCOUNTER — Encounter (HOSPITAL_COMMUNITY): Payer: Self-pay | Admitting: Emergency Medicine

## 2016-03-07 ENCOUNTER — Emergency Department (HOSPITAL_COMMUNITY)
Admission: EM | Admit: 2016-03-07 | Discharge: 2016-03-07 | Disposition: A | Discharge status: Home / Self Care | Payer: BLUE CROSS/BLUE SHIELD | Attending: Emergency Medicine | Admitting: Emergency Medicine

## 2016-03-07 DIAGNOSIS — I10 Essential (primary) hypertension: Secondary | ICD-10-CM | POA: Diagnosis not present

## 2016-03-07 DIAGNOSIS — H109 Unspecified conjunctivitis: Secondary | ICD-10-CM | POA: Insufficient documentation

## 2016-03-07 DIAGNOSIS — J020 Streptococcal pharyngitis: Secondary | ICD-10-CM | POA: Insufficient documentation

## 2016-03-07 DIAGNOSIS — J45909 Unspecified asthma, uncomplicated: Secondary | ICD-10-CM | POA: Diagnosis not present

## 2016-03-07 DIAGNOSIS — J029 Acute pharyngitis, unspecified: Secondary | ICD-10-CM

## 2016-03-07 DIAGNOSIS — Z791 Long term (current) use of non-steroidal anti-inflammatories (NSAID): Secondary | ICD-10-CM | POA: Insufficient documentation

## 2016-03-07 LAB — RAPID STREP SCREEN (MED CTR MEBANE ONLY): STREPTOCOCCUS, GROUP A SCREEN (DIRECT): POSITIVE — AB

## 2016-03-07 MED ORDER — LIDOCAINE VISCOUS 2 % MT SOLN
15.0000 mL | Freq: Once | OROMUCOSAL | Status: AC
  Administered 2016-03-07: 15 mL via OROMUCOSAL
  Filled 2016-03-07: qty 15

## 2016-03-07 MED ORDER — IBUPROFEN 800 MG PO TABS
800.0000 mg | ORAL_TABLET | Freq: Once | ORAL | Status: AC
  Administered 2016-03-07: 800 mg via ORAL
  Filled 2016-03-07: qty 1

## 2016-03-07 MED ORDER — CLINDAMYCIN HCL 300 MG PO CAPS: 300.0000 mg | ORAL_CAPSULE | Freq: Four times a day (QID) | ORAL | 0 refills | Status: DC

## 2016-03-07 MED ORDER — TOBRAMYCIN 0.3 % OP SOLN: 2.0000 [drp] | Freq: Four times a day (QID) | OPHTHALMIC | 0 refills | Status: DC

## 2016-03-07 MED ORDER — CLINDAMYCIN HCL 300 MG PO CAPS
300.0000 mg | ORAL_CAPSULE | Freq: Once | ORAL | Status: AC
  Administered 2016-03-07: 300 mg via ORAL
  Filled 2016-03-07: qty 1

## 2016-03-07 NOTE — ED Provider Notes (Signed)
[facility] DEPT Provider Note   CSN: [id number] Arrival date & time: 03/07/16  0130  First Provider Contact:  First MD Initiated Contact with Patient 03/07/16 0303     By signing my name below, I, Ephriam Jenkins, attest that this documentation has been prepared under the direction and in the presence of Lashawne Dura, MD. Electronically signed, Ephriam Jenkins, ED Scribe. 03/07/16. 3:26 AM.   History   Chief Complaint Chief Complaint  Patient presents with  . Sore Throat    HPI HPI Comments: Molly Archer is a 35 y.o. female who presents to the Emergency Department complaining of sore throat with associated congestion and right eye irritation that has been persistent for the past 3 days. Three mornings ago; pt states that she woke up with right eye irritation and redness. Pt further reports intermittent headache for the past 3 days. Pt has taken Mucinex and tylenol cold and flu with no relief. Pt denies any known sick contacts. Pt denies fever, vomiting. LNMP March 2016.   The history is provided by the patient. No language interpreter was used.  Sore Throat  This is a new problem. The current episode started more than 2 days ago. The problem occurs constantly. The problem has not changed since onset.Associated symptoms include headaches (intermittent). Nothing aggravates the symptoms. Nothing relieves the symptoms. She has tried acetaminophen for the symptoms. The treatment provided no relief.    Past Medical History:  Diagnosis Date  . Anemia   . Asthma    only uses inhaler in fall and spring  . GERD (gastroesophageal reflux disease)    diet controlled, occ. uses omeprazole  . Headache    otc med prn  . History of kidney stones   . Hx of Bell's palsy June 2013  . Hypertension    currently no meds  . Menometrorrhagia   . Psoriasis   . Shortness of breath dyspnea    with exercise/exertion    Patient Active Problem List   Diagnosis Date Noted  . DUB (dysfunctional  uterine bleeding)   . Endometrial cancer determined by uterine biopsy ([facility]) 01/14/2014  . Menometrorrhagia   . Amenorrhea 11/25/2013  . Obesity 09/08/2012  . HPV (human papilloma virus) infection 02/28/2012  . LSIL (low grade squamous intraepithelial lesion) on Pap smear 02/28/2012  . Hypertension 02/25/2012  . Hx of Bell's palsy 02/25/2012    Past Surgical History:  Procedure Laterality Date  . COMBINED HYSTEROSCOPY DIAGNOSTIC / D&C N/A 2011   Benign secretory endometrium  . CYSTOSCOPY WITH STENT PLACEMENT Left 10/19/2014   Procedure: CYSTOSCOPY WITH STENT PLACEMENT;  Surgeon: Ardis Hughs, MD;  Location: [facility];  Service: Urology;  Laterality: Left;  . DILATION AND CURETTAGE OF UTERUS  10/2003   Polyp, mild atypia, simple and complex hyperplasia,  . DILATION AND CURETTAGE OF UTERUS  11/2003   Simple and complex hyperplasia  . DILATION AND CURETTAGE OF UTERUS  2011  . DILATION AND CURETTAGE OF UTERUS N/A 12/14/2014   Procedure: DILATATION AND CURETTAGE;  Surgeon: Mora Bellman, MD;  Location: [facility];  Service: Gynecology;  Laterality: N/A;  . HOLMIUM LASER APPLICATION Left 0000000   Procedure: HOLMIUM LASER APPLICATION;  Surgeon: Ardis Hughs, MD;  Location: [facility];  Service: Urology;  Laterality: Left;  . INTRAUTERINE DEVICE (IUD) INSERTION N/A 12/14/2014   Procedure: INTRAUTERINE DEVICE (IUD) INSERTION;  Surgeon: Mora Bellman, MD;  Location: [facility];  Service: Gynecology;  Laterality: N/A;  . STONE EXTRACTION WITH BASKET Left 10/19/2014  Procedure: STONE EXTRACTION WITH BASKET;  Surgeon: Ardis Hughs, MD;  Location: [facility];  Service: Urology;  Laterality: Left;  . WISDOM TOOTH EXTRACTION      OB History    Gravida Para Term Preterm AB Living   0 0 0 0 0 0   SAB TAB Ectopic Multiple Live Births   0 0 0 0         Home Medications    Prior to Admission medications   Medication Sig Start Date End Date Taking? Authorizing Provider  albuterol (PROVENTIL  HFA;VENTOLIN HFA) 108 (90 BASE) MCG/ACT inhaler Inhale 2 puffs into the lungs every 4 (four) hours as needed for wheezing or shortness of breath. For cough/wheeze.    Historical Provider, MD  bismuth subsalicylate (PEPTO BISMOL) 262 MG/15ML suspension Take 30 mLs by mouth every 6 (six) hours as needed for indigestion.    Historical Provider, MD  cyclobenzaprine (FLEXERIL) 10 MG tablet Take 1 tablet (10 mg total) by mouth 2 (two) times daily as needed for muscle spasms. 05/27/15   Tiffany Carlota Raspberry, PA-C  ibuprofen (ADVIL,MOTRIN) 600 MG tablet Take 1 tablet (600 mg total) by mouth every 6 (six) hours as needed. 12/15/14   Peggy Constant, MD  naproxen (NAPROSYN) 500 MG tablet Take 1 tablet (500 mg total) by mouth 2 (two) times daily. 05/27/15   Tiffany Carlota Raspberry, PA-C  tobramycin (TOBREX) 0.3 % ophthalmic solution Place 1 drop into the left eye every 6 (six) hours. 05/27/15   Delos Haring, PA-C    Family History Family History  Problem Relation Age of Onset  . Hypertension Other   . Diabetes Other   . Cancer Other     Social History Social History  Substance Use Topics  . Smoking status: Never Smoker  . Smokeless tobacco: Never Used  . Alcohol use No     Allergies   Codeine; Peanut-containing drug products; Penicillins; and Shellfish allergy   Review of Systems Review of Systems  Constitutional: Negative for fever.  HENT: Positive for sore throat.   Eyes: Positive for redness (right).  Gastrointestinal: Negative for vomiting.  Neurological: Positive for headaches (intermittent).  All other systems reviewed and are negative.    Physical Exam Updated Vital Signs BP 168/91 (BP Location: Left Wrist)   Pulse 77   Temp 98.5 F (36.9 C) (Oral)   Resp 21   Ht 5\' 5"  (1.651 m)   Wt (!) 335 lb (152 kg)   SpO2 99%   BMI 55.75 kg/m   Physical Exam  Constitutional: She appears well-developed and well-nourished. No distress.  HENT:  Head: Normocephalic.  Mouth/Throat: Oropharynx is  clear and moist. No oropharyngeal exudate.  Intact phonation.   Eyes: EOM are normal. Pupils are equal, round, and reactive to light. Right eye exhibits no discharge. Left eye exhibits no discharge. No scleral icterus.  Conjunctivae injected on right; not on left   Neck: Normal range of motion. Neck supple. No JVD present.  Easily displaceable trachea. No stridor or carotid bruits. No lymphadenopathy  Cardiovascular: Normal rate, regular rhythm, normal heart sounds and intact distal pulses.   No murmur heard. Pulmonary/Chest: Effort normal and breath sounds normal. No stridor. No respiratory distress. She has no wheezes. She has no rales.  Lungs CTA bilaterally.  Abdominal: Soft. Bowel sounds are normal. She exhibits no distension. There is no tenderness. There is no rebound and no guarding.  Lymphadenopathy:    She has no cervical adenopathy.  Neurological: She is alert. She has  normal reflexes. She displays normal reflexes. She exhibits normal muscle tone.  Skin: Skin is warm. Capillary refill takes less than 2 seconds.  Psychiatric: She has a normal mood and affect. Her behavior is normal.  Nursing note and vitals reviewed.    ED Treatments / Results   Vitals:   03/07/16 0205  BP: 168/91  Pulse: 77  Resp: 21  Temp: 98.5 F (36.9 C)   Results for orders placed or performed during the hospital encounter of 12/14/14  Pregnancy, urine  Result Value Ref Range   Preg Test, Ur NEGATIVE NEGATIVE   No results found.  Labs (all labs ordered are listed, but only abnormal results are displayed) Labs Reviewed  RAPID STREP SCREEN (NOT AT [facility])    EKG  EKG Interpretation None       Radiology No results found.  Procedures Procedures (including critical care time)  Medications Ordered in ED Medications - No data to display   Initial Impression / Assessment and Plan / ED Course  I have reviewed the triage vital signs and the nursing notes.  Pertinent labs & imaging  results that were available during my care of the patient were reviewed by me and considered in my medical decision making (see chart for details).  Clinical Course   Vitals:   03/07/16 0205  BP: 168/91  Pulse: 77  Resp: 21  Temp: 98.5 F (36.9 C)    Results for orders placed or performed during the hospital encounter of 03/07/16  Rapid strep screen  Result Value Ref Range   Streptococcus, Group A Screen (Direct) POSITIVE (A) NEGATIVE   No results found. Medications  lidocaine (XYLOCAINE) 2 % viscous mouth solution 15 mL (not administered)  clindamycin (CLEOCIN) capsule 300 mg (not administered)  ibuprofen (ADVIL,MOTRIN) tablet 800 mg (800 mg Oral Given 03/07/16 0330)    Final Clinical Impressions(s) / ED Diagnoses   Final diagnoses:  None   Will send home with clindamycin and eye drops.  Follow up with ENT. All questions answered to patient's satisfaction. Based on history and exam patient has been appropriately medically screened and emergency conditions excluded. Patient is stable for discharge at this time. Follow up with your PMDfor recheck in 2 daysand strict return precautions given. New Prescriptions New Prescriptions   No medications on file  I personally performed the services described in this documentation, which was scribed in my presence. The recorded information has been reviewed and is accurate.       Veatrice Kells, MD 03/07/16 [phone]

## 2016-03-07 NOTE — ED Triage Notes (Signed)
Pt c/o sore throat x 2-3 days, [place] the beach [place]. Pt also c/o irritation and redness to R eye

## 2016-03-16 IMAGING — US US TRANSVAGINAL NON-OB
1 series · 13 of 25 positions shown · non-contrast
Comparison: US PELVIS COMPLETE dated 11/10/2013

CLINICAL DATA: Pelvic pain and vaginal bleeding. Known endometrial
hyperplasia versus polyp.

EXAM:
TRANSVAGINAL ULTRASOUND OF PELVIS
TECHNIQUE: Transvaginal ultrasound examination of the pelvis was performed
including evaluation of the uterus, ovaries, adnexal regions, and
pelvic cul-de-sac.

[Series 1: us transvaginal non-ob · 13 of 33 slices shown]
[im 1/33]
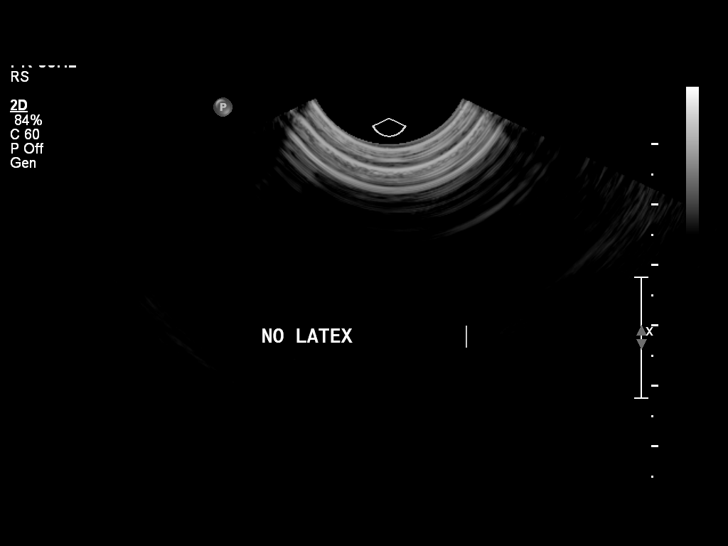
[im 3/33]
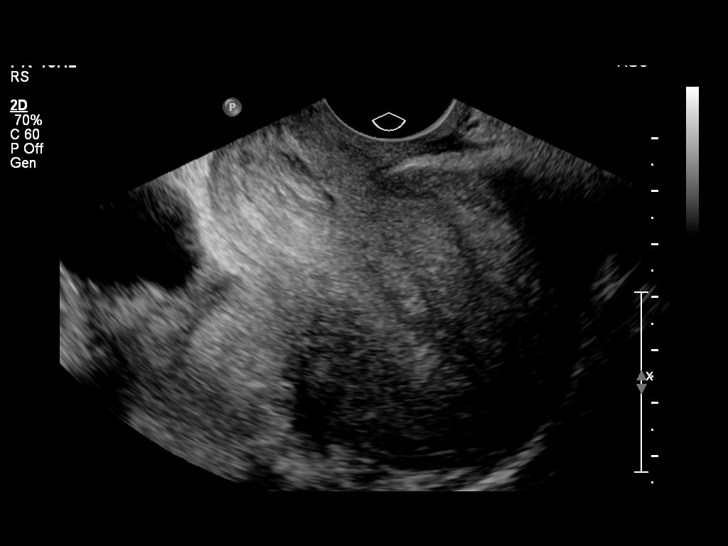
[im 6/33]
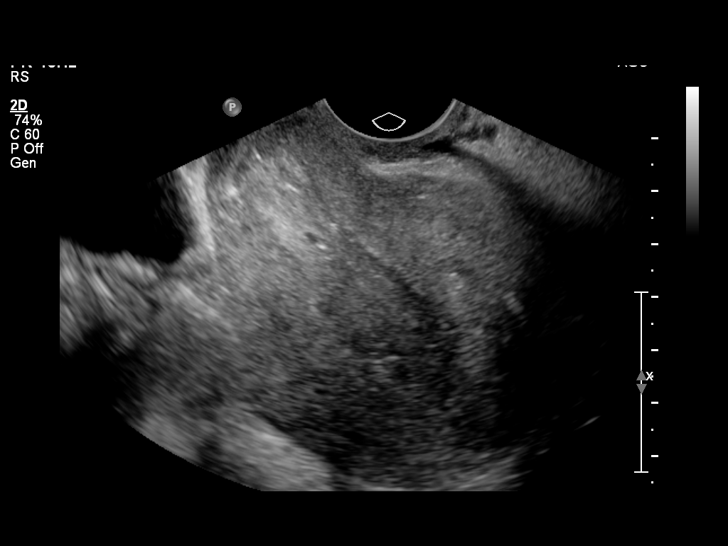
[im 9/33]
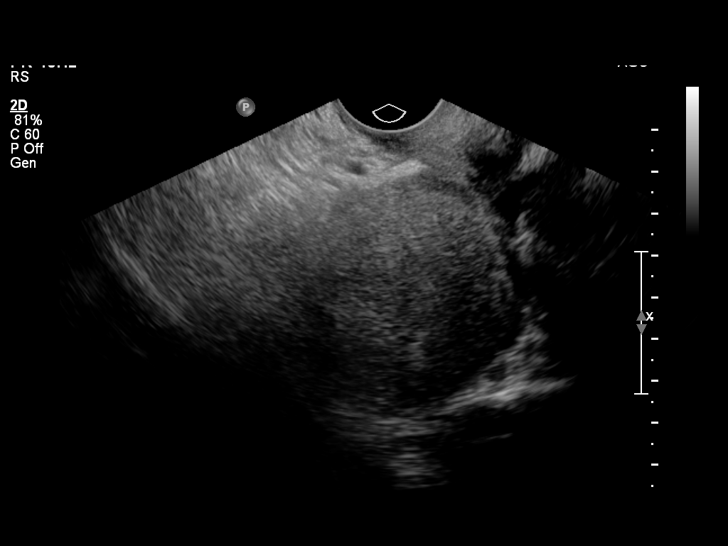
[im 11/33]
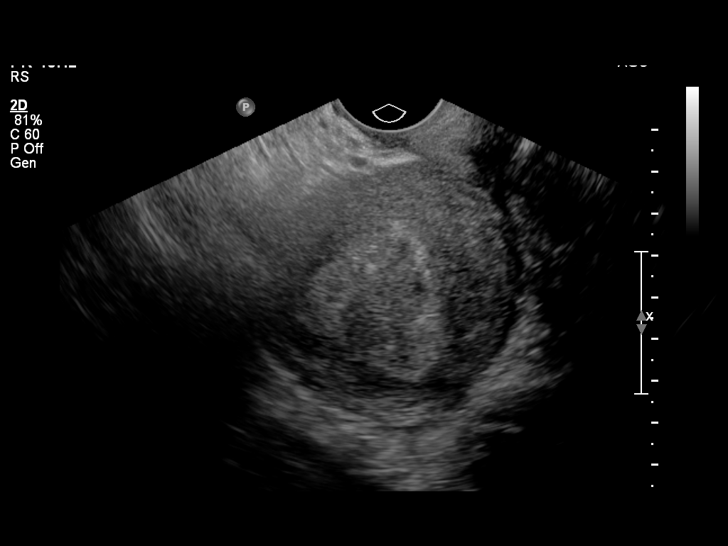
[im 14/33]
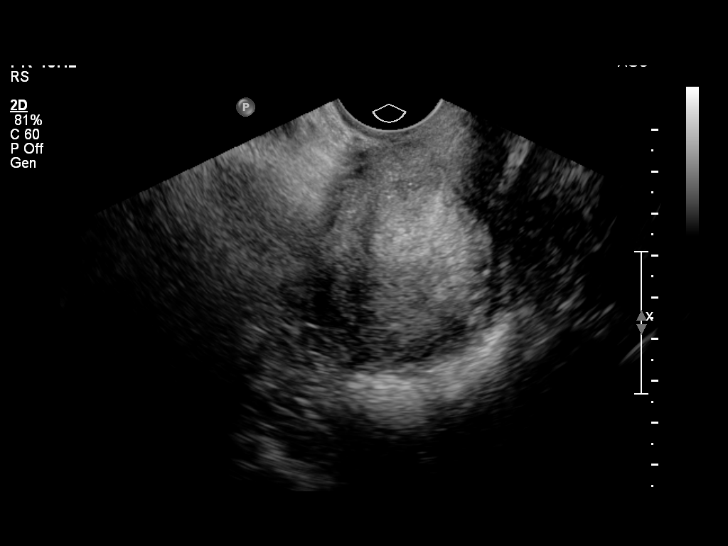
[im 17/33]
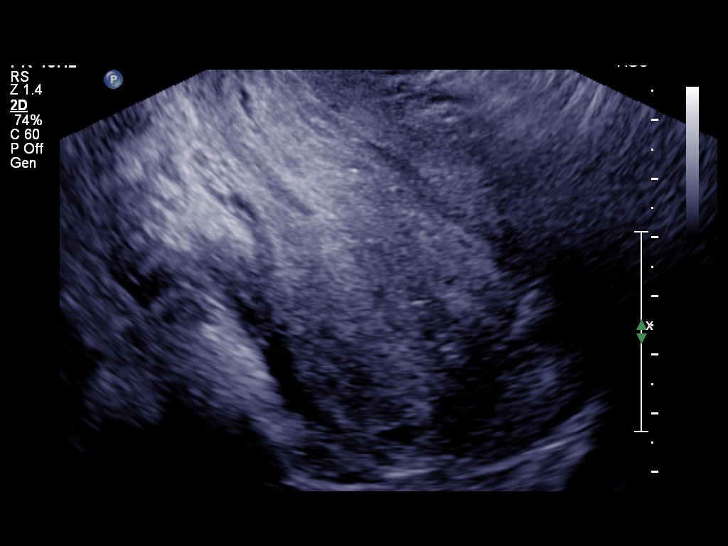
[im 19/33]
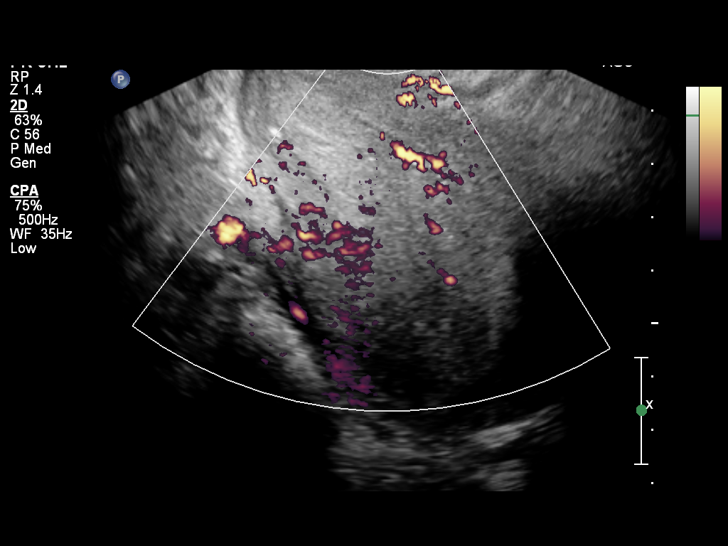
[im 22/33]
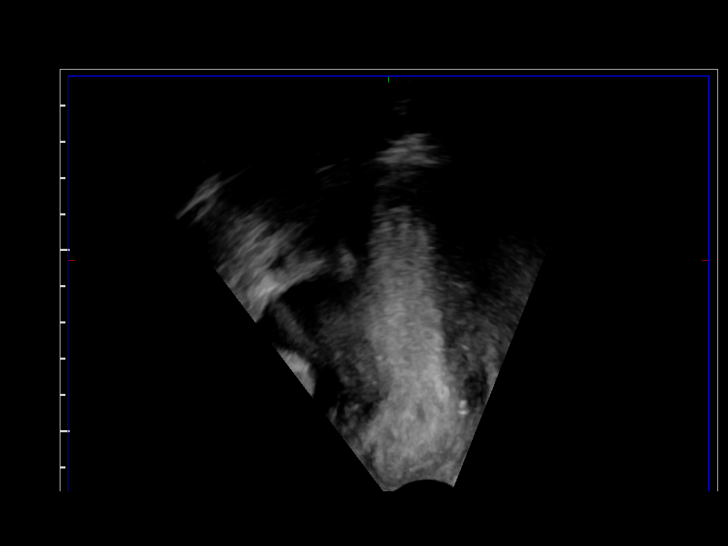
[im 25/33]
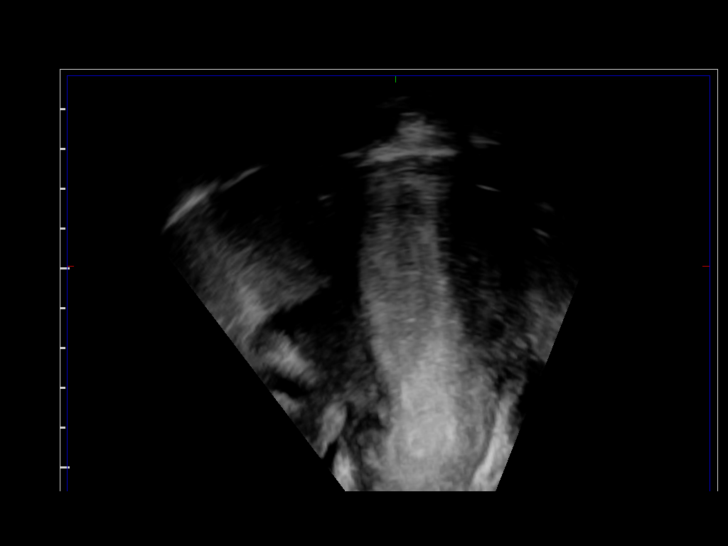
[im 27/33]
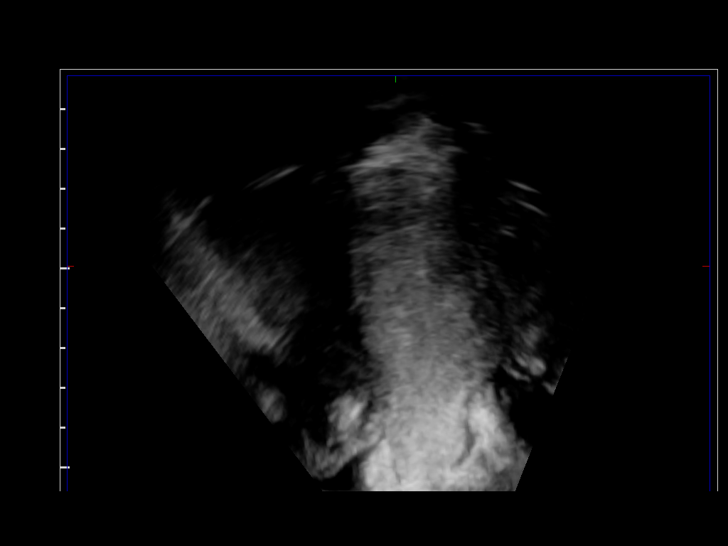
[im 30/33]
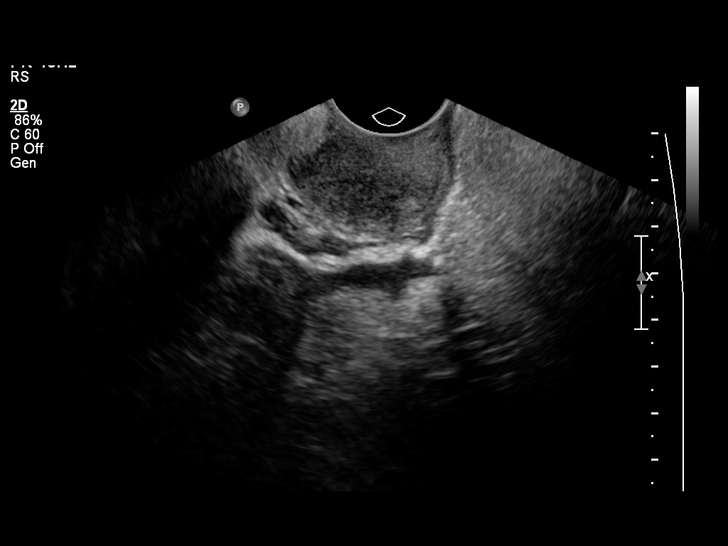
[im 33/33]
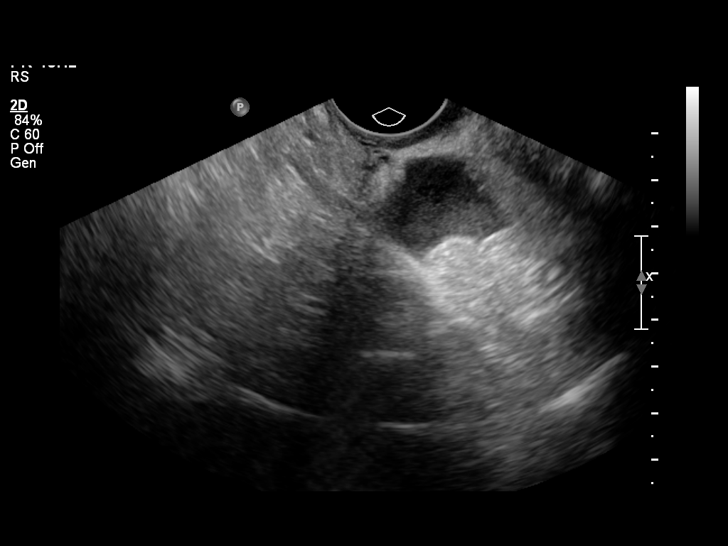

[13 of 25 positions shown; findings below may reference images not displayed]

FINDINGS: Uterus

Measurements: 8.8 x 6 x 6.2 cm, retroverted. No fibroids or other
mass visualized.

Endometrium

Thickness: 2.8 cm. Diffusely thickened and heterogeneous in
echotexture similar to prior study. Increase flow is demonstrated in
the endometrium on color flow Doppler imaging. No fluid is seen.
Changes could represent endometrial hyperplasia, neoplasm, or
endometrial polyps.

Right ovary

Right ovary is not visualized.  Areas obscured by bowel gas.

Left ovary

Measurements: 2.1 x 1.7 x 2 cm. Normal appearance/no adnexal mass.

Other findings:  No free fluid
IMPRESSION: Heterogeneous thickened endometrium with increased flow
demonstrated. Differential diagnosis would include hyperplasia,
neoplasm, or polyps. Right ovary is not visualized. Left ovary is
unremarkable. If bleeding remains unresponsive to hormonal or
medical therapy, focal lesion work-up with sonohysterogram should be
considered. Endometrial biopsy should also be considered in
pre-menopausal patients at high risk for endometrial carcinoma.
(Ref: Radiological Reasoning: Algorithmic Workup of Abnormal Vaginal
Bleeding with Endovaginal Sonography and Sonohysterography. AJR
3222; 191:S68-73)

## 2016-06-12 ENCOUNTER — Encounter (HOSPITAL_COMMUNITY): Payer: Self-pay | Admitting: Emergency Medicine

## 2016-06-12 ENCOUNTER — Emergency Department (HOSPITAL_COMMUNITY)
Admission: EM | Admit: 2016-06-12 | Discharge: 2016-06-12 | Disposition: A | Discharge status: Home / Self Care | Payer: BLUE CROSS/BLUE SHIELD | Attending: Emergency Medicine | Admitting: Emergency Medicine

## 2016-06-12 ENCOUNTER — Emergency Department (HOSPITAL_COMMUNITY): Payer: BLUE CROSS/BLUE SHIELD

## 2016-06-12 DIAGNOSIS — J988 Other specified respiratory disorders: Secondary | ICD-10-CM

## 2016-06-12 DIAGNOSIS — R062 Wheezing: Secondary | ICD-10-CM | POA: Insufficient documentation

## 2016-06-12 DIAGNOSIS — Z8542 Personal history of malignant neoplasm of other parts of uterus: Secondary | ICD-10-CM | POA: Diagnosis not present

## 2016-06-12 DIAGNOSIS — Z79899 Other long term (current) drug therapy: Secondary | ICD-10-CM | POA: Insufficient documentation

## 2016-06-12 DIAGNOSIS — I10 Essential (primary) hypertension: Secondary | ICD-10-CM | POA: Insufficient documentation

## 2016-06-12 DIAGNOSIS — Z91010 Allergy to peanuts: Secondary | ICD-10-CM | POA: Diagnosis not present

## 2016-06-12 DIAGNOSIS — R05 Cough: Secondary | ICD-10-CM | POA: Diagnosis present

## 2016-06-12 LAB — I-STAT CHEM 8, ED
BUN: 9 mg/dL (ref 6–20)
CHLORIDE: 102 mmol/L (ref 101–111)
Calcium, Ion: 1.1 mmol/L — ABNORMAL LOW (ref 1.15–1.40)
Creatinine, Ser: 0.4 mg/dL — ABNORMAL LOW (ref 0.44–1.00)
GLUCOSE: 96 mg/dL (ref 65–99)
HEMATOCRIT: 33 % — AB (ref 36.0–46.0)
HEMOGLOBIN: 11.2 g/dL — AB (ref 12.0–15.0)
POTASSIUM: 3.9 mmol/L (ref 3.5–5.1)
Sodium: 140 mmol/L (ref 135–145)
TCO2: 25 mmol/L (ref 0–100)

## 2016-06-12 LAB — D-DIMER, QUANTITATIVE (NOT AT ARMC): D DIMER QUANT: 0.31 ug{FEU}/mL (ref 0.00–0.50)

## 2016-06-12 MED ORDER — AZITHROMYCIN 250 MG PO TABS: 250.0000 mg | ORAL_TABLET | Freq: Every day | ORAL | 0 refills | Status: DC

## 2016-06-12 MED ORDER — PREDNISONE 20 MG PO TABS
60.0000 mg | ORAL_TABLET | Freq: Once | ORAL | Status: AC
  Administered 2016-06-12: 60 mg via ORAL
  Filled 2016-06-12: qty 3

## 2016-06-12 MED ORDER — PREDNISONE 20 MG PO TABS: 60.0000 mg | ORAL_TABLET | Freq: Every day | ORAL | 0 refills | Status: AC

## 2016-06-12 MED ORDER — ALBUTEROL SULFATE HFA 108 (90 BASE) MCG/ACT IN AERS
4.0000 | INHALATION_SPRAY | Freq: Once | RESPIRATORY_TRACT | Status: AC
  Administered 2016-06-12: 4 via RESPIRATORY_TRACT
  Filled 2016-06-12: qty 6.7

## 2016-06-12 NOTE — ED Provider Notes (Signed)
[facility] DEPT Provider Note   CSN: [id number] Arrival date & time: 06/12/16  [id number]     History   Chief Complaint Chief Complaint  Patient presents with  . Cough    HPI Molly Archer is a 35 y.o. female.  HPI 35 year old female with past medical history of mild asthma and obesity who presents with a month-long history of cough. The patient states her symptoms started a month ago with cough and fever. She was seen by her student health provider who prescribed doxycycline. She states she took as prescribed but had no improvement in her cough. Over the last month, she has had persistent, dry, hacking cough that is now productive of blood-tinged sputum. Denies any chest pain at rest but endorses mild pleuritic pain as well as bilateral chest pain after coughing. She denies any shortness of breath. She has a history of asthma but has not been using her inhaler regularly. She was not prescribed any steroids. Denies any recent fevers or chills. Denies any nausea or vomiting. She does have known sick contacts.  Past Medical History:  Diagnosis Date  . Anemia   . Asthma    only uses inhaler in fall and spring  . GERD (gastroesophageal reflux disease)    diet controlled, occ. uses omeprazole  . Headache    otc med prn  . History of kidney stones   . Hx of Bell's palsy June 2013  . Hypertension    currently no meds  . Menometrorrhagia   . Psoriasis   . Shortness of breath dyspnea    with exercise/exertion    Patient Active Problem List   Diagnosis Date Noted  . DUB (dysfunctional uterine bleeding)   . Endometrial cancer determined by uterine biopsy ([place]) 01/14/2014  . Menometrorrhagia   . Amenorrhea 11/25/2013  . Obesity 09/08/2012  . HPV (human papilloma virus) infection 02/28/2012  . LSIL (low grade squamous intraepithelial lesion) on Pap smear 02/28/2012  . Hypertension 02/25/2012  . Hx of Bell's palsy 02/25/2012    Past Surgical History:  Procedure Laterality  Date  . COMBINED HYSTEROSCOPY DIAGNOSTIC / D&C N/A 2011   Benign secretory endometrium  . CYSTOSCOPY WITH STENT PLACEMENT Left 10/19/2014   Procedure: CYSTOSCOPY WITH STENT PLACEMENT;  Surgeon: Ardis Hughs, MD;  Location: [facility];  Service: Urology;  Laterality: Left;  . DILATION AND CURETTAGE OF UTERUS  10/2003   Polyp, mild atypia, simple and complex hyperplasia,  . DILATION AND CURETTAGE OF UTERUS  11/2003   Simple and complex hyperplasia  . DILATION AND CURETTAGE OF UTERUS  2011  . DILATION AND CURETTAGE OF UTERUS N/A 12/14/2014   Procedure: DILATATION AND CURETTAGE;  Surgeon: Mora Bellman, MD;  Location: [facility];  Service: Gynecology;  Laterality: N/A;  . HOLMIUM LASER APPLICATION Left 0000000   Procedure: HOLMIUM LASER APPLICATION;  Surgeon: Ardis Hughs, MD;  Location: [facility];  Service: Urology;  Laterality: Left;  . INTRAUTERINE DEVICE (IUD) INSERTION N/A 12/14/2014   Procedure: INTRAUTERINE DEVICE (IUD) INSERTION;  Surgeon: Mora Bellman, MD;  Location: [facility];  Service: Gynecology;  Laterality: N/A;  . STONE EXTRACTION WITH BASKET Left 10/19/2014   Procedure: STONE EXTRACTION WITH BASKET;  Surgeon: Ardis Hughs, MD;  Location: [facility];  Service: Urology;  Laterality: Left;  . WISDOM TOOTH EXTRACTION      OB History    Gravida Para Term Preterm AB Living   0 0 0 0 0 0   SAB TAB Ectopic Multiple Live Births  0 0 0 0         Home Medications    Prior to Admission medications   Medication Sig Start Date End Date Taking? Authorizing Provider  albuterol (PROVENTIL HFA;VENTOLIN HFA) 108 (90 BASE) MCG/ACT inhaler Inhale 2 puffs into the lungs every 4 (four) hours as needed for wheezing or shortness of breath. For cough/wheeze.   Yes Historical Provider, MD  bismuth subsalicylate (PEPTO BISMOL) 262 MG/15ML suspension Take 30 mLs by mouth every 6 (six) hours as needed for indigestion.   Yes Historical Provider, MD  cetirizine (ZYRTEC) 10 MG tablet Take 10 mg by  mouth daily.   Yes Historical Provider, MD  guaiFENesin (MUCINEX) 600 MG 12 hr tablet Take 600 mg by mouth 2 (two) times daily.   Yes Historical Provider, MD  ibuprofen (ADVIL,MOTRIN) 200 MG tablet Take 400 mg by mouth every 6 (six) hours as needed for moderate pain.   Yes Historical Provider, MD  levonorgestrel (MIRENA) 20 MCG/24HR IUD 1 each by Intrauterine route once.   Yes Historical Provider, MD  Phenyleph-CPM-DM-Aspirin (ALKA-SELTZER PLUS COLD & COUGH) 7.02-28-09-325 MG TBEF Take 1 each by mouth as needed (cold).   Yes Historical Provider, MD  Phenyleph-Doxylamine-DM-APAP (NYQUIL SEVERE COLD/FLU) 5-6.25-10-325 MG/15ML LIQD Take 30 mLs by mouth at bedtime as needed (sleep).   Yes Historical Provider, MD  azithromycin (ZITHROMAX) 250 MG tablet Take 1 tablet (250 mg total) by mouth daily. Take first 2 tablets together, then 1 every day until finished. 06/12/16   Duffy Bruce, MD  clindamycin (CLEOCIN) 300 MG capsule Take 1 capsule (300 mg total) by mouth 4 (four) times daily. X 7 days Patient not taking: Reported on 06/12/2016 03/07/16   April Palumbo, MD  cyclobenzaprine (FLEXERIL) 10 MG tablet Take 1 tablet (10 mg total) by mouth 2 (two) times daily as needed for muscle spasms. Patient not taking: Reported on 06/12/2016 05/27/15   Delos Haring, PA-C  ibuprofen (ADVIL,MOTRIN) 600 MG tablet Take 1 tablet (600 mg total) by mouth every 6 (six) hours as needed. Patient not taking: Reported on 06/12/2016 12/15/14   Mora Bellman, MD  naproxen (NAPROSYN) 500 MG tablet Take 1 tablet (500 mg total) by mouth 2 (two) times daily. Patient not taking: Reported on 06/12/2016 05/27/15   Delos Haring, PA-C  predniSONE (DELTASONE) 20 MG tablet Take 3 tablets (60 mg total) by mouth daily. 06/12/16 06/17/16  Duffy Bruce, MD  tobramycin (TOBREX) 0.3 % ophthalmic solution Place 2 drops into the right eye every 6 (six) hours. Patient not taking: Reported on 06/12/2016 03/07/16   April Palumbo, MD    Family  History Family History  Problem Relation Age of Onset  . Hypertension Other   . Diabetes Other   . Cancer Other     Social History Social History  Substance Use Topics  . Smoking status: Never Smoker  . Smokeless tobacco: Never Used  . Alcohol use No     Allergies   Codeine; Peanut-containing drug products; Penicillins; and Shellfish allergy   Review of Systems Review of Systems  Constitutional: Negative for chills and fever.  HENT: Negative for congestion, rhinorrhea and sore throat.   Eyes: Negative for visual disturbance.  Respiratory: Positive for cough, shortness of breath and wheezing.   Cardiovascular: Negative for chest pain and leg swelling.  Gastrointestinal: Negative for abdominal pain, diarrhea, nausea and vomiting.  Genitourinary: Negative for dysuria, flank pain, vaginal bleeding and vaginal discharge.  Musculoskeletal: Negative for neck pain.  Skin: Negative for rash.  Allergic/Immunologic:  Negative for immunocompromised state.  Neurological: Negative for syncope and headaches.  Hematological: Does not bruise/bleed easily.  All other systems reviewed and are negative.    Physical Exam Updated Vital Signs BP 143/95   Pulse 66   Temp 98.5 F (36.9 C) (Oral)   Resp 11   Ht 5\' 5"  (1.651 m)   Wt (!) 310 lb (140.6 kg)   SpO2 99%   BMI 51.59 kg/m   Physical Exam  Constitutional: She is oriented to person, place, and time. She appears well-developed and well-nourished. No distress.  HENT:  Head: Normocephalic and atraumatic.  Eyes: Conjunctivae are normal.  Neck: Neck supple.  Cardiovascular: Normal rate, regular rhythm and normal heart sounds.  Exam reveals no friction rub.   No murmur heard. Pulmonary/Chest: Effort normal. Tachypnea noted. No respiratory distress. She has no decreased breath sounds. She has wheezes (Mild, end expiratory). She has no rhonchi. She has no rales.  Abdominal: She exhibits no distension.  Musculoskeletal: She exhibits  no edema.  Neurological: She is alert and oriented to person, place, and time. She exhibits normal muscle tone.  Skin: Skin is warm. Capillary refill takes less than 2 seconds.  Psychiatric: She has a normal mood and affect.  Nursing note and vitals reviewed.    ED Treatments / Results  Labs (all labs ordered are listed, but only abnormal results are displayed) Labs Reviewed  I-STAT CHEM 8, ED - Abnormal; Notable for the following:       Result Value   Creatinine, Ser 0.40 (*)    Calcium, Ion 1.10 (*)    Hemoglobin 11.2 (*)    HCT 33.0 (*)    All other components within normal limits  D-DIMER, QUANTITATIVE (NOT AT [facility])    EKG  EKG Interpretation None       Radiology Dg Chest 2 View  Result Date: 06/12/2016 CLINICAL DATA:  Cough for 4 weeks.  Blood-tinged sputum.  Nonsmoker. EXAM: CHEST  2 VIEW COMPARISON:  05/27/2015. FINDINGS: The heart size and mediastinal contours are within normal limits. Both lungs are free of infiltrates. Low lung volumes accentuate the vascular markings. The visualized skeletal structures are unremarkable. IMPRESSION: No active cardiopulmonary disease. Electronically Signed   By: Staci Righter M.D.   On: 06/12/2016 07:17    Procedures Procedures (including critical care time)  Medications Ordered in ED Medications  albuterol (PROVENTIL HFA;VENTOLIN HFA) 108 (90 Base) MCG/ACT inhaler 4 puff (4 puffs Inhalation Given 06/12/16 0728)  predniSONE (DELTASONE) tablet 60 mg (60 mg Oral Given 06/12/16 0728)     Initial Impression / Assessment and Plan / ED Course  I have reviewed the triage vital signs and the nursing notes.  Pertinent labs & imaging results that were available during my care of the patient were reviewed by me and considered in my medical decision making (see chart for details).  Clinical Course     35 year old female with past medical history as above who presents with a several week history of wheezing and shortness of breath.  On arrival, vital signs are stable. Exam shows mild, diffuse wheezing with normal work of breathing. Chest x-ray shows no evidence of pneumonia. She has no tachycardia, tachypnea, hypoxia, and d-dimer is negative and I do not suspect PE. Chemistries are largely within normal limits. She has a mild anemia that she'll follow up with her doctor for. I suspect patient has ongoing cough secondary to post viral reactive airway disease. Will give a short course of steroids, albuterol, and  advised outpatient follow-up. Given absence of fever, focal findings on chest x-ray, and recent completion of antibiotics, do not feel antibiotics are indicated at this time.  Final Clinical Impressions(s) / ED Diagnoses   Final diagnoses:  Wheezing-associated respiratory infection (WARI)    New Prescriptions Discharge Medication List as of 06/12/2016  8:46 AM    START taking these medications   Details  azithromycin (ZITHROMAX) 250 MG tablet Take 1 tablet (250 mg total) by mouth daily. Take first 2 tablets together, then 1 every day until finished., Starting Tue 06/12/2016, Print    predniSONE (DELTASONE) 20 MG tablet Take 3 tablets (60 mg total) by mouth daily., Starting Tue 06/12/2016, Until Sun 06/17/2016, Print         Duffy Bruce, MD 06/12/16 [phone]

## 2016-06-12 NOTE — ED Triage Notes (Signed)
Pt reports nonproductive cough for last four weeks.  Reports that she began coughing up blood recently and came to ED as a result.  Reports SOB and DOE.  Denies CP or hx heart or lung issues.  Has an inhaler but doesn't use it.  Has been using OTC cold/cough medications without relief.  No fever reported.

## 2017-01-02 ENCOUNTER — Ambulatory Visit: Payer: BLUE CROSS/BLUE SHIELD | Admitting: Obstetrics and Gynecology

## 2017-04-03 ENCOUNTER — Encounter: Payer: Self-pay | Admitting: Podiatry

## 2017-04-03 ENCOUNTER — Ambulatory Visit (INDEPENDENT_AMBULATORY_CARE_PROVIDER_SITE_OTHER): Payer: BLUE CROSS/BLUE SHIELD | Admitting: Podiatry

## 2017-04-03 ENCOUNTER — Ambulatory Visit (INDEPENDENT_AMBULATORY_CARE_PROVIDER_SITE_OTHER): Payer: BLUE CROSS/BLUE SHIELD

## 2017-04-03 DIAGNOSIS — M722 Plantar fascial fibromatosis: Secondary | ICD-10-CM | POA: Diagnosis not present

## 2017-04-03 MED ORDER — DICLOFENAC SODIUM 75 MG PO TBEC
75.0000 mg | DELAYED_RELEASE_TABLET | Freq: Two times a day (BID) | ORAL | 2 refills | Status: DC
Start: 2017-04-03 — End: 2018-04-17

## 2017-04-03 NOTE — Progress Notes (Signed)
   Subjective:    Patient ID: Molly Archer, female    DOB: 27-Aug-1980, 36 y.o.   MRN: [id number]  HPI  Chief Complaint  Patient presents with  . Foot Pain    Lt       Review of Systems  HENT: Positive for congestion and sneezing.   Eyes: Positive for redness.  Respiratory: Positive for cough and wheezing.   All other systems reviewed and are negative.      Objective:   Physical Exam        Assessment & Plan:

## 2017-04-04 NOTE — Progress Notes (Signed)
Subjective:    Patient ID: Molly Archer, female   DOB: 36 y.o.   MRN: [id number]   HPI patient presents stating that her left heel has been bothering her for several months and making it hard to be ambulatory. States she does not remember injury    Review of Systems  All other systems reviewed and are negative.       Objective:  Physical Exam  Constitutional: She is oriented to person, place, and time. She appears well-developed and well-nourished.  Cardiovascular: Intact distal pulses.   Neurological: She is alert and oriented to person, place, and time.  Skin: Skin is warm.  Nursing note and vitals reviewed.  neurovascular status found to be intact with muscle strength adequate range of motion within normal limits with patient noted to have quite a bit of discomfort in the plantar aspect left heel at the insertional point of the tendon into the calcaneus     Assessment:    Acute plan her fasciitis left with inflammation fluid of the medial band     Plan:    H&P condition reviewed and today injected the plantar fascial left 3 mg Kenalog 5 mill grams Xylocaine and applied fascial brace gave instructions on physical therapy  X-rays indicate small spur formation with no indication of stress fracture or arthritis

## 2017-04-24 ENCOUNTER — Encounter: Payer: Self-pay | Admitting: Podiatry

## 2017-04-24 ENCOUNTER — Ambulatory Visit (INDEPENDENT_AMBULATORY_CARE_PROVIDER_SITE_OTHER): Payer: BLUE CROSS/BLUE SHIELD | Admitting: Podiatry

## 2017-04-24 DIAGNOSIS — M722 Plantar fascial fibromatosis: Secondary | ICD-10-CM | POA: Diagnosis not present

## 2017-04-25 NOTE — Progress Notes (Signed)
Subjective:    Patient ID: Molly Archer, female   DOB: 36 y.o.   MRN: [id number]   HPI patient states she's having a lot of pain with her left foot and it makes it difficult for her to work or be on it    ROS      Objective:  Physical Exam neurovascular status intact with continued exquisite discomfort plantar aspect left heel and posterior left heel with inflammation fluid buildup noted     Assessment:    Acute plantar fasciitis plantar left heel     Plan:    Instructed on physical therapy anti-inflammatories and at this point I did dispense air fracture walker to completely immobilize the plantar heel and take all pressure off of it. Patient will be seen back to recheck

## 2017-05-29 ENCOUNTER — Ambulatory Visit: Payer: BLUE CROSS/BLUE SHIELD | Admitting: Podiatry

## 2017-07-11 ENCOUNTER — Telehealth: Payer: Self-pay

## 2017-07-11 NOTE — Telephone Encounter (Signed)
Pt called and stated that she believes she has had an allergic rxn because she has patches on her lower stomach that itch.  Looking in pt's chart, pt has not been seen in our office in 2 years.  I advised pt to try Benadryl otc and take it as instructed on the box. If that does not work to please go to Urgent Care for evaluation.  I asked pt if she has a PCP.  Pt stated "no".  I gave her information to [facility] so that she can call for an appt. Pt stated understanding with no further questions.

## 2017-07-25 ENCOUNTER — Other Ambulatory Visit: Payer: Self-pay

## 2017-07-25 ENCOUNTER — Encounter: Payer: Self-pay | Admitting: Physician Assistant

## 2017-07-25 ENCOUNTER — Ambulatory Visit (INDEPENDENT_AMBULATORY_CARE_PROVIDER_SITE_OTHER): Payer: BLUE CROSS/BLUE SHIELD | Admitting: Physician Assistant

## 2017-07-25 VITALS — BP 132/80 | HR 105 | Temp 98.4°F | Resp 18 | Ht 65.0 in | Wt 321.2 lb

## 2017-07-25 DIAGNOSIS — B372 Candidiasis of skin and nail: Secondary | ICD-10-CM

## 2017-07-25 DIAGNOSIS — R21 Rash and other nonspecific skin eruption: Secondary | ICD-10-CM | POA: Diagnosis not present

## 2017-07-25 LAB — GLUCOSE, POCT (MANUAL RESULT ENTRY): POC Glucose: 87 mg/dl (ref 70–99)

## 2017-07-25 MED ORDER — NYSTATIN 100000 UNIT/GM EX POWD: Freq: Three times a day (TID) | CUTANEOUS | 0 refills | Status: DC

## 2017-07-25 MED ORDER — FLUCONAZOLE 100 MG PO TABS: 100.0000 mg | ORAL_TABLET | ORAL | 0 refills | Status: AC

## 2017-07-25 NOTE — Progress Notes (Signed)
Molly Archer  MRN: [id number] DOB: January 23, 1981  Subjective:   Molly Archer is a 36 y.o. female who presents for evaluation of a rash involving the left lower trunk. Rash started 3 weeks ago. Lesions are red, and raised in texture. Rash has changed over time. Rash is pruritic. Pt denies abdominal pain, arthralgia, congestion, cough, crankiness, decrease in appetite, decrease in energy level, fever, headache, irritability, myalgia, nausea, sore throat and vomiting. She has tried some cortisone cream with little relief.. Patient has not had new exposures (soaps, lotions, laundry detergents, foods, medications, plants, insects or animals). No others at home with similar rash.   Review of Systems Per HPI  Patient Active Problem List   Diagnosis Date Noted  . DUB (dysfunctional uterine bleeding)   . Endometrial cancer determined by uterine biopsy ([place]) 01/14/2014  . Menometrorrhagia   . Amenorrhea 11/25/2013  . Obesity 09/08/2012  . HPV (human papilloma virus) infection 02/28/2012  . LSIL (low grade squamous intraepithelial lesion) on Pap smear 02/28/2012  . Hypertension 02/25/2012  . Hx of Bell's palsy 02/25/2012    Current Outpatient Medications on File Prior to Visit  Medication Sig Dispense Refill  . albuterol (PROVENTIL HFA;VENTOLIN HFA) 108 (90 BASE) MCG/ACT inhaler Inhale 2 puffs into the lungs every 4 (four) hours as needed for wheezing or shortness of breath. For cough/wheeze.    . diclofenac (VOLTAREN) 75 MG EC tablet Take 1 tablet (75 mg total) by mouth 2 (two) times daily. (Patient not taking: Reported on 07/25/2017) 50 tablet 2  . guaiFENesin (MUCINEX) 600 MG 12 hr tablet Take 600 mg by mouth 2 (two) times daily.    Marland Kitchen ibuprofen (ADVIL,MOTRIN) 200 MG tablet Take 400 mg by mouth every 6 (six) hours as needed for moderate pain.     No current facility-administered medications on file prior to visit.     Allergies  Allergen Reactions  . Codeine Itching  .  Peanut-Containing Drug Products Itching  . Penicillins Hives    Has patient had a PCN reaction causing immediate rash, facial/tongue/throat swelling, SOB or lightheadedness with hypotension: No Has patient had a PCN reaction causing severe rash involving mucus membranes or skin necrosis: No Has patient had a PCN reaction that required hospitalization No Has patient had a PCN reaction occurring within the last 10 years: No If all of the above answers are "NO", then may proceed with Cephalosporin use.   . Shellfish Allergy Itching     Objective:  BP 132/80   Pulse (!) 105   Temp 98.4 F (36.9 C) (Oral)   Resp 18   Ht 5\' 5"  (1.651 m)   Wt (!) 321 lb 3.2 oz (145.7 kg)   SpO2 98%   BMI 53.45 kg/m   Physical Exam  Constitutional: She is oriented to person, place, and time and well-developed, well-nourished, and in no distress.  HENT:  Head: Normocephalic and atraumatic.  Eyes: Conjunctivae are normal.  Neck: Normal range of motion.  Pulmonary/Chest: Effort normal.  Neurological: She is alert and oriented to person, place, and time. Gait normal.  Skin: Skin is warm and dry. Rash noted.  Large body habitus noted.   Erythematous macular rash with slight maceration of skin in left sided abdominal pannus, satellite lesions noted.    Psychiatric: Affect normal.  Vitals reviewed.  Results for orders placed or performed in visit on 07/25/17 (from the past 24 hour(s))  POCT glucose (manual entry)     Status: None   Collection Time:  07/25/17  3:21 PM  Result Value Ref Range   POC Glucose 87 70 - 99 mg/dl    Assessment and Plan :  1. Rash and nonspecific skin eruption - POCT glucose (manual entry) 2. Candidal intertrigo Physical exam findings consistent with candidal intertrigo. Normal POC glucose.  Will treat accordingly.  Patient encouraged to keep the affected area as dry as possible. Advised to return to clinic if symptoms worsen, do not improve, or as needed. - nystatin  (MYCOSTATIN/NYSTOP) powder; Apply topically 3 (three) times daily.  Dispense: 30 g; Refill: 0 - fluconazole (DIFLUCAN) 100 MG tablet; Take 1 tablet (100 mg total) by mouth once a week for 2 doses.  Dispense: 2 tablet; Refill: 0  Tenna Delaine PA-C  [facility] 07/25/2017 3:22 PM

## 2017-07-25 NOTE — Patient Instructions (Addendum)
Your sugar was normal at 87.  We are going to treat this as a yeast infection. I have given you both a topical and oral treatment. Please take as prescribed. Try to keep the affected area as dry as possible.  Make sure when you shower to pat the area dry and let it air out.  At night time I recommend avoiding tight fitting clothes and letting the area air out as well. Please return to clinic if symptoms worsen, do not improve, or as needed.   Skin Yeast Infection Skin yeast infection is a condition in which there is an overgrowth of yeast (candida) that normally lives on the skin. This condition usually occurs in areas of the skin that are constantly warm and moist, such as the armpits or the groin. What are the causes? This condition is caused by a change in the normal balance of the yeast and bacteria that live on the skin. What increases the risk? This condition is more likely to develop in:  People who are obese.  Pregnant women.  Women who take birth control pills.  People who have diabetes.  People who take antibiotic medicines.  People who take steroid medicines.  People who are malnourished.  People who have a weak defense (immune) system.  People who are 35 years of age or older.  What are the signs or symptoms? Symptoms of this condition include:  A red, swollen area of the skin.  Bumps on the skin.  Itchiness.  How is this diagnosed? This condition is diagnosed with a medical history and physical exam. Your health care provider may check for yeast by taking light scrapings of the skin to be viewed under a microscope. How is this treated? This condition is treated with medicine. Medicines may be prescribed or be available over-the-counter. The medicines may be:  Taken by mouth (orally).  Applied as a cream.  Follow these instructions at home:  Take or apply over-the-counter and prescription medicines only as told by your health care provider.  Eat more  yogurt. This may help to keep your yeast infection from returning.  Maintain a healthy weight. If you need help losing weight, talk with your health care provider.  Keep your skin clean and dry.  If you have diabetes, keep your blood sugar under control. Contact a health care provider if:  Your symptoms go away and then return.  Your symptoms do not get better with treatment.  Your symptoms get worse.  Your rash spreads.  You have a fever or chills.  You have new symptoms.  You have new warmth or redness of your skin. This information is not intended to replace advice given to you by your health care provider. Make sure you discuss any questions you have with your health care provider. Document Released: 04/03/2011 Document Revised: 03/11/2016 Document Reviewed: 01/17/2015 Elsevier Interactive Patient Education  2018 [employer].    IF you received an x-ray today, you will receive an invoice from [facility]. Please contact [facility] at [phone] with questions or concerns regarding your invoice.   IF you received labwork today, you will receive an invoice from [facility]. Please contact [facility] at [phone] with questions or concerns regarding your invoice.   Our billing staff will not be able to assist you with questions regarding bills from these companies.  You will be contacted with the lab results as soon as they are available. The fastest way to get your results is to activate your My Chart  account. Instructions are located on the last page of this paperwork. If you have not heard from [place] regarding the results in 2 weeks, please contact this office.

## 2017-07-25 NOTE — Progress Notes (Deleted)
Subjective:     Molly Archer is a 36 y.o. female who presents for evaluation of a rash involving the {body part:32401}. Rash started {1-10:13787} {time; units:19136} ago. Lesions are {color:5006}, and {text:16500} in texture. Rash {has/not:18111} changed over time. Rash {rash dc:16501}. Associated symptoms: {rash assoc:16502}. Patient denies: {rash assoc:19565}. Patient {has/not:18111} had contacts with similar rash. Patient {has/not:18111} had new exposures (soaps, lotions, laundry detergents, foods, medications, plants, insects or animals).  {Common ambulatory SmartLinks:19316}  Review of Systems {ros; complete:30496}    Objective:    BP 132/80   Pulse (!) 105   Temp 98.4 F (36.9 C) (Oral)   Resp 18   Ht 5\' 5"  (1.651 m)   Wt (!) 321 lb 3.2 oz (145.7 kg)   SpO2 98%   BMI 53.45 kg/m  General:  {gen appearance:16600}  Skin:  {skin exam:30902::"normal"}     Assessment:    {derm diagnosis:16511}    Plan:    {TCYE:18590}

## 2017-08-07 ENCOUNTER — Encounter: Payer: Self-pay | Admitting: Physician Assistant

## 2017-08-07 ENCOUNTER — Other Ambulatory Visit: Payer: Self-pay

## 2017-08-07 ENCOUNTER — Telehealth: Payer: Self-pay | Admitting: Physician Assistant

## 2017-08-07 ENCOUNTER — Ambulatory Visit (INDEPENDENT_AMBULATORY_CARE_PROVIDER_SITE_OTHER): Payer: BLUE CROSS/BLUE SHIELD | Admitting: Physician Assistant

## 2017-08-07 VITALS — BP 170/82 | HR 98 | Temp 99.2°F | Ht 65.0 in | Wt 324.4 lb

## 2017-08-07 DIAGNOSIS — R6889 Other general symptoms and signs: Secondary | ICD-10-CM | POA: Diagnosis not present

## 2017-08-07 DIAGNOSIS — I10 Essential (primary) hypertension: Secondary | ICD-10-CM

## 2017-08-07 DIAGNOSIS — B369 Superficial mycosis, unspecified: Secondary | ICD-10-CM | POA: Diagnosis not present

## 2017-08-07 DIAGNOSIS — J101 Influenza due to other identified influenza virus with other respiratory manifestations: Secondary | ICD-10-CM | POA: Diagnosis not present

## 2017-08-07 DIAGNOSIS — R21 Rash and other nonspecific skin eruption: Secondary | ICD-10-CM

## 2017-08-07 LAB — POC INFLUENZA A&B (BINAX/QUICKVUE)
Influenza A, POC: POSITIVE — AB
Influenza B, POC: NEGATIVE

## 2017-08-07 LAB — POCT SKIN KOH: Skin KOH, POC: NEGATIVE

## 2017-08-07 MED ORDER — OSELTAMIVIR PHOSPHATE 75 MG PO CAPS: 75.0000 mg | ORAL_CAPSULE | Freq: Two times a day (BID) | ORAL | 0 refills | Status: DC

## 2017-08-07 MED ORDER — ALBUTEROL SULFATE HFA 108 (90 BASE) MCG/ACT IN AERS: 2.0000 | INHALATION_SPRAY | RESPIRATORY_TRACT | 1 refills | Status: DC | PRN

## 2017-08-07 MED ORDER — TERBINAFINE HCL 250 MG PO TABS: 250.0000 mg | ORAL_TABLET | Freq: Every day | ORAL | 0 refills | Status: DC

## 2017-08-07 MED ORDER — BENZONATATE 100 MG PO CAPS: 100.0000 mg | ORAL_CAPSULE | Freq: Three times a day (TID) | ORAL | 0 refills | Status: DC | PRN

## 2017-08-07 MED ORDER — AMLODIPINE BESYLATE 5 MG PO TABS
5.0000 mg | ORAL_TABLET | Freq: Every day | ORAL | 3 refills | Status: DC
Start: 2017-08-07 — End: 2018-08-11

## 2017-08-07 NOTE — Progress Notes (Deleted)
PRIMARY CARE AT [place], [place] [address] [phone]  Date:  08/07/2017   Name:  Molly Archer   DOB:  Jun 03, 1981   MRN:  [id number]  PCP:  System, Pcp Not In    History of Present Illness:  Molly Archer is a 37 y.o. female patient who presents to PCP with  Chief Complaint  Patient presents with  . Cough    FLU LIKE SYMPTOMS     5 days ago, she developed a cough.  The next day fever, 100.8.  Her temperature stayed normal yesterday. She has mild sore throat secondary to cough.  She is nto able to sleep.  Lays down and feels like she is suffocating.  Productive cough is white.  She has new sneezing yesterday. She feels winded with normal activity. She was taking advil to no avail.   No extremity swelling.   She also complains of itchy bump under her stomach 2 weeks ago.  She gave her a powder and pills.  She has it on both sides at this time.  No pain.   Patient Active Problem List   Diagnosis Date Noted  . DUB (dysfunctional uterine bleeding)   . Endometrial cancer determined by uterine biopsy (Molly Archer) 01/14/2014  . Menometrorrhagia   . Amenorrhea 11/25/2013  . Obesity 09/08/2012  . HPV (human papilloma virus) infection 02/28/2012  . LSIL (low grade squamous intraepithelial lesion) on Pap smear 02/28/2012  . Hypertension 02/25/2012  . Hx of Bell's palsy 02/25/2012    Past Medical History:  Diagnosis Date  . Anemia   . Asthma    only uses inhaler in fall and spring  . GERD (gastroesophageal reflux disease)    diet controlled, occ. uses omeprazole  . Headache    otc med prn  . History of kidney stones   . Hx of Bell's palsy June 2013  . Hypertension    currently no meds  . Menometrorrhagia   . Psoriasis   . Shortness of breath dyspnea    with exercise/exertion    Past Surgical History:  Procedure Laterality Date  . COMBINED HYSTEROSCOPY DIAGNOSTIC / D&C N/A 2011   Benign secretory endometrium  . CYSTOSCOPY WITH STENT PLACEMENT Left  10/19/2014   Procedure: CYSTOSCOPY WITH STENT PLACEMENT;  Surgeon: Ardis Hughs, MD;  Location: [facility];  Service: Urology;  Laterality: Left;  . DILATION AND CURETTAGE OF UTERUS  10/2003   Polyp, mild atypia, simple and complex hyperplasia,  . DILATION AND CURETTAGE OF UTERUS  11/2003   Simple and complex hyperplasia  . DILATION AND CURETTAGE OF UTERUS  2011  . DILATION AND CURETTAGE OF UTERUS N/A 12/14/2014   Procedure: DILATATION AND CURETTAGE;  Surgeon: Mora Bellman, MD;  Location: [facility];  Service: Gynecology;  Laterality: N/A;  . HOLMIUM LASER APPLICATION Left 0/35/4656   Procedure: HOLMIUM LASER APPLICATION;  Surgeon: Ardis Hughs, MD;  Location: [facility];  Service: Urology;  Laterality: Left;  . INTRAUTERINE DEVICE (IUD) INSERTION N/A 12/14/2014   Procedure: INTRAUTERINE DEVICE (IUD) INSERTION;  Surgeon: Mora Bellman, MD;  Location: [facility];  Service: Gynecology;  Laterality: N/A;  . STONE EXTRACTION WITH BASKET Left 10/19/2014   Procedure: STONE EXTRACTION WITH BASKET;  Surgeon: Ardis Hughs, MD;  Location: [facility];  Service: Urology;  Laterality: Left;  . WISDOM TOOTH EXTRACTION      Social History   Tobacco Use  . Smoking status: Never Smoker  . Smokeless tobacco: Never Used  Substance  Use Topics  . Alcohol use: No  . Drug use: No    Family History  Problem Relation Age of Onset  . Hypertension Other   . Diabetes Other   . Cancer Other     Allergies  Allergen Reactions  . Codeine Itching  . Peanut-Containing Drug Products Itching  . Penicillins Hives    Has patient had a PCN reaction causing immediate rash, facial/tongue/throat swelling, SOB or lightheadedness with hypotension: No Has patient had a PCN reaction causing severe rash involving mucus membranes or skin necrosis: No Has patient had a PCN reaction that required hospitalization No Has patient had a PCN reaction occurring within the last 10 years: No If all of the above answers are "NO", then  may proceed with Cephalosporin use.   . Shellfish Allergy Itching    Medication list has been reviewed and updated.  Current Outpatient Medications on File Prior to Visit  Medication Sig Dispense Refill  . albuterol (PROVENTIL HFA;VENTOLIN HFA) 108 (90 BASE) MCG/ACT inhaler Inhale 2 puffs into the lungs every 4 (four) hours as needed for wheezing or shortness of breath. For cough/wheeze.    Marland Kitchen ibuprofen (ADVIL,MOTRIN) 200 MG tablet Take 400 mg by mouth every 6 (six) hours as needed for moderate pain.    Marland Kitchen nystatin (MYCOSTATIN/NYSTOP) powder Apply topically 3 (three) times daily. 30 g 0  . diclofenac (VOLTAREN) 75 MG EC tablet Take 1 tablet (75 mg total) by mouth 2 (two) times daily. (Patient not taking: Reported on 07/25/2017) 50 tablet 2  . guaiFENesin (MUCINEX) 600 MG 12 hr tablet Take 600 mg by mouth 2 (two) times daily.     No current facility-administered medications on file prior to visit.     ROS ROS otherwise unremarkable unless listed above.  Physical Examination: BP (!) 170/82 (BP Location: Left Arm, Patient Position: Sitting, Cuff Size: Large)   Pulse 98   Temp 99.2 F (37.3 C) (Oral)   Ht 5\' 5"  (1.651 m)   Wt (!) 324 lb 6.4 oz (147.1 kg)   SpO2 98%   BMI 53.98 kg/m  Ideal Body Weight: Weight in (lb) to have BMI = 25: 149.9  Physical Exam   Assessment and Plan: Molly Archer is a 37 y.o. female who is here today  1. Flu-like symptoms *** - POC Influenza A&B(BINAX/QUICKVUE)   Ivar Drape, PA-C [facility] 08/07/2017 10:01 AM

## 2017-08-07 NOTE — Patient Instructions (Addendum)
Please use the albuterol every 6 hours routinely for the next 24 hours.   I would like you to start the terbinafine after 1 week, following the tamiflu. You can start the amlodipine at this time.   We will follow up in 2 weeks.    Influenza, Adult Influenza ("the flu") is an infection in the lungs, nose, and throat (respiratory tract). It is caused by a virus. The flu causes many common cold symptoms, as well as a high fever and body aches. It can make you feel very sick. The flu spreads easily from person to person (is contagious). Getting a flu shot (influenza vaccination) every year is the best way to prevent the flu. Follow these instructions at home:  Take over-the-counter and prescription medicines only as told by your doctor.  Use a cool mist humidifier to add moisture (humidity) to the air in your home. This can make it easier to breathe.  Rest as needed.  Drink enough fluid to keep your pee (urine) clear or pale yellow.  Cover your mouth and nose when you cough or sneeze.  Wash your hands with soap and water often, especially after you cough or sneeze. If you cannot use soap and water, use hand sanitizer.  Stay home from work or school as told by your doctor. Unless you are visiting your doctor, try to avoid leaving home until your fever has been gone for 24 hours without the use of medicine.  Keep all follow-up visits as told by your doctor. This is important. How is this prevented?  Getting a yearly (annual) flu shot is the best way to avoid getting the flu. You may get the flu shot in late summer, fall, or winter. Ask your doctor when you should get your flu shot.  Wash your hands often or use hand sanitizer often.  Avoid contact with people who are sick during cold and flu season.  Eat healthy foods.  Drink plenty of fluids.  Get enough sleep.  Exercise regularly. Contact a doctor if:  You get new symptoms.  You have: ? Chest pain. ? Watery poop  (diarrhea). ? A fever.  Your cough gets worse.  You start to have more mucus.  You feel sick to your stomach (nauseous).  You throw up (vomit). Get help right away if:  You start to be short of breath or have trouble breathing.  Your skin or nails turn a bluish color.  You have very bad pain or stiffness in your neck.  You get a sudden headache.  You get sudden pain in your face or ear.  You cannot stop throwing up. This information is not intended to replace advice given to you by your health care provider. Make sure you discuss any questions you have with your health care provider. Document Released: 04/24/2008 Document Revised: 12/22/2015 Document Reviewed: 05/10/2015 Elsevier Interactive Patient Education  2017 [employer].    IF you received an x-ray today, you will receive an invoice from [facility]. Please contact [facility] at [phone] with questions or concerns regarding your invoice.   IF you received labwork today, you will receive an invoice from [facility]. Please contact [facility] at [phone] with questions or concerns regarding your invoice.   Our billing staff will not be able to assist you with questions regarding bills from these companies.  You will be contacted with the lab results as soon as they are available. The fastest way to get your results is to activate your My Chart  account. Instructions are located on the last page of this paperwork. If you have not heard from [place] regarding the results in 2 weeks, please contact this office.

## 2017-08-07 NOTE — Telephone Encounter (Signed)
Copied from [place] [id number]. Topic: Quick Communication - See Telephone Encounter >> Aug 07, 2017 11:51 AM Oneta Rack wrote: CRM for notification. See Telephone encounter for:   08/07/17.   Relation to GU:YQIH  Call back number:[phone]   Reason for call:  Patient was seen today and states she didn't realize until she left the office, benzonatate (TESSALON) 100 MG capsule was prescribed, patient states she has used this medication in the past and it doesn't work it acutally makes the cough worse and would like alternate Rx, please advise

## 2017-08-07 NOTE — Progress Notes (Signed)
PRIMARY CARE AT [place], [place] [address] [phone]  Date:  08/07/2017   Name:  Molly Archer   DOB:  04-08-1981   MRN:  [id number]  PCP:  System, Pcp Not In    History of Present Illness:  Molly Archer is a 37 y.o. female patient who presents to PCP with  Chief Complaint  Patient presents with  . Cough    FLU LIKE SYMPTOMS     5 days ago, she developed a cough.  The next day fever, 100.8.  Her temperature stayed normal yesterday. She has mild sore throat secondary to cough.  She is nto able to sleep.  Lays down and feels like she is suffocating.  Productive cough is white.  She has new sneezing yesterday. She feels winded with normal activity. She was taking advil to no avail.   No extremity swelling.     Patient Active Problem List   Diagnosis Date Noted  . DUB (dysfunctional uterine bleeding)   . Endometrial cancer determined by uterine biopsy ([facility]) 01/14/2014  . Menometrorrhagia   . Amenorrhea 11/25/2013  . Obesity 09/08/2012  . HPV (human papilloma virus) infection 02/28/2012  . LSIL (low grade squamous intraepithelial lesion) on Pap smear 02/28/2012  . Hypertension 02/25/2012  . Hx of Bell's palsy 02/25/2012    Past Medical History:  Diagnosis Date  . Anemia   . Asthma    only uses inhaler in fall and spring  . GERD (gastroesophageal reflux disease)    diet controlled, occ. uses omeprazole  . Headache    otc med prn  . History of kidney stones   . Hx of Bell's palsy June 2013  . Hypertension    currently no meds  . Menometrorrhagia   . Psoriasis   . Shortness of breath dyspnea    with exercise/exertion    Past Surgical History:  Procedure Laterality Date  . COMBINED HYSTEROSCOPY DIAGNOSTIC / D&C N/A 2011   Benign secretory endometrium  . CYSTOSCOPY WITH STENT PLACEMENT Left 10/19/2014   Procedure: CYSTOSCOPY WITH STENT PLACEMENT;  Surgeon: Ardis Hughs, MD;  Location: [facility];  Service: Urology;  Laterality: Left;   . DILATION AND CURETTAGE OF UTERUS  10/2003   Polyp, mild atypia, simple and complex hyperplasia,  . DILATION AND CURETTAGE OF UTERUS  11/2003   Simple and complex hyperplasia  . DILATION AND CURETTAGE OF UTERUS  2011  . DILATION AND CURETTAGE OF UTERUS N/A 12/14/2014   Procedure: DILATATION AND CURETTAGE;  Surgeon: Mora Bellman, MD;  Location: [facility];  Service: Gynecology;  Laterality: N/A;  . HOLMIUM LASER APPLICATION Left 1/70/0174   Procedure: HOLMIUM LASER APPLICATION;  Surgeon: Ardis Hughs, MD;  Location: [facility];  Service: Urology;  Laterality: Left;  . INTRAUTERINE DEVICE (IUD) INSERTION N/A 12/14/2014   Procedure: INTRAUTERINE DEVICE (IUD) INSERTION;  Surgeon: Mora Bellman, MD;  Location: [facility];  Service: Gynecology;  Laterality: N/A;  . STONE EXTRACTION WITH BASKET Left 10/19/2014   Procedure: STONE EXTRACTION WITH BASKET;  Surgeon: Ardis Hughs, MD;  Location: [facility];  Service: Urology;  Laterality: Left;  . WISDOM TOOTH EXTRACTION      Social History   Tobacco Use  . Smoking status: Never Smoker  . Smokeless tobacco: Never Used  Substance Use Topics  . Alcohol use: No  . Drug use: No    Family History  Problem Relation Age of Onset  . Hypertension Other   . Diabetes Other   .  Cancer Other     Allergies  Allergen Reactions  . Codeine Itching  . Peanut-Containing Drug Products Itching  . Penicillins Hives    Has patient had a PCN reaction causing immediate rash, facial/tongue/throat swelling, SOB or lightheadedness with hypotension: No Has patient had a PCN reaction causing severe rash involving mucus membranes or skin necrosis: No Has patient had a PCN reaction that required hospitalization No Has patient had a PCN reaction occurring within the last 10 years: No If all of the above answers are "NO", then may proceed with Cephalosporin use.   . Shellfish Allergy Itching    Medication list has been reviewed and updated.  Current Outpatient  Medications on File Prior to Visit  Medication Sig Dispense Refill  . albuterol (PROVENTIL HFA;VENTOLIN HFA) 108 (90 BASE) MCG/ACT inhaler Inhale 2 puffs into the lungs every 4 (four) hours as needed for wheezing or shortness of breath. For cough/wheeze.    Marland Kitchen ibuprofen (ADVIL,MOTRIN) 200 MG tablet Take 400 mg by mouth every 6 (six) hours as needed for moderate pain.    Marland Kitchen nystatin (MYCOSTATIN/NYSTOP) powder Apply topically 3 (three) times daily. 30 g 0  . diclofenac (VOLTAREN) 75 MG EC tablet Take 1 tablet (75 mg total) by mouth 2 (two) times daily. (Patient not taking: Reported on 07/25/2017) 50 tablet 2  . guaiFENesin (MUCINEX) 600 MG 12 hr tablet Take 600 mg by mouth 2 (two) times daily.     No current facility-administered medications on file prior to visit.     ROS ROS otherwise unremarkable unless listed above.  Physical Examination: BP (!) 170/82 (BP Location: Left Arm, Patient Position: Sitting, Cuff Size: Large)   Pulse 98   Temp 99.2 F (37.3 C) (Oral)   Ht 5\' 5"  (1.651 m)   Wt (!) 324 lb 6.4 oz (147.1 kg)   SpO2 98%   BMI 53.98 kg/m  Ideal Body Weight: Weight in (lb) to have BMI = 25: 149.9  Physical Exam  Constitutional: She is oriented to person, place, and time. She appears well-developed and well-nourished. No distress.  HENT:  Head: Normocephalic and atraumatic.  Right Ear: Tympanic membrane, external ear and ear canal normal.  Left Ear: Tympanic membrane, external ear and ear canal normal.  Nose: Mucosal edema and rhinorrhea present. Right sinus exhibits no maxillary sinus tenderness and no frontal sinus tenderness. Left sinus exhibits no maxillary sinus tenderness and no frontal sinus tenderness.  Mouth/Throat: No uvula swelling. No oropharyngeal exudate, posterior oropharyngeal edema or posterior oropharyngeal erythema.  Eyes: Conjunctivae and EOM are normal. Pupils are equal, round, and reactive to light.  Cardiovascular: Normal rate and regular rhythm. Exam  reveals no gallop, no distant heart sounds and no friction rub.  No murmur heard. Pulmonary/Chest: Effort normal. No respiratory distress. She has no decreased breath sounds. She has wheezes (mild expiratory wheezing). She has no rhonchi.  Lymphadenopathy:       Head (right side): No submandibular, no tonsillar, no preauricular and no posterior auricular adenopathy present.       Head (left side): No submandibular, no tonsillar, no preauricular and no posterior auricular adenopathy present.  Neurological: She is alert and oriented to person, place, and time.  Skin: She is not diaphoretic.  Scant erythematous papular rash along the abdominal fold.  Some scant papules are circular in formation.  Psychiatric: She has a normal mood and affect. Her behavior is normal.   Results for orders placed or performed in visit on 08/07/17  POC Influenza A&B(BINAX/QUICKVUE)  Result Value Ref Range   Influenza A, POC Positive (A) Negative   Influenza B, POC Negative Negative  POCT Skin KOH  Result Value Ref Range   Skin KOH, POC Negative Negative     Assessment and Plan: LEONTINA SKIDMORE is a 37 y.o. female who is here today for cc of  Chief Complaint  Patient presents with  . Cough    FLU LIKE SYMPTOMS  --this does appear to be fungal.   Patient instruction: Please use the albuterol every 6 hours routinely for the next 24 hours.   I would like you to start the terbinafine after 1 week, following the tamiflu. You can start the amlodipine at this time.   We will follow up in 2 weeks for bp recheck and physical exam.   Fungal rash of trunk - Plan: terbinafine (LAMISIL) 250 MG tablet  Flu-like symptoms - Plan: POC Influenza A&B(BINAX/QUICKVUE), oseltamivir (TAMIFLU) 75 MG capsule  Rash and nonspecific skin eruption - Plan: POCT Skin KOH, amLODipine (NORVASC) 5 MG tablet  Ivar Drape, PA-C [facility] 1/9/201910:27 AM

## 2017-08-09 NOTE — Telephone Encounter (Signed)
Msg sent to [facility] priority

## 2017-08-10 NOTE — Telephone Encounter (Signed)
Pt notified and verbalized understanding.

## 2017-08-10 NOTE — Telephone Encounter (Signed)
Looks like you may have an allergy to the hydrocodone which is in a lot of the prescripted cough medicines. Use delsym over the counter

## 2017-11-06 ENCOUNTER — Encounter: Payer: Self-pay | Admitting: Physician Assistant

## 2017-11-09 ENCOUNTER — Other Ambulatory Visit: Payer: Self-pay

## 2017-11-09 ENCOUNTER — Ambulatory Visit: Payer: BLUE CROSS/BLUE SHIELD | Admitting: Physician Assistant

## 2017-11-09 ENCOUNTER — Encounter: Payer: Self-pay | Admitting: Physician Assistant

## 2017-11-09 VITALS — BP 164/99 | HR 77 | Temp 98.6°F | Resp 16 | Ht 64.5 in | Wt 341.2 lb

## 2017-11-09 DIAGNOSIS — H109 Unspecified conjunctivitis: Secondary | ICD-10-CM | POA: Diagnosis not present

## 2017-11-09 MED ORDER — TOBRAMYCIN-DEXAMETHASONE 0.3-0.1 % OP SUSP: 2.0000 [drp] | OPHTHALMIC | 0 refills | Status: DC

## 2017-11-09 NOTE — Progress Notes (Signed)
Molly Archer  MRN: [id number] DOB: 09-17-1980  PCP: System, Pcp Not In  Subjective:  Pt is a 37 year old female who presents to clinic for eye problem. Right eye is red, itchy. C/o yellow discharge and mildly blurry vision. HA yesterday - this has resolved.  This happens to her about every other month - she sees ophthalmologist on a regular basis for this problem. Last time she was Rx tobradex, which helped.  Denies pain and swelling of area around her eye.   Review of Systems  Constitutional: Negative for chills and fever.  Eyes: Positive for pain, discharge, redness, itching and visual disturbance. Negative for photophobia.  Gastrointestinal: Negative for nausea and vomiting.  Neurological: Positive for headaches. Negative for dizziness, syncope and weakness.    Patient Active Problem List   Diagnosis Date Noted  . DUB (dysfunctional uterine bleeding)   . Endometrial cancer determined by uterine biopsy ([name]) 01/14/2014  . Menometrorrhagia   . Amenorrhea 11/25/2013  . Obesity 09/08/2012  . HPV (human papilloma virus) infection 02/28/2012  . LSIL (low grade squamous intraepithelial lesion) on Pap smear 02/28/2012  . Hypertension 02/25/2012  . Hx of Bell's palsy 02/25/2012    Current Outpatient Medications on File Prior to Visit  Medication Sig Dispense Refill  . albuterol (PROVENTIL HFA;VENTOLIN HFA) 108 (90 Base) MCG/ACT inhaler Inhale 2 puffs into the lungs every 4 (four) hours as needed for wheezing or shortness of breath (cough, shortness of breath or wheezing.). 1 Inhaler 1  . amLODipine (NORVASC) 5 MG tablet Take 1 tablet (5 mg total) by mouth daily. (Patient not taking: Reported on 11/09/2017) 90 tablet 3  . diclofenac (VOLTAREN) 75 MG EC tablet Take 1 tablet (75 mg total) by mouth 2 (two) times daily. (Patient not taking: Reported on 07/25/2017) 50 tablet 2  . ibuprofen (ADVIL,MOTRIN) 200 MG tablet Take 400 mg by mouth every 6 (six) hours as needed for moderate pain.     Marland Kitchen nystatin (MYCOSTATIN/NYSTOP) powder Apply topically 3 (three) times daily. (Patient not taking: Reported on 11/09/2017) 30 g 0  . terbinafine (LAMISIL) 250 MG tablet Take 1 tablet (250 mg total) by mouth daily. (Patient not taking: Reported on 11/09/2017) 14 tablet 0   No current facility-administered medications on file prior to visit.     Allergies  Allergen Reactions  . Codeine Itching  . Peanut-Containing Drug Products Itching  . Penicillins Hives    Has patient had a PCN reaction causing immediate rash, facial/tongue/throat swelling, SOB or lightheadedness with hypotension: No Has patient had a PCN reaction causing severe rash involving mucus membranes or skin necrosis: No Has patient had a PCN reaction that required hospitalization No Has patient had a PCN reaction occurring within the last 10 years: No If all of the above answers are "NO", then may proceed with Cephalosporin use.   . Shellfish Allergy Itching     Objective:  BP (!) 146/91   Pulse 77   Temp 98.6 F (37 C) (Oral)   Resp 16   Ht 5' 4.5" (1.638 m)   Wt (!) 341 lb 3.2 oz (154.8 kg)   LMP 11/12/2014   SpO2 100%   BMI 57.66 kg/m   Physical Exam  Constitutional: She is oriented to person, place, and time. No distress.  Eyes: Pupils are equal, round, and reactive to light. EOM are normal. Right conjunctiva is injected.  Watery and mild edema. No surrounding cellulitis.   Neurological: She is alert and oriented to person, place,  and time.  Skin: Skin is warm and dry.  Psychiatric: Judgment normal.  Vitals reviewed.   Assessment and Plan :  1. Bacterial conjunctivitis - tobramycin-dexamethasone (TOBRADEX) ophthalmic solution; Place 2 drops into the right eye every 4 (four) hours while awake.  Dispense: 5 mL; Refill: 0 - Pt presents with eye redness and discharge x 1 day. Suspect bacterial conjunctivitis. Plan to treat with tobradex. F/u with ophthalmologist if needed or RTC in 3 days if symptoms are not  improving.    Mercer Pod, PA-C  [facility] at [facility] 11/09/2017 4:05 PM

## 2017-11-09 NOTE — Patient Instructions (Addendum)
Do not wear contacts for the next 1-2 weeks if you wear them  Apply TobraDex: Instill 1 to 2 drops into the conjunctival sac of the affected eye(s) every 4 to 6 hours;  Come back or see your eye doctor if you are not improving in 3-5 days.    Bacterial Conjunctivitis Bacterial conjunctivitis is an infection of the clear membrane that covers the white part of your eye and the inner surface of your eyelid (conjunctiva). When the blood vessels in your conjunctiva become inflamed, your eye becomes red or pink, and it will probably feel itchy. Bacterial conjunctivitis spreads very easily from person to person (is contagious). It also spreads easily from one eye to the other eye. What are the causes? This condition is caused by several common bacteria. You may get the infection if you come into close contact with another person who is infected. You may also come into contact with items that are contaminated with the bacteria, such as a face towel, contact lens solution, or eye makeup. What increases the risk? This condition is more likely to develop in people who:  Are exposed to other people who have the infection.  Wear contact lenses.  Have a sinus infection.  Have had a recent eye injury or surgery.  Have a weak body defense system (immune system).  Have a medical condition that causes dry eyes.  What are the signs or symptoms? Symptoms of this condition include:  Eye redness.  Tearing or watery eyes.  Itchy eyes.  Burning feeling in your eyes.  Thick, yellowish discharge from an eye. This may turn into a crust on the eyelid overnight and cause your eyelids to stick together.  Swollen eyelids.  Blurred vision.  How is this diagnosed? Your health care provider can diagnose this condition based on your symptoms and medical history. Your health care provider may also take a sample of discharge from your eye to find the cause of your infection. This is rarely done. How is this  treated? Treatment for this condition includes:  Antibiotic eye drops or ointment to clear the infection more quickly and prevent the spread of infection to others.  Oral antibiotic medicines to treat infections that do not respond to drops or ointments, or last longer than 10 days.  Cool, wet cloths (cool compresses) placed on the eyes.  Artificial tears applied 2-6 times a day.  Follow these instructions at home: Medicines  Take or apply your antibiotic medicine as told by your health care provider. Do not stop taking or applying the antibiotic even if you start to feel better.  Take or apply over-the-counter and prescription medicines only as told by your health care provider.  Be very careful to avoid touching the edge of your eyelid with the eye drop bottle or the ointment tube when you apply medicines to the affected eye. This will keep you from spreading the infection to your other eye or to other people. Managing discomfort  Gently wipe away any drainage from your eye with a warm, wet washcloth or a cotton ball.  Apply a cool, clean washcloth to your eye for 10-20 minutes, 3-4 times a day. General instructions  Do not wear contact lenses until the inflammation is gone and your health care provider says it is safe to wear them again. Ask your health care provider how to sterilize or replace your contact lenses before you use them again. Wear glasses until you can resume wearing contacts.  Avoid wearing eye makeup  until the inflammation is gone. Throw away any old eye cosmetics that may be contaminated.  Change or wash your pillowcase every day.  Do not share towels or washcloths. This may spread the infection.  Wash your hands often with soap and water. Use paper towels to dry your hands.  Avoid touching or rubbing your eyes.  Do not drive or use heavy machinery if your vision is blurred. Contact a health care provider if:  You have a fever.  Your symptoms do not get  better after 10 days. Get help right away if:  You have a fever and your symptoms suddenly get worse.  You have severe pain when you move your eye.  You have facial pain, redness, or swelling.  You have sudden loss of vision. This information is not intended to replace advice given to you by your health care provider. Make sure you discuss any questions you have with your health care provider. Document Released: 07/16/2005 Document Revised: 11/24/2015 Document Reviewed: 04/28/2015 Elsevier Interactive Patient Education  2017 [place] you for coming in today. I hope you feel we met your needs.  Feel free to call PCP if you have any questions or further requests.  Please consider signing up for MyChart if you do not already have it, as this is a great way to communicate with me.  Best,  Whitney McVey, PA-C   IF you received an x-ray today, you will receive an invoice from [facility]. Please contact [facility] at [phone] with questions or concerns regarding your invoice.   IF you received labwork today, you will receive an invoice from [facility]. Please contact [facility] at [phone] with questions or concerns regarding your invoice.   Our billing staff will not be able to assist you with questions regarding bills from these companies.  You will be contacted with the lab results as soon as they are available. The fastest way to get your results is to activate your My Chart account. Instructions are located on the last page of this paperwork. If you have not heard from [place] regarding the results in 2 weeks, please contact this office.

## 2018-04-16 NOTE — Progress Notes (Signed)
Molly Archer  MRN: [id number] DOB: 1980-10-27  Subjective:  Pt is a 37 y.o. female who presents for annual physical exam and completion of work form.   Diet: "not healthy" eats lots of fried chicken, fish, rice, beef. No vegetables. Drinks water. Stopped juices. Some cheese. No milk. Exercise: Walks sometimes. Sleep: 4-5 hrs  Menses: Since having IUD taken out, cycles are irregular, every 4-6 weeks, they can be very heavy. Followed by [facility] gyn, has not seen them in a while. LMP 02/28/18. BM: Daily  Last dental exam: 2018 Last vision exam: 2018 Last pap smear: ~3 yrs ago, followed by [facility]  Vaccinations      Tetanus: within 10 years       Flu: Years ago      Not curerntly taking medication for HTN. Does not check regularly. Has amlodpine 18m at home.     Patient Active Problem List   Diagnosis Date Noted  . DUB (dysfunctional uterine bleeding)   . [place]rial [place]  . Menometrorrhagia   . Amenorrhea 11/25/2013  . Obesity 09/08/2012  . HPV (human papilloma virus) infection 02/28/2012  . LSIL (low grade squamous intraepithelial lesion) on Pap smear 02/28/2012  . Hypertension 02/25/2012  . Hx of Bell's palsy 02/25/2012    Current Outpatient Medications on File Prior to Visit  Medication Sig Dispense Refill  . albuterol (PROVENTIL HFA;VENTOLIN HFA) 108 (90 Base) MCG/ACT inhaler Inhale 2 puffs into the lungs every 4 (four) hours as needed for wheezing or shortness of breath (cough, shortness of breath or wheezing.). 1 Inhaler 1  . nystatin (MYCOSTATIN/NYSTOP) powder Apply topically 3 (three) times daily. 30 g 0  . amLODipine (NORVASC) 5 MG tablet Take 1 tablet (5 mg total) by mouth daily. (Patient not taking: Reported on 11/09/2017) 90 tablet 3  . ibuprofen (ADVIL,MOTRIN) 200 MG tablet Take 400 mg by mouth every 6 (six) hours as needed for moderate pain.     No current facility-administered medications on file prior  to visit.     Allergies  Allergen Reactions  . Codeine Itching  . Peanut-Containing Drug Products Itching  . Penicillins Hives    Has patient had a PCN reaction causing immediate rash, facial/tongue/throat swelling, SOB or lightheadedness with hypotension: No Has patient had a PCN reaction causing severe rash involving mucus membranes or skin necrosis: No Has patient had a PCN reaction that required hospitalization No Has patient had a PCN reaction occurring within the last 10 years: No If all of the above answers are "NO", then may proceed with Cephalosporin use.   . Shellfish Allergy Itching    Social History   Socioeconomic History  . Marital status: Single    Spouse name: Not on file  . Number of children: Not on file  . Years of education: Not on file  . Highest education level: Not on file  Occupational History  . Not on file  Social Needs  . Financial resource strain: Not very hard  . Food insecurity:    Worry: Never true    Inability: Never true  . Transportation needs:    Medical: No    Non-medical: No  Tobacco Use  . Smoking status: Never Smoker  . Smokeless tobacco: Never Used  Substance and Sexual Activity  . Alcohol use: No  . Drug use: No  . Sexual activity: Not Currently    Birth control/protection: None  Lifestyle  . Physical activity:    Days per  week: 0 days    Minutes per session: 0 min  . Stress: Not at all  Relationships  . Social connections:    Talks on phone: More than three times a week    Gets together: Once a week    Attends religious service: More than 4 times per year    Active member of club or organization: Yes    Attends meetings of clubs or organizations: More than 4 times per year    Relationship status: Never married  Other Topics Concern  . Not on file  Social History Narrative   She is from [place], [place], moved here 1998.    Lives at home by herself.     Past Surgical History:  Procedure Laterality Date  . COMBINED  HYSTEROSCOPY DIAGNOSTIC / D&C N/A 2011   Benign secretory endometrium  . CYSTOSCOPY WITH STENT PLACEMENT Left 10/19/2014   Procedure: CYSTOSCOPY WITH STENT PLACEMENT;  Surgeon: Ardis Hughs, MD;  Location: [facility];  Service: Urology;  Laterality: Left;  . DILATION AND CURETTAGE OF UTERUS  10/2003   Polyp, mild atypia, simple and complex hyperplasia,  . DILATION AND CURETTAGE OF UTERUS  11/2003   Simple and complex hyperplasia  . DILATION AND CURETTAGE OF UTERUS  2011  . DILATION AND CURETTAGE OF UTERUS N/A 12/14/2014   Procedure: DILATATION AND CURETTAGE;  Surgeon: Mora Bellman, MD;  Location: [facility];  Service: Gynecology;  Laterality: N/A;  . HOLMIUM LASER APPLICATION Left 7/56/4332   Procedure: HOLMIUM LASER APPLICATION;  Surgeon: Ardis Hughs, MD;  Location: [facility];  Service: Urology;  Laterality: Left;  . INTRAUTERINE DEVICE (IUD) INSERTION N/A 12/14/2014   Procedure: INTRAUTERINE DEVICE (IUD) INSERTION;  Surgeon: Mora Bellman, MD;  Location: [facility];  Service: Gynecology;  Laterality: N/A;  . STONE EXTRACTION WITH BASKET Left 10/19/2014   Procedure: STONE EXTRACTION WITH BASKET;  Surgeon: Ardis Hughs, MD;  Location: [facility];  Service: Urology;  Laterality: Left;  . WISDOM TOOTH EXTRACTION      Family History  Problem Relation Age of Onset  . Hypertension Other   . Diabetes Other   . Cancer Other     Review of Systems  Constitutional: Negative for activity change, appetite change, chills, diaphoresis, fatigue, fever and unexpected weight change.  HENT: Negative for congestion, dental problem, drooling, ear discharge, ear pain, facial swelling, hearing loss, mouth sores, nosebleeds, postnasal drip, rhinorrhea, sinus pressure, sinus pain, sneezing, sore throat, tinnitus, trouble swallowing and voice change.   Eyes: Negative for photophobia, pain, discharge, redness, itching and visual disturbance.  Respiratory: Negative for apnea, cough, choking, chest tightness,  shortness of breath, wheezing and stridor.   Cardiovascular: Negative for chest pain, palpitations and leg swelling.  Gastrointestinal: Negative for abdominal distention, abdominal pain, anal bleeding, blood in stool, constipation, diarrhea, nausea, rectal pain and vomiting.  Endocrine: Negative for cold intolerance, heat intolerance, polydipsia, polyphagia and polyuria.  Genitourinary: Negative for decreased urine volume, difficulty urinating, dyspareunia, dysuria, enuresis, flank pain, frequency, genital sores, hematuria, menstrual problem, pelvic pain, urgency, vaginal bleeding, vaginal discharge and vaginal pain.  Musculoskeletal: Negative for arthralgias, back pain, gait problem, joint swelling, myalgias, neck pain and neck stiffness.  Skin: Negative for color change, pallor, rash and wound.  Allergic/Immunologic: Negative for environmental allergies, food allergies and immunocompromised state.  Neurological: Negative for dizziness, tremors, seizures, syncope, facial asymmetry, speech difficulty, weakness, light-headedness, numbness and headaches.  Hematological: Negative for adenopathy. Does not bruise/bleed easily.  Psychiatric/Behavioral: Negative for agitation, behavioral problems,  confusion, decreased concentration, dysphoric mood, hallucinations, self-injury, sleep disturbance and suicidal ideas. The patient is not nervous/anxious and is not hyperactive.     Objective:  BP 138/72   Pulse 84   Temp 99 F (37.2 C) (Oral)   Resp 18   Ht 5' 4.5" (1.638 m)   Wt (!) 330 lb 3.2 oz (149.8 kg)   LMP 02/28/2018   SpO2 98%   BMI 55.80 kg/m   Physical Exam  Constitutional: She is oriented to person, place, and time. She appears well-developed and well-nourished. No distress.  Large body habitus noted.   HENT:  Head: Normocephalic and atraumatic.  Right Ear: Hearing, tympanic membrane, external ear and ear canal normal.  Left Ear: Hearing, tympanic membrane, external ear and ear canal  normal.  Nose: Nose normal.  Mouth/Throat: Uvula is midline, oropharynx is clear and moist and mucous membranes are normal. No oropharyngeal exudate.  Eyes: Pupils are equal, round, and reactive to light. Conjunctivae, EOM and lids are normal. No scleral icterus.  Neck: Trachea normal and normal range of motion. No thyroid mass and no thyromegaly present.  Cardiovascular: Normal rate, regular rhythm, normal heart sounds and intact distal pulses.  Pulmonary/Chest: Effort normal and breath sounds normal.  Abdominal: Soft. Normal appearance and bowel sounds are normal. There is no tenderness.  Lymphadenopathy:       Head (right side): No tonsillar, no preauricular, no posterior auricular and no occipital adenopathy present.       Head (left side): No tonsillar, no preauricular, no posterior auricular and no occipital adenopathy present.    She has no cervical adenopathy.       Right: No supraclavicular adenopathy present.       Left: No supraclavicular adenopathy present.  Neurological: She is alert and oriented to person, place, and time. She has normal strength and normal reflexes.  Skin: Skin is warm and dry.    Visual Acuity Screening   Right eye Left eye Both eyes  Without correction: _0  With correction:       BP Readings from Last 3 Encounters:  04/17/18 138/72  11/09/17 (!) 164/99  08/07/17 (!) 170/82    Assessment and Plan :  Discussed healthy lifestyle, diet, exercise, preventative care, vaccinations, and addressed patient's concerns. Plan for follow up in one year. Otherwise, plan for specific conditions below.  1. Annual physical exam Work form completed, copied, and return to patient.  Await lab results.  Recommend to follow-up with gynecologist.  2. Screening, anemia, deficiency, iron - CBC with Differential/Platelet  3. Screening for metabolic disorder - VDI71+EZBM  4. Screening cholesterol level - Lipid panel  5. Screening for hematuria or  proteinuria - Urinalysis, dipstick only  6. Essential hypertension BP 138/72 in office. patient does not check BP outside the office.  She is not taking amlodipine.  Prefers to control her blood pressure with lifestyle modifications.  Instructed to check bp outside of office over the next couple of weeks. Return if consistently >140/90. Given strict ED precautions.  - CBC with Differential/Platelet - CMP14+EGFR - Urinalysis, dipstick only - TSH  7. Screening for diabetes mellitus - Hemoglobin A1c  8. Class 3 severe obesity with serious comorbidity and body mass index (BMI) of 50.0 to 59.9 in adult, unspecified obesity type ([place]) Discussed lifestyle modifications with diet and exercise.  She would like a referral for weight management. - Amb Ref to Medical Weight Management   Tenna Delaine, PA-C  [facility] at [facility] 04/17/2018 12:10 PM

## 2018-04-17 ENCOUNTER — Other Ambulatory Visit: Payer: Self-pay

## 2018-04-17 ENCOUNTER — Encounter: Payer: Self-pay | Admitting: Physician Assistant

## 2018-04-17 ENCOUNTER — Ambulatory Visit (INDEPENDENT_AMBULATORY_CARE_PROVIDER_SITE_OTHER): Payer: BLUE CROSS/BLUE SHIELD | Admitting: Physician Assistant

## 2018-04-17 VITALS — BP 138/72 | HR 84 | Temp 99.0°F | Resp 18 | Ht 64.5 in | Wt 330.2 lb

## 2018-04-17 DIAGNOSIS — Z13220 Encounter for screening for lipoid disorders: Secondary | ICD-10-CM

## 2018-04-17 DIAGNOSIS — Z0000 Encounter for general adult medical examination without abnormal findings: Secondary | ICD-10-CM | POA: Diagnosis not present

## 2018-04-17 DIAGNOSIS — Z13228 Encounter for screening for other metabolic disorders: Secondary | ICD-10-CM | POA: Diagnosis not present

## 2018-04-17 DIAGNOSIS — I10 Essential (primary) hypertension: Secondary | ICD-10-CM

## 2018-04-17 DIAGNOSIS — Z6841 Body Mass Index (BMI) 40.0 and over, adult: Secondary | ICD-10-CM

## 2018-04-17 DIAGNOSIS — Z130 Encounter for screening for diseases of the blood and blood-forming organs and certain disorders involving the immune mechanism: Secondary | ICD-10-CM

## 2018-04-17 DIAGNOSIS — Z1389 Encounter for screening for other disorder: Secondary | ICD-10-CM

## 2018-04-17 DIAGNOSIS — Z131 Encounter for screening for diabetes mellitus: Secondary | ICD-10-CM

## 2018-04-17 NOTE — Patient Instructions (Addendum)
If you have lab work done today you will be contacted with your lab results within the next 2 weeks.  If you have not heard from [place] then please contact us. The fastest way to get your results is to register for My Chart.  Hypertension Hypertension, commonly called high blood pressure, is when the force of blood pumping through the arteries is too strong. The arteries are the blood vessels that carry blood from the heart throughout the body. Hypertension forces the heart to work harder to pump blood and may cause arteries to become narrow or stiff. Having untreated or uncontrolled hypertension can cause heart attacks, strokes, kidney disease, and other problems. A blood pressure reading consists of a higher number over a lower number. Ideally, your blood pressure should be below 120/80. The first ("top") number is called the systolic pressure. It is a measure of the pressure in your arteries as your heart beats. The second ("bottom") number is called the diastolic pressure. It is a measure of the pressure in your arteries as the heart relaxes. What are the causes? The cause of this condition is not known. What increases the risk? Some risk factors for high blood pressure are under your control. Others are not. Factors you can change  Smoking.  Having type 2 diabetes mellitus, high cholesterol, or both.  Not getting enough exercise or physical activity.  Being overweight.  Having too much fat, sugar, calories, or salt (sodium) in your diet.  Drinking too much alcohol. Factors that are difficult or impossible to change  Having chronic kidney disease.  Having a family history of high blood pressure.  Age. Risk increases with age.  Race. You may be at higher risk if you are African-American.  Gender. Men are at higher risk than women before age 80. After age 62, women are at higher risk than men.  Having obstructive sleep apnea.  Stress. What are the signs or symptoms? Extremely  high blood pressure (hypertensive crisis) may cause:  Headache.  Anxiety.  Shortness of breath.  Nosebleed.  Nausea and vomiting.  Severe chest pain.  Jerky movements you cannot control (seizures).  How is this diagnosed? This condition is diagnosed by measuring your blood pressure while you are seated, with your arm resting on a surface. The cuff of the blood pressure monitor will be placed directly against the skin of your upper arm at the level of your heart. It should be measured at least twice using the same arm. Certain conditions can cause a difference in blood pressure between your right and left arms. Certain factors can cause blood pressure readings to be lower or higher than normal (elevated) for a short period of time:  When your blood pressure is higher when you are in a health care provider's office than when you are at home, this is called white coat hypertension. Most people with this condition do not need medicines.  When your blood pressure is higher at home than when you are in a health care provider's office, this is called masked hypertension. Most people with this condition may need medicines to control blood pressure.  If you have a high blood pressure reading during one visit or you have normal blood pressure with other risk factors:  You may be asked to return on a different day to have your blood pressure checked again.  You may be asked to monitor your blood pressure at home for 1 week or longer.  If you are diagnosed with hypertension, you  may have other blood or imaging tests to help your health care provider understand your overall risk for other conditions. How is this treated? This condition is treated by making healthy lifestyle changes, such as eating healthy foods, exercising more, and reducing your alcohol intake. Your health care provider may prescribe medicine if lifestyle changes are not enough to get your blood pressure under control, and  if:  Your systolic blood pressure is above 130.  Your diastolic blood pressure is above 80.  Your personal target blood pressure may vary depending on your medical conditions, your age, and other factors. Follow these instructions at home: Eating and drinking  Eat a diet that is high in fiber and potassium, and low in sodium, added sugar, and fat. An example eating plan is called the DASH (Dietary Approaches to Stop Hypertension) diet. To eat this way: ? Eat plenty of fresh fruits and vegetables. Try to fill half of your plate at each meal with fruits and vegetables. ? Eat whole grains, such as whole wheat pasta, brown rice, or whole grain bread. Fill about one quarter of your plate with whole grains. ? Eat or drink low-fat dairy products, such as skim milk or low-fat yogurt. ? Avoid fatty cuts of meat, processed or cured meats, and poultry with skin. Fill about one quarter of your plate with lean proteins, such as fish, chicken without skin, beans, eggs, and tofu. ? Avoid premade and processed foods. These tend to be higher in sodium, added sugar, and fat.  Reduce your daily sodium intake. Most people with hypertension should eat less than 1,500 mg of sodium a day.  Limit alcohol intake to no more than 1 drink a day for nonpregnant women and 2 drinks a day for men. One drink equals 12 oz of beer, 5 oz of wine, or 1 oz of hard liquor. Lifestyle  Work with your health care provider to maintain a healthy body weight or to lose weight. Ask what an ideal weight is for you.  Get at least 30 minutes of exercise that causes your heart to beat faster (aerobic exercise) most days of the week. Activities may include walking, swimming, or biking.  Include exercise to strengthen your muscles (resistance exercise), such as pilates or lifting weights, as part of your weekly exercise routine. Try to do these types of exercises for 30 minutes at least 3 days a week.  Do not use any products that contain  nicotine or tobacco, such as cigarettes and e-cigarettes. If you need help quitting, ask your health care provider.  Monitor your blood pressure at home as told by your health care provider.  Keep all follow-up visits as told by your health care provider. This is important. Medicines  Take over-the-counter and prescription medicines only as told by your health care provider. Follow directions carefully. Blood pressure medicines must be taken as prescribed.  Do not skip doses of blood pressure medicine. Doing this puts you at risk for problems and can make the medicine less effective.  Ask your health care provider about side effects or reactions to medicines that you should watch for. Contact a health care provider if:  You think you are having a reaction to a medicine you are taking.  You have headaches that keep coming back (recurring).  You feel dizzy.  You have swelling in your ankles.  You have trouble with your vision. Get help right away if:  You develop a severe headache or confusion.  You have unusual weakness  or numbness.  You feel faint.  You have severe pain in your chest or abdomen.  You vomit repeatedly.  You have trouble breathing. Summary  Hypertension is when the force of blood pumping through your arteries is too strong. If this condition is not controlled, it may put you at risk for serious complications.  Your personal target blood pressure may vary depending on your medical conditions, your age, and other factors. For most people, a normal blood pressure is less than 120/80.  Hypertension is treated with lifestyle changes, medicines, or a combination of both. Lifestyle changes include weight loss, eating a healthy, low-sodium diet, exercising more, and limiting alcohol. This information is not intended to replace advice given to you by your health care provider. Make sure you discuss any questions you have with your health care provider. Document  Released: 07/16/2005 Document Revised: 06/13/2016 Document Reviewed: 06/13/2016 Elsevier Interactive Patient Education  2018 [place] Maintenance, Female Adopting a healthy lifestyle and getting preventive care can go a long way to promote health and wellness. Talk with your health care provider about what schedule of regular examinations is right for you. This is a good chance for you to check in with your provider about disease prevention and staying healthy. In between checkups, there are plenty of things you can do on your own. Experts have done a lot of research about which lifestyle changes and preventive measures are most likely to keep you healthy. Ask your health care provider for more information. Weight and diet Eat a healthy diet  Be sure to include plenty of vegetables, fruits, low-fat dairy products, and lean protein.  Do not eat a lot of foods high in solid fats, added sugars, or salt.  Get regular exercise. This is one of the most important things you can do for your health. ? Most adults should exercise for at least 150 minutes each week. The exercise should increase your heart rate and make you sweat (moderate-intensity exercise). ? Most adults should also do strengthening exercises at least twice a week. This is in addition to the moderate-intensity exercise.  Maintain a healthy weight  Body mass index (BMI) is a measurement that can be used to identify possible weight problems. It estimates body fat based on height and weight. Your health care provider can help determine your BMI and help you achieve or maintain a healthy weight.  For females 91 years of age and older: ? A BMI below 18.5 is considered underweight. ? A BMI of 18.5 to 24.9 is normal. ? A BMI of 25 to 29.9 is considered overweight. ? A BMI of 30 and above is considered obese.  Watch levels of cholesterol and blood lipids  You should start having your blood tested for lipids and cholesterol  at 36 years of age, then have this test every 5 years.  You may need to have your cholesterol levels checked more often if: ? Your lipid or cholesterol levels are high. ? You are older than 36 years of age. ? You are at high risk for heart disease.  Cancer screening Lung Cancer  Lung cancer screening is recommended for adults 46-19 years old who are at high risk for lung cancer because of a history of smoking.  A yearly low-dose CT scan of the lungs is recommended for people who: ? Currently smoke. ? Have quit within the past 15 years. ? Have at least a 30-pack-year history of smoking. A pack year is smoking an average  of one pack of cigarettes a day for 1 year.  Yearly screening should continue until it has been 15 years since you quit.  Yearly screening should stop if you develop a health problem that would prevent you from having lung cancer treatment.  Breast Cancer  Practice breast self-awareness. This means understanding how your breasts normally appear and feel.  It also means doing regular breast self-exams. Let your health care provider know about any changes, no matter how small.  If you are in your 20s or 30s, you should have a clinical breast exam (CBE) by a health care provider every 1-3 years as part of a regular health exam.  If you are 24 or older, have a CBE every year. Also consider having a breast X-ray (mammogram) every year.  If you have a family history of breast cancer, talk to your health care provider about genetic screening.  If you are at high risk for breast cancer, talk to your health care provider about having an MRI and a mammogram every year.  Breast cancer gene (BRCA) assessment is recommended for women who have family members with BRCA-related cancers. BRCA-related cancers include: ? Breast. ? Ovarian. ? Tubal. ? Peritoneal cancers.  Results of the assessment will determine the need for genetic counseling and BRCA1 and BRCA2  testing.  Cervical Cancer Your health care provider may recommend that you be screened regularly for cancer of the pelvic organs (ovaries, uterus, and vagina). This screening involves a pelvic examination, including checking for microscopic changes to the surface of your cervix (Pap test). You may be encouraged to have this screening done every 3 years, beginning at age 20.  For women ages 43-65, health care providers may recommend pelvic exams and Pap testing every 3 years, or they may recommend the Pap and pelvic exam, combined with testing for human papilloma virus (HPV), every 5 years. Some types of HPV increase your risk of cervical cancer. Testing for HPV may also be done on women of any age with unclear Pap test results.  Other health care providers may not recommend any screening for nonpregnant women who are considered low risk for pelvic cancer and who do not have symptoms. Ask your health care provider if a screening pelvic exam is right for you.  If you have had past treatment for cervical cancer or a condition that could lead to cancer, you need Pap tests and screening for cancer for at least 20 years after your treatment. If Pap tests have been discontinued, your risk factors (such as having a new sexual partner) need to be reassessed to determine if screening should resume. Some women have medical problems that increase the chance of getting cervical cancer. In these cases, your health care provider may recommend more frequent screening and Pap tests.  Colorectal Cancer  This type of cancer can be detected and often prevented.  Routine colorectal cancer screening usually begins at 36 years of age and continues through 36 years of age.  Your health care provider may recommend screening at an earlier age if you have risk factors for colon cancer.  Your health care provider may also recommend using home test kits to check for hidden blood in the stool.  A small camera at the end of a  tube can be used to examine your colon directly (sigmoidoscopy or colonoscopy). This is done to check for the earliest forms of colorectal cancer.  Routine screening usually begins at age 85.  Direct examination of the colon  should be repeated every 5-10 years through 36 years of age. However, you may need to be screened more often if early forms of precancerous polyps or small growths are found.  Skin Cancer  Check your skin from head to toe regularly.  Tell your health care provider about any new moles or changes in moles, especially if there is a change in a mole's shape or color.  Also tell your health care provider if you have a mole that is larger than the size of a pencil eraser.  Always use sunscreen. Apply sunscreen liberally and repeatedly throughout the day.  Protect yourself by wearing long sleeves, pants, a wide-brimmed hat, and sunglasses whenever you are outside.  Heart disease, diabetes, and high blood pressure  High blood pressure causes heart disease and increases the risk of stroke. High blood pressure is more likely to develop in: ? People who have blood pressure in the high end of the normal range (130-139/85-89 mm Hg). ? People who are overweight or obese. ? People who are African American.  If you are 50-83 years of age, have your blood pressure checked every 3-5 years. If you are 59 years of age or older, have your blood pressure checked every year. You should have your blood pressure measured twice-once when you are at a hospital or clinic, and once when you are not at a hospital or clinic. Record the average of the two measurements. To check your blood pressure when you are not at a hospital or clinic, you can use: ? An automated blood pressure machine at a pharmacy. ? A home blood pressure monitor.  If you are between 61 years and 6 years old, ask your health care provider if you should take aspirin to prevent strokes.  Have regular diabetes screenings. This  involves taking a blood sample to check your fasting blood sugar level. ? If you are at a normal weight and have a low risk for diabetes, have this test once every three years after 36 years of age. ? If you are overweight and have a high risk for diabetes, consider being tested at a younger age or more often. Preventing infection Hepatitis B  If you have a higher risk for hepatitis B, you should be screened for this virus. You are considered at high risk for hepatitis B if: ? You were born in a country where hepatitis B is common. Ask your health care provider which countries are considered high risk. ? Your parents were born in a high-risk country, and you have not been immunized against hepatitis B (hepatitis B vaccine). ? You have HIV or AIDS. ? You use needles to inject street drugs. ? You live with someone who has hepatitis B. ? You have had sex with someone who has hepatitis B. ? You get hemodialysis treatment. ? You take certain medicines for conditions, including cancer, organ transplantation, and autoimmune conditions.  Hepatitis C  Blood testing is recommended for: ? Everyone born from [birth year] through 1964. ? Anyone with known risk factors for hepatitis C.  Sexually transmitted infections (STIs)  You should be screened for sexually transmitted infections (STIs) including gonorrhea and chlamydia if: ? You are sexually active and are younger than 36 years of age. ? You are older than 36 years of age and your health care provider tells you that you are at risk for this type of infection. ? Your sexual activity has changed since you were last screened and you are at an increased risk  for chlamydia or gonorrhea. Ask your health care provider if you are at risk.  If you do not have HIV, but are at risk, it may be recommended that you take a prescription medicine daily to prevent HIV infection. This is called pre-exposure prophylaxis (PrEP). You are considered at risk if: ? You are  sexually active and do not regularly use condoms or know the HIV status of your partner(s). ? You take drugs by injection. ? You are sexually active with a partner who has HIV.  Talk with your health care provider about whether you are at high risk of being infected with HIV. If you choose to begin PrEP, you should first be tested for HIV. You should then be tested every 3 months for as long as you are taking PrEP. Pregnancy  If you are premenopausal and you may become pregnant, ask your health care provider about preconception counseling.  If you may become pregnant, take 400 to 800 micrograms (mcg) of folic acid every day.  If you want to prevent pregnancy, talk to your health care provider about birth control (contraception). Osteoporosis and menopause  Osteoporosis is a disease in which the bones lose minerals and strength with aging. This can result in serious bone fractures. Your risk for osteoporosis can be identified using a bone density scan.  If you are 39 years of age or older, or if you are at risk for osteoporosis and fractures, ask your health care provider if you should be screened.  Ask your health care provider whether you should take a calcium or vitamin D supplement to lower your risk for osteoporosis.  Menopause may have certain physical symptoms and risks.  Hormone replacement therapy may reduce some of these symptoms and risks. Talk to your health care provider about whether hormone replacement therapy is right for you. Follow these instructions at home:  Schedule regular health, dental, and eye exams.  Stay current with your immunizations.  Do not use any tobacco products including cigarettes, chewing tobacco, or electronic cigarettes.  If you are pregnant, do not drink alcohol.  If you are breastfeeding, limit how much and how often you drink alcohol.  Limit alcohol intake to no more than 1 drink per day for nonpregnant women. One drink equals 12 ounces of  beer, 5 ounces of wine, or 1 ounces of hard liquor.  Do not use street drugs.  Do not share needles.  Ask your health care provider for help if you need support or information about quitting drugs.  Tell your health care provider if you often feel depressed.  Tell your health care provider if you have ever been abused or do not feel safe at home. This information is not intended to replace advice given to you by your health care provider. Make sure you discuss any questions you have with your health care provider. Document Released: 01/29/2011 Document Revised: 12/22/2015 Document Reviewed: 04/19/2015 Elsevier Interactive Patient Education  2018 [employer].  IF you received an x-ray today, you will receive an invoice from [facility]. Please contact [facility] at [phone] with questions or concerns regarding your invoice.   IF you received labwork today, you will receive an invoice from [facility]. Please contact [facility] at [phone] with questions or concerns regarding your invoice.   Our billing staff will not be able to assist you with questions regarding bills from these companies.  You will be contacted with the lab results as soon as they are available. The fastest way to  get your results is to activate your My Chart account. Instructions are located on the last page of this paperwork. If you have not heard from [place] regarding the results in 2 weeks, please contact this office.

## 2018-04-17 NOTE — Progress Notes (Signed)

## 2018-04-18 ENCOUNTER — Telehealth: Payer: Self-pay | Admitting: Physician Assistant

## 2018-04-18 LAB — CBC WITH DIFFERENTIAL/PLATELET
BASOS: 1 %
Basophils Absolute: 0 10*3/uL (ref 0.0–0.2)
EOS (ABSOLUTE): 0.3 10*3/uL (ref 0.0–0.4)
EOS: 5 %
HEMATOCRIT: 34.9 % (ref 34.0–46.6)
Hemoglobin: 11 g/dL — ABNORMAL LOW (ref 11.1–15.9)
IMMATURE GRANS (ABS): 0 10*3/uL (ref 0.0–0.1)
IMMATURE GRANULOCYTES: 0 %
LYMPHS: 24 %
Lymphocytes Absolute: 1.5 10*3/uL (ref 0.7–3.1)
MCH: 24.9 pg — ABNORMAL LOW (ref 26.6–33.0)
MCHC: 31.5 g/dL (ref 31.5–35.7)
MCV: 79 fL (ref 79–97)
MONOCYTES: 9 %
Monocytes Absolute: 0.6 10*3/uL (ref 0.1–0.9)
NEUTROS PCT: 61 %
Neutrophils Absolute: 3.8 10*3/uL (ref 1.4–7.0)
PLATELETS: 345 10*3/uL (ref 150–450)
RBC: 4.42 x10E6/uL (ref 3.77–5.28)
RDW: 13.8 % (ref 12.3–15.4)
WBC: 6.3 10*3/uL (ref 3.4–10.8)

## 2018-04-18 LAB — URINALYSIS, DIPSTICK ONLY
Bilirubin, UA: NEGATIVE
Glucose, UA: NEGATIVE
KETONES UA: NEGATIVE
LEUKOCYTES UA: NEGATIVE
Nitrite, UA: NEGATIVE
PROTEIN UA: NEGATIVE
RBC UA: NEGATIVE
SPEC GRAV UA: 1.019 (ref 1.005–1.030)
Urobilinogen, Ur: 0.2 mg/dL (ref 0.2–1.0)
pH, UA: 6 (ref 5.0–7.5)

## 2018-04-18 LAB — CMP14+EGFR
A/G RATIO: 1.7 (ref 1.2–2.2)
ALT: 12 IU/L (ref 0–32)
AST: 12 IU/L (ref 0–40)
Albumin: 4.5 g/dL (ref 3.5–5.5)
Alkaline Phosphatase: 54 IU/L (ref 39–117)
BUN/Creatinine Ratio: 18 (ref 9–23)
BUN: 10 mg/dL (ref 6–20)
Bilirubin Total: 0.2 mg/dL (ref 0.0–1.2)
CALCIUM: 9.5 mg/dL (ref 8.7–10.2)
CO2: 25 mmol/L (ref 20–29)
Chloride: 103 mmol/L (ref 96–106)
Creatinine, Ser: 0.57 mg/dL (ref 0.57–1.00)
GFR, EST AFRICAN AMERICAN: 139 mL/min/{1.73_m2} (ref >59)
GFR, EST NON AFRICAN AMERICAN: 120 mL/min/{1.73_m2} (ref >59)
Globulin, Total: 2.7 g/dL (ref 1.5–4.5)
Glucose: 80 mg/dL (ref 65–99)
POTASSIUM: 4.8 mmol/L (ref 3.5–5.2)
Sodium: 143 mmol/L (ref 134–144)
TOTAL PROTEIN: 7.2 g/dL (ref 6.0–8.5)

## 2018-04-18 LAB — LIPID PANEL
CHOL/HDL RATIO: 4.2 ratio (ref 0.0–4.4)
Cholesterol, Total: 173 mg/dL (ref 100–199)
HDL: 41 mg/dL (ref >39)
LDL CALC: 100 mg/dL — AB (ref 0–99)
Triglycerides: 158 mg/dL — ABNORMAL HIGH (ref 0–149)
VLDL Cholesterol Cal: 32 mg/dL (ref 5–40)

## 2018-04-18 LAB — TSH: TSH: 2.3 u[IU]/mL (ref 0.450–4.500)

## 2018-04-18 LAB — HEMOGLOBIN A1C
Est. average glucose Bld gHb Est-mCnc: 117 mg/dL
Hgb A1c MFr Bld: 5.7 % — ABNORMAL HIGH (ref 4.8–5.6)

## 2018-04-18 NOTE — Telephone Encounter (Signed)
Copied from [place] [id number]. Topic: General - Other >> Apr 18, 2018  2:06 PM Lennox Solders wrote: Reason for CRM: Pt saw Molly Archer yesterday and she is still waiting for triamcinolone cream to be sent to [facility]. This medication will be a new rx from [facility]

## 2018-04-21 ENCOUNTER — Telehealth: Payer: Self-pay | Admitting: Family Medicine

## 2018-04-21 NOTE — Telephone Encounter (Signed)
Called patient & explained she does not have endometrial cancer, that was added in error. Apologized to patient. Patient verbalized understanding & states her irregular bleeding has started back again so she has an appt scheduled for 10/10. Told patient to have Dr Elly Modena remove that from her chart when she comes in for her appt and to call us prior to her appt if bleeding gets too heavy and she becomes symptomatic. Patient verbalized understanding & had no questions.

## 2018-04-21 NOTE — Telephone Encounter (Signed)
Patients states that she was recently formed by another doctor that she has endometrial cancer and states that she was never made aware of this. Would like to speak with someone is regards to this concern. Please give patient a call back.

## 2018-04-30 ENCOUNTER — Encounter: Payer: Self-pay | Admitting: Internal Medicine

## 2018-04-30 ENCOUNTER — Ambulatory Visit (INDEPENDENT_AMBULATORY_CARE_PROVIDER_SITE_OTHER): Payer: BLUE CROSS/BLUE SHIELD | Admitting: Internal Medicine

## 2018-04-30 ENCOUNTER — Telehealth: Payer: Self-pay

## 2018-04-30 VITALS — BP 163/96 | HR 91 | Ht 65.0 in | Wt 326.0 lb

## 2018-04-30 DIAGNOSIS — N938 Other specified abnormal uterine and vaginal bleeding: Secondary | ICD-10-CM | POA: Diagnosis not present

## 2018-04-30 DIAGNOSIS — Z3042 Encounter for surveillance of injectable contraceptive: Secondary | ICD-10-CM | POA: Diagnosis not present

## 2018-04-30 MED ORDER — MEDROXYPROGESTERONE ACETATE 150 MG/ML IM SUSP
150.0000 mg | Freq: Once | INTRAMUSCULAR | Status: AC
  Administered 2018-04-30: 150 mg via INTRAMUSCULAR

## 2018-04-30 NOTE — Progress Notes (Signed)
iud fell out in 2016 had periods on July 23-Aug 2nd  And June 16-June 24th

## 2018-04-30 NOTE — Patient Instructions (Addendum)
Please return for your appointment with Dr. Elly Modena to discuss further management. We will call you in the morning with an appointment for your ultrasound. If you become lightheaded, feel like you're going to pass out, have chest pain, racing heart rate, or shortness of breath you need to go to the ER.

## 2018-04-30 NOTE — Telephone Encounter (Signed)
Pt called in because she's been having heavy bleeding for the past 3 weeks states "its falling like water" advised pt to come to [facility] or to walk in to our clinic between 430-730 pm tonight. Pt verbalized understanding and stated she would come soon as she got off work.

## 2018-04-30 NOTE — Progress Notes (Signed)
   Subjective:    Molly Archer - 37 y.o. female MRN [id number]  Date of birth: 1980-10-11  HPI  Molly Archer is a 37 y.o. G0P0000 female here for heavy vaginal bleeding.    Started Sept 19. Has been occurring for the past 3 months has been having heavy, prolonged bleeding lasting 2-3 weeks. Had IUD that came out in 2016 shortly after placement. Had not had any bleeding between 2016 and June 2019.   Has a history of DUB. Has had four prior D&Cs for DUB. Reports Megace made her bleeding worse in the past. Has used Depo previously with better control of her bleeding.   OB History    Gravida  0   Para  0   Term  0   Preterm  0   AB  0   Living  0     SAB  0   TAB  0   Ectopic  0   Multiple  0   Live Births              Health Maintenance Due  Topic Date Due  . HIV Screening  06/06/1997  . TETANUS/TDAP  06/06/2001  . PAP SMEAR  10/21/2016    -  reports that she has never smoked. She has never used smokeless tobacco. - Review of Systems: Per HPI. - Past Medical History: Patient Active Problem List   Diagnosis Date Noted  . DUB (dysfunctional uterine bleeding)   . Menometrorrhagia   . Amenorrhea 11/25/2013  . Obesity 09/08/2012  . HPV (human papilloma virus) infection 02/28/2012  . LSIL (low grade squamous intraepithelial lesion) on Pap smear 02/28/2012  . Hypertension 02/25/2012  . Hx of Bell's palsy 02/25/2012   - Medications: reviewed and updated   Objective:   Physical Exam BP (!) 163/96   Pulse 91   Ht 5\' 5"  (1.651 m)   Wt (!) 326 lb (147.9 kg)   LMP 04/17/2018   BMI 54.25 kg/m  Gen: NAD, alert, cooperative with exam, well-appearing GU/GYN: Exam performed in the presence of a chaperone. External genitalia within normal limits.  Vaginal mucosa pink, moist, normal rugae. No vaginal wall lesions. Nonfriable cervix without lesions, no discharge noted on speculum exam. Large blood clots present in vaginal vault and on perineum. Bimanual exam  revealed normal, nongravid uterus.  No cervical motion tenderness. No adnexal masses bilaterally.        Assessment & Plan:   1. DUB (dysfunctional uterine bleeding) Patient with long standing history of DUB. Last vaginal sono in 2016 without fibroids or masses. Endometrial biopsy in 2016 without malignancy. Will obtain another pelvic sono. Patient has f/u scheduled with Dr. Elly Modena on 10/10 to discuss further management/work up. Depo given per patient preference in an attempt to control DUB. CBC ordered due to prolonged, heavy bleeding; however patient left prior to draw. Would recommend obtaining at next visit.  - CBC - US Transvaginal Non-OB; Future - medroxyPROGESTERone (DEPO-PROVERA) injection 150 mg    Routine preventative health maintenance measures emphasized. Please refer to After Visit Summary for other counseling recommendations.   Phill Myron, D.O. OB Fellow  04/30/2018, 6:30 PM

## 2018-05-06 ENCOUNTER — Encounter: Payer: Self-pay | Admitting: Physician Assistant

## 2018-05-06 ENCOUNTER — Ambulatory Visit (HOSPITAL_COMMUNITY)
Admission: RE | Admit: 2018-05-06 | Discharge: 2018-05-06 | Disposition: A | Discharge status: Home / Self Care | Payer: BLUE CROSS/BLUE SHIELD | Source: Ambulatory Visit | Attending: Internal Medicine | Admitting: Internal Medicine

## 2018-05-06 DIAGNOSIS — N938 Other specified abnormal uterine and vaginal bleeding: Secondary | ICD-10-CM | POA: Insufficient documentation

## 2018-05-06 NOTE — Telephone Encounter (Signed)
Called pt. No answer.Left voicemail to check mychart message. Sent message via mychart.

## 2018-05-07 ENCOUNTER — Telehealth: Payer: Self-pay | Admitting: Physician Assistant

## 2018-05-07 ENCOUNTER — Other Ambulatory Visit: Payer: Self-pay | Admitting: Physician Assistant

## 2018-05-07 MED ORDER — TRIAMCINOLONE ACETONIDE 0.5 % EX CREA: 1.0000 "application " | TOPICAL_CREAM | Freq: Three times a day (TID) | CUTANEOUS | 0 refills | Status: DC

## 2018-05-07 MED ORDER — TRIAMCINOLONE ACETONIDE 0.1 % EX CREA: 1.0000 "application " | TOPICAL_CREAM | Freq: Two times a day (BID) | CUTANEOUS | 0 refills | Status: DC

## 2018-05-07 NOTE — Telephone Encounter (Unsigned)
Copied from [place]. Topic: General - Other >> May 07, 2018 10:25 AM Marin Olp L wrote: Reason for CRM: Patient calling to let Timmothy Euler know the cream she is prescribing needs to go on the back of her neck. See mychart message from yesterday.

## 2018-05-07 NOTE — Progress Notes (Signed)
Meds ordered this encounter  Medications  . DISCONTD: triamcinolone cream (KENALOG) 0.5 %    Sig: Apply 1 application topically 3 (three) times daily.    Dispense:  30 g    Refill:  0    Order Specific Question:   Supervising Provider    Answer:   Horald Pollen [id number]  . triamcinolone cream (KENALOG) 0.1 %    Sig: Apply 1 application topically 2 (two) times daily.    Dispense:  30 g    Refill:  0    Order Specific Question:   Supervising Provider    Answer:   Horald Pollen [id number]

## 2018-05-07 NOTE — Telephone Encounter (Signed)
Can you please call pt's pharmacy and cancel the triamcinolone 0.5% and keep the triamcinolone 0.1%? Thanks!

## 2018-05-08 ENCOUNTER — Ambulatory Visit (INDEPENDENT_AMBULATORY_CARE_PROVIDER_SITE_OTHER): Payer: BLUE CROSS/BLUE SHIELD | Admitting: Obstetrics and Gynecology

## 2018-05-08 ENCOUNTER — Encounter: Payer: Self-pay | Admitting: Obstetrics and Gynecology

## 2018-05-08 VITALS — BP 143/73 | HR 82 | Ht 65.0 in | Wt 326.9 lb

## 2018-05-08 DIAGNOSIS — N921 Excessive and frequent menstruation with irregular cycle: Secondary | ICD-10-CM | POA: Diagnosis not present

## 2018-05-08 MED ORDER — TRANEXAMIC ACID 650 MG PO TABS: 1300.0000 mg | ORAL_TABLET | Freq: Three times a day (TID) | ORAL | 2 refills | Status: DC

## 2018-05-08 NOTE — Patient Instructions (Signed)
Tranexamic acid oral tablets What is this medicine? TRANEXAMIC ACID (TRAN ex AM ik AS id) slows down or stops blood clots from being broken down. This medicine is used to treat heavy monthly menstrual bleeding. This medicine may be used for other purposes; ask your health care provider or pharmacist if you have questions. COMMON BRAND NAME(S): Cyklokapron, Lysteda What should I tell my health care provider before I take this medicine? They need to know if you have any of these conditions: -bleeding in the brain -blood clotting problems -kidney disease -vision problems -an unusual allergic reaction to tranexamic acid, other medicines, foods, dyes, or preservatives -pregnant or trying to get pregnant -breast-feeding How should I use this medicine? Take this medicine by mouth with a glass of water. Follow the directions on the prescription label. Do not cut, crush, or chew this medicine. You can take it with or without food. If it upsets your stomach, take it with food. Take your medicine at regular intervals. Do not take it more often than directed. Do not stop taking except on your doctor's advice. Do not take this medicine until your period has started. Do not take it for more than 5 days in a row. Do not take this medicine when you do not have your period. Talk to your pediatrician regarding the use of this medicine in children. While this drug may be prescribed for female children as young as 1 years of age for selected conditions, precautions do apply. Overdosage: If you think you have taken too much of this medicine contact a poison control center or emergency room at once. NOTE: This medicine is only for you. Do not share this medicine with others. What if I miss a dose? If you miss a dose, take it when you remember, and then take your next dose at least 6 hours later. Do not take more than 2 tablets at a time to make up for missed doses. What may interact with this medicine? Do not take  this medicine with any of the following medications: -estrogens -birth control pills, patches, injections, rings or other devices that contain both an estrogen and a progestin This medicine may also interact with the following medications: -certain medicines used to help your blood clot -tretinoin (taken by mouth) This list may not describe all possible interactions. Give your health care provider a list of all the medicines, herbs, non-prescription drugs, or dietary supplements you use. Also tell them if you smoke, drink alcohol, or use illegal drugs. Some items may interact with your medicine. What should I watch for while using this medicine? Tell your doctor or healthcare professional if your symptoms do not start to get better or if they get worse. Tell your doctor or healthcare professional if you notice any eye problems while taking this medicine. Your doctor will refer you to an eye doctor who will examine your eyes. What side effects may I notice from receiving this medicine? Side effects that you should report to your doctor or health care professional as soon as possible: -allergic reactions like skin rash, itching or hives, swelling of the face, lips, or tongue -breathing difficulties -changes in vision -sudden or severe pain in the chest, legs, head, or groin -unusually weak or tired Side effects that usually do not require medical attention (report to your doctor or health care professional if they continue or are bothersome): -back pain -headache -muscle or joint aches -sinus and nasal problems -stomach pain -tiredness This list may not describe all  possible side effects. Call your doctor for medical advice about side effects. You may report side effects to FDA at 1-800-FDA-1088. Where should I keep my medicine? Keep out of the reach of children. Store at room temperature between 15 and 30 degrees C (59 and 86 degrees F). Throw away any unused medicine after the expiration  date. NOTE: This sheet is a summary. It may not cover all possible information. If you have questions about this medicine, talk to your doctor, pharmacist, or health care provider.  2018 Elsevier/Gold Standard (2015-08-18 09:12:15)

## 2018-05-08 NOTE — Progress Notes (Signed)
37 yo with DUB presenting today to discuss ultrasound results. Patient with long standing history of DUB previously managed with IUD and megace. Patient received depo-provera on 10/2 and reports no further episode of vaginal bleeding over the past 3 days. She still reports feeling tired. Patient is otherwise without complaints  Past Medical History:  Diagnosis Date  . Anemia   . Asthma    only uses inhaler in fall and spring  . GERD (gastroesophageal reflux disease)    diet controlled, occ. uses omeprazole  . Headache    otc med prn  . History of kidney stones   . Hx of Bell's palsy June 2013  . Hypertension    currently no meds  . Menometrorrhagia   . Psoriasis   . Shortness of breath dyspnea    with exercise/exertion   Past Surgical History:  Procedure Laterality Date  . COMBINED HYSTEROSCOPY DIAGNOSTIC / D&C N/A 2011   Benign secretory endometrium  . CYSTOSCOPY WITH STENT PLACEMENT Left 10/19/2014   Procedure: CYSTOSCOPY WITH STENT PLACEMENT;  Surgeon: Ardis Hughs, MD;  Location: [facility];  Service: Urology;  Laterality: Left;  . DILATION AND CURETTAGE OF UTERUS  10/2003   Polyp, mild atypia, simple and complex hyperplasia,  . DILATION AND CURETTAGE OF UTERUS  11/2003   Simple and complex hyperplasia  . DILATION AND CURETTAGE OF UTERUS  2011  . DILATION AND CURETTAGE OF UTERUS N/A 12/14/2014   Procedure: DILATATION AND CURETTAGE;  Surgeon: Mora Bellman, MD;  Location: [facility];  Service: Gynecology;  Laterality: N/A;  . HOLMIUM LASER APPLICATION Left 2/69/4854   Procedure: HOLMIUM LASER APPLICATION;  Surgeon: Ardis Hughs, MD;  Location: [facility];  Service: Urology;  Laterality: Left;  . INTRAUTERINE DEVICE (IUD) INSERTION N/A 12/14/2014   Procedure: INTRAUTERINE DEVICE (IUD) INSERTION;  Surgeon: Mora Bellman, MD;  Location: [facility];  Service: Gynecology;  Laterality: N/A;  . STONE EXTRACTION WITH BASKET Left 10/19/2014   Procedure: STONE EXTRACTION WITH BASKET;  Surgeon:  Ardis Hughs, MD;  Location: [facility];  Service: Urology;  Laterality: Left;  . WISDOM TOOTH EXTRACTION     Family History  Problem Relation Age of Onset  . Hypertension Other   . Diabetes Other   . Cancer Other    Social History   Socioeconomic History  . Marital status: Single    Spouse name: Not on file  . Number of children: Not on file  . Years of education: Not on file  . Highest education level: Not on file  Occupational History  . Not on file  Social Needs  . Financial resource strain: Not very hard  . Food insecurity:    Worry: Never true    Inability: Never true  . Transportation needs:    Medical: No    Non-medical: No  Tobacco Use  . Smoking status: Never Smoker  . Smokeless tobacco: Never Used  Substance and Sexual Activity  . Alcohol use: No  . Drug use: No  . Sexual activity: Not Currently    Birth control/protection: None  Lifestyle  . Physical activity:    Days per week: 0 days    Minutes per session: 0 min  . Stress: Not at all  Relationships  . Social connections:    Talks on phone: More than three times a week    Gets together: Once a week    Attends religious service: More than 4 times per year    Active member of club or  organization: Yes    Attends meetings of clubs or organizations: More than 4 times per year    Relationship status: Never married  . Intimate partner violence:    Fear of current or ex partner: Not on file    Emotionally abused: Not on file    Physically abused: Not on file    Forced sexual activity: Not on file  Other Topics Concern  . Not on file  Social History Narrative   She is from [place], [place], moved here 1998.    Lives at home by herself.    ROS See pertinent in HPI  Blood pressure (!) 143/73, pulse 82, height 5\' 5"  (1.651 m), weight (!) 326 lb 14.4 oz (148.3 kg), last menstrual period 05/05/2018.  GENERAL: Well-developed, well-nourished female in no acute distress.   ABDOMEN: Soft, nontender,  nondistended. No organomegaly. PELVIC: Normal external female genitalia. Vagina is pink and rugated.  Normal discharge. Normal appearing cervix. Uterus is normal in size.  No adnexal mass or tenderness. EXTREMITIES: No cyanosis, clubbing, or edema, 2+ distal pulses.  US Transvaginal Non-ob  Result Date: 05/06/2018 CLINICAL DATA:  Dysfunctional uterine bleeding. EXAM: ULTRASOUND PELVIS TRANSVAGINAL TECHNIQUE: Transvaginal ultrasound examination of the pelvis was performed including evaluation of the uterus, ovaries, adnexal regions, and pelvic cul-de-sac. COMPARISON:  01/12/2015 FINDINGS: Uterus Measurements: 9.0 x 6.1 x 6.4 cm. No fibroids or other mass visualized. Endometrium Thickness: 16 mm in thickness. There appear to be 2 hyperechoic solid areas within the endometrium, on the left measuring 2.1 x 1.8 x 1.4 cm and on the right measuring 2.1 x 1.8 x 1.3 cm. Right ovary Measurements: 3.4 x 2.0 x 2.6 cm. Normal appearance/no adnexal mass. Left ovary Measurements: 2.7 x 1.8 x 1.6 cm. Normal appearance/no adnexal mass. Other findings:  Small amount of free fluid in the pelvis. IMPRESSION: Endometrium upper limits normal in thickness at 16 mm. There appear to be 2 hyperechoic solid nodules within the endometrium. Consider further evaluation with sonohysterogram for confirmation prior to hysteroscopy. Endometrial sampling should also be considered if patient is at high risk for endometrial carcinoma. (Ref: Radiological Reasoning: Algorithmic Workup of Abnormal Vaginal Bleeding with Endovaginal Sonography and Sonohysterography. AJR 2008; 342:A76-81) Electronically Signed   By: Rolm Baptise M.D.   On: 05/06/2018 10:22     A/P 37 yo with DUB - continue depo-provera - will check cbc - rx lysteda provided in the event of heavy bleeding - discussed weight loss management  - RTC for annual exam

## 2018-05-09 LAB — CBC
HEMATOCRIT: 26.7 % — AB (ref 34.0–46.6)
Hemoglobin: 8.3 g/dL — ABNORMAL LOW (ref 11.1–15.9)
MCH: 23.9 pg — ABNORMAL LOW (ref 26.6–33.0)
MCHC: 31.1 g/dL — AB (ref 31.5–35.7)
MCV: 77 fL — ABNORMAL LOW (ref 79–97)
PLATELETS: 439 10*3/uL (ref 150–450)
RBC: 3.48 x10E6/uL — ABNORMAL LOW (ref 3.77–5.28)
RDW: 13.9 % (ref 12.3–15.4)
WBC: 6.7 10*3/uL (ref 3.4–10.8)

## 2018-06-05 ENCOUNTER — Telehealth: Payer: Self-pay | Admitting: *Deleted

## 2018-06-05 NOTE — Telephone Encounter (Signed)
I called [name] back and left a message I am returning her call and since she is on MyChart will send a message via MyChart. She can call us back if needed or send a message.

## 2018-06-05 NOTE — Telephone Encounter (Signed)
I called Saul and advised her I have heard back from [place] and  She wants her to come in to be seen and front office will call you with an appointment. Call us if questions.

## 2018-06-05 NOTE — Telephone Encounter (Signed)
Molly Archer called and left a voice message she is having bleeding and cramping really bad. States she had not had cramping in years and it is severe.  STates she wanted to see if she can get relief. States she is taking motrin and aleve without relief. States she has been having abnormal bleeding for months now and at her last visit started depo which is when cramps started. States they also gave her a second medicine to take when bleeding for 5 days and is on day 3 and still bleeding. Wants a call back.

## 2018-06-05 NOTE — Telephone Encounter (Signed)
Francess called back to front desk and call transferred to nurse. We discussed her bleeding. She states she has been having issues for years and has tried everything and been told she is running out of options. She states currently is on day 3 of lysteda and is changing her pad every hour all day and it is full and it is an overnight pad. She denies dizziness /lightheadedness. States her cramps are painful -about a 9 before and after ibuprofen.States she is taking ibuprofen 400mg  every 4 hours. She states she has tried megace before and doesn't want that. I advised her that she is bleeding too much and I advise her to come to [facility] for evaluation. She states she doesn't want to do that. I advised her Dr.Constant wanted her to schedule her yearly exam and I encouraged her to do that. I advised her if she starts feeling dizzy or lightheaded it may mean her blood count is too low and she should come to [facility] for evaluation. I also advised her to take ibuprofen 600mg  every 6 hours and to drink a lot of water as this can affect her kidneys.I also informed her I will send a message to Dr.Constant to see if there are any other orders. She voices understanding.

## 2018-06-09 ENCOUNTER — Ambulatory Visit (INDEPENDENT_AMBULATORY_CARE_PROVIDER_SITE_OTHER): Payer: BLUE CROSS/BLUE SHIELD | Admitting: Family Medicine

## 2018-06-09 ENCOUNTER — Encounter: Payer: Self-pay | Admitting: Family Medicine

## 2018-06-09 VITALS — BP 162/87 | HR 80 | Ht 65.0 in | Wt 323.8 lb

## 2018-06-09 DIAGNOSIS — Z3042 Encounter for surveillance of injectable contraceptive: Secondary | ICD-10-CM | POA: Diagnosis not present

## 2018-06-09 DIAGNOSIS — N938 Other specified abnormal uterine and vaginal bleeding: Secondary | ICD-10-CM | POA: Diagnosis not present

## 2018-06-09 DIAGNOSIS — Z124 Encounter for screening for malignant neoplasm of cervix: Secondary | ICD-10-CM | POA: Diagnosis not present

## 2018-06-09 DIAGNOSIS — Z1151 Encounter for screening for human papillomavirus (HPV): Secondary | ICD-10-CM | POA: Diagnosis not present

## 2018-06-09 DIAGNOSIS — Z0000 Encounter for general adult medical examination without abnormal findings: Secondary | ICD-10-CM

## 2018-06-09 MED ORDER — FERROUS SULFATE 325 (65 FE) MG PO TABS: 325.0000 mg | ORAL_TABLET | Freq: Two times a day (BID) | ORAL | 3 refills | Status: DC

## 2018-06-09 MED ORDER — MEDROXYPROGESTERONE ACETATE 150 MG/ML IM SUSP
150.0000 mg | Freq: Once | INTRAMUSCULAR | Status: AC
  Administered 2018-06-09: 150 mg via INTRAMUSCULAR

## 2018-06-09 NOTE — Progress Notes (Addendum)
   GYNECOLOGY OFFICE VISIT NOTE History:  37 y.o. G0P0000 here today for abnormal uterine bleeding.   - history of heavy periods, sometimes goes a year without one - has had lots of D&C - tried Depo monthly, two IUDs and expulsion with blood clots - 2016-this year - okay until June then off/on every 2 weeks - same since October - told to try losing weight, too much estrogen - Megace has made worse in the past, didn't want Depo but got it  - now lots of cramping since last week - someone had to drive her home, crying because of pain   - has taken 5 days Lysteda  - taking ibuprofen every 6 hours  - clots coming down floor   The following portions of the patient's history were reviewed and updated as appropriate: allergies, current medications, past family history, past medical history, past social history, past surgical history and problem list.   Review of Systems:  Pertinent items noted in HPI ROS  Objective:  Physical Exam BP (!) 162/87   Pulse 80   Ht 5\' 5"  (1.651 m)   Wt (!) 323 lb 12.8 oz (146.9 kg)   BMI 53.88 kg/m  Physical Exam  Constitutional: She is oriented to person, place, and time. She appears well-developed and well-nourished. No distress.  HENT:  Head: Normocephalic and atraumatic.  Eyes: Conjunctivae and EOM are normal.  Cardiovascular: Normal rate, regular rhythm, normal heart sounds and intact distal pulses.  Pulmonary/Chest: No respiratory distress.  Abdominal:  Obese  no guarding, TTP  Genitourinary:  Genitourinary Comments: Vagina normal appearing  small amount of bright red blood coming from os  normal-appearing cervix   Neurological: She is alert and oriented to person, place, and time.  Psychiatric: She has a normal mood and affect. Her behavior is normal.  Nursing note and vitals reviewed.   Assessment & Plan:  1. Healthcare Maintenance - Cytology - PAP  2. Abnormal Uterine Bleeding: Work-up has included TVUS - completed 10/19 with  endometrium upper limit of normal in thickness (42mm) with two hyperechoic nodules within endometrium. Has history of EMB in 2016 (polyp) but none since that time. Has had multiple medication trials and also several D&C. Iron deficiency anemia present, fatigue but stable from last check. Does not desire hysterectomy as would like to maintain fertility. -- repeat Depo today -- ferrous sulfate BID -- ibuprofen prn pain  -- schedule next week for endometrial biopsy and surgical consultation   3. Elevated Blood Pressure: Encouraged to follow-up with PCP.   Routine preventative health maintenance measures emphasized. Please refer to After Visit Summary for other counseling recommendations.   Return in about 1 week (around 06/16/2018) for endometrial biopsy .  Total face-to-face time with patient: 30 minutes.  Over 50% of encounter was spent on counseling and coordination of care.  Lambert Mody. Juleen China, DO OB Family Medicine Fellow, [facility] for [employer], [place]

## 2018-06-09 NOTE — Progress Notes (Signed)
Pt states has been bleeding off & on since June but in October bleed straight thru, heavy w/ clots. Tuesday started having cramping.

## 2018-06-10 LAB — CBC WITH DIFFERENTIAL/PLATELET
BASOS ABS: 0 10*3/uL (ref 0.0–0.2)
Basos: 1 %
EOS (ABSOLUTE): 0.3 10*3/uL (ref 0.0–0.4)
Eos: 4 %
Hematocrit: 29.9 % — ABNORMAL LOW (ref 34.0–46.6)
Hemoglobin: 8.8 g/dL — ABNORMAL LOW (ref 11.1–15.9)
Immature Grans (Abs): 0 10*3/uL (ref 0.0–0.1)
Immature Granulocytes: 0 %
LYMPHS ABS: 1.8 10*3/uL (ref 0.7–3.1)
Lymphs: 23 %
MCH: 21.2 pg — AB (ref 26.6–33.0)
MCHC: 29.4 g/dL — AB (ref 31.5–35.7)
MCV: 72 fL — ABNORMAL LOW (ref 79–97)
MONOS ABS: 0.6 10*3/uL (ref 0.1–0.9)
Monocytes: 8 %
Neutrophils Absolute: 4.8 10*3/uL (ref 1.4–7.0)
Neutrophils: 64 %
Platelets: 451 10*3/uL — ABNORMAL HIGH (ref 150–450)
RBC: 4.15 x10E6/uL (ref 3.77–5.28)
RDW: 14.5 % (ref 12.3–15.4)
WBC: 7.7 10*3/uL (ref 3.4–10.8)

## 2018-06-12 LAB — CYTOLOGY - PAP
Diagnosis: NEGATIVE
HPV: NOT DETECTED

## 2018-06-17 ENCOUNTER — Ambulatory Visit (INDEPENDENT_AMBULATORY_CARE_PROVIDER_SITE_OTHER): Payer: BLUE CROSS/BLUE SHIELD | Admitting: Obstetrics & Gynecology

## 2018-06-17 ENCOUNTER — Encounter: Payer: Self-pay | Admitting: Obstetrics & Gynecology

## 2018-06-17 VITALS — BP 191/92 | HR 88 | Wt 327.0 lb

## 2018-06-17 DIAGNOSIS — N938 Other specified abnormal uterine and vaginal bleeding: Secondary | ICD-10-CM

## 2018-06-17 LAB — POCT PREGNANCY, URINE: PREG TEST UR: NEGATIVE

## 2018-06-17 NOTE — Progress Notes (Signed)
Patient ID: Molly Archer, female   DOB: 05/26/1981, 37 y.o.   MRN: [id number]  Chief Complaint  Patient presents with  . Endometrial Biopsy  Light vaginal bleeding  HPI Molly Archer is a 37 y.o. female.  G0P0000 No LMP recorded. (Menstrual status: Irregular Periods). She has had 2 DMPA injections and an [place] 05/08/18. Patient notes light vaginal bleeding now no pain. Last D&C was 2016 with benign tissue. HPI  Past Medical History:  Diagnosis Date  . Anemia   . Asthma    only uses inhaler in fall and spring  . GERD (gastroesophageal reflux disease)    diet controlled, occ. uses omeprazole  . Headache    otc med prn  . History of kidney stones   . Hx of Bell's palsy June 2013  . Hypertension    currently no meds  . Menometrorrhagia   . Psoriasis   . Shortness of breath dyspnea    with exercise/exertion    Past Surgical History:  Procedure Laterality Date  . COMBINED HYSTEROSCOPY DIAGNOSTIC / D&C N/A 2011   Benign secretory endometrium  . CYSTOSCOPY WITH STENT PLACEMENT Left 10/19/2014   Procedure: CYSTOSCOPY WITH STENT PLACEMENT;  Surgeon: Ardis Hughs, MD;  Location: [facility];  Service: Urology;  Laterality: Left;  . DILATION AND CURETTAGE OF UTERUS  10/2003   Polyp, mild atypia, simple and complex hyperplasia,  . DILATION AND CURETTAGE OF UTERUS  11/2003   Simple and complex hyperplasia  . DILATION AND CURETTAGE OF UTERUS  2011  . DILATION AND CURETTAGE OF UTERUS N/A 12/14/2014   Procedure: DILATATION AND CURETTAGE;  Surgeon: Mora Bellman, MD;  Location: [facility];  Service: Gynecology;  Laterality: N/A;  . HOLMIUM LASER APPLICATION Left 0/62/3762   Procedure: HOLMIUM LASER APPLICATION;  Surgeon: Ardis Hughs, MD;  Location: [facility];  Service: Urology;  Laterality: Left;  . INTRAUTERINE DEVICE (IUD) INSERTION N/A 12/14/2014   Procedure: INTRAUTERINE DEVICE (IUD) INSERTION;  Surgeon: Mora Bellman, MD;  Location: [facility];  Service: Gynecology;  Laterality: N/A;   . STONE EXTRACTION WITH BASKET Left 10/19/2014   Procedure: STONE EXTRACTION WITH BASKET;  Surgeon: Ardis Hughs, MD;  Location: [facility];  Service: Urology;  Laterality: Left;  . WISDOM TOOTH EXTRACTION      Family History  Problem Relation Age of Onset  . Hypertension Other   . Diabetes Other   . Cancer Other     Social History Social History   Tobacco Use  . Smoking status: Never Smoker  . Smokeless tobacco: Never Used  Substance Use Topics  . Alcohol use: No  . Drug use: No    Allergies  Allergen Reactions  . Codeine Itching  . Peanut-Containing Drug Products Itching  . Penicillins Hives    Has patient had a PCN reaction causing immediate rash, facial/tongue/throat swelling, SOB or lightheadedness with hypotension: No Has patient had a PCN reaction causing severe rash involving mucus membranes or skin necrosis: No Has patient had a PCN reaction that required hospitalization No Has patient had a PCN reaction occurring within the last 10 years: No If all of the above answers are "NO", then may proceed with Cephalosporin use.   . Shellfish Allergy Itching    Current Outpatient Medications  Medication Sig Dispense Refill  . albuterol (PROVENTIL HFA;VENTOLIN HFA) 108 (90 Base) MCG/ACT inhaler Inhale 2 puffs into the lungs every 4 (four) hours as needed for wheezing or shortness of breath (cough, shortness of breath or wheezing.).  1 Inhaler 1  . amLODipine (NORVASC) 5 MG tablet Take 1 tablet (5 mg total) by mouth daily. 90 tablet 3  . ferrous sulfate 325 (65 FE) MG tablet Take 1 tablet (325 mg total) by mouth 2 (two) times daily with a meal. 60 tablet 3  . ibuprofen (ADVIL,MOTRIN) 200 MG tablet Take 400 mg by mouth every 6 (six) hours as needed for moderate pain.    Marland Kitchen nystatin (MYCOSTATIN/NYSTOP) powder Apply topically 3 (three) times daily. (Patient not taking: Reported on 04/30/2018) 30 g 0  . tranexamic acid (LYSTEDA) 650 MG TABS tablet Take 2 tablets (1,300 mg total)  by mouth 3 (three) times daily. Take during menses for a maximum of five days (Patient not taking: Reported on 06/17/2018) 30 tablet 2  . triamcinolone cream (KENALOG) 0.1 % Apply 1 application topically 2 (two) times daily. (Patient not taking: Reported on 05/08/2018) 30 g 0   No current facility-administered medications for this visit.     Review of Systems Review of Systems  Constitutional: Negative.   Respiratory: Negative.   Cardiovascular: Negative.   Gastrointestinal: Negative.   Genitourinary: Positive for menstrual problem and vaginal bleeding. Negative for pelvic pain.    Blood pressure (!) 191/92, pulse 88, weight (!) 327 lb (148.3 kg).  Physical Exam Physical Exam  Constitutional: She appears well-developed. No distress.  obese  Pulmonary/Chest: Effort normal.  Skin: Skin is warm and dry.  Psychiatric: She has a normal mood and affect. Her behavior is normal.  Vitals reviewed.   Data Reviewed CLINICAL DATA:  Dysfunctional uterine bleeding.  EXAM: ULTRASOUND PELVIS TRANSVAGINAL  TECHNIQUE: Transvaginal ultrasound examination of the pelvis was performed including evaluation of the uterus, ovaries, adnexal regions, and pelvic cul-de-sac.  COMPARISON:  01/12/2015  FINDINGS: Uterus  Measurements: 9.0 x 6.1 x 6.4 cm. No fibroids or other mass visualized.  Endometrium  Thickness: 16 mm in thickness. There appear to be 2 hyperechoic solid areas within the endometrium, on the left measuring 2.1 x 1.8 x 1.4 cm and on the right measuring 2.1 x 1.8 x 1.3 cm.  Right ovary  Measurements: 3.4 x 2.0 x 2.6 cm. Normal appearance/no adnexal mass.  Left ovary  Measurements: 2.7 x 1.8 x 1.6 cm. Normal appearance/no adnexal mass.  Other findings:  Small amount of free fluid in the pelvis.  IMPRESSION: Endometrium upper limits normal in thickness at 16 mm. There appear to be 2 hyperechoic solid nodules within the endometrium. Consider further  evaluation with sonohysterogram for confirmation prior to hysteroscopy. Endometrial sampling should also be considered if patient is at high risk for endometrial carcinoma. (Ref: Radiological Reasoning: Algorithmic Workup of Abnormal Vaginal Bleeding with Endovaginal Sonography and Sonohysterography. AJR 2008; 742:V95-63)   Electronically Signed   By: Rolm Baptise M.D.   On: 05/06/2018 10:22 [place] images reviewed by me CBC    Component Value Date/Time   WBC 7.7 06/09/2018 1743   WBC 7.0 12/10/2014 1630   RBC 4.15 06/09/2018 1743   RBC 4.06 12/10/2014 1630   HGB 8.8 (L) 06/09/2018 1743   HCT 29.9 (L) 06/09/2018 1743   PLT 451 (H) 06/09/2018 1743   MCV 72 (L) 06/09/2018 1743   MCH 21.2 (L) 06/09/2018 1743   MCH 20.7 (L) 12/10/2014 1630   MCHC 29.4 (L) 06/09/2018 1743   MCHC 28.9 (L) 12/10/2014 1630   RDW 14.5 06/09/2018 1743   LYMPHSABS 1.8 06/09/2018 1743   MONOABS 0.6 10/19/2014 1741   EOSABS 0.3 06/09/2018 1743   BASOSABS 0.0  06/09/2018 1743   Biopsy and path results  Assessment    DUB Possible endometrial lesions on [place] H/o polyp on Bx of endometrium    Plan    Continue DMPA Schedule SHG and f/u after resulted, consider hysteroscopy if indicated    25 min face to face, review of records and coordination of care   Emeterio Reeve 06/17/2018, 10:59 AM

## 2018-06-17 NOTE — Patient Instructions (Signed)
Sonohysterogram A sonohysterogram is a procedure to examine the inside of the uterus. This exam uses sound waves that are sent to a computer to make images of the lining of the uterus (endometrium). To get the best images, a germ-free, salt-water solution (sterile saline) is put into the uterus through the vagina. You may have this procedure if you have certain reproductive problems, such as abnormal bleeding, infertility, or miscarriage. This procedure can show what may be causing these problems. Possible causes include scarring or abnormal growths such as fibroids inside your uterus. It can also show if your uterus is an abnormal shape or if the lining of the uterus is too thin. Tell a health care provider about:  All medicines you are taking, including vitamins, herbs, eye drops, creams, and over-the-counter medicines.  Any allergies you have.  Any blood disorders you have.  Any surgeries you have had.  Any medical conditions you have.  Whether you are pregnant or may be pregnant.  The date of the first day of your last period.  Any signs of infection, such as fever, pain in your lower abdomen, or abnormal discharge from your vagina. What are the risks? Generally, this is a safe procedure. However, problems may occur, including:  Abdominal pain or cramping.  Light bleeding (spotting).  Increased vaginal discharge.  Infection. What happens before the procedure?  Your health care provider may have you take an over-the-counter pain medicine.  You may be given medicine to stop any abnormal bleeding.  You may be given antibiotic medicine to help prevent infection.  You may be asked to take a pregnancy test. This is usually in the form of a urine test.  You may have a pelvic exam.  You will be asked to empty your bladder. What happens during the procedure?  You will lie down on the exam table with your feet in stirrups or with your knees bent and your feet flat on the  table.  A slender, handheld device (transducer) will be lubricated and placed into your vagina.  The transducer will be positioned to send sound waves to your uterus. The sound waves are sent to a computer and are turned into images, which your health care provider sees during the procedure.  The transducer will be removed from your vagina.  An instrument will be inserted to widen the opening of your vagina (speculum).  A swab with germ-killing solution (antiseptic) will be used to clean the opening to your uterus (cervix).  A long, thin tube (catheter) will be placed through your cervix into your uterus.  The speculum will be removed.  The transducer will be placed back into your vagina to take more images.  Your uterus will be filled with a germ-free, salt-water solution (sterile saline) through the catheter. You may feel some cramping.  A fluid that contains air bubbles may be sent through the catheter to make it easier to see the fallopian tubes.  The transducer and catheter will be removed. The procedure may vary among health care providers and hospitals. What happens after the procedure?  It is up to you to get the results of your procedure. Ask your health care provider, or the department that is doing the procedure, when your results will be ready. Summary  A sonohysterogram is a procedure that creates images of the inside of the uterus.  The risks of this procedure are very low. Most women experience cramping and spotting after the procedure.  You may need to have a   pelvic exam and take a pregnancy test before this procedure. This procedure will not be done if you are pregnant or have an infection. This information is not intended to replace advice given to you by your health care provider. Make sure you discuss any questions you have with your health care provider. Document Released: 11/30/2013 Document Revised: 06/11/2016 Document Reviewed: 06/11/2016 Elsevier  Interactive Patient Education  2017 Elsevier Inc.  

## 2018-06-24 ENCOUNTER — Ambulatory Visit (HOSPITAL_COMMUNITY)
Admission: RE | Admit: 2018-06-24 | Discharge: 2018-06-24 | Disposition: A | Discharge status: Home / Self Care | Payer: BLUE CROSS/BLUE SHIELD | Source: Ambulatory Visit | Attending: Obstetrics & Gynecology | Admitting: Obstetrics & Gynecology

## 2018-06-24 ENCOUNTER — Encounter (HOSPITAL_COMMUNITY): Payer: Self-pay | Admitting: *Deleted

## 2018-06-24 ENCOUNTER — Inpatient Hospital Stay (HOSPITAL_COMMUNITY)
Admission: AD | Admit: 2018-06-24 | Discharge: 2018-06-24 | Disposition: A | Discharge status: Home / Self Care | Payer: BLUE CROSS/BLUE SHIELD | Source: Ambulatory Visit | Attending: Obstetrics and Gynecology | Admitting: Obstetrics and Gynecology

## 2018-06-24 DIAGNOSIS — I10 Essential (primary) hypertension: Secondary | ICD-10-CM | POA: Insufficient documentation

## 2018-06-24 DIAGNOSIS — R109 Unspecified abdominal pain: Secondary | ICD-10-CM | POA: Diagnosis present

## 2018-06-24 DIAGNOSIS — J45909 Unspecified asthma, uncomplicated: Secondary | ICD-10-CM | POA: Insufficient documentation

## 2018-06-24 DIAGNOSIS — R102 Pelvic and perineal pain: Secondary | ICD-10-CM

## 2018-06-24 DIAGNOSIS — Z8249 Family history of ischemic heart disease and other diseases of the circulatory system: Secondary | ICD-10-CM | POA: Diagnosis not present

## 2018-06-24 DIAGNOSIS — N938 Other specified abnormal uterine and vaginal bleeding: Secondary | ICD-10-CM | POA: Insufficient documentation

## 2018-06-24 DIAGNOSIS — Z885 Allergy status to narcotic agent status: Secondary | ICD-10-CM | POA: Diagnosis not present

## 2018-06-24 DIAGNOSIS — N939 Abnormal uterine and vaginal bleeding, unspecified: Secondary | ICD-10-CM

## 2018-06-24 DIAGNOSIS — Z880 Allergy status to penicillin: Secondary | ICD-10-CM | POA: Diagnosis not present

## 2018-06-24 DIAGNOSIS — K219 Gastro-esophageal reflux disease without esophagitis: Secondary | ICD-10-CM | POA: Diagnosis not present

## 2018-06-24 DIAGNOSIS — Z79899 Other long term (current) drug therapy: Secondary | ICD-10-CM | POA: Insufficient documentation

## 2018-06-24 DIAGNOSIS — Z87442 Personal history of urinary calculi: Secondary | ICD-10-CM | POA: Insufficient documentation

## 2018-06-24 LAB — COMPREHENSIVE METABOLIC PANEL
ALT: 14 U/L (ref 0–44)
AST: 11 U/L — AB (ref 15–41)
Albumin: 4.2 g/dL (ref 3.5–5.0)
Alkaline Phosphatase: 40 U/L (ref 38–126)
Anion gap: 8 (ref 5–15)
BILIRUBIN TOTAL: 0.7 mg/dL (ref 0.3–1.2)
BUN: 11 mg/dL (ref 6–20)
CO2: 23 mmol/L (ref 22–32)
CREATININE: 0.62 mg/dL (ref 0.44–1.00)
Calcium: 8.6 mg/dL — ABNORMAL LOW (ref 8.9–10.3)
Chloride: 108 mmol/L (ref 98–111)
GFR calc Af Amer: >60 mL/min (ref >60)
Glucose, Bld: 84 mg/dL (ref 70–99)
POTASSIUM: 3.9 mmol/L (ref 3.5–5.1)
Sodium: 139 mmol/L (ref 135–145)
TOTAL PROTEIN: 7.5 g/dL (ref 6.5–8.1)

## 2018-06-24 LAB — CBC WITH DIFFERENTIAL/PLATELET
BASOS ABS: 0 10*3/uL (ref 0.0–0.1)
BASOS PCT: 0 %
EOS PCT: 2 %
Eosinophils Absolute: 0.2 10*3/uL (ref 0.0–0.5)
HEMATOCRIT: 30.1 % — AB (ref 36.0–46.0)
Hemoglobin: 8.8 g/dL — ABNORMAL LOW (ref 12.0–15.0)
LYMPHS ABS: 1.3 10*3/uL (ref 0.7–4.0)
Lymphocytes Relative: 12 %
MCH: 21.6 pg — AB (ref 26.0–34.0)
MCHC: 29.2 g/dL — ABNORMAL LOW (ref 30.0–36.0)
MCV: 74 fL — AB (ref 80.0–100.0)
MONOS PCT: 4 %
Monocytes Absolute: 0.4 10*3/uL (ref 0.1–1.0)
Neutro Abs: 8.8 10*3/uL — ABNORMAL HIGH (ref 1.7–7.7)
Neutrophils Relative %: 82 %
Platelets: 350 10*3/uL (ref 150–400)
RBC: 4.07 MIL/uL (ref 3.87–5.11)
RDW: 14.9 % (ref 11.5–15.5)
WBC: 10.7 10*3/uL — ABNORMAL HIGH (ref 4.0–10.5)
nRBC: 0 % (ref 0.0–0.2)

## 2018-06-24 MED ORDER — KETOROLAC TROMETHAMINE 30 MG/ML IJ SOLN
30.0000 mg | Freq: Once | INTRAMUSCULAR | Status: AC
  Administered 2018-06-24: 30 mg via INTRAVENOUS
  Filled 2018-06-24: qty 1

## 2018-06-24 MED ORDER — LACTATED RINGERS IV SOLN
INTRAVENOUS | Status: DC
  Administered 2018-06-24: 15:00:00 via INTRAVENOUS

## 2018-06-24 MED ORDER — KETOROLAC TROMETHAMINE 10 MG PO TABS: 10.0000 mg | ORAL_TABLET | Freq: Four times a day (QID) | ORAL | 0 refills | Status: DC | PRN

## 2018-06-24 MED ORDER — DOXYCYCLINE HYCLATE 100 MG PO CAPS: 100.0000 mg | ORAL_CAPSULE | Freq: Two times a day (BID) | ORAL | 0 refills | Status: AC

## 2018-06-24 MED ORDER — OXYCODONE-ACETAMINOPHEN 5-325 MG PO TABS: 1.0000 | ORAL_TABLET | Freq: Four times a day (QID) | ORAL | 0 refills | Status: AC | PRN

## 2018-06-24 NOTE — MAU Provider Note (Signed)
History     CSN: [id number]  Arrival date and time: 06/24/18 1256   First Provider Initiated Contact with Patient 06/24/18 1401      Chief Complaint  Patient presents with  . Abdominal Pain  . Nausea   Molly Archer is a 37 y.o. G0P0000 who presents today with abdominal pain. She had a sonohysterogram this morning. Since going home she has had sharp pain that started in her lower abdomen and shoots up to her epigastric area. She vomited once from the pain. She reports bleeding, but it has been similar to the vaginal bleeding she has had all along.  Abdominal Pain  This is a new problem. The current episode started today (around 1100 this morning ). The problem occurs constantly. The problem has been gradually worsening. The pain is located in the suprapubic region, periumbilical region and epigastric region. The pain is at a severity of 8/10. The quality of the pain is sharp. Associated symptoms include nausea and vomiting. Pertinent negatives include no dysuria, fever or frequency. Nothing aggravates the pain. The pain is relieved by nothing. She has tried nothing for the symptoms. Prior diagnostic workup includes ultrasound.   OB History    Gravida  0   Para  0   Term  0   Preterm  0   AB  0   Living  0     SAB  0   TAB  0   Ectopic  0   Multiple  0   Live Births              Past Medical History:  Diagnosis Date  . Anemia   . Asthma    only uses inhaler in fall and spring  . GERD (gastroesophageal reflux disease)    diet controlled, occ. uses omeprazole  . Headache    otc med prn  . History of kidney stones   . Hx of Bell's palsy June 2013  . Hypertension    currently no meds  . Menometrorrhagia   . Psoriasis   . Shortness of breath dyspnea    with exercise/exertion    Past Surgical History:  Procedure Laterality Date  . COMBINED HYSTEROSCOPY DIAGNOSTIC / D&C N/A 2011   Benign secretory endometrium  . CYSTOSCOPY WITH STENT PLACEMENT Left  10/19/2014   Procedure: CYSTOSCOPY WITH STENT PLACEMENT;  Surgeon: Ardis Hughs, MD;  Location: [facility];  Service: Urology;  Laterality: Left;  . DILATION AND CURETTAGE OF UTERUS  10/2003   Polyp, mild atypia, simple and complex hyperplasia,  . DILATION AND CURETTAGE OF UTERUS  11/2003   Simple and complex hyperplasia  . DILATION AND CURETTAGE OF UTERUS  2011  . DILATION AND CURETTAGE OF UTERUS N/A 12/14/2014   Procedure: DILATATION AND CURETTAGE;  Surgeon: Mora Bellman, MD;  Location: [facility];  Service: Gynecology;  Laterality: N/A;  . HOLMIUM LASER APPLICATION Left 5/32/9924   Procedure: HOLMIUM LASER APPLICATION;  Surgeon: Ardis Hughs, MD;  Location: [facility];  Service: Urology;  Laterality: Left;  . INTRAUTERINE DEVICE (IUD) INSERTION N/A 12/14/2014   Procedure: INTRAUTERINE DEVICE (IUD) INSERTION;  Surgeon: Mora Bellman, MD;  Location: [facility];  Service: Gynecology;  Laterality: N/A;  . STONE EXTRACTION WITH BASKET Left 10/19/2014   Procedure: STONE EXTRACTION WITH BASKET;  Surgeon: Ardis Hughs, MD;  Location: [facility];  Service: Urology;  Laterality: Left;  . WISDOM TOOTH EXTRACTION      Family History  Problem Relation Age of Onset  .  Hypertension Other   . Diabetes Other   . Cancer Other     Social History   Tobacco Use  . Smoking status: Never Smoker  . Smokeless tobacco: Never Used  Substance Use Topics  . Alcohol use: No  . Drug use: No    Allergies:  Allergies  Allergen Reactions  . Codeine Itching  . Peanut-Containing Drug Products Itching  . Penicillins Hives    Has patient had a PCN reaction causing immediate rash, facial/tongue/throat swelling, SOB or lightheadedness with hypotension: No Has patient had a PCN reaction causing severe rash involving mucus membranes or skin necrosis: No Has patient had a PCN reaction that required hospitalization No Has patient had a PCN reaction occurring within the last 10 years: No If all of the above answers  are "NO", then may proceed with Cephalosporin use.   . Shellfish Allergy Itching    Medications Prior to Admission  Medication Sig Dispense Refill Last Dose  . albuterol (PROVENTIL HFA;VENTOLIN HFA) 108 (90 Base) MCG/ACT inhaler Inhale 2 puffs into the lungs every 4 (four) hours as needed for wheezing or shortness of breath (cough, shortness of breath or wheezing.). 1 Inhaler 1 Taking  . amLODipine (NORVASC) 5 MG tablet Take 1 tablet (5 mg total) by mouth daily. 90 tablet 3 Taking  . ferrous sulfate 325 (65 FE) MG tablet Take 1 tablet (325 mg total) by mouth 2 (two) times daily with a meal. 60 tablet 3 Taking  . ibuprofen (ADVIL,MOTRIN) 200 MG tablet Take 400 mg by mouth every 6 (six) hours as needed for moderate pain.   Taking  . nystatin (MYCOSTATIN/NYSTOP) powder Apply topically 3 (three) times daily. (Patient not taking: Reported on 04/30/2018) 30 g 0 Not Taking  . tranexamic acid (LYSTEDA) 650 MG TABS tablet Take 2 tablets (1,300 mg total) by mouth 3 (three) times daily. Take during menses for a maximum of five days (Patient not taking: Reported on 06/17/2018) 30 tablet 2 Not Taking  . triamcinolone cream (KENALOG) 0.1 % Apply 1 application topically 2 (two) times daily. (Patient not taking: Reported on 05/08/2018) 30 g 0 Not Taking    Review of Systems  Constitutional: Negative for chills and fever.  Gastrointestinal: Positive for abdominal pain, nausea and vomiting.  Genitourinary: Positive for pelvic pain and vaginal bleeding. Negative for dysuria and frequency.   Physical Exam   Blood pressure (!) 149/76, pulse 69, temperature 98.5 F (36.9 C), temperature source Oral, resp. rate 20, weight (!) 147 kg.  Physical Exam  Nursing note and vitals reviewed. Constitutional: She is oriented to person, place, and time. She appears well-developed and well-nourished. No distress.  HENT:  Head: Normocephalic.  Cardiovascular: Normal rate.  Respiratory: Effort normal.  GI: Soft. There is  no tenderness. There is no rebound.  Neurological: She is alert and oriented to person, place, and time.  Skin: Skin is warm and dry.  Psychiatric: She has a normal mood and affect.   Results for orders placed or performed during the hospital encounter of 06/24/18 (from the past 24 hour(s))  CBC with Differential/Platelet     Status: Abnormal   Collection Time: 06/24/18  2:47 PM  Result Value Ref Range   WBC 10.7 (H) 4.0 - 10.5 K/uL   RBC 4.07 3.87 - 5.11 MIL/uL   Hemoglobin 8.8 (L) 12.0 - 15.0 g/dL   HCT 30.1 (L) 36.0 - 46.0 %   MCV 74.0 (L) 80.0 - 100.0 fL   MCH 21.6 (L) 26.0 - 34.0  pg   MCHC 29.2 (L) 30.0 - 36.0 g/dL   RDW 14.9 11.5 - 15.5 %   Platelets 350 150 - 400 K/uL   nRBC 0.0 0.0 - 0.2 %   Neutrophils Relative % 82 %   Lymphocytes Relative 12 %   Monocytes Relative 4 %   Eosinophils Relative 2 %   Basophils Relative 0 %   Neutro Abs 8.8 (H) 1.7 - 7.7 K/uL   Lymphs Abs 1.3 0.7 - 4.0 K/uL   Monocytes Absolute 0.4 0.1 - 1.0 K/uL   Eosinophils Absolute 0.2 0.0 - 0.5 K/uL   Basophils Absolute 0.0 0.0 - 0.1 K/uL   RBC Morphology TEARDROP CELLS   Comprehensive metabolic panel     Status: Abnormal   Collection Time: 06/24/18  2:47 PM  Result Value Ref Range   Sodium 139 135 - 145 mmol/L   Potassium 3.9 3.5 - 5.1 mmol/L   Chloride 108 98 - 111 mmol/L   CO2 23 22 - 32 mmol/L   Glucose, Bld 84 70 - 99 mg/dL   BUN 11 6 - 20 mg/dL   Creatinine, Ser 0.62 0.44 - 1.00 mg/dL   Calcium 8.6 (L) 8.9 - 10.3 mg/dL   Total Protein 7.5 6.5 - 8.1 g/dL   Albumin 4.2 3.5 - 5.0 g/dL   AST 11 (L) 15 - 41 U/L   ALT 14 0 - 44 U/L   Alkaline Phosphatase 40 38 - 126 U/L   Total Bilirubin 0.7 0.3 - 1.2 mg/dL   GFR calc non Af Amer >60 >60 mL/min   GFR calc Af Amer >60 >60 mL/min   Anion gap 8 5 - 15    MAU Course  Procedures  MDM 2:12 PM Consult with Dr. Ilda Basset, will get CBC, CMET, Start IV, and given toradol for pain at this time.  4:15 PM Reviewed results with Dr. Ilda Basset. [place] for  DC home. Send with Doxycycline  100mg  BID x 7 days oral toradol, percocet for pain  Assessment and Plan   1. Pelvic pain   2. Vagina bleeding   3. DUB (dysfunctional uterine bleeding)    DC home Comfort measures reviewed  Bleeding precautions RX: doxycycline 100mg  BID x 7 days, toradol 10mg  PRN #20, Percocet PRN #6  Return to MAU as needed FU with OB as planned  Follow-up Information    Woodroe Mode, MD Follow up.   Specialty:  Obstetrics and Gynecology Why:  The office will call you with an appointment time for monday  Contact information: [place] [address] [place] 06/24/2018, 2:03 PM

## 2018-06-24 NOTE — MAU Note (Signed)
Pt had sonohysterogram this morning @ 0930, after she got home she began having pelvic pain which has escalated & is going up into her abdomen, is very nauseated.  Having small amount of bleeding.

## 2018-06-25 ENCOUNTER — Ambulatory Visit: Payer: BLUE CROSS/BLUE SHIELD | Admitting: Advanced Practice Midwife

## 2018-06-30 ENCOUNTER — Encounter: Payer: Self-pay | Admitting: Obstetrics & Gynecology

## 2018-06-30 ENCOUNTER — Ambulatory Visit (INDEPENDENT_AMBULATORY_CARE_PROVIDER_SITE_OTHER): Payer: BLUE CROSS/BLUE SHIELD | Admitting: Obstetrics & Gynecology

## 2018-06-30 VITALS — BP 158/77 | HR 72 | Ht 65.0 in | Wt 324.7 lb

## 2018-06-30 DIAGNOSIS — N938 Other specified abnormal uterine and vaginal bleeding: Secondary | ICD-10-CM | POA: Diagnosis not present

## 2018-06-30 MED ORDER — MEDROXYPROGESTERONE ACETATE 10 MG PO TABS: 20.0000 mg | ORAL_TABLET | Freq: Every day | ORAL | 2 refills | Status: DC

## 2018-06-30 NOTE — Progress Notes (Signed)
Patient ID: Molly Archer, female   DOB: July 04, 1981, 37 y.o.   MRN: [id number]  Chief Complaint  Patient presents with  . Follow-up    HPI Molly Archer is a 37 y.o. female.  [id number] Patient's last menstrual period was 06/18/2018 (approximate). Her bleeding has decreased and her SHG showed thick endometrium but not focal lesion. She had a d&c 2016 for DUB and tissue was benign. No plan to conceive soon HPI  Past Medical History:  Diagnosis Date  . Anemia   . Asthma    only uses inhaler in fall and spring  . GERD (gastroesophageal reflux disease)    diet controlled, occ. uses omeprazole  . Headache    otc med prn  . History of kidney stones   . Hx of Bell's palsy June 2013  . Hypertension    currently no meds  . Menometrorrhagia   . Psoriasis   . Shortness of breath dyspnea    with exercise/exertion    Past Surgical History:  Procedure Laterality Date  . COMBINED HYSTEROSCOPY DIAGNOSTIC / D&C N/A 2011   Benign secretory endometrium  . CYSTOSCOPY WITH STENT PLACEMENT Left 10/19/2014   Procedure: CYSTOSCOPY WITH STENT PLACEMENT;  Surgeon: Ardis Hughs, MD;  Location: [facility];  Service: Urology;  Laterality: Left;  . DILATION AND CURETTAGE OF UTERUS  10/2003   Polyp, mild atypia, simple and complex hyperplasia,  . DILATION AND CURETTAGE OF UTERUS  11/2003   Simple and complex hyperplasia  . DILATION AND CURETTAGE OF UTERUS  2011  . DILATION AND CURETTAGE OF UTERUS N/A 12/14/2014   Procedure: DILATATION AND CURETTAGE;  Surgeon: Mora Bellman, MD;  Location: [facility];  Service: Gynecology;  Laterality: N/A;  . HOLMIUM LASER APPLICATION Left 08/13/7260   Procedure: HOLMIUM LASER APPLICATION;  Surgeon: Ardis Hughs, MD;  Location: [facility];  Service: Urology;  Laterality: Left;  . INTRAUTERINE DEVICE (IUD) INSERTION N/A 12/14/2014   Procedure: INTRAUTERINE DEVICE (IUD) INSERTION;  Surgeon: Mora Bellman, MD;  Location: [facility];  Service: Gynecology;  Laterality: N/A;   . STONE EXTRACTION WITH BASKET Left 10/19/2014   Procedure: STONE EXTRACTION WITH BASKET;  Surgeon: Ardis Hughs, MD;  Location: [facility];  Service: Urology;  Laterality: Left;  . WISDOM TOOTH EXTRACTION      Family History  Problem Relation Age of Onset  . Hypertension Other   . Diabetes Other   . Cancer Other     Social History Social History   Tobacco Use  . Smoking status: Never Smoker  . Smokeless tobacco: Never Used  Substance Use Topics  . Alcohol use: No  . Drug use: No    Allergies  Allergen Reactions  . Codeine Itching  . Peanut-Containing Drug Products Itching  . Penicillins Hives    Has patient had a PCN reaction causing immediate rash, facial/tongue/throat swelling, SOB or lightheadedness with hypotension: No Has patient had a PCN reaction causing severe rash involving mucus membranes or skin necrosis: No Has patient had a PCN reaction that required hospitalization No Has patient had a PCN reaction occurring within the last 10 years: No If all of the above answers are "NO", then may proceed with Cephalosporin use.   . Shellfish Allergy Itching    Current Outpatient Medications  Medication Sig Dispense Refill  . albuterol (PROVENTIL HFA;VENTOLIN HFA) 108 (90 Base) MCG/ACT inhaler Inhale 2 puffs into the lungs every 4 (four) hours as needed for wheezing or shortness of breath (cough, shortness of breath  or wheezing.). 1 Inhaler 1  . amLODipine (NORVASC) 5 MG tablet Take 1 tablet (5 mg total) by mouth daily. 90 tablet 3  . doxycycline (VIBRAMYCIN) 100 MG capsule Take 1 capsule (100 mg total) by mouth 2 (two) times daily for 7 days. 14 capsule 0  . ferrous sulfate 325 (65 FE) MG tablet Take 1 tablet (325 mg total) by mouth 2 (two) times daily with a meal. 60 tablet 3  . ketorolac (TORADOL) 10 MG tablet Take 1 tablet (10 mg total) by mouth every 6 (six) hours as needed. 20 tablet 0  . triamcinolone cream (KENALOG) 0.1 % Apply 1 application topically 2 (two)  times daily. 30 g 0  . medroxyPROGESTERone (PROVERA) 10 MG tablet Take 2 tablets (20 mg total) by mouth daily. 30 tablet 2  . nystatin (MYCOSTATIN/NYSTOP) powder Apply topically 3 (three) times daily. (Patient not taking: Reported on 04/30/2018) 30 g 0  . tranexamic acid (LYSTEDA) 650 MG TABS tablet Take 2 tablets (1,300 mg total) by mouth 3 (three) times daily. Take during menses for a maximum of five days (Patient not taking: Reported on 06/17/2018) 30 tablet 2   No current facility-administered medications for this visit.     Review of Systems Review of Systems  Blood pressure (!) 158/77, pulse 72, height 5\' 5"  (1.651 m), weight (!) 324 lb 11.2 oz (147.3 kg), last menstrual period 06/18/2018.  Physical Exam Physical Exam  Constitutional: She is oriented to person, place, and time. She appears well-developed. No distress.  Pulmonary/Chest: Effort normal.  Neurological: She is alert and oriented to person, place, and time.  Psychiatric: She has a normal mood and affect.  Vitals reviewed.   Data Reviewed CLINICAL DATA:  37 year old female with dysfunctional uterine bleeding. Question focal endometrial lesions on recent pelvic sonogram.  EXAM: [place] SONOHYSTEROGRAM  TECHNIQUE: Following cleansing of the cervix and vagina with Betadine, a hysterosalpingogram catheter was placed within the endocervical canal. Sonohysterogram was then performed with transvaginal sonography during infusion of sterile saline solution into the endometrial cavity.  COMPARISON:  05/06/2018 pelvic sonogram.  FINDINGS: Sonohysterography demonstrates prominent very irregular soft tissue thickening of the entire endometrial cavity, without a discrete mass. Bilayer endometrial thickness 25 mm on today's scan. There is scattered vascularity throughout the regions of soft tissue thickening in the endometrial cavity on color Doppler, without a dominant feeding vessel. An asymmetric 2.5 x 1.9 x 2.3 cm  irregular focus of soft tissue thickening in the right fundal endometrial cavity is contiguous with the irregular regions of soft tissue thickening throughout the uterine cavity, and is not considered a discrete endometrial mass. No submucosal or intracavitary fibroids.  The procedure was well tolerated by the patient, with no immediate complication.  IMPRESSION: Very irregular and prominent soft tissue thickening of the entire endometrial cavity with associated scattered endometrial vascularity on color Doppler, without discrete endometrial mass. Bilayer endometrial thickness 25 mm. Imaging differential diagnosis includes endometrial hyperplasia or endometrial carcinoma. Consider endometrial tissue sampling.   Electronically Signed   By: Ilona Sorrel M.D.   On: 06/24/2018 10:54  CBC    Component Value Date/Time   WBC 10.7 (H) 06/24/2018 1447   RBC 4.07 06/24/2018 1447   HGB 8.8 (L) 06/24/2018 1447   HGB 8.8 (L) 06/09/2018 1743   HCT 30.1 (L) 06/24/2018 1447   HCT 29.9 (L) 06/09/2018 1743   PLT 350 06/24/2018 1447   PLT 451 (H) 06/09/2018 1743   MCV 74.0 (L) 06/24/2018 1447   MCV 72 (  L) 06/09/2018 1743   MCH 21.6 (L) 06/24/2018 1447   MCHC 29.2 (L) 06/24/2018 1447   RDW 14.9 06/24/2018 1447   RDW 14.5 06/09/2018 1743   LYMPHSABS 1.3 06/24/2018 1447   LYMPHSABS 1.8 06/09/2018 1743   MONOABS 0.4 06/24/2018 1447   EOSABS 0.2 06/24/2018 1447   EOSABS 0.3 06/09/2018 1743   BASOSABS 0.0 06/24/2018 1447   BASOSABS 0.0 06/09/2018 1743    Assessment    Patient Active Problem List   Diagnosis Date Noted  . DUB (dysfunctional uterine bleeding)   . Menometrorrhagia   . Obesity 09/08/2012  . Hypertension 02/25/2012  . Hx of Bell's palsy 02/25/2012  Anemia, taking iron Has had multiple D&C     Plan    Continue DMPA, add 10 mg PO provera for 30 days RTC to discuss progress in 3 mo, consider f/u US to assess endometrium 4-6 mo        Emeterio Reeve 06/30/2018,  11:28 AM

## 2018-06-30 NOTE — Patient Instructions (Signed)
Hormonal Contraception Information °Hormonal contraception is a type of birth control that uses hormones to prevent pregnancy. It usually involves a combination of the hormones estrogen and progesterone or only the hormone progesterone. Hormonal contraception works in these ways: °· It thickens the mucus in the cervix, making it harder for sperm to enter the uterus. °· It changes the lining of the uterus, making it harder for an egg to implant. °· It may stop the ovaries from releasing eggs (ovulation). Some women who take hormonal contraceptives that contain only progesterone may continue to ovulate. ° °Hormonal contraception cannot prevent sexually transmitted infections (STIs). Pregnancy may still occur. °Estrogen and progesterone contraceptives °Contraceptives that use a combination of estrogen and progesterone are available in these forms: °· Pill. Pills come in different combinations of hormones. They must be taken at the same time each day. Pills can affect your period, causing you to get your period once every three months or not at all. °· Patch. The patch must be worn on the lower abdomen for three weeks and then removed on the fourth. °· Vaginal ring. The ring is placed in the vagina and left there for three weeks. It is then removed for one week. ° °Progesterone contraceptives °Contraceptives that use progesterone only are available in these forms: °· Pill. Pills should be taken every day of the cycle. °· Intrauterine device (IUD). This device is inserted into the uterus and removed or replaced every five years or sooner. °· Implant. Plastic rods are placed under the skin of the upper arm. They are removed or replaced every three years or sooner. °· Injection. The injection is given once every 90 days. ° °What are the side effects? °The side effects of estrogen and progesterone contraceptives include: °· Nausea. °· Headaches. °· Breast tenderness. °· Bleeding or spotting between menstrual cycles. °· High  blood pressure (rare). °· Strokes, heart attacks, or blood clots (rare) ° °Side effects of progesterone-only contraceptives include: °· Nausea. °· Headaches. °· Breast tenderness. °· Unpredictable menstrual bleeding. °· High blood pressure (rare). ° °Talk to your health care provider about what side effects may affect you. °Where to find more information: °· Ask your health care provider for more information and resources about hormonal contraception. °· [place] Department of Health and Human Services Office on Women's Health: www.womenshealth.gov °Questions to ask: °· What type of hormonal contraception is right for me? °· How long should I plan to use hormonal contraception? °· What are the side effects of the hormonal contraception method I choose? °· How can I prevent STIs while using hormonal contraception? °Contact a health care provider if: °· You start taking hormonal contraceptives and you develop persistent or severe side effects. °Summary °· Estrogen and progesterone are hormones used in many forms of birth control. °· Talk to your health care provider about what side effects may affect you. °· Hormonal contraception cannot prevent sexually transmitted infections (STIs). °· Ask your health care provider for more information and resources about hormonal contraception. °This information is not intended to replace advice given to you by your health care provider. Make sure you discuss any questions you have with your health care provider. °Document Released: 08/05/2007 Document Revised: 06/15/2016 Document Reviewed: 06/15/2016 °Elsevier Interactive Patient Education © 2018 [employer] ° °

## 2018-06-30 NOTE — Progress Notes (Signed)
Pt states is still spotting.

## 2018-07-16 ENCOUNTER — Encounter (INDEPENDENT_AMBULATORY_CARE_PROVIDER_SITE_OTHER): Payer: BLUE CROSS/BLUE SHIELD

## 2018-07-16 ENCOUNTER — Encounter (INDEPENDENT_AMBULATORY_CARE_PROVIDER_SITE_OTHER): Payer: Self-pay

## 2018-07-20 ENCOUNTER — Inpatient Hospital Stay (HOSPITAL_COMMUNITY): Payer: BLUE CROSS/BLUE SHIELD

## 2018-07-20 ENCOUNTER — Observation Stay (HOSPITAL_COMMUNITY)
Admission: AD | Admit: 2018-07-20 | Discharge: 2018-07-21 | Disposition: A | Discharge status: Home / Self Care | Payer: BLUE CROSS/BLUE SHIELD | Source: Ambulatory Visit | Attending: Obstetrics & Gynecology | Admitting: Obstetrics & Gynecology

## 2018-07-20 ENCOUNTER — Encounter (HOSPITAL_COMMUNITY): Payer: Self-pay | Admitting: *Deleted

## 2018-07-20 ENCOUNTER — Other Ambulatory Visit: Payer: Self-pay

## 2018-07-20 DIAGNOSIS — I10 Essential (primary) hypertension: Secondary | ICD-10-CM | POA: Diagnosis not present

## 2018-07-20 DIAGNOSIS — Z6841 Body Mass Index (BMI) 40.0 and over, adult: Secondary | ICD-10-CM | POA: Diagnosis not present

## 2018-07-20 DIAGNOSIS — D649 Anemia, unspecified: Secondary | ICD-10-CM | POA: Diagnosis not present

## 2018-07-20 DIAGNOSIS — N840 Polyp of corpus uteri: Secondary | ICD-10-CM | POA: Insufficient documentation

## 2018-07-20 DIAGNOSIS — R9389 Abnormal findings on diagnostic imaging of other specified body structures: Secondary | ICD-10-CM | POA: Diagnosis not present

## 2018-07-20 DIAGNOSIS — Z87442 Personal history of urinary calculi: Secondary | ICD-10-CM | POA: Insufficient documentation

## 2018-07-20 DIAGNOSIS — Z79899 Other long term (current) drug therapy: Secondary | ICD-10-CM | POA: Diagnosis not present

## 2018-07-20 DIAGNOSIS — J45909 Unspecified asthma, uncomplicated: Secondary | ICD-10-CM | POA: Diagnosis not present

## 2018-07-20 DIAGNOSIS — N939 Abnormal uterine and vaginal bleeding, unspecified: Secondary | ICD-10-CM | POA: Diagnosis present

## 2018-07-20 DIAGNOSIS — N921 Excessive and frequent menstruation with irregular cycle: Secondary | ICD-10-CM | POA: Diagnosis not present

## 2018-07-20 DIAGNOSIS — Z8742 Personal history of other diseases of the female genital tract: Secondary | ICD-10-CM

## 2018-07-20 DIAGNOSIS — N938 Other specified abnormal uterine and vaginal bleeding: Principal | ICD-10-CM | POA: Insufficient documentation

## 2018-07-20 DIAGNOSIS — K219 Gastro-esophageal reflux disease without esophagitis: Secondary | ICD-10-CM | POA: Insufficient documentation

## 2018-07-20 DIAGNOSIS — R102 Pelvic and perineal pain: Secondary | ICD-10-CM

## 2018-07-20 DIAGNOSIS — Z793 Long term (current) use of hormonal contraceptives: Secondary | ICD-10-CM | POA: Insufficient documentation

## 2018-07-20 LAB — CBC
HCT: 32.1 % — ABNORMAL LOW (ref 36.0–46.0)
Hemoglobin: 9.1 g/dL — ABNORMAL LOW (ref 12.0–15.0)
MCH: 20.4 pg — AB (ref 26.0–34.0)
MCHC: 28.3 g/dL — ABNORMAL LOW (ref 30.0–36.0)
MCV: 72.1 fL — ABNORMAL LOW (ref 80.0–100.0)
NRBC: 0 % (ref 0.0–0.2)
PLATELETS: 384 10*3/uL (ref 150–400)
RBC: 4.45 MIL/uL (ref 3.87–5.11)
RDW: 15.3 % (ref 11.5–15.5)
WBC: 8 10*3/uL (ref 4.0–10.5)

## 2018-07-20 LAB — COMPREHENSIVE METABOLIC PANEL
ALT: 18 U/L (ref 0–44)
AST: 20 U/L (ref 15–41)
Albumin: 4.4 g/dL (ref 3.5–5.0)
Alkaline Phosphatase: 37 U/L — ABNORMAL LOW (ref 38–126)
Anion gap: 9 (ref 5–15)
BUN: 12 mg/dL (ref 6–20)
CO2: 24 mmol/L (ref 22–32)
Calcium: 9.1 mg/dL (ref 8.9–10.3)
Chloride: 106 mmol/L (ref 98–111)
Creatinine, Ser: 0.64 mg/dL (ref 0.44–1.00)
GFR calc Af Amer: >60 mL/min (ref >60)
GFR calc non Af Amer: >60 mL/min (ref >60)
GLUCOSE: 141 mg/dL — AB (ref 70–99)
Potassium: 4.3 mmol/L (ref 3.5–5.1)
Sodium: 139 mmol/L (ref 135–145)
Total Bilirubin: 0.8 mg/dL (ref 0.3–1.2)
Total Protein: 7.6 g/dL (ref 6.5–8.1)

## 2018-07-20 MED ORDER — ONDANSETRON HCL 4 MG/2ML IJ SOLN: 4.0000 mg | Freq: Four times a day (QID) | INTRAMUSCULAR | Status: DC | PRN

## 2018-07-20 MED ORDER — NORETHINDRONE ACETATE 5 MG PO TABS
15.0000 mg | ORAL_TABLET | ORAL | Status: AC
  Administered 2018-07-21: 15 mg via ORAL
  Filled 2018-07-20: qty 3

## 2018-07-20 MED ORDER — SODIUM CHLORIDE 0.9 % IV SOLN
INTRAVENOUS | Status: DC
  Administered 2018-07-20: 10 mL/h via INTRAVENOUS

## 2018-07-20 MED ORDER — ONDANSETRON 8 MG PO TBDP
8.0000 mg | ORAL_TABLET | Freq: Once | ORAL | Status: AC
  Administered 2018-07-20: 8 mg via ORAL
  Filled 2018-07-20: qty 1

## 2018-07-20 MED ORDER — HYDROMORPHONE HCL 1 MG/ML IJ SOLN: 1.0000 mg | INTRAMUSCULAR | Status: DC | PRN

## 2018-07-20 MED ORDER — PROMETHAZINE HCL 25 MG/ML IJ SOLN
25.0000 mg | Freq: Four times a day (QID) | INTRAMUSCULAR | Status: DC | PRN
  Administered 2018-07-20: 25 mg via INTRAVENOUS
  Filled 2018-07-20: qty 1

## 2018-07-20 MED ORDER — TRANEXAMIC ACID-NACL 1000-0.7 MG/100ML-% IV SOLN
1000.0000 mg | Freq: Once | INTRAVENOUS | Status: AC
  Administered 2018-07-20: 1000 mg via INTRAVENOUS
  Filled 2018-07-20: qty 100

## 2018-07-20 MED ORDER — KETOROLAC TROMETHAMINE 60 MG/2ML IM SOLN
60.0000 mg | Freq: Once | INTRAMUSCULAR | Status: AC
  Administered 2018-07-20: 60 mg via INTRAMUSCULAR
  Filled 2018-07-20: qty 2

## 2018-07-20 MED ORDER — HYDROMORPHONE HCL 2 MG/ML IJ SOLN
2.0000 mg | INTRAMUSCULAR | Status: DC | PRN
  Administered 2018-07-20: 2 mg via INTRAVENOUS
  Filled 2018-07-20: qty 1

## 2018-07-20 MED ORDER — SODIUM CHLORIDE 0.9 % IV BOLUS
500.0000 mL | Freq: Once | INTRAVENOUS | Status: AC
  Administered 2018-07-20: 500 mL via INTRAVENOUS

## 2018-07-20 NOTE — MAU Note (Addendum)
Pt presents to MAU c/o heavy vaginal bleeding and abdominal pain that is a 10/10. Pt states she had a ultrasound a few weeks ago and the pain started after the [place] and has not resolved since then. Pt states she is filling a pad 4times in one hour with clots that are a little smaller than a golf ball. Pt states she has nausea and vomiting and diarrhea. Pt took 2depo shots in OCT and is taking progesterone 2 times a day for the last 3 weeks and the bleeding is not resolving.

## 2018-07-20 NOTE — H&P (Addendum)
Chief Complaint: Vaginal Bleeding and Abdominal Pain     History of Present Illness: Molly Archer is a 37 y.o. G0 with hx HTN, obesity BMI 35, AUB with thickened endometrium who presents to maternity admissions reporting prolonged heavy bleeding and pain since her sonohysterogram on 06/24/2018 that worsened in the last 24 hours. She has heavy bleeding today, soaking a pad every hour all day. Usually her bleeding is less during the day but is heavy at night. She is taking Provera 10 mg daily and had 2 Depo Provera injections in October for bleeding.  She is on Norvasc for HTN but did not take her dose today. The pain is low in her abdomen, bilateral, intermittent in waves with the bleeding and associated with n/v.  There are no other symptoms. She has not tried any other treatments recently.  Of note, patient has history of multiple Hysteroscopy with D&Cs in the past, had been diagnosed with simple and complex endometrial hyperplasia with atypia and has been on multiple progestin regimens/modalities over the years.  Past Medical History:  Diagnosis Date  . Anemia   . Asthma    only uses inhaler in fall and spring  . GERD (gastroesophageal reflux disease)    diet controlled, occ. uses omeprazole  . Headache    otc med prn  . History of kidney stones   . Hx of Bell's palsy June 2013  . Hypertension    currently no meds  . Menometrorrhagia   . Psoriasis   . Shortness of breath dyspnea    with exercise/exertion   Past Surgical History:  Procedure Laterality Date  . COMBINED HYSTEROSCOPY DIAGNOSTIC / D&C N/A 2011   Benign secretory endometrium  . CYSTOSCOPY WITH STENT PLACEMENT Left 10/19/2014   Procedure: CYSTOSCOPY WITH STENT PLACEMENT;  Surgeon: Ardis Hughs, MD;  Location: [facility];  Service: Urology;  Laterality: Left;  . DILATION AND CURETTAGE OF UTERUS  10/2003   Polyp, mild atypia, simple and complex hyperplasia,  . DILATION AND CURETTAGE OF UTERUS  11/2003   Simple and  complex hyperplasia  . DILATION AND CURETTAGE OF UTERUS  2011  . DILATION AND CURETTAGE OF UTERUS N/A 12/14/2014   Procedure: DILATATION AND CURETTAGE;  Surgeon: Mora Bellman, MD;  Location: [facility];  Service: Gynecology;  Laterality: N/A;  . HOLMIUM LASER APPLICATION Left 6/38/4665   Procedure: HOLMIUM LASER APPLICATION;  Surgeon: Ardis Hughs, MD;  Location: [facility];  Service: Urology;  Laterality: Left;  . INTRAUTERINE DEVICE (IUD) INSERTION N/A 12/14/2014   Procedure: INTRAUTERINE DEVICE (IUD) INSERTION;  Surgeon: Mora Bellman, MD;  Location: [facility];  Service: Gynecology;  Laterality: N/A;  . STONE EXTRACTION WITH BASKET Left 10/19/2014   Procedure: STONE EXTRACTION WITH BASKET;  Surgeon: Ardis Hughs, MD;  Location: [facility];  Service: Urology;  Laterality: Left;  . WISDOM TOOTH EXTRACTION       Social History         Socioeconomic History  . Marital status: Single      Spouse name: Not on file  . Number of children: Not on file  . Years of education: Not on file  . Highest education level: Not on file  Occupational History  . Not on file  Social Needs  . Financial resource strain: Not very hard  . Food insecurity:      Worry: Never true      Inability: Never true  . Transportation needs:      Medical: No  Non-medical: No  Tobacco Use  . Smoking status: Never Smoker  . Smokeless tobacco: Never Used  Substance and Sexual Activity  . Alcohol use: No  . Drug use: No  . Sexual activity: Not Currently      Birth control/protection: None  Lifestyle  . Physical activity:      Days per week: 0 days      Minutes per session: 0 min  . Stress: Not at all  Relationships  . Social connections:      Talks on phone: More than three times a week      Gets together: Once a week      Attends religious service: More than 4 times per year      Active member of club or organization: Yes      Attends meetings of clubs or organizations: More than 4 times per year       Relationship status: Never married  . Intimate partner violence:      Fear of current or ex partner: Not on file      Emotionally abused: Not on file      Physically abused: Not on file      Forced sexual activity: Not on file  Other Topics Concern  . Not on file  Social History Narrative    She is from [place], [place], moved here 1998.     Lives at home by herself.     No current facility-administered medications on file prior to encounter.           Current Outpatient Medications on File Prior to Encounter  Medication Sig Dispense Refill  . medroxyPROGESTERone (PROVERA) 10 MG tablet Take 2 tablets (20 mg total) by mouth daily. 30 tablet 2  . oxyCODONE-acetaminophen (PERCOCET) 10-325 MG tablet Take 1 tablet by mouth every 4 (four) hours as needed for pain.      Marland Kitchen albuterol (PROVENTIL HFA;VENTOLIN HFA) 108 (90 Base) MCG/ACT inhaler Inhale 2 puffs into the lungs every 4 (four) hours as needed for wheezing or shortness of breath (cough, shortness of breath or wheezing.). 1 Inhaler 1  . amLODipine (NORVASC) 5 MG tablet Take 1 tablet (5 mg total) by mouth daily. 90 tablet 3  . ferrous sulfate 325 (65 FE) MG tablet Take 1 tablet (325 mg total) by mouth 2 (two) times daily with a meal. 60 tablet 3  . ketorolac (TORADOL) 10 MG tablet Take 1 tablet (10 mg total) by mouth every 6 (six) hours as needed. 20 tablet 0  . nystatin (MYCOSTATIN/NYSTOP) powder Apply topically 3 (three) times daily. (Patient not taking: Reported on 04/30/2018) 30 g 0  . tranexamic acid (LYSTEDA) 650 MG TABS tablet Take 2 tablets (1,300 mg total) by mouth 3 (three) times daily. Take during menses for a maximum of five days (Patient not taking: Reported on 06/17/2018) 30 tablet 2  . triamcinolone cream (KENALOG) 0.1 % Apply 1 application topically 2 (two) times daily. 30 g 0         Allergies  Allergen Reactions  . Codeine Itching  . Peanut-Containing Drug Products Itching  . Penicillins Hives      Has patient had a PCN  reaction causing immediate rash, facial/tongue/throat swelling, SOB or lightheadedness with hypotension: No Has patient had a PCN reaction causing severe rash involving mucus membranes or skin necrosis: No Has patient had a PCN reaction that required hospitalization No Has patient had a PCN reaction occurring within the last 10 years: No If all of the  above answers are "NO", then may proceed with Cephalosporin use.    . Shellfish Allergy Itching      ROS:  Review of Systems  Constitutional: Negative for chills, fatigue and fever.  Respiratory: Negative for shortness of breath.   Cardiovascular: Negative for chest pain.  Gastrointestinal: Positive for abdominal pain, nausea and vomiting.  Genitourinary: Positive for pelvic pain and vaginal bleeding. Negative for difficulty urinating, dysuria, flank pain, vaginal discharge and vaginal pain.  Musculoskeletal: Positive for back pain.  Neurological: Negative for dizziness and headaches.  Psychiatric/Behavioral: Negative.        I have reviewed patient's Past Medical Hx, Surgical Hx, Family Hx, Social Hx, medications and allergies.    Physical Exam   Patient Vitals for the past 24 hrs:  BP Temp Temp src Pulse Resp SpO2 Height Weight  07/20/18 2354 130/69 - - 73 - - - -  07/20/18 2351 - 98.1 F (36.7 C) Oral - - - - -  07/20/18 2025 (!) 184/93 - - 82 - 98 % - -   Constitutional: Well-developed, well-nourished female in no acute distress.  Cardiovascular: normal rate Respiratory: normal effort GI: Abd soft, non-tender. Pos BS x 4 MS: Extremities nontender, no edema, normal ROM Neurologic: Alert and oriented x 4.  GU: Neg CVAT.   PELVIC EXAM: Attempted pelvic exam but pt did not tolerate due to pain.  Will give additional medications and consider exam again.     LAB RESULTS   Results for orders placed or performed during the hospital encounter of 07/20/18 (from the past 24 hour(s))  CBC     Status: Abnormal   Collection Time:  07/20/18  9:09 PM  Result Value Ref Range   WBC 8.0 4.0 - 10.5 K/uL   RBC 4.45 3.87 - 5.11 MIL/uL   Hemoglobin 9.1 (L) 12.0 - 15.0 g/dL   HCT 32.1 (L) 36.0 - 46.0 %   MCV 72.1 (L) 80.0 - 100.0 fL   MCH 20.4 (L) 26.0 - 34.0 pg   MCHC 28.3 (L) 30.0 - 36.0 g/dL   RDW 15.3 11.5 - 15.5 %   Platelets 384 150 - 400 K/uL   nRBC 0.0 0.0 - 0.2 %  Prepare RBC     Status: None   Collection Time: 07/20/18  9:59 PM  Result Value Ref Range   Order Confirmation      ORDER PROCESSED BY BLOOD BANK Performed at [facility], [address], [place], [place] [address]   Type and screen     Status: None (Preliminary result)   Collection Time: 07/20/18 11:07 PM  Result Value Ref Range   ABO/RH(D) O POS    Antibody Screen NEG    Sample Expiration      07/23/2018 Performed at [facility], [address] [place], [place] [address]    Unit Number [id number]    Blood Component Type RED CELLS,LR    Unit division 00    Status of Unit ALLOCATED    Transfusion Status OK TO TRANSFUSE    Crossmatch Result Compatible    Unit Number [id number]    Blood Component Type RED CELLS,LR    Unit division 00    Status of Unit ALLOCATED    Transfusion Status OK TO TRANSFUSE    Crossmatch Result Compatible    Unit Number [id number]    Blood Component Type RED CELLS,LR    Unit division 00    Status of Unit ALLOCATED    Transfusion Status OK TO TRANSFUSE  Crossmatch Result Compatible   TSH     Status: None   Collection Time: 07/20/18 11:07 PM  Result Value Ref Range   TSH 0.615 0.350 - 4.500 uIU/mL  hCG, quantitative, pregnancy     Status: None   Collection Time: 07/20/18 11:07 PM  Result Value Ref Range   hCG, Beta Chain, Quant, S <1 <5 mIU/mL  Comprehensive metabolic panel     Status: Abnormal   Collection Time: 07/20/18 11:07 PM  Result Value Ref Range   Sodium 139 135 - 145 mmol/L   Potassium 4.3 3.5 - 5.1 mmol/L   Chloride 106 98 - 111 mmol/L   CO2 24 22 - 32 mmol/L   Glucose,  Bld 141 (H) 70 - 99 mg/dL   BUN 12 6 - 20 mg/dL   Creatinine, Ser 0.64 0.44 - 1.00 mg/dL   Calcium 9.1 8.9 - 10.3 mg/dL   Total Protein 7.6 6.5 - 8.1 g/dL   Albumin 4.4 3.5 - 5.0 g/dL   AST 20 15 - 41 U/L   ALT 18 0 - 44 U/L   Alkaline Phosphatase 37 (L) 38 - 126 U/L   Total Bilirubin 0.8 0.3 - 1.2 mg/dL   GFR calc non Af Amer >60 >60 mL/min   GFR calc Af Amer >60 >60 mL/min   Anion gap 9 5 - 15  ABO/Rh     Status: None   Collection Time: 07/20/18 11:07 PM  Result Value Ref Range   ABO/RH(D)      O POS Performed at [facility], [address] [place], [place] [address]   Urinalysis, Routine w reflex microscopic     Status: Abnormal   Collection Time: 07/21/18  5:36 AM  Result Value Ref Range   Color, Urine YELLOW YELLOW   APPearance CLEAR CLEAR   Specific Gravity, Urine 1.025 1.005 - 1.030   pH 6.0 5.0 - 8.0   Glucose, UA NEGATIVE NEGATIVE mg/dL   Hgb urine dipstick LARGE (A) NEGATIVE   Bilirubin Urine NEGATIVE NEGATIVE   Ketones, ur NEGATIVE NEGATIVE mg/dL   Protein, ur 100 (A) NEGATIVE mg/dL   Nitrite NEGATIVE NEGATIVE   Leukocytes, UA TRACE (A) NEGATIVE   RBC / HPF >50 (H) 0 - 5 RBC/hpf   WBC, UA 11-20 0 - 5 WBC/hpf   Bacteria, UA NONE SEEN NONE SEEN   Squamous Epithelial / LPF 0-5 0 - 5   Mucus PRESENT    Non Squamous Epithelial 0-5 (A) NONE SEEN  CBC WITH DIFFERENTIAL     Status: Abnormal   Collection Time: 07/21/18  5:55 AM  Result Value Ref Range   WBC 7.6 4.0 - 10.5 K/uL   RBC 4.10 3.87 - 5.11 MIL/uL   Hemoglobin 8.4 (L) 12.0 - 15.0 g/dL   HCT 29.8 (L) 36.0 - 46.0 %   MCV 72.7 (L) 80.0 - 100.0 fL   MCH 20.5 (L) 26.0 - 34.0 pg   MCHC 28.2 (L) 30.0 - 36.0 g/dL   RDW 15.5 11.5 - 15.5 %   Platelets 387 150 - 400 K/uL   nRBC 0.0 0.0 - 0.2 %   Neutrophils Relative % 79 %   Neutro Abs 5.9 1.7 - 7.7 K/uL   Lymphocytes Relative 16 %   Lymphs Abs 1.2 0.7 - 4.0 K/uL   Monocytes Relative 5 %   Monocytes Absolute 0.4 0.1 - 1.0 K/uL   Eosinophils Relative 0  %   Eosinophils Absolute 0.0 0.0 - 0.5 K/uL   Basophils  Relative 0 %   Basophils Absolute 0.0 0.0 - 0.1 K/uL  Comprehensive metabolic panel     Status: Abnormal   Collection Time: 07/21/18  5:55 AM  Result Value Ref Range   Sodium 138 135 - 145 mmol/L   Potassium 3.8 3.5 - 5.1 mmol/L   Chloride 105 98 - 111 mmol/L   CO2 23 22 - 32 mmol/L   Glucose, Bld 109 (H) 70 - 99 mg/dL   BUN 11 6 - 20 mg/dL   Creatinine, Ser 0.60 0.44 - 1.00 mg/dL   Calcium 8.6 (L) 8.9 - 10.3 mg/dL   Total Protein 7.3 6.5 - 8.1 g/dL   Albumin 3.8 3.5 - 5.0 g/dL   AST 15 15 - 41 U/L   ALT 15 0 - 44 U/L   Alkaline Phosphatase 32 (L) 38 - 126 U/L   Total Bilirubin 0.9 0.3 - 1.2 mg/dL   GFR calc non Af Amer >60 >60 mL/min   GFR calc Af Amer >60 >60 mL/min   Anion gap 10 5 - 15      IMAGING US Pelvis Transvanginal Non-ob (tv Only)  Result Date: 07/21/2018 CLINICAL DATA:  Pelvic pain and heavy bleeding EXAM: TRANSVAGINAL ULTRASOUND OF PELVIS DOPPLER ULTRASOUND OF OVARIES TECHNIQUE: Transvaginal ultrasound examination of the pelvis was performed including evaluation of the uterus, ovaries, adnexal regions, and pelvic cul-de-sac. Color and duplex Doppler ultrasound was utilized to evaluate blood flow to the ovaries. COMPARISON:  06/24/2018, 05/06/2018 FINDINGS: Uterus Measurements: 8.7 x 7.1 x 7.0 cm = volume: 226 mL. No fibroids or other mass visualized. Endometrium Thickness: 22 mm. Thickened and heterogeneous endometrium is noted similar to that seen on prior exam. Right ovary Measurements: 2.8 x 2.0 x 2.7 cm = volume: 8 mL. Normal appearance/no adnexal mass. Left ovary Measurements: 2.5 x 2.4 x 1.6 cm = volume: 5 mL. 3.4 cm cystic area is noted with central septation. This may represent 2 adjacent cysts or a partially septated single cyst. Pulsed Doppler evaluation demonstrates normal low-resistance arterial and venous waveforms in both ovaries. IMPRESSION: No evidence of ovarian torsion. Thickened heterogeneous  endometrium similar to that seen on the prior exams. Endometrial sampling has been previously suggested. New cystic lesion within the left ovary which may represent 2 adjacent cysts or a larger partially septated cyst. Electronically Signed   By: Inez Catalina M.D.   On: 07/21/2018 00:57   [place] Sonohysterogram  Result Date: 06/24/2018 CLINICAL DATA:  37 year old female with dysfunctional uterine bleeding. Question focal endometrial lesions on recent pelvic sonogram. EXAM: [place] SONOHYSTEROGRAM TECHNIQUE: Following cleansing of the cervix and vagina with Betadine, a hysterosalpingogram catheter was placed within the endocervical canal. Sonohysterogram was then performed with transvaginal sonography during infusion of sterile saline solution into the endometrial cavity. COMPARISON:  05/06/2018 pelvic sonogram. FINDINGS: Sonohysterography demonstrates prominent very irregular soft tissue thickening of the entire endometrial cavity, without a discrete mass. Bilayer endometrial thickness 25 mm on today's scan. There is scattered vascularity throughout the regions of soft tissue thickening in the endometrial cavity on color Doppler, without a dominant feeding vessel. An asymmetric 2.5 x 1.9 x 2.3 cm irregular focus of soft tissue thickening in the right fundal endometrial cavity is contiguous with the irregular regions of soft tissue thickening throughout the uterine cavity, and is not considered a discrete endometrial mass. No submucosal or intracavitary fibroids. The procedure was well tolerated by the patient, with no immediate complication. IMPRESSION: Very irregular and prominent soft tissue thickening of the entire endometrial cavity  with associated scattered endometrial vascularity on color Doppler, without discrete endometrial mass. Bilayer endometrial thickness 25 mm. Imaging differential diagnosis includes endometrial hyperplasia or endometrial carcinoma. Consider endometrial tissue sampling. Electronically  Signed   By: Ilona Sorrel M.D.   On: 06/24/2018 10:54   US Pelvic Doppler (torsion R/o Or Mass Arterial Flow)  Result Date: 07/21/2018 CLINICAL DATA:  Pelvic pain and heavy bleeding EXAM: TRANSVAGINAL ULTRASOUND OF PELVIS DOPPLER ULTRASOUND OF OVARIES TECHNIQUE: Transvaginal ultrasound examination of the pelvis was performed including evaluation of the uterus, ovaries, adnexal regions, and pelvic cul-de-sac. Color and duplex Doppler ultrasound was utilized to evaluate blood flow to the ovaries. COMPARISON:  06/24/2018, 05/06/2018 FINDINGS: Uterus Measurements: 8.7 x 7.1 x 7.0 cm = volume: 226 mL. No fibroids or other mass visualized. Endometrium Thickness: 22 mm. Thickened and heterogeneous endometrium is noted similar to that seen on prior exam. Right ovary Measurements: 2.8 x 2.0 x 2.7 cm = volume: 8 mL. Normal appearance/no adnexal mass. Left ovary Measurements: 2.5 x 2.4 x 1.6 cm = volume: 5 mL. 3.4 cm cystic area is noted with central septation. This may represent 2 adjacent cysts or a partially septated single cyst. Pulsed Doppler evaluation demonstrates normal low-resistance arterial and venous waveforms in both ovaries. IMPRESSION: No evidence of ovarian torsion. Thickened heterogeneous endometrium similar to that seen on the prior exams. Endometrial sampling has been previously suggested. New cystic lesion within the left ovary which may represent 2 adjacent cysts or a larger partially septated cyst. Electronically Signed   By: Inez Catalina M.D.   On: 07/21/2018 00:57      MAU Management/MDM: Pt with heavy bleeding, soaked a large pad in less than 1 hour in MAU. Pt writhing in pain and moaning upon arrival. Toradol 60 mg IM given with little relief.  Unable to perform pelvic exam due to pain.  No rebound tenderness, pain is all suprapubic and adnexal in location.    Consult Dr Harolyn Rutherford with assessment and findings.  Orders entered for pain management, nausea, and bleeding.  Dr Harolyn Rutherford to evaluate pt  in MAU.       ASSESSMENT Principal Problem:   Abnormal uterine bleeding (AUB) Active Problems:   Hypertension   Thickened endometrium   History of endometrial hyperplasia   Morbid obesity with BMI of 50.0-59.9, adult [facility])    PLAN Dr Harolyn Rutherford to evaluate pt    Fatima Blank Certified Nurse-Midwife 07/20/2018  10:04 PM    Attestation of Attending Supervision of [place] Provider (PA/CNM/NP): Evaluation and management procedures were performed by the Advanced Practice Provider under my supervision and collaboration.  I have reviewed the Advanced Practice Provider's note and chart, and I agree with the management and plan.  CBC Latest Ref Rng & Units 07/21/2018 07/20/2018 06/24/2018  WBC 4.0 - 10.5 K/uL 7.6 8.0 10.7(H)  Hemoglobin 12.0 - 15.0 g/dL 8.4(L) 9.1(L) 8.8(L)  Hematocrit 36.0 - 46.0 % 29.8(L) 32.1(L) 30.1(L)  Platelets 150 - 400 K/uL 387 384 350    Patient with increased pain and bleeding, associated with nausea and vomiting. Toradol did not help with the pain, unable to get IV access. Anesthesia team called to help with this.  On external exam, no active hemorrhaging noted, but had moderate amount of active bleeding.  Unable to do internal exam due to patient's discomfort. Stable initial hemoglobin. - Admit for observation, bleeding control and pain management - Obtain and maintain IV access, NS bolus and infusion ordered - [place] IV analgesia (Dilaudid ordered) prn severe  pain - IV antiemetics ordered - Transvaginal ultrasound ordered to rule out adnexal pathology given N/V, no torsion seen  - Norethindrone 15 mg po ordered; to be given after antiemetics.  Will avoid estrogen given history of HTN and thickened endometrial stripe/history of endometrial hyperplasia.   - TXA 1g IV ordered - Type and crossmatch for 3 units - Repeat CBC ordered in the morning - Given ultrasound findings and prolonged duration of bleeding, will add on for Hysteroscopy, D&C in  the morning with Dr. Roselie Awkward. - Routine inpatient GYN care.    Verita Schneiders, MD, [place] for [employer], [place]

## 2018-07-20 NOTE — MAU Provider Note (Signed)
Chief Complaint: Vaginal Bleeding and Abdominal Pain   None     SUBJECTIVE HPI: Molly Archer is a 37 y.o. G0 with hx HTN, obesity BMI 35, AUB with thickened endometrium who presents to maternity admissions reporting pain since her sonohysterogram that worsened in the last 24 hours. She has heavy bleeding today, soaking a pad every hour all day. Usually her bleeding is less during the day but is heavy at night. She is taking Provera 10 mg daily and had 2 Depo Provera injections in October for bleeding.  She is on Norvasc for HTN but did not take her dose today. The pain is low in her abdomen, bilateral, intermittent in waves with the bleeding and associated with n/v.  There are no other symptoms. She has not tried any other treatments recently.     HPI  Past Medical History:  Diagnosis Date  . Anemia   . Asthma    only uses inhaler in fall and spring  . GERD (gastroesophageal reflux disease)    diet controlled, occ. uses omeprazole  . Headache    otc med prn  . History of kidney stones   . Hx of Bell's palsy June 2013  . Hypertension    currently no meds  . Menometrorrhagia   . Psoriasis   . Shortness of breath dyspnea    with exercise/exertion   Past Surgical History:  Procedure Laterality Date  . COMBINED HYSTEROSCOPY DIAGNOSTIC / D&C N/A 2011   Benign secretory endometrium  . CYSTOSCOPY WITH STENT PLACEMENT Left 10/19/2014   Procedure: CYSTOSCOPY WITH STENT PLACEMENT;  Surgeon: Ardis Hughs, MD;  Location: [facility];  Service: Urology;  Laterality: Left;  . DILATION AND CURETTAGE OF UTERUS  10/2003   Polyp, mild atypia, simple and complex hyperplasia,  . DILATION AND CURETTAGE OF UTERUS  11/2003   Simple and complex hyperplasia  . DILATION AND CURETTAGE OF UTERUS  2011  . DILATION AND CURETTAGE OF UTERUS N/A 12/14/2014   Procedure: DILATATION AND CURETTAGE;  Surgeon: Mora Bellman, MD;  Location: [facility];  Service: Gynecology;  Laterality: N/A;  . HOLMIUM LASER  APPLICATION Left 0/34/7425   Procedure: HOLMIUM LASER APPLICATION;  Surgeon: Ardis Hughs, MD;  Location: [facility];  Service: Urology;  Laterality: Left;  . INTRAUTERINE DEVICE (IUD) INSERTION N/A 12/14/2014   Procedure: INTRAUTERINE DEVICE (IUD) INSERTION;  Surgeon: Mora Bellman, MD;  Location: [facility];  Service: Gynecology;  Laterality: N/A;  . STONE EXTRACTION WITH BASKET Left 10/19/2014   Procedure: STONE EXTRACTION WITH BASKET;  Surgeon: Ardis Hughs, MD;  Location: [facility];  Service: Urology;  Laterality: Left;  . WISDOM TOOTH EXTRACTION     Social History   Socioeconomic History  . Marital status: Single    Spouse name: Not on file  . Number of children: Not on file  . Years of education: Not on file  . Highest education level: Not on file  Occupational History  . Not on file  Social Needs  . Financial resource strain: Not very hard  . Food insecurity:    Worry: Never true    Inability: Never true  . Transportation needs:    Medical: No    Non-medical: No  Tobacco Use  . Smoking status: Never Smoker  . Smokeless tobacco: Never Used  Substance and Sexual Activity  . Alcohol use: No  . Drug use: No  . Sexual activity: Not Currently    Birth control/protection: None  Lifestyle  . Physical activity:  Days per week: 0 days    Minutes per session: 0 min  . Stress: Not at all  Relationships  . Social connections:    Talks on phone: More than three times a week    Gets together: Once a week    Attends religious service: More than 4 times per year    Active member of club or organization: Yes    Attends meetings of clubs or organizations: More than 4 times per year    Relationship status: Never married  . Intimate partner violence:    Fear of current or ex partner: Not on file    Emotionally abused: Not on file    Physically abused: Not on file    Forced sexual activity: Not on file  Other Topics Concern  . Not on file  Social History Narrative   She is  from [place], [place], moved here 1998.    Lives at home by herself.    No current facility-administered medications on file prior to encounter.    Current Outpatient Medications on File Prior to Encounter  Medication Sig Dispense Refill  . medroxyPROGESTERone (PROVERA) 10 MG tablet Take 2 tablets (20 mg total) by mouth daily. 30 tablet 2  . oxyCODONE-acetaminophen (PERCOCET) 10-325 MG tablet Take 1 tablet by mouth every 4 (four) hours as needed for pain.    Marland Kitchen albuterol (PROVENTIL HFA;VENTOLIN HFA) 108 (90 Base) MCG/ACT inhaler Inhale 2 puffs into the lungs every 4 (four) hours as needed for wheezing or shortness of breath (cough, shortness of breath or wheezing.). 1 Inhaler 1  . amLODipine (NORVASC) 5 MG tablet Take 1 tablet (5 mg total) by mouth daily. 90 tablet 3  . ferrous sulfate 325 (65 FE) MG tablet Take 1 tablet (325 mg total) by mouth 2 (two) times daily with a meal. 60 tablet 3  . ketorolac (TORADOL) 10 MG tablet Take 1 tablet (10 mg total) by mouth every 6 (six) hours as needed. 20 tablet 0  . nystatin (MYCOSTATIN/NYSTOP) powder Apply topically 3 (three) times daily. (Patient not taking: Reported on 04/30/2018) 30 g 0  . tranexamic acid (LYSTEDA) 650 MG TABS tablet Take 2 tablets (1,300 mg total) by mouth 3 (three) times daily. Take during menses for a maximum of five days (Patient not taking: Reported on 06/17/2018) 30 tablet 2  . triamcinolone cream (KENALOG) 0.1 % Apply 1 application topically 2 (two) times daily. 30 g 0   Allergies  Allergen Reactions  . Codeine Itching  . Peanut-Containing Drug Products Itching  . Penicillins Hives    Has patient had a PCN reaction causing immediate rash, facial/tongue/throat swelling, SOB or lightheadedness with hypotension: No Has patient had a PCN reaction causing severe rash involving mucus membranes or skin necrosis: No Has patient had a PCN reaction that required hospitalization No Has patient had a PCN reaction occurring within the last 10  years: No If all of the above answers are "NO", then may proceed with Cephalosporin use.   . Shellfish Allergy Itching    ROS:  Review of Systems  Constitutional: Negative for chills, fatigue and fever.  Respiratory: Negative for shortness of breath.   Cardiovascular: Negative for chest pain.  Gastrointestinal: Positive for abdominal pain, nausea and vomiting.  Genitourinary: Positive for pelvic pain and vaginal bleeding. Negative for difficulty urinating, dysuria, flank pain, vaginal discharge and vaginal pain.  Musculoskeletal: Positive for back pain.  Neurological: Negative for dizziness and headaches.  Psychiatric/Behavioral: Negative.      I have  reviewed patient's Past Medical Hx, Surgical Hx, Family Hx, Social Hx, medications and allergies.   Physical Exam  No data found. Constitutional: Well-developed, well-nourished female in no acute distress.  Cardiovascular: normal rate Respiratory: normal effort GI: Abd soft, non-tender. Pos BS x 4 MS: Extremities nontender, no edema, normal ROM Neurologic: Alert and oriented x 4.  GU: Neg CVAT.  PELVIC EXAM: Attempted pelvic exam but pt did not tolerate due to pain.  Will give additional medications and consider exam again.   LAB RESULTS Results for orders placed or performed during the hospital encounter of 07/20/18 (from the past 24 hour(s))  CBC     Status: Abnormal   Collection Time: 07/20/18  9:09 PM  Result Value Ref Range   WBC 8.0 4.0 - 10.5 K/uL   RBC 4.45 3.87 - 5.11 MIL/uL   Hemoglobin 9.1 (L) 12.0 - 15.0 g/dL   HCT 32.1 (L) 36.0 - 46.0 %   MCV 72.1 (L) 80.0 - 100.0 fL   MCH 20.4 (L) 26.0 - 34.0 pg   MCHC 28.3 (L) 30.0 - 36.0 g/dL   RDW 15.3 11.5 - 15.5 %   Platelets 384 150 - 400 K/uL   nRBC 0.0 0.0 - 0.2 %       IMAGING [place] Sonohysterogram  Result Date: 06/24/2018 CLINICAL DATA:  37 year old female with dysfunctional uterine bleeding. Question focal endometrial lesions on recent pelvic sonogram. EXAM:  [place] SONOHYSTEROGRAM TECHNIQUE: Following cleansing of the cervix and vagina with Betadine, a hysterosalpingogram catheter was placed within the endocervical canal. Sonohysterogram was then performed with transvaginal sonography during infusion of sterile saline solution into the endometrial cavity. COMPARISON:  05/06/2018 pelvic sonogram. FINDINGS: Sonohysterography demonstrates prominent very irregular soft tissue thickening of the entire endometrial cavity, without a discrete mass. Bilayer endometrial thickness 25 mm on today's scan. There is scattered vascularity throughout the regions of soft tissue thickening in the endometrial cavity on color Doppler, without a dominant feeding vessel. An asymmetric 2.5 x 1.9 x 2.3 cm irregular focus of soft tissue thickening in the right fundal endometrial cavity is contiguous with the irregular regions of soft tissue thickening throughout the uterine cavity, and is not considered a discrete endometrial mass. No submucosal or intracavitary fibroids. The procedure was well tolerated by the patient, with no immediate complication. IMPRESSION: Very irregular and prominent soft tissue thickening of the entire endometrial cavity with associated scattered endometrial vascularity on color Doppler, without discrete endometrial mass. Bilayer endometrial thickness 25 mm. Imaging differential diagnosis includes endometrial hyperplasia or endometrial carcinoma. Consider endometrial tissue sampling. Electronically Signed   By: Ilona Sorrel M.D.   On: 06/24/2018 10:54    MAU Management/MDM: Pt with heavy bleeding, soaked pads in less than 1 hour in MAU. Pt writhing in pain and moaning upon arrival. Toradol 60 mg IM given with little relief.  Unable to perform pelvic exam due to pain.  No rebound tenderness, pain is all suprapubic and adnexal in location.    Consult Dr Harolyn Rutherford with assessment and findings.  Orders entered for pain management, nausea, and bleeding.  Dr Harolyn Rutherford to evaluate  pt in MAU.      ASSESSMENT 1. Abnormal uterine bleeding (AUB)   2. Menometrorrhagia   3. Class 2 severe obesity with serious comorbidity and body mass index (BMI) of 35.0 to 35.9 in adult, unspecified obesity type ([place])   4. Essential hypertension     PLAN Dr Harolyn Rutherford to evaluate pt   Fatima Blank Certified Nurse-Midwife 07/20/2018  10:04 PM

## 2018-07-20 NOTE — Progress Notes (Signed)
Pt states she is in a lot of pain, toradol not helping and she could not keep down the zofran. Midwife notified.

## 2018-07-21 ENCOUNTER — Encounter (HOSPITAL_COMMUNITY): Payer: Self-pay

## 2018-07-21 ENCOUNTER — Observation Stay (HOSPITAL_COMMUNITY): Payer: BLUE CROSS/BLUE SHIELD | Admitting: Anesthesiology

## 2018-07-21 ENCOUNTER — Encounter (HOSPITAL_COMMUNITY)
Admission: AD | Disposition: A | Discharge status: Home / Self Care | Payer: Self-pay | Source: Ambulatory Visit | Attending: Obstetrics & Gynecology

## 2018-07-21 DIAGNOSIS — N939 Abnormal uterine and vaginal bleeding, unspecified: Secondary | ICD-10-CM | POA: Diagnosis not present

## 2018-07-21 HISTORY — PX: HYSTEROSCOPY WITH D & C: SHX1775

## 2018-07-21 LAB — BPAM RBC
Blood Product Expiration Date: 201912302359
Blood Product Expiration Date: 201912302359
Blood Product Expiration Date: 202001042359
Blood Product Expiration Date: 202001162359
Blood Product Expiration Date: 202001162359
Blood Product Expiration Date: 202001162359
Unit Type and Rh: 5100
Unit Type and Rh: 5100
Unit Type and Rh: 5100
Unit Type and Rh: 5100
Unit Type and Rh: 9500
Unit Type and Rh: 9500

## 2018-07-21 LAB — TYPE AND SCREEN
ABO/RH(D): O POS
ANTIBODY SCREEN: NEGATIVE
UNIT DIVISION: 0
UNIT DIVISION: 0
UNIT DIVISION: 0
Unit division: 0
Unit division: 0
Unit division: 0

## 2018-07-21 LAB — CBC WITH DIFFERENTIAL/PLATELET
Basophils Absolute: 0 10*3/uL (ref 0.0–0.1)
Basophils Relative: 0 %
Eosinophils Absolute: 0 10*3/uL (ref 0.0–0.5)
Eosinophils Relative: 0 %
HCT: 29.8 % — ABNORMAL LOW (ref 36.0–46.0)
HEMOGLOBIN: 8.4 g/dL — AB (ref 12.0–15.0)
Lymphocytes Relative: 16 %
Lymphs Abs: 1.2 10*3/uL (ref 0.7–4.0)
MCH: 20.5 pg — ABNORMAL LOW (ref 26.0–34.0)
MCHC: 28.2 g/dL — ABNORMAL LOW (ref 30.0–36.0)
MCV: 72.7 fL — ABNORMAL LOW (ref 80.0–100.0)
Monocytes Absolute: 0.4 10*3/uL (ref 0.1–1.0)
Monocytes Relative: 5 %
Neutro Abs: 5.9 10*3/uL (ref 1.7–7.7)
Neutrophils Relative %: 79 %
Platelets: 387 10*3/uL (ref 150–400)
RBC: 4.1 MIL/uL (ref 3.87–5.11)
RDW: 15.5 % (ref 11.5–15.5)
WBC: 7.6 10*3/uL (ref 4.0–10.5)
nRBC: 0 % (ref 0.0–0.2)

## 2018-07-21 LAB — URINALYSIS, ROUTINE W REFLEX MICROSCOPIC
Bacteria, UA: NONE SEEN
Bilirubin Urine: NEGATIVE
Glucose, UA: NEGATIVE mg/dL
Ketones, ur: NEGATIVE mg/dL
Nitrite: NEGATIVE
Protein, ur: 100 mg/dL — AB
RBC / HPF: >50 RBC/hpf — ABNORMAL HIGH (ref 0–5)
Specific Gravity, Urine: 1.025 (ref 1.005–1.030)
pH: 6 (ref 5.0–8.0)

## 2018-07-21 LAB — COMPREHENSIVE METABOLIC PANEL
ALK PHOS: 32 U/L — AB (ref 38–126)
ALT: 15 U/L (ref 0–44)
ANION GAP: 10 (ref 5–15)
AST: 15 U/L (ref 15–41)
Albumin: 3.8 g/dL (ref 3.5–5.0)
BUN: 11 mg/dL (ref 6–20)
CALCIUM: 8.6 mg/dL — AB (ref 8.9–10.3)
CO2: 23 mmol/L (ref 22–32)
Chloride: 105 mmol/L (ref 98–111)
Creatinine, Ser: 0.6 mg/dL (ref 0.44–1.00)
GFR calc Af Amer: >60 mL/min (ref >60)
GFR calc non Af Amer: >60 mL/min (ref >60)
Glucose, Bld: 109 mg/dL — ABNORMAL HIGH (ref 70–99)
Potassium: 3.8 mmol/L (ref 3.5–5.1)
Sodium: 138 mmol/L (ref 135–145)
Total Bilirubin: 0.9 mg/dL (ref 0.3–1.2)
Total Protein: 7.3 g/dL (ref 6.5–8.1)

## 2018-07-21 LAB — TSH: TSH: 0.615 u[IU]/mL (ref 0.350–4.500)

## 2018-07-21 LAB — ABO/RH: ABO/RH(D): O POS

## 2018-07-21 LAB — HCG, QUANTITATIVE, PREGNANCY: hCG, Beta Chain, Quant, S: <1 m[IU]/mL (ref <5)

## 2018-07-21 LAB — PREPARE RBC (CROSSMATCH)

## 2018-07-21 SURGERY — DILATATION AND CURETTAGE /HYSTEROSCOPY
Anesthesia: General

## 2018-07-21 MED ORDER — HYDROMORPHONE HCL 1 MG/ML IJ SOLN
INTRAMUSCULAR | Status: AC
  Administered 2018-07-21: 0.25 mg via INTRAVENOUS
  Filled 2018-07-21: qty 0.5

## 2018-07-21 MED ORDER — DOCUSATE SODIUM 100 MG PO CAPS
100.0000 mg | ORAL_CAPSULE | Freq: Two times a day (BID) | ORAL | Status: DC
  Filled 2018-07-21: qty 1

## 2018-07-21 MED ORDER — PROPOFOL 10 MG/ML IV BOLUS
INTRAVENOUS | Status: DC | PRN
  Administered 2018-07-21: 200 mg via INTRAVENOUS

## 2018-07-21 MED ORDER — FENTANYL CITRATE (PF) 100 MCG/2ML IJ SOLN
INTRAMUSCULAR | Status: DC | PRN
  Administered 2018-07-21 (×2): 50 ug via INTRAVENOUS

## 2018-07-21 MED ORDER — OXYCODONE-ACETAMINOPHEN 5-325 MG PO TABS: 1.0000 | ORAL_TABLET | ORAL | Status: DC | PRN

## 2018-07-21 MED ORDER — PROMETHAZINE HCL 25 MG/ML IJ SOLN: 6.2500 mg | INTRAMUSCULAR | Status: DC | PRN

## 2018-07-21 MED ORDER — DEXAMETHASONE SODIUM PHOSPHATE 10 MG/ML IJ SOLN
INTRAMUSCULAR | Status: AC
  Filled 2018-07-21: qty 1

## 2018-07-21 MED ORDER — BISACODYL 5 MG PO TBEC
5.0000 mg | DELAYED_RELEASE_TABLET | Freq: Every day | ORAL | Status: DC | PRN
  Filled 2018-07-21: qty 1

## 2018-07-21 MED ORDER — BUPIVACAINE HCL (PF) 0.5 % IJ SOLN
INTRAMUSCULAR | Status: DC | PRN
  Administered 2018-07-21: 3 mL

## 2018-07-21 MED ORDER — LIDOCAINE HCL (CARDIAC) PF 100 MG/5ML IV SOSY
PREFILLED_SYRINGE | INTRAVENOUS | Status: AC
  Filled 2018-07-21: qty 5

## 2018-07-21 MED ORDER — MIDAZOLAM HCL 2 MG/2ML IJ SOLN
INTRAMUSCULAR | Status: AC
  Filled 2018-07-21: qty 2

## 2018-07-21 MED ORDER — BUPIVACAINE-EPINEPHRINE (PF) 0.5% -1:200000 IJ SOLN
INTRAMUSCULAR | Status: AC
  Filled 2018-07-21: qty 30

## 2018-07-21 MED ORDER — MAGNESIUM HYDROXIDE 400 MG/5ML PO SUSP
30.0000 mL | Freq: Every day | ORAL | Status: DC | PRN
  Filled 2018-07-21: qty 30

## 2018-07-21 MED ORDER — ALUM & MAG HYDROXIDE-SIMETH 200-200-20 MG/5ML PO SUSP
30.0000 mL | ORAL | Status: DC | PRN
  Filled 2018-07-21: qty 30

## 2018-07-21 MED ORDER — ONDANSETRON HCL 4 MG/2ML IJ SOLN
4.0000 mg | Freq: Four times a day (QID) | INTRAMUSCULAR | Status: DC | PRN
  Administered 2018-07-21: 4 mg via INTRAVENOUS

## 2018-07-21 MED ORDER — MIDAZOLAM HCL 5 MG/5ML IJ SOLN
INTRAMUSCULAR | Status: DC | PRN
  Administered 2018-07-21: 2 mg via INTRAVENOUS

## 2018-07-21 MED ORDER — ONDANSETRON HCL 4 MG/2ML IJ SOLN: 4.0000 mg | Freq: Four times a day (QID) | INTRAMUSCULAR | Status: DC | PRN

## 2018-07-21 MED ORDER — HYDROMORPHONE HCL 1 MG/ML IJ SOLN
0.2500 mg | INTRAMUSCULAR | Status: DC | PRN
  Administered 2018-07-21 (×2): 0.25 mg via INTRAVENOUS

## 2018-07-21 MED ORDER — SODIUM CHLORIDE 0.9 % IV SOLN
INTRAVENOUS | Status: DC
  Administered 2018-07-21 (×2): via INTRAVENOUS

## 2018-07-21 MED ORDER — OXYCODONE-ACETAMINOPHEN 5-325 MG PO TABS: 2.0000 | ORAL_TABLET | Freq: Four times a day (QID) | ORAL | Status: DC | PRN

## 2018-07-21 MED ORDER — OXYCODONE-ACETAMINOPHEN 5-325 MG PO TABS: 1.0000 | ORAL_TABLET | Freq: Four times a day (QID) | ORAL | 0 refills | Status: DC | PRN

## 2018-07-21 MED ORDER — OXYCODONE HCL 5 MG PO TABS: 5.0000 mg | ORAL_TABLET | Freq: Once | ORAL | Status: DC | PRN

## 2018-07-21 MED ORDER — ONDANSETRON HCL 4 MG PO TABS: 4.0000 mg | ORAL_TABLET | Freq: Four times a day (QID) | ORAL | Status: DC | PRN

## 2018-07-21 MED ORDER — PROPOFOL 10 MG/ML IV BOLUS
INTRAVENOUS | Status: AC
  Filled 2018-07-21: qty 20

## 2018-07-21 MED ORDER — TRANEXAMIC ACID 650 MG PO TABS
1300.0000 mg | ORAL_TABLET | Freq: Three times a day (TID) | ORAL | Status: DC
  Filled 2018-07-21 (×5): qty 2

## 2018-07-21 MED ORDER — NORETHINDRONE ACETATE 5 MG PO TABS
10.0000 mg | ORAL_TABLET | Freq: Every day | ORAL | Status: DC
  Filled 2018-07-21 (×2): qty 2

## 2018-07-21 MED ORDER — NORETHINDRONE ACETATE 5 MG PO TABS: 10.0000 mg | ORAL_TABLET | Freq: Every day | ORAL | 1 refills | Status: DC

## 2018-07-21 MED ORDER — MAGNESIUM CITRATE PO SOLN: 1.0000 | Freq: Once | ORAL | Status: DC | PRN

## 2018-07-21 MED ORDER — ZOLPIDEM TARTRATE 5 MG PO TABS: 5.0000 mg | ORAL_TABLET | Freq: Every evening | ORAL | Status: DC | PRN

## 2018-07-21 MED ORDER — MEPERIDINE HCL 25 MG/ML IJ SOLN: 6.2500 mg | INTRAMUSCULAR | Status: DC | PRN

## 2018-07-21 MED ORDER — OXYCODONE HCL 5 MG/5ML PO SOLN: 5.0000 mg | Freq: Once | ORAL | Status: DC | PRN

## 2018-07-21 MED ORDER — ALBUTEROL SULFATE (2.5 MG/3ML) 0.083% IN NEBU: 3.0000 mL | INHALATION_SOLUTION | RESPIRATORY_TRACT | Status: DC | PRN

## 2018-07-21 MED ORDER — HYDRALAZINE HCL 20 MG/ML IJ SOLN: 10.0000 mg | INTRAMUSCULAR | Status: DC | PRN

## 2018-07-21 MED ORDER — DEXAMETHASONE SODIUM PHOSPHATE 10 MG/ML IJ SOLN
INTRAMUSCULAR | Status: DC | PRN
  Administered 2018-07-21: 10 mg via INTRAVENOUS

## 2018-07-21 MED ORDER — FENTANYL CITRATE (PF) 100 MCG/2ML IJ SOLN
INTRAMUSCULAR | Status: AC
  Filled 2018-07-21: qty 2

## 2018-07-21 MED ORDER — SODIUM CHLORIDE 0.9 % IR SOLN
Status: DC | PRN
  Administered 2018-07-21: 3000 mL

## 2018-07-21 MED ORDER — LIDOCAINE HCL (CARDIAC) PF 100 MG/5ML IV SOSY
PREFILLED_SYRINGE | INTRAVENOUS | Status: DC | PRN
  Administered 2018-07-21: 50 mg via INTRAVENOUS

## 2018-07-21 MED ORDER — AMLODIPINE BESYLATE 10 MG PO TABS
10.0000 mg | ORAL_TABLET | Freq: Every day | ORAL | Status: DC
  Administered 2018-07-21: 10 mg via ORAL
  Filled 2018-07-21 (×2): qty 1

## 2018-07-21 MED ORDER — ONDANSETRON HCL 4 MG/2ML IJ SOLN
INTRAMUSCULAR | Status: AC
  Filled 2018-07-21: qty 2

## 2018-07-21 MED ORDER — IBUPROFEN 600 MG PO TABS
600.0000 mg | ORAL_TABLET | Freq: Four times a day (QID) | ORAL | Status: DC | PRN
  Administered 2018-07-21: 600 mg via ORAL
  Filled 2018-07-21: qty 1

## 2018-07-21 MED ORDER — FERROUS SULFATE 325 (65 FE) MG PO TABS
325.0000 mg | ORAL_TABLET | Freq: Two times a day (BID) | ORAL | Status: DC
  Filled 2018-07-21: qty 1

## 2018-07-21 SURGICAL SUPPLY — 12 items
CATH ROBINSON RED A/P 16FR (CATHETERS) ×2 IMPLANT
ELECT REM PT RETURN 9FT ADLT (ELECTROSURGICAL) ×2
ELECTRODE REM PT RTRN 9FT ADLT (ELECTROSURGICAL) ×1 IMPLANT
GLOVE BIO SURGEON STRL SZ 6.5 (GLOVE) ×2 IMPLANT
GLOVE BIOGEL PI IND STRL 7.0 (GLOVE) ×2 IMPLANT
GLOVE BIOGEL PI INDICATOR 7.0 (GLOVE) ×2
GOWN STRL REUS W/TWL LRG LVL3 (GOWN DISPOSABLE) ×4 IMPLANT
HIBICLENS CHG 4% 4OZ BTL (MISCELLANEOUS) ×2 IMPLANT
KIT PROCEDURE FLUENT (KITS) ×2 IMPLANT
PACK VAGINAL MINOR WOMEN LF (CUSTOM PROCEDURE TRAY) ×2 IMPLANT
PAD OB MATERNITY 4.3X12.25 (PERSONAL CARE ITEMS) ×2 IMPLANT
TOWEL OR 17X24 6PK STRL BLUE (TOWEL DISPOSABLE) ×4 IMPLANT

## 2018-07-21 NOTE — Progress Notes (Addendum)
[facility]ulty [facility] OB/GYN Attending Note  Subjective:  Bleeding is less since TXA, Aygestin adminstration, pain is less. No other acute preoperative concerns.  Admitted on 07/20/2018 for Abnormal uterine bleeding (AUB).    Objective:  Blood pressure (!) 124/57, pulse 83, temperature 99.1 F (37.3 C), temperature source Oral, resp. rate 20, height 5\' 5"  (1.651 m), weight (!) 144.2 kg, SpO2 99 %. Gen: NAD HENT: Normocephalic, atraumatic Lungs: Normal respiratory effort Heart: Regular rate noted Abdomen: NT, soft Cervix: Deferred Ext: 2+ DTRs, no edema, no cyanosis, negative Homan's sign  Labs: CBC Latest Ref Rng & Units 07/21/2018 07/20/2018 06/24/2018  WBC 4.0 - 10.5 K/uL 7.6 8.0 10.7(H)  Hemoglobin 12.0 - 15.0 g/dL 8.4(L) 9.1(L) 8.8(L)  Hematocrit 36.0 - 46.0 % 29.8(L) 32.1(L) 30.1(L)  Platelets 150 - 400 K/uL 387 384 350   CMP Latest Ref Rng & Units 07/21/2018 07/20/2018 06/24/2018  Glucose 70 - 99 mg/dL 109(H) 141(H) 84  BUN 6 - 20 mg/dL 11 12 11   Creatinine 0.44 - 1.00 mg/dL 0.60 0.64 0.62  Sodium 135 - 145 mmol/L 138 139 139  Potassium 3.5 - 5.1 mmol/L 3.8 4.3 3.9  Chloride 98 - 111 mmol/L 105 106 108  CO2 22 - 32 mmol/L 23 24 23   Calcium 8.9 - 10.3 mg/dL 8.6(L) 9.1 8.6(L)  Total Protein 6.5 - 8.1 g/dL 7.3 7.6 7.5  Total Bilirubin 0.3 - 1.2 mg/dL 0.9 0.8 0.7  Alkaline Phos 38 - 126 U/L 32(L) 37(L) 40  AST 15 - 41 U/L 15 20 11(L)  ALT 0 - 44 U/L 15 18 14     Scheduled Meds: . amLODipine  10 mg Oral Daily  . docusate sodium  100 mg Oral BID  . ferrous sulfate  325 mg Oral BID WC  . norethindrone  10 mg Oral Daily  . tranexamic acid  1,300 mg Oral Q8H   Continuous Infusions: . sodium chloride 10 mL/hr (07/20/18 2339)  . sodium chloride 125 mL/hr at 07/21/18 0610   PRN Meds:.albuterol, alum & mag hydroxide-simeth, bisacodyl, hydrALAZINE, HYDROmorphone (DILAUDID) injection, ibuprofen, magnesium citrate, magnesium hydroxide, ondansetron **OR** ondansetron (ZOFRAN) IV,  oxyCODONE-acetaminophen, promethazine, zolpidem  Assessment & Plan:  37 y.o. G0 admitted for AUB. Stabilized bleeding, stable hemoglobin.  Given history of endometrial hyperplasia, her current thickened endometrium, prolonged bleeding, morbid obesity, she will undergo Hysteroscopy, D&C today. Risks of surgery reviewed with patient, will be re-reviewed with her surgeon (Dr. Roselie Awkward) prior to surgery. Continue NPO, has been so prior to midnight.  Anesthesia team aware of patient, discussed with Dr. [name] Heck. Continue close observation for now. Consider continuing Aygestin 10 mg daily and TXA (5 day course) after surgery. Patient is aware that she is likely to be discharged to home after the procedure today.   Verita Schneiders, MD, [place] [employer] [facility] [employer], [place]

## 2018-07-21 NOTE — Anesthesia Preprocedure Evaluation (Signed)
Anesthesia Evaluation  Patient identified by MRN, date of birth, ID band Patient awake    Reviewed: Allergy & Precautions, NPO status , Patient's Chart, lab work & pertinent test results  Airway Mallampati: II  TM Distance: >3 FB Neck ROM: Full    Dental no notable dental hx.    Pulmonary asthma ,    Pulmonary exam normal breath sounds clear to auscultation       Cardiovascular hypertension, Pt. on medications Normal cardiovascular exam Rhythm:Regular Rate:Normal     Neuro/Psych  Headaches, negative psych ROS   GI/Hepatic Neg liver ROS, GERD  ,  Endo/Other  Morbid obesity  Renal/GU negative Renal ROS  negative genitourinary   Musculoskeletal negative musculoskeletal ROS (+)   Abdominal (+) + obese,   Peds negative pediatric ROS (+)  Hematology negative hematology ROS (+) anemia ,   Anesthesia Other Findings   Reproductive/Obstetrics negative OB ROS                             Anesthesia Physical  Anesthesia Plan  ASA: III  Anesthesia Plan: General   Post-op Pain Management:    Induction: Intravenous  PONV Risk Score and Plan: 3 and Ondansetron, Dexamethasone and Midazolam  Airway Management Planned: LMA  Additional Equipment:   Intra-op Plan:   Post-operative Plan: Extubation in OR  Informed Consent: I have reviewed the patients History and Physical, chart, labs and discussed the procedure including the risks, benefits and alternatives for the proposed anesthesia with the patient or authorized representative who has indicated his/her understanding and acceptance.   Dental advisory given  Plan Discussed with: CRNA and Surgeon  Anesthesia Plan Comments:         Anesthesia Quick Evaluation

## 2018-07-21 NOTE — Discharge Summary (Signed)
Physician Discharge Summary  Patient ID: Molly Archer MRN: [id number] DOB/AGE: 09/24/1980 37 y.o.  Admit date: 07/20/2018 Discharge date: 07/21/2018  Admission Diagnoses:abnormal uterine bleeding  Discharge Diagnoses:  Principal Problem:   Abnormal uterine bleeding (AUB) Active Problems:   Hypertension   Thickened endometrium   History of endometrial hyperplasia   Morbid obesity with BMI of 50.0-59.9, adult [facility]   Discharged Condition: good  Hospital Course: History of Present Illness: Molly T Goodwynis a 37 y.o.G0 with hx HTN, obesity BMI 35, AUB with thickened endometriumwho presents to maternity admissions reporting prolonged heavy bleeding and pain since her sonohysterogram on 06/24/2018 that worsened in the last 24 hours. She has heavy bleeding today, soaking a pad every hour all day. Usually her bleeding is less during the day but is heavy at night. She is taking Provera 10 mg daily and had 2 Depo Provera injections in October for bleeding. She is on Norvasc for HTN but did not take her dose today. The pain is low in her abdomen, bilateral, intermittent in waves with the bleeding and associated with n/v. There are no other symptoms. She has not tried any other treatments recently. Of note, patient has history of multiple Hysteroscopy with D&Cs in the past, had been diagnosed with simple and complex endometrial hyperplasia with atypia and has been on multiple progestin regimens/modalities over the years.      Past Medical History:  Diagnosis Date  . Anemia   . Asthma    only uses inhaler in fall and spring  . GERD (gastroesophageal reflux disease)    diet controlled, occ. uses omeprazole  . Headache    otc med prn  . History of kidney stones   . Hx of Bell's palsy June 2013  . Hypertension    currently no meds  . Menometrorrhagia   . Psoriasis   . Shortness of breath dyspnea    with exercise/exertion        Past Surgical History:   Procedure Laterality Date  . COMBINED HYSTEROSCOPY DIAGNOSTIC / D&C N/A 2011   Benign secretory endometrium  . CYSTOSCOPY WITH STENT PLACEMENT Left 10/19/2014   Procedure: CYSTOSCOPY WITH STENT PLACEMENT;  Surgeon: Ardis Hughs, MD;  Location: [facility];  Service: Urology;  Laterality: Left;  . DILATION AND CURETTAGE OF UTERUS  10/2003   Polyp, mild atypia, simple and complex hyperplasia,  . DILATION AND CURETTAGE OF UTERUS  11/2003   Simple and complex hyperplasia  . DILATION AND CURETTAGE OF UTERUS  2011  . DILATION AND CURETTAGE OF UTERUS N/A 12/14/2014   Procedure: DILATATION AND CURETTAGE;  Surgeon: Mora Bellman, MD;  Location: [place];  Service: Gynecology;  Laterality: N/A;  . HOLMIUM LASER APPLICATION Left 6/57/8469   Procedure: HOLMIUM LASER APPLICATION;  Surgeon: Ardis Hughs, MD;  Location: [facility];  Service: Urology;  Laterality: Left;  . INTRAUTERINE DEVICE (IUD) INSERTION N/A 12/14/2014   Procedure: INTRAUTERINE DEVICE (IUD) INSERTION;  Surgeon: Mora Bellman, MD;  Location: [facility];  Service: Gynecology;  Laterality: N/A;  . STONE EXTRACTION WITH BASKET Left 10/19/2014   Procedure: STONE EXTRACTION WITH BASKET;  Surgeon: Ardis Hughs, MD;  Location: [facility];  Service: Urology;  Laterality: Left;  . WISDOM TOOTH EXTRACTION      Social History        Socioeconomic History  . Marital status: Single    Spouse name: Not on file  . Number of children: Not on file  . Years of education: Not on file  .  Highest education level: Not on file  Occupational History  . Not on file  Social Needs  . Financial resource strain: Not very hard  . Food insecurity:    Worry: Never true    Inability: Never true  . Transportation needs:    Medical: No    Non-medical: No  Tobacco Use  . Smoking status: Never Smoker  . Smokeless tobacco: Never Used  Substance and Sexual Activity  . Alcohol use: No  . Drug use: No  . Sexual activity: Not  Currently    Birth control/protection: None  Lifestyle  . Physical activity:    Days per week: 0 days    Minutes per session: 0 min  . Stress: Not at all  Relationships  . Social connections:    Talks on phone: More than three times a week    Gets together: Once a week    Attends religious service: More than 4 times per year    Active member of club or organization: Yes    Attends meetings of clubs or organizations: More than 4 times per year    Relationship status: Never married  . Intimate partner violence:    Fear of current or ex partner: Not on file    Emotionally abused: Not on file    Physically abused: Not on file    Forced sexual activity: Not on file  Other Topics Concern  . Not on file  Social History Narrative   She is from [place], [place], moved here 1998.    Lives at home by herself.   No current facility-administered medications on file prior to encounter.          Current Outpatient Medications on File Prior to Encounter  Medication Sig Dispense Refill  . medroxyPROGESTERone (PROVERA) 10 MG tablet Take 2 tablets (20 mg total) by mouth daily. 30 tablet 2  . oxyCODONE-acetaminophen (PERCOCET) 10-325 MG tablet Take 1 tablet by mouth every 4 (four) hours as needed for pain.    Marland Kitchen albuterol (PROVENTIL HFA;VENTOLIN HFA) 108 (90 Base) MCG/ACT inhaler Inhale 2 puffs into the lungs every 4 (four) hours as needed for wheezing or shortness of breath (cough, shortness of breath or wheezing.). 1 Inhaler 1  . amLODipine (NORVASC) 5 MG tablet Take 1 tablet (5 mg total) by mouth daily. 90 tablet 3  . ferrous sulfate 325 (65 FE) MG tablet Take 1 tablet (325 mg total) by mouth 2 (two) times daily with a meal. 60 tablet 3  . ketorolac (TORADOL) 10 MG tablet Take 1 tablet (10 mg total) by mouth every 6 (six) hours as needed. 20 tablet 0  . nystatin (MYCOSTATIN/NYSTOP) powder Apply topically 3 (three) times daily. (Patient not taking: Reported on  04/30/2018) 30 g 0  . tranexamic acid (LYSTEDA) 650 MG TABS tablet Take 2 tablets (1,300 mg total) by mouth 3 (three) times daily. Take during menses for a maximum of five days (Patient not taking: Reported on 06/17/2018) 30 tablet 2  . triamcinolone cream (KENALOG) 0.1 % Apply 1 application topically 2 (two) times daily. 30 g 0        Allergies  Allergen Reactions  . Codeine Itching  . Peanut-Containing Drug Products Itching  . Penicillins Hives    Has patient had a PCN reaction causing immediate rash, facial/tongue/throat swelling, SOB or lightheadedness with hypotension: No Has patient had a PCN reaction causing severe rash involving mucus membranes or skin necrosis: No Has patient had a PCN reaction that required hospitalization No  Has patient had a PCN reaction occurring within the last 10 years: No If all of the above answers are "NO", then may proceed with Cephalosporin use.   . Shellfish Allergy Itching    ROS: Review of Systems  Constitutional: Negative forchills,fatigueand fever.  Respiratory: Negative forshortness of breath.  Cardiovascular: Negative forchest pain.  Gastrointestinal: Positive forabdominal pain,nauseaand vomiting.  Genitourinary: Positive forpelvic painand vaginal bleeding. Negative fordifficulty urinating,dysuria,flank pain,vaginal dischargeand vaginal pain.  Musculoskeletal: Positive forback pain.  Neurological: Negative fordizzinessand headaches.  Psychiatric/Behavioral:Negative.   I have reviewed patient's Past Medical Hx, Surgical Hx, Family Hx, Social Hx, medications and allergies.   Physical Exam   Patient Vitals for the past 24 hrs:  BP Temp Temp src Pulse Resp SpO2 Height Weight  07/20/18 2354 130/69 - - 73 - - - -  07/20/18 2351 - 98.1 F (36.7 C) Oral - - - - -  07/20/18 2025 (!) 184/93 - - 82 - 98 % - -   Constitutional: Well-developed, well-nourished female in no acute distress.  Cardiovascular:  normal rate Respiratory: normal effort GI: Abd soft, non-tender. Pos BS x 4 MS: Extremities nontender, no edema, normal ROM Neurologic: Alert and oriented x 4.  GU: Neg CVAT.  PELVIC EXAM:Attempted pelvic exam but pt did not tolerate due to pain. Will give additional medications and consider exam again.   LAB RESULTS  LabResultsLast24Hours  Results for orders placed or performed during the hospital encounter of 07/20/18 (from the past 24 hour(s))  CBC     Status: Abnormal   Collection Time: 07/20/18  9:09 PM  Result Value Ref Range   WBC 8.0 4.0 - 10.5 K/uL   RBC 4.45 3.87 - 5.11 MIL/uL   Hemoglobin 9.1 (L) 12.0 - 15.0 g/dL   HCT 32.1 (L) 36.0 - 46.0 %   MCV 72.1 (L) 80.0 - 100.0 fL   MCH 20.4 (L) 26.0 - 34.0 pg   MCHC 28.3 (L) 30.0 - 36.0 g/dL   RDW 15.3 11.5 - 15.5 %   Platelets 384 150 - 400 K/uL   nRBC 0.0 0.0 - 0.2 %  Prepare RBC     Status: None   Collection Time: 07/20/18  9:59 PM  Result Value Ref Range   Order Confirmation      ORDER PROCESSED BY BLOOD BANK Performed at [facility], [address], [place], [place] [address]   Type and screen     Status: None (Preliminary result)   Collection Time: 07/20/18 11:07 PM  Result Value Ref Range   ABO/RH(D) O POS    Antibody Screen NEG    Sample Expiration      07/23/2018 Performed at [facility], [address] [place], [place] [address]    Unit Number [id number]    Blood Component Type RED CELLS,LR    Unit division 00    Status of Unit ALLOCATED    Transfusion Status OK TO TRANSFUSE    Crossmatch Result Compatible    Unit Number [id number]    Blood Component Type RED CELLS,LR    Unit division 00    Status of Unit ALLOCATED    Transfusion Status OK TO TRANSFUSE    Crossmatch Result Compatible    Unit Number [id number]    Blood Component Type RED CELLS,LR    Unit division 00    Status of Unit ALLOCATED     Transfusion Status OK TO TRANSFUSE    Crossmatch Result Compatible   TSH     Status: None  Collection Time: 07/20/18 11:07 PM  Result Value Ref Range   TSH 0.615 0.350 - 4.500 uIU/mL  hCG, quantitative, pregnancy     Status: None   Collection Time: 07/20/18 11:07 PM  Result Value Ref Range   hCG, Beta Chain, Quant, S <1 <5 mIU/mL  Comprehensive metabolic panel     Status: Abnormal   Collection Time: 07/20/18 11:07 PM  Result Value Ref Range   Sodium 139 135 - 145 mmol/L   Potassium 4.3 3.5 - 5.1 mmol/L   Chloride 106 98 - 111 mmol/L   CO2 24 22 - 32 mmol/L   Glucose, Bld 141 (H) 70 - 99 mg/dL   BUN 12 6 - 20 mg/dL   Creatinine, Ser 0.64 0.44 - 1.00 mg/dL   Calcium 9.1 8.9 - 10.3 mg/dL   Total Protein 7.6 6.5 - 8.1 g/dL   Albumin 4.4 3.5 - 5.0 g/dL   AST 20 15 - 41 U/L   ALT 18 0 - 44 U/L   Alkaline Phosphatase 37 (L) 38 - 126 U/L   Total Bilirubin 0.8 0.3 - 1.2 mg/dL   GFR calc non Af Amer >60 >60 mL/min   GFR calc Af Amer >60 >60 mL/min   Anion gap 9 5 - 15  ABO/Rh     Status: None   Collection Time: 07/20/18 11:07 PM  Result Value Ref Range   ABO/RH(D)      O POS Performed at [facility], [address], [place], [place] [address]   Urinalysis, Routine w reflex microscopic     Status: Abnormal   Collection Time: 07/21/18  5:36 AM  Result Value Ref Range   Color, Urine YELLOW YELLOW   APPearance CLEAR CLEAR   Specific Gravity, Urine 1.025 1.005 - 1.030   pH 6.0 5.0 - 8.0   Glucose, UA NEGATIVE NEGATIVE mg/dL   Hgb urine dipstick LARGE (A) NEGATIVE   Bilirubin Urine NEGATIVE NEGATIVE   Ketones, ur NEGATIVE NEGATIVE mg/dL   Protein, ur 100 (A) NEGATIVE mg/dL   Nitrite NEGATIVE NEGATIVE   Leukocytes, UA TRACE (A) NEGATIVE   RBC / HPF >50 (H) 0 - 5 RBC/hpf   WBC, UA 11-20 0 - 5 WBC/hpf   Bacteria, UA NONE SEEN NONE SEEN   Squamous Epithelial / LPF 0-5 0 - 5   Mucus PRESENT    Non Squamous Epithelial 0-5 (A)  NONE SEEN  CBC WITH DIFFERENTIAL     Status: Abnormal   Collection Time: 07/21/18  5:55 AM  Result Value Ref Range   WBC 7.6 4.0 - 10.5 K/uL   RBC 4.10 3.87 - 5.11 MIL/uL   Hemoglobin 8.4 (L) 12.0 - 15.0 g/dL   HCT 29.8 (L) 36.0 - 46.0 %   MCV 72.7 (L) 80.0 - 100.0 fL   MCH 20.5 (L) 26.0 - 34.0 pg   MCHC 28.2 (L) 30.0 - 36.0 g/dL   RDW 15.5 11.5 - 15.5 %   Platelets 387 150 - 400 K/uL   nRBC 0.0 0.0 - 0.2 %   Neutrophils Relative % 79 %   Neutro Abs 5.9 1.7 - 7.7 K/uL   Lymphocytes Relative 16 %   Lymphs Abs 1.2 0.7 - 4.0 K/uL   Monocytes Relative 5 %   Monocytes Absolute 0.4 0.1 - 1.0 K/uL   Eosinophils Relative 0 %   Eosinophils Absolute 0.0 0.0 - 0.5 K/uL   Basophils Relative 0 %   Basophils Absolute 0.0 0.0 - 0.1 K/uL  Comprehensive metabolic  panel     Status: Abnormal   Collection Time: 07/21/18  5:55 AM  Result Value Ref Range   Sodium 138 135 - 145 mmol/L   Potassium 3.8 3.5 - 5.1 mmol/L   Chloride 105 98 - 111 mmol/L   CO2 23 22 - 32 mmol/L   Glucose, Bld 109 (H) 70 - 99 mg/dL   BUN 11 6 - 20 mg/dL   Creatinine, Ser 0.60 0.44 - 1.00 mg/dL   Calcium 8.6 (L) 8.9 - 10.3 mg/dL   Total Protein 7.3 6.5 - 8.1 g/dL   Albumin 3.8 3.5 - 5.0 g/dL   AST 15 15 - 41 U/L   ALT 15 0 - 44 U/L   Alkaline Phosphatase 32 (L) 38 - 126 U/L   Total Bilirubin 0.9 0.3 - 1.2 mg/dL   GFR calc non Af Amer >60 >60 mL/min   GFR calc Af Amer >60 >60 mL/min   Anion gap 10 5 - 15       IMAGING  ImagingResults  US Pelvis Transvanginal Non-ob (tv Only)  Result Date: 07/21/2018 CLINICAL DATA:  Pelvic pain and heavy bleeding EXAM: TRANSVAGINAL ULTRASOUND OF PELVIS DOPPLER ULTRASOUND OF OVARIES TECHNIQUE: Transvaginal ultrasound examination of the pelvis was performed including evaluation of the uterus, ovaries, adnexal regions, and pelvic cul-de-sac. Color and duplex Doppler ultrasound was utilized to evaluate blood flow to the ovaries. COMPARISON:   06/24/2018, 05/06/2018 FINDINGS: Uterus Measurements: 8.7 x 7.1 x 7.0 cm = volume: 226 mL. No fibroids or other mass visualized. Endometrium Thickness: 22 mm. Thickened and heterogeneous endometrium is noted similar to that seen on prior exam. Right ovary Measurements: 2.8 x 2.0 x 2.7 cm = volume: 8 mL. Normal appearance/no adnexal mass. Left ovary Measurements: 2.5 x 2.4 x 1.6 cm = volume: 5 mL. 3.4 cm cystic area is noted with central septation. This may represent 2 adjacent cysts or a partially septated single cyst. Pulsed Doppler evaluation demonstrates normal low-resistance arterial and venous waveforms in both ovaries. IMPRESSION: No evidence of ovarian torsion. Thickened heterogeneous endometrium similar to that seen on the prior exams. Endometrial sampling has been previously suggested. New cystic lesion within the left ovary which may represent 2 adjacent cysts or a larger partially septated cyst. Electronically Signed   By: Inez Catalina M.D.   On: 07/21/2018 00:57   [place] Sonohysterogram  Result Date: 06/24/2018 CLINICAL DATA:  37 year old female with dysfunctional uterine bleeding. Question focal endometrial lesions on recent pelvic sonogram. EXAM: [place] SONOHYSTEROGRAM TECHNIQUE: Following cleansing of the cervix and vagina with Betadine, a hysterosalpingogram catheter was placed within the endocervical canal. Sonohysterogram was then performed with transvaginal sonography during infusion of sterile saline solution into the endometrial cavity. COMPARISON:  05/06/2018 pelvic sonogram. FINDINGS: Sonohysterography demonstrates prominent very irregular soft tissue thickening of the entire endometrial cavity, without a discrete mass. Bilayer endometrial thickness 25 mm on today's scan. There is scattered vascularity throughout the regions of soft tissue thickening in the endometrial cavity on color Doppler, without a dominant feeding vessel. An asymmetric 2.5 x 1.9 x 2.3 cm irregular focus of soft tissue  thickening in the right fundal endometrial cavity is contiguous with the irregular regions of soft tissue thickening throughout the uterine cavity, and is not considered a discrete endometrial mass. No submucosal or intracavitary fibroids. The procedure was well tolerated by the patient, with no immediate complication. IMPRESSION: Very irregular and prominent soft tissue thickening of the entire endometrial cavity with associated scattered endometrial vascularity on color Doppler, without discrete  endometrial mass. Bilayer endometrial thickness 25 mm. Imaging differential diagnosis includes endometrial hyperplasia or endometrial carcinoma. Consider endometrial tissue sampling. Electronically Signed   By: Ilona Sorrel M.D.   On: 06/24/2018 10:54   US Pelvic Doppler (torsion R/o Or Mass Arterial Flow)  Result Date: 07/21/2018 CLINICAL DATA:  Pelvic pain and heavy bleeding EXAM: TRANSVAGINAL ULTRASOUND OF PELVIS DOPPLER ULTRASOUND OF OVARIES TECHNIQUE: Transvaginal ultrasound examination of the pelvis was performed including evaluation of the uterus, ovaries, adnexal regions, and pelvic cul-de-sac. Color and duplex Doppler ultrasound was utilized to evaluate blood flow to the ovaries. COMPARISON:  06/24/2018, 05/06/2018 FINDINGS: Uterus Measurements: 8.7 x 7.1 x 7.0 cm = volume: 226 mL. No fibroids or other mass visualized. Endometrium Thickness: 22 mm. Thickened and heterogeneous endometrium is noted similar to that seen on prior exam. Right ovary Measurements: 2.8 x 2.0 x 2.7 cm = volume: 8 mL. Normal appearance/no adnexal mass. Left ovary Measurements: 2.5 x 2.4 x 1.6 cm = volume: 5 mL. 3.4 cm cystic area is noted with central septation. This may represent 2 adjacent cysts or a partially septated single cyst. Pulsed Doppler evaluation demonstrates normal low-resistance arterial and venous waveforms in both ovaries. IMPRESSION: No evidence of ovarian torsion. Thickened heterogeneous endometrium similar to that  seen on the prior exams. Endometrial sampling has been previously suggested. New cystic lesion within the left ovary which may represent 2 adjacent cysts or a larger partially septated cyst. Electronically Signed   By: Inez Catalina M.D.   On: 07/21/2018 00:57     MAU Management/MDM: Pt with heavy bleeding, soaked a large pad in less than 1 hour in MAU. Pt writhing in pain and moaning upon arrival. Toradol 60 mg IM given with little relief. Unable to perform pelvic exam due to pain. No rebound tenderness, pain is all suprapubic and adnexal in location. Consult Dr Harolyn Rutherford with assessment and findings. Orders entered for pain management, nausea, and bleeding. Dr Harolyn Rutherford to evaluate pt in MAU.   ASSESSMENT Principal Problem:   Abnormal uterine bleeding (AUB) Active Problems:   Hypertension   Thickened endometrium   History of endometrial hyperplasia   Morbid obesity with BMI of 50.0-59.9, adult [facility])   PLAN Dr Harolyn Rutherford to evaluate pt  Fatima Blank Certified Nurse-Midwife 07/20/2018 10:04 PM   Attestation of Attending Supervision of [place] Provider (PA/CNM/NP): Evaluation and management procedures were performed by the Advanced Practice Provider under my supervision and collaboration.  I have reviewed the Advanced Practice Provider's note and chart, and I agree with the management and plan.  CBC Latest Ref Rng & Units 07/21/2018 07/20/2018 06/24/2018  WBC 4.0 - 10.5 K/uL 7.6 8.0 10.7(H)  Hemoglobin 12.0 - 15.0 g/dL 8.4(L) 9.1(L) 8.8(L)  Hematocrit 36.0 - 46.0 % 29.8(L) 32.1(L) 30.1(L)  Platelets 150 - 400 K/uL 387 384 350    Patient with increased pain and bleeding, associated with nausea and vomiting. Toradol did not help with the pain, unable to get IV access. Anesthesia team called to help with this.  On external exam, no active hemorrhaging noted, but had moderate amount of active bleeding.  Unable to do internal exam due to patient's  discomfort. Stable initial hemoglobin. - Admit for observation, bleeding control and pain management - Obtain and maintain IV access, NS bolus and infusion ordered - [place] IV analgesia (Dilaudid ordered) prn severe pain - IV antiemetics ordered - Transvaginal ultrasound ordered to rule out adnexal pathology given N/V, no torsion seen  - Norethindrone 15 mg po ordered; to  be given after antiemetics.  Will avoid estrogen given history of HTN and thickened endometrial stripe/history of endometrial hyperplasia.   - TXA 1g IV ordered - Type and crossmatch for 3 units - Repeat CBC ordered in the morning - Given ultrasound findings and prolonged duration of bleeding, will add on for Hysteroscopy, D&C in the morning with Dr. Roselie Awkward. - Routine inpatient GYN care.    Verita Schneiders, MD, [place] [facility]  Hysteroscopy D&C was done without complications and the patient was discharged after recovery  Consults: None  Significant Diagnostic Studies: labs:  CBC    Component Value Date/Time   WBC 7.6 07/21/2018 0555   RBC 4.10 07/21/2018 0555   HGB 8.4 (L) 07/21/2018 0555   HGB 8.8 (L) 06/09/2018 1743   HCT 29.8 (L) 07/21/2018 0555   HCT 29.9 (L) 06/09/2018 1743   PLT 387 07/21/2018 0555   PLT 451 (H) 06/09/2018 1743   MCV 72.7 (L) 07/21/2018 0555   MCV 72 (L) 06/09/2018 1743   MCH 20.5 (L) 07/21/2018 0555   MCHC 28.2 (L) 07/21/2018 0555   RDW 15.5 07/21/2018 0555   RDW 14.5 06/09/2018 1743   LYMPHSABS 1.2 07/21/2018 0555   LYMPHSABS 1.8 06/09/2018 1743   MONOABS 0.4 07/21/2018 0555   EOSABS 0.0 07/21/2018 0555   EOSABS 0.3 06/09/2018 1743   BASOSABS 0.0 07/21/2018 0555   BASOSABS 0.0 06/09/2018 1743     Treatments: surgery: hysteroscopy D&C  Discharge Exam: Blood pressure (!) 152/84, pulse 82, temperature 98.6 F (37 C), temperature source Oral, resp. rate 20, height 5\' 5"   (1.651 m), weight (!) 144.2 kg, SpO2 99 %. General appearance: alert, cooperative and no distress GI: soft, non-tender; bowel sounds normal; no masses,  no organomegaly Extremities: extremities normal, atraumatic, no cyanosis or edema  Disposition: Discharge disposition: 01-Home or Self Care       Discharge Instructions    Discharge patient   Complete by:  As directed    Discharge disposition:  01-Home or Self Care   Discharge patient date:  07/21/2018     Allergies as of 07/21/2018      Reactions   Codeine Itching   Peanut-containing Drug Products Itching   Penicillins Hives   Has patient had a PCN reaction causing immediate rash, facial/tongue/throat swelling, SOB or lightheadedness with hypotension: No Has patient had a PCN reaction causing severe rash involving mucus membranes or skin necrosis: No Has patient had a PCN reaction that required hospitalization No Has patient had a PCN reaction occurring within the last 10 years: No If all of the above answers are "NO", then may proceed with Cephalosporin use.   Shellfish Allergy Itching      Medication List    STOP taking these medications   ketorolac 10 MG tablet Commonly known as:  TORADOL   medroxyPROGESTERone 10 MG tablet Commonly known as:  PROVERA   oxyCODONE-acetaminophen 10-325 MG tablet Commonly known as:  PERCOCET Replaced by:  oxyCODONE-acetaminophen 5-325 MG tablet     TAKE these medications   albuterol 108 (90 Base) MCG/ACT inhaler Commonly known as:  PROVENTIL HFA;VENTOLIN HFA Inhale 2 puffs into the lungs every 4 (four) hours as needed for wheezing or shortness of breath (cough, shortness of breath or wheezing.).   amLODipine 5 MG tablet Commonly known as:  NORVASC Take 1 tablet (5 mg total) by mouth daily.   ferrous sulfate 325 (65 FE) MG tablet Take 1  tablet (325 mg total) by mouth 2 (two) times daily with a meal.   norethindrone 5 MG tablet Commonly known as:  AYGESTIN Take 2 tablets (10  mg total) by mouth daily. Start taking on:  July 22, 2018   nystatin powder Commonly known as:  MYCOSTATIN/NYSTOP Apply topically 3 (three) times daily.   oxyCODONE-acetaminophen 5-325 MG tablet Commonly known as:  PERCOCET/ROXICET Take 1 tablet by mouth every 6 (six) hours as needed for moderate pain. Replaces:  oxyCODONE-acetaminophen 10-325 MG tablet   tranexamic acid 650 MG Tabs tablet Commonly known as:  LYSTEDA Take 2 tablets (1,300 mg total) by mouth 3 (three) times daily. Take during menses for a maximum of five days   triamcinolone cream 0.1 % Commonly known as:  KENALOG Apply 1 application topically 2 (two) times daily.      Follow-up Information    Follow up On 08/11/2018.           Signed: Emeterio Reeve 07/21/2018, 1:21 PM

## 2018-07-21 NOTE — Anesthesia Postprocedure Evaluation (Signed)
Anesthesia Post Note  Patient: Molly Archer  Procedure(s) Performed: DILATATION AND CURETTAGE /HYSTEROSCOPY (N/A )     Patient location during evaluation: PACU Anesthesia Type: General Level of consciousness: awake and alert Pain management: pain level controlled Vital Signs Assessment: post-procedure vital signs reviewed and stable Respiratory status: spontaneous breathing, nonlabored ventilation and respiratory function stable Cardiovascular status: blood pressure returned to baseline and stable Postop Assessment: no apparent nausea or vomiting Anesthetic complications: no    Last Vitals:  Vitals:   07/21/18 1100 07/21/18 1115  BP: (!) 148/80 (!) 155/83  Pulse: 71 78  Resp: 15 (!) 23  Temp:  36.9 C  SpO2: 99% 97%    Last Pain:  Vitals:   07/21/18 1100  TempSrc:   PainSc: 6    Pain Goal:                 Lynda Rainwater

## 2018-07-21 NOTE — Anesthesia Procedure Notes (Signed)
Procedure Name: LMA Insertion Date/Time: 07/21/2018 9:22 AM Performed by: Ignacia Bayley, CRNA Pre-anesthesia Checklist: Patient identified, Patient being monitored, Emergency Drugs available, Timeout performed and Suction available Patient Re-evaluated:Patient Re-evaluated prior to induction Oxygen Delivery Method: Circle System Utilized Preoxygenation: Pre-oxygenation with 100% oxygen Induction Type: IV induction Ventilation: Mask ventilation without difficulty LMA: LMA inserted LMA Size: 4.0 Number of attempts: 1 Placement Confirmation: positive ETCO2 and breath sounds checked- equal and bilateral

## 2018-07-21 NOTE — Discharge Instructions (Signed)
Hysteroscopy, Care After  This sheet gives you information about how to care for yourself after your procedure. Your health care provider may also give you more specific instructions. If you have problems or questions, contact your health care provider.  What can I expect after the procedure?  After the procedure, it is common to have:  · Cramping.  · Bleeding. This can vary from light spotting to menstrual-like bleeding.  Follow these instructions at home:  Activity  · Rest for 1-2 days after the procedure.  · Do not douche, use tampons, or have sex for 2 weeks after the procedure, or until your health care provider approves.  · Do not drive for 24 hours after the procedure, or for as long as told by your health care provider.  · Do not drive, use heavy machinery, or drink alcohol while taking prescription pain medicines.  Medicines    · Take over-the-counter and prescription medicines only as told by your health care provider.  · Do not take aspirin during recovery. It can increase the risk of bleeding.  General instructions  · Do not take baths, swim, or use a hot tub until your health care provider approves. Take showers instead of baths for 2 weeks, or for as long as told by your health care provider.  · To prevent or treat constipation while you are taking prescription pain medicine, your health care provider may recommend that you:  ? Drink enough fluid to keep your urine clear or pale yellow.  ? Take over-the-counter or prescription medicines.  ? Eat foods that are high in fiber, such as fresh fruits and vegetables, whole grains, and beans.  ? Limit foods that are high in fat and processed sugars, such as fried and sweet foods.  · Keep all follow-up visits as told by your health care provider. This is important.  Contact a health care provider if:  · You feel dizzy or lightheaded.  · You feel nauseous.  · You have abnormal vaginal discharge.  · You have a rash.  · You have pain that does not get better with  medicine.  · You have chills.  Get help right away if:  · You have bleeding that is heavier than a normal menstrual period.  · You have a fever.  · You have pain or cramps that get worse.  · You develop new abdominal pain.  · You faint.  · You have pain in your shoulders.  · You have shortness of breath.  Summary  · After the procedure, you may have cramping and some vaginal bleeding.  · Do not douche, use tampons, or have sex for 2 weeks after the procedure, or until your health care provider approves.  · Do not take baths, swim, or use a hot tub until your health care provider approves. Take showers instead of baths for 2 weeks, or for as long as told by your health care provider.  · Report any unusual symptoms to your health care provider.  · Keep all follow-up visits as told by your health care provider. This is important.  This information is not intended to replace advice given to you by your health care provider. Make sure you discuss any questions you have with your health care provider.  Document Released: 05/06/2013 Document Revised: 08/14/2016 Document Reviewed: 08/14/2016  Elsevier Interactive Patient Education © 2019 [employer]

## 2018-07-21 NOTE — Plan of Care (Signed)
Discharge and medication instructions reviewed with patient.

## 2018-07-21 NOTE — Op Note (Signed)
PREOPERATIVE DIAGNOSIS:  Abnormal uterine bleeding POSTOPERATIVE DIAGNOSIS: The same PROCEDURE: Hysteroscopy, Dilation and Curettage. SURGEON:  Dr. Emeterio Reeve   INDICATIONS: 37 y.o. G0P0000  here for scheduled surgery for the aforementioned diagnoses.   Risks of surgery were discussed with the patient including but not limited to: bleeding which may require transfusion; infection which may require antibiotics; injury to uterus or surrounding organs; intrauterine scarring which may impair future fertility; need for additional procedures including laparotomy or laparoscopy; and other postoperative/anesthesia complications. Written informed consent was obtained.    FINDINGS:  A 6 week size uterus.  Diffuse proliferative endometrium.  Normal ostia bilaterally were not well visualized  ANESTHESIA:   General, paracervical block with 6 ml of 0.25% Marcaine with epinephrine INTRAVENOUS FLUIDS:  1000 ml of LR FLUID DEFICITS:  271ml of saline ESTIMATED BLOOD LOSS:  Less than 20 ml SPECIMENS: Endometrial curettings sent to pathology COMPLICATIONS:  None immediate.  PROCEDURE DETAILS:  The patient was taken to the operating room where general anesthesia was administered and was found to be adequate.  After an adequate timeout was performed, she was placed in the dorsal lithotomy position and examined; then prepped and draped in the sterile manner. A speculum was then placed in the patient's vagina and a single tooth tenaculum was applied to the anterior lip of the cervix.   A paracervical block using 6 ml of 0.25% Marcaine was administered.  The cervix was sounded to 9 cm and dilated manually with metal dilators to accommodate the 5 mm diagnostic hysteroscope.  Once the cervix was dilated, the hysteroscope was inserted under direct visualization using saline as a suspension medium.  The uterine cavity was carefully examined with the findings as noted above.   After further careful visualization of the uterine  cavity, the hysteroscope was removed under direct visualization.  A sharp curettage was then performed to obtain a generous amount of endometrial curettings.  The tenaculum was removed from the anterior lip of the cervix and the vaginal speculum was removed after noting good hemostasis.  The patient tolerated the procedure well and was taken to the recovery area awake, extubated and in stable condition.  The patient will be discharged to home after transfer back to [facility].  Routine postoperative instructions given.  She was prescribed Percocet, Ibuprofen.  She will follow up in the clinic  for postoperative evaluation.  Woodroe Mode, MD 07/21/2018 11:05 AM

## 2018-07-21 NOTE — Transfer of Care (Signed)
Immediate Anesthesia Transfer of Care Note  Patient: Molly Archer  Procedure(s) Performed: DILATATION AND CURETTAGE /HYSTEROSCOPY (N/A )  Patient Location: PACU  Anesthesia Type:General  Level of Consciousness: sedated  Airway & Oxygen Therapy: Patient Spontanous Breathing and Patient connected to nasal cannula oxygen  Post-op Assessment: Report given to RN and Post -op Vital signs reviewed and stable  Post vital signs: stable  Last Vitals:  Vitals Value Taken Time  BP    Temp    Pulse    Resp    SpO2      Last Pain:  Vitals:   07/21/18 0828  TempSrc: Oral  PainSc:          Complications: No apparent anesthesia complications

## 2018-07-22 ENCOUNTER — Encounter (HOSPITAL_COMMUNITY): Payer: Self-pay | Admitting: Obstetrics & Gynecology

## 2018-07-24 ENCOUNTER — Encounter (HOSPITAL_COMMUNITY): Payer: Self-pay | Admitting: *Deleted

## 2018-07-24 ENCOUNTER — Other Ambulatory Visit: Payer: Self-pay

## 2018-07-24 ENCOUNTER — Inpatient Hospital Stay (HOSPITAL_COMMUNITY)
Admission: AD | Admit: 2018-07-24 | Discharge: 2018-07-24 | Disposition: A | Discharge status: Home / Self Care | Payer: BLUE CROSS/BLUE SHIELD | Source: Ambulatory Visit | Attending: Obstetrics & Gynecology | Admitting: Obstetrics & Gynecology

## 2018-07-24 DIAGNOSIS — G8918 Other acute postprocedural pain: Secondary | ICD-10-CM

## 2018-07-24 DIAGNOSIS — I10 Essential (primary) hypertension: Secondary | ICD-10-CM | POA: Insufficient documentation

## 2018-07-24 DIAGNOSIS — Z880 Allergy status to penicillin: Secondary | ICD-10-CM | POA: Diagnosis not present

## 2018-07-24 DIAGNOSIS — R109 Unspecified abdominal pain: Secondary | ICD-10-CM | POA: Diagnosis present

## 2018-07-24 LAB — CBC WITH DIFFERENTIAL/PLATELET
Basophils Absolute: 0 10*3/uL (ref 0.0–0.1)
Basophils Relative: 0 %
Eosinophils Absolute: 0.2 10*3/uL (ref 0.0–0.5)
Eosinophils Relative: 2 %
HCT: 30.8 % — ABNORMAL LOW (ref 36.0–46.0)
Hemoglobin: 8.5 g/dL — ABNORMAL LOW (ref 12.0–15.0)
Lymphocytes Relative: 29 %
Lymphs Abs: 2.2 10*3/uL (ref 0.7–4.0)
MCH: 20.2 pg — AB (ref 26.0–34.0)
MCHC: 27.6 g/dL — AB (ref 30.0–36.0)
MCV: 73.3 fL — ABNORMAL LOW (ref 80.0–100.0)
Monocytes Absolute: 0.6 10*3/uL (ref 0.1–1.0)
Monocytes Relative: 8 %
Neutro Abs: 4.7 10*3/uL (ref 1.7–7.7)
Neutrophils Relative %: 61 %
Platelets: 369 10*3/uL (ref 150–400)
RBC: 4.2 MIL/uL (ref 3.87–5.11)
RDW: 15.7 % — ABNORMAL HIGH (ref 11.5–15.5)
WBC: 7.7 10*3/uL (ref 4.0–10.5)
nRBC: 0 % (ref 0.0–0.2)

## 2018-07-24 MED ORDER — KETOROLAC TROMETHAMINE 60 MG/2ML IM SOLN
60.0000 mg | Freq: Once | INTRAMUSCULAR | Status: AC
  Administered 2018-07-24: 60 mg via INTRAMUSCULAR
  Filled 2018-07-24: qty 2

## 2018-07-24 MED ORDER — SIMETHICONE 80 MG PO CHEW
160.0000 mg | CHEWABLE_TABLET | Freq: Once | ORAL | Status: AC
  Administered 2018-07-24: 160 mg via ORAL
  Filled 2018-07-24: qty 2

## 2018-07-24 MED ORDER — AMLODIPINE BESYLATE 5 MG PO TABS
5.0000 mg | ORAL_TABLET | Freq: Once | ORAL | Status: AC
  Administered 2018-07-24: 5 mg via ORAL
  Filled 2018-07-24: qty 1

## 2018-07-24 MED ORDER — OXYCODONE-ACETAMINOPHEN 5-325 MG PO TABS
2.0000 | ORAL_TABLET | Freq: Once | ORAL | Status: AC
  Administered 2018-07-24: 2 via ORAL
  Filled 2018-07-24: qty 2

## 2018-07-24 MED ORDER — IBUPROFEN 800 MG PO TABS: 800.0000 mg | ORAL_TABLET | Freq: Three times a day (TID) | ORAL | 0 refills | Status: DC

## 2018-07-24 MED ORDER — OXYCODONE-ACETAMINOPHEN 5-325 MG PO TABS: 1.0000 | ORAL_TABLET | Freq: Four times a day (QID) | ORAL | 0 refills | Status: DC | PRN

## 2018-07-24 NOTE — MAU Note (Signed)
Pt presents to MAU c/o vaginal bleeding and pain post OP. Pt had a D&C on the 23rd bleeding stopped and came back on the 24th and then stopped and returned today. Pt states that her medications are not working even with doubling up on her meds today. Pt reports the pain is 10/10 in her pelvis "cramping." pt states the bleeding started a hour ago and she only has worn 1 pad. Pt reports constipation.

## 2018-07-24 NOTE — MAU Note (Signed)
Lab called no answer.

## 2018-07-24 NOTE — MAU Provider Note (Signed)
History     CSN: [id number]  Arrival date and time: 07/24/18 0102   First Provider Initiated Contact with Patient 07/24/18 0222      Chief Complaint  Patient presents with  . Vaginal Bleeding  . Abdominal Pain   HPI Molly Archer is a 37 y.o. G0P0000 non pregnant female who presents with abdominal pain and bleeding after a D&C on 12/23. She states she had no pain or bleeding when she was discharged from the hospital but cramping started on 12/24. She states she took 1 percocet that relieved the pain then. She states she had no pain all day on 12/25 but at midnight started cramping again. She took one percocet then with no relief. She does not take ibuprofen "because it never works." She also reports some constipation. Last bowel movement today but it was hard. She has hypertension and is not taking her Norvasc because she ran out.   OB History    Gravida  0   Para  0   Term  0   Preterm  0   AB  0   Living  0     SAB  0   TAB  0   Ectopic  0   Multiple  0   Live Births              Past Medical History:  Diagnosis Date  . Anemia   . Asthma    only uses inhaler in fall and spring  . GERD (gastroesophageal reflux disease)    diet controlled, occ. uses omeprazole  . Headache    otc med prn  . History of kidney stones   . Hx of Bell's palsy June 2013  . Hypertension    currently no meds  . Menometrorrhagia   . Psoriasis   . Shortness of breath dyspnea    with exercise/exertion    Past Surgical History:  Procedure Laterality Date  . COMBINED HYSTEROSCOPY DIAGNOSTIC / D&C N/A 2011   Benign secretory endometrium  . CYSTOSCOPY WITH STENT PLACEMENT Left 10/19/2014   Procedure: CYSTOSCOPY WITH STENT PLACEMENT;  Surgeon: Ardis Hughs, MD;  Location: [facility];  Service: Urology;  Laterality: Left;  . DILATION AND CURETTAGE OF UTERUS  10/2003   Polyp, mild atypia, simple and complex hyperplasia,  . DILATION AND CURETTAGE OF UTERUS  11/2003   Simple  and complex hyperplasia  . DILATION AND CURETTAGE OF UTERUS  2011  . DILATION AND CURETTAGE OF UTERUS N/A 12/14/2014   Procedure: DILATATION AND CURETTAGE;  Surgeon: Mora Bellman, MD;  Location: [facility];  Service: Gynecology;  Laterality: N/A;  . HOLMIUM LASER APPLICATION Left 0/73/7106   Procedure: HOLMIUM LASER APPLICATION;  Surgeon: Ardis Hughs, MD;  Location: [facility];  Service: Urology;  Laterality: Left;  . HYSTEROSCOPY W/D&C N/A 07/21/2018   Procedure: DILATATION AND CURETTAGE /HYSTEROSCOPY;  Surgeon: Woodroe Mode, MD;  Location: [facility];  Service: Gynecology;  Laterality: N/A;  . INTRAUTERINE DEVICE (IUD) INSERTION N/A 12/14/2014   Procedure: INTRAUTERINE DEVICE (IUD) INSERTION;  Surgeon: Mora Bellman, MD;  Location: [facility];  Service: Gynecology;  Laterality: N/A;  . STONE EXTRACTION WITH BASKET Left 10/19/2014   Procedure: STONE EXTRACTION WITH BASKET;  Surgeon: Ardis Hughs, MD;  Location: [facility];  Service: Urology;  Laterality: Left;  . WISDOM TOOTH EXTRACTION      Family History  Problem Relation Age of Onset  . Hypertension Other   . Diabetes Other   . Cancer  Other     Social History   Tobacco Use  . Smoking status: Never Smoker  . Smokeless tobacco: Never Used  Substance Use Topics  . Alcohol use: No  . Drug use: No    Allergies:  Allergies  Allergen Reactions  . Codeine Itching  . Peanut-Containing Drug Products Itching  . Penicillins Hives    Has patient had a PCN reaction causing immediate rash, facial/tongue/throat swelling, SOB or lightheadedness with hypotension: No Has patient had a PCN reaction causing severe rash involving mucus membranes or skin necrosis: No Has patient had a PCN reaction that required hospitalization No Has patient had a PCN reaction occurring within the last 10 years: No If all of the above answers are "NO", then may proceed with Cephalosporin use.   . Shellfish Allergy Itching    Medications Prior to Admission   Medication Sig Dispense Refill Last Dose  . norethindrone (AYGESTIN) 5 MG tablet Take 2 tablets (10 mg total) by mouth daily. 30 tablet 1 07/23/2018 at Unknown time  . oxyCODONE-acetaminophen (PERCOCET/ROXICET) 5-325 MG tablet Take 1 tablet by mouth every 6 (six) hours as needed for moderate pain. 10 tablet 0 07/24/2018 at Unknown time  . triamcinolone cream (KENALOG) 0.1 % Apply 1 application topically 2 (two) times daily. 30 g 0 Past Week at Unknown time  . albuterol (PROVENTIL HFA;VENTOLIN HFA) 108 (90 Base) MCG/ACT inhaler Inhale 2 puffs into the lungs every 4 (four) hours as needed for wheezing or shortness of breath (cough, shortness of breath or wheezing.). 1 Inhaler 1 Unknown at Unknown time  . amLODipine (NORVASC) 5 MG tablet Take 1 tablet (5 mg total) by mouth daily. 90 tablet 3 07/20/2018 at Unknown time  . ferrous sulfate 325 (65 FE) MG tablet Take 1 tablet (325 mg total) by mouth 2 (two) times daily with a meal. 60 tablet 3 Unknown at Unknown time  . nystatin (MYCOSTATIN/NYSTOP) powder Apply topically 3 (three) times daily. (Patient not taking: Reported on 04/30/2018) 30 g 0 Not Taking  . tranexamic acid (LYSTEDA) 650 MG TABS tablet Take 2 tablets (1,300 mg total) by mouth 3 (three) times daily. Take during menses for a maximum of five days (Patient not taking: Reported on 06/17/2018) 30 tablet 2 Not Taking    Review of Systems  Constitutional: Negative.  Negative for fatigue and fever.  HENT: Negative.   Respiratory: Negative.  Negative for shortness of breath.   Cardiovascular: Negative.  Negative for chest pain.  Gastrointestinal: Positive for abdominal pain. Negative for constipation, diarrhea, nausea and vomiting.  Genitourinary: Positive for vaginal bleeding. Negative for dysuria.  Neurological: Negative.  Negative for dizziness and headaches.   Physical Exam   Blood pressure (!) 177/81, pulse 66, temperature 98.7 F (37.1 C), temperature source Oral.  Physical Exam   Nursing note and vitals reviewed. Constitutional: She is oriented to person, place, and time. She appears well-developed and well-nourished. No distress.  HENT:  Head: Normocephalic.  Eyes: Pupils are equal, round, and reactive to light.  Cardiovascular: Normal rate, regular rhythm and normal heart sounds.  Respiratory: Effort normal and breath sounds normal. No respiratory distress.  GI: Soft. Bowel sounds are normal. She exhibits no distension. There is no abdominal tenderness. There is no rebound and no guarding.  Difficult exam due to body habitus  Genitourinary:    Genitourinary Comments: Scant bleeding noted   Neurological: She is alert and oriented to person, place, and time.  Skin: Skin is warm and dry.  Psychiatric: She  has a normal mood and affect. Her behavior is normal. Judgment and thought content normal.    MAU Course  Procedures Results for orders placed or performed during the hospital encounter of 07/24/18 (from the past 24 hour(s))  CBC with Differential/Platelet     Status: Abnormal   Collection Time: 07/24/18  2:38 AM  Result Value Ref Range   WBC 7.7 4.0 - 10.5 K/uL   RBC 4.20 3.87 - 5.11 MIL/uL   Hemoglobin 8.5 (L) 12.0 - 15.0 g/dL   HCT 30.8 (L) 36.0 - 46.0 %   MCV 73.3 (L) 80.0 - 100.0 fL   MCH 20.2 (L) 26.0 - 34.0 pg   MCHC 27.6 (L) 30.0 - 36.0 g/dL   RDW 15.7 (H) 11.5 - 15.5 %   Platelets 369 150 - 400 K/uL   nRBC 0.0 0.0 - 0.2 %   Neutrophils Relative % 61 %   Neutro Abs 4.7 1.7 - 7.7 K/uL   Lymphocytes Relative 29 %   Lymphs Abs 2.2 0.7 - 4.0 K/uL   Monocytes Relative 8 %   Monocytes Absolute 0.6 0.1 - 1.0 K/uL   Eosinophils Relative 2 %   Eosinophils Absolute 0.2 0.0 - 0.5 K/uL   Basophils Relative 0 %   Basophils Absolute 0.0 0.0 - 0.1 K/uL     MDM CBC with Diff Toradol IM Simethicone Norvasc 5mg  Percocet PO Patient resting comfortably in bed, VSS, normal exam  Lengthy discussion with patient about normalcy of pain after surgery.  Encouraged patient to take ibuprofen and percocet as prescribed on a schedule. Warning signs of infection reviewed with patient.  Patient and mother demanding answers tonight and stating nothing is being done for her. She states "I just keep having D&Cs and nothing is fixing it." Patient and mother both state they are frustrated about having to come to MAU. Explained to patient that MAU is to rule out emergent situations and they need to follow up in the clinic. Patient's mother is unhappy with clinic and states they will just come back to MAU if bleeding and pain continues. Patient encouraged to seek a second opinion if they are unhappy with [facility]. Patient states "I've already done that and no one will do anything." Patient and mother adamant that they will keep coming to MAU or another ER despite CNM attempting to encourage patient to follow up in the outpatient setting.   Assessment and Plan   1. Post-op pain   2. Essential hypertension    -Discharge home in stable condition -Rx for limited number of percocet. Patient instructed to pick up norvasc at pharmacy, has 3 refills already -Pain and bleeding precautions discussed -Patient advised to follow-up with [facility] for ongoing management of bleeding and pain -Patient may return to MAU as needed or if her condition were to change or worsen  Wende Mott CNM 07/24/2018, 2:22 AM

## 2018-07-28 ENCOUNTER — Encounter: Payer: Self-pay | Admitting: Internal Medicine

## 2018-07-28 ENCOUNTER — Telehealth: Payer: Self-pay | Admitting: *Deleted

## 2018-07-28 ENCOUNTER — Ambulatory Visit (INDEPENDENT_AMBULATORY_CARE_PROVIDER_SITE_OTHER): Payer: BLUE CROSS/BLUE SHIELD | Admitting: Internal Medicine

## 2018-07-28 VITALS — BP 171/99 | HR 86 | Ht 65.0 in | Wt 321.4 lb

## 2018-07-28 DIAGNOSIS — I10 Essential (primary) hypertension: Secondary | ICD-10-CM | POA: Diagnosis not present

## 2018-07-28 DIAGNOSIS — G8918 Other acute postprocedural pain: Secondary | ICD-10-CM

## 2018-07-28 DIAGNOSIS — K5900 Constipation, unspecified: Secondary | ICD-10-CM | POA: Diagnosis not present

## 2018-07-28 DIAGNOSIS — N939 Abnormal uterine and vaginal bleeding, unspecified: Secondary | ICD-10-CM | POA: Diagnosis not present

## 2018-07-28 MED ORDER — OXYCODONE-ACETAMINOPHEN 5-325 MG PO TABS: 1.0000 | ORAL_TABLET | ORAL | 0 refills | Status: DC | PRN

## 2018-07-28 MED ORDER — POLYETHYLENE GLYCOL 3350 17 GM/SCOOP PO POWD: 17.0000 g | Freq: Every day | ORAL | 1 refills | Status: DC

## 2018-07-28 MED ORDER — DICLOFENAC SODIUM 75 MG PO TBEC: 75.0000 mg | DELAYED_RELEASE_TABLET | Freq: Two times a day (BID) | ORAL | 0 refills | Status: DC

## 2018-07-28 NOTE — Telephone Encounter (Signed)
Pt reports cramping pain when she is having vaginal bleeding that comes and goes and is worse at night.  Pt unsure what to do.  Recommended pt come to the walk in clinic.  Pt verbalized understanding.

## 2018-07-28 NOTE — Progress Notes (Signed)
Subjective:    Molly Archer - 37 y.o. female MRN [id number]  Date of birth: Sep 22, 1980  HPI  Molly Archer is a 37 y.o. G0P0000 female here for bleeding and cramping s/p D&C on 12/23 for long history of AUB. Patient seen in MAU with same complaint on 12/26 and given Percocet and Ibuprofen for post-operative pain. BP also noted to be elevated. Patient with history of HTN. Refills provided for her Norvasc.   Today, patient reports that she has improved somewhat and that the worst episode of bleeding and cramping was three days ago. She has not passed any large clots. She denies lightheadedness or dizziness. HgB was 8.5 (stable from 8.4 post-operatively) in MAU on 12/26. She is currently not taking Fe supplements due to constipation. Taking stool softeners but still having trouble passing bowel movement. Denies fevers or vomiting. No vaginal discharge present. No vaginal pain.     OB History    Gravida  0   Para  0   Term  0   Preterm  0   AB  0   Living  0     SAB  0   TAB  0   Ectopic  0   Multiple  0   Live Births              -  reports that she has never smoked. She has never used smokeless tobacco. - Review of Systems: Per HPI. - Past Medical History: Patient Active Problem List   Diagnosis Date Noted  . Thickened endometrium 07/20/2018  . History of endometrial hyperplasia 07/20/2018  . Morbid obesity with BMI of 50.0-59.9, adult ([place]) 07/20/2018  . Abnormal uterine bleeding (AUB)   . Hypertension 02/25/2012  . Hx of Bell's palsy 02/25/2012   - Medications: reviewed and updated   Objective:   Physical Exam BP (!) 171/99   Pulse 86   Ht 5\' 5"  (1.651 m)   Wt (!) 145.8 kg   BMI 53.48 kg/m  Gen: NAD, alert, cooperative with exam, well-appearing Abd: SNTND, BS present, no guarding or organomegaly, exam limited due to body habitus GU/GYN: Declines pelvic exam.     Assessment & Plan:   1. Abnormal uterine bleeding Long standing history.  Explained to patient that anticipate bleeding and cramping for two weeks post D&C. HgB low but stable. No signs of symptomatic anemia. Recommended resumption of Fe supplements once constipation improves. Recommend long term follow up with GYN for management.   2. Post-operative pain Switch to diclofenac for better ease of taking NSAIDs. Additional 10 tablets of Percocet given. Abdominal exam is benign. Pelvic exam would likely not change management at present. No signs or symptoms of infection. Return precautions to MAU discussed. Follow up with Dr. Roselie Awkward scheduled in one week  - oxyCODONE-acetaminophen (PERCOCET/ROXICET) 5-325 MG tablet; Take 1 tablet by mouth every 4 (four) hours as needed for severe pain.  Dispense: 10 tablet; Refill: 0 - diclofenac (VOLTAREN) 75 MG EC tablet; Take 1 tablet (75 mg total) by mouth 2 (two) times daily with a meal.  Dispense: 60 tablet; Refill: 0  3. Essential hypertension Patient has not been taking Norvasc. Re-iterated importance of compliance.   4. Constipation, unspecified constipation type -diet and lifestyle modifications discussed  - docusate sodium (COLACE) 100 MG capsule; Take 100 mg by mouth 2 (two) times daily. - polyethylene glycol powder (GLYCOLAX/MIRALAX) powder; Take 17 g by mouth daily.  Dispense: 500 g; Refill: 1    Routine preventative  health maintenance measures emphasized. Please refer to After Visit Summary for other counseling recommendations.   Phill Myron, D.O. [place] Fellow  07/28/2018

## 2018-07-28 NOTE — Progress Notes (Signed)
Pt states she has still been having episodes of bad abdominal cramping and bleeding with clots since surgical procedure on 12/23. The worst episode was 3 days ago - somewhat improved since then. Pt states she is also having constipation and gas despite taking stool softener.

## 2018-07-28 NOTE — Telephone Encounter (Signed)
Pt left VM message when office was closed. She was crying uncontrollably and stated that she was having a lot of cramping, bleeding with clots and much pain. She further stated that she was told not to come back to MAU. She requested a call back from a nurse to tell her what she should do.

## 2018-07-28 NOTE — Patient Instructions (Signed)
I am sorry to hear that you are still experiencing pain and bleeding. I have sent more Percocet to your pharmacy as well as an anti-inflammatory to your pharmacy called Diclofenac. Stop taking the high dose Ibuprofen. I have also sent Miralax to your pharmacy. Once your constipation improves, I would recommend starting the iron supplement due to your low blood counts. Please follow up with Dr. Roselie Awkward at the visit you already have scheduled.

## 2018-08-04 ENCOUNTER — Ambulatory Visit (INDEPENDENT_AMBULATORY_CARE_PROVIDER_SITE_OTHER): Payer: BLUE CROSS/BLUE SHIELD | Admitting: Family Medicine

## 2018-08-04 ENCOUNTER — Ambulatory Visit (INDEPENDENT_AMBULATORY_CARE_PROVIDER_SITE_OTHER): Payer: BLUE CROSS/BLUE SHIELD | Admitting: Obstetrics & Gynecology

## 2018-08-04 ENCOUNTER — Encounter: Payer: Self-pay | Admitting: Obstetrics & Gynecology

## 2018-08-04 VITALS — BP 162/93 | HR 101 | Wt 326.4 lb

## 2018-08-04 DIAGNOSIS — N939 Abnormal uterine and vaginal bleeding, unspecified: Secondary | ICD-10-CM | POA: Diagnosis not present

## 2018-08-04 MED ORDER — NORETHINDRONE ACETATE 5 MG PO TABS: 10.0000 mg | ORAL_TABLET | Freq: Every day | ORAL | 3 refills | Status: DC

## 2018-08-04 NOTE — Progress Notes (Signed)
Subjective:     Molly Archer is a 38 y.o. female who presents to the clinic 3 weeks status post diagnostic hysteroscopy and D&C for abnormal uterine bleeding. Eating a regular diet without difficulty. Bowel movements are normal. The patient is not having any pain.  The following portions of the patient's history were reviewed and updated as appropriate: allergies, current medications, past family history, past medical history, past social history, past surgical history and problem list.  Review of Systems Pertinent items are noted in HPI.    Objective:    BP (!) 162/93   Pulse (!) 101   Wt (!) 326 lb 6.4 oz (148.1 kg)   LMP 07/25/2018 (Approximate)   BMI 54.32 kg/m  General:  alert, cooperative and no distress  Abdomen: non-tender  Incision:   n/a     Assessment:    Doing well postoperatively. Operative findings again reviewed. Pathology report discussed.    Plan:    1. Continue any current medications. 2. Wound care discussed. 3. Activity restrictions: none 4. Anticipated return to work: now. 5. Follow up: 3 months  Continue Aygestin and d/c DMPA   Patient ID: Molly Archer, female   DOB: [birth year], 38 y.o.   MRN: [id number] Molly Mode, MD 08/04/2018

## 2018-08-07 ENCOUNTER — Encounter (INDEPENDENT_AMBULATORY_CARE_PROVIDER_SITE_OTHER): Payer: BLUE CROSS/BLUE SHIELD

## 2018-08-11 ENCOUNTER — Ambulatory Visit (INDEPENDENT_AMBULATORY_CARE_PROVIDER_SITE_OTHER): Payer: BLUE CROSS/BLUE SHIELD | Admitting: Family Medicine

## 2018-08-11 ENCOUNTER — Encounter (INDEPENDENT_AMBULATORY_CARE_PROVIDER_SITE_OTHER): Payer: Self-pay | Admitting: Family Medicine

## 2018-08-11 VITALS — BP 153/86 | HR 82 | Temp 98.2°F | Ht 64.0 in | Wt 322.0 lb

## 2018-08-11 DIAGNOSIS — Z1331 Encounter for screening for depression: Secondary | ICD-10-CM | POA: Diagnosis not present

## 2018-08-11 DIAGNOSIS — Z9189 Other specified personal risk factors, not elsewhere classified: Secondary | ICD-10-CM | POA: Diagnosis not present

## 2018-08-11 DIAGNOSIS — R5383 Other fatigue: Secondary | ICD-10-CM | POA: Diagnosis not present

## 2018-08-11 DIAGNOSIS — Z0289 Encounter for other administrative examinations: Secondary | ICD-10-CM

## 2018-08-11 DIAGNOSIS — R0602 Shortness of breath: Secondary | ICD-10-CM | POA: Diagnosis not present

## 2018-08-11 DIAGNOSIS — R7303 Prediabetes: Secondary | ICD-10-CM

## 2018-08-11 DIAGNOSIS — Z6841 Body Mass Index (BMI) 40.0 and over, adult: Secondary | ICD-10-CM

## 2018-08-11 DIAGNOSIS — I10 Essential (primary) hypertension: Secondary | ICD-10-CM | POA: Diagnosis not present

## 2018-08-11 NOTE — Progress Notes (Signed)
Office: [phone]  /  Fax: [phone]   Dear Molly Archer D. Timmothy Euler, PA-C,   Thank you for referring Molly Archer to our clinic. The following note includes my evaluation and treatment recommendations.  HPI:   Chief Complaint: OBESITY    [name] has been referred by [name], PA-C for consultation regarding her obesity and obesity related comorbidities.    Molly Archer (MR# [id number]) is a 38 y.o. female who presents on 08/11/2018 for obesity evaluation and treatment. Current BMI is Body mass index is 55.27 kg/m.Molly Archer has been struggling with her weight for many years and has been unsuccessful in either losing weight, maintaining weight loss, or reaching her healthy weight goal.     Molly Archer attended our information session and states she is currently in the action stage of change and ready to dedicate time achieving and maintaining a healthier weight. Molly Archer is interested in becoming our patient and working on intensive lifestyle modifications including (but not limited to) diet, exercise and weight loss.    Molly Archer states her family eats meals together her desired weight loss is 72 lbs she has been heavy most of  her life she started gaining weight as a child her heaviest weight ever was 338 lbs she snacks frequently in the evenings she skips meals frequently she is frequently drinking liquids with calories she frequently makes poor food choices she frequently eats larger portions than normal  she struggles with emotional eating    [name] feels her energy is lower than it should be. This has worsened with weight gain and has not worsened recently. Molly Archer admits to daytime somnolence and  admits to waking up still tired. Patient is at risk for obstructive sleep apnea. Patent has a history of symptoms of daytime fatigue. Patient generally gets 5 hours of sleep per night, and states they generally have generally restful sleep. Snoring is  present. Apneic episodes are not present. Epworth Sleepiness Score is 9.  Dyspnea on exertion Molly Archer notes increasing shortness of breath with exercising and seems to be worsening over time with weight gain. She notes getting out of breath sooner with activity than she used to. This has not gotten worse recently. EKG-Normal sinus rhythm. Molly Archer denies orthopnea.  Hypertension Molly Archer is a 38 y.o. female with hypertension. Molly Archer's blood pressure is elevated today. She denies chest pain, chest pressure, or headaches. She has a prescription for amlodipine, but she is not taking it. She is working weight loss to help control her blood pressure with the goal of decreasing her risk of heart attack and stroke. Molly Archer's blood pressure is not currently controlled.  Pre-Diabetes Tacori has a diagnosis of pre-diabetes based on her elevated Hgb A1c and was informed this puts her at greater risk of developing diabetes. She notes carbohydrate cravings, but for snacks. She reports having pre-diabetes for at least 15 years. She continues to work on diet and exercise to decrease risk of diabetes.  At risk for diabetes Molly Archer is at higher than average risk for developing diabetes due to her obesity and pre-diabetes. She currently denies polyuria or polydipsia.  Depression Screen [name]'s Food and Mood (modified PHQ-9) score was  Depression screen [facility] 2/9 08/11/2018  Decreased Interest 0  Down, Depressed, Hopeless 0  PHQ - 2 Score 0  Altered sleeping 0  Tired, decreased energy 1  Change in appetite 1  Feeling bad or failure about yourself  0  Trouble concentrating 0  Moving slowly or fidgety/restless  0  Suicidal thoughts 0  PHQ-9 Score 2  Difficult doing work/chores Not difficult at all    ASSESSMENT AND PLAN:  Other fatigue - Plan: EKG 12-Lead, Vitamin B12, CBC With Differential, Folate, T3, T4, free, TSH, VITAMIN D 25 Hydroxy (Vit-D Deficiency, Fractures)  Shortness of breath on  exertion - Plan: Lipid Panel With LDL/HDL Ratio  Essential hypertension  Prediabetes - Plan: Comprehensive metabolic panel, Hemoglobin A1c, Insulin, random  Depression screening  At risk for diabetes mellitus  Class 3 severe obesity with serious comorbidity and body mass index (BMI) of 50.0 to 59.9 in adult, unspecified obesity type (HCC)  PLAN:  Fatigue Ladonya was informed that her fatigue may be related to obesity, depression or many other causes. Labs will be ordered, and in the meanwhile [name] has agreed to work on diet, exercise and weight loss to help with fatigue. Proper sleep hygiene was discussed including the need for 7-8 hours of quality sleep each night. A sleep study was not ordered based on symptoms and Epworth score.  Dyspnea on exertion [name]'s shortness of breath appears to be obesity related and exercise induced. She has agreed to work on weight loss and gradually increase exercise to treat her exercise induced shortness of breath. If [name] follows our instructions and loses weight without improvement of her shortness of breath, we will plan to refer to pulmonology. We will monitor this condition regularly. [name] agrees to this plan.  Hypertension We discussed sodium restriction, working on healthy weight loss, and a regular exercise program as the means to achieve improved blood pressure control. [name] agreed with this plan and agreed to follow up as directed. We will continue to monitor her blood pressure as well as her progress with the above lifestyle modifications. She will continue her medications as prescribed and will watch for signs of hypotension as she continues her lifestyle modifications. EKG was done, we will check CMP and FLP today. [name] agrees to follow up with our clinic in 2 weeks.  Pre-Diabetes Molly Archer will continue to work on weight loss, exercise, and decreasing simple carbohydrates in her diet to help decrease the risk of diabetes. We  dicussed metformin including benefits and risks. She was informed that eating too many simple carbohydrates or too many calories at one sitting increases the likelihood of GI side effects. Tully declined metformin for now and a prescription was not written today. We will check Hgb A1c and insulin today. Liticia agrees to follow up with our clinic in 2 weeks as directed to monitor her progress.  Diabetes risk counselling Molly Archer was given extended (15 minutes) diabetes prevention counseling today. She is 38 y.o. female and has risk factors for diabetes including obesity and pre-diabetes. We discussed intensive lifestyle modifications today with an emphasis on weight loss as well as increasing exercise and decreasing simple carbohydrates in her diet.  Depression Screen Cohen had a mildly positive depression screening. Depression is commonly associated with obesity and often results in emotional eating behaviors. We will monitor this closely and work on CBT to help improve the non-hunger eating patterns. Referral to Psychology may be required if no improvement is seen as she continues in our clinic.  Obesity Molly Archer is currently in the action stage of change and her goal is to continue with weight loss efforts. I recommend Mackenzie begin the structured treatment plan as follows:  She has agreed to follow the Category 4 plan Molly Archer has been instructed to eventually work up to a goal of 150 minutes  of combined cardio and strengthening exercise per week for weight loss and overall health benefits. We discussed the following Behavioral Modification Strategies today: increasing lean protein intake, increasing vegetables and work on meal planning and easy cooking plans, and planning for success   She was informed of the importance of frequent follow up visits to maximize her success with intensive lifestyle modifications for her multiple health conditions. She was informed we would discuss her lab results  at her next visit unless there is a critical issue that needs to be addressed sooner. Molly Archer agreed to keep her next visit at the agreed upon time to discuss these results.  ALLERGIES: Allergies  Allergen Reactions  . Codeine Itching  . Peanut-Containing Drug Products Itching  . Penicillins Hives    Has patient had a PCN reaction causing immediate rash, facial/tongue/throat swelling, SOB or lightheadedness with hypotension: No Has patient had a PCN reaction causing severe rash involving mucus membranes or skin necrosis: No Has patient had a PCN reaction that required hospitalization No Has patient had a PCN reaction occurring within the last 10 years: No If all of the above answers are "NO", then may proceed with Cephalosporin use.   . Shellfish Allergy Itching    MEDICATIONS: Current Outpatient Medications on File Prior to Visit  Medication Sig Dispense Refill  . albuterol (PROVENTIL HFA;VENTOLIN HFA) 108 (90 Base) MCG/ACT inhaler Inhale 2 puffs into the lungs every 4 (four) hours as needed for wheezing or shortness of breath (cough, shortness of breath or wheezing.). 1 Inhaler 1  . amLODipine (NORVASC) 5 MG tablet Take 5 mg by mouth daily.    . diclofenac (VOLTAREN) 75 MG EC tablet Take 1 tablet (75 mg total) by mouth 2 (two) times daily with a meal. 60 tablet 0  . norethindrone (AYGESTIN) 5 MG tablet Take 2 tablets (10 mg total) by mouth daily. 60 tablet 3  . oxyCODONE-acetaminophen (PERCOCET/ROXICET) 5-325 MG tablet Take 1 tablet by mouth every 6 (six) hours as needed for moderate pain. 10 tablet 0  . triamcinolone cream (KENALOG) 0.1 % Apply 1 application topically 2 (two) times daily. 30 g 0   No current facility-administered medications on file prior to visit.     PAST MEDICAL HISTORY: Past Medical History:  Diagnosis Date  . Anemia   . Asthma    only uses inhaler in fall and spring  . Constipation   . GERD (gastroesophageal reflux disease)    diet controlled, occ. uses  omeprazole  . Headache    otc med prn  . History of kidney stones   . Hx of Bell's palsy June 2013  . Hypertension    currently no meds  . Menometrorrhagia   . Prediabetes   . Psoriasis   . Shortness of breath dyspnea    with exercise/exertion  . Vitamin D deficiency     PAST SURGICAL HISTORY: Past Surgical History:  Procedure Laterality Date  . COMBINED HYSTEROSCOPY DIAGNOSTIC / D&C N/A 2011   Benign secretory endometrium  . CYSTOSCOPY WITH STENT PLACEMENT Left 10/19/2014   Procedure: CYSTOSCOPY WITH STENT PLACEMENT;  Surgeon: Ardis Hughs, MD;  Location: [facility];  Service: Urology;  Laterality: Left;  . DILATION AND CURETTAGE OF UTERUS  10/2003   Polyp, mild atypia, simple and complex hyperplasia,  . DILATION AND CURETTAGE OF UTERUS  11/2003   Simple and complex hyperplasia  . DILATION AND CURETTAGE OF UTERUS  2011  . DILATION AND CURETTAGE OF UTERUS N/A 12/14/2014   Procedure:  DILATATION AND CURETTAGE;  Surgeon: Mora Bellman, MD;  Location: [facility];  Service: Gynecology;  Laterality: N/A;  . HOLMIUM LASER APPLICATION Left 2/42/6834   Procedure: HOLMIUM LASER APPLICATION;  Surgeon: Ardis Hughs, MD;  Location: [facility];  Service: Urology;  Laterality: Left;  . HYSTEROSCOPY W/D&C N/A 07/21/2018   Procedure: DILATATION AND CURETTAGE /HYSTEROSCOPY;  Surgeon: Woodroe Mode, MD;  Location: [facility];  Service: Gynecology;  Laterality: N/A;  . INTRAUTERINE DEVICE (IUD) INSERTION N/A 12/14/2014   Procedure: INTRAUTERINE DEVICE (IUD) INSERTION;  Surgeon: Mora Bellman, MD;  Location: [facility];  Service: Gynecology;  Laterality: N/A;  . STONE EXTRACTION WITH BASKET Left 10/19/2014   Procedure: STONE EXTRACTION WITH BASKET;  Surgeon: Ardis Hughs, MD;  Location: [facility];  Service: Urology;  Laterality: Left;  . WISDOM TOOTH EXTRACTION      SOCIAL HISTORY: Social History   Tobacco Use  . Smoking status: Never Smoker  . Smokeless tobacco: Never Used  Substance Use Topics    . Alcohol use: No  . Drug use: No    FAMILY HISTORY: Family History  Problem Relation Age of Onset  . Hypertension Other   . Diabetes Other   . Cancer Other   . Hypertension Mother   . Hyperlipidemia Mother   . Hypertension Father     ROS: Review of Systems  Constitutional: Positive for malaise/fatigue. Negative for weight loss.       + Trouble sleeping  HENT:       + Nasal stuffiness  Respiratory: Positive for cough and shortness of breath (with exertion).   Cardiovascular: Negative for chest pain and orthopnea.       Negative chest pressure  Genitourinary: Negative for frequency.  Skin:       + Hair or nail changes  Neurological: Negative for headaches.  Endo/Heme/Allergies: Negative for polydipsia.    PHYSICAL EXAM: Blood pressure (!) 153/86, pulse 82, temperature 98.2 F (36.8 C), temperature source Oral, height 5\' 4"  (1.626 m), weight (!) 322 lb (146.1 kg), last menstrual period 07/25/2018, SpO2 99 %. Body mass index is 55.27 kg/m. Physical Exam Vitals signs reviewed.  Constitutional:      Appearance: Normal appearance. She is obese.  HENT:     Head: Normocephalic and atraumatic.     Nose: Nose normal.  Eyes:     General: No scleral icterus.    Extraocular Movements: Extraocular movements intact.  Neck:     Musculoskeletal: Normal range of motion and neck supple.     Comments: No thyromegaly present Cardiovascular:     Rate and Rhythm: Normal rate and regular rhythm.     Pulses: Normal pulses.     Heart sounds: Normal heart sounds.  Pulmonary:     Effort: Pulmonary effort is normal. No respiratory distress.     Breath sounds: Normal breath sounds.  Abdominal:     Palpations: Abdomen is soft.     Tenderness: There is no abdominal tenderness.     Comments: + Obesity  Musculoskeletal: Normal range of motion.     Right lower leg: No edema.     Left lower leg: No edema.  Skin:    General: Skin is warm and dry.  Neurological:     Mental Status: She  is alert and oriented to person, place, and time.     Coordination: Coordination normal.  Psychiatric:        Mood and Affect: Mood normal.        Behavior: Behavior  normal.     RECENT LABS AND TESTS: BMET    Component Value Date/Time   NA 138 07/21/2018 0555   NA 143 04/17/2018 1354   K 3.8 07/21/2018 0555   CL 105 07/21/2018 0555   CO2 23 07/21/2018 0555   GLUCOSE 109 (H) 07/21/2018 0555   BUN 11 07/21/2018 0555   BUN 10 04/17/2018 1354   CREATININE 0.60 07/21/2018 0555   CALCIUM 8.6 (L) 07/21/2018 0555   GFRNONAA >60 07/21/2018 0555   GFRAA >60 07/21/2018 0555   Lab Results  Component Value Date   HGBA1C 5.7 (H) 04/17/2018   No results found for: INSULIN CBC    Component Value Date/Time   WBC 7.7 07/24/2018 0238   RBC 4.20 07/24/2018 0238   HGB 8.5 (L) 07/24/2018 0238   HGB 8.8 (L) 06/09/2018 1743   HCT 30.8 (L) 07/24/2018 0238   HCT 29.9 (L) 06/09/2018 1743   PLT 369 07/24/2018 0238   PLT 451 (H) 06/09/2018 1743   MCV 73.3 (L) 07/24/2018 0238   MCV 72 (L) 06/09/2018 1743   MCH 20.2 (L) 07/24/2018 0238   MCHC 27.6 (L) 07/24/2018 0238   RDW 15.7 (H) 07/24/2018 0238   RDW 14.5 06/09/2018 1743   LYMPHSABS 2.2 07/24/2018 0238   LYMPHSABS 1.8 06/09/2018 1743   MONOABS 0.6 07/24/2018 0238   EOSABS 0.2 07/24/2018 0238   EOSABS 0.3 06/09/2018 1743   BASOSABS 0.0 07/24/2018 0238   BASOSABS 0.0 06/09/2018 1743   Iron/TIBC/Ferritin/ %Sat No results found for: IRON, TIBC, FERRITIN, IRONPCTSAT Lipid Panel     Component Value Date/Time   CHOL 173 04/17/2018 1354   TRIG 158 (H) 04/17/2018 1354   HDL 41 04/17/2018 1354   CHOLHDL 4.2 04/17/2018 1354   CHOLHDL 5.3 Ratio 03/25/2009 2040   VLDL 21 03/25/2009 2040   LDLCALC 100 (H) 04/17/2018 1354   Hepatic Function Panel     Component Value Date/Time   PROT 7.3 07/21/2018 0555   PROT 7.2 04/17/2018 1354   ALBUMIN 3.8 07/21/2018 0555   ALBUMIN 4.5 04/17/2018 1354   AST 15 07/21/2018 0555   ALT 15 07/21/2018  0555   ALKPHOS 32 (L) 07/21/2018 0555   BILITOT 0.9 07/21/2018 0555   BILITOT 0.2 04/17/2018 1354      Component Value Date/Time   TSH 0.615 07/20/2018 2307   TSH 2.300 04/17/2018 1354   TSH 1.403 10/21/2013 1634   TSH 1.567 03/10/2010 1959    ECG  shows NSR with a rate of 86 BPM INDIRECT CALORIMETER done today shows a VO2 of 442 and a REE of 3073.  Her calculated basal metabolic rate is 2774 thus her basal metabolic rate is better than expected.       OBESITY BEHAVIORAL INTERVENTION VISIT  Today's visit was # 1   Starting weight: 322 lbs Starting date: 08/11/2018 Today's weight : 322 lbs  Today's date: 08/11/2018 Total lbs lost to date: 0    ASK: We discussed the diagnosis of obesity with [name] today and Shanica agreed to give [place] permission to discuss obesity behavioral modification therapy today.  ASSESS: Galia has the diagnosis of obesity and her BMI today is 55.24 Addley is in the action stage of change   ADVISE: Natalyah was educated on the multiple health risks of obesity as well as the benefit of weight loss to improve her health. She was advised of the need for long term treatment and the importance of lifestyle modifications to improve her current  health and to decrease her risk of future health problems.  AGREE: Multiple dietary modification options and treatment options were discussed and  Scout agreed to follow the recommendations documented in the above note.  ARRANGE: Kaleiyah was educated on the importance of frequent visits to treat obesity as outlined per CMS and USPSTF guidelines and agreed to schedule her next follow up appointment today.  I, Trixie Dredge, am acting as transcriptionist for Ilene Qua, MD  I have reviewed the above documentation for accuracy and completeness, and I agree with the above. - Ilene Qua, MD

## 2018-08-12 LAB — COMPREHENSIVE METABOLIC PANEL
ALT: 23 IU/L (ref 0–32)
AST: 12 IU/L (ref 0–40)
Albumin/Globulin Ratio: 2 (ref 1.2–2.2)
Albumin: 4.7 g/dL (ref 3.5–5.5)
Alkaline Phosphatase: 47 IU/L (ref 39–117)
BUN / CREAT RATIO: 17 (ref 9–23)
BUN: 10 mg/dL (ref 6–20)
Bilirubin Total: 0.3 mg/dL (ref 0.0–1.2)
CO2: 21 mmol/L (ref 20–29)
CREATININE: 0.6 mg/dL (ref 0.57–1.00)
Calcium: 9.3 mg/dL (ref 8.7–10.2)
Chloride: 104 mmol/L (ref 96–106)
GFR calc Af Amer: 136 mL/min/{1.73_m2} (ref >59)
GFR calc non Af Amer: 118 mL/min/{1.73_m2} (ref >59)
Globulin, Total: 2.3 g/dL (ref 1.5–4.5)
Glucose: 78 mg/dL (ref 65–99)
Potassium: 4.4 mmol/L (ref 3.5–5.2)
Sodium: 141 mmol/L (ref 134–144)
Total Protein: 7 g/dL (ref 6.0–8.5)

## 2018-08-12 LAB — CBC WITH DIFFERENTIAL
Basophils Absolute: 0.1 10*3/uL (ref 0.0–0.2)
Basos: 1 %
EOS (ABSOLUTE): 0.4 10*3/uL (ref 0.0–0.4)
Eos: 5 %
HEMOGLOBIN: 8.5 g/dL — AB (ref 11.1–15.9)
Hematocrit: 29.5 % — ABNORMAL LOW (ref 34.0–46.6)
Immature Grans (Abs): 0 10*3/uL (ref 0.0–0.1)
Immature Granulocytes: 0 %
Lymphocytes Absolute: 1.8 10*3/uL (ref 0.7–3.1)
Lymphs: 26 %
MCH: 20 pg — ABNORMAL LOW (ref 26.6–33.0)
MCHC: 28.8 g/dL — ABNORMAL LOW (ref 31.5–35.7)
MCV: 70 fL — ABNORMAL LOW (ref 79–97)
MONOCYTES: 7 %
Monocytes Absolute: 0.5 10*3/uL (ref 0.1–0.9)
Neutrophils Absolute: 4.3 10*3/uL (ref 1.4–7.0)
Neutrophils: 61 %
RBC: 4.24 x10E6/uL (ref 3.77–5.28)
RDW: 15.9 % — ABNORMAL HIGH (ref 11.7–15.4)
WBC: 7 10*3/uL (ref 3.4–10.8)

## 2018-08-12 LAB — HEMOGLOBIN A1C
Est. average glucose Bld gHb Est-mCnc: 128 mg/dL
HEMOGLOBIN A1C: 6.1 % — AB (ref 4.8–5.6)

## 2018-08-12 LAB — LIPID PANEL WITH LDL/HDL RATIO
Cholesterol, Total: 155 mg/dL (ref 100–199)
HDL: 29 mg/dL — ABNORMAL LOW (ref >39)
LDL CALC: 109 mg/dL — AB (ref 0–99)
LDl/HDL Ratio: 3.8 ratio — ABNORMAL HIGH (ref 0.0–3.2)
Triglycerides: 86 mg/dL (ref 0–149)
VLDL Cholesterol Cal: 17 mg/dL (ref 5–40)

## 2018-08-12 LAB — T4, FREE: Free T4: 1.04 ng/dL (ref 0.82–1.77)

## 2018-08-12 LAB — TSH: TSH: 1.2 u[IU]/mL (ref 0.450–4.500)

## 2018-08-12 LAB — FOLATE: Folate: 7.8 ng/mL (ref >3.0)

## 2018-08-12 LAB — INSULIN, RANDOM: INSULIN: 38.4 u[IU]/mL — ABNORMAL HIGH (ref 2.6–24.9)

## 2018-08-12 LAB — T3: T3, Total: 116 ng/dL (ref 71–180)

## 2018-08-12 LAB — VITAMIN D 25 HYDROXY (VIT D DEFICIENCY, FRACTURES): Vit D, 25-Hydroxy: 7.6 ng/mL — ABNORMAL LOW (ref 30.0–100.0)

## 2018-08-12 LAB — VITAMIN B12: Vitamin B-12: 322 pg/mL (ref 232–1245)

## 2018-08-14 ENCOUNTER — Ambulatory Visit (INDEPENDENT_AMBULATORY_CARE_PROVIDER_SITE_OTHER): Payer: BLUE CROSS/BLUE SHIELD | Admitting: Family Medicine

## 2018-08-18 ENCOUNTER — Ambulatory Visit (INDEPENDENT_AMBULATORY_CARE_PROVIDER_SITE_OTHER): Payer: BLUE CROSS/BLUE SHIELD | Admitting: Family Medicine

## 2018-08-25 ENCOUNTER — Ambulatory Visit (INDEPENDENT_AMBULATORY_CARE_PROVIDER_SITE_OTHER): Payer: BLUE CROSS/BLUE SHIELD | Admitting: Family Medicine

## 2018-08-25 ENCOUNTER — Encounter (INDEPENDENT_AMBULATORY_CARE_PROVIDER_SITE_OTHER): Payer: Self-pay | Admitting: Family Medicine

## 2018-08-25 VITALS — BP 163/91 | HR 84 | Temp 98.5°F | Ht 64.0 in | Wt 318.0 lb

## 2018-08-25 DIAGNOSIS — Z6841 Body Mass Index (BMI) 40.0 and over, adult: Secondary | ICD-10-CM

## 2018-08-25 DIAGNOSIS — D508 Other iron deficiency anemias: Secondary | ICD-10-CM | POA: Diagnosis not present

## 2018-08-25 DIAGNOSIS — I10 Essential (primary) hypertension: Secondary | ICD-10-CM

## 2018-08-25 DIAGNOSIS — Z9189 Other specified personal risk factors, not elsewhere classified: Secondary | ICD-10-CM

## 2018-08-25 DIAGNOSIS — R7303 Prediabetes: Secondary | ICD-10-CM

## 2018-08-25 DIAGNOSIS — E559 Vitamin D deficiency, unspecified: Secondary | ICD-10-CM

## 2018-08-25 MED ORDER — VITAMIN D (ERGOCALCIFEROL) 1.25 MG (50000 UNIT) PO CAPS: 50000.0000 [IU] | ORAL_CAPSULE | ORAL | 0 refills | Status: DC

## 2018-08-25 MED ORDER — AMLODIPINE BESYLATE 10 MG PO TABS: 10.0000 mg | ORAL_TABLET | Freq: Every day | ORAL | 0 refills | Status: DC

## 2018-08-25 MED ORDER — FERROUS GLUCONATE 324 (38 FE) MG PO TABS: 324.0000 mg | ORAL_TABLET | Freq: Two times a day (BID) | ORAL | 0 refills | Status: DC

## 2018-08-26 NOTE — Progress Notes (Signed)
**Note De-Identified Molly Archer Obfuscation** Office: [phone]  /  Fax: [phone]   HPI:   Chief Complaint: OBESITY Molly Archer is here to discuss her progress with her obesity treatment plan. She is on the Category 4 plan and is following her eating plan approximately 50 % of the time. She states she is exercising 0 minutes 0 times per week. Molly Archer is struggling with dinner, time and prep. She doesn't normally cook this way. She found dinner portion to be excessive. She is snacking and going over 300 calories by approximately 20 calories.  Her weight is (!) 318 lb (144.2 kg) today and has had a weight loss of 4 pounds over a period of 2 weeks since her last visit. She has lost 4 lbs since starting treatment with [place].  Hypertension Molly Archer is a 38 y.o. female with hypertension. Molly Archer's blood pressure is significantly elevated. She denies chest pain or chest pressure, and notes headache this morning. She reports she has had a significant headache since last night. She is working on weight loss to help control her blood pressure with the goal of decreasing her risk of heart attack and stroke. Molly Archer's blood pressure is not currently controlled.  Vitamin D Deficiency Molly Archer has a diagnosis of vitamin D deficiency. She is not on Vit D supplement and denies nausea or vomiting.   Iron Deficiency Anemia Molly Archer has a diagnosis of anemia. Her MCV is 70 and H/H is stable and low. She is not on iron supplementation.    Pre-Diabetes Molly Archer has a diagnosis of pre-diabetes based on her elevated Hgb A1c and this not a new diagnosis. She was informed this puts her at greater risk of developing diabetes. Her insulin is of 38.4. She is not taking metformin currently and continues to work on diet and exercise to decrease risk of diabetes.   At risk for diabetes Molly Archer is at higher than average risk for developing diabetes due to her obesity and pre-diabetes. She currently denies polyuria or polydipsia.  ASSESSMENT AND  PLAN:  Essential hypertension - Plan: amLODipine (NORVASC) 10 MG tablet  Vitamin D deficiency - Plan: Vitamin D, Ergocalciferol, (DRISDOL) 1.25 MG (50000 UT) CAPS capsule  Other iron deficiency anemia - Plan: ferrous gluconate (FERGON) 324 MG tablet  Prediabetes  At risk for diabetes mellitus  Class 3 severe obesity with serious comorbidity and body mass index (BMI) of 50.0 to 59.9 in adult, unspecified obesity type ([place])  PLAN:  Hypertension We discussed sodium restriction, working on healthy weight loss, and a regular exercise program as the means to achieve improved blood pressure control. Molly Archer agreed with this plan and agreed to follow up as directed. We will continue to monitor her blood pressure as well as her progress with the above lifestyle modifications. Molly Archer agrees to increase amlodipine to 10 mg PO daily #30 with no refills (needs 10 mg tablets). She will watch for signs of hypotension as she continues her lifestyle modifications. Molly Archer agrees to follow up with our clinic in 2 weeks.  Vitamin D Deficiency Molly Archer was informed that low vitamin D levels contributes to fatigue and are associated with obesity, breast, and colon cancer. Molly Archer agrees to start prescription Vit D @50 ,000 IU every week #4 with no refills. She will follow up for routine testing of vitamin D, at least 2-3 times per year. She was informed of the risk of over-replacement of vitamin D and agrees to not increase her dose unless she discusses this with [place] first. Molly Archer agrees to follow up with our  clinic in 2 weeks.  Iron Deficiency Anemia The diagnosis of Iron deficiency anemia was discussed with Molly Archer and was explained in detail. She was given suggestions of iron rich foods. Molly Archer agrees to start ferrous gluconate 28 mg tablet PO BID #60 with no refills. Molly Archer agrees to follow up with our clinic in 2 weeks.  Pre-Diabetes Molly Archer will continue to work on weight loss, exercise, and decreasing  simple carbohydrates in her diet to help decrease the risk of diabetes. We dicussed metformin including benefits and risks. She was informed that eating too many simple carbohydrates or too many calories at one sitting increases the likelihood of GI side effects. Molly Archer would like to defer starting metformin until next labs and a prescription was not written today. We will repeat labs in 3 months. Molly Archer agrees to follow up with our clinic in 2 weeks as directed to monitor her progress.  Diabetes risk counselling Molly Archer was given extended (30 minutes) diabetes prevention counseling today. She is 38 y.o. female and has risk factors for diabetes including obesity and pre-diabetes. We discussed intensive lifestyle modifications today with an emphasis on weight loss as well as increasing exercise and decreasing simple carbohydrates in her diet.  Obesity Molly Archer is currently in the action stage of change. As such, her goal is to continue with weight loss efforts She has agreed to follow the Category 4 plan Molly Archer has been instructed to work up to a goal of 150 minutes of combined cardio and strengthening exercise per week for weight loss and overall health benefits. We discussed the following Behavioral Modification Strategies today: increasing lean protein intake, increasing vegetables, work on meal planning and easy cooking plans, better snacking choices, and planning for success   Molly Archer has agreed to follow up with our clinic in 2 weeks. She was informed of the importance of frequent follow up visits to maximize her success with intensive lifestyle modifications for her multiple health conditions.  ALLERGIES: Allergies  Allergen Reactions  . Codeine Itching  . Peanut-Containing Drug Products Itching  . Penicillins Hives    Has patient had a PCN reaction causing immediate rash, facial/tongue/throat swelling, SOB or lightheadedness with hypotension: No Has patient had a PCN reaction causing  severe rash involving mucus membranes or skin necrosis: No Has patient had a PCN reaction that required hospitalization No Has patient had a PCN reaction occurring within the last 10 years: No If all of the above answers are "NO", then may proceed with Cephalosporin use.   . Shellfish Allergy Itching    MEDICATIONS: Current Outpatient Medications on File Prior to Visit  Medication Sig Dispense Refill  . albuterol (PROVENTIL HFA;VENTOLIN HFA) 108 (90 Base) MCG/ACT inhaler Inhale 2 puffs into the lungs every 4 (four) hours as needed for wheezing or shortness of breath (cough, shortness of breath or wheezing.). 1 Inhaler 1  . diclofenac (VOLTAREN) 75 MG EC tablet Take 1 tablet (75 mg total) by mouth 2 (two) times daily with a meal. 60 tablet 0  . norethindrone (AYGESTIN) 5 MG tablet Take 2 tablets (10 mg total) by mouth daily. 60 tablet 3  . oxyCODONE-acetaminophen (PERCOCET/ROXICET) 5-325 MG tablet Take 1 tablet by mouth every 6 (six) hours as needed for moderate pain. 10 tablet 0  . triamcinolone cream (KENALOG) 0.1 % Apply 1 application topically 2 (two) times daily. 30 g 0   No current facility-administered medications on file prior to visit.     PAST MEDICAL HISTORY: Past Medical History:  Diagnosis Date  .  Anemia   . Asthma    only uses inhaler in fall and spring  . Constipation   . GERD (gastroesophageal reflux disease)    diet controlled, occ. uses omeprazole  . Headache    otc med prn  . History of kidney stones   . Hx of Bell's palsy June 2013  . Hypertension    currently no meds  . Menometrorrhagia   . Prediabetes   . Psoriasis   . Shortness of breath dyspnea    with exercise/exertion  . Vitamin D deficiency     PAST SURGICAL HISTORY: Past Surgical History:  Procedure Laterality Date  . COMBINED HYSTEROSCOPY DIAGNOSTIC / D&C N/A 2011   Benign secretory endometrium  . CYSTOSCOPY WITH STENT PLACEMENT Left 10/19/2014   Procedure: CYSTOSCOPY WITH STENT PLACEMENT;   Surgeon: Ardis Hughs, MD;  Location: [facility];  Service: Urology;  Laterality: Left;  . DILATION AND CURETTAGE OF UTERUS  10/2003   Polyp, mild atypia, simple and complex hyperplasia,  . DILATION AND CURETTAGE OF UTERUS  11/2003   Simple and complex hyperplasia  . DILATION AND CURETTAGE OF UTERUS  2011  . DILATION AND CURETTAGE OF UTERUS N/A 12/14/2014   Procedure: DILATATION AND CURETTAGE;  Surgeon: Mora Bellman, MD;  Location: [facility];  Service: Gynecology;  Laterality: N/A;  . HOLMIUM LASER APPLICATION Left 4/54/0981   Procedure: HOLMIUM LASER APPLICATION;  Surgeon: Ardis Hughs, MD;  Location: [facility];  Service: Urology;  Laterality: Left;  . HYSTEROSCOPY W/D&C N/A 07/21/2018   Procedure: DILATATION AND CURETTAGE /HYSTEROSCOPY;  Surgeon: Woodroe Mode, MD;  Location: [facility];  Service: Gynecology;  Laterality: N/A;  . INTRAUTERINE DEVICE (IUD) INSERTION N/A 12/14/2014   Procedure: INTRAUTERINE DEVICE (IUD) INSERTION;  Surgeon: Mora Bellman, MD;  Location: [facility];  Service: Gynecology;  Laterality: N/A;  . STONE EXTRACTION WITH BASKET Left 10/19/2014   Procedure: STONE EXTRACTION WITH BASKET;  Surgeon: Ardis Hughs, MD;  Location: [facility];  Service: Urology;  Laterality: Left;  . WISDOM TOOTH EXTRACTION      SOCIAL HISTORY: Social History   Tobacco Use  . Smoking status: Never Smoker  . Smokeless tobacco: Never Used  Substance Use Topics  . Alcohol use: No  . Drug use: No    FAMILY HISTORY: Family History  Problem Relation Age of Onset  . Hypertension Other   . Diabetes Other   . Cancer Other   . Hypertension Mother   . Hyperlipidemia Mother   . Hypertension Father     ROS: Review of Systems  Constitutional: Positive for weight loss.  Cardiovascular: Negative for chest pain.       Negative chest pressure  Gastrointestinal: Negative for nausea and vomiting.  Genitourinary: Negative for frequency.  Neurological: Positive for headaches.    Endo/Heme/Allergies: Negative for polydipsia.    PHYSICAL EXAM: Blood pressure (!) 163/91, pulse 84, temperature 98.5 F (36.9 C), temperature source Oral, height 5\' 4"  (1.626 m), weight (!) 318 lb (144.2 kg), SpO2 100 %. Body mass index is 54.58 kg/m. Physical Exam Vitals signs reviewed.  Constitutional:      Appearance: Normal appearance. She is obese.  Cardiovascular:     Rate and Rhythm: Normal rate.     Pulses: Normal pulses.  Pulmonary:     Effort: Pulmonary effort is normal.     Breath sounds: Normal breath sounds.  Musculoskeletal: Normal range of motion.  Skin:    General: Skin is warm and dry.  Neurological:  Mental Status: She is alert and oriented to person, place, and time.  Psychiatric:        Mood and Affect: Mood normal.        Behavior: Behavior normal.     RECENT LABS AND TESTS: BMET    Component Value Date/Time   NA 141 08/11/2018 1320   K 4.4 08/11/2018 1320   CL 104 08/11/2018 1320   CO2 21 08/11/2018 1320   GLUCOSE 78 08/11/2018 1320   GLUCOSE 109 (H) 07/21/2018 0555   BUN 10 08/11/2018 1320   CREATININE 0.60 08/11/2018 1320   CALCIUM 9.3 08/11/2018 1320   GFRNONAA 118 08/11/2018 1320   GFRAA 136 08/11/2018 1320   Lab Results  Component Value Date   HGBA1C 6.1 (H) 08/11/2018   HGBA1C 5.7 (H) 04/17/2018   Lab Results  Component Value Date   INSULIN 38.4 (H) 08/11/2018   CBC    Component Value Date/Time   WBC 7.0 08/11/2018 1320   WBC 7.7 07/24/2018 0238   RBC 4.24 08/11/2018 1320   RBC 4.20 07/24/2018 0238   HGB 8.5 (L) 08/11/2018 1320   HCT 29.5 (L) 08/11/2018 1320   PLT 369 07/24/2018 0238   PLT 451 (H) 06/09/2018 1743   MCV 70 (L) 08/11/2018 1320   MCH 20.0 (L) 08/11/2018 1320   MCH 20.2 (L) 07/24/2018 0238   MCHC 28.8 (L) 08/11/2018 1320   MCHC 27.6 (L) 07/24/2018 0238   RDW 15.9 (H) 08/11/2018 1320   LYMPHSABS 1.8 08/11/2018 1320   MONOABS 0.6 07/24/2018 0238   EOSABS 0.4 08/11/2018 1320   BASOSABS 0.1 08/11/2018  1320   Iron/TIBC/Ferritin/ %Sat No results found for: IRON, TIBC, FERRITIN, IRONPCTSAT Lipid Panel     Component Value Date/Time   CHOL 155 08/11/2018 1320   TRIG 86 08/11/2018 1320   HDL 29 (L) 08/11/2018 1320   CHOLHDL 4.2 04/17/2018 1354   CHOLHDL 5.3 Ratio 03/25/2009 2040   VLDL 21 03/25/2009 2040   LDLCALC 109 (H) 08/11/2018 1320   Hepatic Function Panel     Component Value Date/Time   PROT 7.0 08/11/2018 1320   ALBUMIN 4.7 08/11/2018 1320   AST 12 08/11/2018 1320   ALT 23 08/11/2018 1320   ALKPHOS 47 08/11/2018 1320   BILITOT 0.3 08/11/2018 1320      Component Value Date/Time   TSH 1.200 08/11/2018 1320   TSH 0.615 07/20/2018 2307   TSH 2.300 04/17/2018 1354   TSH 1.403 10/21/2013 1634      OBESITY BEHAVIORAL INTERVENTION VISIT  Today's visit was # 2   Starting weight: 322 lbs Starting date: 08/11/2018 Today's weight : 318 lbs  Today's date: 08/25/2018 Total lbs lost to date: 4    ASK: We discussed the diagnosis of obesity with Molly Archer today and Molly Archer agreed to give [place] permission to discuss obesity behavioral modification therapy today.  ASSESS: Molly Archer has the diagnosis of obesity and her BMI today is 54.56 Molly Archer is in the action stage of change   ADVISE: Molly Archer was educated on the multiple health risks of obesity as well as the benefit of weight loss to improve her health. She was advised of the need for long term treatment and the importance of lifestyle modifications to improve her current health and to decrease her risk of future health problems.  AGREE: Multiple dietary modification options and treatment options were discussed and  Molly Archer agreed to follow the recommendations documented in the above note.  ARRANGE: Molly Archer was educated on  the importance of frequent visits to treat obesity as outlined per CMS and USPSTF guidelines and agreed to schedule her next follow up appointment today.  I, Trixie Dredge, am acting as  transcriptionist for Ilene Qua, MD  I have reviewed the above documentation for accuracy and completeness, and I agree with the above. - Ilene Qua, MD

## 2018-09-01 ENCOUNTER — Ambulatory Visit: Payer: BLUE CROSS/BLUE SHIELD

## 2018-09-09 ENCOUNTER — Ambulatory Visit (INDEPENDENT_AMBULATORY_CARE_PROVIDER_SITE_OTHER): Payer: BLUE CROSS/BLUE SHIELD | Admitting: Family Medicine

## 2018-09-09 ENCOUNTER — Encounter (INDEPENDENT_AMBULATORY_CARE_PROVIDER_SITE_OTHER): Payer: Self-pay | Admitting: Family Medicine

## 2018-09-09 VITALS — BP 129/78 | HR 71 | Temp 98.2°F | Ht 64.0 in | Wt 314.0 lb

## 2018-09-09 DIAGNOSIS — Z6841 Body Mass Index (BMI) 40.0 and over, adult: Secondary | ICD-10-CM | POA: Diagnosis not present

## 2018-09-09 DIAGNOSIS — I10 Essential (primary) hypertension: Secondary | ICD-10-CM | POA: Diagnosis not present

## 2018-09-09 DIAGNOSIS — E559 Vitamin D deficiency, unspecified: Secondary | ICD-10-CM

## 2018-09-10 ENCOUNTER — Other Ambulatory Visit: Payer: Self-pay

## 2018-09-10 NOTE — Progress Notes (Signed)
Office: [phone]  /  Fax: [phone]   HPI:   Chief Complaint: OBESITY Molly Archer is here to discuss her progress with her obesity treatment plan. She is on the Category 4 plan and is following her eating plan approximately 80 % of the time. She states she is walking for 20 minutes 3 times per week. Molly Archer has started a new job so she is struggling to get all the food in secondary to that. She is not getting all of the food secondary to not getting a lunch break. She is working on getting up to 10 oz for dinner. She is looking for other options for breakfast.  Her weight is (!) 314 lb (142.4 kg) today and has had a weight loss of 4 pounds over a period of 2 weeks since her last visit. She has lost 8 lbs since starting treatment with [place].  Hypertension Molly Archer is a 38 y.o. female with hypertension. Sonyia's blood pressure is controlled today. She denies chest pain, chest pressure, or headaches. She is working on weight loss to help control her blood pressure with the goal of decreasing her risk of heart attack and stroke.   Vitamin D Deficiency Molly Archer has a diagnosis of vitamin D deficiency. She is currently taking prescription Vit D. She notes fatigue and denies nausea, vomiting or muscle weakness.  ASSESSMENT AND PLAN:  Essential hypertension  Vitamin D deficiency  Class 3 severe obesity with serious comorbidity and body mass index (BMI) of 50.0 to 59.9 in adult, unspecified obesity type ([place])  PLAN:  Hypertension We discussed sodium restriction, working on healthy weight loss, and a regular exercise program as the means to achieve improved blood pressure control. Molly Archer agreed with this plan and agreed to follow up as directed. We will continue to monitor her blood pressure as well as her progress with the above lifestyle modifications. Shawanda agrees to continue taking amlodipine at 10 mg, and she will watch for signs of hypotension as she continues her lifestyle  modifications. Molly Archer agrees to follow up with our clinic in 2 weeks.  Vitamin D Deficiency Molly Archer was informed that low vitamin D levels contributes to fatigue and are associated with obesity, breast, and colon cancer. Molly Archer agrees to continue taking prescription Vit D @50 ,000 IU every week and will follow up for routine testing of vitamin D, at least 2-3 times per year. She was informed of the risk of over-replacement of vitamin D and agrees to not increase her dose unless she discusses this with [place] first. Molly Archer agrees to follow up with our clinic in 2 weeks.  I spent > than 50% of the 15 minute visit on counseling as documented in the note.  Obesity Molly Archer is currently in the action stage of change. As such, her goal is to continue with weight loss efforts She has agreed to follow the Category 4 plan Molly Archer has been instructed to work up to a goal of 150 minutes of combined cardio and strengthening exercise per week for weight loss and overall health benefits. We discussed the following Behavioral Modification Strategies today: increasing lean protein intake, increasing vegetables and work on meal planning and easy cooking plans, and planning for success   Molly Archer has agreed to follow up with our clinic in 2 weeks. She was informed of the importance of frequent follow up visits to maximize her success with intensive lifestyle modifications for her multiple health conditions.  ALLERGIES: Allergies  Allergen Reactions  . Codeine Itching  . Peanut-Containing  Drug Products Itching  . Penicillins Hives    Has patient had a PCN reaction causing immediate rash, facial/tongue/throat swelling, SOB or lightheadedness with hypotension: No Has patient had a PCN reaction causing severe rash involving mucus membranes or skin necrosis: No Has patient had a PCN reaction that required hospitalization No Has patient had a PCN reaction occurring within the last 10 years: No If all of the above  answers are "NO", then may proceed with Cephalosporin use.   . Shellfish Allergy Itching    MEDICATIONS: Current Outpatient Medications on File Prior to Visit  Medication Sig Dispense Refill  . albuterol (PROVENTIL HFA;VENTOLIN HFA) 108 (90 Base) MCG/ACT inhaler Inhale 2 puffs into the lungs every 4 (four) hours as needed for wheezing or shortness of breath (cough, shortness of breath or wheezing.). 1 Inhaler 1  . amLODipine (NORVASC) 10 MG tablet Take 1 tablet (10 mg total) by mouth daily. 30 tablet 0  . diclofenac (VOLTAREN) 75 MG EC tablet Take 1 tablet (75 mg total) by mouth 2 (two) times daily with a meal. 60 tablet 0  . ferrous gluconate (FERGON) 324 MG tablet Take 1 tablet (324 mg total) by mouth 2 (two) times daily with a meal. 60 tablet 0  . norethindrone (AYGESTIN) 5 MG tablet Take 2 tablets (10 mg total) by mouth daily. 60 tablet 3  . oxyCODONE-acetaminophen (PERCOCET/ROXICET) 5-325 MG tablet Take 1 tablet by mouth every 6 (six) hours as needed for moderate pain. 10 tablet 0  . triamcinolone cream (KENALOG) 0.1 % Apply 1 application topically 2 (two) times daily. 30 g 0  . Vitamin D, Ergocalciferol, (DRISDOL) 1.25 MG (50000 UT) CAPS capsule Take 1 capsule (50,000 Units total) by mouth every 7 (seven) days. 4 capsule 0   No current facility-administered medications on file prior to visit.     PAST MEDICAL HISTORY: Past Medical History:  Diagnosis Date  . Anemia   . Asthma    only uses inhaler in fall and spring  . Constipation   . GERD (gastroesophageal reflux disease)    diet controlled, occ. uses omeprazole  . Headache    otc med prn  . History of kidney stones   . Hx of Bell's palsy June 2013  . Hypertension    currently no meds  . Menometrorrhagia   . Prediabetes   . Psoriasis   . Shortness of breath dyspnea    with exercise/exertion  . Vitamin D deficiency     PAST SURGICAL HISTORY: Past Surgical History:  Procedure Laterality Date  . COMBINED HYSTEROSCOPY  DIAGNOSTIC / D&C N/A 2011   Benign secretory endometrium  . CYSTOSCOPY WITH STENT PLACEMENT Left 10/19/2014   Procedure: CYSTOSCOPY WITH STENT PLACEMENT;  Surgeon: Ardis Hughs, MD;  Location: [facility];  Service: Urology;  Laterality: Left;  . DILATION AND CURETTAGE OF UTERUS  10/2003   Polyp, mild atypia, simple and complex hyperplasia,  . DILATION AND CURETTAGE OF UTERUS  11/2003   Simple and complex hyperplasia  . DILATION AND CURETTAGE OF UTERUS  2011  . DILATION AND CURETTAGE OF UTERUS N/A 12/14/2014   Procedure: DILATATION AND CURETTAGE;  Surgeon: Mora Bellman, MD;  Location: [facility];  Service: Gynecology;  Laterality: N/A;  . HOLMIUM LASER APPLICATION Left 01/10/4314   Procedure: HOLMIUM LASER APPLICATION;  Surgeon: Ardis Hughs, MD;  Location: [facility];  Service: Urology;  Laterality: Left;  . HYSTEROSCOPY W/D&C N/A 07/21/2018   Procedure: DILATATION AND CURETTAGE /HYSTEROSCOPY;  Surgeon: Woodroe Mode,  MD;  Location: [facility];  Service: Gynecology;  Laterality: N/A;  . INTRAUTERINE DEVICE (IUD) INSERTION N/A 12/14/2014   Procedure: INTRAUTERINE DEVICE (IUD) INSERTION;  Surgeon: Mora Bellman, MD;  Location: [facility];  Service: Gynecology;  Laterality: N/A;  . STONE EXTRACTION WITH BASKET Left 10/19/2014   Procedure: STONE EXTRACTION WITH BASKET;  Surgeon: Ardis Hughs, MD;  Location: [facility];  Service: Urology;  Laterality: Left;  . WISDOM TOOTH EXTRACTION      SOCIAL HISTORY: Social History   Tobacco Use  . Smoking status: Never Smoker  . Smokeless tobacco: Never Used  Substance Use Topics  . Alcohol use: No  . Drug use: No    FAMILY HISTORY: Family History  Problem Relation Age of Onset  . Hypertension Other   . Diabetes Other   . Cancer Other   . Hypertension Mother   . Hyperlipidemia Mother   . Hypertension Father     ROS: Review of Systems  Constitutional: Positive for malaise/fatigue and weight loss.  Cardiovascular: Negative for chest pain.        Negative chest pressure  Gastrointestinal: Negative for nausea and vomiting.  Musculoskeletal:       Negative muscle weakness  Neurological: Negative for headaches.    PHYSICAL EXAM: Blood pressure 129/78, pulse 71, temperature 98.2 F (36.8 C), temperature source Oral, height 5\' 4"  (1.626 m), weight (!) 314 lb (142.4 kg), SpO2 99 %. Body mass index is 53.9 kg/m. Physical Exam Vitals signs reviewed.  Constitutional:      Appearance: Normal appearance. She is obese.  Cardiovascular:     Rate and Rhythm: Normal rate.     Pulses: Normal pulses.  Pulmonary:     Effort: Pulmonary effort is normal.     Breath sounds: Normal breath sounds.  Musculoskeletal: Normal range of motion.  Skin:    General: Skin is warm and dry.  Neurological:     Mental Status: She is alert and oriented to person, place, and time.  Psychiatric:        Mood and Affect: Mood normal.        Behavior: Behavior normal.     RECENT LABS AND TESTS: BMET    Component Value Date/Time   NA 141 08/11/2018 1320   K 4.4 08/11/2018 1320   CL 104 08/11/2018 1320   CO2 21 08/11/2018 1320   GLUCOSE 78 08/11/2018 1320   GLUCOSE 109 (H) 07/21/2018 0555   BUN 10 08/11/2018 1320   CREATININE 0.60 08/11/2018 1320   CALCIUM 9.3 08/11/2018 1320   GFRNONAA 118 08/11/2018 1320   GFRAA 136 08/11/2018 1320   Lab Results  Component Value Date   HGBA1C 6.1 (H) 08/11/2018   HGBA1C 5.7 (H) 04/17/2018   Lab Results  Component Value Date   INSULIN 38.4 (H) 08/11/2018   CBC    Component Value Date/Time   WBC 7.0 08/11/2018 1320   WBC 7.7 07/24/2018 0238   RBC 4.24 08/11/2018 1320   RBC 4.20 07/24/2018 0238   HGB 8.5 (L) 08/11/2018 1320   HCT 29.5 (L) 08/11/2018 1320   PLT 369 07/24/2018 0238   PLT 451 (H) 06/09/2018 1743   MCV 70 (L) 08/11/2018 1320   MCH 20.0 (L) 08/11/2018 1320   MCH 20.2 (L) 07/24/2018 0238   MCHC 28.8 (L) 08/11/2018 1320   MCHC 27.6 (L) 07/24/2018 0238   RDW 15.9 (H) 08/11/2018 1320    LYMPHSABS 1.8 08/11/2018 1320   MONOABS 0.6 07/24/2018 0238   EOSABS  0.4 08/11/2018 1320   BASOSABS 0.1 08/11/2018 1320   Iron/TIBC/Ferritin/ %Sat No results found for: IRON, TIBC, FERRITIN, IRONPCTSAT Lipid Panel     Component Value Date/Time   CHOL 155 08/11/2018 1320   TRIG 86 08/11/2018 1320   HDL 29 (L) 08/11/2018 1320   CHOLHDL 4.2 04/17/2018 1354   CHOLHDL 5.3 Ratio 03/25/2009 2040   VLDL 21 03/25/2009 2040   LDLCALC 109 (H) 08/11/2018 1320   Hepatic Function Panel     Component Value Date/Time   PROT 7.0 08/11/2018 1320   ALBUMIN 4.7 08/11/2018 1320   AST 12 08/11/2018 1320   ALT 23 08/11/2018 1320   ALKPHOS 47 08/11/2018 1320   BILITOT 0.3 08/11/2018 1320      Component Value Date/Time   TSH 1.200 08/11/2018 1320   TSH 0.615 07/20/2018 2307   TSH 2.300 04/17/2018 1354   TSH 1.403 10/21/2013 1634      OBESITY BEHAVIORAL INTERVENTION VISIT  Today's visit was # 3   Starting weight: 322 lbs Starting date: 08/11/2018 Today's weight : 314 lbs  Today's date: 09/09/2018 Total lbs lost to date: 8    ASK: We discussed the diagnosis of obesity with Molly Archer today and Molly Archer agreed to give [place] permission to discuss obesity behavioral modification therapy today.  ASSESS: Molly Archer has the diagnosis of obesity and her BMI today is 53.87 Molly Archer is in the action stage of change   ADVISE: Molly Archer was educated on the multiple health risks of obesity as well as the benefit of weight loss to improve her health. She was advised of the need for long term treatment and the importance of lifestyle modifications to improve her current health and to decrease her risk of future health problems.  AGREE: Multiple dietary modification options and treatment options were discussed and  Molly Archer agreed to follow the recommendations documented in the above note.  ARRANGE: Molly Archer was educated on the importance of frequent visits to treat obesity as outlined per CMS and  USPSTF guidelines and agreed to schedule her next follow up appointment today.  I, Trixie Dredge, am acting as transcriptionist for Ilene Qua, MD  I have reviewed the above documentation for accuracy and completeness, and I agree with the above. - Ilene Qua, MD

## 2018-09-13 ENCOUNTER — Other Ambulatory Visit: Payer: Self-pay

## 2018-09-15 ENCOUNTER — Ambulatory Visit (INDEPENDENT_AMBULATORY_CARE_PROVIDER_SITE_OTHER): Payer: BLUE CROSS/BLUE SHIELD | Admitting: Internal Medicine

## 2018-09-15 ENCOUNTER — Encounter: Payer: Self-pay | Admitting: Internal Medicine

## 2018-09-15 DIAGNOSIS — G8918 Other acute postprocedural pain: Secondary | ICD-10-CM | POA: Diagnosis not present

## 2018-09-15 MED ORDER — OXYCODONE-ACETAMINOPHEN 5-325 MG PO TABS: 1.0000 | ORAL_TABLET | Freq: Four times a day (QID) | ORAL | 0 refills | Status: DC | PRN

## 2018-09-15 MED ORDER — IBUPROFEN 800 MG PO TABS: 800.0000 mg | ORAL_TABLET | Freq: Three times a day (TID) | ORAL | 1 refills | Status: DC | PRN

## 2018-09-15 NOTE — Progress Notes (Signed)
Having vaginal bleeding since July 2019. Had D&C in December. Bleeding some better but since having balloon put in uterus to take pictures have been having cramping

## 2018-09-15 NOTE — Progress Notes (Signed)
   Subjective:    Molly Archer - 38 y.o. female MRN [id number]  Date of birth: [birth year]  HPI  Molly Archer is a 38 y.o. G0P0000 female here for walk in visit bleeding and cramping s/p D&C on 12/23 for long history of AUB. Has been seen several times in [facility] and in office for persistent bleeding and painful cramping. Has been taking Aygestin. Reports her bleeding has slowed and only requires about one to two pads per day. However, she continues to have lower abdominal cramping with the bleeding. Denies vaginal discharge, dysuria, vomiting and fevers. Requests refill on pain medication.     OB History    Gravida  0   Para  0   Term  0   Preterm  0   AB  0   Living  0     SAB  0   TAB  0   Ectopic  0   Multiple  0   Live Births               -  reports that she has never smoked. She has never used smokeless tobacco. - Review of Systems: Per HPI. - Past Medical History: Patient Active Problem List   Diagnosis Date Noted  . Thickened endometrium 07/20/2018  . History of endometrial hyperplasia 07/20/2018  . Morbid obesity with BMI of 50.0-59.9, adult ([place]) 07/20/2018  . Abnormal uterine bleeding (AUB)   . Hypertension 02/25/2012  . Hx of Bell's palsy 02/25/2012   - Medications: reviewed and updated   Objective:   Physical Exam BP (!) 141/89   Pulse 76   Wt (!) 317 lb (143.8 kg)   LMP  (LMP Unknown)   BMI 54.41 kg/m  Gen: NAD, alert, cooperative with exam, well-appearing Abd: SNTND, BS present, no guarding or organomegaly, obese  Psych: good insight, alert and oriented GU/GYN: Declined.         Assessment & Plan:   1. Post-operative pain Discussed with patient that she needs follow up with GYN if still having significant pain almost 2 months postoperatively. Refilled limited supply of narcotic and NSAIDs. Continue Aygestin. Follow up in one week.  - oxyCODONE-acetaminophen (PERCOCET/ROXICET) 5-325 MG tablet; Take 1 tablet by mouth every 6  (six) hours as needed for moderate pain.  Dispense: 10 tablet; Refill: 0 - ibuprofen (ADVIL,MOTRIN) 800 MG tablet; Take 1 tablet (800 mg total) by mouth every 8 (eight) hours as needed.  Dispense: 60 tablet; Refill: 1   Routine preventative health maintenance measures emphasized. Please refer to After Visit Summary for other counseling recommendations.   Return in about 1 week (around 09/22/2018) for post operative pain .  Phill Myron, D.O. OB Fellow  09/15/2018, 5:17 PM

## 2018-09-24 ENCOUNTER — Ambulatory Visit (INDEPENDENT_AMBULATORY_CARE_PROVIDER_SITE_OTHER): Payer: BLUE CROSS/BLUE SHIELD | Admitting: Family Medicine

## 2018-09-24 ENCOUNTER — Encounter: Payer: Self-pay | Admitting: Obstetrics and Gynecology

## 2018-09-24 ENCOUNTER — Ambulatory Visit: Payer: BLUE CROSS/BLUE SHIELD | Admitting: Obstetrics and Gynecology

## 2018-09-24 ENCOUNTER — Encounter (INDEPENDENT_AMBULATORY_CARE_PROVIDER_SITE_OTHER): Payer: Self-pay | Admitting: Family Medicine

## 2018-09-24 VITALS — BP 140/77 | HR 79 | Wt 315.3 lb

## 2018-09-24 VITALS — BP 140/84 | HR 72 | Temp 98.5°F | Ht 64.0 in | Wt 311.0 lb

## 2018-09-24 DIAGNOSIS — Z3202 Encounter for pregnancy test, result negative: Secondary | ICD-10-CM

## 2018-09-24 DIAGNOSIS — T8332XA Displacement of intrauterine contraceptive device, initial encounter: Secondary | ICD-10-CM

## 2018-09-24 DIAGNOSIS — D508 Other iron deficiency anemias: Secondary | ICD-10-CM

## 2018-09-24 DIAGNOSIS — N939 Abnormal uterine and vaginal bleeding, unspecified: Secondary | ICD-10-CM

## 2018-09-24 DIAGNOSIS — I10 Essential (primary) hypertension: Secondary | ICD-10-CM

## 2018-09-24 DIAGNOSIS — Z9189 Other specified personal risk factors, not elsewhere classified: Secondary | ICD-10-CM

## 2018-09-24 DIAGNOSIS — T8332XD Displacement of intrauterine contraceptive device, subsequent encounter: Secondary | ICD-10-CM

## 2018-09-24 DIAGNOSIS — Z6841 Body Mass Index (BMI) 40.0 and over, adult: Secondary | ICD-10-CM

## 2018-09-24 DIAGNOSIS — E559 Vitamin D deficiency, unspecified: Secondary | ICD-10-CM

## 2018-09-24 DIAGNOSIS — N946 Dysmenorrhea, unspecified: Secondary | ICD-10-CM | POA: Diagnosis not present

## 2018-09-24 LAB — POCT PREGNANCY, URINE: Preg Test, Ur: NEGATIVE

## 2018-09-24 MED ORDER — FERROUS GLUCONATE 324 (38 FE) MG PO TABS: 324.0000 mg | ORAL_TABLET | Freq: Two times a day (BID) | ORAL | 0 refills | Status: DC

## 2018-09-24 MED ORDER — AMLODIPINE BESYLATE 10 MG PO TABS: 10.0000 mg | ORAL_TABLET | Freq: Every day | ORAL | 0 refills | Status: DC

## 2018-09-24 MED ORDER — VITAMIN D (ERGOCALCIFEROL) 1.25 MG (50000 UNIT) PO CAPS: 50000.0000 [IU] | ORAL_CAPSULE | ORAL | 0 refills | Status: DC

## 2018-09-24 NOTE — Progress Notes (Signed)
Obstetrics and Gynecology Visit Return Patient Evaluation  Appointment Date: 09/24/2018  Primary Care Provider: Wiseman, Archer for [facility] [place]  Chief Complaint: f/u AUB and abdominal pain  History of Present Illness:  Molly Archer is a 38 y.o. P0 (LMP: unknown due to AUB) with above CC. PMHx significant for BMI 34s, HTN, long history of AUB, h/o endometrial hyperplasia.  Interval History: patient seen last week by Molly Archer and had continued AUB and pain after December 2019 hysteroscopy d&c for AUB by Molly Archer. Pt given refill on pain medications and told to continue aygestin and set up for f/u with a GYN MD. Her last few surg path results have been benign.   Patient w/o pain or bleeding since yesterday.   Review of Systems: as noted in the History of Present Illness.  Medications:  Molly Archer had no medications administered during this visit. Current Outpatient Medications  Medication Sig Dispense Refill  . amLODipine (NORVASC) 10 MG tablet Take 1 tablet (10 mg total) by mouth daily. 30 tablet 0  . ferrous gluconate (FERGON) 324 MG tablet Take 1 tablet (324 mg total) by mouth 2 (two) times daily with a meal. 60 tablet 0  . ibuprofen (ADVIL,MOTRIN) 800 MG tablet Take 1 tablet (800 mg total) by mouth every 8 (eight) hours as needed. 60 tablet 1  . norethindrone (AYGESTIN) 5 MG tablet Take 2 tablets (10 mg total) by mouth daily. 60 tablet 3  . oxyCODONE-acetaminophen (PERCOCET/ROXICET) 5-325 MG tablet Take 1 tablet by mouth every 6 (six) hours as needed for moderate pain. 10 tablet 0  . albuterol (PROVENTIL HFA;VENTOLIN HFA) 108 (90 Base) MCG/ACT inhaler Inhale 2 puffs into the lungs every 4 (four) hours as needed for wheezing or shortness of breath (cough, shortness of breath or wheezing.). 1 Inhaler 1  . diclofenac (VOLTAREN) 75 MG EC tablet Take 1 tablet (75 mg total) by mouth 2 (two) times daily with a meal. (Patient not taking:  Reported on 09/15/2018) 60 tablet 0  . polyethylene glycol powder (MIRALAX) powder Take 1 Container by mouth once.    . triamcinolone cream (KENALOG) 0.1 % Apply 1 application topically 2 (two) times daily. (Patient not taking: Reported on 09/15/2018) 30 g 0  . Vitamin D, Ergocalciferol, (DRISDOL) 1.25 MG (50000 UT) CAPS capsule Take 1 capsule (50,000 Units total) by mouth every 7 (seven) days. (Patient not taking: Reported on 09/24/2018) 4 capsule 0   No current facility-administered medications for this visit.     Allergies: is allergic to codeine; peanut-containing drug products; penicillins; and shellfish allergy.  Physical Exam:  BP 140/77   Pulse 79   Wt (!) 315 lb 4.8 oz (143 kg)   LMP  (LMP Unknown)   BMI 54.12 kg/m  Body mass index is 54.12 kg/m. General appearance: Well nourished, well developed female in no acute distress.  Neuro/Psych:  Normal mood and affect.    Assessment: pt stable  Plan:  1. Intrauterine contraceptive device threads lost, subsequent encounter Pt states that first IUD several years ago it fell out and in blood clot and she showed it provider. Last IUD was placed in mid 2016 and she states that that fell out and she went to an OSH ED and showed them this. Pt has never had any imaging such as a CT a/p or xray to formally evaluate this with pelvic u/s not showing anything in the uterus. I told her I recommend an a/p x ray series  just to formally say that there isn't an IUD outside of the uterus. Pt declines this. Order is in and pt told to call us or the [facility] to have this done.  - DG Abd 1 View; Future  2. Abnormal uterine bleeding (AUB) Pt desires to preserve fertility. I told her that her AUB is likely related to her BMI and that she seems to be prone to make endometrial polyps. She states she was on depo provera in the past for 7 years and was amenorrheic on it but stopped it b/c she was told not to be on this for long periods of time and she had some  hair loss. I told her that she needs to be on some type of medical therapy to tx her aub and for her uterine cancer risk, with the only other alternative that I'd recommend at this point is provera 10mg  po qday x 14d a month. Pt would like to consider options  3. Dysmenorrhea Pain with bleeding only. See above   RTC: 83m for f/u   Molly Romans MD Attending Molly Archer Molly Archer)

## 2018-09-24 NOTE — Progress Notes (Signed)
Office: [phone]  /  Fax: [phone]   HPI:   Chief Complaint: OBESITY Molly Archer is here to discuss her progress with her obesity treatment plan. She is on the Category 4 plan and is following her eating plan approximately 90 % of the time. She states she is walking for 20-30 minutes 3 times per week. Molly Archer is having a difficult time getting all protein in.  Her weight is (!) 311 lb (141.1 kg) today and has had a weight loss of 3 pounds over a period of 2 weeks since her last visit. She has lost 11 lbs since starting treatment with [place].  Hypertension Molly Archer is a 38 y.o. female with hypertension. Molly Archer's BP is moderately well controlled on amlodipine. She denies chest pain or shortness of breath. She is working on weight loss to help control her blood pressure with the goal of decreasing her risk of heart attack and stroke. BP Readings from Last 3 Encounters:  09/24/18 140/84  09/24/18 140/77  09/15/18 (!) 141/89     At risk for cardiovascular disease Molly Archer is at a higher than average risk for cardiovascular disease due to obesity and hypertension. She currently denies any chest pain.  Vitamin D Deficiency Molly Archer has a diagnosis of vitamin D deficiency. She is currently taking prescription Vit D, but level is not at goal. Last Vit D level was very low at 7.6. She notes fatigue and denies nausea, vomiting or muscle weakness.  Iron Deficiency Anemia Molly Archer has a diagnosis of iron deficiency anemia from heavy periods. She started iron supplementation recently and is tolerating it well.    ASSESSMENT AND PLAN:  Essential hypertension - Plan: amLODipine (NORVASC) 10 MG tablet  Vitamin D deficiency - Plan: Vitamin D, Ergocalciferol, (DRISDOL) 1.25 MG (50000 UT) CAPS capsule  Other iron deficiency anemia - Plan: ferrous gluconate (FERGON) 324 MG tablet  At risk for heart disease  Class 3 severe obesity with serious comorbidity and body mass index (BMI) of 50.0  to 59.9 in adult, unspecified obesity type (HCC)  PLAN:  Hypertension We discussed sodium restriction, working on healthy weight loss, and a regular exercise program as the means to achieve improved blood pressure control. Molly Archer agreed with this plan and agreed to follow up as directed. We will continue to monitor her blood pressure as well as her progress with the above lifestyle modifications. Molly Archer agrees to continue taking amlodipine 10 mg qd #30 and we will refill for 1 month. She will watch for signs of hypotension as she continues her lifestyle modifications. Molly Archer agrees to follow up with our clinic in 2 to 3 weeks.   Cardiovascular risk counseling Molly Archer was given extended (15 minutes) coronary artery disease prevention counseling today. She is 38 y.o. female and has risk factors for heart disease including obesity and hypertension. We discussed intensive lifestyle modifications today with an emphasis on specific weight loss instructions and strategies. Pt was also informed of the importance of increasing exercise and decreasing saturated fats to help prevent heart disease.  Vitamin D Deficiency Molly Archer was informed that low vitamin D levels contributes to fatigue and are associated with obesity, breast, and colon cancer. Molly Archer agrees to continue taking prescription Vit D @50 ,000 IU every week #4 and we will refill for 1 month. She will follow up for routine testing of vitamin D, at least 2-3 times per year. She was informed of the risk of over-replacement of vitamin D and agrees to not increase her dose unless she discusses  this with [place] first. Molly Archer agrees to follow up with our clinic in 2 to 3 weeks.  Iron Deficiency Anemia The diagnosis of Iron deficiency anemia was discussed with Molly Archer and was explained in detail. She was given suggestions of iron rich foods. Molly Archer agrees to continue taking Fergon 324 mg BID #60 and we will refill for 1 month. Molly Archer agrees to follow up  with our clinic in 2 to 3 weeks.  Obesity Molly Archer is currently in the action stage of change. As such, her goal is to continue with weight loss efforts She has agreed to follow the Category 4 plan Molly Archer will continue current exercise regimen for weight loss and overall health benefits. We discussed the following Behavioral Modification Strategies today: increasing lean protein intake and planning for success We discussed strategies for getting in protein.  Molly Archer has agreed to follow up with our clinic in 2 to 3 weeks. She was informed of the importance of frequent follow up visits to maximize her success with intensive lifestyle modifications for her multiple health conditions.  ALLERGIES: Allergies  Allergen Reactions  . Codeine Itching  . Peanut-Containing Drug Products Itching  . Penicillins Hives    Has patient had a PCN reaction causing immediate rash, facial/tongue/throat swelling, SOB or lightheadedness with hypotension: No Has patient had a PCN reaction causing severe rash involving mucus membranes or skin necrosis: No Has patient had a PCN reaction that required hospitalization No Has patient had a PCN reaction occurring within the last 10 years: No If all of the above answers are "NO", then may proceed with Cephalosporin use.   . Shellfish Allergy Itching    MEDICATIONS: Current Outpatient Medications on File Prior to Visit  Medication Sig Dispense Refill  . albuterol (PROVENTIL HFA;VENTOLIN HFA) 108 (90 Base) MCG/ACT inhaler Inhale 2 puffs into the lungs every 4 (four) hours as needed for wheezing or shortness of breath (cough, shortness of breath or wheezing.). 1 Inhaler 1  . diclofenac (VOLTAREN) 75 MG EC tablet Take 1 tablet (75 mg total) by mouth 2 (two) times daily with a meal. 60 tablet 0  . ibuprofen (ADVIL,MOTRIN) 800 MG tablet Take 1 tablet (800 mg total) by mouth every 8 (eight) hours as needed. 60 tablet 1  . norethindrone (AYGESTIN) 5 MG tablet Take 2  tablets (10 mg total) by mouth daily. 60 tablet 3  . oxyCODONE-acetaminophen (PERCOCET/ROXICET) 5-325 MG tablet Take 1 tablet by mouth every 6 (six) hours as needed for moderate pain. 10 tablet 0  . polyethylene glycol powder (MIRALAX) powder Take 1 Container by mouth once.    . triamcinolone cream (KENALOG) 0.1 % Apply 1 application topically 2 (two) times daily. 30 g 0   No current facility-administered medications on file prior to visit.     PAST MEDICAL HISTORY: Past Medical History:  Diagnosis Date  . Anemia   . Asthma    only uses inhaler in fall and spring  . Constipation   . GERD (gastroesophageal reflux disease)    diet controlled, occ. uses omeprazole  . Headache    otc med prn  . History of kidney stones   . Hx of Bell's palsy June 2013  . Hypertension    currently no meds  . Menometrorrhagia   . Prediabetes   . Psoriasis   . Shortness of breath dyspnea    with exercise/exertion  . Vitamin D deficiency     PAST SURGICAL HISTORY: Past Surgical History:  Procedure Laterality Date  . COMBINED HYSTEROSCOPY  DIAGNOSTIC / D&C N/A 2011   Benign secretory endometrium  . CYSTOSCOPY WITH STENT PLACEMENT Left 10/19/2014   Procedure: CYSTOSCOPY WITH STENT PLACEMENT;  Surgeon: Ardis Hughs, MD;  Location: [facility];  Service: Urology;  Laterality: Left;  . DILATION AND CURETTAGE OF UTERUS  10/2003   Polyp, mild atypia, simple and complex hyperplasia,  . DILATION AND CURETTAGE OF UTERUS  11/2003   Simple and complex hyperplasia  . DILATION AND CURETTAGE OF UTERUS  2011  . DILATION AND CURETTAGE OF UTERUS N/A 12/14/2014   Procedure: DILATATION AND CURETTAGE;  Surgeon: Mora Bellman, MD;  Location: [facility];  Service: Gynecology;  Laterality: N/A;  . HOLMIUM LASER APPLICATION Left 7/82/9562   Procedure: HOLMIUM LASER APPLICATION;  Surgeon: Ardis Hughs, MD;  Location: [facility];  Service: Urology;  Laterality: Left;  . HYSTEROSCOPY W/D&C N/A 07/21/2018   Procedure:  DILATATION AND CURETTAGE /HYSTEROSCOPY;  Surgeon: Woodroe Mode, MD;  Location: [facility];  Service: Gynecology;  Laterality: N/A;  . INTRAUTERINE DEVICE (IUD) INSERTION N/A 12/14/2014   Procedure: INTRAUTERINE DEVICE (IUD) INSERTION;  Surgeon: Mora Bellman, MD;  Location: [facility];  Service: Gynecology;  Laterality: N/A;  . STONE EXTRACTION WITH BASKET Left 10/19/2014   Procedure: STONE EXTRACTION WITH BASKET;  Surgeon: Ardis Hughs, MD;  Location: [facility];  Service: Urology;  Laterality: Left;  . WISDOM TOOTH EXTRACTION      SOCIAL HISTORY: Social History   Tobacco Use  . Smoking status: Never Smoker  . Smokeless tobacco: Never Used  Substance Use Topics  . Alcohol use: No  . Drug use: No    FAMILY HISTORY: Family History  Problem Relation Age of Onset  . Hypertension Other   . Diabetes Other   . Cancer Other   . Hypertension Mother   . Hyperlipidemia Mother   . Hypertension Father     ROS: Review of Systems  Constitutional: Positive for malaise/fatigue and weight loss.  Respiratory: Negative for shortness of breath.   Cardiovascular: Negative for chest pain.  Gastrointestinal: Negative for nausea and vomiting.  Musculoskeletal:       Negative muscle weakness    PHYSICAL EXAM: Blood pressure 140/84, pulse 72, temperature 98.5 F (36.9 C), height 5\' 4"  (1.626 m), weight (!) 311 lb (141.1 kg), last menstrual period 09/24/2018, SpO2 98 %. Body mass index is 53.38 kg/m. Physical Exam Vitals signs reviewed.  Constitutional:      Appearance: Normal appearance. She is obese.  Cardiovascular:     Rate and Rhythm: Normal rate.     Pulses: Normal pulses.  Pulmonary:     Effort: Pulmonary effort is normal.     Breath sounds: Normal breath sounds.  Musculoskeletal: Normal range of motion.  Skin:    General: Skin is warm and dry.  Neurological:     Mental Status: She is alert and oriented to person, place, and time.  Psychiatric:        Mood and Affect: Mood  normal.        Behavior: Behavior normal.     RECENT LABS AND TESTS: BMET    Component Value Date/Time   NA 141 08/11/2018 1320   K 4.4 08/11/2018 1320   CL 104 08/11/2018 1320   CO2 21 08/11/2018 1320   GLUCOSE 78 08/11/2018 1320   GLUCOSE 109 (H) 07/21/2018 0555   BUN 10 08/11/2018 1320   CREATININE 0.60 08/11/2018 1320   CALCIUM 9.3 08/11/2018 1320   GFRNONAA 118 08/11/2018 1320  GFRAA 136 08/11/2018 1320   Lab Results  Component Value Date   HGBA1C 6.1 (H) 08/11/2018   HGBA1C 5.7 (H) 04/17/2018   Lab Results  Component Value Date   INSULIN 38.4 (H) 08/11/2018   CBC    Component Value Date/Time   WBC 7.0 08/11/2018 1320   WBC 7.7 07/24/2018 0238   RBC 4.24 08/11/2018 1320   RBC 4.20 07/24/2018 0238   HGB 8.5 (L) 08/11/2018 1320   HCT 29.5 (L) 08/11/2018 1320   PLT 369 07/24/2018 0238   PLT 451 (H) 06/09/2018 1743   MCV 70 (L) 08/11/2018 1320   MCH 20.0 (L) 08/11/2018 1320   MCH 20.2 (L) 07/24/2018 0238   MCHC 28.8 (L) 08/11/2018 1320   MCHC 27.6 (L) 07/24/2018 0238   RDW 15.9 (H) 08/11/2018 1320   LYMPHSABS 1.8 08/11/2018 1320   MONOABS 0.6 07/24/2018 0238   EOSABS 0.4 08/11/2018 1320   BASOSABS 0.1 08/11/2018 1320   Iron/TIBC/Ferritin/ %Sat No results found for: IRON, TIBC, FERRITIN, IRONPCTSAT Lipid Panel     Component Value Date/Time   CHOL 155 08/11/2018 1320   TRIG 86 08/11/2018 1320   HDL 29 (L) 08/11/2018 1320   CHOLHDL 4.2 04/17/2018 1354   CHOLHDL 5.3 Ratio 03/25/2009 2040   VLDL 21 03/25/2009 2040   LDLCALC 109 (H) 08/11/2018 1320   Hepatic Function Panel     Component Value Date/Time   PROT 7.0 08/11/2018 1320   ALBUMIN 4.7 08/11/2018 1320   AST 12 08/11/2018 1320   ALT 23 08/11/2018 1320   ALKPHOS 47 08/11/2018 1320   BILITOT 0.3 08/11/2018 1320      Component Value Date/Time   TSH 1.200 08/11/2018 1320   TSH 0.615 07/20/2018 2307   TSH 2.300 04/17/2018 1354   TSH 1.403 10/21/2013 1634      OBESITY BEHAVIORAL  INTERVENTION VISIT  Today's visit was # 4   Starting weight: 322 lbs Starting date: 08/11/2018 Today's weight : 311 lbs  Today's date: 09/24/2018 Total lbs lost to date: 11    09/24/2018 1500  Height 5\' 4"  (1.626 m)  Weight 311 lb (141.1 kg) (A)  BMI (Calculated) 53.36   Body Fat % 57.1 %     ASK: We discussed the diagnosis of obesity with Molly Archer today and Myrtha agreed to give [place] permission to discuss obesity behavioral modification therapy today.  ASSESS: Aprill has the diagnosis of obesity and her BMI today is 53.36 Charles is in the action stage of change   ADVISE: Temeca was educated on the multiple health risks of obesity as well as the benefit of weight loss to improve her health. She was advised of the need for long term treatment and the importance of lifestyle modifications to improve her current health and to decrease her risk of future health problems.  AGREE: Multiple dietary modification options and treatment options were discussed and  Jazman agreed to follow the recommendations documented in the above note.  ARRANGE: Dorthea was educated on the importance of frequent visits to treat obesity as outlined per CMS and USPSTF guidelines and agreed to schedule her next follow up appointment today.  Wilhemena Durie, am acting as Location manager for [employer], FNP-C.  I have reviewed the above documentation for accuracy and completeness, and I agree with the above.  - Dawn Whitmire, FNP-C.

## 2018-09-25 ENCOUNTER — Encounter (INDEPENDENT_AMBULATORY_CARE_PROVIDER_SITE_OTHER): Payer: Self-pay | Admitting: Family Medicine

## 2018-09-25 ENCOUNTER — Encounter (INDEPENDENT_AMBULATORY_CARE_PROVIDER_SITE_OTHER): Payer: Self-pay

## 2018-09-25 DIAGNOSIS — Z6841 Body Mass Index (BMI) 40.0 and over, adult: Secondary | ICD-10-CM | POA: Insufficient documentation

## 2018-09-25 DIAGNOSIS — D649 Anemia, unspecified: Secondary | ICD-10-CM | POA: Insufficient documentation

## 2018-09-25 DIAGNOSIS — E559 Vitamin D deficiency, unspecified: Secondary | ICD-10-CM | POA: Insufficient documentation

## 2018-10-06 NOTE — Telephone Encounter (Signed)
This populated in the [place] clinical pool but patient has never been seen here.

## 2018-10-09 ENCOUNTER — Ambulatory Visit (INDEPENDENT_AMBULATORY_CARE_PROVIDER_SITE_OTHER): Payer: BLUE CROSS/BLUE SHIELD | Admitting: Dietician

## 2018-10-21 ENCOUNTER — Telehealth: Payer: Self-pay | Admitting: Student

## 2018-10-21 ENCOUNTER — Encounter (INDEPENDENT_AMBULATORY_CARE_PROVIDER_SITE_OTHER): Payer: Self-pay

## 2018-10-21 ENCOUNTER — Ambulatory Visit (INDEPENDENT_AMBULATORY_CARE_PROVIDER_SITE_OTHER): Payer: BLUE CROSS/BLUE SHIELD | Admitting: Family Medicine

## 2018-10-21 ENCOUNTER — Encounter (INDEPENDENT_AMBULATORY_CARE_PROVIDER_SITE_OTHER): Payer: Self-pay | Admitting: Family Medicine

## 2018-10-21 ENCOUNTER — Other Ambulatory Visit: Payer: Self-pay

## 2018-10-21 DIAGNOSIS — E559 Vitamin D deficiency, unspecified: Secondary | ICD-10-CM | POA: Diagnosis not present

## 2018-10-21 DIAGNOSIS — D508 Other iron deficiency anemias: Secondary | ICD-10-CM

## 2018-10-21 DIAGNOSIS — I10 Essential (primary) hypertension: Secondary | ICD-10-CM

## 2018-10-21 DIAGNOSIS — Z6841 Body Mass Index (BMI) 40.0 and over, adult: Secondary | ICD-10-CM

## 2018-10-21 MED ORDER — AMLODIPINE BESYLATE 10 MG PO TABS: 10.0000 mg | ORAL_TABLET | Freq: Every day | ORAL | 0 refills | Status: DC

## 2018-10-21 MED ORDER — FERROUS GLUCONATE 324 (38 FE) MG PO TABS: 324.0000 mg | ORAL_TABLET | Freq: Two times a day (BID) | ORAL | 0 refills | Status: DC

## 2018-10-21 MED ORDER — VITAMIN D (ERGOCALCIFEROL) 1.25 MG (50000 UNIT) PO CAPS: 50000.0000 [IU] | ORAL_CAPSULE | ORAL | 0 refills | Status: DC

## 2018-10-21 NOTE — Telephone Encounter (Signed)
Called the patient to inform of the televisit tomorrow. No questions at this time.

## 2018-10-22 ENCOUNTER — Other Ambulatory Visit: Payer: Self-pay

## 2018-10-22 ENCOUNTER — Encounter (INDEPENDENT_AMBULATORY_CARE_PROVIDER_SITE_OTHER): Payer: Self-pay | Admitting: Family Medicine

## 2018-10-22 ENCOUNTER — Telehealth (INDEPENDENT_AMBULATORY_CARE_PROVIDER_SITE_OTHER): Payer: BLUE CROSS/BLUE SHIELD | Admitting: Obstetrics & Gynecology

## 2018-10-22 DIAGNOSIS — N939 Abnormal uterine and vaginal bleeding, unspecified: Secondary | ICD-10-CM

## 2018-10-22 MED ORDER — NORETHINDRONE ACETATE 5 MG PO TABS: 15.0000 mg | ORAL_TABLET | Freq: Every day | ORAL | 5 refills | Status: AC

## 2018-10-22 NOTE — Progress Notes (Addendum)
Office: [phone]  /  Fax: [phone] TeleHealth Visit:  Molly Archer has consented to this TeleHealth visit today via telephone. The patient is located at home, the provider is located at the [facility] office. The participants in this visit include the listed provider and patient and any and all parties involved.  HPI:   Chief Complaint: OBESITY Molly Archer is here to discuss her progress with her obesity treatment plan. She is on the Category 4 plan and is following her eating plan approximately 85 % of the time. She states she is walking 20 minutes 3 times per week. Molly Archer has not weighed recently. She has had a decrease in appetite and she has not been eating. Molly Archer feels she has lost weight. She is not eating the protein on the plan. We were unable to weight the patient today for this TeleHealth visit.She feels as if she has lost weight since her last visit.   Vitamin D deficiency Molly Archer has a diagnosis of vitamin D deficiency. Her last vitamin D level was at 7.6 on 10/01/18 and she is not at goal. She is currently taking vit D and denies nausea, vomiting or muscle weakness.  Hypertension Molly Archer is a 38 y.o. female with hypertension. She has not checked her blood pressure recently. Molly Archer denies chest pain or shortness of breath on exertion. She is working weight loss to help control her blood pressure with the goal of decreasing her risk of heart attack and stroke.   Iron Deficiency Anemia Molly Archer has a diagnosis of anemia. She has infrequent, but very long menstrual periods. Molly Archer had D&C in September and she is on new birth control. Molly Archer denies chest pain or shortness of breath.  ASSESSMENT AND PLAN:  Vitamin D deficiency - Plan: Vitamin D, Ergocalciferol, (DRISDOL) 1.25 MG (50000 UT) CAPS capsule  Essential hypertension - Plan: amLODipine (NORVASC) 10 MG tablet  Other iron deficiency anemia - Plan: ferrous gluconate (FERGON)  324 MG tablet  Class 3 severe obesity with serious comorbidity and body mass index (BMI) of 50.0 to 59.9 in adult, unspecified obesity type (HCC)  PLAN:  Vitamin D Deficiency Molly Archer was informed that low vitamin D levels contributes to fatigue and are associated with obesity, breast, and colon cancer. She agrees to continue to take prescription Vit D @50 ,000 IU every week #4 with no refills and will follow up for routine testing of vitamin D, at least 2-3 times per year. She was informed of the risk of over-replacement of vitamin D and agrees to not increase her dose unless she discusses this with [place] first. Molly Archer agrees to follow up as directed.  Hypertension We discussed sodium restriction, working on healthy weight loss, and a regular exercise program as the means to achieve improved blood pressure control. Molly Archer agreed with this plan and agreed to follow up as directed. We will continue to monitor her blood pressure as well as her progress with the above lifestyle modifications. She agrees to continue Norvasc 10 mg #30 with no refills. Molly Archer will check her blood pressure before the next visit and report reading at next visit. Molly Archer agrees to follow up with our clinic in 3 weeks.  Iron Deficiency Anemia Molly Archer agrees to continue Ferrous Gluconate 324 mg BID with meals #60 with no refills. She agrees to follow up with our clinic in 3 weeks.  Obesity Molly Archer is currently in the action stage of change. As such, her goal is to continue with weight loss  efforts She has agreed to follow the Category 4 plan Molly Archer will continue current exercise regimen for weight loss and overall health benefits. We discussed the following Behavioral Modification Strategies today: planning for success, no skipping meals and increasing lean protein intake  Molly Archer has agreed to follow up with our clinic in 3 weeks. She was informed of the importance of frequent follow up visits to maximize her success with  intensive lifestyle modifications for her multiple health conditions.  ALLERGIES: Allergies  Allergen Reactions  . Codeine Itching  . Peanut-Containing Drug Products Itching  . Penicillins Hives    Has patient had a PCN reaction causing immediate rash, facial/tongue/throat swelling, SOB or lightheadedness with hypotension: No Has patient had a PCN reaction causing severe rash involving mucus membranes or skin necrosis: No Has patient had a PCN reaction that required hospitalization No Has patient had a PCN reaction occurring within the last 10 years: No If all of the above answers are "NO", then may proceed with Cephalosporin use.   . Shellfish Allergy Itching    MEDICATIONS: Current Outpatient Medications on File Prior to Visit  Medication Sig Dispense Refill  . albuterol (PROVENTIL HFA;VENTOLIN HFA) 108 (90 Base) MCG/ACT inhaler Inhale 2 puffs into the lungs every 4 (four) hours as needed for wheezing or shortness of breath (cough, shortness of breath or wheezing.). 1 Inhaler 1  . diclofenac (VOLTAREN) 75 MG EC tablet Take 1 tablet (75 mg total) by mouth 2 (two) times daily with a meal. 60 tablet 0  . ibuprofen (ADVIL,MOTRIN) 800 MG tablet Take 1 tablet (800 mg total) by mouth every 8 (eight) hours as needed. 60 tablet 1  . norethindrone (AYGESTIN) 5 MG tablet Take 2 tablets (10 mg total) by mouth daily. 60 tablet 3  . oxyCODONE-acetaminophen (PERCOCET/ROXICET) 5-325 MG tablet Take 1 tablet by mouth every 6 (six) hours as needed for moderate pain. 10 tablet 0  . polyethylene glycol powder (MIRALAX) powder Take 1 Container by mouth once.    . triamcinolone cream (KENALOG) 0.1 % Apply 1 application topically 2 (two) times daily. 30 g 0   No current facility-administered medications on file prior to visit.     PAST MEDICAL HISTORY: Past Medical History:  Diagnosis Date  . Anemia   . Asthma    only uses inhaler in fall and spring  . Constipation   . GERD (gastroesophageal reflux  disease)    diet controlled, occ. uses omeprazole  . Headache    otc med prn  . History of kidney stones   . Hx of Bell's palsy June 2013  . Hypertension    currently no meds  . Menometrorrhagia   . Prediabetes   . Psoriasis   . Shortness of breath dyspnea    with exercise/exertion  . Vitamin D deficiency     PAST SURGICAL HISTORY: Past Surgical History:  Procedure Laterality Date  . COMBINED HYSTEROSCOPY DIAGNOSTIC / D&C N/A 2011   Benign secretory endometrium  . CYSTOSCOPY WITH STENT PLACEMENT Left 10/19/2014   Procedure: CYSTOSCOPY WITH STENT PLACEMENT;  Surgeon: Ardis Hughs, MD;  Location: [facility];  Service: Urology;  Laterality: Left;  . DILATION AND CURETTAGE OF UTERUS  10/2003   Polyp, mild atypia, simple and complex hyperplasia,  . DILATION AND CURETTAGE OF UTERUS  11/2003   Simple and complex hyperplasia  . DILATION AND CURETTAGE OF UTERUS  2011  . DILATION AND CURETTAGE OF UTERUS N/A 12/14/2014   Procedure: DILATATION AND CURETTAGE;  Surgeon: Vickii Chafe  Constant, MD;  Location: [facility];  Service: Gynecology;  Laterality: N/A;  . HOLMIUM LASER APPLICATION Left 7/79/3903   Procedure: HOLMIUM LASER APPLICATION;  Surgeon: Ardis Hughs, MD;  Location: [facility];  Service: Urology;  Laterality: Left;  . HYSTEROSCOPY W/D&C N/A 07/21/2018   Procedure: DILATATION AND CURETTAGE /HYSTEROSCOPY;  Surgeon: Woodroe Mode, MD;  Location: [facility];  Service: Gynecology;  Laterality: N/A;  . INTRAUTERINE DEVICE (IUD) INSERTION N/A 12/14/2014   Procedure: INTRAUTERINE DEVICE (IUD) INSERTION;  Surgeon: Mora Bellman, MD;  Location: [facility];  Service: Gynecology;  Laterality: N/A;  . STONE EXTRACTION WITH BASKET Left 10/19/2014   Procedure: STONE EXTRACTION WITH BASKET;  Surgeon: Ardis Hughs, MD;  Location: [facility];  Service: Urology;  Laterality: Left;  . WISDOM TOOTH EXTRACTION      SOCIAL HISTORY: Social History   Tobacco Use  . Smoking status: Never Smoker  . Smokeless  tobacco: Never Used  Substance Use Topics  . Alcohol use: No  . Drug use: No    FAMILY HISTORY: Family History  Problem Relation Age of Onset  . Hypertension Other   . Diabetes Other   . Cancer Other   . Hypertension Mother   . Hyperlipidemia Mother   . Hypertension Father     ROS: Review of Systems  Constitutional: Positive for weight loss.  Respiratory: Negative for shortness of breath.        Negative for shortness of breath on exertion   Cardiovascular: Negative for chest pain.  Gastrointestinal: Negative for nausea and vomiting.  Musculoskeletal:       Negative for muscle weakness    PHYSICAL EXAM: Pt in no acute distress  RECENT LABS AND TESTS: BMET    Component Value Date/Time   NA 141 08/11/2018 1320   K 4.4 08/11/2018 1320   CL 104 08/11/2018 1320   CO2 21 08/11/2018 1320   GLUCOSE 78 08/11/2018 1320   GLUCOSE 109 (H) 07/21/2018 0555   BUN 10 08/11/2018 1320   CREATININE 0.60 08/11/2018 1320   CALCIUM 9.3 08/11/2018 1320   GFRNONAA 118 08/11/2018 1320   GFRAA 136 08/11/2018 1320   Lab Results  Component Value Date   HGBA1C 6.1 (H) 08/11/2018   HGBA1C 5.7 (H) 04/17/2018   Lab Results  Component Value Date   INSULIN 38.4 (H) 08/11/2018   CBC    Component Value Date/Time   WBC 7.0 08/11/2018 1320   WBC 7.7 07/24/2018 0238   RBC 4.24 08/11/2018 1320   RBC 4.20 07/24/2018 0238   HGB 8.5 (L) 08/11/2018 1320   HCT 29.5 (L) 08/11/2018 1320   PLT 369 07/24/2018 0238   PLT 451 (H) 06/09/2018 1743   MCV 70 (L) 08/11/2018 1320   MCH 20.0 (L) 08/11/2018 1320   MCH 20.2 (L) 07/24/2018 0238   MCHC 28.8 (L) 08/11/2018 1320   MCHC 27.6 (L) 07/24/2018 0238   RDW 15.9 (H) 08/11/2018 1320   LYMPHSABS 1.8 08/11/2018 1320   MONOABS 0.6 07/24/2018 0238   EOSABS 0.4 08/11/2018 1320   BASOSABS 0.1 08/11/2018 1320   Iron/TIBC/Ferritin/ %Sat No results found for: IRON, TIBC, FERRITIN, IRONPCTSAT Lipid Panel     Component Value Date/Time   CHOL 155  08/11/2018 1320   TRIG 86 08/11/2018 1320   HDL 29 (L) 08/11/2018 1320   CHOLHDL 4.2 04/17/2018 1354   CHOLHDL 5.3 Ratio 03/25/2009 2040   VLDL 21 03/25/2009 2040   LDLCALC 109 (H) 08/11/2018 1320   Hepatic Function Panel  Component Value Date/Time   PROT 7.0 08/11/2018 1320   ALBUMIN 4.7 08/11/2018 1320   AST 12 08/11/2018 1320   ALT 23 08/11/2018 1320   ALKPHOS 47 08/11/2018 1320   BILITOT 0.3 08/11/2018 1320      Component Value Date/Time   TSH 1.200 08/11/2018 1320   TSH 0.615 07/20/2018 2307   TSH 2.300 04/17/2018 1354   TSH 1.403 10/21/2013 1634     Ref. Range 08/11/2018 13:20  Vitamin D, 25-Hydroxy Latest Ref Range: 30.0 - 100.0 ng/mL 7.6 (L)    I, Doreene Nest, am acting as Location manager for [employer], FNP-C.  I have reviewed the above documentation for accuracy and completeness, and I agree with the above.  - Cari Burgo, FNP-C.

## 2018-10-22 NOTE — Patient Instructions (Signed)
Return to clinic for any scheduled appointments or for any gynecologic concerns as needed.   

## 2018-10-22 NOTE — Progress Notes (Signed)
TELEHEALTH VIRTUAL GYNECOLOGY VISIT ENCOUNTER NOTE  I connected with Molly Archer on 10/22/18 at  3:35 PM EDT by telephone at home and verified that I am speaking with the correct person using two identifiers.   I discussed the limitations, risks, security and privacy concerns of performing an evaluation and management service by telephone and the availability of in person appointments. I also discussed with the patient that there may be a patient responsible charge related to this service. The patient expressed understanding and agreed to proceed.   History:  Molly Archer is a 38 y.o. G0P0000 female being followed up today for AUB Reports decreased bleeding on Aygestin 10 mg daily, but still has frequent spotting-small amount of bleeding and wears pantyliners daily. Occasionally has associated pain.  She denies any current abnormal vaginal discharge, bleeding, pelvic pain or other concerns.       Past Medical History:  Diagnosis Date  . Anemia   . Asthma    only uses inhaler in fall and spring  . Constipation   . GERD (gastroesophageal reflux disease)    diet controlled, occ. uses omeprazole  . Headache    otc med prn  . History of kidney stones   . Hx of Bell's palsy June 2013  . Hypertension    currently no meds  . Menometrorrhagia   . Prediabetes   . Psoriasis   . Shortness of breath dyspnea    with exercise/exertion  . Vitamin D deficiency    Past Surgical History:  Procedure Laterality Date  . COMBINED HYSTEROSCOPY DIAGNOSTIC / D&C N/A 2011   Benign secretory endometrium  . CYSTOSCOPY WITH STENT PLACEMENT Left 10/19/2014   Procedure: CYSTOSCOPY WITH STENT PLACEMENT;  Surgeon: Ardis Hughs, MD;  Location: [facility];  Service: Urology;  Laterality: Left;  . DILATION AND CURETTAGE OF UTERUS  10/2003   Polyp, mild atypia, simple and complex hyperplasia,  . DILATION AND CURETTAGE OF UTERUS  11/2003   Simple and complex hyperplasia  . DILATION AND CURETTAGE OF  UTERUS  2011  . DILATION AND CURETTAGE OF UTERUS N/A 12/14/2014   Procedure: DILATATION AND CURETTAGE;  Surgeon: Mora Bellman, MD;  Location: [facility];  Service: Gynecology;  Laterality: N/A;  . HOLMIUM LASER APPLICATION Left 4/00/8676   Procedure: HOLMIUM LASER APPLICATION;  Surgeon: Ardis Hughs, MD;  Location: [facility];  Service: Urology;  Laterality: Left;  . HYSTEROSCOPY W/D&C N/A 07/21/2018   Procedure: DILATATION AND CURETTAGE /HYSTEROSCOPY;  Surgeon: Woodroe Mode, MD;  Location: [facility];  Service: Gynecology;  Laterality: N/A;  . INTRAUTERINE DEVICE (IUD) INSERTION N/A 12/14/2014   Procedure: INTRAUTERINE DEVICE (IUD) INSERTION;  Surgeon: Mora Bellman, MD;  Location: [facility];  Service: Gynecology;  Laterality: N/A;  . STONE EXTRACTION WITH BASKET Left 10/19/2014   Procedure: STONE EXTRACTION WITH BASKET;  Surgeon: Ardis Hughs, MD;  Location: [facility];  Service: Urology;  Laterality: Left;  . WISDOM TOOTH EXTRACTION     The following portions of the patient's history were reviewed and updated as appropriate: allergies, current medications, past family history, past medical history, past social history, past surgical history and problem list.   Health Maintenance:  Normal pap and negative HRHPV on 06/09/2018.   Review of Systems:  Pertinent items noted in HPI and remainder of comprehensive ROS otherwise negative.  Physical Exam:  Physical exam deferred due to nature of the encounter     Assessment and Plan:     1. Abnormal uterine  bleeding (AUB) Patient has decreased bleeding but still happening frequently.  Will increase dosage of Aygestin for now, and reevaluate. Bleeding precautions reviewed. If AUB is not controlled, will consider other management options. - norethindrone (AYGESTIN) 5 MG tablet; Take 3 tablets (15 mg total) by mouth daily.  Dispense: 180 tablet; Refill: 5     I discussed the assessment and treatment plan with the patient. The patient was provided an  opportunity to ask questions and all were answered. The patient agreed with the plan and demonstrated an understanding of the instructions.   The patient was advised to call back or seek an in-person evaluation/go to the ED if the symptoms worsen or if the condition fails to improve as anticipated.  I provided 15 minutes of non-face-to-face time during this encounter.   Verita Schneiders, MD [facility], [place]

## 2018-10-27 ENCOUNTER — Encounter (INDEPENDENT_AMBULATORY_CARE_PROVIDER_SITE_OTHER): Payer: Self-pay

## 2018-11-11 ENCOUNTER — Encounter (INDEPENDENT_AMBULATORY_CARE_PROVIDER_SITE_OTHER): Payer: Self-pay | Admitting: Family Medicine

## 2018-11-11 ENCOUNTER — Ambulatory Visit (INDEPENDENT_AMBULATORY_CARE_PROVIDER_SITE_OTHER): Payer: BLUE CROSS/BLUE SHIELD | Admitting: Family Medicine

## 2018-11-11 ENCOUNTER — Other Ambulatory Visit: Payer: Self-pay

## 2018-11-11 DIAGNOSIS — E559 Vitamin D deficiency, unspecified: Secondary | ICD-10-CM

## 2018-11-11 DIAGNOSIS — Z6841 Body Mass Index (BMI) 40.0 and over, adult: Secondary | ICD-10-CM

## 2018-11-11 DIAGNOSIS — I10 Essential (primary) hypertension: Secondary | ICD-10-CM | POA: Diagnosis not present

## 2018-11-11 DIAGNOSIS — R7303 Prediabetes: Secondary | ICD-10-CM | POA: Diagnosis not present

## 2018-11-11 MED ORDER — VITAMIN D (ERGOCALCIFEROL) 1.25 MG (50000 UNIT) PO CAPS: 50000.0000 [IU] | ORAL_CAPSULE | ORAL | 0 refills | Status: DC

## 2018-11-11 MED ORDER — AMLODIPINE BESYLATE 10 MG PO TABS: 10.0000 mg | ORAL_TABLET | Freq: Every day | ORAL | 0 refills | Status: DC

## 2018-11-11 NOTE — Progress Notes (Signed)
Office: [phone]  /  Fax: [phone] TeleHealth Visit:  Molly Archer has verbally consented to this TeleHealth visit today. The patient is located at home, the provider is located at the [facility] office. The participants in this visit include the listed provider and patient. The visit was conducted today via telephone call (patient unable to sign in to Skype).  HPI:   Chief Complaint: OBESITY Molly Archer is here to discuss her progress with her obesity treatment plan. She is on the Category 4 plan and is following her eating plan approximately 75% of the time. She states she is walking 20 minutes 4 times per week. Molly Archer states she does not have a scale or a blood pressure cuff. She reports snacking between meals and not eating whole meals. She does report her work hours are decreased. We were unable to weigh the patient today for this TeleHealth visit. She feels as if she has maintained her weight since her last visit. She has lost 11 lbs since starting treatment with [place].  Hypertension Molly Archer is a 38 y.o. female with hypertension which is moderately well controlled.  Molly Archer denies chest pain or shortness of breath on exertion. She is working weight loss to help control her blood pressure with the goal of decreasing her risk of heart attack and stroke. Molly Archer is unable to check her blood pressure at home. BP Readings from Last 3 Encounters:  09/24/18 140/84  09/24/18 140/77  09/15/18 (!) 141/89    Pre-Diabetes Molly Archer has a diagnosis of prediabetes based on her elevated Hgb A1c and was informed this puts her at greater risk of developing diabetes. Her last A1c level was reported to be 6.1 on 08/11/2018. She declined metformin and continues to work on diet and exercise to decrease risk of diabetes. She denies polyphagia. Lab Results  Component Value Date   HGBA1C 6.1 (H) 08/11/2018    Vitamin D deficiency Molly Archer has a diagnosis of  Vitamin D deficiency, which is not at goal. Her last Vitamin D level was reported to be very low at 7.6 on 08/11/2018. She is currently taking prescription Vit D and denies nausea, vomiting or muscle weakness.  ASSESSMENT AND PLAN:  Essential hypertension - Plan: amLODipine (NORVASC) 10 MG tablet  Prediabetes  Vitamin D deficiency - Plan: Vitamin D, Ergocalciferol, (DRISDOL) 1.25 MG (50000 UT) CAPS capsule  Class 3 severe obesity with serious comorbidity and body mass index (BMI) of 50.0 to 59.9 in adult, unspecified obesity type ([place])  PLAN:  Hypertension We discussed sodium restriction, working on healthy weight loss, and a regular exercise program as the means to achieve improved blood pressure control. Molly Archer agreed with this plan and agreed to follow up as directed. We will continue to monitor her blood pressure as well as her progress with the above lifestyle modifications. Molly Archer was given a refill on her amlodipine 10 mg daily #30 with no refills and agrees to follow-up with our clinic in 2 weeks.  Pre-Diabetes Molly Archer will continue to work on weight loss, exercise, and decreasing simple carbohydrates in her diet to help decrease the risk of diabetes. We dicussed metformin including benefits and risks. She was informed that eating too many simple carbohydrates or too many calories at one sitting increases the likelihood of GI side effects. Molly Archer declined metformin for now and a prescription was not written today. Molly Archer will continue her meal plan and agreed to follow-up with [place] as directed to monitor her progress.  Vitamin D Deficiency Molly Archer was informed that low Vitamin D levels contributes to fatigue and are associated with obesity, breast, and colon cancer. She agrees to continue to take prescription Vit D @ 50,000 IU every week #4 with 0 refills and will follow-up for routine testing of Vitamin D, at least 2-3 times per year. She was informed of the risk of over-replacement  of Vitamin D and agrees to not increase her dose unless she discusses this with [place] first. Molly Archer agrees to follow-up with our clinic in 2 weeks.  Obesity Molly Archer is currently in the action stage of change. As such, her goal is to continue with weight loss efforts. She has agreed to follow the Category 4 plan. She will stop skipping meals and decrease her snacking. Molly Archer has been instructed to continue her current exercise regimen for weight loss and overall health benefits. We discussed the following Behavioral Modification Strategies today: increasing lean protein intake, decreasing simple carbohydrates, no skipping meals, better snacking choices, and planning for success.  Molly Archer has agreed to follow-up with our clinic in 2 weeks. She was informed of the importance of frequent follow up visits to maximize her success with intensive lifestyle modifications for her multiple health conditions.  ALLERGIES: Allergies  Allergen Reactions   Codeine Itching   Peanut-Containing Drug Products Itching   Penicillins Hives    Has patient had a PCN reaction causing immediate rash, facial/tongue/throat swelling, SOB or lightheadedness with hypotension: No Has patient had a PCN reaction causing severe rash involving mucus membranes or skin necrosis: No Has patient had a PCN reaction that required hospitalization No Has patient had a PCN reaction occurring within the last 10 years: No If all of the above answers are "NO", then may proceed with Cephalosporin use.    Shellfish Allergy Itching    MEDICATIONS: Current Outpatient Medications on File Prior to Visit  Medication Sig Dispense Refill   albuterol (PROVENTIL HFA;VENTOLIN HFA) 108 (90 Base) MCG/ACT inhaler Inhale 2 puffs into the lungs every 4 (four) hours as needed for wheezing or shortness of breath (cough, shortness of breath or wheezing.). 1 Inhaler 1   diclofenac (VOLTAREN) 75 MG EC tablet Take 1 tablet (75 mg total) by mouth 2  (two) times daily with a meal. 60 tablet 0   ferrous gluconate (FERGON) 324 MG tablet Take 1 tablet (324 mg total) by mouth 2 (two) times daily with a meal. 60 tablet 0   ibuprofen (ADVIL,MOTRIN) 800 MG tablet Take 1 tablet (800 mg total) by mouth every 8 (eight) hours as needed. 60 tablet 1   norethindrone (AYGESTIN) 5 MG tablet Take 3 tablets (15 mg total) by mouth daily. 180 tablet 5   oxyCODONE-acetaminophen (PERCOCET/ROXICET) 5-325 MG tablet Take 1 tablet by mouth every 6 (six) hours as needed for moderate pain. 10 tablet 0   polyethylene glycol powder (MIRALAX) powder Take 1 Container by mouth once.     triamcinolone cream (KENALOG) 0.1 % Apply 1 application topically 2 (two) times daily. 30 g 0   No current facility-administered medications on file prior to visit.     PAST MEDICAL HISTORY: Past Medical History:  Diagnosis Date   Anemia    Asthma    only uses inhaler in fall and spring   Constipation    GERD (gastroesophageal reflux disease)    diet controlled, occ. uses omeprazole   Headache    otc med prn   History of kidney stones    Hx of Bell's palsy June 2013  Hypertension    currently no meds   Menometrorrhagia    Prediabetes    Psoriasis    Shortness of breath dyspnea    with exercise/exertion   Vitamin D deficiency     PAST SURGICAL HISTORY: Past Surgical History:  Procedure Laterality Date   COMBINED HYSTEROSCOPY DIAGNOSTIC / D&C N/A 2011   Benign secretory endometrium   CYSTOSCOPY WITH STENT PLACEMENT Left 10/19/2014   Procedure: CYSTOSCOPY WITH STENT PLACEMENT;  Surgeon: Ardis Hughs, MD;  Location: [facility];  Service: Urology;  Laterality: Left;   DILATION AND CURETTAGE OF UTERUS  10/2003   Polyp, mild atypia, simple and complex hyperplasia,   DILATION AND CURETTAGE OF UTERUS  11/2003   Simple and complex hyperplasia   DILATION AND CURETTAGE OF UTERUS  2011   DILATION AND CURETTAGE OF UTERUS N/A 12/14/2014   Procedure:  DILATATION AND CURETTAGE;  Surgeon: Mora Bellman, MD;  Location: [facility];  Service: Gynecology;  Laterality: N/A;   HOLMIUM LASER APPLICATION Left 8/93/7342   Procedure: HOLMIUM LASER APPLICATION;  Surgeon: Ardis Hughs, MD;  Location: [facility];  Service: Urology;  Laterality: Left;   HYSTEROSCOPY W/D&C N/A 07/21/2018   Procedure: DILATATION AND CURETTAGE /HYSTEROSCOPY;  Surgeon: Woodroe Mode, MD;  Location: [facility];  Service: Gynecology;  Laterality: N/A;   INTRAUTERINE DEVICE (IUD) INSERTION N/A 12/14/2014   Procedure: INTRAUTERINE DEVICE (IUD) INSERTION;  Surgeon: Mora Bellman, MD;  Location: [facility];  Service: Gynecology;  Laterality: N/A;   STONE EXTRACTION WITH BASKET Left 10/19/2014   Procedure: STONE EXTRACTION WITH BASKET;  Surgeon: Ardis Hughs, MD;  Location: [facility];  Service: Urology;  Laterality: Left;   WISDOM TOOTH EXTRACTION      SOCIAL HISTORY: Social History   Tobacco Use   Smoking status: Never Smoker   Smokeless tobacco: Never Used  Substance Use Topics   Alcohol use: No   Drug use: No    FAMILY HISTORY: Family History  Problem Relation Age of Onset   Hypertension Other    Diabetes Other    Cancer Other    Hypertension Mother    Hyperlipidemia Mother    Hypertension Father    ROS: Review of Systems  Respiratory: Negative for shortness of breath.   Cardiovascular: Negative for chest pain.  Gastrointestinal: Negative for nausea and vomiting.  Musculoskeletal:       Negative for muscle weakness.  Endo/Heme/Allergies:       Negative for polyphagia.   PHYSICAL EXAM: Pt in no acute distress  RECENT LABS AND TESTS: BMET    Component Value Date/Time   NA 141 08/11/2018 1320   K 4.4 08/11/2018 1320   CL 104 08/11/2018 1320   CO2 21 08/11/2018 1320   GLUCOSE 78 08/11/2018 1320   GLUCOSE 109 (H) 07/21/2018 0555   BUN 10 08/11/2018 1320   CREATININE 0.60 08/11/2018 1320   CALCIUM 9.3 08/11/2018 1320   GFRNONAA 118  08/11/2018 1320   GFRAA 136 08/11/2018 1320   Lab Results  Component Value Date   HGBA1C 6.1 (H) 08/11/2018   HGBA1C 5.7 (H) 04/17/2018   Lab Results  Component Value Date   INSULIN 38.4 (H) 08/11/2018   CBC    Component Value Date/Time   WBC 7.0 08/11/2018 1320   WBC 7.7 07/24/2018 0238   RBC 4.24 08/11/2018 1320   RBC 4.20 07/24/2018 0238   HGB 8.5 (L) 08/11/2018 1320   HCT 29.5 (L) 08/11/2018 1320   PLT 369 07/24/2018 0238  PLT 451 (H) 06/09/2018 1743   MCV 70 (L) 08/11/2018 1320   MCH 20.0 (L) 08/11/2018 1320   MCH 20.2 (L) 07/24/2018 0238   MCHC 28.8 (L) 08/11/2018 1320   MCHC 27.6 (L) 07/24/2018 0238   RDW 15.9 (H) 08/11/2018 1320   LYMPHSABS 1.8 08/11/2018 1320   MONOABS 0.6 07/24/2018 0238   EOSABS 0.4 08/11/2018 1320   BASOSABS 0.1 08/11/2018 1320   Iron/TIBC/Ferritin/ %Sat No results found for: IRON, TIBC, FERRITIN, IRONPCTSAT Lipid Panel     Component Value Date/Time   CHOL 155 08/11/2018 1320   TRIG 86 08/11/2018 1320   HDL 29 (L) 08/11/2018 1320   CHOLHDL 4.2 04/17/2018 1354   CHOLHDL 5.3 Ratio 03/25/2009 2040   VLDL 21 03/25/2009 2040   LDLCALC 109 (H) 08/11/2018 1320   Hepatic Function Panel     Component Value Date/Time   PROT 7.0 08/11/2018 1320   ALBUMIN 4.7 08/11/2018 1320   AST 12 08/11/2018 1320   ALT 23 08/11/2018 1320   ALKPHOS 47 08/11/2018 1320   BILITOT 0.3 08/11/2018 1320      Component Value Date/Time   TSH 1.200 08/11/2018 1320   TSH 0.615 07/20/2018 2307   TSH 2.300 04/17/2018 1354   TSH 1.403 10/21/2013 1634   Results for TWYLA, DAIS (MRN [id number]) as of 11/11/2018 17:14  Ref. Range 08/11/2018 13:20  Vitamin D, 25-Hydroxy Latest Ref Range: 30.0 - 100.0 ng/mL 7.6 (L)   I, Michaelene Song, am acting as Location manager for [employer], FNP-C.  I have reviewed the above documentation for accuracy and completeness, and I agree with the above.  - Elleah Hemsley, FNP-C.

## 2018-11-12 ENCOUNTER — Encounter (INDEPENDENT_AMBULATORY_CARE_PROVIDER_SITE_OTHER): Payer: Self-pay | Admitting: Family Medicine

## 2018-11-12 DIAGNOSIS — R7303 Prediabetes: Secondary | ICD-10-CM | POA: Insufficient documentation

## 2018-11-26 ENCOUNTER — Other Ambulatory Visit: Payer: Self-pay

## 2018-11-26 ENCOUNTER — Encounter (INDEPENDENT_AMBULATORY_CARE_PROVIDER_SITE_OTHER): Payer: Self-pay | Admitting: Family Medicine

## 2018-11-26 ENCOUNTER — Ambulatory Visit (INDEPENDENT_AMBULATORY_CARE_PROVIDER_SITE_OTHER): Payer: BLUE CROSS/BLUE SHIELD | Admitting: Family Medicine

## 2018-11-26 DIAGNOSIS — Z6841 Body Mass Index (BMI) 40.0 and over, adult: Secondary | ICD-10-CM

## 2018-11-26 DIAGNOSIS — R7303 Prediabetes: Secondary | ICD-10-CM | POA: Diagnosis not present

## 2018-11-26 DIAGNOSIS — E559 Vitamin D deficiency, unspecified: Secondary | ICD-10-CM | POA: Diagnosis not present

## 2018-11-26 DIAGNOSIS — E66813 Obesity, class 3: Secondary | ICD-10-CM

## 2018-11-26 MED ORDER — VITAMIN D (ERGOCALCIFEROL) 1.25 MG (50000 UNIT) PO CAPS: 50000.0000 [IU] | ORAL_CAPSULE | ORAL | 0 refills | Status: DC

## 2018-11-26 NOTE — Progress Notes (Signed)
Office: [phone]  /  Fax: [phone] TeleHealth Visit:  Molly Archer has verbally consented to this TeleHealth visit today. The patient is located at work, the provider is located at the [employer] [facility] office. The participants in this visit include the listed provider and patient. The visit was conducted today via Webex.  HPI:   Chief Complaint: OBESITY Molly Archer is here to discuss her progress with her obesity treatment plan. She is on the Category 4 plan and is following her eating plan approximately 90% of the time. She states she is walking 20 minutes 3 times per week. Donnisha thinks she has gained about 2 lbs. She struggles at dinner and sometimes does not eat all of her protein. She does state she tends to stay on the plan at breakfast and lunch.  We were unable to weigh the patient today for this TeleHealth visit. She feels as if she has gained 2 lbs since her last visit. She has lost 11 lbs since starting treatment with [place].  Vitamin D deficiency Molly Archer has a diagnosis of Vitamin D deficiency. Her last Vitamin D level was very low at 7.6 on 08/11/2018. She is currently taking prescription Vit D and denies nausea, vomiting or muscle weakness.  Pre-Diabetes Molly Archer has a diagnosis of prediabetes based on her elevated Hgb A1c and was informed this puts her at greater risk of developing diabetes. She is not taking metformin currently and continues to work on diet and exercise to decrease risk of diabetes. She denies polyphagia. Lab Results  Component Value Date   HGBA1C 6.1 (H) 08/11/2018    ASSESSMENT AND PLAN:  Vitamin D deficiency - Plan: Vitamin D, Ergocalciferol, (DRISDOL) 1.25 MG (50000 UT) CAPS capsule  Prediabetes  Class 3 severe obesity with serious comorbidity and body mass index (BMI) of 50.0 to 59.9 in adult, unspecified obesity type ([place])  PLAN:  Vitamin D Deficiency Molly Archer was informed that low Vitamin D levels contributes to fatigue and are  associated with obesity, breast, and colon cancer. She agrees to continue to take prescription Vit D @ 50,000 IU every week #4 with 0 refills and will follow-up for routine testing of Vitamin D, at least 2-3 times per year. She was informed of the risk of over-replacement of Vitamin D and agrees to not increase her dose unless she discusses this with [place] first. Molly Archer agrees to follow-up with our clinic in 2 weeks.  Pre-Diabetes Molly Archer will continue to work on weight loss, exercise, and decreasing simple carbohydrates in her diet to help decrease the risk of diabetes. Molly Archer will continue her meal plan and agrees to follow-up with [place] as directed to monitor her progress.  Obesity Molly Archer is currently in the action stage of change. As such, her goal is to continue with weight loss efforts. She has agreed to follow the Category 4 plan and journal 550-700 calories and 45+ grams of protein at supper. Handouts were sent to the patient via MyChart on Journaling, Protein Content of Foods, and Dining Out. Molly Archer has been instructed to work up to a goal of 150 minutes of combined cardio and strengthening exercise per week for weight loss and overall health benefits. We discussed the following Behavioral Modification Strategies today: increasing lean protein intake, work on meal planning and easy cooking plans, planning for success, and keep a strict food journal.  Molly Archer has agreed to follow-up with our clinic in 2 weeks. She was informed of the importance of frequent follow-up visits to maximize her  success with intensive lifestyle modifications for her multiple health conditions.  ALLERGIES: Allergies  Allergen Reactions  . Codeine Itching  . Peanut-Containing Drug Products Itching  . Penicillins Hives    Has patient had a PCN reaction causing immediate rash, facial/tongue/throat swelling, SOB or lightheadedness with hypotension: No Has patient had a PCN reaction causing severe rash involving mucus  membranes or skin necrosis: No Has patient had a PCN reaction that required hospitalization No Has patient had a PCN reaction occurring within the last 10 years: No If all of the above answers are "NO", then may proceed with Cephalosporin use.   . Shellfish Allergy Itching    MEDICATIONS: Current Outpatient Medications on File Prior to Visit  Medication Sig Dispense Refill  . albuterol (PROVENTIL HFA;VENTOLIN HFA) 108 (90 Base) MCG/ACT inhaler Inhale 2 puffs into the lungs every 4 (four) hours as needed for wheezing or shortness of breath (cough, shortness of breath or wheezing.). 1 Inhaler 1  . amLODipine (NORVASC) 10 MG tablet Take 1 tablet (10 mg total) by mouth daily. 30 tablet 0  . diclofenac (VOLTAREN) 75 MG EC tablet Take 1 tablet (75 mg total) by mouth 2 (two) times daily with a meal. 60 tablet 0  . ferrous gluconate (FERGON) 324 MG tablet Take 1 tablet (324 mg total) by mouth 2 (two) times daily with a meal. 60 tablet 0  . ibuprofen (ADVIL,MOTRIN) 800 MG tablet Take 1 tablet (800 mg total) by mouth every 8 (eight) hours as needed. 60 tablet 1  . norethindrone (AYGESTIN) 5 MG tablet Take 3 tablets (15 mg total) by mouth daily. 180 tablet 5  . oxyCODONE-acetaminophen (PERCOCET/ROXICET) 5-325 MG tablet Take 1 tablet by mouth every 6 (six) hours as needed for moderate pain. 10 tablet 0  . polyethylene glycol powder (MIRALAX) powder Take 1 Container by mouth once.    . triamcinolone cream (KENALOG) 0.1 % Apply 1 application topically 2 (two) times daily. 30 g 0   No current facility-administered medications on file prior to visit.     PAST MEDICAL HISTORY: Past Medical History:  Diagnosis Date  . Anemia   . Asthma    only uses inhaler in fall and spring  . Constipation   . GERD (gastroesophageal reflux disease)    diet controlled, occ. uses omeprazole  . Headache    otc med prn  . History of kidney stones   . Hx of Bell's palsy June 2013  . Hypertension    currently no meds   . Menometrorrhagia   . Prediabetes   . Psoriasis   . Shortness of breath dyspnea    with exercise/exertion  . Vitamin D deficiency     PAST SURGICAL HISTORY: Past Surgical History:  Procedure Laterality Date  . COMBINED HYSTEROSCOPY DIAGNOSTIC / D&C N/A 2011   Benign secretory endometrium  . CYSTOSCOPY WITH STENT PLACEMENT Left 10/19/2014   Procedure: CYSTOSCOPY WITH STENT PLACEMENT;  Surgeon: Ardis Hughs, MD;  Location: [facility];  Service: Urology;  Laterality: Left;  . DILATION AND CURETTAGE OF UTERUS  10/2003   Polyp, mild atypia, simple and complex hyperplasia,  . DILATION AND CURETTAGE OF UTERUS  11/2003   Simple and complex hyperplasia  . DILATION AND CURETTAGE OF UTERUS  2011  . DILATION AND CURETTAGE OF UTERUS N/A 12/14/2014   Procedure: DILATATION AND CURETTAGE;  Surgeon: Mora Bellman, MD;  Location: [facility];  Service: Gynecology;  Laterality: N/A;  . HOLMIUM LASER APPLICATION Left 2/37/6283   Procedure: HOLMIUM LASER  APPLICATION;  Surgeon: Ardis Hughs, MD;  Location: [facility];  Service: Urology;  Laterality: Left;  . HYSTEROSCOPY W/D&C N/A 07/21/2018   Procedure: DILATATION AND CURETTAGE /HYSTEROSCOPY;  Surgeon: Woodroe Mode, MD;  Location: [facility];  Service: Gynecology;  Laterality: N/A;  . INTRAUTERINE DEVICE (IUD) INSERTION N/A 12/14/2014   Procedure: INTRAUTERINE DEVICE (IUD) INSERTION;  Surgeon: Mora Bellman, MD;  Location: [facility];  Service: Gynecology;  Laterality: N/A;  . STONE EXTRACTION WITH BASKET Left 10/19/2014   Procedure: STONE EXTRACTION WITH BASKET;  Surgeon: Ardis Hughs, MD;  Location: [facility];  Service: Urology;  Laterality: Left;  . WISDOM TOOTH EXTRACTION      SOCIAL HISTORY: Social History   Tobacco Use  . Smoking status: Never Smoker  . Smokeless tobacco: Never Used  Substance Use Topics  . Alcohol use: No  . Drug use: No    FAMILY HISTORY: Family History  Problem Relation Age of Onset  . Hypertension Other   . Diabetes  Other   . Cancer Other   . Hypertension Mother   . Hyperlipidemia Mother   . Hypertension Father    ROS: Review of Systems  Gastrointestinal: Negative for nausea and vomiting.  Musculoskeletal:       Negative for muscle weakness.  Endo/Heme/Allergies:       Negative for polyphagia.   PHYSICAL EXAM: Pt in no acute distress  RECENT LABS AND TESTS: BMET    Component Value Date/Time   NA 141 08/11/2018 1320   K 4.4 08/11/2018 1320   CL 104 08/11/2018 1320   CO2 21 08/11/2018 1320   GLUCOSE 78 08/11/2018 1320   GLUCOSE 109 (H) 07/21/2018 0555   BUN 10 08/11/2018 1320   CREATININE 0.60 08/11/2018 1320   CALCIUM 9.3 08/11/2018 1320   GFRNONAA 118 08/11/2018 1320   GFRAA 136 08/11/2018 1320   Lab Results  Component Value Date   HGBA1C 6.1 (H) 08/11/2018   HGBA1C 5.7 (H) 04/17/2018   Lab Results  Component Value Date   INSULIN 38.4 (H) 08/11/2018   CBC    Component Value Date/Time   WBC 7.0 08/11/2018 1320   WBC 7.7 07/24/2018 0238   RBC 4.24 08/11/2018 1320   RBC 4.20 07/24/2018 0238   HGB 8.5 (L) 08/11/2018 1320   HCT 29.5 (L) 08/11/2018 1320   PLT 369 07/24/2018 0238   PLT 451 (H) 06/09/2018 1743   MCV 70 (L) 08/11/2018 1320   MCH 20.0 (L) 08/11/2018 1320   MCH 20.2 (L) 07/24/2018 0238   MCHC 28.8 (L) 08/11/2018 1320   MCHC 27.6 (L) 07/24/2018 0238   RDW 15.9 (H) 08/11/2018 1320   LYMPHSABS 1.8 08/11/2018 1320   MONOABS 0.6 07/24/2018 0238   EOSABS 0.4 08/11/2018 1320   BASOSABS 0.1 08/11/2018 1320   Iron/TIBC/Ferritin/ %Sat No results found for: IRON, TIBC, FERRITIN, IRONPCTSAT Lipid Panel     Component Value Date/Time   CHOL 155 08/11/2018 1320   TRIG 86 08/11/2018 1320   HDL 29 (L) 08/11/2018 1320   CHOLHDL 4.2 04/17/2018 1354   CHOLHDL 5.3 Ratio 03/25/2009 2040   VLDL 21 03/25/2009 2040   LDLCALC 109 (H) 08/11/2018 1320   Hepatic Function Panel     Component Value Date/Time   PROT 7.0 08/11/2018 1320   ALBUMIN 4.7 08/11/2018 1320   AST  12 08/11/2018 1320   ALT 23 08/11/2018 1320   ALKPHOS 47 08/11/2018 1320   BILITOT 0.3 08/11/2018 1320      Component  Value Date/Time   TSH 1.200 08/11/2018 1320   TSH 0.615 07/20/2018 2307   TSH 2.300 04/17/2018 1354   TSH 1.403 10/21/2013 1634   Results for FAIZAH, KANDLER (MRN [id number]) as of 11/26/2018 15:55  Ref. Range 08/11/2018 13:20  Vitamin D, 25-Hydroxy Latest Ref Range: 30.0 - 100.0 ng/mL 7.6 (L)   I, Michaelene Song, am acting as Location manager for [employer], FNP-C.  I have reviewed the above documentation for accuracy and completeness, and I agree with the above.  -  , FNP-C.

## 2018-11-27 ENCOUNTER — Encounter (INDEPENDENT_AMBULATORY_CARE_PROVIDER_SITE_OTHER): Payer: Self-pay | Admitting: Family Medicine

## 2018-12-08 ENCOUNTER — Encounter: Payer: Self-pay | Admitting: Physician Assistant

## 2018-12-10 ENCOUNTER — Ambulatory Visit (INDEPENDENT_AMBULATORY_CARE_PROVIDER_SITE_OTHER): Payer: BLUE CROSS/BLUE SHIELD | Admitting: Family Medicine

## 2018-12-10 ENCOUNTER — Other Ambulatory Visit: Payer: Self-pay

## 2018-12-10 DIAGNOSIS — Z6841 Body Mass Index (BMI) 40.0 and over, adult: Secondary | ICD-10-CM

## 2018-12-10 DIAGNOSIS — R7303 Prediabetes: Secondary | ICD-10-CM

## 2018-12-10 DIAGNOSIS — D508 Other iron deficiency anemias: Secondary | ICD-10-CM

## 2018-12-10 MED ORDER — FERROUS GLUCONATE 324 (38 FE) MG PO TABS: 324.0000 mg | ORAL_TABLET | Freq: Two times a day (BID) | ORAL | 0 refills | Status: DC

## 2018-12-11 ENCOUNTER — Encounter (INDEPENDENT_AMBULATORY_CARE_PROVIDER_SITE_OTHER): Payer: Self-pay | Admitting: Family Medicine

## 2018-12-11 NOTE — Progress Notes (Signed)
Office: [phone]  /  Fax: [phone] TeleHealth Visit:  Molly Archer has verbally consented to this TeleHealth visit today. The patient is located at work, the provider is located at the [employer] [facility] office. The participants in this visit include the listed provider and patient and any and all parties involved. The visit was conducted today via WebEx.  HPI:   Chief Complaint: OBESITY Molly Archer is here to discuss her progress with her obesity treatment plan. She is on the Category 4 plan and she is journaling 550 to 700 calories and 45+ grams of protein at dinner daily. She is following her eating plan approximately 90 % of the time. She states she is walking 20 minutes 3 times per week. Molly Archer is journaling at dinner. She is sticking to the plan very well. Molly Archer does not have a scale at home and she is unable to weigh. We were unable to weigh the patient today for this TeleHealth visit. She feels as if she has maintained weight since her last visit. She has lost 11 lbs since starting treatment with [place].  Pre-Diabetes Molly Archer has a diagnosis of prediabetes based on her elevated Hgb A1c and was informed this puts her at greater risk of developing diabetes. She is not taking metformin currently and continues to work on diet and exercise to decrease risk of diabetes. She denies polyphagia. Lab Results  Component Value Date   HGBA1C 6.1 (H) 08/11/2018   Iron Deficiency Anemia Molly Archer has a diagnosis of anemia and has been taking RX iron supplementation.  She has very heavy periods. Molly Archer is monitored by her GYN. She is on OCP for this.  ASSESSMENT AND PLAN:  Prediabetes  Other iron deficiency anemia - Plan: ferrous gluconate (FERGON) 324 MG tablet  Class 3 severe obesity with serious comorbidity and body mass index (BMI) of 50.0 to 59.9 in adult, unspecified obesity type ([place])  PLAN:  Pre-Diabetes Molly Archer will continue to work on weight loss, exercise, and  decreasing simple carbohydrates in her diet to help decrease the risk of diabetes. Molly Archer declines metformin. Molly Archer will continue with the meal plan and follow up with [place] as directed to monitor her progress.  Iron Deficiency Anemia Molly Archer agrees to continue to take Fergon 324 mg BID with meals #60 with no refills and follow up as directed.  Obesity Molly Archer is currently in the action stage of change. As such, her goal is to continue with weight loss efforts She has agreed to keep a food journal with 550 to 700 calories and 45+ grams of protein at dinner daily and follow the Category 3 plan Molly Archer has been instructed to work up to a goal of 150 minutes of combined cardio and strengthening exercise per week for weight loss and overall health benefits. We discussed the following Behavioral Modification Strategies today: planning for success, keep a strict food journal and increasing lean protein intake  Molly Archer has agreed to follow up with our clinic in 2 weeks. She was informed of the importance of frequent follow up visits to maximize her success with intensive lifestyle modifications for her multiple health conditions.  ALLERGIES: Allergies  Allergen Reactions  . Codeine Itching  . Peanut-Containing Drug Products Itching  . Penicillins Hives    Has patient had a PCN reaction causing immediate rash, facial/tongue/throat swelling, SOB or lightheadedness with hypotension: No Has patient had a PCN reaction causing severe rash involving mucus membranes or skin necrosis: No Has patient had a PCN reaction that required  hospitalization No Has patient had a PCN reaction occurring within the last 10 years: No If all of the above answers are "NO", then may proceed with Cephalosporin use.   . Shellfish Allergy Itching    MEDICATIONS: Current Outpatient Medications on File Prior to Visit  Medication Sig Dispense Refill  . albuterol (PROVENTIL HFA;VENTOLIN HFA) 108 (90 Base) MCG/ACT inhaler  Inhale 2 puffs into the lungs every 4 (four) hours as needed for wheezing or shortness of breath (cough, shortness of breath or wheezing.). 1 Inhaler 1  . amLODipine (NORVASC) 10 MG tablet Take 1 tablet (10 mg total) by mouth daily. 30 tablet 0  . diclofenac (VOLTAREN) 75 MG EC tablet Take 1 tablet (75 mg total) by mouth 2 (two) times daily with a meal. 60 tablet 0  . ibuprofen (ADVIL,MOTRIN) 800 MG tablet Take 1 tablet (800 mg total) by mouth every 8 (eight) hours as needed. 60 tablet 1  . norethindrone (AYGESTIN) 5 MG tablet Take 3 tablets (15 mg total) by mouth daily. 180 tablet 5  . oxyCODONE-acetaminophen (PERCOCET/ROXICET) 5-325 MG tablet Take 1 tablet by mouth every 6 (six) hours as needed for moderate pain. 10 tablet 0  . polyethylene glycol powder (MIRALAX) powder Take 1 Container by mouth once.    . triamcinolone cream (KENALOG) 0.1 % Apply 1 application topically 2 (two) times daily. 30 g 0  . Vitamin D, Ergocalciferol, (DRISDOL) 1.25 MG (50000 UT) CAPS capsule Take 1 capsule (50,000 Units total) by mouth every 7 (seven) days. 4 capsule 0   No current facility-administered medications on file prior to visit.     PAST MEDICAL HISTORY: Past Medical History:  Diagnosis Date  . Anemia   . Asthma    only uses inhaler in fall and spring  . Constipation   . GERD (gastroesophageal reflux disease)    diet controlled, occ. uses omeprazole  . Headache    otc med prn  . History of kidney stones   . Hx of Bell's palsy June 2013  . Hypertension    currently no meds  . Menometrorrhagia   . Prediabetes   . Psoriasis   . Shortness of breath dyspnea    with exercise/exertion  . Vitamin D deficiency     PAST SURGICAL HISTORY: Past Surgical History:  Procedure Laterality Date  . COMBINED HYSTEROSCOPY DIAGNOSTIC / D&C N/A 2011   Benign secretory endometrium  . CYSTOSCOPY WITH STENT PLACEMENT Left 10/19/2014   Procedure: CYSTOSCOPY WITH STENT PLACEMENT;  Surgeon: Ardis Hughs, MD;   Location: [facility];  Service: Urology;  Laterality: Left;  . DILATION AND CURETTAGE OF UTERUS  10/2003   Polyp, mild atypia, simple and complex hyperplasia,  . DILATION AND CURETTAGE OF UTERUS  11/2003   Simple and complex hyperplasia  . DILATION AND CURETTAGE OF UTERUS  2011  . DILATION AND CURETTAGE OF UTERUS N/A 12/14/2014   Procedure: DILATATION AND CURETTAGE;  Surgeon: Mora Bellman, MD;  Location: [facility];  Service: Gynecology;  Laterality: N/A;  . HOLMIUM LASER APPLICATION Left 11/15/6220   Procedure: HOLMIUM LASER APPLICATION;  Surgeon: Ardis Hughs, MD;  Location: [facility];  Service: Urology;  Laterality: Left;  . HYSTEROSCOPY W/D&C N/A 07/21/2018   Procedure: DILATATION AND CURETTAGE /HYSTEROSCOPY;  Surgeon: Woodroe Mode, MD;  Location: [facility];  Service: Gynecology;  Laterality: N/A;  . INTRAUTERINE DEVICE (IUD) INSERTION N/A 12/14/2014   Procedure: INTRAUTERINE DEVICE (IUD) INSERTION;  Surgeon: Mora Bellman, MD;  Location: [facility];  Service: Gynecology;  Laterality: N/A;  . STONE EXTRACTION WITH BASKET Left 10/19/2014   Procedure: STONE EXTRACTION WITH BASKET;  Surgeon: Ardis Hughs, MD;  Location: [facility];  Service: Urology;  Laterality: Left;  . WISDOM TOOTH EXTRACTION      SOCIAL HISTORY: Social History   Tobacco Use  . Smoking status: Never Smoker  . Smokeless tobacco: Never Used  Substance Use Topics  . Alcohol use: No  . Drug use: No    FAMILY HISTORY: Family History  Problem Relation Age of Onset  . Hypertension Other   . Diabetes Other   . Cancer Other   . Hypertension Mother   . Hyperlipidemia Mother   . Hypertension Father     ROS: Review of Systems  Constitutional: Negative for weight loss.  Endo/Heme/Allergies:       Negative for polyphagia    PHYSICAL EXAM: Pt in no acute distress  RECENT LABS AND TESTS: BMET    Component Value Date/Time   NA 141 08/11/2018 1320   K 4.4 08/11/2018 1320   CL 104 08/11/2018 1320   CO2 21 08/11/2018  1320   GLUCOSE 78 08/11/2018 1320   GLUCOSE 109 (H) 07/21/2018 0555   BUN 10 08/11/2018 1320   CREATININE 0.60 08/11/2018 1320   CALCIUM 9.3 08/11/2018 1320   GFRNONAA 118 08/11/2018 1320   GFRAA 136 08/11/2018 1320   Lab Results  Component Value Date   HGBA1C 6.1 (H) 08/11/2018   HGBA1C 5.7 (H) 04/17/2018   Lab Results  Component Value Date   INSULIN 38.4 (H) 08/11/2018   CBC    Component Value Date/Time   WBC 7.0 08/11/2018 1320   WBC 7.7 07/24/2018 0238   RBC 4.24 08/11/2018 1320   RBC 4.20 07/24/2018 0238   HGB 8.5 (L) 08/11/2018 1320   HCT 29.5 (L) 08/11/2018 1320   PLT 369 07/24/2018 0238   PLT 451 (H) 06/09/2018 1743   MCV 70 (L) 08/11/2018 1320   MCH 20.0 (L) 08/11/2018 1320   MCH 20.2 (L) 07/24/2018 0238   MCHC 28.8 (L) 08/11/2018 1320   MCHC 27.6 (L) 07/24/2018 0238   RDW 15.9 (H) 08/11/2018 1320   LYMPHSABS 1.8 08/11/2018 1320   MONOABS 0.6 07/24/2018 0238   EOSABS 0.4 08/11/2018 1320   BASOSABS 0.1 08/11/2018 1320   Iron/TIBC/Ferritin/ %Sat No results found for: IRON, TIBC, FERRITIN, IRONPCTSAT Lipid Panel     Component Value Date/Time   CHOL 155 08/11/2018 1320   TRIG 86 08/11/2018 1320   HDL 29 (L) 08/11/2018 1320   CHOLHDL 4.2 04/17/2018 1354   CHOLHDL 5.3 Ratio 03/25/2009 2040   VLDL 21 03/25/2009 2040   LDLCALC 109 (H) 08/11/2018 1320   Hepatic Function Panel     Component Value Date/Time   PROT 7.0 08/11/2018 1320   ALBUMIN 4.7 08/11/2018 1320   AST 12 08/11/2018 1320   ALT 23 08/11/2018 1320   ALKPHOS 47 08/11/2018 1320   BILITOT 0.3 08/11/2018 1320      Component Value Date/Time   TSH 1.200 08/11/2018 1320   TSH 0.615 07/20/2018 2307   TSH 2.300 04/17/2018 1354   TSH 1.403 10/21/2013 1634     Ref. Range 08/11/2018 13:20  Vitamin D, 25-Hydroxy Latest Ref Range: 30.0 - 100.0 ng/mL 7.6 (L)    I, Doreene Nest, am acting as Location manager for [employer], FNP-C.  I have reviewed the above documentation for accuracy and  completeness, and I agree with the above.  -  , FNP-C.

## 2018-12-18 DIAGNOSIS — L309 Dermatitis, unspecified: Secondary | ICD-10-CM | POA: Insufficient documentation

## 2018-12-24 ENCOUNTER — Other Ambulatory Visit: Payer: Self-pay

## 2018-12-24 ENCOUNTER — Ambulatory Visit (INDEPENDENT_AMBULATORY_CARE_PROVIDER_SITE_OTHER): Payer: BLUE CROSS/BLUE SHIELD | Admitting: Family Medicine

## 2018-12-24 ENCOUNTER — Encounter (INDEPENDENT_AMBULATORY_CARE_PROVIDER_SITE_OTHER): Payer: Self-pay | Admitting: Family Medicine

## 2018-12-24 DIAGNOSIS — Z6841 Body Mass Index (BMI) 40.0 and over, adult: Secondary | ICD-10-CM

## 2018-12-24 DIAGNOSIS — E559 Vitamin D deficiency, unspecified: Secondary | ICD-10-CM | POA: Diagnosis not present

## 2018-12-24 DIAGNOSIS — I10 Essential (primary) hypertension: Secondary | ICD-10-CM | POA: Diagnosis not present

## 2018-12-24 MED ORDER — VITAMIN D (ERGOCALCIFEROL) 1.25 MG (50000 UNIT) PO CAPS: 50000.0000 [IU] | ORAL_CAPSULE | ORAL | 0 refills | Status: DC

## 2018-12-24 MED ORDER — AMLODIPINE BESYLATE 10 MG PO TABS: 10.0000 mg | ORAL_TABLET | Freq: Every day | ORAL | 0 refills | Status: DC

## 2018-12-24 NOTE — Progress Notes (Signed)
Office: [phone]  /  Fax: [phone] TeleHealth Visit:  Molly Archer has verbally consented to this TeleHealth visit today. The patient is located at work, the provider is located at the [employer] [facility] office. The participants in this visit include the listed provider and patient. The visit was conducted today via Webex.  HPI:   Chief Complaint: OBESITY Molly Archer is here to discuss her progress with her obesity treatment plan. She is on the Category 4 plan and is following her eating plan approximately 85% of the time. She states she is walking 20 minutes 2 times per week. Molly Archer states she weighed 323 lbs last week at an MD visit. She has gained approximately 12 lbs since her last office visit with [place]. She reports trying to stick to the plan but struggles at dinner and often just snacks rather then eating a meal. She does not cook at dinner since she lives alone. We were unable to weigh the patient today for this TeleHealth visit. She reports her weight was 323 lbs on 12/17/2018. She has lost 11 lbs since starting treatment with [place].  Vitamin D deficiency Molly Archer has a diagnosis of Vitamin D deficiency, which is not at goal. Her last Vitamin D level was reported to be 7.6 on 08/11/2018. She is currently taking prescription Vit D and denies nausea, vomiting or muscle weakness.  Hypertension Molly Archer is a 38 y.o. female with hypertension, which is not well controlled.  Molly Archer denies chest pain or shortness of breath on exertion. She is working weight loss to help control her blood pressure with the goal of decreasing her risk of heart attack and stroke. Molly Archer's blood pressure was 152/80 at a recent office visit. BP Readings from Last 3 Encounters:  09/24/18 140/84  09/24/18 140/77  09/15/18 (!) 141/89    ASSESSMENT AND PLAN:  Vitamin D deficiency - Plan: Vitamin D, Ergocalciferol, (DRISDOL) 1.25 MG (50000 UT) CAPS capsule  Essential hypertension  - Plan: amLODipine (NORVASC) 10 MG tablet  Class 3 severe obesity with serious comorbidity and body mass index (BMI) of 50.0 to 59.9 in adult, unspecified obesity type (HCC)  PLAN:  Vitamin D Deficiency Molly Archer was informed that low Vitamin D levels contributes to fatigue and are associated with obesity, breast, and colon cancer. She agrees to continue to take prescription Vit D @ 50,000 IU every week #4 with 0 refills and will follow-up for routine testing of Vitamin D, at least 2-3 times per year. She was informed of the risk of over-replacement of Vitamin D and agrees to not increase her dose unless she discusses this with [place] first. Molly Archer agrees to follow-up with our clinic in 2-3 weeks.  Hypertension We discussed sodium restriction, working on healthy weight loss, and a regular exercise program as the means to achieve improved blood pressure control. Molly Archer agreed with this plan and agreed to follow up as directed. We will continue to monitor her blood pressure as well as her progress with the above lifestyle modifications. We may need to add a medication to her current regimen of amlodipine. She was given a refill on her amlodipine 10 mg QD #30 with 0 refills. She agrees to follow-up with our clinic in 2-3 weeks as she continues her lifestyle modifications.  Obesity Molly Archer is currently in the action stage of change. As such, her goal is to continue with weight loss efforts. She has agreed to follow the Category 4 plan and journal 550-700 calories + 45  grams of protein at supper. Molly Archer has been instructed to work up to a goal of 150 minutes of combined cardio and strengthening exercise per week for weight loss and overall health benefits. We discussed the following Behavioral Modification Strategies today: increasing lean protein intake, no skipping meals, work on meal planning and easy cooking plans, and planning for success.  Molly Archer has agreed to follow-up with our clinic in 2-3  weeks. She was informed of the importance of frequent follow-up visits to maximize her success with intensive lifestyle modifications for her multiple health conditions.  ALLERGIES: Allergies  Allergen Reactions  . Codeine Itching  . Peanut-Containing Drug Products Itching  . Penicillins Hives    Has patient had a PCN reaction causing immediate rash, facial/tongue/throat swelling, SOB or lightheadedness with hypotension: No Has patient had a PCN reaction causing severe rash involving mucus membranes or skin necrosis: No Has patient had a PCN reaction that required hospitalization No Has patient had a PCN reaction occurring within the last 10 years: No If all of the above answers are "NO", then may proceed with Cephalosporin use.   . Shellfish Allergy Itching    MEDICATIONS: Current Outpatient Medications on File Prior to Visit  Medication Sig Dispense Refill  . albuterol (PROVENTIL HFA;VENTOLIN HFA) 108 (90 Base) MCG/ACT inhaler Inhale 2 puffs into the lungs every 4 (four) hours as needed for wheezing or shortness of breath (cough, shortness of breath or wheezing.). 1 Inhaler 1  . diclofenac (VOLTAREN) 75 MG EC tablet Take 1 tablet (75 mg total) by mouth 2 (two) times daily with a meal. 60 tablet 0  . ferrous gluconate (FERGON) 324 MG tablet Take 1 tablet (324 mg total) by mouth 2 (two) times daily with a meal. 60 tablet 0  . ibuprofen (ADVIL,MOTRIN) 800 MG tablet Take 1 tablet (800 mg total) by mouth every 8 (eight) hours as needed. 60 tablet 1  . norethindrone (AYGESTIN) 5 MG tablet Take 3 tablets (15 mg total) by mouth daily. 180 tablet 5  . oxyCODONE-acetaminophen (PERCOCET/ROXICET) 5-325 MG tablet Take 1 tablet by mouth every 6 (six) hours as needed for moderate pain. 10 tablet 0  . polyethylene glycol powder (MIRALAX) powder Take 1 Container by mouth once.    . triamcinolone cream (KENALOG) 0.1 % Apply 1 application topically 2 (two) times daily. 30 g 0   No current  facility-administered medications on file prior to visit.     PAST MEDICAL HISTORY: Past Medical History:  Diagnosis Date  . Anemia   . Asthma    only uses inhaler in fall and spring  . Constipation   . GERD (gastroesophageal reflux disease)    diet controlled, occ. uses omeprazole  . Headache    otc med prn  . History of kidney stones   . Hx of Bell's palsy June 2013  . Hypertension    currently no meds  . Menometrorrhagia   . Prediabetes   . Psoriasis   . Shortness of breath dyspnea    with exercise/exertion  . Vitamin D deficiency     PAST SURGICAL HISTORY: Past Surgical History:  Procedure Laterality Date  . COMBINED HYSTEROSCOPY DIAGNOSTIC / D&C N/A 2011   Benign secretory endometrium  . CYSTOSCOPY WITH STENT PLACEMENT Left 10/19/2014   Procedure: CYSTOSCOPY WITH STENT PLACEMENT;  Surgeon: Ardis Hughs, MD;  Location: [facility];  Service: Urology;  Laterality: Left;  . DILATION AND CURETTAGE OF UTERUS  10/2003   Polyp, mild atypia, simple and complex hyperplasia,  .  DILATION AND CURETTAGE OF UTERUS  11/2003   Simple and complex hyperplasia  . DILATION AND CURETTAGE OF UTERUS  2011  . DILATION AND CURETTAGE OF UTERUS N/A 12/14/2014   Procedure: DILATATION AND CURETTAGE;  Surgeon: Mora Bellman, MD;  Location: [facility];  Service: Gynecology;  Laterality: N/A;  . HOLMIUM LASER APPLICATION Left 9/32/3557   Procedure: HOLMIUM LASER APPLICATION;  Surgeon: Ardis Hughs, MD;  Location: [facility];  Service: Urology;  Laterality: Left;  . HYSTEROSCOPY W/D&C N/A 07/21/2018   Procedure: DILATATION AND CURETTAGE /HYSTEROSCOPY;  Surgeon: Woodroe Mode, MD;  Location: [facility];  Service: Gynecology;  Laterality: N/A;  . INTRAUTERINE DEVICE (IUD) INSERTION N/A 12/14/2014   Procedure: INTRAUTERINE DEVICE (IUD) INSERTION;  Surgeon: Mora Bellman, MD;  Location: [facility];  Service: Gynecology;  Laterality: N/A;  . STONE EXTRACTION WITH BASKET Left 10/19/2014   Procedure: STONE  EXTRACTION WITH BASKET;  Surgeon: Ardis Hughs, MD;  Location: [facility];  Service: Urology;  Laterality: Left;  . WISDOM TOOTH EXTRACTION      SOCIAL HISTORY: Social History   Tobacco Use  . Smoking status: Never Smoker  . Smokeless tobacco: Never Used  Substance Use Topics  . Alcohol use: No  . Drug use: No    FAMILY HISTORY: Family History  Problem Relation Age of Onset  . Hypertension Other   . Diabetes Other   . Cancer Other   . Hypertension Mother   . Hyperlipidemia Mother   . Hypertension Father    ROS: Review of Systems  Respiratory: Negative for shortness of breath.   Cardiovascular: Negative for chest pain.  Gastrointestinal: Negative for nausea and vomiting.  Musculoskeletal:       Negative for muscle weakness.   PHYSICAL EXAM: Pt in no acute distress  RECENT LABS AND TESTS: BMET    Component Value Date/Time   NA 141 08/11/2018 1320   K 4.4 08/11/2018 1320   CL 104 08/11/2018 1320   CO2 21 08/11/2018 1320   GLUCOSE 78 08/11/2018 1320   GLUCOSE 109 (H) 07/21/2018 0555   BUN 10 08/11/2018 1320   CREATININE 0.60 08/11/2018 1320   CALCIUM 9.3 08/11/2018 1320   GFRNONAA 118 08/11/2018 1320   GFRAA 136 08/11/2018 1320   Lab Results  Component Value Date   HGBA1C 6.1 (H) 08/11/2018   HGBA1C 5.7 (H) 04/17/2018   Lab Results  Component Value Date   INSULIN 38.4 (H) 08/11/2018   CBC    Component Value Date/Time   WBC 7.0 08/11/2018 1320   WBC 7.7 07/24/2018 0238   RBC 4.24 08/11/2018 1320   RBC 4.20 07/24/2018 0238   HGB 8.5 (L) 08/11/2018 1320   HCT 29.5 (L) 08/11/2018 1320   PLT 369 07/24/2018 0238   PLT 451 (H) 06/09/2018 1743   MCV 70 (L) 08/11/2018 1320   MCH 20.0 (L) 08/11/2018 1320   MCH 20.2 (L) 07/24/2018 0238   MCHC 28.8 (L) 08/11/2018 1320   MCHC 27.6 (L) 07/24/2018 0238   RDW 15.9 (H) 08/11/2018 1320   LYMPHSABS 1.8 08/11/2018 1320   MONOABS 0.6 07/24/2018 0238   EOSABS 0.4 08/11/2018 1320   BASOSABS 0.1 08/11/2018 1320    Iron/TIBC/Ferritin/ %Sat No results found for: IRON, TIBC, FERRITIN, IRONPCTSAT Lipid Panel     Component Value Date/Time   CHOL 155 08/11/2018 1320   TRIG 86 08/11/2018 1320   HDL 29 (L) 08/11/2018 1320   CHOLHDL 4.2 04/17/2018 1354   CHOLHDL 5.3 Ratio 03/25/2009 2040  VLDL 21 03/25/2009 2040   LDLCALC 109 (H) 08/11/2018 1320   Hepatic Function Panel     Component Value Date/Time   PROT 7.0 08/11/2018 1320   ALBUMIN 4.7 08/11/2018 1320   AST 12 08/11/2018 1320   ALT 23 08/11/2018 1320   ALKPHOS 47 08/11/2018 1320   BILITOT 0.3 08/11/2018 1320      Component Value Date/Time   TSH 1.200 08/11/2018 1320   TSH 0.615 07/20/2018 2307   TSH 2.300 04/17/2018 1354   TSH 1.403 10/21/2013 1634   Results for INGA, NOLLER (MRN [id number]) as of 12/24/2018 14:55  Ref. Range 08/11/2018 13:20  Vitamin D, 25-Hydroxy Latest Ref Range: 30.0 - 100.0 ng/mL 7.6 (L)    I, Michaelene Song, am acting as Location manager for [employer], FNP-C.  I have reviewed the above documentation for accuracy and completeness, and I agree with the above.  - Dawn Whitmire, FNP-C.

## 2018-12-25 ENCOUNTER — Encounter (INDEPENDENT_AMBULATORY_CARE_PROVIDER_SITE_OTHER): Payer: Self-pay | Admitting: Family Medicine

## 2019-01-12 ENCOUNTER — Other Ambulatory Visit: Payer: Self-pay

## 2019-01-12 ENCOUNTER — Encounter (INDEPENDENT_AMBULATORY_CARE_PROVIDER_SITE_OTHER): Payer: Self-pay | Admitting: Family Medicine

## 2019-01-12 ENCOUNTER — Ambulatory Visit (INDEPENDENT_AMBULATORY_CARE_PROVIDER_SITE_OTHER): Payer: BLUE CROSS/BLUE SHIELD | Admitting: Family Medicine

## 2019-01-12 VITALS — BP 147/83 | HR 83 | Temp 98.3°F | Ht 64.0 in | Wt 320.0 lb

## 2019-01-12 DIAGNOSIS — Z9189 Other specified personal risk factors, not elsewhere classified: Secondary | ICD-10-CM

## 2019-01-12 DIAGNOSIS — Z6841 Body Mass Index (BMI) 40.0 and over, adult: Secondary | ICD-10-CM

## 2019-01-12 DIAGNOSIS — R7303 Prediabetes: Secondary | ICD-10-CM | POA: Diagnosis not present

## 2019-01-12 DIAGNOSIS — E559 Vitamin D deficiency, unspecified: Secondary | ICD-10-CM

## 2019-01-12 DIAGNOSIS — D508 Other iron deficiency anemias: Secondary | ICD-10-CM

## 2019-01-12 DIAGNOSIS — I10 Essential (primary) hypertension: Secondary | ICD-10-CM | POA: Diagnosis not present

## 2019-01-12 MED ORDER — VITAMIN D (ERGOCALCIFEROL) 1.25 MG (50000 UNIT) PO CAPS: 50000.0000 [IU] | ORAL_CAPSULE | ORAL | 0 refills | Status: DC

## 2019-01-12 MED ORDER — AMLODIPINE BESYLATE 10 MG PO TABS: 10.0000 mg | ORAL_TABLET | Freq: Every day | ORAL | 0 refills | Status: DC

## 2019-01-12 NOTE — Progress Notes (Signed)
Office: [phone]  /  Fax: [phone]   HPI:   Chief Complaint: OBESITY Molly Archer is here to discuss her progress with her obesity treatment plan. She is on the Category 4 plan and is following her eating plan approximately 80% of the time. She states she is walking 20 minutes 3 times per week. Molly Archer states she recently quit her job and has been looking for a job. She reports increased stress due to lack of job and is eating more due to stress. Her weight is (!) 320 lb (145.2 kg) today and has had a weight gain of 9 lbs since her last in office visit. She has lost 2 lbs since starting treatment with [place].  Hypertension Molly Archer is a 38 y.o. female with hypertension, which is not well controlled.  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 She is working weight loss to help control her blood pressure with the goal of decreasing her risk of heart attack and stroke. Molly Archer's blood pressure today is 147/83. BP Readings from Last 3 Encounters:  01/12/19 (!) 147/83  09/24/18 140/84  09/24/18 140/77   Vitamin D deficiency Molly Archer has a diagnosis of Vitamin D deficiency, which is not at goal. Her last Vitamin D level was reported to be 7.6 on 08/11/2018. She is currently taking prescription Vit D and denies nausea, vomiting or muscle weakness.  At risk for osteopenia and osteoporosis Molly Archer is at higher risk of osteopenia and osteoporosis due to Vitamin D deficiency.   Iron Deficiency Anemia Molly Archer reports having heavy periods. She is on prescription iron BID and OCP. She denies palpitations but does report fatigue.  Pre-Diabetes Molly Archer has a diagnosis of prediabetes based on her elevated Hgb A1c and was informed this puts her at greater risk of developing diabetes. She is not taking metformin currently and continues to work on diet and exercise to decrease risk of diabetes. She denies polyphagia. Lab Results  Component Value Date   HGBA1C 6.1  (H) 08/11/2018    ASSESSMENT AND PLAN:  Essential hypertension - Plan: Lipid Panel With LDL/HDL Ratio, amLODipine (NORVASC) 10 MG tablet  Vitamin D deficiency - Plan: VITAMIN D 25 Hydroxy (Vit-D Deficiency, Fractures), Vitamin D, Ergocalciferol, (DRISDOL) 1.25 MG (50000 UT) CAPS capsule  Prediabetes - Plan: Comprehensive metabolic panel, Hemoglobin A1c, Insulin, random  Other iron deficiency anemia - Plan: CBC With Differential, Anemia panel  At risk for osteoporosis  Class 3 severe obesity with serious comorbidity and body mass index (BMI) of 50.0 to 59.9 in adult, unspecified obesity type (HCC)  PLAN:  Hypertension We discussed sodium restriction, working on healthy weight loss, and a regular exercise program as the means to achieve improved blood pressure control. Molly Archer agreed with this plan and agreed to follow up as directed. We will continue to monitor her blood pressure as well as her progress with the above lifestyle modifications. Molly Archer was given a refill on her Norvasc 10 mg daily #30 with 0 refills and she agrees to follow-up with our clinic in 2 weeks. We may need to add another medication to Norvasc. Will continue to monitor.  She will watch for signs of hypotension as she continues her lifestyle modifications.  Vitamin D Deficiency Molly Archer was informed that low Vitamin D levels contributes to fatigue and are associated with obesity, breast, and colon cancer. She agrees to continue to take prescription Vit D @ 50,000 IU every week #4 with 0 refills and will follow-up for routine testing  of Vitamin D, at least 2-3 times per year. She was informed of the risk of over-replacement of Vitamin D and agrees to not increase her dose unless she discusses this with [place] first. Molly Archer agrees to follow-up with our clinic in 2 weeks.  At risk for osteopenia and osteoporosis Molly Archer was given extended  (15 minutes) osteoporosis prevention counseling today. Molly Archer is at risk for  osteopenia and osteoporsis due to her Vitamin D deficiency. She was encouraged to take her Vitamin D and follow her higher calcium diet and increase strengthening exercise to help strengthen her bones and decrease her risk of osteopenia and osteoporosis.  Iron Deficiency Anemia Molly Archer will have CBC and anemia profile checked.  Pre-Diabetes Molly Archer will continue to work on weight loss, exercise, and decreasing simple carbohydrates in her diet to help decrease the risk of diabetes.  Molly Archer will have A1c, insulin/glucose levels checked and will follow-up with [place] as directed to monitor her progress.  Obesity Molly Archer is currently in the action stage of change. As such, her goal is to continue with weight loss efforts. She has agreed to follow the Category 4 plan and journal 550-700 calories + 45 grams of protein at supper. Molly Archer has been instructed to work up to a goal of 150 minutes of combined cardio and strengthening exercise per week for weight loss and overall health benefits. We discussed the following Behavioral Modification Strategies today: increasing lean protein intake, no skipping meals, work on meal planning and easy cooking plans, better snacking choices, emotional eating strategies, and planning for success.  Molly Archer has agreed to follow-up with our clinic in 2 weeks. She was informed of the importance of frequent follow-up visits to maximize her success with intensive lifestyle modifications for her multiple health conditions.  ALLERGIES: Allergies  Allergen Reactions  . Codeine Itching  . Peanut-Containing Drug Products Itching  . Penicillins Hives    Has patient had a PCN reaction causing immediate rash, facial/tongue/throat swelling, SOB or lightheadedness with hypotension: No Has patient had a PCN reaction causing severe rash involving mucus membranes or skin necrosis: No Has patient had a PCN reaction that required hospitalization No Has patient had a PCN reaction  occurring within the last 10 years: No If all of the above answers are "NO", then may proceed with Cephalosporin use.   . Shellfish Allergy Itching    MEDICATIONS: Current Outpatient Medications on File Prior to Visit  Medication Sig Dispense Refill  . albuterol (PROVENTIL HFA;VENTOLIN HFA) 108 (90 Base) MCG/ACT inhaler Inhale 2 puffs into the lungs every 4 (four) hours as needed for wheezing or shortness of breath (cough, shortness of breath or wheezing.). 1 Inhaler 1  . diclofenac (VOLTAREN) 75 MG EC tablet Take 1 tablet (75 mg total) by mouth 2 (two) times daily with a meal. 60 tablet 0  . ferrous gluconate (FERGON) 324 MG tablet Take 1 tablet (324 mg total) by mouth 2 (two) times daily with a meal. 60 tablet 0  . ibuprofen (ADVIL,MOTRIN) 800 MG tablet Take 1 tablet (800 mg total) by mouth every 8 (eight) hours as needed. 60 tablet 1  . norethindrone (AYGESTIN) 5 MG tablet Take 3 tablets (15 mg total) by mouth daily. 180 tablet 5  . oxyCODONE-acetaminophen (PERCOCET/ROXICET) 5-325 MG tablet Take 1 tablet by mouth every 6 (six) hours as needed for moderate pain. 10 tablet 0  . polyethylene glycol powder (MIRALAX) powder Take 1 Container by mouth once.    . triamcinolone cream (KENALOG) 0.1 % Apply 1  application topically 2 (two) times daily. 30 g 0   No current facility-administered medications on file prior to visit.     PAST MEDICAL HISTORY: Past Medical History:  Diagnosis Date  . Anemia   . Asthma    only uses inhaler in fall and spring  . Constipation   . GERD (gastroesophageal reflux disease)    diet controlled, occ. uses omeprazole  . Headache    otc med prn  . History of kidney stones   . Hx of Bell's palsy June 2013  . Hypertension    currently no meds  . Menometrorrhagia   . Prediabetes   . Psoriasis   . Shortness of breath dyspnea    with exercise/exertion  . Vitamin D deficiency     PAST SURGICAL HISTORY: Past Surgical History:  Procedure Laterality Date   . COMBINED HYSTEROSCOPY DIAGNOSTIC / D&C N/A 2011   Benign secretory endometrium  . CYSTOSCOPY WITH STENT PLACEMENT Left 10/19/2014   Procedure: CYSTOSCOPY WITH STENT PLACEMENT;  Surgeon: Ardis Hughs, MD;  Location: [facility];  Service: Urology;  Laterality: Left;  . DILATION AND CURETTAGE OF UTERUS  10/2003   Polyp, mild atypia, simple and complex hyperplasia,  . DILATION AND CURETTAGE OF UTERUS  11/2003   Simple and complex hyperplasia  . DILATION AND CURETTAGE OF UTERUS  2011  . DILATION AND CURETTAGE OF UTERUS N/A 12/14/2014   Procedure: DILATATION AND CURETTAGE;  Surgeon: Mora Bellman, MD;  Location: [facility];  Service: Gynecology;  Laterality: N/A;  . HOLMIUM LASER APPLICATION Left 03/07/9832   Procedure: HOLMIUM LASER APPLICATION;  Surgeon: Ardis Hughs, MD;  Location: [facility];  Service: Urology;  Laterality: Left;  . HYSTEROSCOPY W/D&C N/A 07/21/2018   Procedure: DILATATION AND CURETTAGE /HYSTEROSCOPY;  Surgeon: Woodroe Mode, MD;  Location: [facility];  Service: Gynecology;  Laterality: N/A;  . INTRAUTERINE DEVICE (IUD) INSERTION N/A 12/14/2014   Procedure: INTRAUTERINE DEVICE (IUD) INSERTION;  Surgeon: Mora Bellman, MD;  Location: [facility];  Service: Gynecology;  Laterality: N/A;  . STONE EXTRACTION WITH BASKET Left 10/19/2014   Procedure: STONE EXTRACTION WITH BASKET;  Surgeon: Ardis Hughs, MD;  Location: [facility];  Service: Urology;  Laterality: Left;  . WISDOM TOOTH EXTRACTION      SOCIAL HISTORY: Social History   Tobacco Use  . Smoking status: Never Smoker  . Smokeless tobacco: Never Used  Substance Use Topics  . Alcohol use: No  . Drug use: No    FAMILY HISTORY: Family History  Problem Relation Age of Onset  . Hypertension Other   . Diabetes Other   . Cancer Other   . Hypertension Mother   . Hyperlipidemia Mother   . Hypertension Father    ROS: Review of Systems  Constitutional: Positive for malaise/fatigue.  Respiratory: Negative for shortness of  breath.   Cardiovascular: Negative for chest pain and palpitations.  Gastrointestinal: Negative for nausea and vomiting.  Musculoskeletal:       Negative for muscle weakness.  Endo/Heme/Allergies:       Negative for polyphagia.   PHYSICAL EXAM: Blood pressure (!) 147/83, pulse 83, temperature 98.3 F (36.8 C), temperature source Oral, height 5\' 4"  (1.626 m), weight (!) 320 lb (145.2 kg), SpO2 98 %. Body mass index is 54.93 kg/m. Physical Exam Vitals signs reviewed.  Constitutional:      Appearance: Normal appearance. She is obese.  Cardiovascular:     Rate and Rhythm: Normal rate.     Pulses: Normal pulses.  Pulmonary:     Effort: Pulmonary effort is normal.     Breath sounds: Normal breath sounds.  Musculoskeletal: Normal range of motion.  Skin:    General: Skin is warm and dry.  Neurological:     Mental Status: She is alert and oriented to person, place, and time.  Psychiatric:        Behavior: Behavior normal.   RECENT LABS AND TESTS: BMET    Component Value Date/Time   NA 141 08/11/2018 1320   K 4.4 08/11/2018 1320   CL 104 08/11/2018 1320   CO2 21 08/11/2018 1320   GLUCOSE 78 08/11/2018 1320   GLUCOSE 109 (H) 07/21/2018 0555   BUN 10 08/11/2018 1320   CREATININE 0.60 08/11/2018 1320   CALCIUM 9.3 08/11/2018 1320   GFRNONAA 118 08/11/2018 1320   GFRAA 136 08/11/2018 1320   Lab Results  Component Value Date   HGBA1C 6.1 (H) 08/11/2018   HGBA1C 5.7 (H) 04/17/2018   Lab Results  Component Value Date   INSULIN 38.4 (H) 08/11/2018   CBC    Component Value Date/Time   WBC 7.0 08/11/2018 1320   WBC 7.7 07/24/2018 0238   RBC 4.24 08/11/2018 1320   RBC 4.20 07/24/2018 0238   HGB 8.5 (L) 08/11/2018 1320   HCT 29.5 (L) 08/11/2018 1320   PLT 369 07/24/2018 0238   PLT 451 (H) 06/09/2018 1743   MCV 70 (L) 08/11/2018 1320   MCH 20.0 (L) 08/11/2018 1320   MCH 20.2 (L) 07/24/2018 0238   MCHC 28.8 (L) 08/11/2018 1320   MCHC 27.6 (L) 07/24/2018 0238   RDW 15.9  (H) 08/11/2018 1320   LYMPHSABS 1.8 08/11/2018 1320   MONOABS 0.6 07/24/2018 0238   EOSABS 0.4 08/11/2018 1320   BASOSABS 0.1 08/11/2018 1320   Iron/TIBC/Ferritin/ %Sat No results found for: IRON, TIBC, FERRITIN, IRONPCTSAT Lipid Panel     Component Value Date/Time   CHOL 155 08/11/2018 1320   TRIG 86 08/11/2018 1320   HDL 29 (L) 08/11/2018 1320   CHOLHDL 4.2 04/17/2018 1354   CHOLHDL 5.3 Ratio 03/25/2009 2040   VLDL 21 03/25/2009 2040   LDLCALC 109 (H) 08/11/2018 1320   Hepatic Function Panel     Component Value Date/Time   PROT 7.0 08/11/2018 1320   ALBUMIN 4.7 08/11/2018 1320   AST 12 08/11/2018 1320   ALT 23 08/11/2018 1320   ALKPHOS 47 08/11/2018 1320   BILITOT 0.3 08/11/2018 1320      Component Value Date/Time   TSH 1.200 08/11/2018 1320   TSH 0.615 07/20/2018 2307   TSH 2.300 04/17/2018 1354   TSH 1.403 10/21/2013 1634    Results for DARRIA, CORVERA (MRN [id number]) as of 01/12/2019 15:11  Ref. Range 08/11/2018 13:20  Vitamin D, 25-Hydroxy Latest Ref Range: 30.0 - 100.0 ng/mL 7.6 (L)    OBESITY BEHAVIORAL INTERVENTION VISIT  Today's visit was #10   Starting weight: 322 lbs Starting date: 08/11/2018 Today's weight: 320 lbs Today's date: 01/12/2019 Total lbs lost to date: 2  ASK: We discussed the diagnosis of obesity with Molly Archer today and Areej agreed to give [place] permission to discuss obesity behavioral modification therapy today.  ASSESS: Molly Archer has the diagnosis of obesity and her BMI today is 55.1. Molly Archer is in the action stage of change.   ADVISE: Molly Archer was educated on the multiple health risks of obesity as well as the benefit of weight loss to improve her health. She was advised of the need for  long term treatment and the importance of lifestyle modifications to improve her current health and to decrease her risk of future health problems.  AGREE: Multiple dietary modification options and treatment options were discussed and   Molly Archer agreed to follow the recommendations documented in the above note.  ARRANGE: Molly Archer was educated on the importance of frequent visits to treat obesity as outlined per CMS and USPSTF guidelines and agreed to schedule her next follow up appointment today.  IMichaelene Song, am acting as Location manager for [employer], FNP  I have reviewed the above documentation for accuracy and completeness, and I agree with the above.  - Kiearra Oyervides, FNP-C.

## 2019-01-13 ENCOUNTER — Encounter (INDEPENDENT_AMBULATORY_CARE_PROVIDER_SITE_OTHER): Payer: Self-pay | Admitting: Family Medicine

## 2019-01-13 LAB — COMPREHENSIVE METABOLIC PANEL
ALT: 23 IU/L (ref 0–32)
AST: 17 IU/L (ref 0–40)
Albumin/Globulin Ratio: 1.7 (ref 1.2–2.2)
Albumin: 4.5 g/dL (ref 3.8–4.8)
Alkaline Phosphatase: 41 IU/L (ref 39–117)
BUN/Creatinine Ratio: 16 (ref 9–23)
BUN: 11 mg/dL (ref 6–20)
Bilirubin Total: 0.3 mg/dL (ref 0.0–1.2)
CO2: 22 mmol/L (ref 20–29)
Calcium: 9.6 mg/dL (ref 8.7–10.2)
Chloride: 103 mmol/L (ref 96–106)
Creatinine, Ser: 0.68 mg/dL (ref 0.57–1.00)
GFR calc Af Amer: 130 mL/min/{1.73_m2} (ref >59)
GFR calc non Af Amer: 113 mL/min/{1.73_m2} (ref >59)
Globulin, Total: 2.6 g/dL (ref 1.5–4.5)
Glucose: 114 mg/dL — ABNORMAL HIGH (ref 65–99)
Potassium: 4.2 mmol/L (ref 3.5–5.2)
Sodium: 138 mmol/L (ref 134–144)
Total Protein: 7.1 g/dL (ref 6.0–8.5)

## 2019-01-13 LAB — CBC WITH DIFFERENTIAL
Basophils Absolute: 0.1 10*3/uL (ref 0.0–0.2)
Basos: 1 %
EOS (ABSOLUTE): 0.5 10*3/uL — ABNORMAL HIGH (ref 0.0–0.4)
Eos: 7 %
Hemoglobin: 13.9 g/dL (ref 11.1–15.9)
Immature Grans (Abs): 0 10*3/uL (ref 0.0–0.1)
Immature Granulocytes: 0 %
Lymphocytes Absolute: 1.8 10*3/uL (ref 0.7–3.1)
Lymphs: 26 %
MCH: 25.9 pg — ABNORMAL LOW (ref 26.6–33.0)
MCHC: 31.7 g/dL (ref 31.5–35.7)
MCV: 82 fL (ref 79–97)
Monocytes Absolute: 0.5 10*3/uL (ref 0.1–0.9)
Monocytes: 8 %
Neutrophils Absolute: 3.9 10*3/uL (ref 1.4–7.0)
Neutrophils: 58 %
RBC: 5.36 x10E6/uL — ABNORMAL HIGH (ref 3.77–5.28)
RDW: 14.5 % (ref 11.7–15.4)
WBC: 6.7 10*3/uL (ref 3.4–10.8)

## 2019-01-13 LAB — INSULIN, RANDOM: INSULIN: 532 u[IU]/mL — ABNORMAL HIGH (ref 2.6–24.9)

## 2019-01-13 LAB — LIPID PANEL WITH LDL/HDL RATIO
Cholesterol, Total: 197 mg/dL (ref 100–199)
HDL: 29 mg/dL — ABNORMAL LOW (ref >39)
LDL Calculated: 146 mg/dL — ABNORMAL HIGH (ref 0–99)
LDl/HDL Ratio: 5 ratio — ABNORMAL HIGH (ref 0.0–3.2)
Triglycerides: 111 mg/dL (ref 0–149)
VLDL Cholesterol Cal: 22 mg/dL (ref 5–40)

## 2019-01-13 LAB — VITAMIN D 25 HYDROXY (VIT D DEFICIENCY, FRACTURES): Vit D, 25-Hydroxy: 34.3 ng/mL (ref 30.0–100.0)

## 2019-01-13 LAB — ANEMIA PANEL
Ferritin: 51 ng/mL (ref 15–150)
Folate, Hemolysate: 340 ng/mL
Folate, RBC: 774 ng/mL (ref >498)
Hematocrit: 43.9 % (ref 34.0–46.6)
Iron Saturation: 14 % — ABNORMAL LOW (ref 15–55)
Iron: 65 ug/dL (ref 27–159)
Retic Ct Pct: 1.1 % (ref 0.6–2.6)
Total Iron Binding Capacity: 457 ug/dL — ABNORMAL HIGH (ref 250–450)
UIBC: 392 ug/dL (ref 131–425)
Vitamin B-12: 288 pg/mL (ref 232–1245)

## 2019-01-13 LAB — HEMOGLOBIN A1C
Est. average glucose Bld gHb Est-mCnc: 131 mg/dL
Hgb A1c MFr Bld: 6.2 % — ABNORMAL HIGH (ref 4.8–5.6)

## 2019-01-14 NOTE — Telephone Encounter (Signed)
Please review

## 2019-01-22 ENCOUNTER — Encounter (INDEPENDENT_AMBULATORY_CARE_PROVIDER_SITE_OTHER): Payer: Self-pay | Admitting: Family Medicine

## 2019-01-22 ENCOUNTER — Other Ambulatory Visit: Payer: Self-pay

## 2019-01-22 ENCOUNTER — Ambulatory Visit (INDEPENDENT_AMBULATORY_CARE_PROVIDER_SITE_OTHER): Payer: BLUE CROSS/BLUE SHIELD | Admitting: Family Medicine

## 2019-01-22 VITALS — BP 136/84 | HR 83 | Temp 98.3°F | Ht 64.0 in | Wt 320.0 lb

## 2019-01-22 DIAGNOSIS — E559 Vitamin D deficiency, unspecified: Secondary | ICD-10-CM

## 2019-01-22 DIAGNOSIS — R7303 Prediabetes: Secondary | ICD-10-CM | POA: Diagnosis not present

## 2019-01-22 DIAGNOSIS — Z9189 Other specified personal risk factors, not elsewhere classified: Secondary | ICD-10-CM | POA: Diagnosis not present

## 2019-01-22 DIAGNOSIS — I10 Essential (primary) hypertension: Secondary | ICD-10-CM

## 2019-01-22 DIAGNOSIS — D508 Other iron deficiency anemias: Secondary | ICD-10-CM

## 2019-01-22 DIAGNOSIS — Z6841 Body Mass Index (BMI) 40.0 and over, adult: Secondary | ICD-10-CM

## 2019-01-22 MED ORDER — VITAMIN D (ERGOCALCIFEROL) 1.25 MG (50000 UNIT) PO CAPS: 50000.0000 [IU] | ORAL_CAPSULE | ORAL | 1 refills | Status: DC

## 2019-01-22 MED ORDER — FERROUS GLUCONATE 324 (38 FE) MG PO TABS: 324.0000 mg | ORAL_TABLET | Freq: Two times a day (BID) | ORAL | 2 refills | Status: DC

## 2019-01-22 MED ORDER — AMLODIPINE BESYLATE 10 MG PO TABS: 10.0000 mg | ORAL_TABLET | Freq: Every day | ORAL | 2 refills | Status: DC

## 2019-01-22 NOTE — Progress Notes (Signed)
Office: [phone]  /  Fax: [phone]   HPI:   Chief Complaint: OBESITY Molly Archer is here to discuss her progress with her obesity treatment plan. She is on the Category 4 plan and is following her eating plan approximately 90 % of the time. She states she is exercising 0 minutes 0 times per week. Molly Archer is currently sticking to Category 4 fairly well. Her mother recently moved in with her and she eats a lot of sweets and keeps these in the house which tempts Molly Archer. She may need to take a hiatus from the program due to finances. She recently lost her job.  Her weight is (!) 320 lb (145.2 kg) today and has not lost weight since her last visit. She has lost 2 lbs since starting treatment with [place].  Hypertension Molly Archer is a 38 y.o. female with hypertension. Molly Archer's blood pressure is currently well controlled today at 136/84. She is working on weight loss to help control her blood pressure with the goal of decreasing her risk of heart attack and stroke. Molly Archer denies chest pain or shortness of breath. BP Readings from Last 3 Encounters:  01/22/19 136/84  01/12/19 (!) 147/83  09/24/18 140/84     Iron Deficiency Anemia Molly Archer has a diagnosis of anemia and it is well controlled in iron BID. Her hemoglobin was 13.9 on 01/12/19. She notes fatigue, but it is improved with iron supplementation. Molly Archer has had heavy periods in the past which has caused anemia. She has had a D&C in December 2019 and she follows up with her gynecologist for this. She is on oral contraceptive pills and is not having periods currently.  Vitamin D Deficiency Molly Archer has a diagnosis of vitamin D deficiency. She is currently on vit D, but is still not at goal. Her level is improved from 7.6 to 34.3 on 01/12/19. Molly Archer denies nausea, vomiting, or muscle weakness.  Pre-Diabetes Molly Archer has a diagnosis of pre-diabetes based on her elevated Hgb A1c and was informed this puts her at greater risk of  developing diabetes. Her A1c  Has increased to 6.2. She is not taking metformin currently and she has been on this in the past and does not want to start metformin. She continues to work on diet and exercise to decrease risk of diabetes. She denies nausea, vomiting, or hypoglycemia.   ASSESSMENT AND PLAN:  Essential hypertension - Plan: amLODipine (NORVASC) 10 MG tablet  Other iron deficiency anemia - Plan: ferrous gluconate (FERGON) 324 MG tablet  Vitamin D deficiency - Plan: Vitamin D, Ergocalciferol, (DRISDOL) 1.25 MG (50000 UT) CAPS capsule  Prediabetes  At risk for diabetes mellitus  Class 3 severe obesity with serious comorbidity and body mass index (BMI) of 50.0 to 59.9 in adult, unspecified obesity type ([place])  PLAN:  Hypertension We discussed sodium restriction, working on healthy weight loss, and a regular exercise program as the means to achieve improved blood pressure control. We will continue to monitor her blood pressure as well as her progress with the above lifestyle modifications. She will continue her Norvasc 10 mg qd #30 with no refills and will watch for signs of hypotension as she continues her lifestyle modifications. Molly Archer agreed with this plan and agreed to follow up as directed in 2 to 3 weeks.  Iron Deficiency Anemia The diagnosis of Iron deficiency anemia was discussed with Molly Archer and was explained in detail. She was given suggestions of iron rich foods and Fergon 324 mg BID # 60 with no refills  was prescribed.   Vitamin D Deficiency Molly Archer was informed that low vitamin D levels contribute to fatigue and are associated with obesity, breast, and colon cancer. Molly Archer agrees to continue to take prescription Vit D @50 ,000 IU every week #4 with no refills and will follow up for routine testing of vitamin D, at least 2-3 times per year. She was informed of the risk of over-replacement of vitamin D and agrees to not increase her dose unless she discusses this with [place]  first. Molly Archer agrees to follow up in 2 to 3 weeks as directed.  Pre-Diabetes Molly Archer will continue to work on weight loss, exercise, and decreasing simple carbohydrates in her diet to help decrease the risk of diabetes. She was informed that eating too many simple carbohydrates or too many calories at one sitting increases the likelihood of GI side effects. We discussed that she must work on weight loss and reducing CHO intake to stop progression to diabetes. Molly Archer agreed to follow up with [place] as directed to monitor her progress in 2 to 3 weeks.   Obesity Molly Archer is currently in the action stage of change. As such, her goal is to continue with weight loss efforts. She has agreed to follow the Category 4 plan. Molly Archer has been instructed to walk every other day as tolerated. We discussed the following Behavioral Modification Strategies today: dealing with family or coworker sabotage.  Molly Archer has agreed to follow up with our clinic in 2 to 3 weeks. She was informed of the importance of frequent follow up visits to maximize her success with intensive lifestyle modifications for her multiple health conditions.  ALLERGIES: Allergies  Allergen Reactions  . Codeine Itching  . Peanut-Containing Drug Products Itching  . Penicillins Hives    Has patient had a PCN reaction causing immediate rash, facial/tongue/throat swelling, SOB or lightheadedness with hypotension: No Has patient had a PCN reaction causing severe rash involving mucus membranes or skin necrosis: No Has patient had a PCN reaction that required hospitalization No Has patient had a PCN reaction occurring within the last 10 years: No If all of the above answers are "NO", then may proceed with Cephalosporin use.   . Shellfish Allergy Itching    MEDICATIONS: Current Outpatient Medications on File Prior to Visit  Medication Sig Dispense Refill  . albuterol (PROVENTIL HFA;VENTOLIN HFA) 108 (90 Base) MCG/ACT inhaler Inhale 2 puffs  into the lungs every 4 (four) hours as needed for wheezing or shortness of breath (cough, shortness of breath or wheezing.). 1 Inhaler 1  . diclofenac (VOLTAREN) 75 MG EC tablet Take 1 tablet (75 mg total) by mouth 2 (two) times daily with a meal. 60 tablet 0  . ibuprofen (ADVIL,MOTRIN) 800 MG tablet Take 1 tablet (800 mg total) by mouth every 8 (eight) hours as needed. 60 tablet 1  . norethindrone (AYGESTIN) 5 MG tablet Take 3 tablets (15 mg total) by mouth daily. 180 tablet 5  . oxyCODONE-acetaminophen (PERCOCET/ROXICET) 5-325 MG tablet Take 1 tablet by mouth every 6 (six) hours as needed for moderate pain. 10 tablet 0  . polyethylene glycol powder (MIRALAX) powder Take 1 Container by mouth once.    . triamcinolone cream (KENALOG) 0.1 % Apply 1 application topically 2 (two) times daily. 30 g 0   No current facility-administered medications on file prior to visit.     PAST MEDICAL HISTORY: Past Medical History:  Diagnosis Date  . Anemia   . Asthma    only uses inhaler in fall and spring  .  Constipation   . GERD (gastroesophageal reflux disease)    diet controlled, occ. uses omeprazole  . Headache    otc med prn  . History of kidney stones   . Hx of Bell's palsy June 2013  . Hypertension    currently no meds  . Menometrorrhagia   . Prediabetes   . Psoriasis   . Shortness of breath dyspnea    with exercise/exertion  . Vitamin D deficiency     PAST SURGICAL HISTORY: Past Surgical History:  Procedure Laterality Date  . COMBINED HYSTEROSCOPY DIAGNOSTIC / D&C N/A 2011   Benign secretory endometrium  . CYSTOSCOPY WITH STENT PLACEMENT Left 10/19/2014   Procedure: CYSTOSCOPY WITH STENT PLACEMENT;  Surgeon: Ardis Hughs, MD;  Location: [facility];  Service: Urology;  Laterality: Left;  . DILATION AND CURETTAGE OF UTERUS  10/2003   Polyp, mild atypia, simple and complex hyperplasia,  . DILATION AND CURETTAGE OF UTERUS  11/2003   Simple and complex hyperplasia  . DILATION AND  CURETTAGE OF UTERUS  2011  . DILATION AND CURETTAGE OF UTERUS N/A 12/14/2014   Procedure: DILATATION AND CURETTAGE;  Surgeon: Mora Bellman, MD;  Location: [facility];  Service: Gynecology;  Laterality: N/A;  . HOLMIUM LASER APPLICATION Left 6/43/3295   Procedure: HOLMIUM LASER APPLICATION;  Surgeon: Ardis Hughs, MD;  Location: [facility];  Service: Urology;  Laterality: Left;  . HYSTEROSCOPY W/D&C N/A 07/21/2018   Procedure: DILATATION AND CURETTAGE /HYSTEROSCOPY;  Surgeon: Woodroe Mode, MD;  Location: [facility];  Service: Gynecology;  Laterality: N/A;  . INTRAUTERINE DEVICE (IUD) INSERTION N/A 12/14/2014   Procedure: INTRAUTERINE DEVICE (IUD) INSERTION;  Surgeon: Mora Bellman, MD;  Location: [facility];  Service: Gynecology;  Laterality: N/A;  . STONE EXTRACTION WITH BASKET Left 10/19/2014   Procedure: STONE EXTRACTION WITH BASKET;  Surgeon: Ardis Hughs, MD;  Location: [facility];  Service: Urology;  Laterality: Left;  . WISDOM TOOTH EXTRACTION      SOCIAL HISTORY: Social History   Tobacco Use  . Smoking status: Never Smoker  . Smokeless tobacco: Never Used  Substance Use Topics  . Alcohol use: No  . Drug use: No    FAMILY HISTORY: Family History  Problem Relation Age of Onset  . Hypertension Other   . Diabetes Other   . Cancer Other   . Hypertension Mother   . Hyperlipidemia Mother   . Hypertension Father     ROS: Review of Systems  Constitutional: Positive for malaise/fatigue. Negative for weight loss.  Respiratory: Negative for shortness of breath.   Cardiovascular: Negative for chest pain.  Gastrointestinal: Negative for nausea and vomiting.  Musculoskeletal:       Negative for muscle weakness.    PHYSICAL EXAM: Blood pressure 136/84, pulse 83, temperature 98.3 F (36.8 C), temperature source Oral, height 5\' 4"  (1.626 m), weight (!) 320 lb (145.2 kg), SpO2 96 %. Body mass index is 54.93 kg/m. Physical Exam Vitals signs reviewed.  Constitutional:       Appearance: Normal appearance. She is obese.  Cardiovascular:     Rate and Rhythm: Normal rate.  Pulmonary:     Effort: Pulmonary effort is normal.  Musculoskeletal: Normal range of motion.  Skin:    General: Skin is warm and dry.  Neurological:     Mental Status: She is alert and oriented to person, place, and time.  Psychiatric:        Mood and Affect: Mood normal.        Behavior:  Behavior normal.     RECENT LABS AND TESTS: BMET    Component Value Date/Time   NA 138 01/12/2019 1423   K 4.2 01/12/2019 1423   CL 103 01/12/2019 1423   CO2 22 01/12/2019 1423   GLUCOSE 114 (H) 01/12/2019 1423   GLUCOSE 109 (H) 07/21/2018 0555   BUN 11 01/12/2019 1423   CREATININE 0.68 01/12/2019 1423   CALCIUM 9.6 01/12/2019 1423   GFRNONAA 113 01/12/2019 1423   GFRAA 130 01/12/2019 1423   Lab Results  Component Value Date   HGBA1C 6.2 (H) 01/12/2019   HGBA1C 6.1 (H) 08/11/2018   HGBA1C 5.7 (H) 04/17/2018   Lab Results  Component Value Date   INSULIN 532.0 (H) 01/12/2019   INSULIN 38.4 (H) 08/11/2018   CBC    Component Value Date/Time   WBC 6.7 01/12/2019 1423   WBC 7.7 07/24/2018 0238   RBC 5.36 (H) 01/12/2019 1423   RBC 4.20 07/24/2018 0238   HGB 13.9 01/12/2019 1423   HCT 43.9 01/12/2019 1423   PLT 369 07/24/2018 0238   PLT 451 (H) 06/09/2018 1743   MCV 82 01/12/2019 1423   MCH 25.9 (L) 01/12/2019 1423   MCH 20.2 (L) 07/24/2018 0238   MCHC 31.7 01/12/2019 1423   MCHC 27.6 (L) 07/24/2018 0238   RDW 14.5 01/12/2019 1423   LYMPHSABS 1.8 01/12/2019 1423   MONOABS 0.6 07/24/2018 0238   EOSABS 0.5 (H) 01/12/2019 1423   BASOSABS 0.1 01/12/2019 1423   Iron/TIBC/Ferritin/ %Sat    Component Value Date/Time   IRON 65 01/12/2019 1423   TIBC 457 (H) 01/12/2019 1423   FERRITIN 51 01/12/2019 1423   IRONPCTSAT 14 (L) 01/12/2019 1423   Lipid Panel     Component Value Date/Time   CHOL 197 01/12/2019 1423   TRIG 111 01/12/2019 1423   HDL 29 (L) 01/12/2019 1423   CHOLHDL  4.2 04/17/2018 1354   CHOLHDL 5.3 Ratio 03/25/2009 2040   VLDL 21 03/25/2009 2040   LDLCALC 146 (H) 01/12/2019 1423   Hepatic Function Panel     Component Value Date/Time   PROT 7.1 01/12/2019 1423   ALBUMIN 4.5 01/12/2019 1423   AST 17 01/12/2019 1423   ALT 23 01/12/2019 1423   ALKPHOS 41 01/12/2019 1423   BILITOT 0.3 01/12/2019 1423      Component Value Date/Time   TSH 1.200 08/11/2018 1320   TSH 0.615 07/20/2018 2307   TSH 2.300 04/17/2018 1354   TSH 1.403 10/21/2013 1634   Results for VANISHA, WHITEN (MRN [id number]) as of 01/22/2019 15:08  Ref. Range 01/12/2019 14:23  Vitamin D, 25-Hydroxy Latest Ref Range: 30.0 - 100.0 ng/mL 34.3    OBESITY BEHAVIORAL INTERVENTION VISIT  Today's visit was # 11   Starting weight: 322 lbs Starting date: 08/11/18 Today's weight : Weight: (!) 320 lb (145.2 kg)  Today's date: 01/22/2019 Total lbs lost to date: 2    01/22/2019  Height 5\' 4"  (1.626 m)  Weight 320 lb (145.2 kg) (A)  BMI (Calculated) 54.9  BLOOD PRESSURE - SYSTOLIC 324  BLOOD PRESSURE - DIASTOLIC 84   Body Fat % 40.1 %    ASK: We discussed the diagnosis of obesity with Molly Archer today and Molly Archer agreed to give [place] permission to discuss obesity behavioral modification therapy today.  ASSESS: Molly Archer has the diagnosis of obesity and her BMI today is 54.9. Molly Archer is in the action stage of change.  ADVISE: Molly Archer was educated on the multiple health risks of  obesity as well as the benefit of weight loss to improve her health. She was advised of the need for long term treatment and the importance of lifestyle modifications to improve her current health and to decrease her risk of future health problems.  AGREE: Multiple dietary modification options and treatment options were discussed and Naryiah agreed to follow the recommendations documented in the above note.  ARRANGE: Jiana was educated on the importance of frequent visits to treat obesity as outlined  per CMS and USPSTF guidelines and agreed to schedule her next follow up appointment today.  IMarcille Archer, CMA, am acting as Location manager for [employer]. Leaman Abe, FNP-C.  I have reviewed the above documentation for accuracy and completeness, and I agree with the above.  - Brecklyn Galvis, FNP-C.

## 2019-01-23 ENCOUNTER — Encounter (INDEPENDENT_AMBULATORY_CARE_PROVIDER_SITE_OTHER): Payer: Self-pay | Admitting: Family Medicine

## 2019-03-28 ENCOUNTER — Telehealth: Payer: BLUE CROSS/BLUE SHIELD | Admitting: Family

## 2019-03-28 DIAGNOSIS — R05 Cough: Secondary | ICD-10-CM

## 2019-03-28 DIAGNOSIS — R059 Cough, unspecified: Secondary | ICD-10-CM

## 2019-03-28 MED ORDER — PREDNISONE 10 MG (21) PO TBPK: ORAL_TABLET | ORAL | 0 refills | Status: DC

## 2019-03-28 MED ORDER — PROMETHAZINE-DM 6.25-15 MG/5ML PO SYRP: 5.0000 mL | ORAL_SOLUTION | Freq: Four times a day (QID) | ORAL | 0 refills | Status: DC | PRN

## 2019-03-28 MED ORDER — BENZONATATE 100 MG PO CAPS: 100.0000 mg | ORAL_CAPSULE | Freq: Two times a day (BID) | ORAL | 0 refills | Status: DC | PRN

## 2019-03-28 NOTE — Progress Notes (Signed)
We are sorry that you are not feeling well.  Here is how we plan to help!  Based on your presentation I believe you most likely have A cough due to a virus.  This is called viral bronchitis and is best treated by rest, plenty of fluids and control of the cough.  You may use Ibuprofen or Tylenol as directed to help your symptoms.     In addition you may use A prescription cough medication called Tessalon Perles 100mg. You may take 1-2 capsules every 8 hours as needed for your cough.  Prednisone 10 mg daily for 6 days (see taper instructions below)  Directions for 6 day taper: Day 1: 2 tablets before breakfast, 1 after both lunch & dinner and 2 at bedtime Day 2: 1 tab before breakfast, 1 after both lunch & dinner and 2 at bedtime Day 3: 1 tab at each meal & 1 at bedtime Day 4: 1 tab at breakfast, 1 at lunch, 1 at bedtime Day 5: 1 tab at breakfast & 1 tab at bedtime Day 6: 1 tab at breakfast   From your responses in the eVisit questionnaire you describe inflammation in the upper respiratory tract which is causing a significant cough.  This is commonly called Bronchitis and has four common causes:    Allergies  Viral Infections  Acid Reflux  Bacterial Infection Allergies, viruses and acid reflux are treated by controlling symptoms or eliminating the cause. An example might be a cough caused by taking certain blood pressure medications. You stop the cough by changing the medication. Another example might be a cough caused by acid reflux. Controlling the reflux helps control the cough.  USE OF BRONCHODILATOR ("RESCUE") INHALERS: There is a risk from using your bronchodilator too frequently.  The risk is that over-reliance on a medication which only relaxes the muscles surrounding the breathing tubes can reduce the effectiveness of medications prescribed to reduce swelling and congestion of the tubes themselves.  Although you feel brief relief from the bronchodilator inhaler, your asthma may  actually be worsening with the tubes becoming more swollen and filled with mucus.  This can delay other crucial treatments, such as oral steroid medications. If you need to use a bronchodilator inhaler daily, several times per day, you should discuss this with your provider.  There are probably better treatments that could be used to keep your asthma under control.     HOME CARE . Only take medications as instructed by your medical team. . Complete the entire course of an antibiotic. . Drink plenty of fluids and get plenty of rest. . Avoid close contacts especially the very young and the elderly . Cover your mouth if you cough or cough into your sleeve. . Always remember to wash your hands . A steam or ultrasonic humidifier can help congestion.   GET HELP RIGHT AWAY IF: . You develop worsening fever. . You become short of breath . You cough up blood. . Your symptoms persist after you have completed your treatment plan MAKE SURE YOU   Understand these instructions.  Will watch your condition.  Will get help right away if you are not doing well or get worse.  Your e-visit answers were reviewed by a board certified advanced clinical practitioner to complete your personal care plan.  Depending on the condition, your plan could have included both over the counter or prescription medications. If there is a problem please reply  once you have received a response from your provider. Your   safety is important to us.  If you have drug allergies check your prescription carefully.    You can use MyChart to ask questions about today's visit, request a non-urgent call back, or ask for a work or school excuse for 24 hours related to this e-Visit. If it has been greater than 24 hours you will need to follow up with your provider, or enter a new e-Visit to address those concerns. You will get an e-mail in the next two days asking about your experience.  I hope that your e-visit has been valuable and will  speed your recovery. Thank you for using e-visits.  Greater than 5 minutes, yet less than 10 minutes of time have been spent researching, coordinating, and implementing care for this patient today.  Thank you for the details you included in the comment boxes. Those details are very helpful in determining the best course of treatment for you and help us to provide the best care.  

## 2019-03-28 NOTE — Addendum Note (Signed)
Addended by: Dutch Quint B on: 03/28/2019 02:46 PM   Modules accepted: Orders

## 2019-05-11 ENCOUNTER — Encounter (INDEPENDENT_AMBULATORY_CARE_PROVIDER_SITE_OTHER): Payer: Self-pay | Admitting: Family Medicine

## 2019-05-11 DIAGNOSIS — E559 Vitamin D deficiency, unspecified: Secondary | ICD-10-CM

## 2019-05-11 DIAGNOSIS — I10 Essential (primary) hypertension: Secondary | ICD-10-CM

## 2019-05-11 MED ORDER — AMLODIPINE BESYLATE 10 MG PO TABS: 10.0000 mg | ORAL_TABLET | Freq: Every day | ORAL | 1 refills | Status: DC

## 2019-05-11 MED ORDER — VITAMIN D (ERGOCALCIFEROL) 1.25 MG (50000 UNIT) PO CAPS: 50000.0000 [IU] | ORAL_CAPSULE | ORAL | 1 refills | Status: DC

## 2019-05-11 NOTE — Telephone Encounter (Signed)
See note from patient

## 2019-05-11 NOTE — Telephone Encounter (Signed)
Please advise 

## 2019-06-04 ENCOUNTER — Other Ambulatory Visit: Payer: Self-pay

## 2019-06-04 ENCOUNTER — Ambulatory Visit: Payer: BLUE CROSS/BLUE SHIELD | Admitting: Obstetrics & Gynecology

## 2019-06-04 ENCOUNTER — Encounter: Payer: Self-pay | Admitting: Obstetrics & Gynecology

## 2019-06-04 VITALS — BP 167/84 | HR 72 | Wt 335.6 lb

## 2019-06-04 DIAGNOSIS — Z113 Encounter for screening for infections with a predominantly sexual mode of transmission: Secondary | ICD-10-CM

## 2019-06-04 DIAGNOSIS — Z8742 Personal history of other diseases of the female genital tract: Secondary | ICD-10-CM | POA: Diagnosis not present

## 2019-06-04 DIAGNOSIS — R102 Pelvic and perineal pain: Secondary | ICD-10-CM | POA: Diagnosis not present

## 2019-06-04 DIAGNOSIS — N898 Other specified noninflammatory disorders of vagina: Secondary | ICD-10-CM | POA: Diagnosis not present

## 2019-06-04 NOTE — Progress Notes (Signed)
GYNECOLOGY OFFICE VISIT NOTE  History:   Molly Archer is a 38 y.o. G0P0000 here today for evaluation of episode of pelvic pain that occurred last month. No current pain now, but patient is worried about her history of ovarian cyst and endometrial hyperplasia.  She has a long history of AUB and endometrial hyperplasia successfully treated with oral progestin therapy. No bleeding for several months due to progestin therapy. She denies any current abnormal vaginal discharge, bleeding, pelvic pain or other concerns.    Past Medical History:  Diagnosis Date  . Anemia   . Asthma    only uses inhaler in fall and spring  . Constipation   . GERD (gastroesophageal reflux disease)    diet controlled, occ. uses omeprazole  . Headache    otc med prn  . History of kidney stones   . Hx of Bell's palsy June 2013  . Hypertension    currently no meds  . Menometrorrhagia   . Prediabetes   . Psoriasis   . Shortness of breath dyspnea    with exercise/exertion  . Vitamin D deficiency     Past Surgical History:  Procedure Laterality Date  . COMBINED HYSTEROSCOPY DIAGNOSTIC / D&C N/A 2011   Benign secretory endometrium  . CYSTOSCOPY WITH STENT PLACEMENT Left 10/19/2014   Procedure: CYSTOSCOPY WITH STENT PLACEMENT;  Surgeon: Ardis Hughs, MD;  Location: [facility];  Service: Urology;  Laterality: Left;  . DILATION AND CURETTAGE OF UTERUS  10/2003   Polyp, mild atypia, simple and complex hyperplasia,  . DILATION AND CURETTAGE OF UTERUS  11/2003   Simple and complex hyperplasia  . DILATION AND CURETTAGE OF UTERUS  2011  . DILATION AND CURETTAGE OF UTERUS N/A 12/14/2014   Procedure: DILATATION AND CURETTAGE;  Surgeon: Mora Bellman, MD;  Location: [facility];  Service: Gynecology;  Laterality: N/A;  . HOLMIUM LASER APPLICATION Left 0000000   Procedure: HOLMIUM LASER APPLICATION;  Surgeon: Ardis Hughs, MD;  Location: [facility];  Service: Urology;  Laterality: Left;  . HYSTEROSCOPY W/D&C N/A  07/21/2018   Procedure: DILATATION AND CURETTAGE /HYSTEROSCOPY;  Surgeon: Woodroe Mode, MD;  Location: [facility];  Service: Gynecology;  Laterality: N/A;  . INTRAUTERINE DEVICE (IUD) INSERTION N/A 12/14/2014   Procedure: INTRAUTERINE DEVICE (IUD) INSERTION;  Surgeon: Mora Bellman, MD;  Location: [facility];  Service: Gynecology;  Laterality: N/A;  . STONE EXTRACTION WITH BASKET Left 10/19/2014   Procedure: STONE EXTRACTION WITH BASKET;  Surgeon: Ardis Hughs, MD;  Location: [facility];  Service: Urology;  Laterality: Left;  . WISDOM TOOTH EXTRACTION      The following portions of the patient's history were reviewed and updated as appropriate: allergies, current medications, past family history, past medical history, past social history, past surgical history and problem list.   Health Maintenance:  Normal pap and negative HRHPV on 06/09/2018.    Review of Systems:  Pertinent items noted in HPI and remainder of comprehensive ROS otherwise negative.  Physical Exam:  BP (!) 167/84   Pulse 72   Wt (!) 335 lb 9.6 oz (152.2 kg)   LMP 06/29/2018   BMI 57.61 kg/m  CONSTITUTIONAL: Well-developed, well-nourished female in no acute distress.  HEENT:  Normocephalic, atraumatic. External right and left ear normal. No scleral icterus.  NECK: Normal range of motion, supple, no masses noted on observation SKIN: No rash noted. Not diaphoretic. No erythema. No pallor. MUSCULOSKELETAL: Normal range of motion. No edema noted. NEUROLOGIC: Alert and oriented to person,  place, and time. Normal muscle tone coordination. No cranial nerve deficit noted. PSYCHIATRIC: Normal mood and affect. Normal behavior. Normal judgment and thought content. CARDIOVASCULAR: Normal heart rate noted RESPIRATORY: Effort and breath sounds normal, no problems with respiration noted ABDOMEN: Obese. No masses noted. No other overt distention noted.   PELVIC: Normal appearing external genitalia; normal appearing vaginal mucosa and  cervix.  Scant white discharge noted, testing sample obtained.  Unable to palpate uterus or adnexa on bimanual due to habitus    Assessment and Plan:    1. Pelvic pain in female 2. History of endometrial hyperplasia Unsure etiology of pain, will check discharge analysis. Also check pelvic ultrasound. - Cervicovaginal ancillary only( [place]) - [place] GYN Pelvis Complete with Transvaginal; Future Follow up results and manage accordingly.  Routine preventative health maintenance measures emphasized. Please refer to After Visit Summary for other counseling recommendations.   Return for any gynecologic concerns.    Total face-to-face time with patient: 15 minutes.  Over 50% of encounter was spent on counseling and coordination of care.   Verita Schneiders, MD, [place] for [employer], [place]

## 2019-06-04 NOTE — Patient Instructions (Signed)
Return to clinic for any scheduled appointments or for any gynecologic concerns as needed.   

## 2019-06-05 LAB — CERVICOVAGINAL ANCILLARY ONLY
Bacterial Vaginitis (gardnerella): NEGATIVE
Candida Glabrata: NEGATIVE
Candida Vaginitis: NEGATIVE
Chlamydia: NEGATIVE
Comment: NEGATIVE
Comment: NEGATIVE
Comment: NEGATIVE
Comment: NEGATIVE
Comment: NEGATIVE
Comment: NORMAL
Neisseria Gonorrhea: NEGATIVE
Trichomonas: NEGATIVE

## 2019-06-10 ENCOUNTER — Ambulatory Visit: Payer: BLUE CROSS/BLUE SHIELD | Admitting: Obstetrics & Gynecology

## 2019-06-11 ENCOUNTER — Ambulatory Visit (HOSPITAL_COMMUNITY): Payer: BLUE CROSS/BLUE SHIELD

## 2019-07-22 ENCOUNTER — Other Ambulatory Visit: Payer: Self-pay | Admitting: Obstetrics and Gynecology

## 2019-07-22 DIAGNOSIS — G8918 Other acute postprocedural pain: Secondary | ICD-10-CM

## 2019-07-22 MED ORDER — IBUPROFEN 800 MG PO TABS: 800.0000 mg | ORAL_TABLET | Freq: Three times a day (TID) | ORAL | 1 refills | Status: DC | PRN

## 2019-09-08 DIAGNOSIS — M222X2 Patellofemoral disorders, left knee: Secondary | ICD-10-CM | POA: Insufficient documentation

## 2019-09-08 DIAGNOSIS — M2392 Unspecified internal derangement of left knee: Secondary | ICD-10-CM | POA: Insufficient documentation

## 2019-11-17 DIAGNOSIS — M1712 Unilateral primary osteoarthritis, left knee: Secondary | ICD-10-CM | POA: Insufficient documentation

## 2019-12-09 DIAGNOSIS — Z0289 Encounter for other administrative examinations: Secondary | ICD-10-CM | POA: Insufficient documentation

## 2019-12-22 DIAGNOSIS — S83272A Complex tear of lateral meniscus, current injury, left knee, initial encounter: Secondary | ICD-10-CM | POA: Insufficient documentation

## 2020-04-08 ENCOUNTER — Other Ambulatory Visit: Payer: Self-pay | Admitting: Critical Care Medicine

## 2020-04-08 ENCOUNTER — Other Ambulatory Visit: Payer: BLUE CROSS/BLUE SHIELD

## 2020-04-08 DIAGNOSIS — Z20822 Contact with and (suspected) exposure to covid-19: Secondary | ICD-10-CM

## 2020-04-11 LAB — NOVEL CORONAVIRUS, NAA: SARS-CoV-2, NAA: NOT DETECTED

## 2020-05-02 DIAGNOSIS — G43009 Migraine without aura, not intractable, without status migrainosus: Secondary | ICD-10-CM | POA: Insufficient documentation

## 2020-05-27 ENCOUNTER — Other Ambulatory Visit (INDEPENDENT_AMBULATORY_CARE_PROVIDER_SITE_OTHER): Payer: Self-pay | Admitting: Family Medicine

## 2020-05-27 DIAGNOSIS — I10 Essential (primary) hypertension: Secondary | ICD-10-CM

## 2020-08-10 DIAGNOSIS — E119 Type 2 diabetes mellitus without complications: Secondary | ICD-10-CM | POA: Insufficient documentation

## 2020-08-10 IMAGING — US US TRANSVAGINAL NON-OB
1 series · 15 of 25 positions shown · non-contrast
Comparison: 01/12/2015

CLINICAL DATA: Dysfunctional uterine bleeding.

EXAM:
ULTRASOUND PELVIS TRANSVAGINAL
TECHNIQUE: Transvaginal ultrasound examination of the pelvis was performed
including evaluation of the uterus, ovaries, adnexal regions, and
pelvic cul-de-sac.

[Series 1: us transvaginal non-ob · 98 acquisitions, 15 frames shown]
[im 1/98]
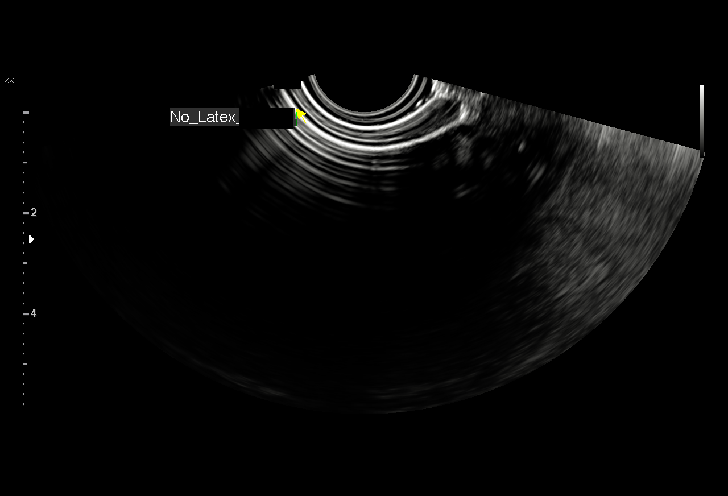
[im 9/98]
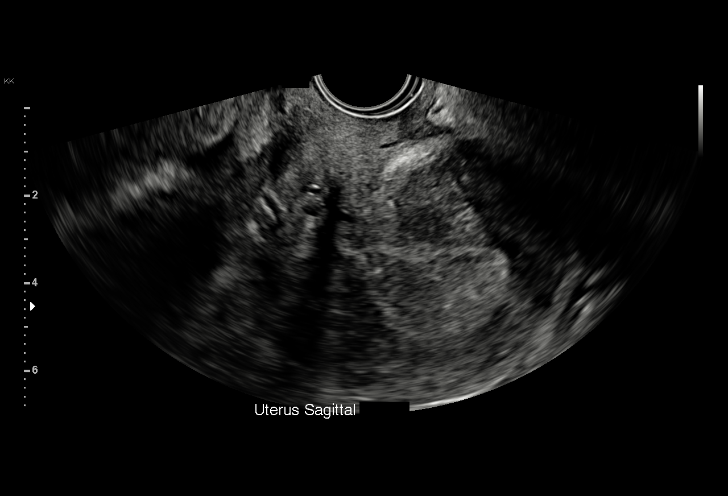
[im 17/98]
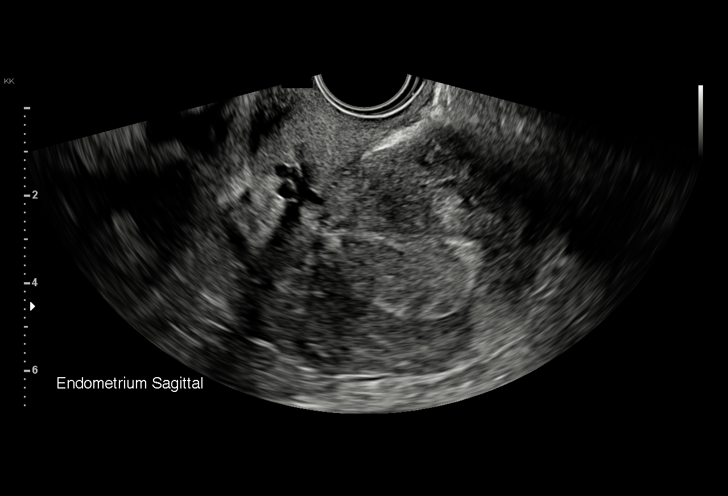
[im 21/98]
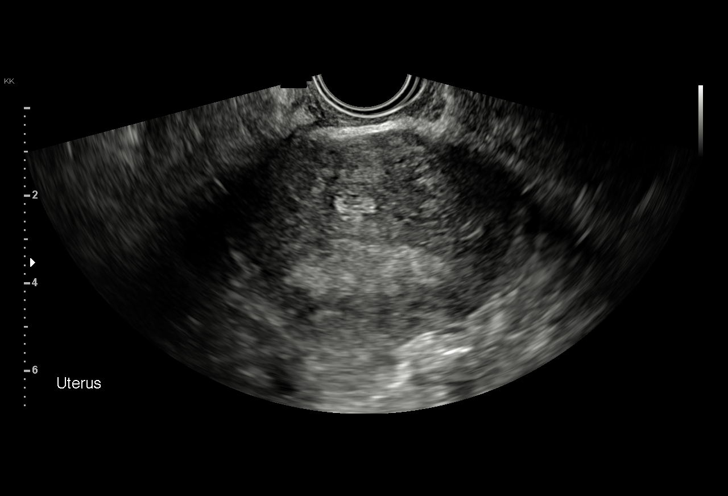
[im 29/98]
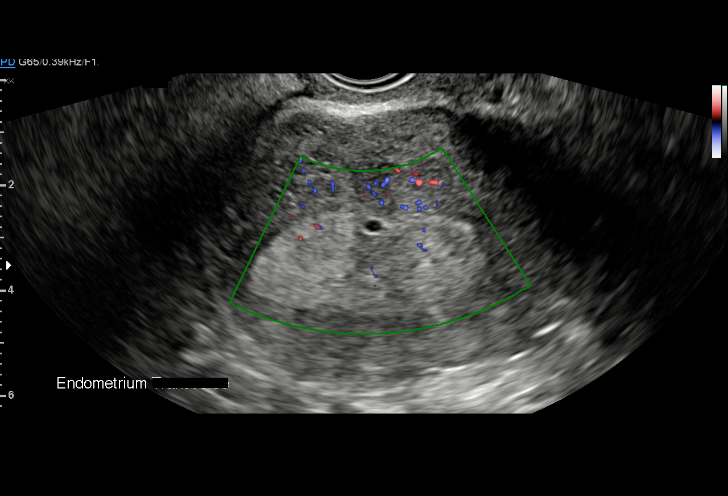
[im 37/98]
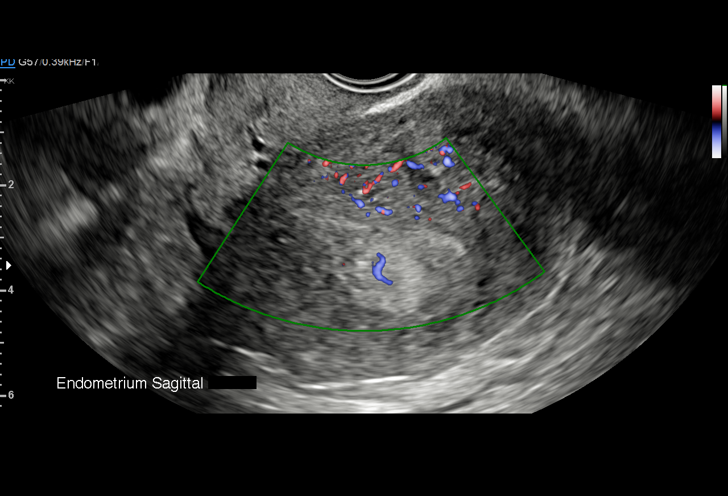
[im 41/98]
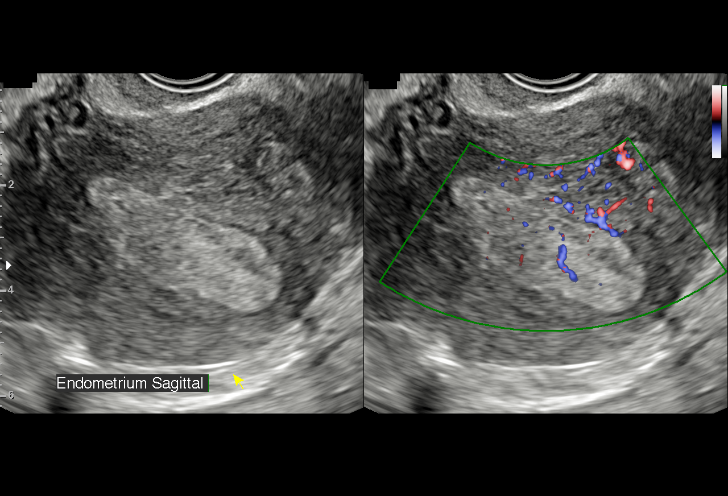
[im 49/98]
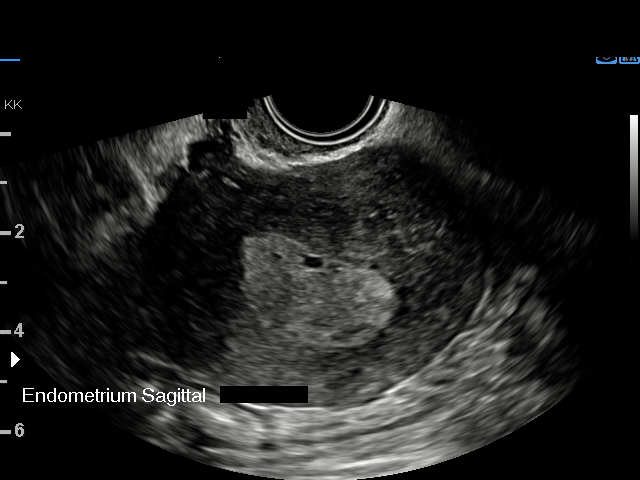
[im 57/98]
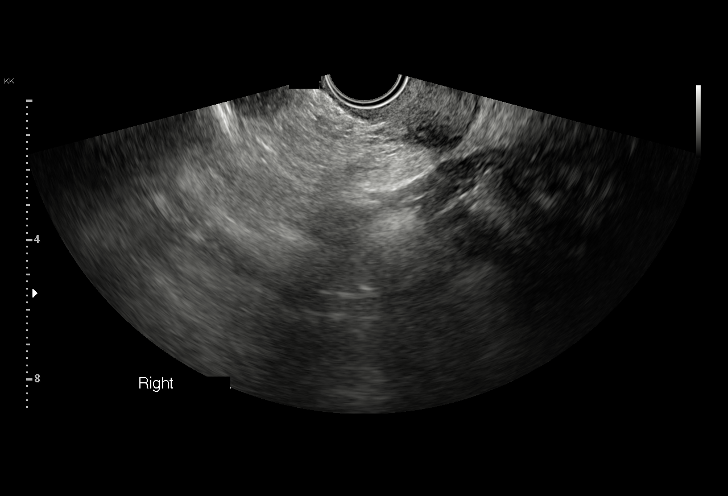
[im 61/98]
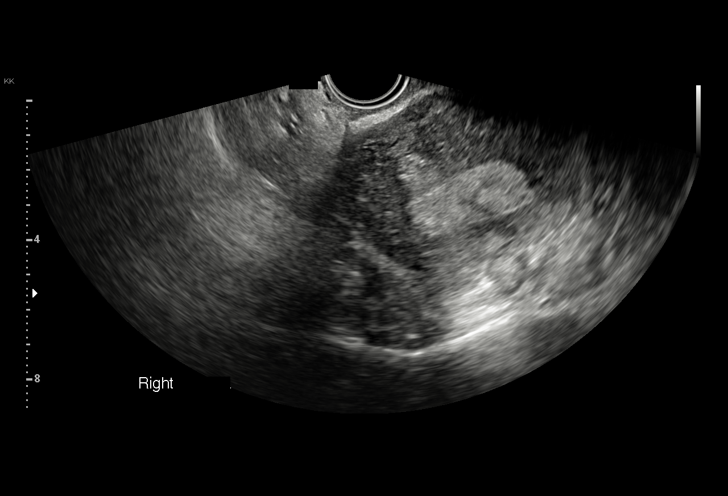
[im 69/98]
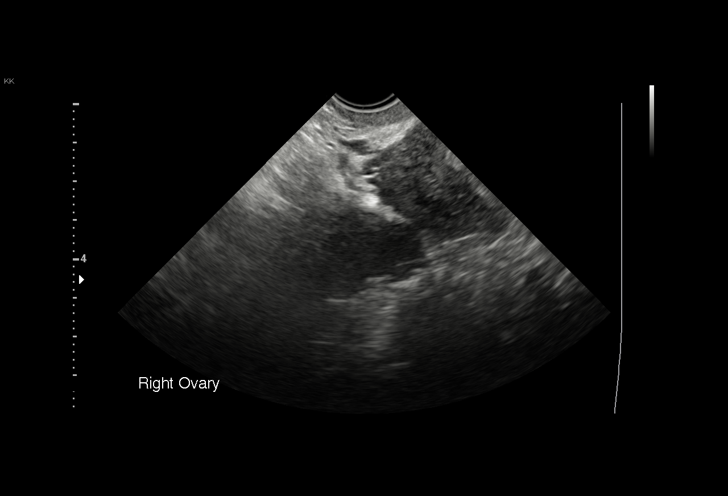
[im 77/98]
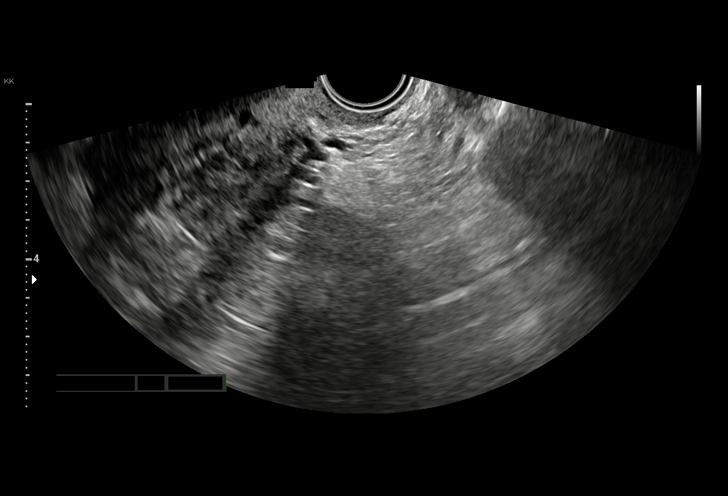
[im 81/98]
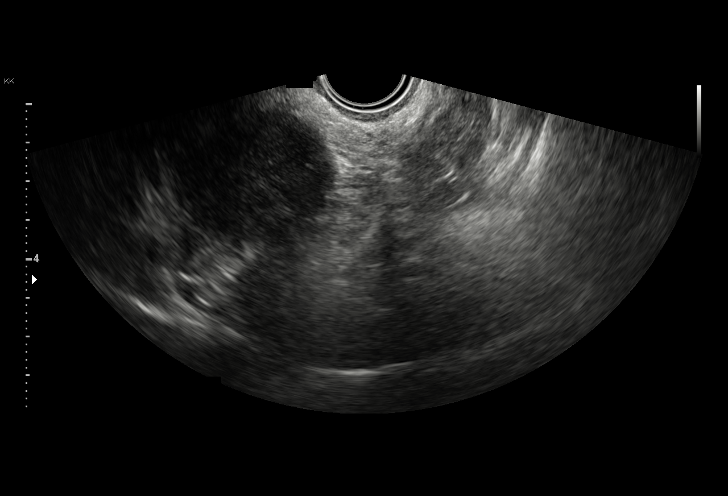
[im 89/98]
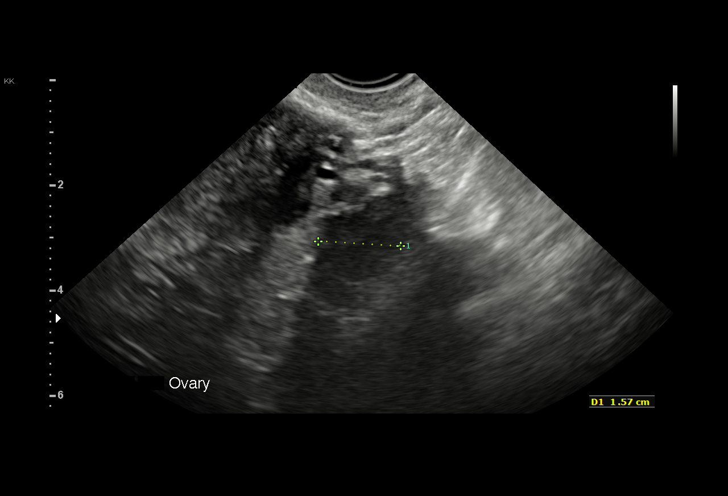
[im 98/98]
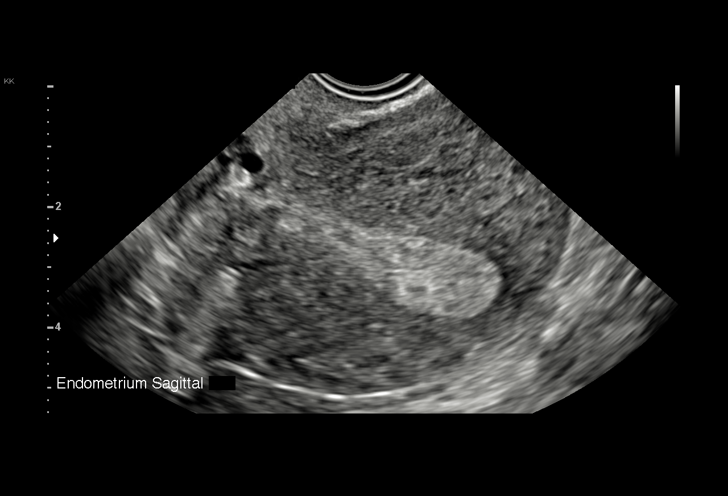

[15 of 25 positions shown; findings below may reference images not displayed]

FINDINGS: Uterus

Measurements: 9.0 x 6.1 x 6.4 cm. No fibroids or other mass
visualized.

Endometrium

Thickness: 16 mm in thickness. There appear to be 2 hyperechoic
solid areas within the endometrium, on the left measuring 2.1 x
x 1.4 cm and on the right measuring 2.1 x 1.8 x 1.3 cm..

Right ovary

Measurements: 3.4 x 2.0 x 2.6 cm. Normal appearance/no adnexal mass.

Left ovary

Measurements: 2.7 x 1.8 x 1.6 cm. Normal appearance/no adnexal mass.

Other findings:  Small amount of free fluid in the pelvis.
IMPRESSION: Endometrium upper limits normal in thickness at 16 mm. There appear
to be 2 hyperechoic solid nodules within the endometrium. Consider
further evaluation with sonohysterogram for confirmation prior to
hysteroscopy. Endometrial sampling should also be considered if
patient is at high risk for endometrial carcinoma. (Ref:
Radiological Reasoning: Algorithmic Workup of Abnormal Vaginal
Bleeding with Endovaginal Sonography and Sonohysterography. AJR
2991; 191:S68-73)

## 2020-09-09 ENCOUNTER — Ambulatory Visit
Admission: RE | Admit: 2020-09-09 | Discharge: 2020-09-09 | Disposition: A | Discharge status: Home / Self Care | Payer: BC Managed Care – PPO | Source: Ambulatory Visit | Attending: Orthopaedic Surgery | Admitting: Orthopaedic Surgery

## 2020-09-09 ENCOUNTER — Other Ambulatory Visit: Payer: Self-pay | Admitting: Orthopaedic Surgery

## 2020-09-09 DIAGNOSIS — M9262 Juvenile osteochondrosis of tarsus, left ankle: Secondary | ICD-10-CM

## 2020-09-09 DIAGNOSIS — M2392 Unspecified internal derangement of left knee: Secondary | ICD-10-CM

## 2020-09-12 ENCOUNTER — Other Ambulatory Visit: Payer: Self-pay | Admitting: Orthopaedic Surgery

## 2020-09-12 ENCOUNTER — Other Ambulatory Visit: Payer: Self-pay

## 2020-09-12 ENCOUNTER — Ambulatory Visit
Admission: RE | Admit: 2020-09-12 | Discharge: 2020-09-12 | Disposition: A | Discharge status: Home / Self Care | Payer: BC Managed Care – PPO | Source: Ambulatory Visit | Attending: Orthopaedic Surgery | Admitting: Orthopaedic Surgery

## 2020-09-12 DIAGNOSIS — M9262 Juvenile osteochondrosis of tarsus, left ankle: Secondary | ICD-10-CM

## 2020-09-28 IMAGING — US US SONOHYSTEROGRAM - WITH RAD
1 series · 12 of 22 positions shown · non-contrast
Comparison: 05/06/2018 pelvic sonogram.

CLINICAL DATA: 37-year-old female with dysfunctional uterine
bleeding. Question focal endometrial lesions on recent pelvic
sonogram.

EXAM:
US SONOHYSTEROGRAM
TECHNIQUE: Following cleansing of the cervix and vagina with Betadine, a
hysterosalpingogram catheter was placed within the endocervical
canal. Sonohysterogram was then performed with transvaginal
sonography during infusion of sterile saline solution into the
endometrial cavity.

[Series 1: us sonohysterogram - with rad · 22 acquisitions, 12 frames shown]
[im 1/22]
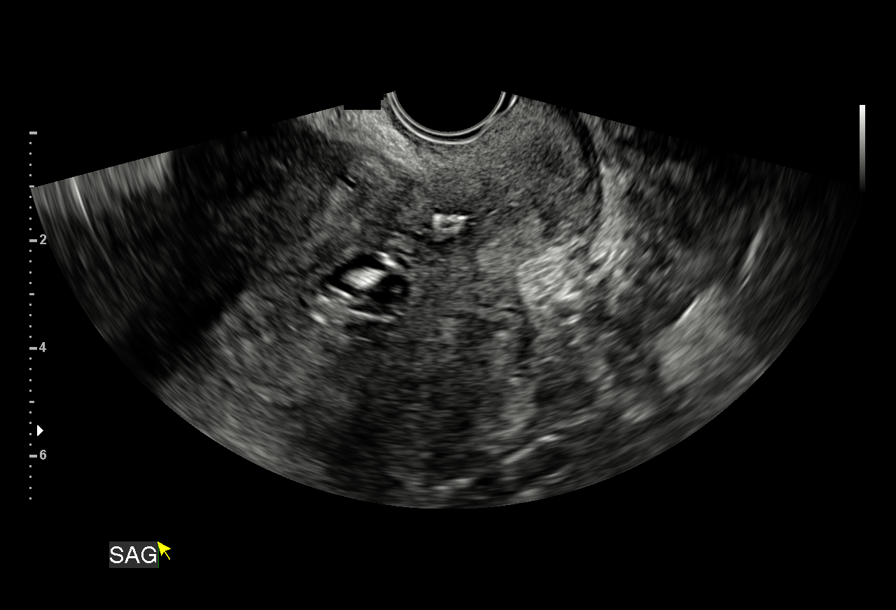
[im 3/22]
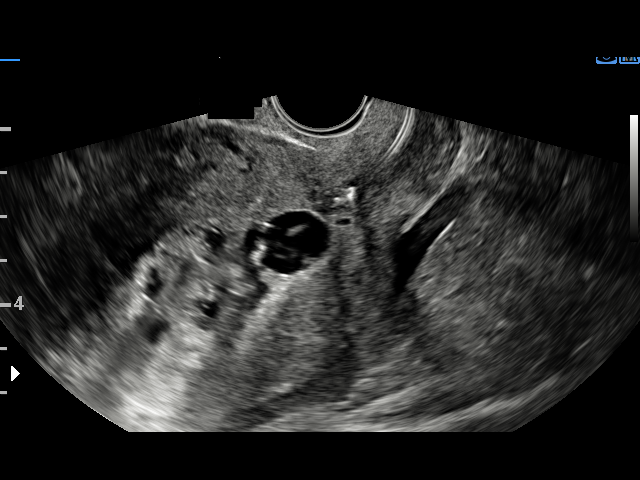
[im 5/22]
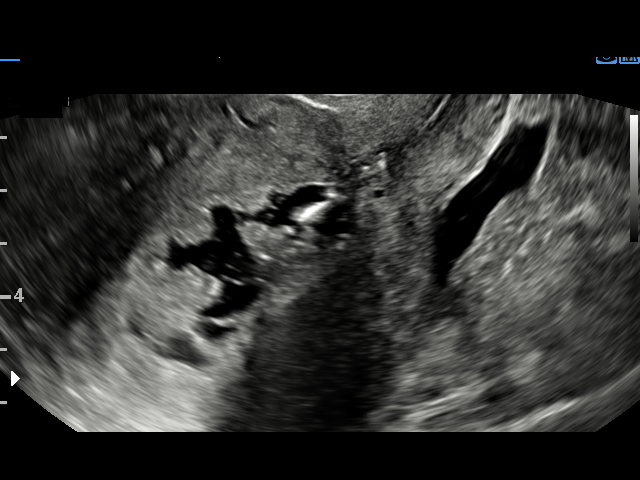
[im 7/22]
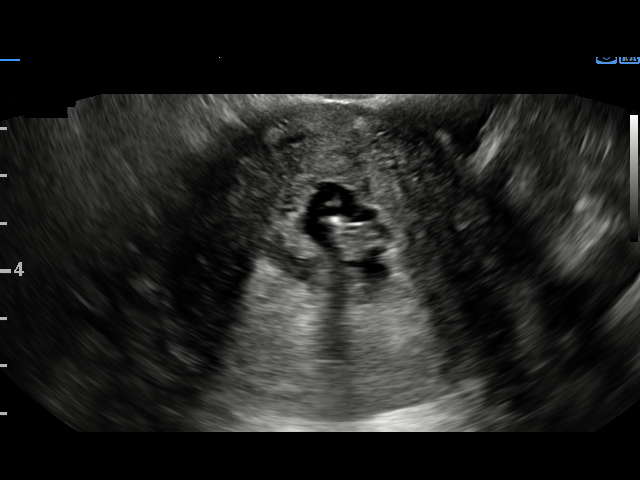
[im 9/22]
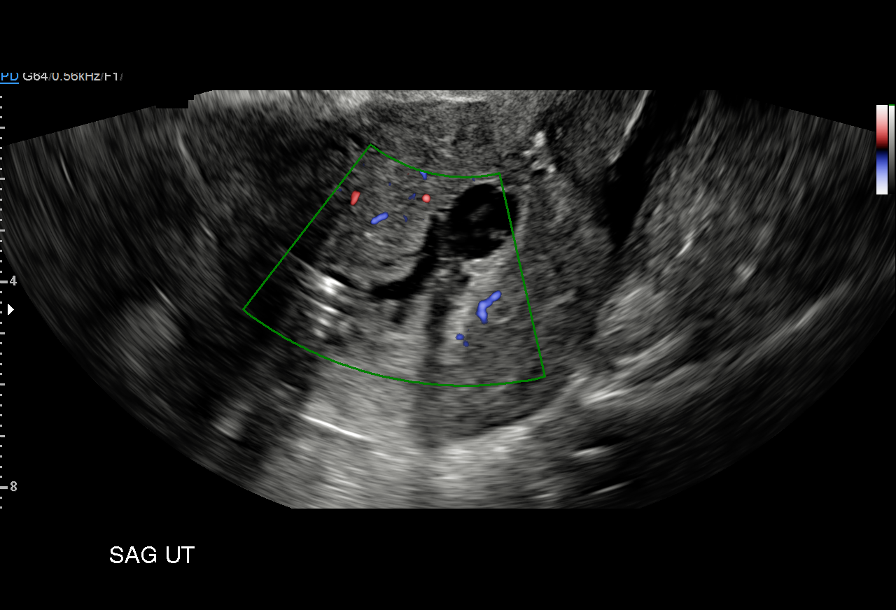
[im 11/22]
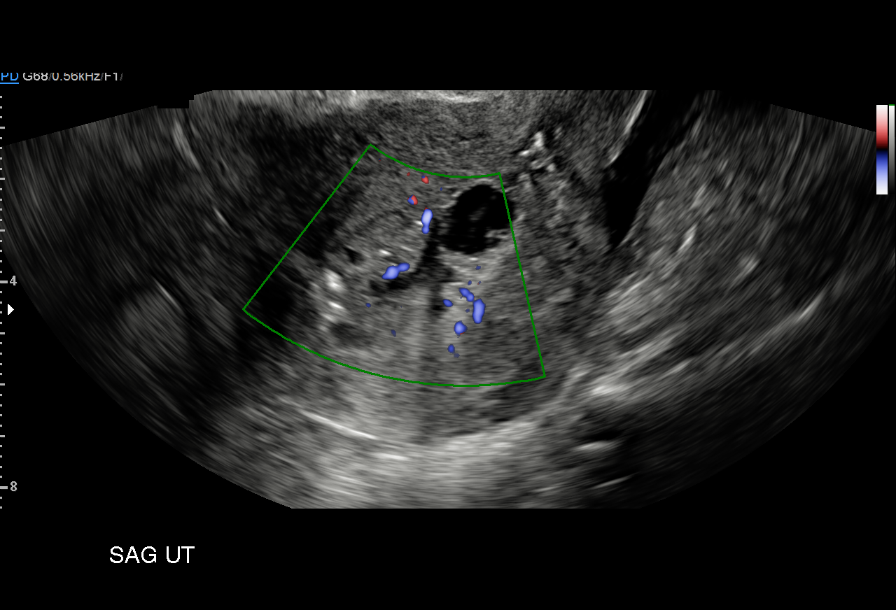
[im 12/22]
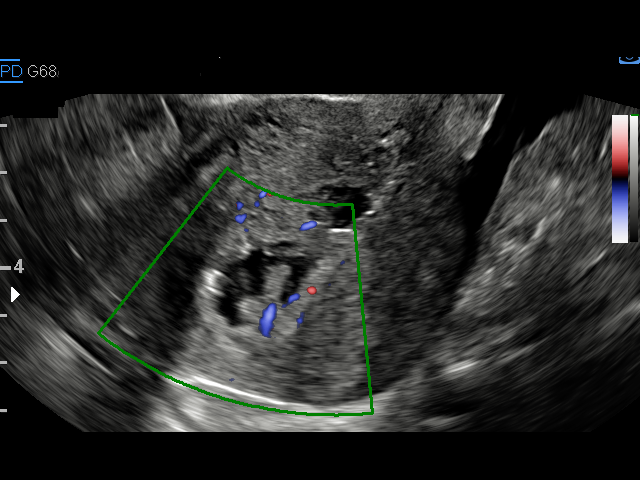
[im 14/22]
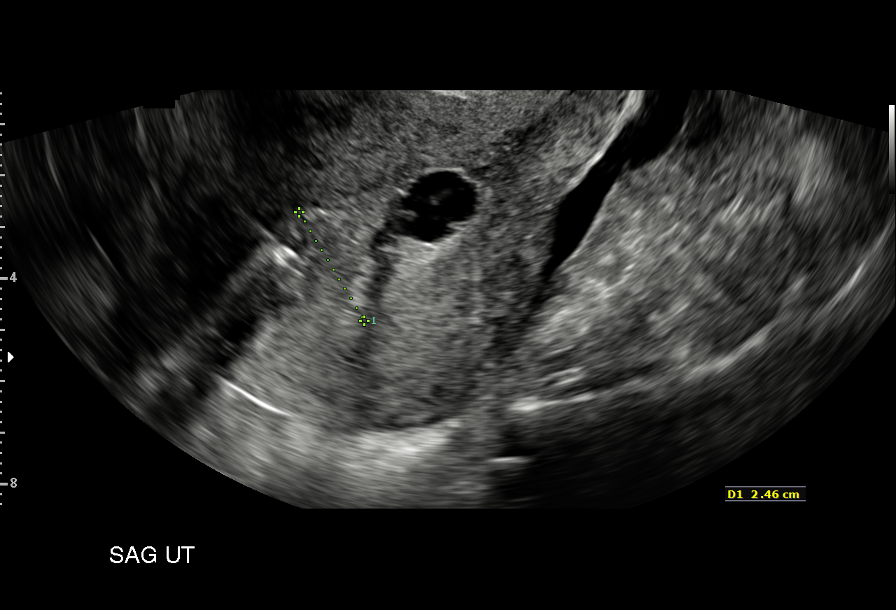
[im 16/22]
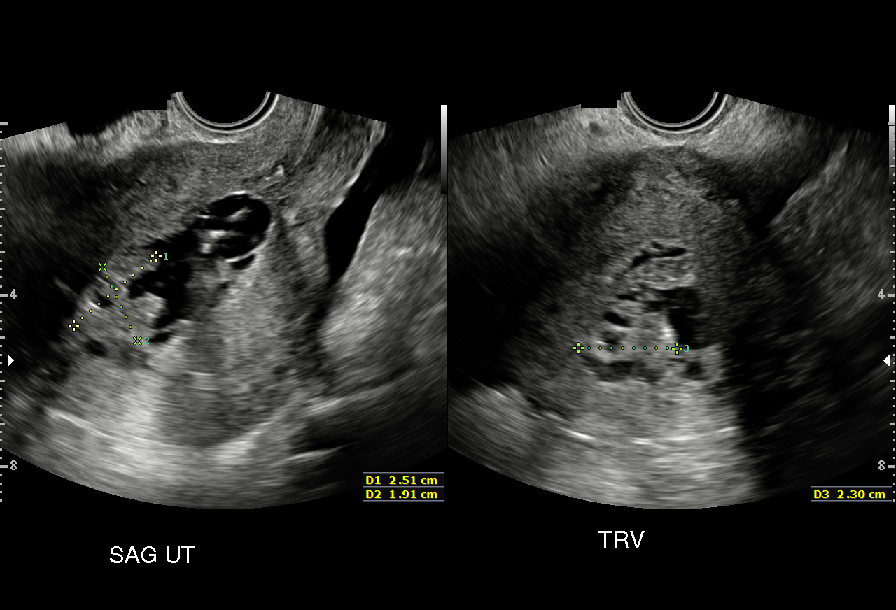
[im 18/22]
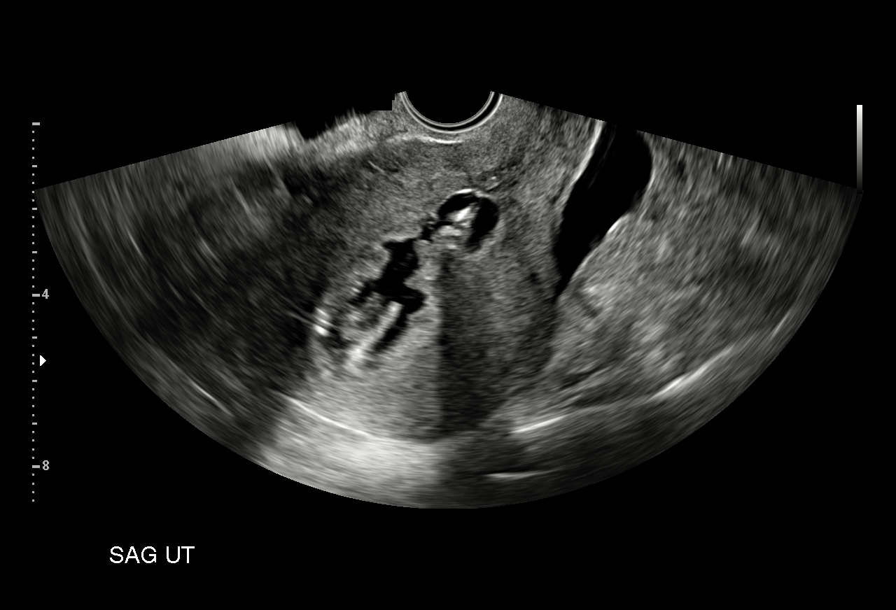
[im 20/22]
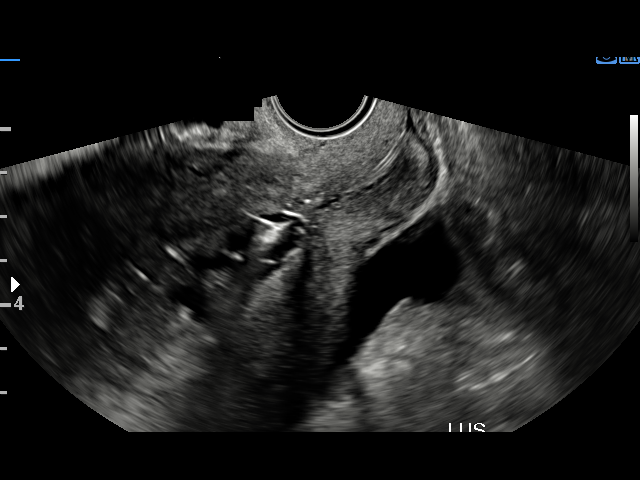
[im 22/22]
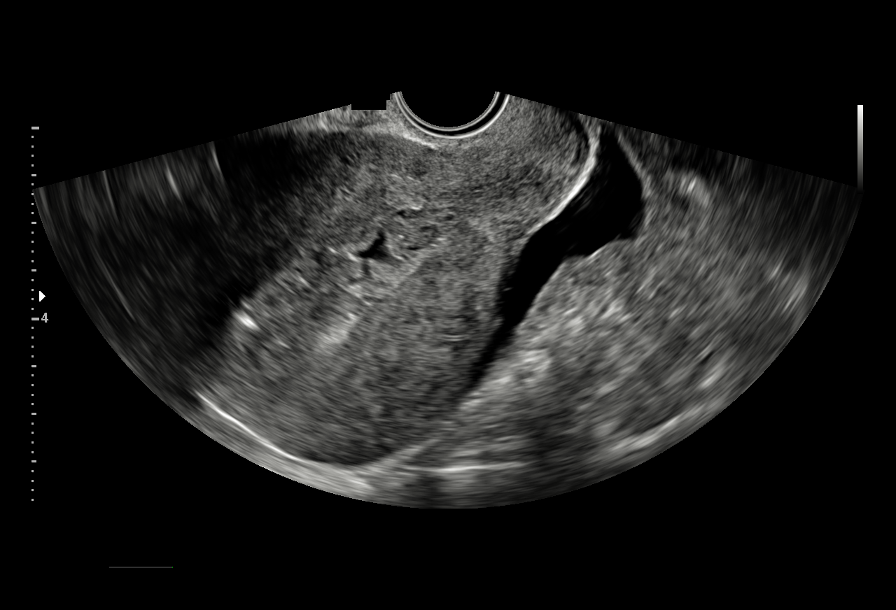

[12 of 22 positions shown; findings below may reference images not displayed]

FINDINGS: Sonohysterography demonstrates prominent very irregular soft tissue
thickening of the entire endometrial cavity, without a discrete
mass. Bilayer endometrial thickness 25 mm on today's scan. There is
scattered vascularity throughout the regions of soft tissue
thickening in the endometrial cavity on color Doppler, without a
dominant feeding vessel. An asymmetric 2.5 x 1.9 x 2.3 cm irregular
focus of soft tissue thickening in the right fundal endometrial
cavity is contiguous with the irregular regions of soft tissue
thickening throughout the uterine cavity, and is not considered a
discrete endometrial mass. No submucosal or intracavitary fibroids.

The procedure was well tolerated by the patient, with no immediate
complication.
IMPRESSION: Very irregular and prominent soft tissue thickening of the entire
endometrial cavity with associated scattered endometrial vascularity
on color Doppler, without discrete endometrial mass. Bilayer
endometrial thickness 25 mm. Imaging differential diagnosis includes
endometrial hyperplasia or endometrial carcinoma. Consider
endometrial tissue sampling.

## 2021-01-24 ENCOUNTER — Other Ambulatory Visit: Payer: Self-pay | Admitting: Orthopaedic Surgery

## 2021-01-24 DIAGNOSIS — M25562 Pain in left knee: Secondary | ICD-10-CM

## 2021-02-07 ENCOUNTER — Ambulatory Visit
Admission: RE | Admit: 2021-02-07 | Discharge: 2021-02-07 | Disposition: A | Discharge status: Home / Self Care | Payer: BC Managed Care – PPO | Source: Ambulatory Visit | Attending: Orthopaedic Surgery | Admitting: Orthopaedic Surgery

## 2021-02-07 DIAGNOSIS — M25562 Pain in left knee: Secondary | ICD-10-CM

## 2021-09-19 ENCOUNTER — Other Ambulatory Visit: Payer: Self-pay | Admitting: Orthopedic Surgery

## 2021-09-19 DIAGNOSIS — M545 Low back pain, unspecified: Secondary | ICD-10-CM

## 2021-10-07 ENCOUNTER — Ambulatory Visit
Admission: RE | Admit: 2021-10-07 | Discharge: 2021-10-07 | Disposition: A | Discharge status: Home / Self Care | Payer: BC Managed Care – PPO | Source: Ambulatory Visit | Attending: Orthopedic Surgery | Admitting: Orthopedic Surgery

## 2021-10-07 ENCOUNTER — Other Ambulatory Visit: Payer: BC Managed Care – PPO

## 2021-10-07 DIAGNOSIS — M545 Low back pain, unspecified: Secondary | ICD-10-CM

## 2022-03-07 ENCOUNTER — Encounter (INDEPENDENT_AMBULATORY_CARE_PROVIDER_SITE_OTHER): Payer: Self-pay

## 2022-09-19 DIAGNOSIS — F5101 Primary insomnia: Secondary | ICD-10-CM | POA: Insufficient documentation

## 2022-10-19 LAB — GLUCOSE, POCT (MANUAL RESULT ENTRY): POC Glucose: 116 mg/dl — AB (ref 70–99)

## 2022-10-22 ENCOUNTER — Other Ambulatory Visit: Payer: Self-pay

## 2022-10-22 DIAGNOSIS — Z139 Encounter for screening, unspecified: Secondary | ICD-10-CM

## 2022-10-28 NOTE — Congregational Nurse Program (Signed)
10/19/22--Seen on 10/19/22 at health fair at [place] and [facility], bs and bp done  Pt education , contact your PCP, lower blood pressure, lower cholesterol, eat health diet, manage weight, increase exercise, reduce stress, take medication as prescribed, Will follow up.  Vinnie Langton, RN, [facility], [phone].

## 2022-10-28 NOTE — Congregational Nurse Program (Signed)
10/19/22--Seen on 10/19/22 at health fair at [place] [facility], bs and bp done  Pt education , contact your PCP, lower blood pressure, lower cholesterol, eat health diet, manage weight, increase exercise, reduce stress, take medication as prescribed, Will follow up.  Vinnie Langton, RN, [facility], [phone].

## 2023-01-16 ENCOUNTER — Ambulatory Visit (HOSPITAL_BASED_OUTPATIENT_CLINIC_OR_DEPARTMENT_OTHER): Payer: 59 | Admitting: Orthopaedic Surgery

## 2023-01-16 DIAGNOSIS — M67951 Unspecified disorder of synovium and tendon, right thigh: Secondary | ICD-10-CM

## 2023-01-16 DIAGNOSIS — M79651 Pain in right thigh: Secondary | ICD-10-CM

## 2023-01-16 DIAGNOSIS — M67959 Unspecified disorder of synovium and tendon, unspecified thigh: Secondary | ICD-10-CM

## 2023-01-16 MED ORDER — TRIAMCINOLONE ACETONIDE 40 MG/ML IJ SUSP
80.0000 mg | INTRAMUSCULAR | Status: AC | PRN
  Administered 2023-01-16: 80 mg via INTRA_ARTICULAR

## 2023-01-16 MED ORDER — LIDOCAINE HCL 1 % IJ SOLN
4.0000 mL | INTRAMUSCULAR | Status: AC | PRN
  Administered 2023-01-16: 4 mL
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
## 2023-01-29 ENCOUNTER — Other Ambulatory Visit (HOSPITAL_BASED_OUTPATIENT_CLINIC_OR_DEPARTMENT_OTHER): Payer: Self-pay

## 2023-01-29 ENCOUNTER — Encounter (HOSPITAL_BASED_OUTPATIENT_CLINIC_OR_DEPARTMENT_OTHER): Payer: Self-pay | Admitting: Student

## 2023-01-29 ENCOUNTER — Ambulatory Visit (INDEPENDENT_AMBULATORY_CARE_PROVIDER_SITE_OTHER): Payer: 59 | Admitting: Student

## 2023-01-29 DIAGNOSIS — M67959 Unspecified disorder of synovium and tendon, unspecified thigh: Secondary | ICD-10-CM

## 2023-01-29 DIAGNOSIS — M5441 Lumbago with sciatica, right side: Secondary | ICD-10-CM

## 2023-01-29 MED ORDER — TRAMADOL HCL 50 MG PO TABS
50.0000 mg | ORAL_TABLET | Freq: Four times a day (QID) | ORAL | 0 refills | Status: AC | PRN
  Filled 2023-01-29: qty 20, 5d supply, fill #0

## 2023-01-29 NOTE — Progress Notes (Signed)
Chief Complaint: Low back and right leg pain     History of Present Illness:    Molly Archer is a 42 y.o. female presenting today with chief complaint of radiating pain down the back of her right leg.  This began 3 days ago without any known injury or overuse.  She did get a right greater trochanter injection which she did get significant relief from until pain returned 3 days ago.  States that her low back has a nagging pain but the pain going down the right leg is 10 out of 10.  Often feels like her right leg wants to give out.  Denies any numbness or tingling.  She is alternating ibuprofen and Tylenol as well as taking tizanidine at night.  She is scheduled to start physical therapy on 02/20/2023.  Denies any bowel or bladder dysfunction.   Surgical History:   None  PMH/PSH/Family History/Social History/Meds/Allergies:    Past Medical History:  Diagnosis Date   Anemia    Asthma    only uses inhaler in fall and spring   Constipation    GERD (gastroesophageal reflux disease)    diet controlled, occ. uses omeprazole   Headache    otc med prn   History of kidney stones    Hx of Bell's palsy June 2013   Hypertension    currently no meds   Menometrorrhagia    Prediabetes    Psoriasis    Shortness of breath dyspnea    with exercise/exertion   Vitamin D deficiency    Past Surgical History:  Procedure Laterality Date   COMBINED HYSTEROSCOPY DIAGNOSTIC / D&C N/A 2011   Benign secretory endometrium   CYSTOSCOPY WITH STENT PLACEMENT Left 10/19/2014   Procedure: CYSTOSCOPY WITH STENT PLACEMENT;  Surgeon: Crist Fat, MD;  Location: [facility];  Service: Urology;  Laterality: Left;   DILATION AND CURETTAGE OF UTERUS  10/2003   Polyp, mild atypia, simple and complex hyperplasia,   DILATION AND CURETTAGE OF UTERUS  11/2003   Simple and complex hyperplasia   DILATION AND CURETTAGE OF UTERUS  2011   DILATION AND CURETTAGE OF UTERUS N/A  12/14/2014   Procedure: DILATATION AND CURETTAGE;  Surgeon: Catalina Antigua, MD;  Location: [facility];  Service: Gynecology;  Laterality: N/A;   HOLMIUM LASER APPLICATION Left 10/19/2014   Procedure: HOLMIUM LASER APPLICATION;  Surgeon: Crist Fat, MD;  Location: [facility];  Service: Urology;  Laterality: Left;   HYSTEROSCOPY WITH D & C N/A 07/21/2018   Procedure: DILATATION AND CURETTAGE /HYSTEROSCOPY;  Surgeon: Adam Phenix, MD;  Location: [facility];  Service: Gynecology;  Laterality: N/A;   INTRAUTERINE DEVICE (IUD) INSERTION N/A 12/14/2014   Procedure: INTRAUTERINE DEVICE (IUD) INSERTION;  Surgeon: Catalina Antigua, MD;  Location: [facility];  Service: Gynecology;  Laterality: N/A;   STONE EXTRACTION WITH BASKET Left 10/19/2014   Procedure: STONE EXTRACTION WITH BASKET;  Surgeon: Crist Fat, MD;  Location: [facility];  Service: Urology;  Laterality: Left;   WISDOM TOOTH EXTRACTION     Social History   Socioeconomic History   Marital status: Single    Spouse name: Not on file   Number of children: Not on file   Years of education: Not on file   Highest education level: Not on file  Occupational History  Not on file  Tobacco Use   Smoking status: Never   Smokeless tobacco: Never  Vaping Use   Vaping Use: Never used  Substance and Sexual Activity   Alcohol use: No   Drug use: No   Sexual activity: Not Currently    Birth control/protection: None  Other Topics Concern   Not on file  Social History Narrative   She is from [place], [place], moved here 1998.    Lives at home by herself.    Social Determinants of Health   Financial Resource Strain: Low Risk  (04/17/2018)   Overall Financial Resource Strain (CARDIA)    Difficulty of Paying Living Expenses: Not very hard  Food Insecurity: No Food Insecurity (04/17/2018)   Hunger Vital Sign    Worried About Running Out of Food in the Last Year: Never true    Ran Out of Food in the Last Year: Never true  Transportation Needs: No  Transportation Needs (04/17/2018)   PRAPARE - Administrator, Civil Service (Medical): No    Lack of Transportation (Non-Medical): No  Physical Activity: Inactive (04/17/2018)   Exercise Vital Sign    Days of Exercise per Week: 0 days    Minutes of Exercise per Session: 0 min  Stress: No Stress Concern Present (04/17/2018)   [employer] of Occupational Health - Occupational Stress Questionnaire    Feeling of Stress : Not at all  Social Connections: Moderately Integrated (04/17/2018)   Social Connection and Isolation Panel [NHANES]    Frequency of Communication with Friends and Family: More than three times a week    Frequency of Social Gatherings with Friends and Family: Once a week    Attends Religious Services: More than 4 times per year    Active Member of [employer] or Organizations: Yes    Attends Engineer, structural: More than 4 times per year    Marital Status: Never married   Family History  Problem Relation Age of Onset   Hypertension Other    Diabetes Other    Cancer Other    Hypertension Mother    Hyperlipidemia Mother    Hypertension Father    Allergies  Allergen Reactions   Codeine Itching   Peanut-Containing Drug Products Itching   Penicillins Hives    Has patient had a PCN reaction causing immediate rash, facial/tongue/throat swelling, SOB or lightheadedness with hypotension: No Has patient had a PCN reaction causing severe rash involving mucus membranes or skin necrosis: No Has patient had a PCN reaction that required hospitalization No Has patient had a PCN reaction occurring within the last 10 years: No If all of the above answers are "NO", then may proceed with Cephalosporin use.    Shellfish Allergy Itching   Current Outpatient Medications  Medication Sig Dispense Refill   traMADol (ULTRAM) 50 MG tablet Take 1 tablet (50 mg total) by mouth every 6 (six) hours as needed for up to 5 days for severe pain. 20 tablet 0   albuterol  (PROVENTIL HFA;VENTOLIN HFA) 108 (90 Base) MCG/ACT inhaler Inhale 2 puffs into the lungs every 4 (four) hours as needed for wheezing or shortness of breath (cough, shortness of breath or wheezing.). 1 Inhaler 1   amLODipine (NORVASC) 10 MG tablet Take 1 tablet (10 mg total) by mouth daily. 30 tablet 1   diclofenac (VOLTAREN) 75 MG EC tablet Take 1 tablet (75 mg total) by mouth 2 (two) times daily with a meal. 60 tablet 0   ferrous gluconate (  FERGON) 324 MG tablet Take 1 tablet (324 mg total) by mouth 2 (two) times daily with a meal. 60 tablet 2   ibuprofen (ADVIL) 800 MG tablet Take 1 tablet (800 mg total) by mouth every 8 (eight) hours as needed. 60 tablet 1   norethindrone (AYGESTIN) 5 MG tablet Take 3 tablets (15 mg total) by mouth daily. 180 tablet 5   oxyCODONE-acetaminophen (PERCOCET/ROXICET) 5-325 MG tablet Take 1 tablet by mouth every 6 (six) hours as needed for moderate pain. 10 tablet 0   polyethylene glycol powder (MIRALAX) powder Take 1 Container by mouth once.     predniSONE (STERAPRED UNI-PAK 21 TAB) 10 MG (21) TBPK tablet As directed (Patient not taking: Reported on 06/04/2019) 21 tablet 0   triamcinolone cream (KENALOG) 0.1 % Apply 1 application topically 2 (two) times daily. (Patient not taking: Reported on 06/04/2019) 30 g 0   Vitamin D, Ergocalciferol, (DRISDOL) 1.25 MG (50000 UT) CAPS capsule Take 1 capsule (50,000 Units total) by mouth every 7 (seven) days. 4 capsule 1   No current facility-administered medications for this visit.   No results found.  Review of Systems:   A ROS was performed including pertinent positives and negatives as documented in the HPI.  Physical Exam :   Constitutional: NAD and appears stated age Neurological: Alert and oriented Psych: Appropriate affect and cooperative There were no vitals taken for this visit.   Comprehensive Musculoskeletal Exam:    No significant tenderness to palpation over lumbar spinous processes or paralumbar muscles.   Good range of motion with lumbar flexion and lateral rotation without significant discomfort.  Passive flexion and internal rotation of the right hip causes discomfort in the right side of the low back.  Negative Faber.  Negative straight leg raise bilaterally.  Imaging:      Assessment:   42 y.o. female with acute right-sided sciatic symptoms with mild low back pain.  Patient does have history of low back issues and MRI in 2023 revealed mild degenerative disc disease.  Based on her presentation I do believe that this is a low back issue in addition to her current gluteus tendinopathy.  I believe physical therapy would be very beneficial for her at this time and she is already scheduled for initial evaluation in a few weeks for her hip.  Patient is not currently getting any relief with ibuprofen, Tylenol, and muscle relaxer so I will send in a tramadol prescription for additional pain control as opposed to oral steroid course as last A1c was 8.9.  Continue as planned for physical therapy and follow-up with Dr. Steward Drone unless there is any need for reevaluation sooner.  Plan :    -Begin tramadol for severe pain relief -Work note provided to return next Monday, 02/04/2023 -Begin physical therapy and return to clinic for follow-up as scheduled unless needed sooner      I personally saw and evaluated the patient, and participated in the management and treatment plan.  Hazle Nordmann, PA-C Orthopedics  This document was dictated using Conservation officer, historic buildings. A reasonable attempt at proof reading has been made to minimize errors.

## 2023-01-30 ENCOUNTER — Encounter (HOSPITAL_BASED_OUTPATIENT_CLINIC_OR_DEPARTMENT_OTHER): Payer: Self-pay

## 2023-02-20 ENCOUNTER — Ambulatory Visit (HOSPITAL_BASED_OUTPATIENT_CLINIC_OR_DEPARTMENT_OTHER): Payer: No Typology Code available for payment source | Attending: Orthopaedic Surgery | Admitting: Physical Therapy

## 2023-02-20 ENCOUNTER — Other Ambulatory Visit: Payer: Self-pay

## 2023-02-20 DIAGNOSIS — R2689 Other abnormalities of gait and mobility: Secondary | ICD-10-CM | POA: Diagnosis present

## 2023-02-20 DIAGNOSIS — M5459 Other low back pain: Secondary | ICD-10-CM | POA: Insufficient documentation

## 2023-02-20 DIAGNOSIS — M67959 Unspecified disorder of synovium and tendon, unspecified thigh: Secondary | ICD-10-CM | POA: Insufficient documentation

## 2023-02-20 DIAGNOSIS — M25551 Pain in right hip: Secondary | ICD-10-CM | POA: Diagnosis present
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
## 2023-02-21 ENCOUNTER — Encounter (HOSPITAL_BASED_OUTPATIENT_CLINIC_OR_DEPARTMENT_OTHER): Payer: Self-pay | Admitting: Physical Therapy

## 2023-02-25 ENCOUNTER — Ambulatory Visit (HOSPITAL_BASED_OUTPATIENT_CLINIC_OR_DEPARTMENT_OTHER): Payer: No Typology Code available for payment source | Admitting: Physical Therapy

## 2023-02-27 ENCOUNTER — Encounter (HOSPITAL_BASED_OUTPATIENT_CLINIC_OR_DEPARTMENT_OTHER): Payer: Self-pay | Admitting: Physical Therapy

## 2023-02-27 ENCOUNTER — Ambulatory Visit (HOSPITAL_BASED_OUTPATIENT_CLINIC_OR_DEPARTMENT_OTHER): Payer: No Typology Code available for payment source | Admitting: Physical Therapy

## 2023-02-27 DIAGNOSIS — R2689 Other abnormalities of gait and mobility: Secondary | ICD-10-CM

## 2023-02-27 DIAGNOSIS — M5459 Other low back pain: Secondary | ICD-10-CM

## 2023-02-27 DIAGNOSIS — M25551 Pain in right hip: Secondary | ICD-10-CM
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
## 2023-03-04 ENCOUNTER — Ambulatory Visit (HOSPITAL_BASED_OUTPATIENT_CLINIC_OR_DEPARTMENT_OTHER): Payer: No Typology Code available for payment source | Admitting: Family Medicine

## 2023-03-04 VITALS — BP 145/75 | HR 84 | Ht 64.0 in | Wt 334.3 lb

## 2023-03-04 DIAGNOSIS — Z600 Problems of adjustment to life-cycle transitions: Secondary | ICD-10-CM | POA: Diagnosis not present

## 2023-03-04 DIAGNOSIS — G932 Benign intracranial hypertension: Secondary | ICD-10-CM | POA: Diagnosis not present

## 2023-03-04 DIAGNOSIS — E119 Type 2 diabetes mellitus without complications: Secondary | ICD-10-CM | POA: Insufficient documentation

## 2023-03-04 DIAGNOSIS — I10 Essential (primary) hypertension: Secondary | ICD-10-CM | POA: Diagnosis not present

## 2023-03-04 DIAGNOSIS — E1165 Type 2 diabetes mellitus with hyperglycemia: Secondary | ICD-10-CM

## 2023-03-04 NOTE — Assessment & Plan Note (Signed)
PHQ-9 and GAD-7 with scores of 15 and 11, respectively.  Did discuss these screening findings in conjunction with her reported symptoms.  Discussed possibility of underlying depression/anxiety contributing to current symptoms.  For now, can continue with planned counseling/therapy.  She will be seeing a provider through work, but would prefer to have formal referral to alternative provider placed, referral placed today If symptoms do persist, could additionally consider pharmacotherapy Continue to monitor progress moving forward

## 2023-03-04 NOTE — Assessment & Plan Note (Signed)
Reportedly there was concern by prior PCP for underlying intracranial hypertension.  Patient is requesting referral to neurologist for further evaluation regarding this, referral placed today

## 2023-03-04 NOTE — Progress Notes (Signed)
New Patient Office Visit  Subjective    Patient ID: [place], female    DOB: February 16, 1981  Age: 42 y.o. MRN: [id number]  CC:  Chief Complaint  Patient presents with   Establish Care    Pt here to establish new care     HPI [place] presents to establish care Last PCP - through [facility], had [facility] providers previously but then had change of insurance.  DM: takes glipizide, has taken for about 1.5 years. Last A1c was 8.9% in February 2024. Did not do well with metformin in the past. Has been having some issues with blurry vision.  Patient does have an eye doctor that she follows with for regular eye exams  HTN: takes doxazosin, irbesartan-hydrochlorothiazide, nifedipine, hydrochlorothiazide. Sees cardiology with [facility], wants to switch to cardiologist with [facility]. Prescribed amlodipine in the past, had side effects.  Reports history of ICH - PCP recommended neurology evaluation, patient has not proceeded with this.  Patient would like referral to neurologist for further evaluation regarding this  Had sleep study, reportedly no diagnosis of sleep apnea.  Vit D deficiency: was normal on most recent test, through care everywhere this was March 2023.   Seeing orthopedic surgeon for MSK issues.  Reports having increased symptoms of worry, less interest in doing things, will be seeing therapist in the near future. Denies diagnosis of depression or mood issues in the past.  Patient is originally from [place], [place], has been here since 1998. 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  Outpatient Encounter Medications as of 03/04/2023  Medication Sig   albuterol (PROVENTIL HFA;VENTOLIN HFA) 108 (90 Base) MCG/ACT inhaler Inhale 2 puffs into the lungs every 4 (four) hours as needed for wheezing or shortness of breath (cough, shortness of breath or wheezing.).   cetirizine (ZYRTEC) 10 MG tablet Take by mouth.   Ciclopirox 1 % shampoo Apply topically.   clobetasol  ointment (TEMOVATE) 0.05 % APPLY TO SCALP DAILY ON MON-FRI AS NEEDED FOR FLAKING AND ITCHING. NEVER TO FACE.   doxazosin (CARDURA) 1 MG tablet Take 1 mg by mouth daily.   fluocinolone 0.01 % cream Apply twice a week.   Fluocinolone Acetonide Scalp 0.01 % OIL Apply twice a week.   fluticasone (FLONASE) 50 MCG/ACT nasal spray Administer 1 spray in each nostril 2 times daily.   glipiZIDE (GLUCOTROL XL) 5 MG 24 hr tablet Take 5 mg by mouth daily.   glucose blood (PRECISION QID TEST) test strip Check glucose twice daily as needed.   hydrochlorothiazide (MICROZIDE) 12.5 MG capsule Take 12.5 mg by mouth daily.   ipratropium-albuterol (DUONEB) 0.5-2.5 (3) MG/3ML SOLN Inhale into the lungs.   irbesartan-hydrochlorothiazide (AVALIDE) 300-12.5 MG tablet Take 1 tablet by mouth daily.   NIFEdipine (PROCARDIA XL/NIFEDICAL XL) 60 MG 24 hr tablet TAKE 2 TABLETS BY MOUTH ONCE DAILY FOR BLOOD PRESSURE   norethindrone (AYGESTIN) 5 MG tablet Take 3 tablets (15 mg total) by mouth daily.   norethindrone (AYGESTIN) 5 MG tablet Take by mouth.   tiZANidine (ZANAFLEX) 4 MG tablet TAKE 1 TABLET BY MOUTH NIGHTLY AS NEEDED   triamcinolone cream (KENALOG) 0.1 % Apply 1 application topically 2 (two) times daily.   zolpidem (AMBIEN) 5 MG tablet Take 5 mg by mouth at bedtime as needed for sleep.   [DISCONTINUED] amLODipine (NORVASC) 10 MG tablet Take 1 tablet (10 mg total) by mouth daily.   [DISCONTINUED] diclofenac (VOLTAREN) 75 MG EC tablet Take 1 tablet (75 mg total) by  mouth 2 (two) times daily with a meal.   [DISCONTINUED] ferrous gluconate (FERGON) 324 MG tablet Take 1 tablet (324 mg total) by mouth 2 (two) times daily with a meal.   [DISCONTINUED] ibuprofen (ADVIL) 800 MG tablet Take 1 tablet (800 mg total) by mouth every 8 (eight) hours as needed.   [DISCONTINUED] oxyCODONE-acetaminophen (PERCOCET/ROXICET) 5-325 MG tablet Take 1 tablet by mouth every 6 (six) hours as needed for moderate pain.   [DISCONTINUED]  polyethylene glycol powder (MIRALAX) powder Take 1 Container by mouth once.   [DISCONTINUED] predniSONE (STERAPRED UNI-PAK 21 TAB) 10 MG (21) TBPK tablet As directed (Patient not taking: Reported on 06/04/2019)   [DISCONTINUED] Vitamin D, Ergocalciferol, (DRISDOL) 1.25 MG (50000 UT) CAPS capsule Take 1 capsule (50,000 Units total) by mouth every 7 (seven) days.   No facility-administered encounter medications on file as of 03/04/2023.    Past Medical History:  Diagnosis Date   Anemia    Asthma    only uses inhaler in fall and spring   Constipation    GERD (gastroesophageal reflux disease)    diet controlled, occ. uses omeprazole   Headache    otc med prn   History of kidney stones    Hx of Bell's palsy June 2013   Hypertension    currently no meds   Menometrorrhagia    Prediabetes    Psoriasis    Shortness of breath dyspnea    with exercise/exertion   Vitamin D deficiency     Past Surgical History:  Procedure Laterality Date   COMBINED HYSTEROSCOPY DIAGNOSTIC / D&C N/A 2011   Benign secretory endometrium   CYSTOSCOPY WITH STENT PLACEMENT Left 10/19/2014   Procedure: CYSTOSCOPY WITH STENT PLACEMENT;  Surgeon: Crist Fat, MD;  Location: [facility];  Service: Urology;  Laterality: Left;   DILATION AND CURETTAGE OF UTERUS  10/2003   Polyp, mild atypia, simple and complex hyperplasia,   DILATION AND CURETTAGE OF UTERUS  11/2003   Simple and complex hyperplasia   DILATION AND CURETTAGE OF UTERUS  2011   DILATION AND CURETTAGE OF UTERUS N/A 12/14/2014   Procedure: DILATATION AND CURETTAGE;  Surgeon: Catalina Antigua, MD;  Location: [facility];  Service: Gynecology;  Laterality: N/A;   HOLMIUM LASER APPLICATION Left 10/19/2014   Procedure: HOLMIUM LASER APPLICATION;  Surgeon: Crist Fat, MD;  Location: [facility];  Service: Urology;  Laterality: Left;   HYSTEROSCOPY WITH D & C N/A 07/21/2018   Procedure: DILATATION AND CURETTAGE /HYSTEROSCOPY;  Surgeon: Adam Phenix, MD;  Location:  [facility];  Service: Gynecology;  Laterality: N/A;   INTRAUTERINE DEVICE (IUD) INSERTION N/A 12/14/2014   Procedure: INTRAUTERINE DEVICE (IUD) INSERTION;  Surgeon: Catalina Antigua, MD;  [facility]: [facility];  Service: Gynecology;  Laterality: N/A;   STONE EXTRACTION WITH BASKET Left 10/19/2014   Procedure: STONE EXTRACTION WITH BASKET;  Surgeon: Crist Fat, MD;  Location: [facility];  Service: Urology;  Laterality: Left;   WISDOM TOOTH EXTRACTION      Family History  Problem Relation Age of Onset   Hypertension Other    Diabetes Other    Cancer Other    Hypertension Mother    Hyperlipidemia Mother    Hypertension Father     Social History   Socioeconomic History   Marital status: Single    Spouse name: Not on file   Number of children: Not on file   Years of education: Not on file   Highest education level: Not on file  Occupational  History   Not on file  Tobacco Use   Smoking status: Never   Smokeless tobacco: Never  Vaping Use   Vaping status: Never Used  Substance and Sexual Activity   Alcohol use: No   Drug use: No   Sexual activity: Not Currently    Birth control/protection: None  Other Topics Concern   Not on file  Social History Narrative   She is from [place], [place], moved here 1998.    Lives at home by herself.    Social Determinants of Health   Financial Resource Strain: Medium Risk (10/18/2021)   [facility] prior to 09/29/2022., [facility] visits prior to 09/29/2022.   Overall Financial Resource Strain (CARDIA)    Difficulty of Paying Living Expenses: Somewhat hard  Food Insecurity: Low Risk  (09/19/2022)   [facility] prior to 09/29/2022., [facility] visits prior to 09/29/2022.   Food    Within the past 12 months, you worried that your food would run out before you got money to buy more food: Never true     Within the past 12 months, the food you bought just didn't last and you didn't have money to get more: Never true  Transportation Needs: No Transportation Needs (09/19/2022)   [facility] prior to 09/29/2022., [facility] visits prior to 09/29/2022.   Transportation    In the past 12 months, has lack of reliable transportation kept you from medical appointments, meetings, work or from getting things needed for daily living?: No  Physical Activity: Insufficiently Active (10/18/2021)   [facility] visits prior to 09/29/2022., [facility] [place] [facility] visits prior to 09/29/2022.   Exercise Vital Sign    Days of Exercise per Week: 3 days    Minutes of Exercise per Session: 30 min  Stress: No Stress Concern Present (10/18/2021)   Received [facility] visits prior to 09/29/2022., [facility] visits prior to 09/29/2022.   [employer] of Occupational Health - Occupational Stress Questionnaire    Feeling of Stress : Not at all  Social Connections: Moderately Integrated (10/18/2021)   [facility] visits prior to 09/29/2022., [facility] visits prior to 09/29/2022.   Social Advertising account executive [NHANES]    Frequency of Communication with Friends and Family: More than three times a week    Frequency of Social Gatherings with Friends and Family: Once a week    Attends Religious Services: More than 4 times per year    Active Member of [employer] or Organizations: Yes    Attends Banker Meetings: More than 4 times per year    Marital Status: Never married  Intimate Partner Violence: Not At Risk (10/18/2021)   [facility] prior to 09/29/2022., [facility] visits prior to 09/29/2022.   Humiliation, Afraid, Rape, and Kick questionnaire    Fear of Current or Ex-Partner: No    Emotionally Abused: No    Physically Abused: No    Sexually Abused: No    Objective    BP (!) 145/75 (BP Location: Right Arm, Patient Position: Sitting, Cuff Size: Large)  Pulse 84   Ht 5\' 4"  (1.626 m)   Wt (!) 334 lb 4.8 oz (151.6 kg)   SpO2 98%   BMI 57.38 kg/m   Physical Exam  42 year old female in no acute distress Cardiovascular exam with regular rate and rhythm, systolic murmur appreciated Lungs clear to auscultation bilaterally  Assessment & Plan:   Intracranial hypertension Assessment & Plan: Reportedly there was concern by prior PCP for underlying intracranial hypertension.  Patient is requesting referral to neurologist for further evaluation regarding this, referral placed today  Orders: -     Ambulatory referral to Neurology  Primary hypertension Assessment & Plan: Blood pressure borderline in office today, systolic not at goal, diastolic at goal.  Can continue with current regimen, referral placed to cardiology for patient to establish with, provider. Recommend intermittent monitoring of blood pressure at home, DASH diet  Orders: -     Ambulatory referral to Cardiology  Type 2 diabetes mellitus with hyperglycemia, without long-term current use of insulin [facility]) Assessment & Plan: Most recent hemoglobin A1c in February 2024, not at goal at that time at 8.9%.  Discussed that for patient, goal would be to obtain A1c less than 7.0%.  Discussed that at that level, this does help to reduce risk for potential side effects related to diabetes. For now, can continue with glipizide We will check hemoglobin A1c today.  Did discuss that if this continues to be above goal, would recommend initiating additional medication.  We discussed GLP-1 receptor agonist, oral and injectable options, SGLT2 inhibitor.  She does have some hesitancy related  to possible injectable medication.  Thus, if A1c not at target, likely would proceed with SGLT 2 inhibitor We will also proceed with urine microalbumin/creatinine ratio today, has been about 1 year since last checked Plan to complete foot exam at next office visit Discussed having patient's eye doctor send reports to our office for documentation  Orders: -     Hemoglobin A1c -     Microalbumin / creatinine urine ratio  Phase of life problem Assessment & Plan: PHQ-9 and GAD-7 with scores of 15 and 11, respectively.  Did discuss these screening findings in conjunction with her reported symptoms.  Discussed possibility of underlying depression/anxiety contributing to current symptoms.  For now, can continue with planned counseling/therapy.  She will be seeing a provider through work, but would prefer to have formal referral to alternative provider placed, referral placed today If symptoms do persist, could additionally consider pharmacotherapy Continue to monitor progress moving forward  Orders: -     Ambulatory referral to Psychology  Return in about 4 weeks (around 04/01/2023) for diabetes, hypertension.   Spent 62 minutes on this patient encounter, including preparation, chart review, face-to-face counseling with patient and coordination of care, and documentation of encounter   ___________________________________________ Zareena [place], MD, ABFM, [facility] Primary Care and Sports Medicine [facility]

## 2023-03-04 NOTE — Assessment & Plan Note (Signed)
Most recent hemoglobin A1c in February 2024, not at goal at that time at 8.9%.  Discussed that for patient, goal would be to obtain A1c less than 7.0%.  Discussed that at that level, this does help to reduce risk for potential side effects related to diabetes. For now, can continue with glipizide We will check hemoglobin A1c today.  Did discuss that if this continues to be above goal, would recommend initiating additional medication.  We discussed GLP-1 receptor agonist, oral and injectable options, SGLT2 inhibitor.  She does have some hesitancy related to possible injectable medication.  Thus, if A1c not at target, likely would proceed with SGLT 2 inhibitor We will also proceed with urine microalbumin/creatinine ratio today, has been about 1 year since last checked Plan to complete foot exam at next office visit Discussed having patient's eye doctor send reports to our office for documentation

## 2023-03-04 NOTE — Assessment & Plan Note (Signed)
Blood pressure borderline in office today, systolic not at goal, diastolic at goal.  Can continue with current regimen, referral placed to cardiology for patient to establish with, provider. Recommend intermittent monitoring of blood pressure at home, DASH diet

## 2023-03-07 ENCOUNTER — Encounter (HOSPITAL_BASED_OUTPATIENT_CLINIC_OR_DEPARTMENT_OTHER): Payer: Self-pay | Admitting: Physical Therapy

## 2023-03-07 ENCOUNTER — Other Ambulatory Visit (HOSPITAL_BASED_OUTPATIENT_CLINIC_OR_DEPARTMENT_OTHER): Payer: Self-pay | Admitting: Family Medicine

## 2023-03-07 ENCOUNTER — Ambulatory Visit (HOSPITAL_BASED_OUTPATIENT_CLINIC_OR_DEPARTMENT_OTHER): Payer: No Typology Code available for payment source | Attending: Orthopaedic Surgery | Admitting: Physical Therapy

## 2023-03-07 DIAGNOSIS — M5459 Other low back pain: Secondary | ICD-10-CM | POA: Diagnosis not present

## 2023-03-07 DIAGNOSIS — M25551 Pain in right hip: Secondary | ICD-10-CM | POA: Insufficient documentation

## 2023-03-07 DIAGNOSIS — E1165 Type 2 diabetes mellitus with hyperglycemia: Secondary | ICD-10-CM

## 2023-03-07 DIAGNOSIS — R2689 Other abnormalities of gait and mobility: Secondary | ICD-10-CM | POA: Diagnosis not present

## 2023-03-07 MED ORDER — EMPAGLIFLOZIN 10 MG PO TABS: 10.0000 mg | ORAL_TABLET | Freq: Every day | ORAL | 2 refills | Status: DC
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
## 2023-03-08 DIAGNOSIS — R2689 Other abnormalities of gait and mobility: Secondary | ICD-10-CM | POA: Diagnosis present

## 2023-03-08 DIAGNOSIS — M25551 Pain in right hip: Secondary | ICD-10-CM | POA: Diagnosis present

## 2023-03-08 DIAGNOSIS — M5459 Other low back pain: Secondary | ICD-10-CM | POA: Diagnosis present

## 2023-03-11 ENCOUNTER — Telehealth: Payer: Self-pay | Admitting: Orthopaedic Surgery

## 2023-03-11 NOTE — Telephone Encounter (Signed)
Patient called asked she is in so much pain she could hardly drive home. Patient said she had to stop a couple of times just to rest your leg. Patient said she is hurting from her right hip down her leg. The number to contact patient  is [phone]

## 2023-03-12 ENCOUNTER — Ambulatory Visit (HOSPITAL_BASED_OUTPATIENT_CLINIC_OR_DEPARTMENT_OTHER): Payer: No Typology Code available for payment source | Admitting: Student

## 2023-03-12 ENCOUNTER — Other Ambulatory Visit (HOSPITAL_BASED_OUTPATIENT_CLINIC_OR_DEPARTMENT_OTHER): Payer: Self-pay

## 2023-03-12 ENCOUNTER — Encounter (HOSPITAL_BASED_OUTPATIENT_CLINIC_OR_DEPARTMENT_OTHER): Payer: Self-pay | Admitting: Student

## 2023-03-12 ENCOUNTER — Encounter (HOSPITAL_BASED_OUTPATIENT_CLINIC_OR_DEPARTMENT_OTHER): Payer: Self-pay | Admitting: Family Medicine

## 2023-03-12 DIAGNOSIS — M5441 Lumbago with sciatica, right side: Secondary | ICD-10-CM | POA: Diagnosis not present

## 2023-03-12 DIAGNOSIS — M67959 Unspecified disorder of synovium and tendon, unspecified thigh: Secondary | ICD-10-CM | POA: Diagnosis not present

## 2023-03-12 NOTE — Progress Notes (Signed)
Chief Complaint: Low back and right leg pain     History of Present Illness:   03/12/23: Molly Archer presents today for follow-up of her radiating right-sided low back pain.  She states that she is so far had no improvement in her symptoms, and her pain levels are actually somewhat worse.  She has completed a few sessions of physical therapy, mainly aquatic, and that she has liked this however the following day is usually more painful.  She did take some of the tramadol prescribed after last visit which helped her back pain but not her radicular symptoms.  She then developed an allergic reaction from the tramadol so had to stop taking.  Today she reports that her right leg all the way down to her toes has severe throbbing pain and some numbness/tingling.  Her right leg feels shaky and like it might give out.  She is having specific difficulty while driving and sitting down for long periods of time.  She is scheduled to drive to [place], [place] this weekend.  Has been taking ibuprofen and Tylenol as well as a tizanidine 4 mg before bed.   01/29/23: Molly Archer is a 42 y.o. female presenting today with chief complaint of radiating pain down the back of her right leg.  This began 3 days ago without any known injury or overuse.  She did get a right greater trochanter injection which she did get significant relief from until pain returned 3 days ago.  States that her low back has a nagging pain but the pain going down the right leg is 10 out of 10.  Often feels like her right leg wants to give out.  Denies any numbness or tingling.  She is alternating ibuprofen and Tylenol as well as taking tizanidine at night.  She is scheduled to start physical therapy on 02/20/2023.  Denies any bowel or bladder dysfunction.   Surgical History:   None  PMH/PSH/Family History/Social History/Meds/Allergies:    Past Medical History:  Diagnosis Date   Anemia    Asthma    only uses  inhaler in fall and spring   Constipation    GERD (gastroesophageal reflux disease)    diet controlled, occ. uses omeprazole   Headache    otc med prn   History of kidney stones    Hx of Bell's palsy June 2013   Hypertension    currently no meds   Menometrorrhagia    Prediabetes    Psoriasis    Shortness of breath dyspnea    with exercise/exertion   Vitamin D deficiency    Past Surgical History:  Procedure Laterality Date   COMBINED HYSTEROSCOPY DIAGNOSTIC / D&C N/A 2011   Benign secretory endometrium   CYSTOSCOPY WITH STENT PLACEMENT Left 10/19/2014   Procedure: CYSTOSCOPY WITH STENT PLACEMENT;  Surgeon: Crist Fat, MD;  [facility];  Service: Urology;  Laterality: Left;   DILATION AND CURETTAGE OF UTERUS  10/2003   Polyp, mild atypia, simple and complex hyperplasia,   DILATION AND CURETTAGE OF UTERUS  11/2003   Simple and complex hyperplasia   DILATION AND CURETTAGE OF UTERUS  2011   DILATION AND CURETTAGE OF UTERUS N/A 12/14/2014   Procedure: DILATATION AND CURETTAGE;  Surgeon: Catalina Antigua, MD;  [facility];  Service: Gynecology;  Laterality: N/A;  HOLMIUM LASER APPLICATION Left 10/19/2014   Procedure: HOLMIUM LASER APPLICATION;  Surgeon: Crist Fat, MD;  [facility];  Service: Urology;  Laterality: Left;   HYSTEROSCOPY WITH D & C N/A 07/21/2018   Procedure: DILATATION AND CURETTAGE /HYSTEROSCOPY;  Surgeon: Adam Phenix, MD;  [facility];  Service: Gynecology;  Laterality: N/A;   INTRAUTERINE DEVICE (IUD) INSERTION N/A 12/14/2014   Procedure: INTRAUTERINE DEVICE (IUD) INSERTION;  Surgeon: Catalina Antigua, MD;  [facility];  Service: Gynecology;  Laterality: N/A;   STONE EXTRACTION WITH BASKET Left 10/19/2014   Procedure: STONE EXTRACTION WITH BASKET;  Surgeon: Crist Fat, MD;  [facility];  Service: Urology;  Laterality: Left;   WISDOM TOOTH EXTRACTION     Social History   Socioeconomic History   Marital status: Single     Spouse name: Not on file   Number of children: Not on file   Years of education: Not on file   Highest education level: Not on file  Occupational History   Not on file  Tobacco Use   Smoking status: Never   Smokeless tobacco: Never  Vaping Use   Vaping status: Never Used  Substance and Sexual Activity   Alcohol use: No   Drug use: No   Sexual activity: Not Currently    Birth control/protection: None  Other Topics Concern   Not on file  Social History Narrative   She is from [place], [place], moved here 1998.    Lives at home by herself.    Social Determinants of Health   Financial Resource Strain: Medium Risk (10/18/2021)   [facility] visits prior to 09/29/2022.   Overall Financial Resource Strain (CARDIA)    Difficulty of Paying Living Expenses: Somewhat hard  Food Insecurity: Low Risk  (09/19/2022)   [facility] prior to 09/29/2022., [facility] visits prior to 09/29/2022.   Food    Within the past 12 months, you worried that your food would run out before you got money to buy more food: Never true    Within the past 12 months, the food you bought just didn't last and you didn't have money to get more: Never true  Transportation Needs: No Transportation Needs (09/19/2022)   [facility] from [facility] visits prior to 09/29/2022., [facility] visits prior to 09/29/2022.   Transportation    In the past 12 months, has lack of reliable transportation kept you from medical appointments, meetings, work or from getting things needed for daily living?: No  Physical Activity: Insufficiently Active (10/18/2021)   Received from [facility] visits prior to 09/29/2022., [facility] visits prior to  09/29/2022.   Exercise Vital Sign    Days of Exercise per Week: 3 days    Minutes of Exercise per Session: 30 min  Stress: No Stress Concern Present (10/18/2021)   [facility] prior to 09/29/2022., [facility] visits prior to 09/29/2022.   [employer] of Occupational Health - Occupational Stress Questionnaire    Feeling of Stress : Not at all  Social Connections: Moderately Integrated (10/18/2021)   [facility] prior to 09/29/2022., [facility] visits prior to 09/29/2022.   Social Advertising account executive [NHANES]    Frequency of Communication with Friends and Family: More than three times a week    Frequency of Social Gatherings with Friends and Family: Once a week    Attends Religious Services: More than 4 times per year    Active Member of [employer] or Organizations: Yes    Attends Engineer, structural: More than 4 times per year    Marital Status: Never married   Family History  Problem Relation Age of Onset   Hypertension Other    Diabetes Other    Cancer Other    Hypertension Mother    Hyperlipidemia Mother    Hypertension Father    Allergies  Allergen Reactions   Codeine Itching   Peanut-Containing Drug Products Itching   Penicillins Hives    Has patient had a PCN reaction causing immediate rash, facial/tongue/throat swelling, SOB or lightheadedness with hypotension: No Has patient had a PCN reaction causing severe rash involving mucus membranes or skin necrosis: No Has patient had a PCN reaction that required hospitalization No Has patient had a PCN reaction occurring within the last 10 years: No If all of the above answers are "NO", then may proceed with Cephalosporin use.    Shellfish Allergy Itching   Tramadol Hives   Current Outpatient Medications  Medication Sig Dispense  Refill   albuterol (PROVENTIL HFA;VENTOLIN HFA) 108 (90 Base) MCG/ACT inhaler Inhale 2 puffs into the lungs every 4 (four) hours as needed for wheezing or shortness of breath (cough, shortness of breath or wheezing.). 1 Inhaler 1   cetirizine (ZYRTEC) 10 MG tablet Take by mouth.     Ciclopirox 1 % shampoo Apply topically.     clobetasol ointment (TEMOVATE) 0.05 % APPLY TO SCALP DAILY ON MON-FRI AS NEEDED FOR FLAKING AND ITCHING. NEVER TO FACE.     doxazosin (CARDURA) 1 MG tablet Take 1 mg by mouth daily.     empagliflozin (JARDIANCE) 10 MG TABS tablet Take 1 tablet (10 mg total) by mouth daily before breakfast. 30 tablet 2   fluocinolone 0.01 % cream Apply twice a week.     Fluocinolone Acetonide Scalp 0.01 % OIL Apply twice a week.     fluticasone (FLONASE) 50 MCG/ACT nasal spray Administer 1 spray in each nostril 2 times daily.     glipiZIDE (GLUCOTROL XL) 5 MG 24 hr tablet Take 5 mg by mouth daily.     glucose blood (PRECISION QID TEST) test strip Check glucose twice daily as needed.     hydrochlorothiazide (MICROZIDE) 12.5 MG capsule Take 12.5 mg by mouth daily.     ipratropium-albuterol (DUONEB) 0.5-2.5 (3) MG/3ML SOLN Inhale into the lungs.     irbesartan-hydrochlorothiazide (AVALIDE) 300-12.5 MG tablet Take 1 tablet by mouth daily.     NIFEdipine (PROCARDIA XL/NIFEDICAL XL) 60 MG 24 hr tablet TAKE 2 TABLETS BY MOUTH ONCE DAILY FOR BLOOD PRESSURE     norethindrone (AYGESTIN) 5 MG tablet Take 3 tablets (15 mg total) by mouth daily. 180 tablet 5   norethindrone (AYGESTIN) 5 MG tablet Take by mouth.     tiZANidine (ZANAFLEX) 4 MG tablet TAKE 1 TABLET BY MOUTH NIGHTLY AS NEEDED     triamcinolone cream (KENALOG) 0.1 % Apply 1 application topically 2 (two) times daily. 30 g 0   zolpidem (AMBIEN) 5 MG tablet Take 5 mg by mouth at bedtime as needed for sleep.     No current  facility-administered medications for this visit.   No results found.  Review of Systems:   A ROS was performed  including pertinent positives and negatives as documented in the HPI.  Physical Exam :   Constitutional: NAD and appears stated age Neurological: Alert and oriented Psych: Appropriate affect and cooperative There were no vitals taken for this visit.   Comprehensive Musculoskeletal Exam:    Mild tenderness palpation in the right lower lumbar area.  Lumbar spinous processes and SI joints are nontender.  Lumbar range of motion limited due to pain.  Right knee extension strength 5/5 and flexion 4/5.  Plantarflexion and dorsiflexion strength 5/5.  Negative ankle clonus.  Imaging:      Assessment:   42 y.o. female with acute right-sided low back pain and sciatica.  So far she has not done any significant improvement in her symptoms with medications and physical therapy.  I would like her to continue with PT while we continue working this up as I believe aquatics will be very beneficial.  At this point I would like her to be seen by Dr. Naaman Plummer or Ellin Goodie for further evaluation as well as to determine if she would be a candidate for epidural steroid injections.  Her last A1c did drop significantly from 8.9 to 7.7 so she might be a stronger candidate for steroids.  I did recommend that she can switch current ibuprofen use to meloxicam in conjunction with the Tylenol to avoid taking too many NSAIDs throughout the day.  She does have an aquatic therapy appointment scheduled tomorrow evening which I advised her to keep and discuss further tomorrow.  Can return to our office as needed for reevaluation.  Plan :    - Referral over to Ellin Goodie, NP for further evaluation tomorrow and possible ESI discussion      I personally saw and evaluated the patient, and participated in the management and treatment plan.  Hazle Nordmann, PA-C Orthopedics  This document was dictated using Conservation officer, historic buildings. A reasonable attempt at proof reading has been made to minimize  errors.

## 2023-03-12 NOTE — Telephone Encounter (Signed)
Called patient and schdeuled her to see Jean Rosenthal today at 3:30

## 2023-03-13 ENCOUNTER — Encounter: Payer: Self-pay | Admitting: Physical Medicine and Rehabilitation

## 2023-03-13 ENCOUNTER — Ambulatory Visit (HOSPITAL_BASED_OUTPATIENT_CLINIC_OR_DEPARTMENT_OTHER): Payer: No Typology Code available for payment source | Admitting: Physical Therapy

## 2023-03-13 ENCOUNTER — Encounter (HOSPITAL_BASED_OUTPATIENT_CLINIC_OR_DEPARTMENT_OTHER): Payer: Self-pay | Admitting: Physical Therapy

## 2023-03-13 ENCOUNTER — Ambulatory Visit: Payer: No Typology Code available for payment source | Admitting: Physical Medicine and Rehabilitation

## 2023-03-13 DIAGNOSIS — M5459 Other low back pain: Secondary | ICD-10-CM

## 2023-03-13 DIAGNOSIS — M5416 Radiculopathy, lumbar region: Secondary | ICD-10-CM | POA: Diagnosis not present

## 2023-03-13 DIAGNOSIS — M47816 Spondylosis without myelopathy or radiculopathy, lumbar region: Secondary | ICD-10-CM | POA: Diagnosis not present

## 2023-03-13 DIAGNOSIS — M25551 Pain in right hip: Secondary | ICD-10-CM

## 2023-03-13 DIAGNOSIS — M5116 Intervertebral disc disorders with radiculopathy, lumbar region: Secondary | ICD-10-CM

## 2023-03-13 DIAGNOSIS — R2689 Other abnormalities of gait and mobility: Secondary | ICD-10-CM

## 2023-03-13 DIAGNOSIS — Z6841 Body Mass Index (BMI) 40.0 and over, adult: Secondary | ICD-10-CM | POA: Diagnosis not present
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
## 2023-03-13 NOTE — Progress Notes (Signed)
Functional Pain Scale - descriptive words and definitions  Distressing (6)    Pain is present/unable to complete most ADLs limited by pain/sleep is difficult and active distraction is only marginal. Moderate range order  Average Pain  varies but can get to 10  Lower back pain on right side that radiates in the right leg to the foot

## 2023-03-15 ENCOUNTER — Encounter: Payer: Self-pay | Admitting: Neurology

## 2023-03-18 ENCOUNTER — Encounter (HOSPITAL_BASED_OUTPATIENT_CLINIC_OR_DEPARTMENT_OTHER): Payer: Self-pay | Admitting: Physical Therapy

## 2023-03-19 ENCOUNTER — Ambulatory Visit (HOSPITAL_BASED_OUTPATIENT_CLINIC_OR_DEPARTMENT_OTHER): Payer: No Typology Code available for payment source | Admitting: Physical Therapy

## 2023-03-19 ENCOUNTER — Encounter (HOSPITAL_BASED_OUTPATIENT_CLINIC_OR_DEPARTMENT_OTHER): Payer: Self-pay

## 2023-03-21 ENCOUNTER — Ambulatory Visit (HOSPITAL_BASED_OUTPATIENT_CLINIC_OR_DEPARTMENT_OTHER): Payer: No Typology Code available for payment source | Admitting: Physical Therapy

## 2023-03-21 ENCOUNTER — Encounter (HOSPITAL_BASED_OUTPATIENT_CLINIC_OR_DEPARTMENT_OTHER): Payer: Self-pay | Admitting: Physical Therapy

## 2023-03-21 DIAGNOSIS — M5459 Other low back pain: Secondary | ICD-10-CM

## 2023-03-21 DIAGNOSIS — R2689 Other abnormalities of gait and mobility: Secondary | ICD-10-CM

## 2023-03-21 DIAGNOSIS — M25551 Pain in right hip: Secondary | ICD-10-CM | POA: Diagnosis not present
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
## 2023-03-22 ENCOUNTER — Other Ambulatory Visit (HOSPITAL_BASED_OUTPATIENT_CLINIC_OR_DEPARTMENT_OTHER): Payer: Self-pay

## 2023-03-24 ENCOUNTER — Encounter (HOSPITAL_BASED_OUTPATIENT_CLINIC_OR_DEPARTMENT_OTHER): Payer: Self-pay | Admitting: Orthopaedic Surgery

## 2023-03-25 ENCOUNTER — Other Ambulatory Visit: Payer: Self-pay

## 2023-03-25 ENCOUNTER — Encounter (HOSPITAL_BASED_OUTPATIENT_CLINIC_OR_DEPARTMENT_OTHER): Payer: Self-pay | Admitting: Emergency Medicine

## 2023-03-25 ENCOUNTER — Other Ambulatory Visit (HOSPITAL_BASED_OUTPATIENT_CLINIC_OR_DEPARTMENT_OTHER): Payer: Self-pay

## 2023-03-25 ENCOUNTER — Telehealth: Payer: Self-pay

## 2023-03-25 ENCOUNTER — Telehealth: Payer: Self-pay | Admitting: Orthopedic Surgery

## 2023-03-25 ENCOUNTER — Emergency Department (HOSPITAL_BASED_OUTPATIENT_CLINIC_OR_DEPARTMENT_OTHER)
Admission: EM | Admit: 2023-03-25 | Discharge: 2023-03-25 | Disposition: A | Discharge status: Home / Self Care | Payer: No Typology Code available for payment source | Attending: Emergency Medicine | Admitting: Emergency Medicine

## 2023-03-25 DIAGNOSIS — I10 Essential (primary) hypertension: Secondary | ICD-10-CM | POA: Insufficient documentation

## 2023-03-25 DIAGNOSIS — E119 Type 2 diabetes mellitus without complications: Secondary | ICD-10-CM | POA: Insufficient documentation

## 2023-03-25 DIAGNOSIS — Z7984 Long term (current) use of oral hypoglycemic drugs: Secondary | ICD-10-CM | POA: Diagnosis not present

## 2023-03-25 DIAGNOSIS — M5431 Sciatica, right side: Secondary | ICD-10-CM | POA: Insufficient documentation

## 2023-03-25 DIAGNOSIS — Z79899 Other long term (current) drug therapy: Secondary | ICD-10-CM | POA: Diagnosis not present

## 2023-03-25 DIAGNOSIS — Z91010 Allergy to peanuts: Secondary | ICD-10-CM | POA: Insufficient documentation

## 2023-03-25 DIAGNOSIS — M25551 Pain in right hip: Secondary | ICD-10-CM | POA: Diagnosis present

## 2023-03-25 MED ORDER — PREDNISONE 10 MG PO TABS: 20.0000 mg | ORAL_TABLET | Freq: Every day | ORAL | 0 refills | Status: DC

## 2023-03-25 MED ORDER — KETOROLAC TROMETHAMINE 15 MG/ML IJ SOLN
15.0000 mg | Freq: Once | INTRAMUSCULAR | Status: AC
  Administered 2023-03-25: 15 mg via INTRAMUSCULAR
  Filled 2023-03-25: qty 1

## 2023-03-25 MED ORDER — PREDNISONE 10 MG PO TABS
20.0000 mg | ORAL_TABLET | Freq: Every day | ORAL | 0 refills | Status: AC
  Filled 2023-03-25 (×2): qty 8, 4d supply, fill #0

## 2023-03-25 NOTE — Telephone Encounter (Signed)
------------------------------------------------------------ For   On call                                         Which Office Are You Phoning?                        ORTHOPEDICS                                          Caller                                               Molly Archer                                      Is This An Emergency?                                Yes                                                                                                                                                           Area Code & Ph#    [phone]                       Patient                                              Same                                                 DOB   July 21, 1981                                       Patient's Provider                                   Huel Cote  MD  -  Orthopedics                   Re:                                                  In severe pain from back going to her legs. Can      barely walk. Should she go to the ER?                Rx Issue?                                            No                                                  Ph# Confirmed    [phone]                                                                                                                                                                                       PT   1: 26-AUG-24 11:40 AM SLB R15                 [phone]                    [facility]IAGE LINE- [facility]                       PT   2: 26-AUG-24 11:59 AM NGC R15                 [id number]                    [facility]                       PT   3: 26-AUG-24 12:17 PM NGC R15                 [id number]                    [facility]                      PT  4: 26-AUG-24 12:46 PM NGC #05                                                ASKING SUPERVISORS                      PT   5: 26-AUG-24 12:51 PM NGC *05                 [id number]                      [name]-SEND EMAIL

## 2023-03-25 NOTE — Telephone Encounter (Signed)
FYI Followed up on patient, ED gave her a toradol inj and prednisone

## 2023-03-25 NOTE — ED Provider Notes (Signed)
[place] EMERGENCY DEPARTMENT AT [facility] Provider Note   CSN: [id number] Arrival date & time: 03/25/23  1422     History  Chief Complaint  Patient presents with   Hip Pain    Molly Archer is a 42 y.o. female.   Hip Pain  42 year old female history of GERD, hypertension, type 2 diabetes, lumbar radiculopathy presenting for right hip and leg pain.  Patient states she has a long history of several years of right-sided back pain which radiates to her right leg and knee.  Has been worse for the last few months.  She is follows with orthopedics and last saw them August 14, she tried many medications is currently scheduled for a injection of steroids in her spine.  Last few days she has had slightly worsened pain so presents for evaluation.  She tried to call her doctor's office today who told her to come here.  She has longstanding numbness and tingling to the toes in her right foot, no symptoms on the left.  No saddle anesthesia, urinary retention, or bowel or bladder incontinence.  No fevers or chills.  Pain is the same pain she has had before from her radiculopathy.  Denies chance of pregnancy.     Home Medications Prior to Admission medications   Medication Sig Start Date End Date Taking? Authorizing Provider  albuterol (PROVENTIL HFA;VENTOLIN HFA) 108 (90 Base) MCG/ACT inhaler Inhale 2 puffs into the lungs every 4 (four) hours as needed for wheezing or shortness of breath (cough, shortness of breath or wheezing.). 08/07/17   Garnetta Buddy, PA  cetirizine (ZYRTEC) 10 MG tablet Take by mouth. 09/19/22   [provider]  Ciclopirox 1 % shampoo Apply topically. 03/12/22   [provider]  clobetasol ointment (TEMOVATE) 0.05 % APPLY TO SCALP DAILY ON MON-FRI AS NEEDED FOR FLAKING AND ITCHING. NEVER TO FACE.    [provider]  doxazosin (CARDURA) 1 MG tablet Take 1 mg by mouth daily.    [provider]  empagliflozin (JARDIANCE) 10  MG TABS tablet Take 1 tablet (10 mg total) by mouth daily before breakfast. 03/07/23   de [place], Buren Kos, MD  fluocinolone 0.01 % cream Apply twice a week. 06/19/19   [provider]  Fluocinolone Acetonide Scalp 0.01 % OIL Apply twice a week. 10/02/19   [provider]  fluticasone (FLONASE) 50 MCG/ACT nasal spray Administer 1 spray in each nostril 2 times daily. 09/19/22   [provider]  glipiZIDE (GLUCOTROL XL) 5 MG 24 hr tablet Take 5 mg by mouth daily.    [provider]  glucose blood (PRECISION QID TEST) test strip Check glucose twice daily as needed. 09/19/22   [provider]  hydrochlorothiazide (MICROZIDE) 12.5 MG capsule Take 12.5 mg by mouth daily.    [provider]  ipratropium-albuterol (DUONEB) 0.5-2.5 (3) MG/3ML SOLN Inhale into the lungs. 07/19/22   [provider]  irbesartan-hydrochlorothiazide (AVALIDE) 300-12.5 MG tablet Take 1 tablet by mouth daily.    [provider]  NIFEdipine (PROCARDIA XL/NIFEDICAL XL) 60 MG 24 hr tablet TAKE 2 TABLETS BY MOUTH ONCE DAILY FOR BLOOD PRESSURE    [provider]  norethindrone (AYGESTIN) 5 MG tablet Take 3 tablets (15 mg total) by mouth daily. 10/22/18   Tereso Newcomer, MD  norethindrone (AYGESTIN) 5 MG tablet Take by mouth. 09/19/22 12/09/23  [provider]  predniSONE (DELTASONE) 10 MG tablet Take 2 tablets (20 mg total) by mouth daily for  4 days. 03/25/23 03/29/23 Yes Laurence Spates, MD  tiZANidine (ZANAFLEX) 4 MG tablet TAKE 1 TABLET BY MOUTH NIGHTLY AS NEEDED    [provider]  triamcinolone cream (KENALOG) 0.1 % Apply 1 application topically 2 (two) times daily. 05/07/18   Benjiman Core D, PA-C  zolpidem (AMBIEN) 5 MG tablet Take 5 mg by mouth at bedtime as needed for sleep.    [provider]      Allergies    Codeine, Peanut-containing drug products, Penicillins, Shellfish allergy, and Tramadol    Review of Systems    Review of Systems Review of systems completed and notable as per HPI.  ROS otherwise negative.   Physical Exam Updated Vital Signs BP (!) 161/80 (BP Location: Right Arm)   Pulse 86   Temp 99.3 F (37.4 C) (Oral)   Resp 16   Ht 5\' 4"  (1.626 m)   Wt (!) 152.4 kg   SpO2 100%   BMI 57.67 kg/m  Physical Exam Vitals and nursing note reviewed.  Constitutional:      General: She is not in acute distress.    Appearance: She is well-developed.  HENT:     Head: Normocephalic and atraumatic.  Eyes:     Conjunctiva/sclera: Conjunctivae normal.  Cardiovascular:     Rate and Rhythm: Normal rate and regular rhythm.     Heart sounds: No murmur heard. Pulmonary:     Effort: Pulmonary effort is normal. No respiratory distress.     Breath sounds: Normal breath sounds.  Abdominal:     Palpations: Abdomen is soft.     Tenderness: There is no abdominal tenderness.  Musculoskeletal:        General: No swelling.     Cervical back: Neck supple.     Comments: Mild tenderness over the right paraspinal lumbar spine.  She is positive straight leg raise on the right.  She has normal sensation of the bilateral lower extremities, normal DP and PT pulses.  No skin changes.  No focal tenderness.  She has full strength at the hip, knee, ankle.  Skin:    General: Skin is warm and dry.     Capillary Refill: Capillary refill takes less than 2 seconds.  Neurological:     General: No focal deficit present.     Mental Status: She is alert and oriented to person, place, and time. Mental status is at baseline.  Psychiatric:        Mood and Affect: Mood normal.     ED Results / Procedures / Treatments   Labs (all labs ordered are listed, but only abnormal results are displayed) Labs Reviewed - No data to display  EKG None  Radiology No results found.  Procedures Procedures    Medications Ordered in ED Medications  ketorolac (TORADOL) 15 MG/ML injection 15 mg (has no administration in time  range)    ED Course/ Medical Decision Making/ A&P                                 Medical Decision Making  Medical Decision Making:   Molly Archer is a 42 y.o. female who presented to the ED today with exacerbation of chronic right L4 radiculopathy.  Vital signs reviewed.  On exam she is somewhat uncomfortable, but is able to ambulate without assistance.  She has full strength and normal sensation, no red flag symptoms for cauda equina including no bowel or  bladder changes, saddle anesthesia, or weakness.  Her symptoms are unchanged, she is not had any acute trauma and I do not think there is an indication for emergent imaging.  She tried many different medications and has tried conservative therapy as well without significant improvement.  Will give dose of Toradol here to help with her symptoms.  She does have history of non-insulin-dependent diabetes which is reasonly well-controlled per patient.  She states she is tolerated multiple rounds of steroids before without significant impact on her blood sugar.  I did discuss this with her, she would like to try some oral steroids to see if this helps her symptoms.  I will try a low-dose prednisone, she has materials at home to track her blood sugar, and I told her to stop the steroids if they are causing significant blood pressure elevation.  She is comfortable this plan.  I recommend she call her PCP and orthopedic surgeon tomorrow to schedule close follow-up.  Discharged in stable condition.    Reviewed and confirmed nursing documentation for past medical history, family history, social history.   Patient's presentation is most consistent with exacerbation of chronic illness.           Final Clinical Impression(s) / ED Diagnoses Final diagnoses:  Sciatica of right side    Rx / DC Orders ED Discharge Orders          Ordered    predniSONE (DELTASONE) 10 MG tablet  Daily        03/25/23 1546              Laurence Spates, MD 03/25/23 [phone]

## 2023-03-25 NOTE — Discharge Instructions (Addendum)
We gave you a dose of Toradol here and are going to try a low-dose course of steroids.  It is importantly keep track of her blood sugar and stop taking the steroids if it is causing her blood sugar to increase.  If you develop difficulty going to the bathroom, severe pain, weakness you should return to the ED.

## 2023-03-25 NOTE — ED Triage Notes (Signed)
Pt arrives to ED with c/o worsening right hip pain x1 week after PT. Pt notes hx of lumbar radiculopathy. Pt notes pain shoots down to foot/toe and she experiences intermittent numbness/tingling in leg.

## 2023-03-25 NOTE — Telephone Encounter (Signed)
Called pt already and discussed with her

## 2023-03-25 NOTE — Telephone Encounter (Signed)
I called and advised pt to either come see Molly Archer or go to the ED here at [facility]. Advised to be seen by one or the other. She stated understanding

## 2023-03-25 NOTE — ED Notes (Signed)
Pt given discharge instructions and reviewed prescriptions. Opportunities given for questions. Pt verbalizes understanding. Stone,Heather R, RN 

## 2023-03-25 NOTE — Telephone Encounter (Signed)
Patient called stating that she is having severe pain in her right leg and that she can barely walk.  Would like call back to discuss what can be done?  CB# [phone].  Please advise.  Thank you.

## 2023-03-26 ENCOUNTER — Encounter (HOSPITAL_BASED_OUTPATIENT_CLINIC_OR_DEPARTMENT_OTHER): Payer: Self-pay | Admitting: Physical Therapy

## 2023-03-26 ENCOUNTER — Ambulatory Visit (HOSPITAL_BASED_OUTPATIENT_CLINIC_OR_DEPARTMENT_OTHER): Payer: No Typology Code available for payment source | Admitting: Physical Therapy

## 2023-03-26 ENCOUNTER — Other Ambulatory Visit: Payer: Self-pay | Admitting: Surgical

## 2023-03-26 DIAGNOSIS — M25551 Pain in right hip: Secondary | ICD-10-CM

## 2023-03-26 DIAGNOSIS — M5459 Other low back pain: Secondary | ICD-10-CM

## 2023-03-26 DIAGNOSIS — R2689 Other abnormalities of gait and mobility: Secondary | ICD-10-CM

## 2023-03-26 MED ORDER — HYDROCODONE-ACETAMINOPHEN 5-325 MG PO TABS
1.0000 | ORAL_TABLET | Freq: Two times a day (BID) | ORAL | 0 refills | Status: DC | PRN
Start: 2023-03-26 — End: 2023-04-10
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
## 2023-03-27 ENCOUNTER — Telehealth: Payer: Self-pay | Admitting: Orthopaedic Surgery

## 2023-03-27 NOTE — Telephone Encounter (Signed)
Ok with this

## 2023-03-27 NOTE — Telephone Encounter (Signed)
[employer] STD forms received. To [employer].

## 2023-03-28 ENCOUNTER — Ambulatory Visit (HOSPITAL_BASED_OUTPATIENT_CLINIC_OR_DEPARTMENT_OTHER): Payer: No Typology Code available for payment source | Admitting: Physical Therapy

## 2023-03-30 ENCOUNTER — Other Ambulatory Visit (HOSPITAL_BASED_OUTPATIENT_CLINIC_OR_DEPARTMENT_OTHER): Payer: Self-pay | Admitting: Family Medicine

## 2023-04-02 ENCOUNTER — Ambulatory Visit (HOSPITAL_BASED_OUTPATIENT_CLINIC_OR_DEPARTMENT_OTHER): Payer: No Typology Code available for payment source | Admitting: Family Medicine

## 2023-04-02 ENCOUNTER — Other Ambulatory Visit: Payer: Self-pay

## 2023-04-02 ENCOUNTER — Ambulatory Visit (HOSPITAL_BASED_OUTPATIENT_CLINIC_OR_DEPARTMENT_OTHER): Payer: No Typology Code available for payment source | Admitting: Physical Therapy

## 2023-04-02 ENCOUNTER — Ambulatory Visit: Payer: No Typology Code available for payment source | Admitting: Physical Medicine and Rehabilitation

## 2023-04-02 VITALS — BP 133/79 | HR 89

## 2023-04-02 DIAGNOSIS — M5416 Radiculopathy, lumbar region: Secondary | ICD-10-CM

## 2023-04-02 MED ORDER — METHYLPREDNISOLONE ACETATE 80 MG/ML IJ SUSP
80.0000 mg | Freq: Once | INTRAMUSCULAR | Status: AC
  Administered 2023-04-02: 80 mg

## 2023-04-02 NOTE — Progress Notes (Signed)
Functional Pain Scale - descriptive words and definitions  Unmanageable (7)  Pain interferes with normal ADL's/nothing seems to help/sleep is very difficult/active distractions are very difficult to concentrate on. Severe range order  Average Pain 9-10   +Driver, -BT, -Dye Allergies.  Lower back pain on right side that radiates into the right leg to the foot

## 2023-04-02 NOTE — Procedures (Signed)
Lumbar Epidural Steroid Injection - Interlaminar Approach with Fluoroscopic Guidance  Patient: Molly Archer      Date of Birth: 11/28/1980 MRN: [id number] PCP: de [place], Raymond J, MD      Visit Date: 04/02/2023   Universal Protocol:     Consent Given By: the patient  Position: PRONE  Additional Comments: Vital signs were monitored before and after the procedure. Patient was prepped and draped in the usual sterile fashion. The correct patient, procedure, and site was verified.   Injection Procedure Details:   Procedure diagnoses: Lumbar radiculopathy [M54.16]   Meds Administered:  Meds ordered this encounter  Medications   methylPREDNISolone acetate (DEPO-MEDROL) injection 80 mg     Laterality: Right  Location/Site:  L4-5  Needle: 3.5 in., 20 ga. Tuohy  Needle Placement: Paramedian epidural  Findings:   -Comments: Excellent flow of contrast into the epidural space.  Procedure Details: Using a paramedian approach from the side mentioned above, the region overlying the inferior lamina was localized under fluoroscopic visualization and the soft tissues overlying this structure were infiltrated with 4 ml. of 1% Lidocaine without Epinephrine. The Tuohy needle was inserted into the epidural space using a paramedian approach.   The epidural space was localized using loss of resistance along with counter oblique bi-planar fluoroscopic views.  After negative aspirate for air, blood, and CSF, a 2 ml. volume of Isovue-250 was injected into the epidural space and the flow of contrast was observed. Radiographs were obtained for documentation purposes.    The injectate was administered into the level noted above.   Additional Comments:  No complications occurred Dressing: 2 x 2 sterile gauze and Band-Aid    Post-procedure details: Patient was observed during the procedure. Post-procedure instructions were reviewed.  Patient left the clinic in stable condition.

## 2023-04-02 NOTE — Patient Instructions (Signed)

## 2023-04-02 NOTE — Progress Notes (Signed)
Molly Archer - 42 y.o. female MRN [id number]  Date of birth: [birth year]  Office Visit Note: Visit Date: 04/02/2023 PCP: de Peru, Raymond J, MD Referred by: de [place], Raymond J, MD  Subjective: Chief Complaint  Patient presents with   Lower Back - Pain   HPI:  Molly Archer is a 42 y.o. female who comes in today at the request of Ellin Goodie, FNP for planned Right L4-5 Lumbar Interlaminar epidural steroid injection with fluoroscopic guidance.  The patient has failed conservative care including home exercise, medications, time and activity modification.  This injection will be diagnostic and hopefully therapeutic.  Please see requesting physician notes for further details and justification. Consider updated MRI if no help.   ROS Otherwise per HPI.  Assessment & Plan: Visit Diagnoses:    ICD-10-CM   1. Lumbar radiculopathy  M54.16 XR C-ARM NO REPORT    Epidural Steroid injection    methylPREDNISolone acetate (DEPO-MEDROL) injection 80 mg      Plan: No additional findings.   Meds & Orders:  Meds ordered this encounter  Medications   methylPREDNISolone acetate (DEPO-MEDROL) injection 80 mg    Orders Placed This Encounter  Procedures   XR C-ARM NO REPORT   Epidural Steroid injection    Follow-up: Return for visit to requesting provider as needed.   Procedures: No procedures performed  Lumbar Epidural Steroid Injection - Interlaminar Approach with Fluoroscopic Guidance  Patient: Molly Archer      Date of Birth: 25-Jan-1981 MRN: [id number] PCP: de Peru, Raymond J, MD      Visit Date: 04/02/2023   Universal Protocol:     Consent Given By: the patient  Position: PRONE  Additional Comments: Vital signs were monitored before and after the procedure. Patient was prepped and draped in the usual sterile fashion. The correct patient, procedure, and site was verified.   Injection Procedure Details:   Procedure diagnoses: Lumbar radiculopathy [M54.16]    Meds Administered:  Meds ordered this encounter  Medications   methylPREDNISolone acetate (DEPO-MEDROL) injection 80 mg     Laterality: Right  Location/Site:  L4-5  Needle: 3.5 in., 20 ga. Tuohy  Needle Placement: Paramedian epidural  Findings:   -Comments: Excellent flow of contrast into the epidural space.  Procedure Details: Using a paramedian approach from the side mentioned above, the region overlying the inferior lamina was localized under fluoroscopic visualization and the soft tissues overlying this structure were infiltrated with 4 ml. of 1% Lidocaine without Epinephrine. The Tuohy needle was inserted into the epidural space using a paramedian approach.   The epidural space was localized using loss of resistance along with counter oblique bi-planar fluoroscopic views.  After negative aspirate for air, blood, and CSF, a 2 ml. volume of Isovue-250 was injected into the epidural space and the flow of contrast was observed. Radiographs were obtained for documentation purposes.    The injectate was administered into the level noted above.   Additional Comments:  No complications occurred Dressing: 2 x 2 sterile gauze and Band-Aid    Post-procedure details: Patient was observed during the procedure. Post-procedure instructions were reviewed.  Patient left the clinic in stable condition.   Clinical History: Narrative & Impression CLINICAL DATA:  Initial evaluation for low back pain with buttock pain and right hip pain for 1 year.   EXAM: MRI LUMBAR SPINE WITHOUT CONTRAST   TECHNIQUE: Multiplanar, multisequence MR imaging of the lumbar spine was performed. No intravenous contrast was administered.   COMPARISON:  None available.   FINDINGS: Segmentation: Standard. Lowest well-formed disc space labeled the L5-S1 level.   Alignment: Physiologic with preservation of the normal lumbar lordosis. No listhesis.   Vertebrae: Vertebral body height maintained  without acute or chronic fracture. Bone marrow signal intensity diffusely decreased on T1 weighted sequence, nonspecific, but most commonly related to anemia, smoking or obesity. Subcentimeter benign hemangioma noted within the L2 vertebral body. No other discrete or worrisome osseous lesions. No abnormal marrow edema.   Conus medullaris and cauda equina: Conus extends to the L1 level. Conus and cauda equina appear normal.   Paraspinal and other soft tissues: Unremarkable.   Disc levels:   L1-2:  Unremarkable.   L2-3:  Unremarkable.   L3-4: Mild diffuse disc bulge with disc desiccation. Superimposed small right foraminal disc protrusion contacts the exiting right L3 nerve root (series 6, image 23). Mild bilateral facet hypertrophy. No spinal stenosis. Mild right greater than left L3 foraminal narrowing.   L4-5: Disc desiccation with mild disc bulge. Associated small central annular fissure. There is a subtle right foraminal disc protrusion with annular fissure contacting the exiting right L4 nerve root (series 6, image 29). Mild to moderate facet hypertrophy with associated trace joint effusions. Mild epidural lipomatosis. No significant spinal stenosis. Mild bilateral L4 foraminal narrowing, worse on the right.   L5-S1: Normal interspace. Mild facet hypertrophy. Epidural lipomatosis. No canal or foraminal stenosis. No impingement.   IMPRESSION: 1. Small right foraminal disc protrusions at L3-4 and L4-5, potentially affecting the exiting right L3 or L4 nerve roots respectively. 2. Mild to moderate facet hypertrophy at L3-4 through L5-S1. No significant spinal stenosis within the lumbar spine.     Electronically Signed   By: Rise Mu M.D.   On: 10/07/2021 23:59     Objective:  VS:  HT:    WT:   BMI:     BP:133/79  HR:89bpm  TEMP: ( )  RESP:  Physical Exam Vitals and nursing note reviewed.  Constitutional:      General: She is not in acute  distress.    Appearance: Normal appearance. She is obese. She is not ill-appearing.  HENT:     Head: Normocephalic and atraumatic.     Right Ear: External ear normal.     Left Ear: External ear normal.  Eyes:     Extraocular Movements: Extraocular movements intact.  Cardiovascular:     Rate and Rhythm: Normal rate.     Pulses: Normal pulses.  Pulmonary:     Effort: Pulmonary effort is normal. No respiratory distress.  Abdominal:     General: There is no distension.     Palpations: Abdomen is soft.  Musculoskeletal:        General: Tenderness present.     Cervical back: Neck supple.     Right lower leg: No edema.     Left lower leg: No edema.     Comments: Patient has good distal strength with no pain over the greater trochanters.  No clonus or focal weakness.  Skin:    Findings: No erythema, lesion or rash.  Neurological:     General: No focal deficit present.     Mental Status: She is alert and oriented to person, place, and time.     Sensory: No sensory deficit.     Motor: No weakness or abnormal muscle tone.     Coordination: Coordination normal.  Psychiatric:        Mood and Affect: Mood normal.  Behavior: Behavior normal.      Imaging: XR C-ARM NO REPORT  Result Date: 04/02/2023 Please see Notes tab for imaging impression.

## 2023-04-04 ENCOUNTER — Other Ambulatory Visit (HOSPITAL_BASED_OUTPATIENT_CLINIC_OR_DEPARTMENT_OTHER): Payer: Self-pay

## 2023-04-04 ENCOUNTER — Ambulatory Visit (HOSPITAL_BASED_OUTPATIENT_CLINIC_OR_DEPARTMENT_OTHER): Payer: No Typology Code available for payment source | Admitting: Physical Therapy

## 2023-04-04 MED ORDER — ZOLPIDEM TARTRATE 5 MG PO TABS
5.0000 mg | ORAL_TABLET | Freq: Every evening | ORAL | 0 refills | Status: DC | PRN
Start: 2023-04-04 — End: 2023-06-25
  Filled 2023-04-04: qty 30, 60d supply, fill #0

## 2023-04-08 ENCOUNTER — Other Ambulatory Visit (HOSPITAL_BASED_OUTPATIENT_CLINIC_OR_DEPARTMENT_OTHER): Payer: Self-pay | Admitting: Family Medicine

## 2023-04-08 ENCOUNTER — Other Ambulatory Visit: Payer: Self-pay

## 2023-04-08 ENCOUNTER — Other Ambulatory Visit (HOSPITAL_BASED_OUTPATIENT_CLINIC_OR_DEPARTMENT_OTHER): Payer: Self-pay

## 2023-04-08 MED ORDER — CLOBETASOL PROPIONATE 0.05 % EX OINT
TOPICAL_OINTMENT | Freq: Two times a day (BID) | CUTANEOUS | 1 refills | Status: DC
  Filled 2023-04-08: qty 30, 20d supply, fill #0
  Filled 2023-04-08: qty 30, 30d supply, fill #0
  Filled 2023-06-17: qty 30, 20d supply, fill #1

## 2023-04-08 MED ORDER — CICLOPIROX 1 % EX SHAM
1.0000 | MEDICATED_SHAMPOO | Freq: Every day | CUTANEOUS | 0 refills | Status: DC
  Filled 2023-04-08: qty 120, 30d supply, fill #0

## 2023-04-09 ENCOUNTER — Other Ambulatory Visit: Payer: Self-pay | Admitting: Physical Medicine and Rehabilitation

## 2023-04-09 ENCOUNTER — Encounter: Payer: Self-pay | Admitting: Physical Medicine and Rehabilitation

## 2023-04-10 ENCOUNTER — Other Ambulatory Visit: Payer: Self-pay | Admitting: Physical Medicine and Rehabilitation

## 2023-04-10 DIAGNOSIS — M5416 Radiculopathy, lumbar region: Secondary | ICD-10-CM

## 2023-04-10 DIAGNOSIS — M5116 Intervertebral disc disorders with radiculopathy, lumbar region: Secondary | ICD-10-CM

## 2023-04-10 MED ORDER — HYDROCODONE-ACETAMINOPHEN 5-325 MG PO TABS: 1.0000 | ORAL_TABLET | Freq: Two times a day (BID) | ORAL | 0 refills | Status: DC | PRN

## 2023-04-16 ENCOUNTER — Other Ambulatory Visit: Payer: Self-pay | Admitting: Physical Medicine and Rehabilitation

## 2023-04-16 ENCOUNTER — Other Ambulatory Visit (HOSPITAL_BASED_OUTPATIENT_CLINIC_OR_DEPARTMENT_OTHER): Payer: Self-pay

## 2023-04-16 ENCOUNTER — Ambulatory Visit (HOSPITAL_BASED_OUTPATIENT_CLINIC_OR_DEPARTMENT_OTHER): Payer: No Typology Code available for payment source | Admitting: Family Medicine

## 2023-04-16 ENCOUNTER — Encounter (HOSPITAL_BASED_OUTPATIENT_CLINIC_OR_DEPARTMENT_OTHER): Payer: Self-pay | Admitting: Family Medicine

## 2023-04-16 ENCOUNTER — Other Ambulatory Visit: Payer: Self-pay

## 2023-04-16 VITALS — BP 129/77 | HR 85 | Ht 65.0 in | Wt 330.5 lb

## 2023-04-16 DIAGNOSIS — S335XXA Sprain of ligaments of lumbar spine, initial encounter: Secondary | ICD-10-CM | POA: Insufficient documentation

## 2023-04-16 DIAGNOSIS — M9262 Juvenile osteochondrosis of tarsus, left ankle: Secondary | ICD-10-CM | POA: Insufficient documentation

## 2023-04-16 DIAGNOSIS — N898 Other specified noninflammatory disorders of vagina: Secondary | ICD-10-CM | POA: Diagnosis not present

## 2023-04-16 DIAGNOSIS — M2241 Chondromalacia patellae, right knee: Secondary | ICD-10-CM | POA: Insufficient documentation

## 2023-04-16 DIAGNOSIS — M4302 Spondylolysis, cervical region: Secondary | ICD-10-CM | POA: Insufficient documentation

## 2023-04-16 DIAGNOSIS — S63502A Unspecified sprain of left wrist, initial encounter: Secondary | ICD-10-CM | POA: Insufficient documentation

## 2023-04-16 DIAGNOSIS — E1165 Type 2 diabetes mellitus with hyperglycemia: Secondary | ICD-10-CM | POA: Diagnosis not present

## 2023-04-16 DIAGNOSIS — S139XXA Sprain of joints and ligaments of unspecified parts of neck, initial encounter: Secondary | ICD-10-CM | POA: Insufficient documentation

## 2023-04-16 DIAGNOSIS — Z13228 Encounter for screening for other metabolic disorders: Secondary | ICD-10-CM | POA: Insufficient documentation

## 2023-04-16 DIAGNOSIS — M2392 Unspecified internal derangement of left knee: Secondary | ICD-10-CM | POA: Insufficient documentation

## 2023-04-16 MED ORDER — DIAZEPAM 5 MG PO TABS
ORAL_TABLET | ORAL | 0 refills | Status: DC
Start: 2023-04-16 — End: 2023-05-15

## 2023-04-16 MED ORDER — GLIPIZIDE ER 10 MG PO TB24
10.0000 mg | ORAL_TABLET | Freq: Every day | ORAL | 1 refills | Status: DC
  Filled 2023-04-16: qty 90, 90d supply, fill #0
  Filled 2023-08-19: qty 90, 90d supply, fill #1

## 2023-04-16 MED ORDER — NAFTIFINE HCL 2 % EX CREA
TOPICAL_CREAM | CUTANEOUS | 1 refills | Status: DC
Start: 2023-04-16 — End: 2023-12-21
  Filled 2023-04-16: qty 60, 30d supply, fill #0
  Filled 2023-06-27: qty 60, 30d supply, fill #1

## 2023-04-16 NOTE — Assessment & Plan Note (Signed)
As below, patient has had symptoms, likely indicating side effect of Jardiance.  At this time, we will have patient stop Jardiance, treat infection as below.  We discussed medication options today including oral medication options, GLP-1 receptor agonist injectable.  At this time, patient would prefer to proceed with dose titration of glipizide.  We will increase to 10 mg daily for glipizide.  Advised on monitoring for any low blood sugar symptoms or readings.  Will plan for follow-up in about 3 months to assess progress, plan to recheck hemoglobin A1c around that time

## 2023-04-16 NOTE — Progress Notes (Signed)
    Procedures performed today:    None.  Independent interpretation of notes and tests performed by another provider:   None.  Brief History, Exam, Impression, and Recommendations:    BP 129/77 (BP Location: Right Arm, Patient Position: Sitting, Cuff Size: Normal)   Pulse 85   Ht 5\' 5"  (1.651 m)   Wt (!) 330 lb 8 oz (149.9 kg)   SpO2 100%   BMI 55.00 kg/m   Type 2 diabetes mellitus with hyperglycemia, without long-term current use of insulin (HCC) Assessment & Plan: As below, patient has had symptoms, likely indicating side effect of Jardiance.  At this time, we will have patient stop Jardiance, treat infection as below.  We discussed medication options today including oral medication options, GLP-1 receptor agonist injectable.  At this time, patient would prefer to proceed with dose titration of glipizide.  We will increase to 10 mg daily for glipizide.  Advised on monitoring for any low blood sugar symptoms or readings.  Will plan for follow-up in about 3 months to assess progress, plan to recheck hemoglobin A1c around that time  Orders: -     Amb ref to Medical Nutrition Therapy-MNT  Vaginal discharge Assessment & Plan: Patient reports that she started Jardiance about 1 week ago and a couple days later she began to experience vaginal itching and discharge.  She indicates that she has had similar symptoms in the past with yeast infection.  She has tried Monistat with some mild improvement in symptoms. Discussed that new medication, Jardiance, can be associated with infections such as UTI and yeast infection.  Given timing of symptoms related to new medication, suspect that [place] Pepper is culprit which led to onset of current symptoms.  At this time, we will proceed with treatment utilizing fluconazole, also advised patient to stop Jardiance.  Would expect symptom resolution with fluconazole.  Advised to contact our office should symptoms persist   Other orders -     glipiZIDE ER;  Take 1 tablet (10 mg total) by mouth daily.  Dispense: 90 tablet; Refill: 1 -     Naftifine HCl; Apply liberally to affected area daily  Dispense: 60 g; Refill: 1  Return in about 3 months (around 07/16/2023) for diabetes.  Spent 32 minutes on this patient encounter, including preparation, chart review, face-to-face counseling with patient and coordination of care, and documentation of encounter   ___________________________________________ Lisle Skillman de [place], MD, ABFM, [facility] Primary Care and Sports Medicine [facility]

## 2023-04-16 NOTE — Assessment & Plan Note (Signed)
Patient reports that she started Jardiance about 1 week ago and a couple days later she began to experience vaginal itching and discharge.  She indicates that she has had similar symptoms in the past with yeast infection.  She has tried Monistat with some mild improvement in symptoms. Discussed that new medication, Jardiance, can be associated with infections such as UTI and yeast infection.  Given timing of symptoms related to new medication, suspect that Molly Archer is culprit which led to onset of current symptoms.  At this time, we will proceed with treatment utilizing fluconazole, also advised patient to stop Jardiance.  Would expect symptom resolution with fluconazole.  Advised to contact our office should symptoms persist

## 2023-04-17 ENCOUNTER — Ambulatory Visit (HOSPITAL_BASED_OUTPATIENT_CLINIC_OR_DEPARTMENT_OTHER): Payer: No Typology Code available for payment source | Admitting: Orthopaedic Surgery

## 2023-04-17 ENCOUNTER — Other Ambulatory Visit (HOSPITAL_BASED_OUTPATIENT_CLINIC_OR_DEPARTMENT_OTHER): Payer: Self-pay

## 2023-04-17 ENCOUNTER — Encounter (HOSPITAL_BASED_OUTPATIENT_CLINIC_OR_DEPARTMENT_OTHER): Payer: Self-pay | Admitting: Orthopaedic Surgery

## 2023-04-17 ENCOUNTER — Other Ambulatory Visit: Payer: Self-pay

## 2023-04-17 ENCOUNTER — Ambulatory Visit (HOSPITAL_BASED_OUTPATIENT_CLINIC_OR_DEPARTMENT_OTHER): Payer: No Typology Code available for payment source

## 2023-04-17 DIAGNOSIS — M25551 Pain in right hip: Secondary | ICD-10-CM | POA: Diagnosis not present

## 2023-04-17 DIAGNOSIS — M25561 Pain in right knee: Secondary | ICD-10-CM

## 2023-04-17 MED ORDER — METHYLPREDNISOLONE 4 MG PO TBPK
ORAL_TABLET | ORAL | 0 refills | Status: DC
  Filled 2023-04-17: qty 21, 6d supply, fill #0

## 2023-04-17 NOTE — Addendum Note (Signed)
Addended by: Jeanella Cara on: 04/17/2023 05:06 PM   Modules accepted: Orders
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
## 2023-04-19 ENCOUNTER — Ambulatory Visit (HOSPITAL_BASED_OUTPATIENT_CLINIC_OR_DEPARTMENT_OTHER): Payer: No Typology Code available for payment source | Admitting: Family Medicine

## 2023-04-24 ENCOUNTER — Ambulatory Visit: Payer: No Typology Code available for payment source | Admitting: Physical Medicine and Rehabilitation

## 2023-04-24 ENCOUNTER — Other Ambulatory Visit: Payer: Self-pay

## 2023-04-24 VITALS — BP 114/72 | HR 86

## 2023-04-24 DIAGNOSIS — M5416 Radiculopathy, lumbar region: Secondary | ICD-10-CM | POA: Diagnosis not present

## 2023-04-24 MED ORDER — METHYLPREDNISOLONE ACETATE 80 MG/ML IJ SUSP
80.0000 mg | Freq: Once | INTRAMUSCULAR | Status: AC
  Administered 2023-04-24: 80 mg

## 2023-04-24 NOTE — Patient Instructions (Signed)

## 2023-04-24 NOTE — Progress Notes (Signed)
Functional Pain Scale - descriptive words and definitions  Distressing (6)    Pain is present/unable to complete most ADLs limited by pain/sleep is difficult and active distraction is only marginal. Moderate range order  Average Pain 8   +Driver, -BT, -Dye Allergies.  Lower back pain on right side that radiates into the right leg to the foot

## 2023-04-25 ENCOUNTER — Ambulatory Visit (HOSPITAL_BASED_OUTPATIENT_CLINIC_OR_DEPARTMENT_OTHER): Payer: No Typology Code available for payment source | Admitting: Family Medicine

## 2023-04-26 NOTE — Procedures (Signed)
Lumbosacral Transforaminal Epidural Steroid Injection - Sub-Pedicular Approach with Fluoroscopic Guidance  Patient: Molly Archer      Date of Birth: [birth year] MRN: [id number] PCP: de [place], Raymond J, MD      Visit Date: 04/24/2023   Universal Protocol:    Date/Time: 04/24/2023  Consent Given By: the patient  Position: PRONE  Additional Comments: Vital signs were monitored before and after the procedure. Patient was prepped and draped in the usual sterile fashion. The correct patient, procedure, and site was verified.   Injection Procedure Details:   Procedure diagnoses: Lumbar radiculopathy [M54.16]    Meds Administered:  Meds ordered this encounter  Medications   methylPREDNISolone acetate (DEPO-MEDROL) injection 80 mg    Laterality: Right  Location/Site: L4  Needle:7.0 in., 22 ga.  Short bevel or Quincke spinal needle  Needle Placement: Transforaminal  Findings:    -Comments: Excellent flow of contrast along the nerve, nerve root and into the epidural space.  Procedure Details: After squaring off the end-plates to get a true AP view, the C-arm was positioned so that an oblique view of the foramen as noted above was visualized. The target area is just inferior to the "nose of the scotty dog" or sub pedicular. The soft tissues overlying this structure were infiltrated with 2-3 ml. of 1% Lidocaine without Epinephrine.  The spinal needle was inserted toward the target using a "trajectory" view along the fluoroscope beam.  Under AP and lateral visualization, the needle was advanced so it did not puncture dura and was located close the 6 O'Clock position of the pedical in AP tracterory. Biplanar projections were used to confirm position. Aspiration was confirmed to be negative for CSF and/or blood. A 1-2 ml. volume of Isovue-250 was injected and flow of contrast was noted at each level. Radiographs were obtained for documentation purposes.   After attaining the desired  flow of contrast documented above, a 0.5 to 1.0 ml test dose of 0.25% Marcaine was injected into each respective transforaminal space.  The patient was observed for 90 seconds post injection.  After no sensory deficits were reported, and normal lower extremity motor function was noted,   the above injectate was administered so that equal amounts of the injectate were placed at each foramen (level) into the transforaminal epidural space.   Additional Comments:  The patient tolerated the procedure well Dressing: 2 x 2 sterile gauze and Band-Aid    Post-procedure details: Patient was observed during the procedure. Post-procedure instructions were reviewed.  Patient left the clinic in stable condition.

## 2023-04-26 NOTE — Progress Notes (Signed)
Molly Archer - 42 y.o. female MRN [id number]  Date of birth: October 11, 1980  Office Visit Note: Visit Date: 04/24/2023 PCP: de [place], Raymond J, MD Referred by: de [place], Raymond J, MD  Subjective: Chief Complaint  Patient presents with   Lower Back - Pain   HPI:  Molly Archer is a 42 y.o. female who comes in today at the request of Ellin Goodie, FNP for planned Right L4-5 Lumbar Transforaminal epidural steroid injection with fluoroscopic guidance.  The patient has failed conservative care including home exercise, medications, time and activity modification.  This injection will be diagnostic and hopefully therapeutic.  Please see requesting physician notes for further details and justification.   ROS Otherwise per HPI.  Assessment & Plan: Visit Diagnoses:    ICD-10-CM   1. Lumbar radiculopathy  M54.16 XR C-ARM NO REPORT    Epidural Steroid injection    methylPREDNISolone acetate (DEPO-MEDROL) injection 80 mg      Plan: No additional findings.   Meds & Orders:  Meds ordered this encounter  Medications   methylPREDNISolone acetate (DEPO-MEDROL) injection 80 mg    Orders Placed This Encounter  Procedures   XR C-ARM NO REPORT   Epidural Steroid injection    Follow-up: Return for visit to requesting provider as needed.   Procedures: No procedures performed  Lumbosacral Transforaminal Epidural Steroid Injection - Sub-Pedicular Approach with Fluoroscopic Guidance  Patient: Molly Archer      Date of Birth: 1981/03/19 MRN: [id number] PCP: de [place], Raymond J, MD      Visit Date: 04/24/2023   Universal Protocol:    Date/Time: 04/24/2023  Consent Given By: the patient  Position: PRONE  Additional Comments: Vital signs were monitored before and after the procedure. Patient was prepped and draped in the usual sterile fashion. The correct patient, procedure, and site was verified.   Injection Procedure Details:   Procedure diagnoses: Lumbar radiculopathy  [M54.16]    Meds Administered:  Meds ordered this encounter  Medications   methylPREDNISolone acetate (DEPO-MEDROL) injection 80 mg    Laterality: Right  Location/Site: L4  Needle:7.0 in., 22 ga.  Short bevel or Quincke spinal needle  Needle Placement: Transforaminal  Findings:    -Comments: Excellent flow of contrast along the nerve, nerve root and into the epidural space.  Procedure Details: After squaring off the end-plates to get a true AP view, the C-arm was positioned so that an oblique view of the foramen as noted above was visualized. The target area is just inferior to the "nose of the scotty dog" or sub pedicular. The soft tissues overlying this structure were infiltrated with 2-3 ml. of 1% Lidocaine without Epinephrine.  The spinal needle was inserted toward the target using a "trajectory" view along the fluoroscope beam.  Under AP and lateral visualization, the needle was advanced so it did not puncture dura and was located close the 6 O'Clock position of the pedical in AP tracterory. Biplanar projections were used to confirm position. Aspiration was confirmed to be negative for CSF and/or blood. A 1-2 ml. volume of Isovue-250 was injected and flow of contrast was noted at each level. Radiographs were obtained for documentation purposes.   After attaining the desired flow of contrast documented above, a 0.5 to 1.0 ml test dose of 0.25% Marcaine was injected into each respective transforaminal space.  The patient was observed for 90 seconds post injection.  After no sensory deficits were reported, and normal lower extremity motor function was noted,  the above injectate was administered so that equal amounts of the injectate were placed at each foramen (level) into the transforaminal epidural space.   Additional Comments:  The patient tolerated the procedure well Dressing: 2 x 2 sterile gauze and Band-Aid    Post-procedure details: Patient was observed during the  procedure. Post-procedure instructions were reviewed.  Patient left the clinic in stable condition.    Clinical History: Narrative & Impression CLINICAL DATA:  Initial evaluation for low back pain with buttock pain and right hip pain for 1 year.   EXAM: MRI LUMBAR SPINE WITHOUT CONTRAST   TECHNIQUE: Multiplanar, multisequence MR imaging of the lumbar spine was performed. No intravenous contrast was administered.   COMPARISON:  None available.   FINDINGS: Segmentation: Standard. Lowest well-formed disc space labeled the L5-S1 level.   Alignment: Physiologic with preservation of the normal lumbar lordosis. No listhesis.   Vertebrae: Vertebral body height maintained without acute or chronic fracture. Bone marrow signal intensity diffusely decreased on T1 weighted sequence, nonspecific, but most commonly related to anemia, smoking or obesity. Subcentimeter benign hemangioma noted within the L2 vertebral body. No other discrete or worrisome osseous lesions. No abnormal marrow edema.   Conus medullaris and cauda equina: Conus extends to the L1 level. Conus and cauda equina appear normal.   Paraspinal and other soft tissues: Unremarkable.   Disc levels:   L1-2:  Unremarkable.   L2-3:  Unremarkable.   L3-4: Mild diffuse disc bulge with disc desiccation. Superimposed small right foraminal disc protrusion contacts the exiting right L3 nerve root (series 6, image 23). Mild bilateral facet hypertrophy. No spinal stenosis. Mild right greater than left L3 foraminal narrowing.   L4-5: Disc desiccation with mild disc bulge. Associated small central annular fissure. There is a subtle right foraminal disc protrusion with annular fissure contacting the exiting right L4 nerve root (series 6, image 29). Mild to moderate facet hypertrophy with associated trace joint effusions. Mild epidural lipomatosis. No significant spinal stenosis. Mild bilateral L4 foraminal narrowing, worse on  the right.   L5-S1: Normal interspace. Mild facet hypertrophy. Epidural lipomatosis. No canal or foraminal stenosis. No impingement.   IMPRESSION: 1. Small right foraminal disc protrusions at L3-4 and L4-5, potentially affecting the exiting right L3 or L4 nerve roots respectively. 2. Mild to moderate facet hypertrophy at L3-4 through L5-S1. No significant spinal stenosis within the lumbar spine.     Electronically Signed   By: Rise Mu M.D.   On: 10/07/2021 23:59     Objective:  VS:  HT:    WT:   BMI:     BP:114/72  HR:86bpm  TEMP: ( )  RESP:  Physical Exam Vitals and nursing note reviewed.  Constitutional:      General: She is not in acute distress.    Appearance: Normal appearance. She is obese. She is not ill-appearing.  HENT:     Head: Normocephalic and atraumatic.     Right Ear: External ear normal.     Left Ear: External ear normal.  Eyes:     Extraocular Movements: Extraocular movements intact.  Cardiovascular:     Rate and Rhythm: Normal rate.     Pulses: Normal pulses.  Pulmonary:     Effort: Pulmonary effort is normal. No respiratory distress.  Abdominal:     General: There is no distension.     Palpations: Abdomen is soft.  Musculoskeletal:        General: Tenderness present.     Cervical back: Neck supple.  Right lower leg: No edema.     Left lower leg: No edema.     Comments: Patient has good distal strength with no pain over the greater trochanters.  No clonus or focal weakness.  Skin:    Findings: No erythema, lesion or rash.  Neurological:     General: No focal deficit present.     Mental Status: She is alert and oriented to person, place, and time.     Sensory: No sensory deficit.     Motor: No weakness or abnormal muscle tone.     Coordination: Coordination normal.  Psychiatric:        Mood and Affect: Mood normal.        Behavior: Behavior normal.      Imaging: No results found.

## 2023-04-30 ENCOUNTER — Encounter: Payer: Self-pay | Admitting: Physical Medicine & Rehabilitation

## 2023-05-03 ENCOUNTER — Encounter (HOSPITAL_BASED_OUTPATIENT_CLINIC_OR_DEPARTMENT_OTHER): Payer: Self-pay | Admitting: Orthopaedic Surgery

## 2023-05-03 ENCOUNTER — Encounter: Payer: Self-pay | Admitting: Physical Medicine and Rehabilitation

## 2023-05-06 ENCOUNTER — Other Ambulatory Visit: Payer: Self-pay | Admitting: Physical Medicine and Rehabilitation

## 2023-05-06 DIAGNOSIS — M5416 Radiculopathy, lumbar region: Secondary | ICD-10-CM

## 2023-05-07 ENCOUNTER — Other Ambulatory Visit (HOSPITAL_BASED_OUTPATIENT_CLINIC_OR_DEPARTMENT_OTHER): Payer: Self-pay

## 2023-05-13 ENCOUNTER — Encounter: Payer: Self-pay | Admitting: Physical Medicine and Rehabilitation

## 2023-05-15 ENCOUNTER — Encounter
Payer: No Typology Code available for payment source | Attending: Physical Medicine & Rehabilitation | Admitting: Physical Medicine & Rehabilitation

## 2023-05-15 ENCOUNTER — Other Ambulatory Visit (HOSPITAL_BASED_OUTPATIENT_CLINIC_OR_DEPARTMENT_OTHER): Payer: Self-pay

## 2023-05-15 ENCOUNTER — Encounter: Payer: Self-pay | Admitting: Physical Medicine and Rehabilitation

## 2023-05-15 ENCOUNTER — Encounter: Payer: Self-pay | Admitting: Physical Medicine & Rehabilitation

## 2023-05-15 VITALS — BP 125/73 | HR 85 | Ht 65.0 in | Wt 332.4 lb

## 2023-05-15 DIAGNOSIS — M1711 Unilateral primary osteoarthritis, right knee: Secondary | ICD-10-CM | POA: Insufficient documentation

## 2023-05-15 DIAGNOSIS — Z79891 Long term (current) use of opiate analgesic: Secondary | ICD-10-CM | POA: Insufficient documentation

## 2023-05-15 DIAGNOSIS — Z5181 Encounter for therapeutic drug level monitoring: Secondary | ICD-10-CM | POA: Diagnosis present

## 2023-05-15 DIAGNOSIS — Z6841 Body Mass Index (BMI) 40.0 and over, adult: Secondary | ICD-10-CM

## 2023-05-15 DIAGNOSIS — M545 Low back pain, unspecified: Secondary | ICD-10-CM | POA: Diagnosis not present

## 2023-05-15 DIAGNOSIS — G894 Chronic pain syndrome: Secondary | ICD-10-CM | POA: Insufficient documentation

## 2023-05-15 DIAGNOSIS — M25551 Pain in right hip: Secondary | ICD-10-CM | POA: Diagnosis present

## 2023-05-15 DIAGNOSIS — M170 Bilateral primary osteoarthritis of knee: Secondary | ICD-10-CM

## 2023-05-15 DIAGNOSIS — M47816 Spondylosis without myelopathy or radiculopathy, lumbar region: Secondary | ICD-10-CM | POA: Diagnosis present

## 2023-05-15 MED ORDER — OXYCODONE HCL 5 MG PO TABS
5.0000 mg | ORAL_TABLET | Freq: Two times a day (BID) | ORAL | 0 refills | Status: DC | PRN
Start: 2023-05-15 — End: 2023-05-29
  Filled 2023-05-15: qty 60, 30d supply, fill #0
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
## 2023-05-15 NOTE — Patient Instructions (Addendum)
ALWAYS FEEL FREE TO CALL OUR OFFICE WITH ANY PROBLEMS OR QUESTIONS [phone])  **PLEASE NOTE** ALL MEDICATION REFILL REQUESTS (INCLUDING CONTROLLED SUBSTANCES) NEED TO BE MADE AT LEAST 7 DAYS PRIOR TO REFILL BEING DUE. ANY REFILL REQUESTS INSIDE THAT TIME FRAME MAY RESULT IN DELAYS IN RECEIVING YOUR PRESCRIPTION.                    SUPPLEMENTS USEFUL FOR OSTEOARTHRITIS: OMEGA 3 FATTY ACIDS, TURMERIC, GINGER, TART CHERRY EXTRACT, CELERY SEED, GLUCOSAMINE WITH CHONDROITIN

## 2023-05-16 ENCOUNTER — Ambulatory Visit: Payer: No Typology Code available for payment source | Admitting: Licensed Clinical Social Worker

## 2023-05-16 ENCOUNTER — Encounter: Payer: Self-pay | Admitting: Physical Medicine and Rehabilitation

## 2023-05-16 DIAGNOSIS — F4322 Adjustment disorder with anxiety: Secondary | ICD-10-CM | POA: Diagnosis not present
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
## 2023-05-19 ENCOUNTER — Ambulatory Visit
Admission: RE | Admit: 2023-05-19 | Discharge: 2023-05-19 | Disposition: A | Discharge status: Home / Self Care | Payer: No Typology Code available for payment source | Source: Ambulatory Visit | Attending: Physical Medicine and Rehabilitation | Admitting: Physical Medicine and Rehabilitation

## 2023-05-19 DIAGNOSIS — M5416 Radiculopathy, lumbar region: Secondary | ICD-10-CM

## 2023-05-20 LAB — TOXASSURE SELECT,+ANTIDEPR,UR

## 2023-05-21 ENCOUNTER — Ambulatory Visit (HOSPITAL_BASED_OUTPATIENT_CLINIC_OR_DEPARTMENT_OTHER): Payer: No Typology Code available for payment source | Admitting: Cardiology

## 2023-05-21 ENCOUNTER — Encounter (HOSPITAL_BASED_OUTPATIENT_CLINIC_OR_DEPARTMENT_OTHER): Payer: Self-pay | Admitting: Cardiology

## 2023-05-21 VITALS — BP 130/60 | HR 84 | Ht 65.0 in | Wt 338.0 lb

## 2023-05-21 DIAGNOSIS — E119 Type 2 diabetes mellitus without complications: Secondary | ICD-10-CM | POA: Diagnosis not present

## 2023-05-21 DIAGNOSIS — Z7189 Other specified counseling: Secondary | ICD-10-CM

## 2023-05-21 DIAGNOSIS — I10 Essential (primary) hypertension: Secondary | ICD-10-CM | POA: Diagnosis not present

## 2023-05-21 DIAGNOSIS — Z6841 Body Mass Index (BMI) 40.0 and over, adult: Secondary | ICD-10-CM

## 2023-05-21 NOTE — Progress Notes (Signed)
Cardiology Office Note:  .    Date:  05/21/2023  ID:  Molly Archer, DOB 09-Jul-1981, MRN [id number] PCP: de Peru, Raymond J, MD  [facility] Providers Cardiologist:  Jodelle Red, MD     History of Present Illness: .    Molly Archer is a 42 y.o. female with a hx of hypertension, type 2 diabetes, arthritis, and obesity, here for the evaluation of hypertension at the request of Dr. Marcy Salvo de Peru. She was seen by her PCP 03/04/2023 and her blood pressure was 145/75 on a regimen of doxazosin, irbesartan-HCTZ, nifedipine. She reported a history of ICH and was also referred to neurology, has upcoming appointment in 06/2023. Had prior sleep study which was negative for sleep apnea. Has a history of type II diabetes, obesity with BMI 55. Previously followed by cardiology at [facility], though no records are available in Care Everywhere.   Cardiovascular risk factors: Prior clinical ASCVD: None. Comorbid conditions: Hypertension - "up and down". Occasionally experiences intense headaches that may last for prolonged periods associated with high blood pressures, exacerbated by movement. Previously was intolerant of either amlodipine or losartan due to side effects of headaches. She started doxazosin within the past year; she did not feel any differently after starting this. She affirms a history of intracranial hypertension. Diabetes - diagnosed about 2 years ago. She had been prediabetic for years. Previously intolerant of Rybelsus. Metabolic syndrome/Obesity: Current weight 332 lbs. Chronic inflammatory conditions: None. Tobacco use history: Never. Family history: Mostly hypertension, diabetes. She has several relatives who require insulin. History of cancer predominantly in her maternal family.   Prior pertinent testing and/or incidental findings: Seen by cardiology a long time ago at [facility]. Reportedly has a history of heart murmur. Had prior EKGs but no prior  echocardiogram. Exercise level: She complains of right-sided pain when walking. In the past she has completed aquatic therapy. She has been following with orthopedics and has been diagnosed with arthritis in her knee, and two spinal protrusions. She was previously on medical leave and has returned to work since yesterday. Usually she tries to walk for exercise but has been limited by her orthopedic issues. She uses an elliptical machine at home. Current diet: On her current diabetic medications, she states that she is struggling to eat due to loss of appetite. Therefore her diet is fairly inconsistent aside from breakfast which she is usually able to tolerate better. Previously she was working with the [facility] but had to stop as her insurance would not cover the cost. She continues to follow a meal plan provided by the clinic.  She denies any palpitations, chest pain, shortness of breath, lightheadedness, syncope, orthopnea, or PND.  ROS:  Please see the history of present illness. ROS otherwise negative except as noted.  (+) Headaches (+) Right-sided pain with walking (+) Loss of appetite  Studies Reviewed: Marland Kitchen    EKG Interpretation Date/Time:  Tuesday May 21 2023 16:33:19 EST Ventricular Rate:  84 PR Interval:  148 QRS Duration:  80 QT Interval:  348 QTC Calculation: 411 R Axis:   77  Text Interpretation: Normal sinus rhythm Nonspecific T wave abnormality Confirmed by Jodelle Red [phone]) on 06/30/2023 11:05:08 PM    Physical Exam:    VS:  BP 130/60   Pulse 84   Ht 5\' 5"  (1.651 m)   Wt (!) 338 lb (153.3 kg)   SpO2 96%   BMI 56.25 kg/m    Wt Readings from Last  3 Encounters:  05/21/23 (!) 338 lb (153.3 kg)  05/15/23 (!) 332 lb 6.4 oz (150.8 kg)  04/16/23 (!) 330 lb 8 oz (149.9 kg)    GEN: Well nourished, well developed in no acute distress HEENT: Normal, moist mucous membranes NECK: No JVD CARDIAC: regular rhythm, normal S1 and S2, no  rubs or gallops. 1/6 systolic murmur. VASCULAR: Radial and DP pulses 2+ bilaterally. No carotid bruits RESPIRATORY:  Clear to auscultation without rales, wheezing or rhonchi  ABDOMEN: Soft, non-tender, non-distended MUSCULOSKELETAL:  Ambulates independently SKIN: Warm and dry, no edema NEUROLOGIC:  Alert and oriented x 3. No focal neuro deficits noted. PSYCHIATRIC:  Normal affect   ASSESSMENT AND PLAN: .    Hypertension -at goal today -continue doxazosin, hydrochlorothiazide, irbesartan, nifedipine -had unclear side effect with amlodipine  Type II diabetes, with obesity, not on insulin BMI 57 -had gyn infection on Jardiance, stopped -on glipizide -discussed GLP1RA at length today. I think this would be an excellent option for her. She will discuss with Dr. De Peru -consider aspirin/statin at follow up  CV risk counseling and prevention -recommend heart healthy/Mediterranean diet, with whole grains, fruits, vegetable, fish, lean meats, nuts, and olive oil. Limit salt. -recommend moderate walking, 3-5 times/week for 30-50 minutes each session. Aim for at least 150 minutes.week. Goal should be pace of 3 miles/hours, or walking 1.5 miles in 30 minutes -recommend avoidance of tobacco products. Avoid excess alcohol. -ASCVD risk score: The 10-year ASCVD risk score (Arnett DK, et al., 2019) is: 22.3%   Values used to calculate the score:     Age: 42 years     Sex: Female     Is Non-Hispanic African American: Yes     Diabetic: Yes     Tobacco smoker: No     Systolic Blood Pressure: 125 mmHg     Is BP treated: Yes     HDL Cholesterol: 24 MG/DL     Total Cholesterol: 218 MG/DL    Dispo: Follow-up in 3 months, or sooner as needed.  I,Mathew Stumpf,acting as a Neurosurgeon for [employer], MD.,have documented all relevant documentation on the behalf of Jodelle Red, MD,as directed by  Jodelle Red, MD while in the presence of Jodelle Red, MD.  I,  Jodelle Red, MD, have reviewed all documentation for this visit. The documentation on 06/30/23 for the exam, diagnosis, procedures, and orders are all accurate and complete.   Signed, Jodelle Red, MD

## 2023-05-21 NOTE — Patient Instructions (Signed)
Medication Instructions:  No changes    *If you need a refill on your cardiac medications before your next appointment, please call your pharmacy*   Lab Work: None    If you have labs (blood work) drawn today and your tests are completely normal, you will receive your results only by: MyChart Message (if you have MyChart) OR A paper copy in the mail If you have any lab test that is abnormal or we need to change your treatment, we will call you to review the results.   Testing/Procedures: None    Follow-Up: At [facility], you and your health needs are our priority.  As part of our continuing mission to provide you with exceptional heart care, we have created designated Provider Care Teams.  These Care Teams include your primary Cardiologist (physician) and Advanced Practice Providers (APPs -  Physician Assistants and Nurse Practitioners) who all work together to provide you with the care you need, when you need it.  We recommend signing up for the patient portal called "MyChart".  Sign up information is provided on this After Visit Summary.  MyChart is used to connect with patients for Virtual Visits (Telemedicine).  Patients are able to view lab/test results, encounter notes, upcoming appointments, etc.  Non-urgent messages can be sent to your provider as well.   To learn more about what you can do with MyChart, go to [email or web address].    Your next appointment:   3 month(s)  The format for your next appointment:   In Person  Provider:   Jodelle Red, MD   Other Instructions

## 2023-05-23 ENCOUNTER — Other Ambulatory Visit (HOSPITAL_BASED_OUTPATIENT_CLINIC_OR_DEPARTMENT_OTHER): Payer: Self-pay | Admitting: Family Medicine

## 2023-05-24 ENCOUNTER — Other Ambulatory Visit: Payer: Self-pay

## 2023-05-24 ENCOUNTER — Other Ambulatory Visit (HOSPITAL_BASED_OUTPATIENT_CLINIC_OR_DEPARTMENT_OTHER): Payer: Self-pay

## 2023-05-24 MED ORDER — IRBESARTAN-HYDROCHLOROTHIAZIDE 300-12.5 MG PO TABS
1.0000 | ORAL_TABLET | Freq: Every day | ORAL | 1 refills | Status: DC
  Filled 2023-05-24: qty 90, 90d supply, fill #0
  Filled 2023-08-25: qty 90, 90d supply, fill #1

## 2023-05-24 MED ORDER — HYDROCHLOROTHIAZIDE 12.5 MG PO CAPS
12.5000 mg | ORAL_CAPSULE | Freq: Every day | ORAL | 1 refills | Status: DC
  Filled 2023-05-24: qty 90, 90d supply, fill #0
  Filled 2023-08-25: qty 90, 90d supply, fill #1

## 2023-05-24 MED ORDER — TIZANIDINE HCL 4 MG PO TABS
4.0000 mg | ORAL_TABLET | Freq: Three times a day (TID) | ORAL | 0 refills | Status: DC | PRN
  Filled 2023-05-24: qty 30, 10d supply, fill #0

## 2023-05-24 MED ORDER — NIFEDIPINE ER OSMOTIC RELEASE 60 MG PO TB24
60.0000 mg | ORAL_TABLET | Freq: Every day | ORAL | 1 refills | Status: DC
  Filled 2023-05-24: qty 90, 90d supply, fill #0
  Filled 2023-08-25: qty 90, 90d supply, fill #1

## 2023-05-27 ENCOUNTER — Encounter: Payer: No Typology Code available for payment source | Attending: Family Medicine | Admitting: Dietician

## 2023-05-27 DIAGNOSIS — E119 Type 2 diabetes mellitus without complications: Secondary | ICD-10-CM | POA: Insufficient documentation

## 2023-05-27 NOTE — Patient Instructions (Signed)
1-Aim to Avoid Skipping Meals   Plate planner 1/2 plate non starchy vegetables, 1/4 protein, 1/4 starchy foods or snack list in replace of a skipped meal

## 2023-05-27 NOTE — Progress Notes (Signed)
Diabetes Self-Management Education  Visit Type: First/Initial  Appt. Start Time: 1645  Appt. End Time: 1745   05/27/2023  Molly Archer, identified by name and date of birth, is a 42 y.o. female with a diagnosis of Diabetes: Type 2.   ASSESSMENT  Patient is here today alone. Patient would like to learn more about weight management. Patient lives with her mom. Pt does the shopping and they share cooking.  Pt reports her Sleep is poor ~ 3 hours most nights. Pt reports pain related to knee and hip pain and awaiting MRI results. Pt reports her Stress is 10 of 10. Pt reports making changes including decreased fried foods, omission of regular soda, smaller portion of rice, increase vegetables intake. Pt reports headaches after breakfast and feeling dizzy on occasion. Pt denies recent self monitoring of blood sugar and this was strongly encouraged.    History includes:   Past Medical History:  Diagnosis Date   Anemia    Asthma    only uses inhaler in fall and spring   Constipation    GERD (gastroesophageal reflux disease)    diet controlled, occ. uses omeprazole   Headache    otc med prn   History of kidney stones    Hx of Bell's palsy June 2013   Hypertension    currently no meds   Menometrorrhagia    Prediabetes    Psoriasis    Shortness of breath dyspnea    with exercise/exertion   Vitamin D deficiency     Labs noted:   Lab Results  Component Value Date   HGBA1C 7.7 (H) 03/04/2023    Medications include:   Current Outpatient Medications:    albuterol (PROVENTIL HFA;VENTOLIN HFA) 108 (90 Base) MCG/ACT inhaler, Inhale 2 puffs into the lungs every 4 (four) hours as needed for wheezing or shortness of breath (cough, shortness of breath or wheezing.)., Disp: 1 Inhaler, Rfl: 1   doxazosin (CARDURA) 1 MG tablet, Take 1 mg by mouth daily., Disp: , Rfl:    fluticasone (FLONASE) 50 MCG/ACT nasal spray, Administer 1 spray in each nostril 2 times daily., Disp: , Rfl:    glipiZIDE  (GLUCOTROL XL) 10 MG 24 hr tablet, Take 1 tablet (10 mg total) by mouth daily., Disp: 90 tablet, Rfl: 1   hydrochlorothiazide (MICROZIDE) 12.5 MG capsule, Take 1 capsule (12.5 mg total) by mouth daily., Disp: 90 capsule, Rfl: 1   ipratropium-albuterol (DUONEB) 0.5-2.5 (3) MG/3ML SOLN, Inhale into the lungs., Disp: , Rfl:    irbesartan-hydrochlorothiazide (AVALIDE) 300-12.5 MG tablet, Take 1 tablet by mouth daily., Disp: 90 tablet, Rfl: 1   NIFEdipine (PROCARDIA XL/NIFEDICAL XL) 60 MG 24 hr tablet, Take 1 tablet (60 mg total) by mouth daily., Disp: 90 tablet, Rfl: 1   norethindrone (AYGESTIN) 5 MG tablet, Take 3 tablets (15 mg total) by mouth daily., Disp: 180 tablet, Rfl: 5   oxyCODONE (ROXICODONE) 5 MG immediate release tablet, Take 1 tablet (5 mg total) by mouth every 12 (twelve) hours as needed for severe pain (pain score 7-10)., Disp: 60 tablet, Rfl: 0   tiZANidine (ZANAFLEX) 4 MG tablet, Take 1 tablet (4 mg total) by mouth every 8 (eight) hours as needed for muscle spasms., Disp: 30 tablet, Rfl: 0   zolpidem (AMBIEN) 5 MG tablet, Take 1 tablet (5 mg total) by mouth at bedtime as needed for sleep., Disp: 30 tablet, Rfl: 0   cetirizine (ZYRTEC) 10 MG tablet, Take by mouth. (Patient not taking: Reported on 05/27/2023), Disp: ,  Rfl:    Ciclopirox 1 % shampoo, Apply 1 each topically at bedtime. (Patient taking differently: Apply 1 each topically as needed (dry scalp).), Disp: 120 mL, Rfl: 0   clobetasol ointment (TEMOVATE) 0.05 %, Apply topically 2 (two) times daily., Disp: 30 g, Rfl: 1   fluocinolone 0.01 % cream, Apply twice a week., Disp: , Rfl:    Fluocinolone Acetonide Scalp 0.01 % OIL, Apply twice a week., Disp: , Rfl:    glucose blood (PRECISION QID TEST) test strip, Check glucose twice daily as needed., Disp: , Rfl:    HYDROcodone-acetaminophen (NORCO/VICODIN) 5-325 MG tablet, Take 1 tablet by mouth every 12 (twelve) hours as needed for moderate pain., Disp: 10 tablet, Rfl: 0   Naftifine HCl  (NAFTIN) 2 % CREA, Apply liberally to affected area daily, Disp: 60 g, Rfl: 1   triamcinolone cream (KENALOG) 0.1 %, Apply 1 application topically 2 (two) times daily., Disp: 30 g, Rfl: 0      There were no vitals taken for this visit. There is no height or weight on file to calculate BMI.   Diabetes Self-Management Education - 05/27/23 1640       Visit Information   Visit Type First/Initial      Initial Visit   Diabetes Type Type 2    Date Diagnosed ~1 year ago    Are you currently following a meal plan? No    Are you taking your medications as prescribed? Yes      Health Coping   How would you rate your overall health? Poor      Psychosocial Assessment   What is the hardest part about your diabetes right now, causing you the most concern, or is the most worrisome to you about your diabetes?   Being active;Getting support / problem solving    Self-care barriers None    Self-management support Doctor's office    Other persons present Patient    Patient Concerns Healthy Lifestyle;Weight Control;Nutrition/Meal planning    Special Needs None    Preferred Learning Style No preference indicated    Learning Readiness Change in progress    How often do you need to have someone help you when you read instructions, pamphlets, or other written materials from your doctor or pharmacy? 1 - Never    What is the last grade level you completed in school? college      Pre-Education Assessment   Patient understands the diabetes disease and treatment process. Needs Instruction    Patient understands incorporating nutritional management into lifestyle. Needs Instruction    Patient undertands incorporating physical activity into lifestyle. Needs Instruction    Patient understands using medications safely. Needs Instruction    Patient understands monitoring blood glucose, interpreting and using results Needs Instruction    Patient understands prevention, detection, and treatment of acute  complications. Needs Instruction    Patient understands prevention, detection, and treatment of chronic complications. Needs Instruction    Patient understands how to develop strategies to address psychosocial issues. Needs Instruction    Patient understands how to develop strategies to promote health/change behavior. Needs Instruction      Complications   Last HgB A1C per patient/outside source 7.7 %    How often do you check your blood sugar? 0 times/day (not testing)    Have you had a dilated eye exam in the past 12 months? Yes    Have you had a dental exam in the past 12 months? Yes    Are you checking  your feet? No      Dietary Intake   Breakfast yoplait strawberry yogurt,1/4 cup berries or 2-3 boiled eggs, 3 slices pork bacon or 2 veggie sausage, 1/3 cup of berries or cheerios, 2%    Lunch 6 ginger snaps, 2 string cheese or skips    Dinner baked chicken, green beans, potato salad, pasta salad, roll, meatballs, macaroni and cheese or skips    Beverage(s) water, 1/2 cup orange juice, water with lemon, cranberry juice      Activity / Exercise   Activity / Exercise Type ADL's   knee and hip pain     Patient Education   Previous Diabetes Education Yes (please comment)    Disease Pathophysiology Definition of diabetes, type 1 and 2, and the diagnosis of diabetes    Healthy Eating Plate Method;Meal options for control of blood glucose level and chronic complications.;Reviewed blood glucose goals for pre and post meals and how to evaluate the patients' food intake on their blood glucose level.    Being Active Role of exercise on diabetes management, blood pressure control and cardiac health.    Medications Reviewed patients medication for diabetes, action, purpose, timing of dose and side effects.    Monitoring Taught/evaluated SMBG meter.;Identified appropriate SMBG and/or A1C goals.    Acute complications Taught prevention, symptoms, and  treatment of hypoglycemia - the 15 rule.;Other  (comment)    Chronic complications Identified and discussed with patient  current chronic complications;Retinopathy and reason for yearly dilated eye exams;Relationship between chronic complications and blood glucose control    Diabetes Stress and Support Role of stress on diabetes    Preconception care Role of family planning for patients with diabetes    Lifestyle and Health Coping Helped patient develop diabetes management plan for (enter comment)      Individualized Goals (developed by patient)   Nutrition Follow meal plan discussed    Medications take my medication as prescribed    Monitoring  Test my blood glucose as discussed    Problem Solving Eating Pattern;Addressing barriers to behavior change;Sleep Pattern    Reducing Risk treat hypoglycemia with 15 grams of carbs if blood glucose less than 70mg /dL    Health Coping Ask for help with psychological, social, or emotional issues      Post-Education Assessment   Patient understands the diabetes disease and treatment process. Needs Review    Patient understands incorporating nutritional management into lifestyle. Needs Review    Patient undertands incorporating physical activity into lifestyle. Needs Review    Patient understands using medications safely. Needs Review    Patient understands monitoring blood glucose, interpreting and using results Needs Review    Patient understands prevention, detection, and treatment of acute complications. Needs Review    Patient understands prevention, detection, and treatment of chronic complications. Needs Review    Patient understands how to develop strategies to address psychosocial issues. Needs Review    Patient understands how to develop strategies to promote health/change behavior. Needs Review      Outcomes   Expected Outcomes Demonstrated interest in learning. Expect positive outcomes    Future DMSE 4-6 wks    Program Status Not Completed             Individualized Plan for  Diabetes Self-Management Training:   Learning Objective:  Patient will have a greater understanding of diabetes self-management. Patient education plan is to attend individual and/or group sessions per assessed needs and concerns.   Plan:   Patient Instructions  1-Aim  to Avoid Skipping Meals   Plate planner 1/2 plate non starchy vegetables, 1/4 protein, 1/4 starchy foods or snack list in replace of a skipped meal   Expected Outcomes:  Demonstrated interest in learning. Expect positive outcomes  Education material provided: ADA - How to Thrive: A Guide for Your Journey with Diabetes, My Plate, Snack sheet, and Carbohydrate counting sheet  If problems or questions, patient to contact team via:  Phone  Future DSME appointment: 4-6 wks

## 2023-05-29 ENCOUNTER — Encounter: Payer: No Typology Code available for payment source | Admitting: Physical Medicine & Rehabilitation

## 2023-05-29 ENCOUNTER — Other Ambulatory Visit (HOSPITAL_BASED_OUTPATIENT_CLINIC_OR_DEPARTMENT_OTHER): Payer: Self-pay

## 2023-05-29 ENCOUNTER — Encounter: Payer: Self-pay | Admitting: Physical Medicine & Rehabilitation

## 2023-05-29 VITALS — BP 127/74 | HR 93 | Ht 65.0 in | Wt 338.0 lb

## 2023-05-29 DIAGNOSIS — G894 Chronic pain syndrome: Secondary | ICD-10-CM | POA: Diagnosis not present

## 2023-05-29 DIAGNOSIS — M1711 Unilateral primary osteoarthritis, right knee: Secondary | ICD-10-CM

## 2023-05-29 DIAGNOSIS — M47816 Spondylosis without myelopathy or radiculopathy, lumbar region: Secondary | ICD-10-CM | POA: Diagnosis not present

## 2023-05-29 MED ORDER — OXYCODONE HCL 10 MG PO TABS
10.0000 mg | ORAL_TABLET | Freq: Three times a day (TID) | ORAL | 0 refills | Status: DC | PRN
Start: 2023-05-29 — End: 2023-07-09
  Filled 2023-05-29: qty 75, 25d supply, fill #0

## 2023-05-29 MED ORDER — DICLOFENAC SODIUM 75 MG PO TBEC
75.0000 mg | DELAYED_RELEASE_TABLET | Freq: Two times a day (BID) | ORAL | 3 refills | Status: DC
Start: 2023-05-29 — End: 2023-12-21
  Filled 2023-05-29: qty 60, 30d supply, fill #0
  Filled 2023-07-09: qty 60, 30d supply, fill #1
  Filled 2023-08-10: qty 60, 30d supply, fill #2
  Filled 2023-09-07 (×2): qty 60, 30d supply, fill #3

## 2023-05-29 NOTE — Progress Notes (Signed)
Subjective:    Patient ID: Molly Archer, female    DOB: 01/15/1981, 42 y.o.   MRN: [id number]  HPI  Mrs Goodlow is here in follow up of her chronic pain. Her pain is worse today despite starting on oxycodone last month. The pain seems worst in her right knee with radiation down to her foot. She describes it as burning/achy from her knee down to her foot. The pain is worse with standing, walking, stairs. Sitting used to help, but it's bothering her now at rest. She had an MRI done on 05/19/23 which was ordered by previous provider.  I looked at the imaging which was difficult to read as it appeared underpenetrated on my screen.  Disc spaces seem to be fairly well-preserved without any malalignment.  There may be some facet arthropathy.  I did not see any gross nerve root impingement.  We started her on some oxycodone at last visit as mentioned above but it really does not seem to be helping with her pain.  She has been on some NSAIDs at the past without a lot of benefit either.  Work has become difficult because of the distraction of her pain constantly.     Pain Inventory Average Pain 10 Pain Right Now 10 My pain is constant, burning, and aching  In the last 24 hours, has pain interfered with the following? General activity 10 Relation with others 10 Enjoyment of life 10 What TIME of day is your pain at its worst? morning , daytime, evening, and night Sleep (in general) Poor  Pain is worse with: walking, bending, sitting, inactivity, and standing Pain improves with:  . Relief from Meds: 0  Family History  Problem Relation Age of Onset   Hypertension Other    Diabetes Other    Cancer Other    Hypertension Mother    Hyperlipidemia Mother    Hypertension Father    Social History   Socioeconomic History   Marital status: Single    Spouse name: Not on file   Number of children: Not on file   Years of education: Not on file   Highest education level: Not on file   Occupational History   Not on file  Tobacco Use   Smoking status: Never   Smokeless tobacco: Never  Vaping Use   Vaping status: Never Used  Substance and Sexual Activity   Alcohol use: No   Drug use: No   Sexual activity: Not Currently    Birth control/protection: None  Other Topics Concern   Not on file  Social History Narrative   She is from [place], [place], moved here 1998.    Lives at home by herself.    Social Determinants of Health   Financial Resource Strain: Medium Risk (10/18/2021)   [facility] to [facility]29[facility] visits prior to 09/29/2022.   Overall Financial Resource Strain (CARDIA)    Difficulty of Paying Living Expenses: Somewhat hard  Food Insecurity: No Food Insecurity (05/27/2023)   Hunger Vital Sign    Worried About Running Out of Food in the Last Year: Never true    Ran Out of Food in the Last Year: Never true  Transportation Needs: Unmet Transportation Needs (04/03/2023)   Received from [employer]    In the past 12 months, has lack of reliable transportation kept you from medical appointments, meetings, work or from getting things needed  for daily living? : Yes  Physical Activity: Insufficiently Active (10/18/2021)   [facility] from [facility] visits prior to 09/29/2022[facility] visits prior to 09/29/2022.   Exercise Vital Sign    Days of Exercise per Week: 3 days    Minutes of Exercise per Session: 30 min  Stress: No Stress Concern Present (10/18/2021)   [facility] from [facility] visits prior to 09/29/2022[facility] visits prior to 09/29/2022.   [employer] of Occupational Health - Occupational Stress Questionnaire    Feeling of Stress : Not at all  Social Connections: Moderately  Integrated (10/18/2021)   [facility] visits prior to 09/29/2022., [facility] visits prior to 09/29/2022.   Social Connection and Isolation Panel [NHANES]    Frequency of Communication with Friends and Family: More than three times a week    Frequency of Social Gatherings with Friends and Family: Once a week    Attends Religious Services: More than 4 times per year    Active Member of Clubs or Organizations: Yes    Attends Engineer, structural: More than 4 times per year    Marital Status: Never married   Past Surgical History:  Procedure Laterality Date   COMBINED HYSTEROSCOPY DIAGNOSTIC / D&C N/A 2011   Benign secretory endometrium   CYSTOSCOPY WITH STENT PLACEMENT Left 10/19/2014   Procedure: CYSTOSCOPY WITH STENT PLACEMENT;  Surgeon: Crist Fat, MD;  Location: [facility];  Service: Urology;  Laterality: Left;   DILATION AND CURETTAGE OF UTERUS  10/2003   Polyp, mild atypia, simple and complex hyperplasia,   DILATION AND CURETTAGE OF UTERUS  11/2003   Simple and complex hyperplasia   DILATION AND CURETTAGE OF UTERUS  2011   DILATION AND CURETTAGE OF UTERUS N/A 12/14/2014   Procedure: DILATATION AND CURETTAGE;  Surgeon: Catalina Antigua, MD;  Location: [facility];  Service: Gynecology;  Laterality: N/A;   HOLMIUM LASER APPLICATION Left 10/19/2014   Procedure: HOLMIUM LASER APPLICATION;  Surgeon: Crist Fat, MD;  [facility];  Service: Urology;  Laterality: Left;   HYSTEROSCOPY WITH D & C N/A 07/21/2018   Procedure: DILATATION AND CURETTAGE /HYSTEROSCOPY;  Surgeon: Adam Phenix, MD;  [facility]: [facility];  Service: Gynecology;  Laterality: N/A;   INTRAUTERINE DEVICE (IUD) INSERTION N/A 12/14/2014   Procedure: INTRAUTERINE DEVICE (IUD) INSERTION;  Surgeon: Catalina Antigua, MD;  [facility];  Service: Gynecology;  Laterality: N/A;   STONE EXTRACTION WITH BASKET Left 10/19/2014   Procedure:  STONE EXTRACTION WITH BASKET;  Surgeon: Crist Fat, MD;  [facility];  Service: Urology;  Laterality: Left;   WISDOM TOOTH EXTRACTION     Past Surgical History:  Procedure Laterality Date   COMBINED HYSTEROSCOPY DIAGNOSTIC / D&C N/A 2011   Benign secretory endometrium   CYSTOSCOPY WITH STENT PLACEMENT Left 10/19/2014   Procedure: CYSTOSCOPY WITH STENT PLACEMENT;  Surgeon: Crist Fat, MD;  [facility];  Service: Urology;  Laterality: Left;   DILATION AND CURETTAGE OF UTERUS  10/2003   Polyp, mild atypia, simple and complex hyperplasia,   DILATION AND CURETTAGE OF UTERUS  11/2003   Simple and complex hyperplasia   DILATION AND CURETTAGE OF UTERUS  2011   DILATION AND CURETTAGE OF UTERUS N/A 12/14/2014   Procedure: DILATATION AND CURETTAGE;  Surgeon: Gigi Gin  Constant, MD;  [facility];  Service: Gynecology;  Laterality: N/A;   HOLMIUM LASER APPLICATION Left 10/19/2014   Procedure: HOLMIUM LASER APPLICATION;  Surgeon: Crist Fat, MD;  [facility];  Service: Urology;  Laterality: Left;   HYSTEROSCOPY WITH D & C N/A 07/21/2018   Procedure: DILATATION AND CURETTAGE /HYSTEROSCOPY;  Surgeon: Adam Phenix, MD;  [facility];  Service: Gynecology;  Laterality: N/A;   INTRAUTERINE DEVICE (IUD) INSERTION N/A 12/14/2014   Procedure: INTRAUTERINE DEVICE (IUD) INSERTION;  Surgeon: Catalina Antigua, MD;  [facility];  Service: Gynecology;  Laterality: N/A;   STONE EXTRACTION WITH BASKET Left 10/19/2014   Procedure: STONE EXTRACTION WITH BASKET;  Surgeon: Crist Fat, MD;  [facility];  Service: Urology;  Laterality: Left;   WISDOM TOOTH EXTRACTION     Past Medical History:  Diagnosis Date   Anemia    Asthma    only uses inhaler in fall and spring   Constipation    GERD (gastroesophageal reflux disease)    diet controlled, occ. uses omeprazole   Headache    otc med prn   History of kidney stones    Hx of Bell's palsy June 2013    Hypertension    currently no meds   Menometrorrhagia    Prediabetes    Psoriasis    Shortness of breath dyspnea    with exercise/exertion   Vitamin D deficiency    BP 127/74   Pulse 93   Ht 5\' 5"  (1.651 m)   Wt (!) 338 lb (153.3 kg)   SpO2 95%   BMI 56.25 kg/m   Opioid Risk Score:   Fall Risk Score:  `1  Depression screen [facility] 2/9     05/27/2023    4:36 PM 05/15/2023    9:59 AM 04/16/2023   11:12 AM 03/04/2023    3:20 PM 09/24/2018    9:12 AM 08/11/2018   10:21 AM 06/17/2018   10:46 AM  Depression screen PHQ 2/9  Decreased Interest 0 2 0 2 0 0 0  Down, Depressed, Hopeless 0 1 0 2 0 0 0  PHQ - 2 Score 0 3 0 4 0 0 0  Altered sleeping  3 0 2 2 0 1  Tired, decreased energy  2 0 2 2 1 2   Change in appetite  2 0 2 1 1  0  Feeling bad or failure about yourself   1 0 1 0 0 0  Trouble concentrating  1 0 2 0 0 0  Moving slowly or fidgety/restless  1 0 2 0 0 0  Suicidal thoughts  0 0 0 0 0 0  PHQ-9 Score  13 0 15 5 2 3   Difficult doing work/chores  Very difficult    Not difficult at all       Review of Systems  Musculoskeletal:        Right leg pain  All other systems reviewed and are negative.     Objective:   Physical Exam General: No acute distress HEENT: NCAT, EOMI, oral membranes moist Cards: reg rate  Chest: normal effort Abdomen: Soft, NT, ND Skin: dry, intact Extremities: no edema Psych: pleasant and appropriate  Skin: intact Neuro: Alert and oriented x 3. Normal insight and awareness. Intact Memory. Normal language and speech. Cranial nerve exam unremarkable. MMT: 5/5 in all 4's. Sensory exam normal for light touch and pain in all 4 limbs. No limb ataxia or cerebellar signs. No abnormal tone appreciated.  Marland Kitchen  Musculoskeletal: Patient with generalized tenderness of her right knee.  There appears to be a slight effusion as well.  There is tenderness along the inferior medial borders of the patella as well as the medial joint line in particular.  Meniscal  maneuvers were positive for the medial meniscus today.  Lateral meniscus was equivocal to negative.  Mild crepitus was appreciated.  Patient had a negative to equivocal straight leg raise.  Hamstring still are tight.  She is antalgic with weightbearing on the right lower extremity.           Assessment & Plan:    Chronic pain syndrome with chronic low back pain, bilateral OA of knees.  She has had appropriate treatment thus far including epidural injections, therapy, numerous muscle relaxants NSAIDs and medications for neuropathic pain.  I suspect her right leg pain is more related to knee then back.  Morbid obesity.  I do feel that this is a major driver for her lower extremity pain, particularly the knees.  She has made some efforts in the past to lose her weight but states that she has always been heavy.  She does see nutritionist next week.       Plan: MRI of lumbar spine reviewed. Didn't see any major disc pathology or malalignment. Films were difficult to read however (dim) Have discussed weight loss. MRI of right knee without contrast Low impact exercising as possible UDS today Increase oxycodone to 10mg  every 8 hours as needed #75 Continue controlled substance monitoring program, this consists of regular clinic visits, examinations, routine drug screening, pill counts as well as use of [place] Controlled Substance Reporting System. [place] today.    7.  Added diclofenac 75 mg p.o. twice daily with food     20+minutes of face to face patient care time were spent during this visit. All questions were encouraged and answered. Follow up with me in 6 weeks

## 2023-05-29 NOTE — Patient Instructions (Signed)
ALWAYS FEEL FREE TO CALL OUR OFFICE WITH ANY PROBLEMS OR QUESTIONS ([phone])  **PLEASE NOTE** ALL MEDICATION REFILL REQUESTS (INCLUDING CONTROLLED SUBSTANCES) NEED TO BE MADE AT LEAST 7 DAYS PRIOR TO REFILL BEING DUE. ANY REFILL REQUESTS INSIDE THAT TIME FRAME MAY RESULT IN DELAYS IN RECEIVING YOUR PRESCRIPTION.                    

## 2023-06-03 ENCOUNTER — Ambulatory Visit (INDEPENDENT_AMBULATORY_CARE_PROVIDER_SITE_OTHER): Payer: No Typology Code available for payment source | Admitting: Licensed Clinical Social Worker

## 2023-06-03 DIAGNOSIS — F4322 Adjustment disorder with anxiety: Secondary | ICD-10-CM | POA: Diagnosis not present

## 2023-06-03 NOTE — Progress Notes (Signed)
[facility] Counselor/Therapist Progress Note  Patient ID: Molly Archer, MRN: [id number]    Date: 06/03/23  Time Spent: 0302  pm - 0400 pm : 58 Minutes  Treatment Type: Individual Therapy.  Reported Symptoms: Symptoms of anxiety due to life changes and health.  Mental Status Exam: Appearance:  Neat     Behavior: Appropriate  Motor: Normal  Speech/Language:  Clear and Coherent  Affect: Appropriate  Mood: normal  Thought process: normal  Thought content:   WNL  Sensory/Perceptual disturbances:   WNL  Orientation: oriented to person, place, time/date, situation, day of week, month of year, and year  Attention: Good  Concentration: Good  Memory: WNL  Fund of knowledge:  Good  Insight:   Good  Judgment:  Good  Impulse Control: Good   Risk Assessment: Danger to Self:  No Self-injurious Behavior: No Danger to Others: No Duty to Warn:no Physical Aggression / Violence:No  Access to Firearms a concern: No  Gang Involvement:No   Subjective:   Molly Archer participated from office, located at [employer] with Clinician present. Molly Archer consented to treatment.   Molly Archer presented for her session reporting that she returned to work the previous week. Patient states she has been busy catching up with work details. Patient reports still having pain and seeing her doctor for a change in her medication regimen.  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 Patient was deeply hurt and had been involved with the ministry for 10 years. Patient verbalized that it negatively impacted her and her trust in others. Patient reports that she feels right now she just wants to worship and grow close to in her faith as she previously felt she was. 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 Patient shared that she works and goes home. Clinician encouraged patient to find an outlet whether it be with a friend group or community/church service to get involved in and utilize her talents and gifts. We discussed how this can improve overall mental wellness.  Interventions: Cognitive Behavioral Therapy and Solution-Oriented/Positive Psychology  Diagnosis: Adjustment Disorder with anxious mood   Plan: [name]  is to use CBT, mindfulness and coping skills to help manage decrease symptoms associated with their diagnosis.   Long-term goal:   Molly Archer will reduce overall level, frequency, and intensity of the feelings of anxiety evidenced by decreased irritability, negative self talk, and helpless feelings from 6 to 7 days/week to 0 to 2 days/week per client report for at least 3 consecutive months. Treatment plan to be reviewed by 05/15/2024.  Short-term goal:  Molly Archer will verbally express understanding of the relationship between feelings of anxiety and their impact on thinking patterns and behaviors. Verbalize an understanding of the role that distorted thinking plays in creating fears, excessive worry, and ruminations.  Molly Archer MSW, LCSW DATE: 06/03/2023

## 2023-06-06 ENCOUNTER — Ambulatory Visit (HOSPITAL_COMMUNITY)
Admission: RE | Admit: 2023-06-06 | Discharge: 2023-06-06 | Disposition: A | Discharge status: Home / Self Care | Payer: No Typology Code available for payment source | Source: Ambulatory Visit | Attending: Physical Medicine & Rehabilitation | Admitting: Physical Medicine & Rehabilitation

## 2023-06-06 DIAGNOSIS — M1711 Unilateral primary osteoarthritis, right knee: Secondary | ICD-10-CM | POA: Insufficient documentation

## 2023-06-06 DIAGNOSIS — M47816 Spondylosis without myelopathy or radiculopathy, lumbar region: Secondary | ICD-10-CM | POA: Insufficient documentation

## 2023-06-12 ENCOUNTER — Telehealth: Payer: Self-pay | Admitting: Physical Medicine and Rehabilitation

## 2023-06-12 NOTE — Telephone Encounter (Signed)
I spoke with patient this afternoon, recent lumbar MRI imaging is unchanged compared to 2023 imaging. There is right foraminal disc protrusion at L4-L5 contacting right L4 nerve root. Minimal relief with both interlaminar and transforaminal epidural steroid injections. I think her best option is to continue chronic pain management with Dr. Riley Kill (PM&R). I am unsure she would be ideal surgical candidate due to high BMI, if she wanted to pursue surgical route I am happy to talk with Dr. Christell Constant to see if he thinks surgery is an option. I advised her to let us know if she needs anything.

## 2023-06-17 ENCOUNTER — Ambulatory Visit (INDEPENDENT_AMBULATORY_CARE_PROVIDER_SITE_OTHER): Payer: No Typology Code available for payment source | Admitting: Licensed Clinical Social Worker

## 2023-06-17 ENCOUNTER — Other Ambulatory Visit (HOSPITAL_BASED_OUTPATIENT_CLINIC_OR_DEPARTMENT_OTHER): Payer: Self-pay

## 2023-06-17 ENCOUNTER — Other Ambulatory Visit (HOSPITAL_BASED_OUTPATIENT_CLINIC_OR_DEPARTMENT_OTHER): Payer: Self-pay | Admitting: Family Medicine

## 2023-06-17 DIAGNOSIS — F4322 Adjustment disorder with anxiety: Secondary | ICD-10-CM | POA: Diagnosis not present

## 2023-06-17 NOTE — Progress Notes (Signed)
[facility] Counselor/Therapist Progress Note  Patient ID: Molly Archer, MRN: [id number]    Date: 06/17/23  Time Spent: 0402  pm - 0458 pm : 56 Minutes  Treatment Type: Individual Therapy.  Reported Symptoms: Anxiety and stress  Mental Status Exam: Appearance:  Casual     Behavior: Appropriate  Motor: Normal  Speech/Language:  Clear and Coherent  Affect: Appropriate  Mood: normal  Thought process: normal  Thought content:   WNL  Sensory/Perceptual disturbances:   WNL  Orientation: oriented to person, place, time/date, situation, day of week, month of year, and year  Attention: Good  Concentration: Good  Memory: WNL  Fund of knowledge:  Good  Insight:   Good  Judgment:  Good  Impulse Control: Good   Risk Assessment: Danger to Self:  No Self-injurious Behavior: No Danger to Others: No Duty to Warn:no Physical Aggression / Violence:No  Access to Firearms a concern: No  Gang Involvement:No   Subjective:   Molly Archer participated from office, located at [employer] with Clinician present. Molly Archer consented to treatment.   Molly Archer presented for her session and identified that she had a rough day at work. Patient shared that she has had a stressful weekend due to her mother having some financial issues. Patient shared that she was brokenhearted to see her mother in a panic. Patient reported that she can see where it took her back to her childhood when her mother struggled to make ends meet as a single parent. Patient shared that she often feels some kind of way toward her father and how he let them struggle and still he has a good life and she and her mother struggle. Patient reported that she and her mother would never ask for a handout and always mad it work. Patient shared her goal fo she and her mother planning to purchase a home together. Patient reported that she often gets anxious about finances and what it takes to survive financially.  Clinician and patient processed the importance of utilizing resources and know the resources that are available in times of hardship.  Interventions: Cognitive Behavioral Therapy  Diagnosis: Adjustment Disorder with anxious mood   Plan: Molly Archer is to use CBT, mindfulness and coping skills to help manage decrease symptoms associated with their diagnosis.   Long-term goal:   Wm will reduce overall level, frequency, and intensity of the feelings of anxiety and panic evidenced by       decreased irritability, negative self talk, and helpless feelings from 6 to 7 days/week to 0 to 2 days/week per client report for at least 3 consecutive months. Treatment plan to be reviewed by 05/15/2024.  Short-term goal:  Molly Archer will verbally express understanding of the relationship between feelings of  anxiety and their impact on thinking patterns and behaviors. Verbalize an understanding of the role that distorted thinking plays in creating fears, excessive worry, and ruminations.  Molly Archer MSW, LCSW DATE: 06/17/2023

## 2023-06-18 ENCOUNTER — Other Ambulatory Visit (HOSPITAL_BASED_OUTPATIENT_CLINIC_OR_DEPARTMENT_OTHER): Payer: Self-pay

## 2023-06-18 ENCOUNTER — Encounter (HOSPITAL_BASED_OUTPATIENT_CLINIC_OR_DEPARTMENT_OTHER): Payer: Self-pay | Admitting: *Deleted

## 2023-06-18 ENCOUNTER — Ambulatory Visit (HOSPITAL_BASED_OUTPATIENT_CLINIC_OR_DEPARTMENT_OTHER): Payer: No Typology Code available for payment source | Admitting: Family Medicine

## 2023-06-18 ENCOUNTER — Other Ambulatory Visit: Payer: Self-pay

## 2023-06-18 VITALS — BP 165/91 | HR 94 | Ht 65.0 in | Wt 337.4 lb

## 2023-06-18 DIAGNOSIS — E1165 Type 2 diabetes mellitus with hyperglycemia: Secondary | ICD-10-CM | POA: Diagnosis not present

## 2023-06-18 DIAGNOSIS — J020 Streptococcal pharyngitis: Secondary | ICD-10-CM | POA: Insufficient documentation

## 2023-06-18 DIAGNOSIS — J029 Acute pharyngitis, unspecified: Secondary | ICD-10-CM | POA: Diagnosis not present

## 2023-06-18 LAB — POCT RAPID STREP A (OFFICE): Rapid Strep A Screen: POSITIVE — AB

## 2023-06-18 LAB — POC COVID19 BINAXNOW: SARS Coronavirus 2 Ag: NEGATIVE

## 2023-06-18 MED ORDER — TIRZEPATIDE 2.5 MG/0.5ML ~~LOC~~ SOAJ
2.5000 mg | SUBCUTANEOUS | 1 refills | Status: DC
Start: 2023-06-18 — End: 2023-08-26
  Filled 2023-06-18: qty 2, 28d supply, fill #0
  Filled 2023-08-04: qty 2, 28d supply, fill #1

## 2023-06-18 MED ORDER — CICLOPIROX 1 % EX SHAM
1.0000 | MEDICATED_SHAMPOO | Freq: Every day | CUTANEOUS | 0 refills | Status: DC
Start: 2023-06-18 — End: 2024-04-02
  Filled 2023-06-18: qty 120, 30d supply, fill #0

## 2023-06-18 MED ORDER — CEFDINIR 300 MG PO CAPS
300.0000 mg | ORAL_CAPSULE | Freq: Two times a day (BID) | ORAL | 0 refills | Status: DC
  Filled 2023-06-18: qty 14, 7d supply, fill #0

## 2023-06-18 NOTE — Progress Notes (Signed)
    Procedures performed today:    None.  Independent interpretation of notes and tests performed by another provider:   None.  Brief History, Exam, Impression, and Recommendations:    BP (!) 165/91 (BP Location: Right Arm, Patient Position: Sitting, Cuff Size: Normal)   Pulse 94   Ht 5\' 5"  (1.651 m)   Wt (!) 337 lb 6.4 oz (153 kg)   SpO2 99%   BMI 56.15 kg/m   Sore throat -     POCT rapid strep A -     POC COVID-19 BinaxNow  Type 2 diabetes mellitus with hyperglycemia, without long-term current use of insulin (HCC) Assessment & Plan: Patient has a questions today about GLP-1 receptor agonist.  We did discuss these in depth previously.  She has had some hesitancy related to injectable nature of medication.  We again reviewed side effects, potential adverse reactions.  She would like to proceed with initiating GLP-1 receptor agonist, we will look to start Mounjaro if approved by insurance and if not cost prohibitive.  If she is able to obtain medication, she will contact the office to schedule nurse visit for assistance with first injection.  Will plan for follow-up in about 1 month at previously scheduled appointment to monitor progress with new medication.  Did discuss that likely we can look to stop glipizide as we titrate dose of injectable medication.  For [facility], would likely discontinue glipizide once transitioning to 7.5 mg dose at [facility] or if any hypoglycemic episodes are noted prior to this  Orders: -     Tirzepatide; Inject 2.5 mg into the skin once a week.  Dispense: 2 mL; Refill: 1  Strep throat Assessment & Plan: Patient reports that she began to have sore throat yesterday, also has some sinus congestion.  No cough, fever, chills, sweats.  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  Not aware of any specific illnesses or infections.  Denies any trouble with breathing, no shortness of breath. On exam, patient is in no acute  distress, vital signs stable.  Lungs clear to auscultation bilaterally.  Moderate pharyngeal erythema.  Due to body habitus, difficult for complete evaluation of anterior cervical lymph node chain. Plan of care COVID testing and strep swab completed in office today.  Strep swab did return positive.  We will proceed with antibiotic therapy.  Patient does report history of penicillin allergy, reported hives.  Can proceed with cefdinir at this time, instructed on proper use.  Can utilize conservative measures to help with control of symptoms.  Note provided today for work.   Other orders -     Cefdinir; Take 1 capsule (300 mg total) by mouth 2 (two) times daily.  Dispense: 14 capsule; Refill: 0   ___________________________________________ Guinn Delarosa de [place], MD, ABFM, [facility]

## 2023-06-18 NOTE — Assessment & Plan Note (Signed)
Patient has a questions today about GLP-1 receptor agonist.  We did discuss these in depth previously.  She has had some hesitancy related to injectable nature of medication.  We again reviewed side effects, potential adverse reactions.  She would like to proceed with initiating GLP-1 receptor agonist, we will look to start Mounjaro if approved by insurance and if not cost prohibitive.  If she is able to obtain medication, she will contact the office to schedule nurse visit for assistance with first injection.  Will plan for follow-up in about 1 month at previously scheduled appointment to monitor progress with new medication.  Did discuss that likely we can look to stop glipizide as we titrate dose of injectable medication.  For [facility], would likely discontinue glipizide once transitioning to 7.5 mg dose at [facility] or if any hypoglycemic episodes are noted prior to this

## 2023-06-18 NOTE — Assessment & Plan Note (Signed)
Patient reports that she began to have sore throat yesterday, also has some sinus congestion.  No cough, fever, chills, sweats.  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  Not aware of any specific illnesses or infections.  Denies any trouble with breathing, no shortness of breath. On exam, patient is in no acute distress, vital signs stable.  Lungs clear to auscultation bilaterally.  Moderate pharyngeal erythema.  Due to body habitus, difficult for complete evaluation of anterior cervical lymph node chain. Plan of care COVID testing and strep swab completed in office today.  Strep swab did return positive.  We will proceed with antibiotic therapy.  Patient does report history of penicillin allergy, reported hives.  Can proceed with cefdinir at this time, instructed on proper use.  Can utilize conservative measures to help with control of symptoms.  Note provided today for work.

## 2023-06-19 ENCOUNTER — Encounter (HOSPITAL_BASED_OUTPATIENT_CLINIC_OR_DEPARTMENT_OTHER): Payer: Self-pay | Admitting: Family Medicine

## 2023-06-20 ENCOUNTER — Encounter (HOSPITAL_BASED_OUTPATIENT_CLINIC_OR_DEPARTMENT_OTHER): Payer: Self-pay | Admitting: *Deleted

## 2023-06-22 ENCOUNTER — Other Ambulatory Visit (HOSPITAL_BASED_OUTPATIENT_CLINIC_OR_DEPARTMENT_OTHER): Payer: Self-pay

## 2023-06-24 ENCOUNTER — Other Ambulatory Visit (HOSPITAL_BASED_OUTPATIENT_CLINIC_OR_DEPARTMENT_OTHER): Payer: Self-pay

## 2023-06-24 NOTE — Telephone Encounter (Signed)
Called pt still no better now throwing up made appt 11/26 to see dr Tommi Rumps [place]

## 2023-06-25 ENCOUNTER — Ambulatory Visit (HOSPITAL_BASED_OUTPATIENT_CLINIC_OR_DEPARTMENT_OTHER): Payer: No Typology Code available for payment source | Admitting: Family Medicine

## 2023-06-25 ENCOUNTER — Other Ambulatory Visit (HOSPITAL_BASED_OUTPATIENT_CLINIC_OR_DEPARTMENT_OTHER): Payer: Self-pay

## 2023-06-25 ENCOUNTER — Encounter (HOSPITAL_BASED_OUTPATIENT_CLINIC_OR_DEPARTMENT_OTHER): Payer: Self-pay | Admitting: Family Medicine

## 2023-06-25 VITALS — BP 125/67 | HR 89 | Temp 98.4°F | Ht 65.0 in | Wt 342.3 lb

## 2023-06-25 DIAGNOSIS — R051 Acute cough: Secondary | ICD-10-CM | POA: Insufficient documentation

## 2023-06-25 DIAGNOSIS — F5101 Primary insomnia: Secondary | ICD-10-CM | POA: Diagnosis not present

## 2023-06-25 LAB — POC COVID19 BINAXNOW: SARS Coronavirus 2 Ag: NEGATIVE

## 2023-06-25 LAB — POCT INFLUENZA A/B
Influenza A, POC: NEGATIVE
Influenza B, POC: NEGATIVE

## 2023-06-25 MED ORDER — PROMETHAZINE-DM 6.25-15 MG/5ML PO SYRP
5.0000 mL | ORAL_SOLUTION | Freq: Four times a day (QID) | ORAL | 0 refills | Status: DC | PRN
  Filled 2023-06-25: qty 118, 6d supply, fill #0

## 2023-06-25 MED ORDER — ZOLPIDEM TARTRATE 5 MG PO TABS
5.0000 mg | ORAL_TABLET | Freq: Every evening | ORAL | 0 refills | Status: DC | PRN
Start: 2023-06-25 — End: 2023-08-10
  Filled 2023-06-25: qty 30, 60d supply, fill #0

## 2023-06-25 NOTE — Progress Notes (Unsigned)
    Procedures performed today:    None.  Independent interpretation of notes and tests performed by another provider:   None.  Brief History, Exam, Impression, and Recommendations:    BP 125/67 (BP Location: Right Arm, Patient Position: Sitting, Cuff Size: Large)   Pulse 89   Temp 98.4 F (36.9 C)   Ht 5\' 5"  (1.651 m)   Wt (!) 342 lb 4.8 oz (155.3 kg)   SpO2 98%   BMI 56.96 kg/m   Acute cough Assessment & Plan: Patient presents for evaluation of symptoms including cough, fatigue, sinus congestion.  She also had an episode of vomiting yesterday.  She was recently seen for sore throat with diagnosis of strep throat at a time.  She did start on antibiotics as prescribed.  Throat is no longer sore, however new symptoms as above have started the patient.  The symptoms began about 4 to 5 days ago.  She has not had any fever or chills, sweats she is not necessarily aware of any sick contacts, however does relate this and is around children who will be ill from time to time.  She did have 1 episode of vomiting, has not had any persistent symptoms with this, no reported nausea. On exam, patient is in no acute distress, vital signs stable.  Cardiovascular exam with regular rate and rhythm, lungs clear to auscultation bilaterally. Given recent history and diagnosis with strep throat, progression of symptoms with new symptoms including cough, rhinorrhea/congestion, seeing to indicate alternative etiology.  Would not be expecting cough with strep throat and I do feel that ultimately patient had diagnosis of strep throat, treated appropriately with antibiotics and subsequently developed new infection, likely of viral etiology.  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  Patient agreed to proceed with this. COVID and flu swabs both negative which is reassuring.  Recommend  treating as acute viral URI with primary focus on symptomatic management.  Discussed options in this regard, can proceed with prescription for cough syrup as below to assist with control of symptoms.  Discussed precautions, particular related to any worsening of symptoms or if noticing any difficulty breathing or shortness of breath, to return to office or present to emergency department for further evaluation  Orders: -     POCT Influenza A/B -     POC COVID-19 BinaxNow  Primary insomnia Assessment & Plan: Historically has been managing with Ambien as needed.  She is requesting refill of his medication at this time.  PDMP reviewed, no red flags.   Other orders -     Promethazine-DM; Take 5 mLs by mouth 4 (four) times daily as needed for cough.  Dispense: 118 mL; Refill: 0 -     Zolpidem Tartrate; Take 1 tablet (5 mg total) by mouth at bedtime as needed for sleep.  Dispense: 30 tablet; Refill: 0   ___________________________________________ Trea Latner de [place], MD, ABFM, [facility] Primary Care and Sports Medicine [facility]

## 2023-06-26 NOTE — Assessment & Plan Note (Signed)
Historically has been managing with Ambien as needed.  She is requesting refill of his medication at this time.  PDMP reviewed, no red flags.

## 2023-06-26 NOTE — Assessment & Plan Note (Signed)
Patient presents for evaluation of symptoms including cough, fatigue, sinus congestion.  She also had an episode of vomiting yesterday.  She was recently seen for sore throat with diagnosis of strep throat at a time.  She did start on antibiotics as prescribed.  Throat is no longer sore, however new symptoms as above have started the patient.  The symptoms began about 4 to 5 days ago.  She has not had any fever or chills, sweats she is not necessarily aware of any sick contacts, however does relate this and is around children who will be ill from time to time.  She did have 1 episode of vomiting, has not had any persistent symptoms with this, no reported nausea. On exam, patient is in no acute distress, vital signs stable.  Cardiovascular exam with regular rate and rhythm, lungs clear to auscultation bilaterally. Given recent history and diagnosis with strep throat, progression of symptoms with new symptoms including cough, rhinorrhea/congestion, seeing to indicate alternative etiology.  Would not be expecting cough with strep throat and I do feel that ultimately patient had diagnosis of strep throat, treated appropriately with antibiotics and subsequently developed new infection, likely of viral etiology.  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  Patient agreed to proceed with this. COVID and flu swabs both negative which is reassuring.  Recommend treating as acute viral URI with primary focus on symptomatic management.  Discussed options in this regard, can proceed with prescription for cough syrup as below to assist with control of symptoms.  Discussed precautions, particular related to any worsening of symptoms or if noticing any difficulty breathing or shortness of breath, to return to office or present to emergency department for further evaluation

## 2023-06-28 ENCOUNTER — Other Ambulatory Visit: Payer: Self-pay

## 2023-06-28 ENCOUNTER — Other Ambulatory Visit (HOSPITAL_BASED_OUTPATIENT_CLINIC_OR_DEPARTMENT_OTHER): Payer: Self-pay

## 2023-06-30 ENCOUNTER — Encounter (HOSPITAL_BASED_OUTPATIENT_CLINIC_OR_DEPARTMENT_OTHER): Payer: Self-pay | Admitting: Cardiology

## 2023-07-01 ENCOUNTER — Telehealth (HOSPITAL_BASED_OUTPATIENT_CLINIC_OR_DEPARTMENT_OTHER): Payer: Self-pay | Admitting: *Deleted

## 2023-07-01 ENCOUNTER — Other Ambulatory Visit: Payer: Self-pay

## 2023-07-01 ENCOUNTER — Other Ambulatory Visit (HOSPITAL_BASED_OUTPATIENT_CLINIC_OR_DEPARTMENT_OTHER): Payer: Self-pay

## 2023-07-01 NOTE — Telephone Encounter (Signed)
Received prior auth for mounjaro. Tried to fill out prior [place] but according to insurance member has been termed.

## 2023-07-02 ENCOUNTER — Other Ambulatory Visit (HOSPITAL_BASED_OUTPATIENT_CLINIC_OR_DEPARTMENT_OTHER): Payer: Self-pay

## 2023-07-03 ENCOUNTER — Ambulatory Visit: Payer: No Typology Code available for payment source | Admitting: Physical Medicine & Rehabilitation

## 2023-07-06 ENCOUNTER — Other Ambulatory Visit (HOSPITAL_BASED_OUTPATIENT_CLINIC_OR_DEPARTMENT_OTHER): Payer: Self-pay

## 2023-07-09 ENCOUNTER — Other Ambulatory Visit: Payer: Self-pay

## 2023-07-09 ENCOUNTER — Other Ambulatory Visit: Payer: Self-pay | Admitting: Physical Medicine & Rehabilitation

## 2023-07-09 ENCOUNTER — Other Ambulatory Visit (HOSPITAL_BASED_OUTPATIENT_CLINIC_OR_DEPARTMENT_OTHER): Payer: Self-pay

## 2023-07-09 ENCOUNTER — Other Ambulatory Visit (HOSPITAL_BASED_OUTPATIENT_CLINIC_OR_DEPARTMENT_OTHER): Payer: Self-pay | Admitting: Family Medicine

## 2023-07-09 DIAGNOSIS — M1711 Unilateral primary osteoarthritis, right knee: Secondary | ICD-10-CM

## 2023-07-09 DIAGNOSIS — M47816 Spondylosis without myelopathy or radiculopathy, lumbar region: Secondary | ICD-10-CM

## 2023-07-09 MED ORDER — IPRATROPIUM-ALBUTEROL 0.5-2.5 (3) MG/3ML IN SOLN
3.0000 mL | Freq: Four times a day (QID) | RESPIRATORY_TRACT | 5 refills | Status: DC | PRN
  Filled 2023-07-09: qty 360, 30d supply, fill #0

## 2023-07-10 ENCOUNTER — Ambulatory Visit: Payer: No Typology Code available for payment source | Admitting: Licensed Clinical Social Worker

## 2023-07-10 ENCOUNTER — Other Ambulatory Visit (HOSPITAL_BASED_OUTPATIENT_CLINIC_OR_DEPARTMENT_OTHER): Payer: Self-pay

## 2023-07-10 ENCOUNTER — Other Ambulatory Visit (HOSPITAL_BASED_OUTPATIENT_CLINIC_OR_DEPARTMENT_OTHER): Payer: Self-pay | Admitting: Family Medicine

## 2023-07-10 MED ORDER — TIZANIDINE HCL 4 MG PO TABS
4.0000 mg | ORAL_TABLET | Freq: Three times a day (TID) | ORAL | 0 refills | Status: DC | PRN
  Filled 2023-07-10: qty 30, 10d supply, fill #0

## 2023-07-12 ENCOUNTER — Other Ambulatory Visit (HOSPITAL_BASED_OUTPATIENT_CLINIC_OR_DEPARTMENT_OTHER): Payer: Self-pay

## 2023-07-15 ENCOUNTER — Other Ambulatory Visit (HOSPITAL_BASED_OUTPATIENT_CLINIC_OR_DEPARTMENT_OTHER): Payer: Self-pay

## 2023-07-15 MED ORDER — OXYCODONE HCL 10 MG PO TABS
10.0000 mg | ORAL_TABLET | Freq: Three times a day (TID) | ORAL | 0 refills | Status: DC | PRN
  Filled 2023-07-15: qty 75, 25d supply, fill #0

## 2023-07-16 ENCOUNTER — Other Ambulatory Visit: Payer: Self-pay

## 2023-07-16 ENCOUNTER — Other Ambulatory Visit (HOSPITAL_BASED_OUTPATIENT_CLINIC_OR_DEPARTMENT_OTHER): Payer: Self-pay

## 2023-07-16 ENCOUNTER — Encounter (HOSPITAL_BASED_OUTPATIENT_CLINIC_OR_DEPARTMENT_OTHER): Payer: Self-pay | Admitting: Family Medicine

## 2023-07-16 ENCOUNTER — Ambulatory Visit (INDEPENDENT_AMBULATORY_CARE_PROVIDER_SITE_OTHER): Payer: No Typology Code available for payment source | Admitting: Family Medicine

## 2023-07-16 DIAGNOSIS — E1165 Type 2 diabetes mellitus with hyperglycemia: Secondary | ICD-10-CM | POA: Diagnosis not present

## 2023-07-16 LAB — POCT GLYCOSYLATED HEMOGLOBIN (HGB A1C): HbA1c POC (<> result, manual entry): 7.9 % (ref 4.0–5.6)

## 2023-07-16 NOTE — Progress Notes (Signed)
    Procedures performed today:    None.  Independent interpretation of notes and tests performed by another provider:   None.  Brief History, Exam, Impression, and Recommendations:    BP (!) 120/53 (BP Location: Right Arm, Patient Position: Sitting, Cuff Size: Normal)   Pulse 90   Ht 5\' 5"  (1.651 m)   Wt (!) 347 lb 6.4 oz (157.6 kg)   SpO2 100%   BMI 57.81 kg/m   Type 2 diabetes mellitus with hyperglycemia, without long-term current use of insulin (HCC) Assessment & Plan: Patient has Mounjaro with her here today.  She does continue with glipizide, denies any issues with medication.  She did bring Mounjaro in order to have assistance with personal injection here in the office today.  Last hemoglobin A1c was normal 4 months ago, was above goal at 7.7 at that time. Can proceed with initiation of Mounjaro with plan for gradual titration, discussed potential risk/side effects associated with medication and typical titration timeline.  Can continue with glipizide for now and we will look to stop this medication as we do titrate dose of Mounjaro. We will check hemoglobin A1c today. Will need to check BMP in the near future  Orders: -     Microalbumin / creatinine urine ratio -     POCT glycosylated hemoglobin (Hb A1C)  Return in about 4 weeks (around 08/13/2023) for diabetes, med check.   ___________________________________________ Amarisa Wilinski de [place], MD, ABFM, CA[facility] Primary Care and Sports Medicine [facility]

## 2023-07-16 NOTE — Assessment & Plan Note (Signed)
Patient has Mounjaro with her here today.  She does continue with glipizide, denies any issues with medication.  She did bring Mounjaro in order to have assistance with personal injection here in the office today.  Last hemoglobin A1c was normal 4 months ago, was above goal at 7.7 at that time. Can proceed with initiation of Mounjaro with plan for gradual titration, discussed potential risk/side effects associated with medication and typical titration timeline.  Can continue with glipizide for now and we will look to stop this medication as we do titrate dose of Mounjaro. We will check hemoglobin A1c today. Will need to check BMP in the near future

## 2023-07-16 NOTE — Patient Instructions (Signed)
  Medication Instructions:  Your physician recommends that you continue on your current medications as directed. Please refer to the Current Medication list given to you today. --If you need a refill on any your medications before your next appointment, please call your pharmacy first. If no refills are authorized on file call the office.--   Follow-Up: Your next appointment:   Your physician recommends that you schedule a follow-up appointment in: 4-6 week follow up with Dr. de [place]  You will receive a text message or e-mail with a link to a survey about your care and experience with [place] today! We would greatly appreciate your feedback!   Thanks for letting us be apart of your health journey!!  Primary Care and Sports Medicine   Dr. Ceasar Mons Peru   We encourage you to activate your patient portal called "MyChart".  Sign up information is provided on this After Visit Summary.  MyChart is used to connect with patients for Virtual Visits (Telemedicine).  Patients are able to view lab/test results, encounter notes, upcoming appointments, etc.  Non-urgent messages can be sent to your provider as well. To learn more about what you can do with MyChart, please visit --  [email or web address].

## 2023-07-17 ENCOUNTER — Encounter (HOSPITAL_BASED_OUTPATIENT_CLINIC_OR_DEPARTMENT_OTHER): Payer: Self-pay | Admitting: *Deleted

## 2023-07-17 ENCOUNTER — Other Ambulatory Visit (HOSPITAL_BASED_OUTPATIENT_CLINIC_OR_DEPARTMENT_OTHER): Payer: Self-pay

## 2023-07-17 ENCOUNTER — Encounter: Payer: Self-pay | Admitting: Physical Medicine & Rehabilitation

## 2023-07-17 ENCOUNTER — Encounter
Payer: No Typology Code available for payment source | Attending: Physical Medicine & Rehabilitation | Admitting: Physical Medicine & Rehabilitation

## 2023-07-17 VITALS — BP 135/81 | Ht 65.0 in | Wt 345.0 lb

## 2023-07-17 DIAGNOSIS — M5416 Radiculopathy, lumbar region: Secondary | ICD-10-CM | POA: Insufficient documentation

## 2023-07-17 DIAGNOSIS — M47816 Spondylosis without myelopathy or radiculopathy, lumbar region: Secondary | ICD-10-CM

## 2023-07-17 LAB — MICROALBUMIN / CREATININE URINE RATIO
Creatinine, Urine: 126.5 mg/dL
Microalb/Creat Ratio: 29 mg/g{creat} (ref 0–29)
Microalbumin, Urine: 36.3 ug/mL

## 2023-07-17 MED ORDER — NORTRIPTYLINE HCL 10 MG PO CAPS
10.0000 mg | ORAL_CAPSULE | Freq: Every day | ORAL | 4 refills | Status: DC
  Filled 2023-07-17: qty 60, 30d supply, fill #0

## 2023-07-17 NOTE — Progress Notes (Signed)
Subjective:    Patient ID: Molly Archer, female    DOB: Jun 10, 1981, 42 y.o.   MRN: [id number]  HPI  Molly Archer is here in follow up of her chronic pain.  She continues to have pain from her right low back and hip down the anterior lateral portion of the leg to her toes.  She does now reports that she has some tingling in her toes.  Pain seems to be worse when she is sitting but also can be a problem when she is lying.  She finds the most comfortable position is when she is sitting upright with a pillow under her right leg.  Oxycodone does seem to help with her pain although she cannot take it at nighttime as it tends to keep her awake.  Diclofenac is of some benefit for pain as well.  We reviewed her 2 MRIs that have been done over the last couple of months and results are as follows:   Right knee MRI: IMPRESSION: 1. Tricompartmental osteoarthritis, with substantial lateral marginal spurring of the patella. 2. Moderate knee effusion. 3. Thickened distal iliotibial band but without surrounding edema to further suggest iliotibial band syndrome.  MRI lumbar spine:  T12-L1: Mild bilateral facet degenerative change. No spinal canal narrowing. No neural foraminal narrowing.   L1-L2: Mild bilateral facet degenerative change. No significant disc bulge. No spinal canal narrowing. No neural foraminal narrowing.   L2-L3: Mild bilateral facet degenerative change. No spinal canal narrowing. No neural foraminal narrowing.   L3-L4: Moderate bilateral facet degenerative change with fluid in the facet joints. No significant disc bulge. No spinal canal narrowing. Mild bilateral neural foraminal narrowing.   L4-L5: Moderate bilateral facet degenerative change. Redemonstrated right foraminal disc protrusion which likely contacts the exiting right L4 nerve root, unchanged. Mild spinal canal narrowing. Mild bilateral neural foraminal narrowing.   L5-S1: Moderate bilateral facet degenerative  change. No significant disc bulge. No spinal canal narrowing. No neural foraminal   I went back and reviewed her procedure reports from Dr. Alvester Morin and she has had a right L4-L5 translaminar to the right ESI as well as a right transforaminal at L4.  Neither of these injections appear to help her.  We talked about medications for her nerve pain and she has tried gabapentin and Lyrica in the past and neither of these seem to provide any benefit.  Pain Inventory Average Pain 8 Pain Right Now 7 My pain is burning, tingling, and aching  In the last 24 hours, has pain interfered with the following? General activity 8 Relation with others 6 Enjoyment of life 9 What TIME of day is your pain at its worst? morning , daytime, evening, and night Sleep (in general) Poor  Pain is worse with: walking, bending, sitting, inactivity, and standing Pain improves with: rest and medication Relief from Meds: 7  Family History  Problem Relation Age of Onset   Hypertension Other    Diabetes Other    Cancer Other    Hypertension Mother    Hyperlipidemia Mother    Hypertension Father    Social History   Socioeconomic History   Marital status: Single    Spouse name: Not on file   Number of children: Not on file   Years of education: Not on file   Highest education level: Bachelor's degree (e.g., BA, AB, BS)  Occupational History   Not on file  Tobacco Use   Smoking status: Never    Passive exposure: Never   Smokeless  tobacco: Never  Vaping Use   Vaping status: Never Used  Substance and Sexual Activity   Alcohol use: No   Drug use: No   Sexual activity: Not Currently    Birth control/protection: None  Other Topics Concern   Not on file  Social History Narrative   She is from [place], [place], moved here 1998.    Lives at home by herself.    Social Drivers of Corporate investment banker Strain: High Risk (07/13/2023)   Overall Financial Resource Strain (CARDIA)    Difficulty of Paying  Living Expenses: Very hard  Food Insecurity: Food Insecurity Present (07/13/2023)   Hunger Vital Sign    Worried About Running Out of Food in the Last Year: Often true    Ran Out of Food in the Last Year: Sometimes true  Transportation Needs: No Transportation Needs (07/13/2023)   PRAPARE - Administrator, Civil Service (Medical): No    Lack of Transportation (Non-Medical): No  Physical Activity: Insufficiently Active (07/13/2023)   Exercise Vital Sign    Days of Exercise per Week: 1 day    Minutes of Exercise per Session: 10 min  Stress: Stress Concern Present (07/13/2023)   [employer] of Occupational Health - Occupational Stress Questionnaire    Feeling of Stress : To some extent  Social Connections: Moderately Isolated (07/13/2023)   Social Connection and Isolation Panel [NHANES]    Frequency of Communication with Friends and Family: Once a week    Frequency of Social Gatherings with Friends and Family: Never    Attends Religious Services: More than 4 times per year    Active Member of Clubs or Organizations: Yes    Attends Banker Meetings: 1 to 4 times per year    Marital Status: Never married   Past Surgical History:  Procedure Laterality Date   COMBINED HYSTEROSCOPY DIAGNOSTIC / D&C N/A 2011   Benign secretory endometrium   CYSTOSCOPY WITH STENT PLACEMENT Left 10/19/2014   Procedure: CYSTOSCOPY WITH STENT PLACEMENT;  Surgeon: Crist Fat, MD;  Location: [facility];  Service: Urology;  Laterality: Left;   DILATION AND CURETTAGE OF UTERUS  10/2003   Polyp, mild atypia, simple and complex hyperplasia,   DILATION AND CURETTAGE OF UTERUS  11/2003   Simple and complex hyperplasia   DILATION AND CURETTAGE OF UTERUS  2011   DILATION AND CURETTAGE OF UTERUS N/A 12/14/2014   Procedure: DILATATION AND CURETTAGE;  Surgeon: Catalina Antigua, MD;  Location: [facility];  Service: Gynecology;  Laterality: N/A;   HOLMIUM LASER APPLICATION Left 10/19/2014    Procedure: HOLMIUM LASER APPLICATION;  Surgeon: Crist Fat, MD;  Location: [facility];  Service: Urology;  Laterality: Left;   HYSTEROSCOPY WITH D & C N/A 07/21/2018   Procedure: DILATATION AND CURETTAGE /HYSTEROSCOPY;  Surgeon: Adam Phenix, MD;  Location: [facility];  Service: Gynecology;  Laterality: N/A;   INTRAUTERINE DEVICE (IUD) INSERTION N/A 12/14/2014   Procedure: INTRAUTERINE DEVICE (IUD) INSERTION;  Surgeon: Catalina Antigua, MD;  Location: [facility];  Service: Gynecology;  Laterality: N/A;   STONE EXTRACTION WITH BASKET Left 10/19/2014   Procedure: STONE EXTRACTION WITH BASKET;  Surgeon: Crist Fat, MD;  Location: [facility];  Service: Urology;  Laterality: Left;   WISDOM TOOTH EXTRACTION     Past Surgical History:  Procedure Laterality Date   COMBINED HYSTEROSCOPY DIAGNOSTIC / D&C N/A 2011   Benign secretory endometrium   CYSTOSCOPY WITH STENT PLACEMENT Left 10/19/2014   Procedure:  CYSTOSCOPY WITH STENT PLACEMENT;  Surgeon: Crist Fat, MD;  Location: [facility];  Service: Urology;  Laterality: Left;   DILATION AND CURETTAGE OF UTERUS  10/2003   Polyp, mild atypia, simple and complex hyperplasia,   DILATION AND CURETTAGE OF UTERUS  11/2003   Simple and complex hyperplasia   DILATION AND CURETTAGE OF UTERUS  2011   DILATION AND CURETTAGE OF UTERUS N/A 12/14/2014   Procedure: DILATATION AND CURETTAGE;  Surgeon: Catalina Antigua, MD;  Location: [facility];  Service: Gynecology;  Laterality: N/A;   HOLMIUM LASER APPLICATION Left 10/19/2014   Procedure: HOLMIUM LASER APPLICATION;  Surgeon: Crist Fat, MD;  Location: [facility];  Service: Urology;  Laterality: Left;   HYSTEROSCOPY WITH D & C N/A 07/21/2018   Procedure: DILATATION AND CURETTAGE /HYSTEROSCOPY;  Surgeon: Adam Phenix, MD;  Location: [facility];  Service: Gynecology;  Laterality: N/A;   INTRAUTERINE DEVICE (IUD) INSERTION N/A 12/14/2014   Procedure: INTRAUTERINE DEVICE (IUD) INSERTION;  Surgeon: Catalina Antigua, MD;  Location:  [facility];  Service: Gynecology;  Laterality: N/A;   STONE EXTRACTION WITH BASKET Left 10/19/2014   Procedure: STONE EXTRACTION WITH BASKET;  Surgeon: Crist Fat, MD;  Location: [facility];  Service: Urology;  Laterality: Left;   WISDOM TOOTH EXTRACTION     Past Medical History:  Diagnosis Date   Anemia    Asthma    only uses inhaler in fall and spring   Constipation    GERD (gastroesophageal reflux disease)    diet controlled, occ. uses omeprazole   Headache    otc med prn   History of kidney stones    Hx of Bell's palsy June 2013   Hypertension    currently no meds   Menometrorrhagia    Prediabetes    Psoriasis    Shortness of breath dyspnea    with exercise/exertion   Vitamin D deficiency    Ht 5\' 5"  (1.651 m)   Wt (!) 345 lb (156.5 kg)   BMI 57.41 kg/m   Opioid Risk Score:   Fall Risk Score:  `1  Depression screen [facility] 2/9     07/17/2023    2:35 PM 07/16/2023    2:58 PM 06/25/2023   10:41 AM 06/18/2023    3:34 PM 05/27/2023    4:36 PM 05/15/2023    9:59 AM 04/16/2023   11:12 AM  Depression screen PHQ 2/9  Decreased Interest 0 0 0 0 0 2 0  Down, Depressed, Hopeless 0 0 0 0 0 1 0  PHQ - 2 Score 0 0 0 0 0 3 0  Altered sleeping  0 3 0  3 0  Tired, decreased energy  0 3 0  2 0  Change in appetite  0 0 0  2 0  Feeling bad or failure about yourself   0 0 0  1 0  Trouble concentrating  0 0 0  1 0  Moving slowly or fidgety/restless  0 0 0  1 0  Suicidal thoughts  0 0 0  0 0  PHQ-9 Score  0 6 0  13 0  Difficult doing work/chores  Not difficult at all Somewhat difficult Not difficult at all  Very difficult       Review of Systems  Musculoskeletal:  Positive for gait problem.  All other systems reviewed and are negative.     Objective:   Physical Exam General: No acute distress HEENT: NCAT, EOMI, oral membranes moist Cards: reg rate  Chest: normal effort Abdomen: Soft, NT, ND Skin: dry, intact Extremities: no edema Psych: pleasant and appropriate   Skin: intact Neuro: Alert and oriented x 3. Normal insight and awareness. Intact Memory. Normal language and speech. Cranial nerve exam unremarkable. MMT: 5/5 in all 4's. Sensory exam normal for light touch and pain in all 4 limbs. No limb ataxia or cerebellar signs. No abnormal tone appreciated.  .   Musculoskeletal: Patient with generalized tenderness of her right knee. Some pain with patella mobilization. SLR is equivocal to positive hamstring still tight. Low back with mild tenderness to palpation on right. Glute tight?           Assessment & Plan:    Chronic pain syndrome with chronic low back pain, bilateral OA of knees.  She has had appropriate treatment thus far including epidural injections, therapy, numerous muscle relaxants NSAIDs and medications for neuropathic pain.  I suspect her right leg pain is more related to knee then back.  Morbid obesity.  I do feel that this is a major driver for her lower extremity pain, particularly the knees.  She has made some efforts in the past to lose her weight but states that she has always been heavy.  She does see nutritionist next week.       Plan: Refer to outpt PT for Mckenzie technique, core strengthening and lengthening Have discussed weight loss. Trial of pamelor 10-20mg  at bedtime for sleep and neuropathic pain. Has tried gabapentin and lyrica without benefit before. Low impact exercising as possible UDS ok Maintain oxycodone to 10mg  every 8 hours as needed #75--recently refilled. Continue controlled substance monitoring program, this consists of regular clinic visits, examinations, routine drug screening, pill counts as well as use of [place] Controlled Substance Reporting System. [place].    7.  Continue diclofenac 75 mg p.o. twice daily with food     20+minutes of face to face patient care time were spent during this visit. All questions were encouraged and answered. Follow up with me in about 2  mos

## 2023-07-17 NOTE — Patient Instructions (Signed)
ALWAYS FEEL FREE TO CALL OUR OFFICE WITH ANY PROBLEMS OR QUESTIONS [phone])  **PLEASE NOTE** ALL MEDICATION REFILL REQUESTS (INCLUDING CONTROLLED SUBSTANCES) NEED TO BE MADE AT LEAST 7 DAYS PRIOR TO REFILL BEING DUE. ANY REFILL REQUESTS INSIDE THAT TIME FRAME MAY RESULT IN DELAYS IN RECEIVING YOUR PRESCRIPTION.    !!!!!!!HAPPY HOLIDAYS!!!!!!

## 2023-07-19 ENCOUNTER — Other Ambulatory Visit (HOSPITAL_BASED_OUTPATIENT_CLINIC_OR_DEPARTMENT_OTHER): Payer: Self-pay

## 2023-07-29 ENCOUNTER — Ambulatory Visit: Payer: No Typology Code available for payment source | Admitting: Licensed Clinical Social Worker

## 2023-07-29 DIAGNOSIS — F4322 Adjustment disorder with anxiety: Secondary | ICD-10-CM | POA: Diagnosis not present

## 2023-07-29 NOTE — Progress Notes (Signed)
 NEUROLOGY CONSULTATION NOTE  Molly Archer MRN: [id number] DOB: 1981-01-21  Referring provider: Raymond de Cuba, MD Primary care provider: Raymond de Cuba, MD  Reason for consult:  idiopathic intracranial hypertension  Assessment/Plan:   Migraine without aura, without status migrainosus, intractable   Based on history, testing and semiology, idiopathic intracranial hypertension is not suspected.   Migraine prevention:  Start topiramate  25mg  at bedtime for one week, then increase to 50mg  at bedtime Migraine rescue:  Try samples of Ubrelvy 100mg . Limit use of pain relievers to no more than 2 days out of week to prevent risk of rebound or medication-overuse headache. Keep headache diary Follow up 6 months.    Subjective:  Molly Archer is a 42 year old right-handed female with HTN, DM 2, MDD, GAD, chronic pain and history of Bell's palsy and kidney stones who presents for idiopathic intracranial hypertension.  History supplemented by prior neurologist's and referring provider's notes.  History of headaches since childhood.   Became worse around 2020-2021.   Described as 8/10 pounding pain behind the eyes or back of her head and neck, bilateral but usually left sided.  Associated with nausea, photophobia, phonophobia, osmophobia. If she blinks or turns too quickly, may briefly see flashes in her vision.  Lasts 3-4 days and occurs 2 times a month.  Better when laying down.  Aggravated by routine activity.  They seem to be worse when she is having elevated blood pressure.  She was told in 2021 that she may have idiopathic intracranial hypertension based on findings of brain MRI.  However, MRI of brain with and without contrast on 06/13/2020 was normal (no evidence of partial empty sella or other constellation of findings). She has never been diagnosed with papilledema on eye exam.  Never had an LP.  Seen by neurology at [facility] in October 2021 and was diagnosed with migraines.   MRV of head on 05/18/2022 showed left system dominance with diminutive right transverse sinus but no evidence of venous thrombosis or focal stenosis.  She continues to have routine eye exams which are unremarkable.  Denies visual obscurations or pulsatile tinnitus.    Past NSAIDS/analgesics:  Toradol  inj, tramadol  Past abortive triptans:  sumatriptan, rizatriptan Past abortive ergotamine:  none Past muscle relaxants:  none Past anti-emetic:  Zofran , promethazine  Past antihypertensive medications:  amlodipine  Past antidepressant medications:  none Past anticonvulsant medications:  none Past anti-CGRP:  none Past vitamins/Herbal/Supplements:  none Past antihistamines/decongestants:  Benadryl , Zyrtec Other past therapies:  none  Frequency of pain relievers:  Motrin  or Tylenol  for headaches once a week; diclofenac  and oxycodone  once daily for chronic pain Current NSAIDS/analgesics:  diclofenac  75mg , oxycodone  PRN (pain) Current triptans:  none Current ergotamine:  none Current anti-emetic:  none Current muscle relaxants:  tizanidine  4mg  PRN Current Antihypertensive medications:  irbesartan -hydrochlorothiazide , nifedipine , doxazosin  Current Antidepressant medications:  nortriptyline  10-20mg  at bedtime (chronic pain - makes headaches worse) Current Anticonvulsant medications:  none Current anti-CGRP:  none Current Vitamins/Herbal/Supplements:  none Current Antihistamines/Decongestants:  Flonase  Other therapy:  none Birth control:  Aygestin  Other medications:  Ambien    Caffeine:  occasionally tea.  No coffee or soda Drinks a lot of water.   Depression:  yes.  Seeing a therapist; Anxiety:  no Exercise:  stretches, limited due to pain Other pain:  chronic pain Sleep hygiene:  History of chronic insomnia.  She was diagnosed with mild OSA in October 2023.  A PAP was not felt to be helpful.  Takes Ambien  which helps her  fall asleep but wakes up after 3 hours.  Gets 5 hours of sleep a night  (inconsistent) - has to move around from bed to sofa to recliner due to discomfort Family history of headache:  No      PAST MEDICAL HISTORY: Past Medical History:  Diagnosis Date   Anemia    Asthma    only uses inhaler in fall and spring   Constipation    GERD (gastroesophageal reflux disease)    diet controlled, occ. uses omeprazole    Headache    otc med prn   History of kidney stones    Hx of Bell's palsy June 2013   Hypertension    currently no meds   Menometrorrhagia    Prediabetes    Psoriasis    Shortness of breath dyspnea    with exercise/exertion   Vitamin D  deficiency     PAST SURGICAL HISTORY: Past Surgical History:  Procedure Laterality Date   COMBINED HYSTEROSCOPY DIAGNOSTIC / D&C N/A 2011   Benign secretory endometrium   CYSTOSCOPY WITH STENT PLACEMENT Left 10/19/2014   Procedure: CYSTOSCOPY WITH STENT PLACEMENT;  Surgeon: Morene LELON Salines, MD;  Location: [facility];  Service: Urology;  Laterality: Left;   DILATION AND CURETTAGE OF UTERUS  10/2003   Polyp, mild atypia, simple and complex hyperplasia,   DILATION AND CURETTAGE OF UTERUS  11/2003   Simple and complex hyperplasia   DILATION AND CURETTAGE OF UTERUS  2011   DILATION AND CURETTAGE OF UTERUS N/A 12/14/2014   Procedure: DILATATION AND CURETTAGE;  Surgeon: Winton Felt, MD;  Location: [facility];  Service: Gynecology;  Laterality: N/A;   HOLMIUM LASER APPLICATION Left 10/19/2014   Procedure: HOLMIUM LASER APPLICATION;  Surgeon: Morene LELON Salines, MD;  Location: [facility];  Service: Urology;  Laterality: Left;   HYSTEROSCOPY WITH D & C N/A 07/21/2018   Procedure: DILATATION AND CURETTAGE /HYSTEROSCOPY;  Surgeon: Eveline Lynwood MATSU, MD;  Location: [facility];  Service: Gynecology;  Laterality: N/A;   INTRAUTERINE DEVICE (IUD) INSERTION N/A 12/14/2014   Procedure: INTRAUTERINE DEVICE (IUD) INSERTION;  Surgeon: Winton Felt, MD;  Location: [facility];  Service: Gynecology;  Laterality: N/A;   STONE EXTRACTION WITH BASKET  Left 10/19/2014   Procedure: STONE EXTRACTION WITH BASKET;  Surgeon: Morene LELON Salines, MD;  Location: [facility];  Service: Urology;  Laterality: Left;   WISDOM TOOTH EXTRACTION      MEDICATIONS: Current Outpatient Medications on File Prior to Visit  Medication Sig Dispense Refill   albuterol  (PROVENTIL  HFA;VENTOLIN  HFA) 108 (90 Base) MCG/ACT inhaler Inhale 2 puffs into the lungs every 4 (four) hours as needed for wheezing or shortness of breath (cough, shortness of breath or wheezing.). 1 Inhaler 1   cetirizine (ZYRTEC) 10 MG tablet Take by mouth.     Ciclopirox  1 % shampoo Apply 1 each topically at bedtime. 120 mL 0   clobetasol  ointment (TEMOVATE ) 0.05 % Apply topically 2 (two) times daily. 30 g 1   diclofenac  (VOLTAREN ) 75 MG EC tablet Take 1 tablet (75 mg total) by mouth 2 (two) times daily with a meal. 60 tablet 3   doxazosin  (CARDURA ) 1 MG tablet Take 1 mg by mouth daily.     fluocinolone  0.01 % cream Apply twice a week.     Fluocinolone  Acetonide Scalp 0.01 % OIL Apply twice a week.     fluticasone  (FLONASE ) 50 MCG/ACT nasal spray Administer 1 spray in each nostril 2 times daily.     glipiZIDE  (GLUCOTROL  XL) 10 MG  24 hr tablet Take 1 tablet (10 mg total) by mouth daily. 90 tablet 1   glucose blood (PRECISION QID TEST) test strip Check glucose twice daily as needed.     hydrochlorothiazide  (MICROZIDE ) 12.5 MG capsule Take 1 capsule (12.5 mg total) by mouth daily. 90 capsule 1   ipratropium-albuterol  (DUONEB) 0.5-2.5 (3) MG/3ML SOLN Inhale 3 mLs into the lungs every 6 (six) hours as needed. 360 mL 5   irbesartan -hydrochlorothiazide  (AVALIDE) 300-12.5 MG tablet Take 1 tablet by mouth daily. 90 tablet 1   Naftifine  HCl (NAFTIN ) 2 % CREA Apply liberally to affected area daily 60 g 1   NIFEdipine  (PROCARDIA  XL/NIFEDICAL XL) 60 MG 24 hr tablet Take 1 tablet (60 mg total) by mouth daily. 90 tablet 1   norethindrone  (AYGESTIN ) 5 MG tablet Take 3 tablets (15 mg total) by mouth daily. 180 tablet 5    nortriptyline  (PAMELOR ) 10 MG capsule Take 1-2 capsules (10-20 mg total) by mouth at bedtime. 60 capsule 4   Oxycodone  HCl 10 MG TABS Take 1 tablet (10 mg total) by mouth every 8 (eight) hours as needed. 75 tablet 0   tirzepatide  (MOUNJARO ) 2.5 MG/0.5ML Pen Inject 2.5 mg into the skin once a week. 2 mL 1   tiZANidine  (ZANAFLEX ) 4 MG tablet Take 1 tablet (4 mg total) by mouth every 8 (eight) hours as needed for muscle spasms. 30 tablet 0   triamcinolone  cream (KENALOG ) 0.1 % Apply 1 application topically 2 (two) times daily. 30 g 0   zolpidem  (AMBIEN ) 5 MG tablet Take 1 tablet (5 mg total) by mouth at bedtime as needed for sleep. 30 tablet 0   No current facility-administered medications on file prior to visit.    ALLERGIES: Allergies  Allergen Reactions   Penicillins Hives    Has patient had a PCN reaction causing immediate rash, facial/tongue/throat swelling, SOB or lightheadedness with hypotension: No Has patient had a PCN reaction causing severe rash involving mucus membranes or skin necrosis: No Has patient had a PCN reaction that required hospitalization No Has patient had a PCN reaction occurring within the last 10 years: No If all of the above answers are NO, then may proceed with Cephalosporin use.    Tramadol  Hives    FAMILY HISTORY: Family History  Problem Relation Age of Onset   Hypertension Other    Diabetes Other    Cancer Other    Hypertension Mother    Hyperlipidemia Mother    Hypertension Father     Objective:  Blood pressure 130/66, pulse (!) 102, height 5' 5 (1.651 m), weight (!) 351 lb 6.4 oz (159.4 kg). General: No acute distress.  Patient appears well-groomed.   Head:  Normocephalic/atraumatic Eyes:  fundi examined but not visualized Neck: supple, no paraspinal tenderness, full range of motion Back: No paraspinal tenderness Heart: regular rate and rhythm Lungs: Clear to auscultation bilaterally. Vascular: No carotid bruits. Neurological  Exam: Mental status: alert and oriented to person, place, and time, speech fluent and not dysarthric, language intact. Cranial nerves: CN I: not tested CN II: pupils equal, round and reactive to light, visual fields intact CN III, IV, VI:  full range of motion, no nystagmus, no ptosis CN V: facial sensation intact. CN VII: upper and lower face symmetric CN VIII: hearing intact CN IX, X: gag intact, uvula midline CN XI: sternocleidomastoid and trapezius muscles intact CN XII: tongue midline Bulk & Tone: normal, no fasciculations. Motor:  muscle strength 5/5 throughout Sensation:  Pinprick, temperature and vibratory  sensation intact. Deep Tendon Reflexes:  2+ throughout,  toes downgoing.   Finger to nose testing:  Without dysmetria.   Heel to shin:  Without dysmetria.   Gait:  Normal station and stride.  Romberg negative.    Thank you for allowing me to take part in the care of this patient.  Juliene Dunnings, DO  CC: Raymond de Cuba, MD

## 2023-07-30 ENCOUNTER — Encounter: Payer: Self-pay | Admitting: Neurology

## 2023-07-30 ENCOUNTER — Ambulatory Visit (INDEPENDENT_AMBULATORY_CARE_PROVIDER_SITE_OTHER): Payer: No Typology Code available for payment source | Admitting: Neurology

## 2023-07-30 ENCOUNTER — Other Ambulatory Visit (HOSPITAL_BASED_OUTPATIENT_CLINIC_OR_DEPARTMENT_OTHER): Payer: Self-pay

## 2023-07-30 VITALS — BP 130/66 | HR 102 | Ht 65.0 in | Wt 351.4 lb

## 2023-07-30 DIAGNOSIS — R200 Anesthesia of skin: Secondary | ICD-10-CM | POA: Diagnosis not present

## 2023-07-30 DIAGNOSIS — G43019 Migraine without aura, intractable, without status migrainosus: Secondary | ICD-10-CM

## 2023-07-30 MED ORDER — TOPIRAMATE 50 MG PO TABS
ORAL_TABLET | ORAL | 0 refills | Status: DC
  Filled 2023-07-30: qty 30, 30d supply, fill #0

## 2023-07-30 NOTE — Patient Instructions (Signed)
 Start topiramate  50mg  tablet - take 1/2 tablet at bedtime for one week, then increase to 1 tablet at bedtime.  Contact me for refill in 4-5 weeks with update. Take ubrelvy at earliest onset of headache.  May repeat after 2 hours.  Maximum 2 tablets in 24 hours.  Let me know if it works so I can send in prescription or try something different. Limit use of pain relievers to no more than 2 days out of week to prevent risk of rebound or medication-overuse headache. Keep headache diary Follow migraine diet recommendations Follow up 6 months.

## 2023-07-30 NOTE — Progress Notes (Signed)
 Medication Samples have been provided to the patient.  Drug name: holland       Strength: 100 mg        Qty: 4  LOT: [id number]  Exp.Date: 1/26  Dosing instructions: as needed  The patient has been instructed regarding the correct time, dose, and frequency of taking this medication, including desired effects and most common side effects.   Molly Archer 8:21 AM 07/30/2023

## 2023-08-05 ENCOUNTER — Other Ambulatory Visit (HOSPITAL_BASED_OUTPATIENT_CLINIC_OR_DEPARTMENT_OTHER): Payer: Self-pay

## 2023-08-09 ENCOUNTER — Other Ambulatory Visit (HOSPITAL_BASED_OUTPATIENT_CLINIC_OR_DEPARTMENT_OTHER): Payer: Self-pay

## 2023-08-10 ENCOUNTER — Other Ambulatory Visit (HOSPITAL_BASED_OUTPATIENT_CLINIC_OR_DEPARTMENT_OTHER): Payer: Self-pay | Admitting: Family Medicine

## 2023-08-12 ENCOUNTER — Other Ambulatory Visit (HOSPITAL_BASED_OUTPATIENT_CLINIC_OR_DEPARTMENT_OTHER): Payer: Self-pay

## 2023-08-12 ENCOUNTER — Other Ambulatory Visit: Payer: Self-pay

## 2023-08-12 ENCOUNTER — Ambulatory Visit: Payer: Medicaid Other | Admitting: Licensed Clinical Social Worker

## 2023-08-12 DIAGNOSIS — F4322 Adjustment disorder with anxiety: Secondary | ICD-10-CM | POA: Diagnosis not present

## 2023-08-12 MED ORDER — TIZANIDINE HCL 4 MG PO TABS
4.0000 mg | ORAL_TABLET | Freq: Three times a day (TID) | ORAL | 0 refills | Status: DC | PRN
  Filled 2023-08-12: qty 30, 10d supply, fill #0

## 2023-08-12 MED ORDER — ZOLPIDEM TARTRATE 5 MG PO TABS
5.0000 mg | ORAL_TABLET | Freq: Every evening | ORAL | 0 refills | Status: DC | PRN
  Filled 2023-08-12: qty 30, 30d supply, fill #0

## 2023-08-12 NOTE — Progress Notes (Signed)
 [facility] Counselor/Therapist Progress Note  Patient ID: Molly Archer, MRN: [id number]    Date: 08/12/23  Time Spent: 0405  pm - 0500 pm : 55 Minutes  Treatment Type: Individual Therapy.  Reported Symptoms: anxiety related to social interaction and church relationships  Mental Status Exam: Appearance:  Casual     Behavior: Appropriate  Motor: Normal  Speech/Language:  Clear and Coherent  Affect: Appropriate  Mood: normal  Thought process: normal  Thought content:   WNL  Sensory/Perceptual disturbances:   WNL  Orientation: oriented to person, place, time/date, situation, day of week, month of year, and year  Attention: Good  Concentration: Good  Memory: WNL  Fund of knowledge:  Good  Insight:   Good  Judgment:  Good  Impulse Control: Good   Risk Assessment: Danger to Self:  No Self-injurious Behavior: No Danger to Others: No Duty to Warn:no Physical Aggression / Violence:No  Access to Firearms a concern: No  Gang Involvement:No   Subjective:   Molly Archer participated from home, via video, patient was aware of risk and limitations, and consented to treatment. Therapist participated from office. We met online due to patient request.   Molly Archer presented for her session via video. Patient was polite and cooperative and freely engaged in discussion with Clinician. Patient reported that she has worked from home Friday and today. She reports having difficulty focusing on her work and reports that she has the same issues at work. Patient reports being more emotional and crying at times for no reason. She reports that at times she wonders if she will ever marry and have a family. She acknowledged that is something she finds herself thinking about more often.  Patient stated that she finds herself feeling even more upset when she acknowledges that she doesn't go anywhere to meet people or even socialize. She reports that she gets anxious when she interacts  with others, even on the phone. We discussed how she allows her self image to affect how she presents herself and interacts. Clinician provided patient with positive feedback acknowledging how well patient dresses and presents herself. Although patient doesn't realize how well she cares for herself and presents publicly. Patient identified a desire as well as a need to become involved back into a church and her community rather as a leader or to sit in the pew and gain knowledge.  Clinician and patient processed coping skills to assist her in feeling more connected and ways to find fulfillment in socia situations. We discussed visiting various churches and ministries, community involvement as well as allowing herself to be vulnerable in making friends and doing things with those friends. Patient will continue with biweekly therapy sessions utilizing CBT techniques including cognitive restructuring, and relaxation techniques. Treatment plan to be reviewed by 05/15/2024.    Goals include gradually increasing exposure to social situations, practicing social skills like eye contact and conversation initiation, and utilizing coping mechanisms like deep breathing, with the aim of building confidence and reducing anxiety in social settings; examples include attending one social event per month, initiating a conversation with a new person each week, or making small talk with a conservation officer, nature at the store.    Interventions: Cognitive Behavioral Therapy  Diagnosis: Adjustment Disorder with Anxious Mood   Damien Junk MSW, LCSW/DATE 08/12/2023

## 2023-08-13 ENCOUNTER — Telehealth (HOSPITAL_BASED_OUTPATIENT_CLINIC_OR_DEPARTMENT_OTHER): Payer: Self-pay | Admitting: *Deleted

## 2023-08-13 ENCOUNTER — Other Ambulatory Visit (HOSPITAL_BASED_OUTPATIENT_CLINIC_OR_DEPARTMENT_OTHER): Payer: Self-pay

## 2023-08-13 DIAGNOSIS — Z0120 Encounter for dental examination and cleaning without abnormal findings: Secondary | ICD-10-CM | POA: Diagnosis not present

## 2023-08-13 NOTE — Telephone Encounter (Signed)
 Copied from CRM [id number]. Topic: General - Other >> Aug 13, 2023  8:37 AM Benton KIDD wrote: Reason for CRM: patient is calling back concerning a handicap placard and for Dr Everitt Cuba to get one started for patient . I see the crm but no one has grabbed it yet and it looks like it was sent wrong

## 2023-08-13 NOTE — Telephone Encounter (Signed)
 Please advise if you are okay filling out handicap placard for patient

## 2023-08-13 NOTE — Telephone Encounter (Signed)
 LVM to let pt know handicap placard has been started will call when ready to pick up

## 2023-08-15 ENCOUNTER — Ambulatory Visit (HOSPITAL_BASED_OUTPATIENT_CLINIC_OR_DEPARTMENT_OTHER): Payer: Managed Care, Other (non HMO) | Attending: Physical Medicine & Rehabilitation | Admitting: Physical Therapy

## 2023-08-15 ENCOUNTER — Other Ambulatory Visit: Payer: Self-pay

## 2023-08-15 ENCOUNTER — Encounter (HOSPITAL_BASED_OUTPATIENT_CLINIC_OR_DEPARTMENT_OTHER): Payer: Self-pay | Admitting: Physical Therapy

## 2023-08-15 DIAGNOSIS — R29898 Other symptoms and signs involving the musculoskeletal system: Secondary | ICD-10-CM | POA: Insufficient documentation

## 2023-08-15 DIAGNOSIS — R2689 Other abnormalities of gait and mobility: Secondary | ICD-10-CM | POA: Diagnosis present

## 2023-08-15 DIAGNOSIS — M6281 Muscle weakness (generalized): Secondary | ICD-10-CM | POA: Insufficient documentation

## 2023-08-15 DIAGNOSIS — M47816 Spondylosis without myelopathy or radiculopathy, lumbar region: Secondary | ICD-10-CM | POA: Diagnosis not present

## 2023-08-15 DIAGNOSIS — M5459 Other low back pain: Secondary | ICD-10-CM | POA: Diagnosis present

## 2023-08-15 DIAGNOSIS — M25561 Pain in right knee: Secondary | ICD-10-CM | POA: Diagnosis present

## 2023-08-15 DIAGNOSIS — M5416 Radiculopathy, lumbar region: Secondary | ICD-10-CM | POA: Diagnosis not present

## 2023-08-15 NOTE — Therapy (Signed)
OUTPATIENT PHYSICAL THERAPY THORACOLUMBAR EVALUATION   Patient Name: Molly Archer MRN: [id number] DOB:[birth year], 43 y.o., female Today's Date: 08/15/2023  END OF SESSION:  PT End of Session - 08/15/23 1418     Visit Number 1    Number of Visits 16    Date for PT Re-Evaluation 10/24/23    Authorization Type [place] MEDICAID [employer]    PT Start Time 1420    PT Stop Time 1505    PT Time Calculation (min) 45 min    Activity Tolerance Patient tolerated treatment well    Behavior During Therapy [facility] for tasks assessed/performed             Past Medical History:  Diagnosis Date   Anemia    Asthma    only uses inhaler in fall and spring   Constipation    GERD (gastroesophageal reflux disease)    diet controlled, occ. uses omeprazole   Headache    otc med prn   History of kidney stones    Hx of Bell's palsy June 2013   Hypertension    currently no meds   Menometrorrhagia    Prediabetes    Psoriasis    Shortness of breath dyspnea    with exercise/exertion   Vitamin D deficiency    Past Surgical History:  Procedure Laterality Date   COMBINED HYSTEROSCOPY DIAGNOSTIC / D&C N/A 2011   Benign secretory endometrium   CYSTOSCOPY WITH STENT PLACEMENT Left 10/19/2014   Procedure: CYSTOSCOPY WITH STENT PLACEMENT;  Surgeon: Crist Fat, MD;  Location: [facility];  Service: Urology;  Laterality: Left;   DILATION AND CURETTAGE OF UTERUS  10/2003   Polyp, mild atypia, simple and complex hyperplasia,   DILATION AND CURETTAGE OF UTERUS  11/2003   Simple and complex hyperplasia   DILATION AND CURETTAGE OF UTERUS  2011   DILATION AND CURETTAGE OF UTERUS N/A 12/14/2014   Procedure: DILATATION AND CURETTAGE;  Surgeon: Catalina Antigua, MD;  Location: [facility];  Service: Gynecology;  Laterality: N/A;   HOLMIUM LASER APPLICATION Left 10/19/2014   Procedure: HOLMIUM LASER APPLICATION;  Surgeon: Crist Fat, MD;  Location: [facility];  Service: Urology;  Laterality: Left;    HYSTEROSCOPY WITH D & C N/A 07/21/2018   Procedure: DILATATION AND CURETTAGE /HYSTEROSCOPY;  Surgeon: Adam Phenix, MD;  Location: [facility];  Service: Gynecology;  Laterality: N/A;   INTRAUTERINE DEVICE (IUD) INSERTION N/A 12/14/2014   Procedure: INTRAUTERINE DEVICE (IUD) INSERTION;  Surgeon: Catalina Antigua, MD;  Location: [facility];  Service: Gynecology;  Laterality: N/A;   STONE EXTRACTION WITH BASKET Left 10/19/2014   Procedure: STONE EXTRACTION WITH BASKET;  Surgeon: Crist Fat, MD;  Location: [facility];  Service: Urology;  Laterality: Left;   WISDOM TOOTH EXTRACTION     Patient Active Problem List   Diagnosis Date Noted   Lumbar radiculopathy 07/17/2023   Acute cough 06/25/2023   Strep throat 06/18/2023   Adjustment disorder with anxious mood 05/16/2023   Primary osteoarthritis of right knee 05/15/2023   Right hip pain 05/15/2023   Spondylosis of lumbar spine 05/15/2023   Cervical spondylolysis 04/16/2023   Cervical sprain 04/16/2023   Encounter for screening for other metabolic disorders 04/16/2023   Lumbar sprain 04/16/2023   Left wrist sprain 04/16/2023   Haglund's deformity of left heel 04/16/2023   Derangement of left knee 04/16/2023   Vaginal discharge 04/16/2023   Intracranial hypertension 03/04/2023   Diabetes mellitus (HCC) 03/04/2023   Phase of life problem  03/04/2023   Primary insomnia 09/19/2022   Migraine without aura and without status migrainosus, not intractable 05/02/2020   Complex tear of lateral meniscus of left knee as current injury 12/22/2019   Iron overload 12/21/2019   Medication management contract signed 12/09/2019   Primary osteoarthritis of left knee 11/17/2019   Patellofemoral pain syndrome of left knee 09/08/2019   Dermatitis 12/18/2018   Vitamin D deficiency 09/25/2018   Absolute anemia 09/25/2018   Class 3 severe obesity with serious comorbidity and body mass index (BMI) of 50.0 to 59.9 in adult [facility]) 09/25/2018   IUD strings lost  09/24/2018   Thickened endometrium 07/20/2018   History of endometrial hyperplasia 07/20/2018   Morbid obesity with BMI of 50.0-59.9, adult (HCC) 07/20/2018   Abnormal uterine bleeding (AUB)    Hypertension 02/25/2012   Hx of Bell's palsy 02/25/2012    PCP: Raymond de Peru MD  REFERRING PROVIDER: Ranelle Oyster, MD  REFERRING DIAG: [phone] (ICD-10-CM) - Spondylosis of lumbar spine M54.16 (ICD-10-CM) - Lumbar radiculopathy Diagnosis: Chronic low back pain and radiculopathy RLE, L4   Rx: Eval and Treat. McKenzie technique, low back and core strengthening and lengthening  Rationale for Evaluation and Treatment: Rehabilitation  THERAPY DIAG:  Other low back pain  Other abnormalities of gait and mobility  Muscle weakness (generalized)  Other symptoms and signs involving the musculoskeletal system  Right knee pain, unspecified chronicity  ONSET DATE: 3 years  SUBJECTIVE:                                                                                                                                                                                           SUBJECTIVE STATEMENT: Patient states some issues with low back and symptoms in R hip and knee. She has been seeing pain management. She did aquatic therapy last year and it was getting real painful. She had to take a break due to pain and being out of work for a bit. Has tingling from low back/buttock and down into her toes and notices with standing, driving, walking. Some balance issues.   PERTINENT HISTORY:  Increased BMI, chronic pain, HTN, history of Bell's palsy, anemia, headaches, dyspnea with exertion   PAIN:  Are you having pain? Yes: NPRS scale: 7/10 Pain location: low back and R hip/thigh Pain description: tingling, throbbing Aggravating factors: standing, walking, driving Relieving factors: sitting sometimes, propping something under knee.  PRECAUTIONS: None  WEIGHT BEARING RESTRICTIONS: No  FALLS:  Has  patient fallen in last 6 months? 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 Redemonstrated right foraminal disc protrusion at L4-L5 which likely contacts the exiting right L4 nerve root, unchanged. 2. Moderate bilateral facet degenerative change at L3-L4 and L5-S1, unchanged.  MRI 06/06/23 R knee IMPRESSION: 1. Tricompartmental osteoarthritis, with substantial lateral marginal spurring of the patella. 2. Moderate knee effusion. 3. Thickened distal iliotibial band but without surrounding edema to further suggest iliotibial band syndrome.  PATIENT SURVEYS:  LEFS 14/80  SCREENING FOR RED FLAGS: Bowel or bladder incontinence: No Spinal tumors: No Cauda equina syndrome: No Compression fracture: No Abdominal aneurysm: No  COGNITION: Overall cognitive status: Within functional limits for tasks assessed     SENSATION: WFL   POSTURE: rounded shoulders and forward head  PALPATION: TTP R glutes/piriformis, R knee at joint line  LUMBAR ROM:   AROM eval  Flexion 0% limited   Extension 25% limited  Right lateral flexion 25% limited  Left lateral flexion 25% limited  Right rotation   Left rotation    (Blank rows = not tested) * = pain/symptoms  LOWER EXTREMITY ROM:   WFL for tasks assessed  Active  Right eval Left eval  Hip flexion    Hip extension    Hip abduction    Hip adduction    Hip internal rotation    Hip external rotation    Knee flexion    Knee extension    Ankle dorsiflexion    Ankle plantarflexion    Ankle inversion    Ankle eversion     (Blank rows = not tested) * = pain/symptoms  LOWER EXTREMITY MMT:    MMT Right eval Left eval  Hip flexion 42.8 47.9  Hip extension    Hip abduction 56.4 65.1  Hip  adduction    Hip internal rotation    Hip external rotation    Knee flexion 4- (seated) 4+ (seated)  Knee extension 46.2 76.6  Ankle dorsiflexion    Ankle plantarflexion    Ankle inversion    Ankle eversion     (Blank rows = not tested) * = pain/symptoms    FUNCTIONAL TESTS:  5 times sit to stand: 24. 46 seconds with some R UE support on chair 2 minute walk test: 328 feet  GAIT: Distance walked: 328 feet Assistive device utilized: None Level of assistance: Complete Independence Comments: ; antalgic on RLE with limited R knee flexion/extension, R knee pain, gradual increasing fatigue.   TODAY'S TREATMENT:                                                                                                                              DATE:  08/15/23  LAQ 1 x 10 with 5 second holds Hamstring isometrics 10 x 5-10 second holds   PATIENT EDUCATION:  Education details: Patient educated on exam findings, POC, scope of PT, HEP. Person educated: Patient Education method: Explanation,  Demonstration, and Handouts Education comprehension: verbalized understanding, returned demonstration, verbal cues required, and tactile cues required  HOME EXERCISE PROGRAM: Access Code: [email or web address] URL: [email or web address] Date: 08/15/2023 Prepared by: Greig Castilla Arnisha Laffoon  Exercises - Seated Long Arc Quad  - 2-3 x daily - 7 x weekly - 1-2 sets - 10 reps - 5 second hold - Seated Hamstring Set  - 2-3 x daily - 7 x weekly - 10 reps - 5-10 second hold  ASSESSMENT:  CLINICAL IMPRESSION: Patient a 43 y.o. y.o. female who was seen today for physical therapy evaluation and treatment for Chronic low back pain  and RLE pain. Patient presents with pain limited deficits in lumbar spine strength, ROM, endurance, activity tolerance, and functional mobility with ADL. Patient is having to modify and restrict ADL as indicated by outcome measure score as well as subjective information and objective  measures which is affecting overall participation. Patient will benefit from skilled physical therapy in order to improve function and reduce impairment.  OBJECTIVE IMPAIRMENTS: Abnormal gait, decreased activity tolerance, decreased balance, decreased endurance, decreased mobility, difficulty walking, decreased ROM, decreased strength, increased muscle spasms, impaired flexibility, improper body mechanics, postural dysfunction, obesity, and pain.   ACTIVITY LIMITATIONS: carrying, lifting, bending, standing, squatting, stairs, transfers, reach over head, hygiene/grooming, locomotion level, and caring for others  PARTICIPATION LIMITATIONS: meal prep, cleaning, laundry, driving, shopping, community activity, occupation, and yard work  PERSONAL FACTORS: Fitness, Time since onset of injury/illness/exacerbation, and 3+ comorbidities: Increased BMI, chronic pain, HTN  are also affecting patient's functional outcome.   REHAB POTENTIAL: Good  CLINICAL DECISION MAKING: Stable/uncomplicated  EVALUATION COMPLEXITY: Low   GOALS: Goals reviewed with patient? Yes  SHORT TERM GOALS: Target date: 09/12/2023    Patient will be independent with HEP in order to improve functional outcomes. Baseline:  Goal status: INITIAL  2.  Patient will report at least 25% improvement in symptoms for improved quality of life. Baseline:  Goal status: INITIAL   LONG TERM GOALS: Target date: 10/10/2023    Patient will report at least 75% improvement in symptoms for improved quality of life. Baseline:  Goal status: INITIAL  2.  Patient will improve LEFS score by at least 18 points in order to indicate improved tolerance to activity. Baseline: 14/80 Goal status: INITIAL  3.  Patient will be able to complete 5x STS in under 14 seconds in order to demonstrate improved functional strength. Baseline: 24. 46 seconds with some R UE support on chair Goal status: INITIAL  4.  Patient will be able to ambulate at least  400 feet in in order to demonstrate improved tolerance to activity. Baseline: 328 feet Goal status: INITIAL  5.  Patient will demonstrate grade of 5/5 MMT or 10lb increase in force in all tested musculature as evidence of improved strength to assist with stair ambulation and gait.   Baseline: see above Goal status: INITIAL     PLAN:  PT FREQUENCY: 1-2x/week  PT DURATION: 8 weeks starting in Feb 2025 due to scheduling  PLANNED INTERVENTIONS: 97164- PT Re-evaluation, 97110-Therapeutic exercises, 97530- Therapeutic activity, 97112- Neuromuscular re-education, 97535- Self Care, 54098- Manual therapy, [phone]- Gait training, [phone]- Orthotic Fit/training, [phone]- Canalith repositioning, [id number]- Aquatic Therapy, [phone]- Splinting, Patient/Family education, Balance training, Stair training, Taping, Dry Needling, Joint mobilization, Joint manipulation, Spinal manipulation, Spinal mobilization, Scar mobilization, and DME instructions.  PLAN FOR NEXT SESSION: core and postural strength, LE strength and progress as tolerated    Wyman Songster, PT 08/15/2023, 3:20 PM   For all  possible CPT codes, reference the Planned Interventions line above.     Check all conditions that are expected to impact treatment: {Conditions expected to impact treatment:Morbid obesity and Respiratory disorders   If treatment provided at initial evaluation, no treatment charged due to lack of authorization.

## 2023-08-19 ENCOUNTER — Other Ambulatory Visit (HOSPITAL_BASED_OUTPATIENT_CLINIC_OR_DEPARTMENT_OTHER): Payer: Self-pay | Admitting: Family Medicine

## 2023-08-19 ENCOUNTER — Encounter (HOSPITAL_BASED_OUTPATIENT_CLINIC_OR_DEPARTMENT_OTHER): Payer: Self-pay | Admitting: Family Medicine

## 2023-08-20 ENCOUNTER — Other Ambulatory Visit (HOSPITAL_BASED_OUTPATIENT_CLINIC_OR_DEPARTMENT_OTHER): Payer: Self-pay

## 2023-08-20 ENCOUNTER — Other Ambulatory Visit (HOSPITAL_BASED_OUTPATIENT_CLINIC_OR_DEPARTMENT_OTHER): Payer: Self-pay | Admitting: *Deleted

## 2023-08-20 MED ORDER — TRIAMCINOLONE ACETONIDE 0.1 % EX CREA
1.0000 | TOPICAL_CREAM | Freq: Two times a day (BID) | CUTANEOUS | 0 refills | Status: DC
  Filled 2023-08-20: qty 30, 28d supply, fill #0

## 2023-08-20 MED ORDER — FLUOCINOLONE ACETONIDE SCALP 0.01 % EX OIL
1.0000 | TOPICAL_OIL | CUTANEOUS | 1 refills | Status: DC
  Filled 2023-08-20: qty 118.28, 30d supply, fill #0

## 2023-08-20 MED ORDER — DOXAZOSIN MESYLATE 1 MG PO TABS
1.0000 mg | ORAL_TABLET | Freq: Every day | ORAL | 3 refills | Status: DC
  Filled 2023-08-20: qty 30, 30d supply, fill #0
  Filled 2023-09-23: qty 30, 30d supply, fill #1
  Filled 2023-10-27: qty 30, 30d supply, fill #2
  Filled 2023-11-26: qty 30, 30d supply, fill #3

## 2023-08-23 ENCOUNTER — Encounter (HOSPITAL_BASED_OUTPATIENT_CLINIC_OR_DEPARTMENT_OTHER): Payer: Self-pay

## 2023-08-26 ENCOUNTER — Other Ambulatory Visit: Payer: Self-pay

## 2023-08-26 ENCOUNTER — Other Ambulatory Visit (HOSPITAL_BASED_OUTPATIENT_CLINIC_OR_DEPARTMENT_OTHER): Payer: Self-pay

## 2023-08-26 ENCOUNTER — Encounter (HOSPITAL_BASED_OUTPATIENT_CLINIC_OR_DEPARTMENT_OTHER): Payer: Self-pay | Admitting: *Deleted

## 2023-08-26 ENCOUNTER — Ambulatory Visit (HOSPITAL_BASED_OUTPATIENT_CLINIC_OR_DEPARTMENT_OTHER): Payer: Managed Care, Other (non HMO) | Admitting: Family Medicine

## 2023-08-26 ENCOUNTER — Encounter (HOSPITAL_BASED_OUTPATIENT_CLINIC_OR_DEPARTMENT_OTHER): Payer: Self-pay | Admitting: Family Medicine

## 2023-08-26 VITALS — BP 152/94 | HR 91 | Temp 98.4°F | Ht 65.0 in | Wt 345.9 lb

## 2023-08-26 DIAGNOSIS — Z7985 Long-term (current) use of injectable non-insulin antidiabetic drugs: Secondary | ICD-10-CM

## 2023-08-26 DIAGNOSIS — G4700 Insomnia, unspecified: Secondary | ICD-10-CM

## 2023-08-26 DIAGNOSIS — I10 Essential (primary) hypertension: Secondary | ICD-10-CM

## 2023-08-26 DIAGNOSIS — E1165 Type 2 diabetes mellitus with hyperglycemia: Secondary | ICD-10-CM

## 2023-08-26 MED ORDER — TIRZEPATIDE 5 MG/0.5ML ~~LOC~~ SOAJ
5.0000 mg | SUBCUTANEOUS | 2 refills | Status: DC
  Filled 2023-08-26: qty 2, 28d supply, fill #0
  Filled 2023-10-06: qty 2, 28d supply, fill #1

## 2023-08-26 NOTE — Assessment & Plan Note (Signed)
Patient continues with glipizide and Mounjaro 2.5 mg dose.  She has been doing well with medication.  Has noted some nausea, GI upset, possible constipation related to medication.  Generally feels that she has been doing well with medication. Last hemoglobin A1c above goal at 7.9%.  This is about 6 weeks ago. Discussed options, we can proceed with titration of dose of Mounjaro.  She does have 3 more doses of current month supply of Mounjaro, she will complete these and then switch to 5 mg dose.  Discussed that when making that switch to 5 mg dose, can look to stop glipizide at that time. Will plan for follow-up in about 1 month to assess progress with new dose of medication or sooner as needed

## 2023-08-26 NOTE — Patient Instructions (Signed)
  Medication Instructions:  Your physician recommends that you continue on your current medications as directed. Please refer to the Current Medication list given to you today. --If you need a refill on any your medications before your next appointment, please call your pharmacy first. If no refills are authorized on file call the office.--   Follow-Up: Your next appointment:   Your physician recommends that you schedule a follow-up appointment in: 6 week follow up  with Dr. de Peru  You will receive a text message or e-mail with a link to a survey about your care and experience with [place] today! We would greatly appreciate your feedback!   Thanks for letting us be apart of your health journey!!  Primary Care and Sports Medicine   Dr. Ceasar Mons Peru   We encourage you to activate your patient portal called "MyChart".  Sign up information is provided on this After Visit Summary.  MyChart is used to connect with patients for Virtual Visits (Telemedicine).  Patients are able to view lab/test results, encounter notes, upcoming appointments, etc.  Non-urgent messages can be sent to your provider as well. To learn more about what you can do with MyChart, please visit --  [email or web address].

## 2023-08-26 NOTE — Progress Notes (Unsigned)
Cardiology Office Note:  .    Date:  08/27/2023  ID:  [name], DOB [birth year], MRN [id number] PCP: de Peru, Raymond J, MD  [facility] Providers Cardiologist:  Jodelle Red, MD     History of Present Illness: .    Molly Archer is a 43 y.o. female with a hx of hypertension, type 2 diabetes, arthritis, and obesity. I met her 05/21/23 for the evaluation of hypertension at the request of Dr. De Peru.  History: She was seen by her PCP 03/04/2023 and her blood pressure was 145/75 on a regimen of doxazosin, irbesartan-HCTZ, nifedipine. She reported a history of ICH and was also referred to neurology. Had prior sleep study which was negative for sleep apnea. Has a history of type II diabetes, obesity with BMI 55. Previously followed by cardiology at [facility], though no records are available in Care Everywhere.   Cardiovascular risk factors: Prior clinical ASCVD: None. Comorbid conditions: Hypertension - "up and down". Occasionally experiences intense headaches that may last for prolonged periods associated with high blood pressures, exacerbated by movement. Previously was intolerant of either amlodipine or losartan due to side effects of headaches. She started doxazosin within the past year; she did not feel any differently after starting this. She affirms a history of intracranial hypertension. Diabetes - diagnosed about 2 years ago. She had been prediabetic for years. Previously intolerant of Rybelsus. Family history: Mostly hypertension, diabetes. She has several relatives who require insulin. History of cancer predominantly in her maternal family.    Today: Overall doing well. Stress continues with work. Has been on Mounjaro for two months, dose upped yesterday. Has had issues with constipation, reviewed treatment recommendations.  Not regularly checking blood pressure at home. Has had a range of blood pressures in the office. Not checking BP at home regularly, reviewed  how to check today.  Saw neurologist a few weeks ago for headaches. Trialing some new meds.  Denies chest pain, shortness of breath at rest or with normal exertion. No PND, orthopnea, LE edema or unexpected weight gain. No syncope or palpitations. ROS otherwise negative except as noted.   ROS:  Please see the history of present illness. ROS otherwise negative except as noted.   Studies Reviewed: Marland Kitchen         Physical Exam:    VS:  BP 132/64 (Cuff Size: Large)   Pulse 91   Ht 5\' 5"  (1.651 m)   Wt (!) 347 lb 1.6 oz (157.4 kg)   LMP 07/20/2018 (Exact Date)   SpO2 95%   BMI 57.76 kg/m    Wt Readings from Last 3 Encounters:  08/27/23 (!) 347 lb 1.6 oz (157.4 kg)  08/26/23 (!) 345 lb 14.4 oz (156.9 kg)  07/30/23 (!) 351 lb 6.4 oz (159.4 kg)    GEN: Well nourished, well developed in no acute distress HEENT: Normal, moist mucous membranes NECK: No JVD CARDIAC: regular rhythm, normal S1 and S2, no rubs or gallops. 1/6 systolic murmur. VASCULAR: Radial and DP pulses 2+ bilaterally. No carotid bruits RESPIRATORY:  Clear to auscultation without rales, wheezing or rhonchi  ABDOMEN: Soft, non-tender, non-distended MUSCULOSKELETAL:  Ambulates independently SKIN: Warm and dry, no edema NEUROLOGIC:  Alert and oriented x 3. No focal neuro deficits noted. PSYCHIATRIC:  Normal affect   ASSESSMENT AND PLAN: .    Hypertension -improved on recheck -continue doxazosin (changing to bedtime), hydrochlorothiazide, irbesartan, nifedipine. Notes that she was on nifedipine 120 mg (high dose) in the past -had unclear  side effect with amlodipine -has been told both she does and does not have intracranial hypertension. Has not been on acetazolamide  Type II diabetes, with obesity, not on insulin BMI 57 -had gyn infection on Jardiance, stopped -on glipizide, hoping to be able to stop as she loses weight -started on Mounjaro 2 mos ago, dose upped yesterday. Reviewed what to watch for with GLP. Having  constipation, discussed management today -We discussed aspirin/statin recommendations today. She feels overwhelmed with her pill burden today. Will continue to discuss  CV risk counseling and prevention -recommend heart healthy/Mediterranean diet, with whole grains, fruits, vegetable, fish, lean meats, nuts, and olive oil. Limit salt. -recommend moderate walking, 3-5 times/week for 30-50 minutes each session. Aim for at least 150 minutes.week. Goal should be pace of 3 miles/hours, or walking 1.5 miles in 30 minutes -recommend avoidance of tobacco products. Avoid excess alcohol. -ASCVD risk score: The 10-year ASCVD risk score (Arnett DK, et al., 2019) is: 28.7%   Values used to calculate the score:     Age: 76 years     Sex: Female     Is Non-Hispanic African American: Yes     Diabetic: Yes     Tobacco smoker: No     Systolic Blood Pressure: 132 mmHg     Is BP treated: Yes     HDL Cholesterol: 24 MG/DL     Total Cholesterol: 218 MG/DL    Dispo: Follow-up in 6 months, or sooner as needed.  Signed, Jodelle Red, MD

## 2023-08-26 NOTE — Assessment & Plan Note (Signed)
Blood pressure borderline in office today.  Can continue with current regimen, she does have appointment tomorrow to establish with cardiology, encouraged to continue with this. No changes to medications today Recommend intermittent monitoring of blood pressure at home, DASH diet

## 2023-08-26 NOTE — Progress Notes (Signed)
    Procedures performed today:    None.  Independent interpretation of notes and tests performed by another provider:   None.  Brief History, Exam, Impression, and Recommendations:    BP (!) 160/86 (BP Location: Right Arm, Patient Position: Sitting, Cuff Size: Large)   Pulse 91   Temp 98.4 F (36.9 C) (Oral)   Ht 5\' 5"  (1.651 m)   Wt (!) 345 lb 14.4 oz (156.9 kg)   LMP 07/20/2018 (Exact Date)   SpO2 99%   BMI 57.56 kg/m   Type 2 diabetes mellitus with hyperglycemia, without long-term current use of insulin (HCC) Assessment & Plan: Patient continues with glipizide and Mounjaro 2.5 mg dose.  She has been doing well with medication.  Has noted some nausea, GI upset, possible constipation related to medication.  Generally feels that she has been doing well with medication. Last hemoglobin A1c above goal at 7.9%.  This is about 6 weeks ago. Discussed options, we can proceed with titration of dose of Mounjaro.  She does have 3 more doses of current month supply of Mounjaro, she will complete these and then switch to 5 mg dose.  Discussed that when making that switch to 5 mg dose, can look to stop glipizide at that time. Will plan for follow-up in about 1 month to assess progress with new dose of medication or sooner as needed  Orders: -     Tirzepatide; Inject 5 mg into the skin once a week.  Dispense: 2 mL; Refill: 2  Primary hypertension Assessment & Plan: Blood pressure borderline in office today.  Can continue with current regimen, she does have appointment tomorrow to establish with cardiology, encouraged to continue with this. No changes to medications today Recommend intermittent monitoring of blood pressure at home, DASH diet   Early awakening Assessment & Plan: Patient with history of insomnia, has been utilizing Ambien 5 mg for control of symptoms, however she reports that she we will still find herself waking up during the night with difficulty falling back asleep.   She reports that she has had prior evaluation for sleep apnea which has been negative in the past, however does have notable risk factors for sleep apnea with observed nighttime awakenings, daytime fatigue. We discussed options, patient amenable to referral to sleep medicine specialist, referral placed today  Orders: -     Ambulatory referral to Neurology  Return in about 6 weeks (around 10/07/2023) for diabetes, med check.  Completed form for handicap parking decal while patient was in office today.   ___________________________________________ Lakeya Mulka de Peru, MD, ABFM, [facility] Primary Care and Sports Medicine [facility]

## 2023-08-26 NOTE — Assessment & Plan Note (Signed)
Patient with history of insomnia, has been utilizing Ambien 5 mg for control of symptoms, however she reports that she we will still find herself waking up during the night with difficulty falling back asleep.  She reports that she has had prior evaluation for sleep apnea which has been negative in the past, however does have notable risk factors for sleep apnea with observed nighttime awakenings, daytime fatigue. We discussed options, patient amenable to referral to sleep medicine specialist, referral placed today

## 2023-08-27 ENCOUNTER — Encounter (HOSPITAL_BASED_OUTPATIENT_CLINIC_OR_DEPARTMENT_OTHER): Payer: Self-pay | Admitting: Cardiology

## 2023-08-27 ENCOUNTER — Ambulatory Visit (INDEPENDENT_AMBULATORY_CARE_PROVIDER_SITE_OTHER): Payer: Managed Care, Other (non HMO) | Admitting: Cardiology

## 2023-08-27 ENCOUNTER — Encounter (HOSPITAL_BASED_OUTPATIENT_CLINIC_OR_DEPARTMENT_OTHER): Payer: Self-pay

## 2023-08-27 ENCOUNTER — Ambulatory Visit: Payer: 59 | Admitting: Licensed Clinical Social Worker

## 2023-08-27 VITALS — BP 132/64 | HR 91 | Ht 65.0 in | Wt 347.1 lb

## 2023-08-27 DIAGNOSIS — K5900 Constipation, unspecified: Secondary | ICD-10-CM

## 2023-08-27 DIAGNOSIS — I10 Essential (primary) hypertension: Secondary | ICD-10-CM

## 2023-08-27 DIAGNOSIS — Z6841 Body Mass Index (BMI) 40.0 and over, adult: Secondary | ICD-10-CM

## 2023-08-27 DIAGNOSIS — Z7189 Other specified counseling: Secondary | ICD-10-CM | POA: Diagnosis not present

## 2023-08-27 DIAGNOSIS — F4322 Adjustment disorder with anxiety: Secondary | ICD-10-CM

## 2023-08-27 DIAGNOSIS — E119 Type 2 diabetes mellitus without complications: Secondary | ICD-10-CM | POA: Diagnosis not present

## 2023-08-27 LAB — MICROALBUMIN / CREATININE URINE RATIO
Creatinine, Urine: 123.5 mg/dL
Microalb/Creat Ratio: 17 mg/g{creat} (ref 0–29)
Microalbumin, Urine: 20.7 ug/mL

## 2023-08-27 NOTE — Patient Instructions (Addendum)
Medication Instructions:  Your physician recommends that you continue on your current medications as directed. Please refer to the Current Medication list given to you today.  TAKE Doxazosin at bedtime  For constipation: Level one: increase hydration and fiber (either in diet or Metamucil-type supplement) Level two: add Miralax (polyethylene glycol, generic ok) as needed or daily to above Level three: do the above (need a softener before a squeezer), then add senna or bisacodyl as needed (don't do daily, your body will become dependent)  Follow-Up: At [facility], you and your health needs are our priority.  As part of our continuing mission to provide you with exceptional heart care, we have created designated Provider Care Teams.  These Care Teams include your primary Cardiologist (physician) and Advanced Practice Providers (APPs -  Physician Assistants and Nurse Practitioners) who all work together to provide you with the care you need, when you need it.  We recommend signing up for the patient portal called "MyChart".  Sign up information is provided on this After Visit Summary.  MyChart is used to connect with patients for Virtual Visits (Telemedicine).  Patients are able to view lab/test results, encounter notes, upcoming appointments, etc.  Non-urgent messages can be sent to your provider as well.   To learn more about what you can do with MyChart, go to [email or web address].    Your next appointment:   6 month(s)  Provider:   Jodelle Red, MD    Other Instructions    how to check blood pressure:  -sit comfortably in a chair, feet uncrossed and flat on floor, for 5-10 minutes  -arm ideally should rest at the level of the heart. However, arm should be relaxed and not tense (for example, do not hold the arm up unsupported)  -avoid exercise, caffeine, and tobacco for at least 30 minutes prior to BP reading  -don't take BP cuff reading over clothes (always place  on skin directly)  -I prefer to know how well the medication is working, so I would like you to take your readings 1-2 hours after taking your blood pressure medication if possible

## 2023-08-27 NOTE — Progress Notes (Signed)
[name] Progress Note  Patient ID: Molly Archer, MRN: [id number]    Date: 08/27/23  Time Spent: 0403  pm - 0448 pm : 45 Minutes  Treatment Type: Individual Therapy.  Reported Symptoms: anxiety related to social interaction and church relationships and work   Mental Status Exam: Appearance:  Casual     Behavior: Appropriate  Motor: Normal  Speech/Language:  Clear and Coherent  Affect: Appropriate  Mood: normal  Thought process: normal  Thought content:   WNL  Sensory/Perceptual disturbances:   WNL  Orientation: oriented to person, place, time/date, situation, day of week, month of year, and year  Attention: Good  Concentration: Good  Memory: WNL  Fund of knowledge:  Good  Insight:   Good  Judgment:  Good  Impulse Control: Good    Risk Assessment: Danger to Self:  No Self-injurious Behavior: No Danger to Others: No Duty to Warn:no Physical Aggression / Violence:No  Access to Firearms a concern: No  Gang Involvement:No    Subjective:    Molly Archer participated from home, via video, patient was aware of risk and limitations, and consented to treatment. Therapist participated from office. We met online due to patient request.   Molly Archer presented for her session identifying a busy day at work and a stressful couple of weeks. She reports that many of their immigrant parents won't send their children to school due to fear. She states that she keeps getting more and more work added to her already piled up responsibilities. Patient states that work is a large source of stress. She also shared that her father called her on Sunday and was saying she should come pick him up and she should do this because she has a car and so forth. Patient states that her father has never been a part of her life and she gets very upset when he tries to manipulate her. We processed how this affects he ran dhow she had a headache after their conversation.  Clinician encouraged patient to read the book, Good Boundaries and Goodbyes by Len Blalock.  Clinician identified how it was recommended to her and how it was very helpful in setting clear boundaries and not feeling guilty.   Clinician processed with patient additional coping skills  such as making time for herself and recognizing what is good for her and what robs her of her joy and peace. We discussed visiting various churches and ministries, community involvement as well as allowing herself to be vulnerable in making friends and doing things with those friends. Patient will continue with biweekly therapy sessions utilizing CBT techniques including cognitive restructuring, and relaxation techniques. Treatment plan to be reviewed by 05/15/2024.   Goals include gradually increasing exposure to social situations, practicing social skills like eye contact and conversation initiation, and utilizing coping mechanisms like deep breathing, with the aim of building confidence and reducing anxiety in social settings; examples include attending one social event per month, initiating a conversation with a new person each week, or making small talk with a Conservation officer, nature at the store.   Interventions: Cognitive Behavioral Therapy  Diagnosis: Adjustment Disorder with Anxious Mood    [name] MSW, LCSW/DATE 08/27/2023

## 2023-08-28 ENCOUNTER — Encounter (HOSPITAL_BASED_OUTPATIENT_CLINIC_OR_DEPARTMENT_OTHER): Payer: Self-pay | Admitting: Family Medicine

## 2023-09-02 ENCOUNTER — Other Ambulatory Visit: Payer: Self-pay | Admitting: Physical Medicine & Rehabilitation

## 2023-09-02 DIAGNOSIS — M47816 Spondylosis without myelopathy or radiculopathy, lumbar region: Secondary | ICD-10-CM

## 2023-09-02 DIAGNOSIS — M1711 Unilateral primary osteoarthritis, right knee: Secondary | ICD-10-CM

## 2023-09-06 ENCOUNTER — Other Ambulatory Visit (HOSPITAL_BASED_OUTPATIENT_CLINIC_OR_DEPARTMENT_OTHER): Payer: Self-pay

## 2023-09-06 ENCOUNTER — Encounter: Payer: Self-pay | Admitting: Physical Medicine & Rehabilitation

## 2023-09-06 MED ORDER — OXYCODONE HCL 10 MG PO TABS
10.0000 mg | ORAL_TABLET | Freq: Three times a day (TID) | ORAL | 0 refills | Status: DC | PRN
  Filled 2023-09-06: qty 75, 25d supply, fill #0

## 2023-09-07 ENCOUNTER — Other Ambulatory Visit (HOSPITAL_BASED_OUTPATIENT_CLINIC_OR_DEPARTMENT_OTHER): Payer: Self-pay

## 2023-09-07 ENCOUNTER — Encounter (HOSPITAL_BASED_OUTPATIENT_CLINIC_OR_DEPARTMENT_OTHER): Payer: Self-pay | Admitting: Family Medicine

## 2023-09-09 ENCOUNTER — Encounter (HOSPITAL_BASED_OUTPATIENT_CLINIC_OR_DEPARTMENT_OTHER): Payer: Self-pay | Admitting: Physical Therapy

## 2023-09-09 ENCOUNTER — Ambulatory Visit (INDEPENDENT_AMBULATORY_CARE_PROVIDER_SITE_OTHER): Payer: Managed Care, Other (non HMO) | Admitting: Family Medicine

## 2023-09-09 ENCOUNTER — Encounter (HOSPITAL_BASED_OUTPATIENT_CLINIC_OR_DEPARTMENT_OTHER): Payer: Self-pay | Admitting: Family Medicine

## 2023-09-09 ENCOUNTER — Other Ambulatory Visit (HOSPITAL_BASED_OUTPATIENT_CLINIC_OR_DEPARTMENT_OTHER): Payer: Self-pay

## 2023-09-09 ENCOUNTER — Encounter (HOSPITAL_BASED_OUTPATIENT_CLINIC_OR_DEPARTMENT_OTHER): Payer: Self-pay | Admitting: *Deleted

## 2023-09-09 ENCOUNTER — Ambulatory Visit: Payer: Managed Care, Other (non HMO) | Admitting: Licensed Clinical Social Worker

## 2023-09-09 ENCOUNTER — Encounter (HOSPITAL_BASED_OUTPATIENT_CLINIC_OR_DEPARTMENT_OTHER): Payer: Self-pay

## 2023-09-09 VITALS — BP 114/68 | HR 95 | Ht 65.0 in | Wt 342.0 lb

## 2023-09-09 DIAGNOSIS — E119 Type 2 diabetes mellitus without complications: Secondary | ICD-10-CM | POA: Diagnosis not present

## 2023-09-09 DIAGNOSIS — J329 Chronic sinusitis, unspecified: Secondary | ICD-10-CM | POA: Insufficient documentation

## 2023-09-09 MED ORDER — DOXYCYCLINE HYCLATE 100 MG PO TABS
100.0000 mg | ORAL_TABLET | Freq: Two times a day (BID) | ORAL | 0 refills | Status: AC
  Filled 2023-09-09: qty 14, 7d supply, fill #0

## 2023-09-09 NOTE — Patient Instructions (Signed)
  Medication Instructions:  Your physician recommends that you continue on your current medications as directed. Please refer to the Current Medication list given to you today. --If you need a refill on any your medications before your next appointment, please call your pharmacy first. If no refills are authorized on file call the office.--   Follow-Up: Your next appointment:   Your physician recommends that you schedule a follow-up appointment in: as needed  with Dr. de Peru  You will receive a text message or e-mail with a link to a survey about your care and experience with us  today! We would greatly appreciate your feedback!   Thanks for letting us  be apart of your health journey!!  Primary Care and Sports Medicine   Dr. Court Distance Peru   We encourage you to activate your patient portal called "MyChart".  Sign up information is provided on this After Visit Summary.  MyChart is used to connect with patients for Virtual Visits (Telemedicine).  Patients are able to view lab/test results, encounter notes, upcoming appointments, etc.  Non-urgent messages can be sent to your provider as well. To learn more about what you can do with MyChart, please visit --  [email or web address].   As

## 2023-09-09 NOTE — Assessment & Plan Note (Signed)
 Patient had recent visit to urgent care few days ago due to acute illness.  Was diagnosed with viral infection at that time and provided with medications to help with controlling symptoms.  Unfortunately, she has continued to have ongoing symptoms of cough, body aches, fatigue.  She was having fevers, but these have resolved at present.  She has felt the symptoms have gotten little bit worse in recent days.  Has some mild shortness of breath with activity.  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 On exam, patient is in no acute distress, vital signs stable.  Cardiovascular exam with regular rate and rhythm, lungs clear to auscultation bilaterally.  Mild pharyngeal erythema, no tender cervical lymphadenopathy. Given symptom progression, duration, would be reasonable to treat with antibiotics at this time given ongoing symptoms and recent worsening in symptoms.  Given penicillin allergy, can treat with doxycycline .  Instructed on proper use, potential side effects. Can continue with conservative measures to help with controlling symptoms, OTC medications.  Discussed ER precautions, particularly if noticing worsening shortness of breath or trouble breathing.  Low threshold for ED evaluation given comorbidities.

## 2023-09-09 NOTE — Progress Notes (Signed)
    Procedures performed today:    None.  Independent interpretation of notes and tests performed by another provider:   None.  Brief History, Exam, Impression, and Recommendations:    BP (!) 158/80 (BP Location: Left Arm, Patient Position: Sitting, Cuff Size: Large)   Pulse 95   Ht 5\' 5"  (1.651 m)   Wt (!) 342 lb (155.1 kg)   LMP 07/20/2018 (Exact Date)   SpO2 100%   BMI 56.91 kg/m   Diabetes mellitus without complication (HCC) -     Ambulatory referral to Ophthalmology  Sinusitis, unspecified chronicity, unspecified location Assessment & Plan: Patient had recent visit to urgent care few days ago due to acute illness.  Was diagnosed with viral infection at that time and provided with medications to help with controlling symptoms.  Unfortunately, she has continued to have ongoing symptoms of cough, body aches, fatigue.  She was having fevers, but these have resolved at present.  She has felt the symptoms have gotten little bit worse in recent days.  Has some mild shortness of breath with activity.  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 On exam, patient is in no acute distress, vital signs stable.  Cardiovascular exam with regular rate and rhythm, lungs clear to auscultation bilaterally.  Mild pharyngeal erythema, no tender cervical lymphadenopathy. Given symptom progression, duration, would be reasonable to treat with antibiotics at this time given ongoing symptoms and recent worsening in symptoms.  Given penicillin allergy, can treat with doxycycline .  Instructed on proper use, potential side effects. Can continue with conservative measures to help with controlling symptoms, OTC medications.  Discussed ER precautions, particularly if noticing worsening shortness of breath or trouble breathing.  Low threshold for ED evaluation given comorbidities.   Other orders -     Doxycycline  Hyclate; Take 1 tablet (100 mg total)  by mouth 2 (two) times daily for 7 days.  Dispense: 14 tablet; Refill: 0  Return if symptoms worsen or fail to improve.   ___________________________________________ Molly Mcandrew de Peru, MD, ABFM, CAQSM Primary Care and Sports Medicine [facility]

## 2023-09-10 ENCOUNTER — Encounter (HOSPITAL_BASED_OUTPATIENT_CLINIC_OR_DEPARTMENT_OTHER): Payer: Self-pay

## 2023-09-10 ENCOUNTER — Ambulatory Visit (HOSPITAL_BASED_OUTPATIENT_CLINIC_OR_DEPARTMENT_OTHER): Payer: Medicaid Other | Admitting: Physical Therapy

## 2023-09-11 ENCOUNTER — Telehealth (HOSPITAL_BASED_OUTPATIENT_CLINIC_OR_DEPARTMENT_OTHER): Payer: Self-pay | Admitting: *Deleted

## 2023-09-11 NOTE — Telephone Encounter (Signed)
Copied from CRM [id number]. Topic: General - Other >> Sep 11, 2023  1:52 PM Antony Haste wrote: Reason for CRM: PT states during her last visit with Dr. De Peru on 02/10, she was advised to contact him back if another doctor's note was needed. She is requesting a doctor's note for Thursday on 02/13. She states she does have access to MyChart if it could be uploaded there as well?

## 2023-09-11 NOTE — Telephone Encounter (Signed)
This message was sent via mychart see patient message I am awaiting provider to respond and we will respond to pt via mychart. Please let her know.

## 2023-09-12 ENCOUNTER — Ambulatory Visit (HOSPITAL_BASED_OUTPATIENT_CLINIC_OR_DEPARTMENT_OTHER): Payer: Medicaid Other | Attending: Physical Medicine & Rehabilitation | Admitting: Physical Therapy

## 2023-09-12 ENCOUNTER — Encounter (HOSPITAL_BASED_OUTPATIENT_CLINIC_OR_DEPARTMENT_OTHER): Payer: Self-pay | Admitting: Physical Therapy

## 2023-09-12 DIAGNOSIS — M25561 Pain in right knee: Secondary | ICD-10-CM | POA: Insufficient documentation

## 2023-09-12 DIAGNOSIS — R29898 Other symptoms and signs involving the musculoskeletal system: Secondary | ICD-10-CM | POA: Diagnosis present

## 2023-09-12 DIAGNOSIS — M6281 Muscle weakness (generalized): Secondary | ICD-10-CM | POA: Insufficient documentation

## 2023-09-12 DIAGNOSIS — M5459 Other low back pain: Secondary | ICD-10-CM | POA: Diagnosis present

## 2023-09-12 DIAGNOSIS — R2689 Other abnormalities of gait and mobility: Secondary | ICD-10-CM | POA: Diagnosis present

## 2023-09-12 NOTE — Telephone Encounter (Unsigned)
Copied from CRM [id number]. Topic: Clinical - Medication Question >> Sep 12, 2023  8:37 AM Gildardo Pounds wrote: Reason for CRM: Patient is not responding well to the doxycycline (VIBRA-TABS) 100 MG tablet prescribed. Patient is requesting a different antibiotic be prescribed. Callback number is [phone]

## 2023-09-12 NOTE — Therapy (Signed)
OUTPATIENT PHYSICAL THERAPY THORACOLUMBAR TREATMENT   Patient Name: Molly Archer MRN: [id number] DOB:[birth year], 43 y.o., female Today's Date: 09/13/2023  END OF SESSION:  PT End of Session - 09/12/23 1548     Visit Number 2    Number of Visits 16    Date for PT Re-Evaluation 10/24/23    Authorization Type [place] MEDICAID [employer]    PT Start Time 1544    PT Stop Time 1617    PT Time Calculation (min) 33 min    Activity Tolerance Patient tolerated treatment well    Behavior During Therapy [facility] for tasks assessed/performed             Past Medical History:  Diagnosis Date   Anemia    Asthma    only uses inhaler in fall and spring   Constipation    GERD (gastroesophageal reflux disease)    diet controlled, occ. uses omeprazole   Headache    otc med prn   History of kidney stones    Hx of Bell's palsy June 2013   Hypertension    currently no meds   Menometrorrhagia    Prediabetes    Psoriasis    Shortness of breath dyspnea    with exercise/exertion   Vitamin D deficiency    Past Surgical History:  Procedure Laterality Date   COMBINED HYSTEROSCOPY DIAGNOSTIC / D&C N/A 2011   Benign secretory endometrium   CYSTOSCOPY WITH STENT PLACEMENT Left 10/19/2014   Procedure: CYSTOSCOPY WITH STENT PLACEMENT;  Surgeon: Crist Fat, MD;  Location: [facility];  Service: Urology;  Laterality: Left;   DILATION AND CURETTAGE OF UTERUS  10/2003   Polyp, mild atypia, simple and complex hyperplasia,   DILATION AND CURETTAGE OF UTERUS  11/2003   Simple and complex hyperplasia   DILATION AND CURETTAGE OF UTERUS  2011   DILATION AND CURETTAGE OF UTERUS N/A 12/14/2014   Procedure: DILATATION AND CURETTAGE;  Surgeon: Catalina Antigua, MD;  Location: [facility];  Service: Gynecology;  Laterality: N/A;   HOLMIUM LASER APPLICATION Left 10/19/2014   Procedure: HOLMIUM LASER APPLICATION;  Surgeon: Crist Fat, MD;  Location: [facility];  Service: Urology;  Laterality: Left;    HYSTEROSCOPY WITH D & C N/A 07/21/2018   Procedure: DILATATION AND CURETTAGE /HYSTEROSCOPY;  Surgeon: Adam Phenix, MD;  Location: [facility];  Service: Gynecology;  Laterality: N/A;   INTRAUTERINE DEVICE (IUD) INSERTION N/A 12/14/2014   Procedure: INTRAUTERINE DEVICE (IUD) INSERTION;  Surgeon: Catalina Antigua, MD;  Location: [facility];  Service: Gynecology;  Laterality: N/A;   STONE EXTRACTION WITH BASKET Left 10/19/2014   Procedure: STONE EXTRACTION WITH BASKET;  Surgeon: Crist Fat, MD;  Location: [facility];  Service: Urology;  Laterality: Left;   WISDOM TOOTH EXTRACTION     Patient Active Problem List   Diagnosis Date Noted   Sinusitis 09/09/2023   Early awakening 08/26/2023   Lumbar radiculopathy 07/17/2023   Acute cough 06/25/2023   Strep throat 06/18/2023   Adjustment disorder with anxious mood 05/16/2023   Primary osteoarthritis of right knee 05/15/2023   Right hip pain 05/15/2023   Spondylosis of lumbar spine 05/15/2023   Cervical spondylolysis 04/16/2023   Cervical sprain 04/16/2023   Encounter for screening for other metabolic disorders 04/16/2023   Lumbar sprain 04/16/2023   Left wrist sprain 04/16/2023   Haglund's deformity of left heel 04/16/2023   Derangement of left knee 04/16/2023   Vaginal discharge 04/16/2023   Intracranial hypertension 03/04/2023   Diabetes  mellitus (HCC) 03/04/2023   Phase of life problem 03/04/2023   Primary insomnia 09/19/2022   Migraine without aura and without status migrainosus, not intractable 05/02/2020   Complex tear of lateral meniscus of left knee as current injury 12/22/2019   Iron overload 12/21/2019   Medication management contract signed 12/09/2019   Primary osteoarthritis of left knee 11/17/2019   Patellofemoral pain syndrome of left knee 09/08/2019   Dermatitis 12/18/2018   Vitamin D deficiency 09/25/2018   Absolute anemia 09/25/2018   Class 3 severe obesity with serious comorbidity and body mass index (BMI) of 50.0 to 59.9  in adult [facility]) 09/25/2018   IUD strings lost 09/24/2018   Thickened endometrium 07/20/2018   History of endometrial hyperplasia 07/20/2018   Morbid obesity with BMI of 50.0-59.9, adult (HCC) 07/20/2018   Abnormal uterine bleeding (AUB)    Hypertension 02/25/2012   Hx of Bell's palsy 02/25/2012    PCP: Raymond de Peru MD  REFERRING PROVIDER: Ranelle Oyster, MD  REFERRING DIAG: [phone] (ICD-10-CM) - Spondylosis of lumbar spine M54.16 (ICD-10-CM) - Lumbar radiculopathy Diagnosis: Chronic low back pain and radiculopathy RLE, L4   Rx: Eval and Treat. McKenzie technique, low back and core strengthening and lengthening  Rationale for Evaluation and Treatment: Rehabilitation  THERAPY DIAG:  Right knee pain, unspecified chronicity  Other low back pain  Other abnormalities of gait and mobility  Muscle weakness (generalized)  Other symptoms and signs involving the musculoskeletal system  ONSET DATE: 3 years  SUBJECTIVE:                                                                                                                                                                                           SUBJECTIVE STATEMENT: Patient has been sick recently and hasn't done her exercises.  Pt was performing her HEP prior to being sick.  Pt wearing mask today.  Pt states her back has been bothering her more than her knee.  Pt has R hip pain which travels down to knee and can travel to her foot when driving.  Pt has increased pain with walking and standing.  The pain is typically from her R hip to R knee though can be in her back with walking and standing.  Pt denies any adverse effects after prior Rx.      PERTINENT HISTORY:  Increased BMI, chronic pain, HTN, history of Bell's palsy, anemia, headaches, dyspnea with exertion   PAIN:  Are you having pain? Yes: NPRS scale: 6/10  /  0/10  Pain location: central lower thoracic/upper lumbar  /  R knee Pain description: tingling,  throbbing Aggravating factors: standing,  walking, driving Relieving factors: sitting sometimes, propping something under knee.  PRECAUTIONS: None  WEIGHT BEARING RESTRICTIONS: No  FALLS:  Has patient fallen in last 6 months? 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 Redemonstrated right foraminal disc protrusion at L4-L5 which likely contacts the exiting right L4 nerve root, unchanged. 2. Moderate bilateral facet degenerative change at L3-L4 and L5-S1, unchanged.  MRI 06/06/23 R knee IMPRESSION: 1. Tricompartmental osteoarthritis, with substantial lateral marginal spurring of the patella. 2. Moderate knee effusion. 3. Thickened distal iliotibial band but without surrounding edema to further suggest iliotibial band syndrome.   TODAY'S TREATMENT:                                                                                                                               Reviewed current function, pain levels, response to prior Rx, and HEP compliance.   Reviewed HEP LAQ 2 x 10  Hamstring isometrics 10 x 5-10 second holds  Prone lying x 2 mins Prone prop  up 2x1 min  Seated TrA contraction   PATIENT EDUCATION:  Education details: PT used spinal model to educated pt concerning relevant anatomy and rationale of Mckenzie exercises.  Exercise form, HEP, and POC. Person educated: Patient Education method: Explanation, Demonstration, and Handouts Education comprehension: verbalized understanding, returned demonstration, verbal cues required, and tactile cues required  HOME EXERCISE PROGRAM: Access Code: [email or web address] URL: [email or web address] Date: 08/15/2023 Prepared by: Greig Castilla Zaunegger  Exercises - Seated Long Arc Quad   - 2-3 x daily - 7 x weekly - 1-2 sets - 10 reps - 5 second hold - Seated Hamstring Set  - 2-3 x daily - 7 x weekly - 10 reps - 5-10 second hold  ASSESSMENT:  CLINICAL IMPRESSION: Patient has been sick recently and hasn't been performing HEP due to sickness.  PT asked pt if she felt ok to perform PT today or wanted to reschedule.  Pt stated she wanted to do PT today.  PT reviewed HEP and pt performed HEP.  She required cuing for correct form with seated HS isometric.  Pt's back has been bothering her more than her knee.  PT had pt perform prone lying and prone prop up.  She felt tightness, but had no increased pain in prone lying.  Pt felt better with prone prop up, but had no change in pain.  Pt reports pain may be a little better after Rx in her back, maybe a 5/10 (6/10 at beginning of Rx).  Pt reports no change in knee pain after Rx, still 0/10.  She should benefit from skilled PT to address impairments and goals and improve overall function.   OBJECTIVE IMPAIRMENTS: Abnormal gait, decreased activity tolerance, decreased balance, decreased endurance, decreased mobility, difficulty walking, decreased ROM,  decreased strength, increased muscle spasms, impaired flexibility, improper body mechanics, postural dysfunction, obesity, and pain.   ACTIVITY LIMITATIONS: carrying, lifting, bending, standing, squatting, stairs, transfers, reach over head, hygiene/grooming, locomotion level, and caring for others  PARTICIPATION LIMITATIONS: meal prep, cleaning, laundry, driving, shopping, community activity, occupation, and yard work  PERSONAL FACTORS: Fitness, Time since onset of injury/illness/exacerbation, and 3+ comorbidities: Increased BMI, chronic pain, HTN  are also affecting patient's functional outcome.   REHAB POTENTIAL: Good  CLINICAL DECISION MAKING: Stable/uncomplicated  EVALUATION COMPLEXITY: Low   GOALS: Goals reviewed with patient? Yes  SHORT TERM GOALS: Target date:  09/12/2023    Patient will be independent with HEP in order to improve functional outcomes. Baseline:  Goal status: INITIAL  2.  Patient will report at least 25% improvement in symptoms for improved quality of life. Baseline:  Goal status: INITIAL   LONG TERM GOALS: Target date: 10/10/2023    Patient will report at least 75% improvement in symptoms for improved quality of life. Baseline:  Goal status: INITIAL  2.  Patient will improve LEFS score by at least 18 points in order to indicate improved tolerance to activity. Baseline: 14/80 Goal status: INITIAL  3.  Patient will be able to complete 5x STS in under 14 seconds in order to demonstrate improved functional strength. Baseline: 24. 46 seconds with some R UE support on chair Goal status: INITIAL  4.  Patient will be able to ambulate at least 400 feet in in order to demonstrate improved tolerance to activity. Baseline: 328 feet Goal status: INITIAL  5.  Patient will demonstrate grade of 5/5 MMT or 10lb increase in force in all tested musculature as evidence of improved strength to assist with stair ambulation and gait.   Baseline: see above Goal status: INITIAL     PLAN:  PT FREQUENCY: 1-2x/week  PT DURATION: 8 weeks starting in Feb 2025 due to scheduling  PLANNED INTERVENTIONS: 97164- PT Re-evaluation, 97110-Therapeutic exercises, 97530- Therapeutic activity, 97112- Neuromuscular re-education, 97535- Self Care, 16109- Manual therapy, [phone]- Gait training, [phone]- Orthotic Fit/training, [phone]- Canalith repositioning, [id number]- Aquatic Therapy, [phone]- Splinting, Patient/Family education, Balance training, Stair training, Taping, Dry Needling, Joint mobilization, Joint manipulation, Spinal manipulation, Spinal mobilization, Scar mobilization, and DME instructions.  PLAN FOR NEXT SESSION: core and postural strength, LE strength and progress as tolerated.  Cont with land and aquatic therapy.     Audie Clear III PT,  DPT 09/13/23 3:17 PM

## 2023-09-13 ENCOUNTER — Encounter (HOSPITAL_BASED_OUTPATIENT_CLINIC_OR_DEPARTMENT_OTHER): Payer: Self-pay | Admitting: Physical Therapy

## 2023-09-13 NOTE — Telephone Encounter (Unsigned)
Copied from CRM [id number]. Topic: Clinical - Medication Question >> Sep 13, 2023  1:13 PM Gildardo Pounds wrote: Reason for CRM: Antibiotic made her sick. Can you prescribe something different than doxycycline (VIBRA-TABS) 100 MG tablet? the antibiotic made her sick. she responds best to the z pack per patient. Callback number is [phone]

## 2023-09-15 ENCOUNTER — Other Ambulatory Visit (HOSPITAL_BASED_OUTPATIENT_CLINIC_OR_DEPARTMENT_OTHER): Payer: Self-pay | Admitting: Family Medicine

## 2023-09-16 ENCOUNTER — Other Ambulatory Visit (HOSPITAL_BASED_OUTPATIENT_CLINIC_OR_DEPARTMENT_OTHER): Payer: Self-pay

## 2023-09-16 ENCOUNTER — Encounter (HOSPITAL_BASED_OUTPATIENT_CLINIC_OR_DEPARTMENT_OTHER): Payer: Self-pay | Admitting: *Deleted

## 2023-09-16 ENCOUNTER — Other Ambulatory Visit (HOSPITAL_BASED_OUTPATIENT_CLINIC_OR_DEPARTMENT_OTHER): Payer: Self-pay | Admitting: *Deleted

## 2023-09-16 DIAGNOSIS — Z309 Encounter for contraceptive management, unspecified: Secondary | ICD-10-CM

## 2023-09-16 MED ORDER — CLOBETASOL PROPIONATE 0.05 % EX OINT
TOPICAL_OINTMENT | Freq: Two times a day (BID) | CUTANEOUS | 1 refills | Status: DC
  Filled 2023-09-16: qty 30, 14d supply, fill #0
  Filled 2023-10-06: qty 30, 14d supply, fill #1

## 2023-09-16 NOTE — Telephone Encounter (Signed)
Patient went to urgent care on 2/15 was given z pack and prednisone told bronchitis and ear infection plus dehydration. She is feeling better but went ahead and schedule follow up for being at urgent care appt scheduled

## 2023-09-16 NOTE — Telephone Encounter (Signed)
Extended note provided follow up appt from urgent care scheduled pt is feeling better

## 2023-09-17 ENCOUNTER — Encounter (HOSPITAL_BASED_OUTPATIENT_CLINIC_OR_DEPARTMENT_OTHER): Payer: Self-pay | Admitting: Physical Therapy

## 2023-09-17 ENCOUNTER — Ambulatory Visit (HOSPITAL_BASED_OUTPATIENT_CLINIC_OR_DEPARTMENT_OTHER): Payer: Medicaid Other | Admitting: Physical Therapy

## 2023-09-17 DIAGNOSIS — M25561 Pain in right knee: Secondary | ICD-10-CM

## 2023-09-17 DIAGNOSIS — M6281 Muscle weakness (generalized): Secondary | ICD-10-CM

## 2023-09-17 DIAGNOSIS — M5459 Other low back pain: Secondary | ICD-10-CM

## 2023-09-17 DIAGNOSIS — R2689 Other abnormalities of gait and mobility: Secondary | ICD-10-CM

## 2023-09-17 NOTE — Therapy (Signed)
OUTPATIENT PHYSICAL THERAPY THORACOLUMBAR TREATMENT   Patient Name: Molly Archer MRN: [id number] DOB:[birth year], 43 y.o., female Today's Date: 09/17/2023  END OF SESSION:  PT End of Session - 09/17/23 1656     Visit Number 3    Number of Visits 16    Date for PT Re-Evaluation 10/24/23    Authorization Type [place] MEDICAID [employer]    PT Start Time 1615    PT Stop Time 1653    PT Time Calculation (min) 38 min    Activity Tolerance Patient tolerated treatment well    Behavior During Therapy [facility] for tasks assessed/performed              Past Medical History:  Diagnosis Date   Anemia    Asthma    only uses inhaler in fall and spring   Constipation    GERD (gastroesophageal reflux disease)    diet controlled, occ. uses omeprazole   Headache    otc med prn   History of kidney stones    Hx of Bell's palsy June 2013   Hypertension    currently no meds   Menometrorrhagia    Prediabetes    Psoriasis    Shortness of breath dyspnea    with exercise/exertion   Vitamin D deficiency    Past Surgical History:  Procedure Laterality Date   COMBINED HYSTEROSCOPY DIAGNOSTIC / D&C N/A 2011   Benign secretory endometrium   CYSTOSCOPY WITH STENT PLACEMENT Left 10/19/2014   Procedure: CYSTOSCOPY WITH STENT PLACEMENT;  Surgeon: Crist Fat, MD;  Location: [facility];  Service: Urology;  Laterality: Left;   DILATION AND CURETTAGE OF UTERUS  10/2003   Polyp, mild atypia, simple and complex hyperplasia,   DILATION AND CURETTAGE OF UTERUS  11/2003   Simple and complex hyperplasia   DILATION AND CURETTAGE OF UTERUS  2011   DILATION AND CURETTAGE OF UTERUS N/A 12/14/2014   Procedure: DILATATION AND CURETTAGE;  Surgeon: Catalina Antigua, MD;  Location: [facility];  Service: Gynecology;  Laterality: N/A;   HOLMIUM LASER APPLICATION Left 10/19/2014   Procedure: HOLMIUM LASER APPLICATION;  Surgeon: Crist Fat, MD;  Location: [facility];  Service: Urology;  Laterality: Left;    HYSTEROSCOPY WITH D & C N/A 07/21/2018   Procedure: DILATATION AND CURETTAGE /HYSTEROSCOPY;  Surgeon: Adam Phenix, MD;  Location: [facility];  Service: Gynecology;  Laterality: N/A;   INTRAUTERINE DEVICE (IUD) INSERTION N/A 12/14/2014   Procedure: INTRAUTERINE DEVICE (IUD) INSERTION;  Surgeon: Catalina Antigua, MD;  Location: [facility];  Service: Gynecology;  Laterality: N/A;   STONE EXTRACTION WITH BASKET Left 10/19/2014   Procedure: STONE EXTRACTION WITH BASKET;  Surgeon: Crist Fat, MD;  Location: [facility];  Service: Urology;  Laterality: Left;   WISDOM TOOTH EXTRACTION     Patient Active Problem List   Diagnosis Date Noted   Sinusitis 09/09/2023   Early awakening 08/26/2023   Lumbar radiculopathy 07/17/2023   Acute cough 06/25/2023   Strep throat 06/18/2023   Adjustment disorder with anxious mood 05/16/2023   Primary osteoarthritis of right knee 05/15/2023   Right hip pain 05/15/2023   Spondylosis of lumbar spine 05/15/2023   Cervical spondylolysis 04/16/2023   Cervical sprain 04/16/2023   Encounter for screening for other metabolic disorders 04/16/2023   Lumbar sprain 04/16/2023   Left wrist sprain 04/16/2023   Haglund's deformity of left heel 04/16/2023   Derangement of left knee 04/16/2023   Vaginal discharge 04/16/2023   Intracranial hypertension 03/04/2023  Diabetes mellitus (HCC) 03/04/2023   Phase of life problem 03/04/2023   Primary insomnia 09/19/2022   Migraine without aura and without status migrainosus, not intractable 05/02/2020   Complex tear of lateral meniscus of left knee as current injury 12/22/2019   Iron overload 12/21/2019   Medication management contract signed 12/09/2019   Primary osteoarthritis of left knee 11/17/2019   Patellofemoral pain syndrome of left knee 09/08/2019   Dermatitis 12/18/2018   Vitamin D deficiency 09/25/2018   Absolute anemia 09/25/2018   Class 3 severe obesity with serious comorbidity and body mass index (BMI) of 50.0 to  59.9 in adult [facility]) 09/25/2018   IUD strings lost 09/24/2018   Thickened endometrium 07/20/2018   History of endometrial hyperplasia 07/20/2018   Morbid obesity with BMI of 50.0-59.9, adult (HCC) 07/20/2018   Abnormal uterine bleeding (AUB)    Hypertension 02/25/2012   Hx of Bell's palsy 02/25/2012    PCP: Raymond de Peru MD  REFERRING PROVIDER: Ranelle Oyster, MD  REFERRING DIAG: [phone] (ICD-10-CM) - Spondylosis of lumbar spine M54.16 (ICD-10-CM) - Lumbar radiculopathy Diagnosis: Chronic low back pain and radiculopathy RLE, L4   Rx: Eval and Treat. McKenzie technique, low back and core strengthening and lengthening  Rationale for Evaluation and Treatment: Rehabilitation  THERAPY DIAG:  Other low back pain  Right knee pain, unspecified chronicity  Other abnormalities of gait and mobility  Muscle weakness (generalized)  ONSET DATE: 3 years  SUBJECTIVE:                                                                                                                                                                                           SUBJECTIVE STATEMENT: Pt reports she is recovering from upper respiratory infection.  She has been sleeping in recliner.  She reports some improvement in knee and back, but still has trouble with standing for very long. Currently taking prednisone and zpac.   Patient has been sick recently and hasn't done her exercises.  Pt was performing her HEP prior to being sick.  Pt wearing mask today.  Pt states her back has been bothering her more than her knee.  Pt has R hip pain which travels down to knee and can travel to her foot when driving.  Pt has increased pain with walking and standing.  The pain is typically from her R hip to R knee though can be in her back with walking and standing.  Pt denies any adverse effects after prior Rx.      PERTINENT HISTORY:  Increased BMI, chronic pain, HTN, history of Bell's palsy, anemia, headaches, dyspnea  with exertion  PAIN:  Are you having pain? Yes: NPRS scale: 3/10   Pain location: central lower thoracic/upper lumbar  Pain description: tingling, throbbing Aggravating factors: standing, walking, driving Relieving factors: sitting sometimes, propping something under knee.  PRECAUTIONS: None  WEIGHT BEARING RESTRICTIONS: No  FALLS:  Has patient fallen in last 6 months? 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 Redemonstrated right foraminal disc protrusion at L4-L5 which likely contacts the exiting right L4 nerve root, unchanged. 2. Moderate bilateral facet degenerative change at L3-L4 and L5-S1, unchanged.  MRI 06/06/23 R knee IMPRESSION: 1. Tricompartmental osteoarthritis, with substantial lateral marginal spurring of the patella. 2. Moderate knee effusion. 3. Thickened distal iliotibial band but without surrounding edema to further suggest iliotibial band syndrome.   TODAY'S TREATMENT:                                                                                                                              Pt seen for aquatic therapy today.  Treatment took place in water 3.5-4.75 ft in depth at the [facility] pool. Temp of water was 91.  Pt entered/exited the pool via stairs independently with bilat rail.  - unsupported walking forward/ backward  - side stepping -> with arm addct/ abdct with rainbow hand floats - marching forward/ backward with reciprocal row motion - staggered stance with horiz abdct/ addct with rainbow hand floats - bow and arrow with step back motion, demo and cues for technique - farmer carry with single rainbow hand float  at side, walking backwards  - relaxed squat at wall - straddling noodle  holding corner: cycling, hip abdct/ addct, cross country ski (painful), cycling  Pt requires the buoyancy and hydrostatic pressure of water for support, and to offload joints by unweighting joint load by at least 50 % in navel deep water and by at least 75-80% in chest to neck deep water.  Viscosity of the water is needed for resistance of strengthening. Water current perturbations provides challenge to standing balance requiring increased core activation.     PATIENT EDUCATION:  Education details:   Exercise form,rationale  Person educated: Patient Education method: Solicitor,  Education comprehension: verbalized understanding, returned demonstration, verbal cues required, and tactile cues required  HOME EXERCISE PROGRAM: Access Code: [email or web address] URL: [email or web address] Date: 08/15/2023 Prepared by: Greig Castilla Zaunegger  Exercises - Seated Long Arc Quad  - 2-3 x daily - 7 x weekly - 1-2 sets - 10 reps - 5 second hold - Seated Hamstring Set  - 2-3 x daily - 7 x weekly - 10 reps - 5-10 second hold  ASSESSMENT:  CLINICAL IMPRESSION: Pt reacquainted with aquatic therapy exercises.  She tolerated session well, but had no change in pain.  Will plan to begin creating updated  aquatic HEP for her to use at d/c in community pool.  She should benefit from skilled PT to address impairments and goals and improve overall function. Therapist to check STG next session.   OBJECTIVE IMPAIRMENTS: Abnormal gait, decreased activity tolerance, decreased balance, decreased endurance, decreased mobility, difficulty walking, decreased ROM, decreased strength, increased muscle spasms, impaired flexibility, improper body mechanics, postural dysfunction, obesity, and pain.   ACTIVITY LIMITATIONS: carrying, lifting, bending, standing, squatting, stairs, transfers, reach over head, hygiene/grooming, locomotion level, and caring for others  PARTICIPATION LIMITATIONS: meal prep, cleaning,  laundry, driving, shopping, community activity, occupation, and yard work  PERSONAL FACTORS: Fitness, Time since onset of injury/illness/exacerbation, and 3+ comorbidities: Increased BMI, chronic pain, HTN  are also affecting patient's functional outcome.   REHAB POTENTIAL: Good  CLINICAL DECISION MAKING: Stable/uncomplicated  EVALUATION COMPLEXITY: Low   GOALS: Goals reviewed with patient? Yes  SHORT TERM GOALS: Target date: 09/12/2023    Patient will be independent with HEP in order to improve functional outcomes. Baseline:  Goal status: INITIAL  2.  Patient will report at least 25% improvement in symptoms for improved quality of life. Baseline:  Goal status: INITIAL   LONG TERM GOALS: Target date: 10/10/2023    Patient will report at least 75% improvement in symptoms for improved quality of life. Baseline:  Goal status: INITIAL  2.  Patient will improve LEFS score by at least 18 points in order to indicate improved tolerance to activity. Baseline: 14/80 Goal status: INITIAL  3.  Patient will be able to complete 5x STS in under 14 seconds in order to demonstrate improved functional strength. Baseline: 24. 46 seconds with some R UE support on chair Goal status: INITIAL  4.  Patient will be able to ambulate at least 400 feet in in order to demonstrate improved tolerance to activity. Baseline: 328 feet Goal status: INITIAL  5.  Patient will demonstrate grade of 5/5 MMT or 10lb increase in force in all tested musculature as evidence of improved strength to assist with stair ambulation and gait.   Baseline: see above Goal status: INITIAL     PLAN:  PT FREQUENCY: 1-2x/week  PT DURATION: 8 weeks starting in Feb 2025 due to scheduling  PLANNED INTERVENTIONS: [id number]- PT Re-evaluation, 97110-Therapeutic exercises, 97530- Therapeutic activity, 97112- Neuromuscular re-education, 97535- Self Care, 11914- Manual therapy, [phone]- Gait training, [phone]- Orthotic  Fit/training, [phone]- Canalith repositioning, [id number]- Aquatic Therapy, [phone]- Splinting, Patient/Family education, Balance training, Stair training, Taping, Dry Needling, Joint mobilization, Joint manipulation, Spinal manipulation, Spinal mobilization, Scar mobilization, and DME instructions.  PLAN FOR NEXT SESSION: core and postural strength, LE strength and progress as tolerated.  Cont with land and aquatic therapy.    Mayer Camel, PTA 09/17/23 6:21 PM [facility] [address] [place], [place], [address] Phone: [phone]   Fax:  [phone]

## 2023-09-18 ENCOUNTER — Encounter: Payer: Medicaid Other | Admitting: Physical Medicine & Rehabilitation

## 2023-09-19 ENCOUNTER — Ambulatory Visit (HOSPITAL_BASED_OUTPATIENT_CLINIC_OR_DEPARTMENT_OTHER): Payer: Medicaid Other | Admitting: Physical Therapy

## 2023-09-19 ENCOUNTER — Encounter (HOSPITAL_BASED_OUTPATIENT_CLINIC_OR_DEPARTMENT_OTHER): Payer: Self-pay | Admitting: Physical Therapy

## 2023-09-19 DIAGNOSIS — R29898 Other symptoms and signs involving the musculoskeletal system: Secondary | ICD-10-CM

## 2023-09-19 DIAGNOSIS — R2689 Other abnormalities of gait and mobility: Secondary | ICD-10-CM

## 2023-09-19 DIAGNOSIS — M25561 Pain in right knee: Secondary | ICD-10-CM

## 2023-09-19 DIAGNOSIS — M5459 Other low back pain: Secondary | ICD-10-CM

## 2023-09-19 DIAGNOSIS — M6281 Muscle weakness (generalized): Secondary | ICD-10-CM

## 2023-09-19 NOTE — Therapy (Signed)
OUTPATIENT PHYSICAL THERAPY THORACOLUMBAR TREATMENT   Patient Name: IJANAE MACAPAGAL MRN: [id number] DOB:[birth year], 43 y.o., female Today's Date: 09/19/2023  END OF SESSION:  PT End of Session - 09/19/23 1558     Visit Number 4    Number of Visits 16    Date for PT Re-Evaluation 10/24/23    Authorization Type [place] MEDICAID [employer]    PT Start Time 1546    PT Stop Time 1625    PT Time Calculation (min) 39 min    Activity Tolerance Patient tolerated treatment well    Behavior During Therapy [facility] for tasks assessed/performed              Past Medical History:  Diagnosis Date   Anemia    Asthma    only uses inhaler in fall and spring   Constipation    GERD (gastroesophageal reflux disease)    diet controlled, occ. uses omeprazole   Headache    otc med prn   History of kidney stones    Hx of Bell's palsy June 2013   Hypertension    currently no meds   Menometrorrhagia    Prediabetes    Psoriasis    Shortness of breath dyspnea    with exercise/exertion   Vitamin D deficiency    Past Surgical History:  Procedure Laterality Date   COMBINED HYSTEROSCOPY DIAGNOSTIC / D&C N/A 2011   Benign secretory endometrium   CYSTOSCOPY WITH STENT PLACEMENT Left 10/19/2014   Procedure: CYSTOSCOPY WITH STENT PLACEMENT;  Surgeon: Crist Fat, MD;  Location: [facility];  Service: Urology;  Laterality: Left;   DILATION AND CURETTAGE OF UTERUS  10/2003   Polyp, mild atypia, simple and complex hyperplasia,   DILATION AND CURETTAGE OF UTERUS  11/2003   Simple and complex hyperplasia   DILATION AND CURETTAGE OF UTERUS  2011   DILATION AND CURETTAGE OF UTERUS N/A 12/14/2014   Procedure: DILATATION AND CURETTAGE;  Surgeon: Catalina Antigua, MD;  Location: [facility];  Service: Gynecology;  Laterality: N/A;   HOLMIUM LASER APPLICATION Left 10/19/2014   Procedure: HOLMIUM LASER APPLICATION;  Surgeon: Crist Fat, MD;  Location: [facility];  Service: Urology;  Laterality: Left;    HYSTEROSCOPY WITH D & C N/A 07/21/2018   Procedure: DILATATION AND CURETTAGE /HYSTEROSCOPY;  Surgeon: Adam Phenix, MD;  Location: [facility];  Service: Gynecology;  Laterality: N/A;   INTRAUTERINE DEVICE (IUD) INSERTION N/A 12/14/2014   Procedure: INTRAUTERINE DEVICE (IUD) INSERTION;  Surgeon: Catalina Antigua, MD;  Location: [facility];  Service: Gynecology;  Laterality: N/A;   STONE EXTRACTION WITH BASKET Left 10/19/2014   Procedure: STONE EXTRACTION WITH BASKET;  Surgeon: Crist Fat, MD;  Location: [facility];  Service: Urology;  Laterality: Left;   WISDOM TOOTH EXTRACTION     Patient Active Problem List   Diagnosis Date Noted   Sinusitis 09/09/2023   Early awakening 08/26/2023   Lumbar radiculopathy 07/17/2023   Acute cough 06/25/2023   Strep throat 06/18/2023   Adjustment disorder with anxious mood 05/16/2023   Primary osteoarthritis of right knee 05/15/2023   Right hip pain 05/15/2023   Spondylosis of lumbar spine 05/15/2023   Cervical spondylolysis 04/16/2023   Cervical sprain 04/16/2023   Encounter for screening for other metabolic disorders 04/16/2023   Lumbar sprain 04/16/2023   Left wrist sprain 04/16/2023   Haglund's deformity of left heel 04/16/2023   Derangement of left knee 04/16/2023   Vaginal discharge 04/16/2023   Intracranial hypertension 03/04/2023  Diabetes mellitus (HCC) 03/04/2023   Phase of life problem 03/04/2023   Primary insomnia 09/19/2022   Migraine without aura and without status migrainosus, not intractable 05/02/2020   Complex tear of lateral meniscus of left knee as current injury 12/22/2019   Iron overload 12/21/2019   Medication management contract signed 12/09/2019   Primary osteoarthritis of left knee 11/17/2019   Patellofemoral pain syndrome of left knee 09/08/2019   Dermatitis 12/18/2018   Vitamin D deficiency 09/25/2018   Absolute anemia 09/25/2018   Class 3 severe obesity with serious comorbidity and body mass index (BMI) of 50.0 to  59.9 in adult [facility]) 09/25/2018   IUD strings lost 09/24/2018   Thickened endometrium 07/20/2018   History of endometrial hyperplasia 07/20/2018   Morbid obesity with BMI of 50.0-59.9, adult (HCC) 07/20/2018   Abnormal uterine bleeding (AUB)    Hypertension 02/25/2012   Hx of Bell's palsy 02/25/2012    PCP: Raymond de Peru MD  REFERRING PROVIDER: Ranelle Oyster, MD  REFERRING DIAG: [phone] (ICD-10-CM) - Spondylosis of lumbar spine M54.16 (ICD-10-CM) - Lumbar radiculopathy Diagnosis: Chronic low back pain and radiculopathy RLE, L4   Rx: Eval and Treat. McKenzie technique, low back and core strengthening and lengthening  Rationale for Evaluation and Treatment: Rehabilitation  THERAPY DIAG:  Other low back pain  Right knee pain, unspecified chronicity  Other abnormalities of gait and mobility  Muscle weakness (generalized)  Other symptoms and signs involving the musculoskeletal system  ONSET DATE: 3 years  SUBJECTIVE:                                                                                                                                                                                           SUBJECTIVE STATEMENT: Patient denies any adverse effects after prior land based and aquatic treatment.  Pt has been having back discomfort and states her pain has been a little higher than normal.  Pt uses a pillow for back support when sitting at home.  Pt states she has been doing her HEP somewhat.  Pt states her knee has been ok.  Pt has increased pain with walking and standing.     PERTINENT HISTORY:  Increased BMI, chronic pain, HTN, history of Bell's palsy, anemia, headaches, dyspnea with exertion   PAIN:  Are you having pain? Yes: NPRS scale: 6/10  /  0/10  Pain location: central lower thoracic/upper lumbar  /  R knee Pain description: tingling, throbbing Aggravating factors: standing, walking, driving Relieving factors: sitting sometimes, propping something under  knee.  PRECAUTIONS: None  WEIGHT BEARING RESTRICTIONS: No  FALLS:  Has patient fallen in last 6 months?  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 Redemonstrated right foraminal disc protrusion at L4-L5 which likely contacts the exiting right L4 nerve root, unchanged. 2. Moderate bilateral facet degenerative change at L3-L4 and L5-S1, unchanged.  MRI 06/06/23 R knee IMPRESSION: 1. Tricompartmental osteoarthritis, with substantial lateral marginal spurring of the patella. 2. Moderate knee effusion. 3. Thickened distal iliotibial band but without surrounding edema to further suggest iliotibial band syndrome.   TODAY'S TREATMENT:                                                                                                                               Reviewed current function, pain levels, response to prior Rx, and HEP compliance.    LAQ 2 x 10   Prone lying x 2 mins Prone prop  up 2x1 min PPU x 10, x 5 reps  PT educated pt in correct performance and palpation of TrA contraction.  Pt performed seated and supine TrA contraction. Supine marching with TrA Supine clams with TrA with RTB  Updated HEP and gave pt a HEP handout.  PT educated pt in correct form and appropriate frequency.    PATIENT EDUCATION:  Education details: PT educated pt to try PPU at home for 10 reps when having pain to determine response to pain.  PT instructed pt to stop PPU if having increased pain or pain traveling down leg.  PT updated HEP and gave pt a handout.  Exercise form, HEP, and POC. Person educated: Patient Education method: Explanation, Demonstration, and Handouts Education comprehension: verbalized understanding, returned demonstration, verbal cues  required, and tactile cues required  HOME EXERCISE PROGRAM: Access Code: [email or web address] URL: [email or web address] Date: 08/15/2023 Prepared by: Greig Castilla Zaunegger  Exercises - Seated Long Arc Quad  - 2-3 x daily - 7 x weekly - 1-2 sets - 10 reps - 5 second hold - Seated Hamstring Set  - 2-3 x daily - 7 x weekly - 10 reps - 5-10 second hold  Updated HEP: - Supine Transversus Abdominis Bracing - Hands on Stomach  - 2 x daily - 7 x weekly - 2 sets - 10 reps - Hooklying Clamshell with Resistance  - 1 x daily - 4-5 x weekly - 2 sets - 10 reps  ASSESSMENT:  CLINICAL IMPRESSION: Patient's back has been bother her more than her knee.  She states her knee feels ok.  PT had pt perform Mckenzie exercises.  Pt reports no change in pain with prone lying and prone prop up.  Pt states she felt some relief with PPU and reports pain improved from 6/10 to 5/10 after PPU.  PT instructed pt in correct form with TrA contractions in sitting and supine.  Pt felt more comfortable with supine TrA contractions and PT  included supine TrA contractions in HEP.  Pt responded well to Rx stating she felt better after Rx reporting improved pain from 6/10 before Rx to 4/10 after Rx.  She should benefit from continued skilled PT including land based and aquatic therapy to address impairments and goals and improve overall function.       OBJECTIVE IMPAIRMENTS: Abnormal gait, decreased activity tolerance, decreased balance, decreased endurance, decreased mobility, difficulty walking, decreased ROM, decreased strength, increased muscle spasms, impaired flexibility, improper body mechanics, postural dysfunction, obesity, and pain.   ACTIVITY LIMITATIONS: carrying, lifting, bending, standing, squatting, stairs, transfers, reach over head, hygiene/grooming, locomotion level, and caring for others  PARTICIPATION LIMITATIONS: meal prep, cleaning, laundry, driving, shopping, community activity, occupation, and yard  work  PERSONAL FACTORS: Fitness, Time since onset of injury/illness/exacerbation, and 3+ comorbidities: Increased BMI, chronic pain, HTN  are also affecting patient's functional outcome.   REHAB POTENTIAL: Good  CLINICAL DECISION MAKING: Stable/uncomplicated  EVALUATION COMPLEXITY: Low   GOALS: Goals reviewed with patient? Yes  SHORT TERM GOALS: Target date: 09/12/2023    Patient will be independent with HEP in order to improve functional outcomes. Baseline:  Goal status: INITIAL  2.  Patient will report at least 25% improvement in symptoms for improved quality of life. Baseline:  Goal status: INITIAL   LONG TERM GOALS: Target date: 10/10/2023    Patient will report at least 75% improvement in symptoms for improved quality of life. Baseline:  Goal status: INITIAL  2.  Patient will improve LEFS score by at least 18 points in order to indicate improved tolerance to activity. Baseline: 14/80 Goal status: INITIAL  3.  Patient will be able to complete 5x STS in under 14 seconds in order to demonstrate improved functional strength. Baseline: 24. 46 seconds with some R UE support on chair Goal status: INITIAL  4.  Patient will be able to ambulate at least 400 feet in in order to demonstrate improved tolerance to activity. Baseline: 328 feet Goal status: INITIAL  5.  Patient will demonstrate grade of 5/5 MMT or 10lb increase in force in all tested musculature as evidence of improved strength to assist with stair ambulation and gait.   Baseline: see above Goal status: INITIAL     PLAN:  PT FREQUENCY: 1-2x/week  PT DURATION: 8 weeks starting in Feb 2025 due to scheduling  PLANNED INTERVENTIONS: 97164- PT Re-evaluation, 97110-Therapeutic exercises, 97530- Therapeutic activity, 97112- Neuromuscular re-education, 97535- Self Care, 96295- Manual therapy, [phone]- Gait training, [phone]- Orthotic Fit/training, [phone]- Canalith repositioning, [id number]- Aquatic Therapy, [phone]-  Splinting, Patient/Family education, Balance training, Stair training, Taping, Dry Needling, Joint mobilization, Joint manipulation, Spinal manipulation, Spinal mobilization, Scar mobilization, and DME instructions.  PLAN FOR NEXT SESSION: core and postural strength, LE strength and progress as tolerated.  Cont with land and aquatic therapy.     Audie Clear III PT, DPT 09/19/23 5:46 PM

## 2023-09-20 ENCOUNTER — Ambulatory Visit: Payer: Managed Care, Other (non HMO) | Admitting: Licensed Clinical Social Worker

## 2023-09-20 ENCOUNTER — Encounter (HOSPITAL_BASED_OUTPATIENT_CLINIC_OR_DEPARTMENT_OTHER): Payer: Self-pay | Admitting: Family Medicine

## 2023-09-20 DIAGNOSIS — F4322 Adjustment disorder with anxiety: Secondary | ICD-10-CM

## 2023-09-20 NOTE — Progress Notes (Addendum)
 [facility] Counselor/Therapist Progress Note  Patient ID: Molly Archer, MRN: [id number]    Date: 09/20/23  Time Spent: 0500  pm - 0600 pm : 60 Minutes  Treatment Type: Individual Therapy. DIAGNOSIS: Adjustment Disorder with Anxious mood Reported Symptoms: anxiety related to social interaction and church relationships and work   Mental Status Exam: Appearance:  Casual     Behavior: Appropriate  Motor: Normal  Speech/Language:  Clear and Coherent  Affect: Appropriate  Mood: normal  Thought process: normal  Thought content:   WNL  Sensory/Perceptual disturbances:   WNL  Orientation: oriented to person, place, time/date, situation, day of week, month of year, and year  Attention: Good  Concentration: Good  Memory: WNL  Fund of knowledge:  Good  Insight:   Good  Judgment:  Good  Impulse Control: Good    Risk Assessment: Danger to Self:  No Self-injurious Behavior: No Danger to Others: No Duty to Warn:no Physical Aggression / Violence:No  Access to Firearms a concern: No  Gang Involvement:No    Subjective:    Molly Archer participated from home, via video, patient was aware of risk and limitations, and consented to treatment. Therapist participated from office. We met online due to patient request.    Molly Archer presented for her session reporting she is feeling poorly. Patient went and laid her head down. She reports that the past couple of weeks she has been very ill. She reports having double ear infections and respiratory issues. She states she has been to the doctor but nothing seems to be helping her improve. Patient reports she has been trying to work since Wednesday and has been struggling. Patient reports having new rules at work that each day missed requires a note from the provider. She reports not being able to see the doctor each time and fear of it affecting her job. Patient reports that work remains stressful due to her struggle with keeping  up with documentation. She reports that her days are so busy that she struggles to keep up. Patient also shared her concern over her overall health. She reports that she needs to exercise and wants to be healthier and feel better about herself.  Clinician actively listened and provided support through active listening and verbal feedback. Clinician encouraged patient to speak with her provider and report that she is feeling so badly and perhaps they may need to run additional test or change medication. Clinician also processed with patient the idea of getting a exercise partner to walk with her as form of motivation. Clinician also processed with patient utilizing a planner and making list of things to do and completing the task in order from easiest to most challenging, marking them off as she completes them. We also processed utilizing time wisely and when she feels a bit anxious or tired to get up and walk around and get a drink and then return to her desk.  Patient was fully engaged in discussion. Although patient wasn't feeling good she was intentional with her words and verbalized how she has been feeling and her desire for life to improve. Patient was polite and cooperative and open to suggestions and feedback. Molly Archer will continue to work toward her coping skills and goals. Coping skills include making time for herself and recognizing what is good for her and what robs her of her joy and peace. We discussed visiting various churches and ministries, community involvement as well as allowing herself to be vulnerable in making friends and  doing things with those friends. Patient will continue with biweekly therapy sessions utilizing CBT techniques including cognitive restructuring, and relaxation techniques. Treatment plan to be reviewed by 05/15/2024.    Goals include gradually increasing exposure to social situations, practicing social skills like eye contact and conversation initiation, and utilizing  coping mechanisms like deep breathing, with the aim of building confidence and reducing anxiety in social settings; examples include attending one social event per month, initiating a conversation with a new person each week, or making small talk with a Conservation officer, nature at the store.   Molly Archer MSW, LCSW/DATE  09/20/2023

## 2023-09-23 ENCOUNTER — Encounter: Payer: Self-pay | Admitting: Neurology

## 2023-09-23 ENCOUNTER — Other Ambulatory Visit (HOSPITAL_BASED_OUTPATIENT_CLINIC_OR_DEPARTMENT_OTHER): Payer: Self-pay

## 2023-09-23 ENCOUNTER — Ambulatory Visit (INDEPENDENT_AMBULATORY_CARE_PROVIDER_SITE_OTHER): Payer: Managed Care, Other (non HMO) | Admitting: Neurology

## 2023-09-23 ENCOUNTER — Other Ambulatory Visit: Payer: Self-pay

## 2023-09-23 VITALS — BP 121/68 | HR 96 | Ht 65.0 in | Wt 333.0 lb

## 2023-09-23 DIAGNOSIS — G4700 Insomnia, unspecified: Secondary | ICD-10-CM

## 2023-09-23 DIAGNOSIS — R011 Cardiac murmur, unspecified: Secondary | ICD-10-CM

## 2023-09-23 DIAGNOSIS — R519 Headache, unspecified: Secondary | ICD-10-CM | POA: Diagnosis not present

## 2023-09-23 DIAGNOSIS — R351 Nocturia: Secondary | ICD-10-CM

## 2023-09-23 DIAGNOSIS — Z79891 Long term (current) use of opiate analgesic: Secondary | ICD-10-CM

## 2023-09-23 DIAGNOSIS — G4733 Obstructive sleep apnea (adult) (pediatric): Secondary | ICD-10-CM

## 2023-09-23 DIAGNOSIS — Z6841 Body Mass Index (BMI) 40.0 and over, adult: Secondary | ICD-10-CM

## 2023-09-23 NOTE — Patient Instructions (Signed)
 Thank you for choosing [facility] for your sleep related care! It was nice to meet you today!   Here is what we discussed today:    Based on your symptoms and your exam I believe you are still at risk for obstructive sleep apnea (aka OSA). We should proceed with a sleep study to determine whether you do or do not have OSA and how severe it is. Even, if you have mild OSA, I may want you to consider treatment with CPAP, as treatment of even borderline or mild sleep apnea can result and improvement of symptoms such as sleep disruption, daytime sleepiness, nighttime bathroom breaks, restless leg symptoms, improvement of headache syndromes, even improved mood disorder.   As explained, an attended sleep study (meaning you get to stay overnight in the sleep lab), lets [place] monitor sleep-related behaviors such as sleep talking and leg movements in sleep, in addition to monitoring for sleep apnea.  A home sleep test is a screening tool for sleep apnea diagnosis only, but unfortunately, does not help with any other sleep-related diagnoses.  Please remember, the long-term risks and ramifications of untreated moderate to severe obstructive sleep apnea may include (but are not limited to): increased risk for cardiovascular disease, including congestive heart failure, stroke, difficult to control hypertension, treatment resistant obesity, arrhythmias, especially irregular heartbeat commonly known as A. Fib. (atrial fibrillation); even type 2 diabetes has been linked to untreated OSA.   Other correlations that untreated obstructive sleep apnea include macular edema which is swelling of the retina in the eyes, droopy eyelid syndrome, and elevated hemoglobin and hematocrit levels (often referred to as polycythemia).  Sleep apnea can cause disruption of sleep and sleep deprivation in most cases, which, in turn, can cause recurrent headaches, problems with memory, mood, concentration, focus, and  vigilance. Most people with untreated sleep apnea report excessive daytime sleepiness, which can affect their ability to drive. Please do not drive or use heavy equipment or machinery, if you feel sleepy! Patients with sleep apnea can also develop difficulty initiating and maintaining sleep (aka insomnia).   Having sleep apnea may increase your risk for other sleep disorders, including involuntary behaviors sleep such as sleep terrors, sleep talking, sleepwalking.    Having sleep apnea can also increase your risk for restless leg syndrome and leg movements at night.   Please note that untreated obstructive sleep apnea may carry additional perioperative morbidity. Patients with significant obstructive sleep apnea (typically, in the moderate to severe degree) should receive, if possible, perioperative PAP (positive airway pressure) therapy and the surgeons and particularly the anesthesiologists should be informed of the diagnosis and the severity of the sleep disordered breathing.   We will call you or email you through MyChart with regards to your test results and plan a follow-up in sleep clinic accordingly. Most likely, you will hear from one of our nurses.   Our sleep lab administrative assistant will call you to schedule your sleep study and give you further instructions, regarding the check in process for the sleep study, arrival time, what to bring, when you can expect to leave after the study, etc., and to answer any other logistical questions you may have. If you don't hear back from her by about 2 weeks from now, please feel free to call her direct line at [phone] or you can call our general clinic number, or email [place] through My Chart.

## 2023-09-23 NOTE — Progress Notes (Signed)
 Subjective:    Patient ID: Molly Archer is a 43 y.o. female.  HPI    Huston Foley, MD, PhD [facility] [address], [address] [address] [place], [place] [address]  Dear Dr. de [place],   I saw your patient, Molly Archer, upon your kind request in my sleep clinic today for initial consultation of sleep disorder, in particular, concern for underlying obstructive sleep apnea.  The patient is unaccompanied today.  As you know, Molly Archer is a 43 year old female with an underlying medical history of migraine headaches (followed by Dr. Everlena Cooper with [facility]), diabetes, hypertension, anemia, asthma, reflux disease, history of kidney stones, history of Bell's palsy, psoriasis, vitamin D deficiency and morbid obesity with a BMI of over 50, who reports snoring and excessive daytime somnolence as well as sleep disruption and chronic difficulty initiating and maintaining sleep.  Her Epworth sleepiness score is 9 out of 24, fatigue severity score is 59 out of 63.  I reviewed your office note from 08/26/2023.  She was evaluated for obstructive sleep apnea through [facility] in 2023.  She reportedly had a home sleep test which showed an AHI of 5.7/h, with mild snoring.  I am not sure how low her oxygen saturation went.  The formal home sleep test report is not available for my review today.  She had previously seen Dr. Charmayne Sheer with [facility] and Verlee Rossetti, [place].  She had previously seen [facility] neurolog[facility] for her migraine headaches in 2021. She is not keen on pursuing another sleep test but would be willing to consider a laboratory sleep study.  She has chronic pain in her hip and knee and back and takes oxycodone as needed, is followed by Dr. Faith Rogue for pain management.  Dr. Everlena Cooper with neurology has also been a prescription for nortriptyline and Topamax for migraine management but she has not been taking these medicines yet as she is not keen on taking more  medications at this time.  She has been on Ambien 5 mg as needed for the past 2 years, does not take it every night.  She also takes Zanaflex as needed.  She is single and lives with her mom.  She is not aware of any family history of sleep apnea.  Sometimes she sleeps in different bedrooms, she starts off in a lounge chair and has slept in a recliner as well.  Her chronic sleep difficulty started back in 2021 after her knee surgery.  She has occasional morning headaches and has nocturia about once or twice per average night.  She does not drink any daily caffeine, no alcohol.  She is a non-smoker.  She works as a Sports coach for [employer], [employer].  They have no pets in the household.  Bedtime is generally around 9:30 PM.  She wakes up in the middle of the night.  She has trouble going back to sleep.  Rise time is around 5:45 AM but she is typically awake before then.  Her Past Medical History Is Significant For: Past Medical History:  Diagnosis Date   Anemia    Asthma    only uses inhaler in fall and spring   Constipation    GERD (gastroesophageal reflux disease)    diet controlled, occ. uses omeprazole   Headache    otc med prn   History of kidney stones    Hx of Bell's palsy June 2013   Hypertension    currently no meds  Menometrorrhagia    Prediabetes    Psoriasis    Shortness of breath dyspnea    with exercise/exertion   Vitamin D deficiency     Her Past Surgical History Is Significant For: Past Surgical History:  Procedure Laterality Date   COMBINED HYSTEROSCOPY DIAGNOSTIC / D&C N/A 2011   Benign secretory endometrium   CYSTOSCOPY WITH STENT PLACEMENT Left 10/19/2014   Procedure: CYSTOSCOPY WITH STENT PLACEMENT;  Surgeon: Crist Fat, MD;  Location: [facility];  Service: Urology;  Laterality: Left;   DILATION AND CURETTAGE OF UTERUS  10/2003   Polyp, mild atypia, simple and complex hyperplasia,   DILATION AND CURETTAGE OF UTERUS  11/2003   Simple and complex  hyperplasia   DILATION AND CURETTAGE OF UTERUS  2011   DILATION AND CURETTAGE OF UTERUS N/A 12/14/2014   Procedure: DILATATION AND CURETTAGE;  Surgeon: Catalina Antigua, MD;  Location: [facility];  Service: Gynecology;  Laterality: N/A;   HOLMIUM LASER APPLICATION Left 10/19/2014   Procedure: HOLMIUM LASER APPLICATION;  Surgeon: Crist Fat, MD;  Location: [facility];  Service: Urology;  Laterality: Left;   HYSTEROSCOPY WITH D & C N/A 07/21/2018   Procedure: DILATATION AND CURETTAGE /HYSTEROSCOPY;  Surgeon: Adam Phenix, MD;  Location: [facility];  Service: Gynecology;  Laterality: N/A;   INTRAUTERINE DEVICE (IUD) INSERTION N/A 12/14/2014   Procedure: INTRAUTERINE DEVICE (IUD) INSERTION;  Surgeon: Catalina Antigua, MD;  Location: [facility];  Service: Gynecology;  Laterality: N/A;   STONE EXTRACTION WITH BASKET Left 10/19/2014   Procedure: STONE EXTRACTION WITH BASKET;  Surgeon: Crist Fat, MD;  Location: [facility];  Service: Urology;  Laterality: Left;   WISDOM TOOTH EXTRACTION      Her Family History Is Significant For: Family History  Problem Relation Age of Onset   Hypertension Mother    Hyperlipidemia Mother    Hypertension Father    Stroke Maternal Grandmother    Hypertension Other    Diabetes Other    Cancer Other     His Social History Is Significant For: Social History   Socioeconomic History   Marital status: Single    Spouse name: Not on file   Number of children: Not on file   Years of education: Not on file   Highest education level: Bachelor's degree (e.g., BA, AB, BS)  Occupational History   Not on file  Tobacco Use   Smoking status: Never    Passive exposure: Never   Smokeless tobacco: Never  Vaping Use   Vaping status: Never Used  Substance and Sexual Activity   Alcohol use: No   Drug use: No   Sexual activity: Not Currently    Birth control/protection: None  Other Topics Concern   Not on file  Social History Narrative   She is from [place], [place], moved here  1998.    Lives at home by herself.    Social Drivers of Corporate investment banker Strain: High Risk (07/13/2023)   Overall Financial Resource Strain (CARDIA)    Difficulty of Paying Living Expenses: Very hard  Food Insecurity: Food Insecurity Present (07/13/2023)   Hunger Vital Sign    Worried About Running Out of Food in the Last Year: Often true    Ran Out of Food in the Last Year: Sometimes true  Transportation Needs: No Transportation Needs (07/13/2023)   PRAPARE - Administrator, Civil Service (Medical): No    Lack of Transportation (Non-Medical): No  Physical Activity: Insufficiently Active (07/13/2023)  Exercise Vital Sign    Days of Exercise per Week: 1 day    Minutes of Exercise per Session: 10 min  Stress: Stress Concern Present (07/13/2023)   [employer] of Occupational Health - Occupational Stress Questionnaire    Feeling of Stress : To some extent  Social Connections: Moderately Isolated (07/13/2023)   Social Connection and Isolation Panel [NHANES]    Frequency of Communication with Friends and Family: Once a week    Frequency of Social Gatherings with Friends and Family: Never    Attends Religious Services: More than 4 times per year    Active Member of [employer] or Organizations: Yes    Attends Banker Meetings: 1 to 4 times per year    Marital Status: Never married    Her Allergies Are:  Allergies  Allergen Reactions   Losartan Other (See Comments)    headache   Penicillins Hives    Has patient had a PCN reaction causing immediate rash, facial/tongue/throat swelling, SOB or lightheadedness with hypotension: No Has patient had a PCN reaction causing severe rash involving mucus membranes or skin necrosis: No Has patient had a PCN reaction that required hospitalization No Has patient had a PCN reaction occurring within the last 10 years: No If all of the above answers are "NO", then may proceed with Cephalosporin use.    Tramadol  Hives  :   Her Current Medications Are:  Outpatient Encounter Medications as of 09/23/2023  Medication Sig   albuterol (PROVENTIL HFA;VENTOLIN HFA) 108 (90 Base) MCG/ACT inhaler Inhale 2 puffs into the lungs every 4 (four) hours as needed for wheezing or shortness of breath (cough, shortness of breath or wheezing.).   cetirizine (ZYRTEC) 10 MG tablet Take by mouth.   Ciclopirox 1 % shampoo Apply 1 each topically at bedtime.   clobetasol ointment (TEMOVATE) 0.05 % Apply topically 2 (two) times daily.   diclofenac (VOLTAREN) 75 MG EC tablet Take 1 tablet (75 mg total) by mouth 2 (two) times daily with a meal.   doxazosin (CARDURA) 1 MG tablet Take 1 tablet (1 mg total) by mouth daily.   fluocinolone 0.01 % cream Apply twice a week.   Fluocinolone Acetonide Scalp 0.01 % OIL Apply 1 Application topically 2 (two) times a week.   fluticasone (FLONASE) 50 MCG/ACT nasal spray Administer 1 spray in each nostril 2 times daily.   glipiZIDE (GLUCOTROL XL) 10 MG 24 hr tablet Take 1 tablet (10 mg total) by mouth daily.   glucose blood (PRECISION QID TEST) test strip Check glucose twice daily as needed.   hydrochlorothiazide (MICROZIDE) 12.5 MG capsule Take 1 capsule (12.5 mg total) by mouth daily.   ipratropium-albuterol (DUONEB) 0.5-2.5 (3) MG/3ML SOLN Inhale 3 mLs into the lungs every 6 (six) hours as needed.   irbesartan-hydrochlorothiazide (AVALIDE) 300-12.5 MG tablet Take 1 tablet by mouth daily.   Naftifine HCl (NAFTIN) 2 % CREA Apply liberally to affected area daily   NIFEdipine (PROCARDIA XL/NIFEDICAL XL) 60 MG 24 hr tablet Take 1 tablet (60 mg total) by mouth daily.   norethindrone (AYGESTIN) 5 MG tablet Take 3 tablets (15 mg total) by mouth daily.   ondansetron (ZOFRAN-ODT) 4 MG disintegrating tablet Take 4 mg by mouth every 8 (eight) hours as needed.   Oxycodone HCl 10 MG TABS Take 1 tablet (10 mg total) by mouth every 8 (eight) hours as needed.   tirzepatide [facility]) 5 MG/0.5ML Pen Inject 5  mg into the skin once a week.   tiZANidine (  ZANAFLEX) 4 MG tablet Take 1 tablet (4 mg total) by mouth every 8 (eight) hours as needed for muscle spasms.   triamcinolone cream (KENALOG) 0.1 % Apply 1 Application topically 2 (two) times daily.   zolpidem (AMBIEN) 5 MG tablet Take 1 tablet (5 mg total) by mouth at bedtime as needed for sleep.   nortriptyline (PAMELOR) 10 MG capsule Take 1-2 capsules (10-20 mg total) by mouth at bedtime. (Patient not taking: Reported on 09/23/2023)   topiramate (TOPAMAX) 50 MG tablet Take 1/2 tablet at bedtime for one week, then increase to 1 tablet at bedtime (Patient not taking: Reported on 09/23/2023)   No facility-administered encounter medications on file as of 09/23/2023.  :   Review of Systems:  Out of a complete 14 point review of systems, all are reviewed and negative with the exception of these symptoms as listed below:  Review of Systems  Neurological:        Patient in room #9 and alone. Patient states she snore and stats it hard for her to sleep at night. Patient states she wakes with headaches and she has one today. ESS-1 & FSS-59    Objective:  Neurological Exam  Physical Exam Physical Examination:   Vitals:   09/23/23 1501  BP: 121/68  Pulse: 96    General Examination: The patient is a very pleasant 43 y.o. female in no acute distress. She appears well-developed and well-nourished and well groomed.   HEENT: Normocephalic, atraumatic, pupils are equal, round and reactive to light, extraocular tracking is good without limitation to gaze excursion or nystagmus noted. Hearing is grossly intact. Face is symmetric with normal facial animation. Speech is clear with no dysarthria noted. There is no hypophonia. There is no lip, neck/head, jaw or voice tremor. Neck is supple with full range of passive and active motion. There are no carotid bruits on auscultation. Oropharynx exam reveals: mild mouth dryness, good dental hygiene and moderate airway  crowding, due to small airway entry and thicker soft palate.  Mallampati class III, tonsillar size 1+, tongue protrudes centrally and palate elevates symmetrically, neck circumference 20 inches, minimal overbite noted.  Chest: Clear to auscultation without wheezing, rhonchi or crackles noted.  Heart: S1+S2+0, regular and normal without murmurs, rubs or gallops noted.   Abdomen: Soft, non-tender and non-distended.  Extremities: There is no pitting edema in the distal lower extremities bilaterally.   Skin: Warm and dry without trophic changes noted.   Musculoskeletal: exam reveals no obvious joint deformities.   Neurologically:  Mental status: The patient is awake, alert and oriented in all 4 spheres. Her immediate and remote memory, attention, language skills and fund of knowledge are appropriate. There is no evidence of aphasia, agnosia, apraxia or anomia. Speech is clear with normal prosody and enunciation. Thought process is linear. Mood is normal and affect is normal.  Cranial nerves II - XII are as described above under HEENT exam.  Motor exam: Normal bulk, strength and tone is noted. There is no obvious action or resting tremor.  Fine motor skills and coordination: grossly intact.  Cerebellar testing: No dysmetria or intention tremor. There is no truncal or gait ataxia.  Sensory exam: intact to light touch in the upper and lower extremities.  Gait, station and balance: She stands easily. No veering to one side is noted. No leaning to one side is noted. Posture is age-appropriate and stance is narrow based. Gait shows normal stride length and normal pace. No problems turning are noted.  Assessment and Plan:  In summary, ANALEESE ANDREATTA is a very pleasant 43 y.o.-year old female with an underlying medical history of migraine headaches (followed by Dr. Everlena Cooper with [facility]), diabetes, hypertension, anemia, asthma, reflux disease, history of kidney stones, history of Bell's palsy, psoriasis,  vitamin D deficiency and morbid obesity with a BMI of over 50, who presents for re-evaluation of her prior diagnosis of obstructive sleep apnea (OSA). She had a HST in 03/2022 which showed mild OSA. She recalls that she was given the option to start PAP therapy. She has chronic difficulty going to sleep and staying asleep. She is encouraged to get re-evaluated with a lab-attended sleep study. I had a long chat with the patient about my findings and the diagnosis of sleep apnea  particularly OSA, its prognosis and treatment options. We talked about medical/conservative treatments, surgical interventions and non-pharmacological approaches for symptom control. I explained, in particular, the risks and ramifications of untreated moderate to severe OSA, especially with respect to developing cardiovascular disease down the road, including congestive heart failure (CHF), difficult to treat hypertension, cardiac arrhythmias (particularly A-fib), neurovascular complications including TIA, stroke and dementia. Even type 2 diabetes has, in part, been linked to untreated OSA. Symptoms of untreated OSA may include (but may not be limited to) daytime sleepiness, nocturia (i.e. frequent nighttime urination), memory problems, mood irritability and suboptimally controlled or worsening mood disorder such as depression and/or anxiety, lack of energy, lack of motivation, physical discomfort, as well as recurrent headaches, especially morning or nocturnal headaches. We talked about the importance of maintaining a healthy lifestyle and striving for healthy weight. In addition, we talked about the importance of striving for and maintaining good sleep hygiene. I recommended a sleep study at this time. I outlined the differences between a laboratory attended sleep study which is considered more comprehensive and accurate over the option of a home sleep test (HST); the latter may lead to underestimation of sleep disordered breathing in some  instances and does not help with diagnosing upper airway resistance syndrome and is not accurate enough to diagnose primary central sleep apnea typically. I outlined possible surgical and non-surgical treatment options of OSA, including the use of a positive airway pressure (PAP) device (i.e. CPAP, AutoPAP/APAP or BiPAP in certain circumstances), a custom-made dental device (aka oral appliance, which would require a referral to a specialist dentist or orthodontist typically, and is generally speaking not considered for patients with full dentures or edentulous state), upper airway surgical options, such as traditional UPPP (which is not considered a first-line treatment) or the Inspire device (hypoglossal nerve stimulator, which would involve a referral for consultation with an ENT surgeon, after careful selection, following inclusion criteria - also not first-line treatment). I explained the PAP treatment option to the patient in detail, as this is generally considered first-line treatment.  The patient indicated that she would be willing to try PAP therapy, if the need arises. I explained the importance of being compliant with PAP treatment, not only for insurance purposes but primarily to improve patient's symptoms symptoms, and for the patient's long term health benefit, including to reduce Her cardiovascular risks longer-term.    We will pick up our discussion about the next steps and treatment options after testing.  We will keep her posted as to the test results by phone call and/or MyChart messaging where possible.  We will plan to follow-up in sleep clinic accordingly as well.  I answered all her questions today and the patient was in  agreement.   I encouraged her to call with any interim questions, concerns, problems or updates or email [place] through MyChart.  Generally speaking, sleep test authorizations may take up to 2 weeks, sometimes less, sometimes longer, the patient is encouraged to get in touch  with [place] if they do not hear back from the sleep lab staff directly within the next 2 weeks.  Thank you very much for allowing me to participate in the care of this nice patient. If I can be of any further assistance to you please do not hesitate to call me at [phone].  Sincerely,   Huston Foley, MD, PhD

## 2023-09-24 ENCOUNTER — Encounter (HOSPITAL_BASED_OUTPATIENT_CLINIC_OR_DEPARTMENT_OTHER): Payer: Self-pay

## 2023-09-24 ENCOUNTER — Ambulatory Visit (HOSPITAL_BASED_OUTPATIENT_CLINIC_OR_DEPARTMENT_OTHER): Payer: Medicaid Other | Admitting: Physical Therapy

## 2023-09-25 ENCOUNTER — Encounter (HOSPITAL_BASED_OUTPATIENT_CLINIC_OR_DEPARTMENT_OTHER): Payer: Self-pay | Admitting: Family Medicine

## 2023-09-25 ENCOUNTER — Encounter (HOSPITAL_BASED_OUTPATIENT_CLINIC_OR_DEPARTMENT_OTHER): Payer: Self-pay | Admitting: *Deleted

## 2023-09-25 ENCOUNTER — Ambulatory Visit (HOSPITAL_BASED_OUTPATIENT_CLINIC_OR_DEPARTMENT_OTHER): Payer: Managed Care, Other (non HMO) | Admitting: Family Medicine

## 2023-09-25 VITALS — BP 141/87 | HR 101 | Ht 65.0 in | Wt 334.4 lb

## 2023-09-25 DIAGNOSIS — E119 Type 2 diabetes mellitus without complications: Secondary | ICD-10-CM

## 2023-09-25 DIAGNOSIS — R194 Change in bowel habit: Secondary | ICD-10-CM | POA: Insufficient documentation

## 2023-09-25 DIAGNOSIS — E1165 Type 2 diabetes mellitus with hyperglycemia: Secondary | ICD-10-CM

## 2023-09-25 DIAGNOSIS — I10 Essential (primary) hypertension: Secondary | ICD-10-CM

## 2023-09-25 DIAGNOSIS — K5900 Constipation, unspecified: Secondary | ICD-10-CM | POA: Insufficient documentation

## 2023-09-25 NOTE — Progress Notes (Signed)
    Procedures performed today:    None.  Independent interpretation of notes and tests performed by another provider:   None.  Brief History, Exam, Impression, and Recommendations:    BP (!) 141/87 (BP Location: Left Arm, Patient Position: Sitting, Cuff Size: Normal)   Pulse (!) 101   Ht 5\' 5"  (1.651 m)   Wt (!) 334 lb 6.4 oz (151.7 kg)   LMP 07/20/2018 (Exact Date)   SpO2 99%   BMI 55.65 kg/m   Diabetic eye exam (HCC) -     Ambulatory referral to Ophthalmology  Type 2 diabetes mellitus with hyperglycemia, without long-term current use of insulin (HCC) Assessment & Plan: Patient continues with Mounjaro 5 mg dose.  She has been doing well with medication.  Has noted some nausea, GI upset, possible constipation related to medication.  Generally feels that she has been doing well with medication. Last hemoglobin A1c above goal at 7.9%.  Previously did discuss stopping glipizide with increase in Mounjaro dose.  We will need to ensure that moving forward to has transition to stop glipizide with increase in Mounjaro dose Will plan for follow-up in about 4 to 6 weeks to assess progress, check hemoglobin A1c at that time   Constipation, unspecified constipation type Assessment & Plan: Ongoing issues additionally, did have worsening recently following acute respiratory illness.  She notes that she has been trying stool softener without significant improvement, has also utilize a laxative with some improvement noted. We discussed potential causes for observed constipation.  Could be related to a few of her current medications including oxycodone, Mounjaro.  Additionally with recent OTC medications used for illness, there could be degree of dehydration playing a role as well. Recommend that she assess current fiber intake and if found to be low, would recommend gradual increase in dietary fiber.  Recommend corresponding gradual increase in water intake as well.  Would be cautious with use  of opioid as this can also predispose to constipation.  If continuing to have issues and if felt to be related to [facility], could adjust medication accordingly.  Can continue utilizing stool softener, did caution on use of laxative.   Primary hypertension Assessment & Plan: Blood pressure elevated in office today, did improve slightly on recheck.  Can continue with current regimen, continue with scheduled follow-up with cardiology No changes to medications today Recommend intermittent monitoring of blood pressure at home, DASH diet   Return in about 6 weeks (around 11/06/2023) for diabetes.   ___________________________________________ Kevan Prouty de [place], MD, ABFM, CAQSM Primary Care and Sports Medicine [facility]

## 2023-09-25 NOTE — Assessment & Plan Note (Addendum)
 Patient continues with Mounjaro 5 mg dose.  She has been doing well with medication.  Has noted some nausea, GI upset, possible constipation related to medication.  Generally feels that she has been doing well with medication. Last hemoglobin A1c above goal at 7.9%.  Previously did discuss stopping glipizide with increase in Mounjaro dose.  We will need to ensure that moving forward to has transition to stop glipizide with increase in Mounjaro dose Will plan for follow-up in about 4 to 6 weeks to assess progress, check hemoglobin A1c at that time

## 2023-09-25 NOTE — Assessment & Plan Note (Signed)
 Ongoing issues additionally, did have worsening recently following acute respiratory illness.  She notes that she has been trying stool softener without significant improvement, has also utilize a laxative with some improvement noted. We discussed potential causes for observed constipation.  Could be related to a few of her current medications including oxycodone, Mounjaro.  Additionally with recent OTC medications used for illness, there could be degree of dehydration playing a role as well. Recommend that she assess current fiber intake and if found to be low, would recommend gradual increase in dietary fiber.  Recommend corresponding gradual increase in water intake as well.  Would be cautious with use of opioid as this can also predispose to constipation.  If continuing to have issues and if felt to be related to [facility], could adjust medication accordingly.  Can continue utilizing stool softener, did caution on use of laxative.

## 2023-09-25 NOTE — Assessment & Plan Note (Signed)
 Blood pressure elevated in office today, did improve slightly on recheck.  Can continue with current regimen, continue with scheduled follow-up with cardiology No changes to medications today Recommend intermittent monitoring of blood pressure at home, DASH diet

## 2023-09-25 NOTE — Patient Instructions (Signed)
  Medication Instructions:  Your physician recommends that you continue on your current medications as directed. Please refer to the Current Medication list given to you today. --If you need a refill on any your medications before your next appointment, please call your pharmacy first. If no refills are authorized on file call the office.--   Follow-Up: Your next appointment:   Your physician recommends that you schedule a follow-up appointment in: 6 week follow up  with Dr. de Peru  You will receive a text message or e-mail with a link to a survey about your care and experience with [place] today! We would greatly appreciate your feedback!   Thanks for letting us be apart of your health journey!!  Primary Care and Sports Medicine   Dr. Ceasar Mons Peru   We encourage you to activate your patient portal called "MyChart".  Sign up information is provided on this After Visit Summary.  MyChart is used to connect with patients for Virtual Visits (Telemedicine).  Patients are able to view lab/test results, encounter notes, upcoming appointments, etc.  Non-urgent messages can be sent to your provider as well. To learn more about what you can do with MyChart, please visit --  [email or web address].

## 2023-09-26 ENCOUNTER — Ambulatory Visit (HOSPITAL_BASED_OUTPATIENT_CLINIC_OR_DEPARTMENT_OTHER): Payer: Medicaid Other | Admitting: Physical Therapy

## 2023-09-26 ENCOUNTER — Encounter (HOSPITAL_BASED_OUTPATIENT_CLINIC_OR_DEPARTMENT_OTHER): Payer: Self-pay | Admitting: Physical Therapy

## 2023-09-26 DIAGNOSIS — R2689 Other abnormalities of gait and mobility: Secondary | ICD-10-CM

## 2023-09-26 DIAGNOSIS — M5459 Other low back pain: Secondary | ICD-10-CM

## 2023-09-26 DIAGNOSIS — M6281 Muscle weakness (generalized): Secondary | ICD-10-CM

## 2023-09-26 DIAGNOSIS — R29898 Other symptoms and signs involving the musculoskeletal system: Secondary | ICD-10-CM

## 2023-09-26 DIAGNOSIS — M25561 Pain in right knee: Secondary | ICD-10-CM | POA: Diagnosis not present

## 2023-09-26 NOTE — Therapy (Signed)
 OUTPATIENT PHYSICAL THERAPY THORACOLUMBAR TREATMENT   Patient Name: Molly Archer MRN: [id number] DOB:Apr 12, 1981, 43 y.o., female Today's Date: 09/26/2023  END OF SESSION:  PT End of Session - 09/26/23 1556     Visit Number 5    Number of Visits 16    Date for PT Re-Evaluation 10/24/23    Authorization Type [place] MEDICAID [employer]    PT Start Time 1536    PT Stop Time 1612    PT Time Calculation (min) 36 min    Activity Tolerance Patient tolerated treatment well    Behavior During Therapy [facility] for tasks assessed/performed               Past Medical History:  Diagnosis Date   Anemia    Asthma    only uses inhaler in fall and spring   Constipation    GERD (gastroesophageal reflux disease)    diet controlled, occ. uses omeprazole   Headache    otc med prn   History of kidney stones    Hx of Bell's palsy June 2013   Hypertension    currently no meds   Menometrorrhagia    Prediabetes    Psoriasis    Shortness of breath dyspnea    with exercise/exertion   Vitamin D deficiency    Past Surgical History:  Procedure Laterality Date   COMBINED HYSTEROSCOPY DIAGNOSTIC / D&C N/A 2011   Benign secretory endometrium   CYSTOSCOPY WITH STENT PLACEMENT Left 10/19/2014   Procedure: CYSTOSCOPY WITH STENT PLACEMENT;  Surgeon: Crist Fat, MD;  Location: [facility];  Service: Urology;  Laterality: Left;   DILATION AND CURETTAGE OF UTERUS  10/2003   Polyp, mild atypia, simple and complex hyperplasia,   DILATION AND CURETTAGE OF UTERUS  11/2003   Simple and complex hyperplasia   DILATION AND CURETTAGE OF UTERUS  2011   DILATION AND CURETTAGE OF UTERUS N/A 12/14/2014   Procedure: DILATATION AND CURETTAGE;  Surgeon: Catalina Antigua, MD;  Location: [facility];  Service: Gynecology;  Laterality: N/A;   HOLMIUM LASER APPLICATION Left 10/19/2014   Procedure: HOLMIUM LASER APPLICATION;  Surgeon: Crist Fat, MD;  Location: [facility];  Service: Urology;  Laterality:  Left;   HYSTEROSCOPY WITH D & C N/A 07/21/2018   Procedure: DILATATION AND CURETTAGE /HYSTEROSCOPY;  Surgeon: Adam Phenix, MD;  Location: [facility];  Service: Gynecology;  Laterality: N/A;   INTRAUTERINE DEVICE (IUD) INSERTION N/A 12/14/2014   Procedure: INTRAUTERINE DEVICE (IUD) INSERTION;  Surgeon: Catalina Antigua, MD;  [facility];  Service: Gynecology;  Laterality: N/A;   STONE EXTRACTION WITH BASKET Left 10/19/2014   Procedure: STONE EXTRACTION WITH BASKET;  Surgeon: Crist Fat, MD;  [facility];  Service: Urology;  Laterality: Left;   WISDOM TOOTH EXTRACTION     Patient Active Problem List   Diagnosis Date Noted   Constipation 09/25/2023   Sinusitis 09/09/2023   Early awakening 08/26/2023   Lumbar radiculopathy 07/17/2023   Acute cough 06/25/2023   Strep throat 06/18/2023   Adjustment disorder with anxious mood 05/16/2023   Primary osteoarthritis of right knee 05/15/2023   Right hip pain 05/15/2023   Spondylosis of lumbar spine 05/15/2023   Cervical spondylolysis 04/16/2023   Cervical sprain 04/16/2023   Encounter for screening for other metabolic disorders 04/16/2023   Lumbar sprain 04/16/2023   Left wrist sprain 04/16/2023   Haglund's deformity of left heel 04/16/2023   Derangement of left knee 04/16/2023   Vaginal discharge 04/16/2023  Intracranial hypertension 03/04/2023   Diabetes mellitus (HCC) 03/04/2023   Phase of life problem 03/04/2023   Primary insomnia 09/19/2022   Migraine without aura and without status migrainosus, not intractable 05/02/2020   Complex tear of lateral meniscus of left knee as current injury 12/22/2019   Iron overload 12/21/2019   Medication management contract signed 12/09/2019   Primary osteoarthritis of left knee 11/17/2019   Patellofemoral pain syndrome of left knee 09/08/2019   Dermatitis 12/18/2018   Vitamin D deficiency 09/25/2018   Absolute anemia 09/25/2018   Class 3 severe obesity with serious comorbidity and  body mass index (BMI) of 50.0 to 59.9 in adult [facility]) 09/25/2018   IUD strings lost 09/24/2018   Thickened endometrium 07/20/2018   History of endometrial hyperplasia 07/20/2018   Morbid obesity with BMI of 50.0-59.9, adult (HCC) 07/20/2018   Abnormal uterine bleeding (AUB)    Hypertension 02/25/2012   Hx of Bell's palsy 02/25/2012    PCP: Raymond de Peru MD  REFERRING PROVIDER: Ranelle Oyster, MD  REFERRING DIAG: [phone] (ICD-10-CM) - Spondylosis of lumbar spine M54.16 (ICD-10-CM) - Lumbar radiculopathy Diagnosis: Chronic low back pain and radiculopathy RLE, L4   Rx: Eval and Treat. McKenzie technique, low back and core strengthening and lengthening  Rationale for Evaluation and Treatment: Rehabilitation  THERAPY DIAG:  Other low back pain  Right knee pain, unspecified chronicity  Other abnormalities of gait and mobility  Muscle weakness (generalized)  Other symptoms and signs involving the musculoskeletal system  ONSET DATE: 3 years  SUBJECTIVE:                                                                                                                                                                                           SUBJECTIVE STATEMENT: Patient states she has been very sore today and had a rough day.  She has been constipated lately and feels nauseated.  Pt saw MD yesterday and she was told to increase fiber intake.  Pt missed her aquatic appt on Tuesday due to work training.  Pt states she had increased pain after prior Rx which was better by the following AM.  Her pain is worse at the end of the day into the evening.  Pt has performed her HEP though has not tried any of the prone press ups.   PERTINENT HISTORY:  Increased BMI, chronic pain, HTN, history of Bell's palsy, anemia, headaches, dyspnea with exertion   PAIN:  Are you having pain? Yes: NPRS scale: 8/10  /  0/10  Pain location: central lower thoracic/upper lumbar  /  R knee Pain description:  tingling, throbbing Aggravating factors: standing,  walking, driving Relieving factors: sitting sometimes, propping something under knee.  PRECAUTIONS: None  WEIGHT BEARING RESTRICTIONS: No  FALLS:  Has patient fallen in last 6 months? 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 Redemonstrated right foraminal disc protrusion at L4-L5 which likely contacts the exiting right L4 nerve root, unchanged. 2. Moderate bilateral facet degenerative change at L3-L4 and L5-S1, unchanged.  MRI 06/06/23 R knee IMPRESSION: 1. Tricompartmental osteoarthritis, with substantial lateral marginal spurring of the patella. 2. Moderate knee effusion. 3. Thickened distal iliotibial band but without surrounding edema to further suggest iliotibial band syndrome.   TODAY'S TREATMENT:                                                                                                                               Reviewed pt presentation, pain levels, response to prior Rx, and HEP compliance.    PPU 2 x 10 reps  PT educated pt in correct performance of TrA contraction.   Seated marching with TrA with RTB 2x10 Seated clams with TrA with RTB 2x10 Standing rows with RTB with TrA with retraction 2x10 Standing shoulder extension with TrA with retraction 2x10 UE  V's with pink ball with TrA x 10 each seated and standing   PATIENT EDUCATION:  Education details: PT educated pt to try PPU at home for 10 reps when having pain to determine response to pain.  PT instructed pt to stop PPU if having increased pain or pain traveling down leg.  Exercise form, HEP, and POC. Person educated: Patient Education method: Explanation, Demonstration, and Handouts Education comprehension:  verbalized understanding, returned demonstration, verbal cues required, and tactile cues required  HOME EXERCISE PROGRAM: Access Code: [email or web address] URL: [email or web address] Date: 08/15/2023 Prepared by: Greig Castilla Zaunegger  Exercises - Seated Long Arc Quad  - 2-3 x daily - 7 x weekly - 1-2 sets - 10 reps - 5 second hold - Seated Hamstring Set  - 2-3 x daily - 7 x weekly - 10 reps - 5-10 second hold - Supine Transversus Abdominis Bracing - Hands on Stomach  - 2 x daily - 7 x weekly - 2 sets - 10 reps - Hooklying Clamshell with Resistance  - 1 x daily - 4-5 x weekly - 2 sets - 10 reps  ASSESSMENT:  CLINICAL IMPRESSION: Pt presents to Rx with increased back pain today.  She has been constipated and states that may be affecting her back pain as well.  She saw MD yesterday.  Pt may return to MD if she continues to be constipated.  Pt has no knee pain.  Pt had no change in pain with PPU in the clinic.  Pt unable to lie in supine due to stomach discomfort.  Pt performed seated exercises instead  of supine exercises due to stomach discomfort.  PT also performed standing T-band exercises to work on core/postural strength/stabilization.  Pt reported improved pain from 8/10 before Rx to 6/10 after Rx.  She should benefit from continued skilled PT including land based and aquatic therapy to address impairments and goals and improve overall function.      OBJECTIVE IMPAIRMENTS: Abnormal gait, decreased activity tolerance, decreased balance, decreased endurance, decreased mobility, difficulty walking, decreased ROM, decreased strength, increased muscle spasms, impaired flexibility, improper body mechanics, postural dysfunction, obesity, and pain.   ACTIVITY LIMITATIONS: carrying, lifting, bending, standing, squatting, stairs, transfers, reach over head, hygiene/grooming, locomotion level, and caring for others  PARTICIPATION LIMITATIONS: meal prep, cleaning, laundry, driving, shopping, community  activity, occupation, and yard work  PERSONAL FACTORS: Fitness, Time since onset of injury/illness/exacerbation, and 3+ comorbidities: Increased BMI, chronic pain, HTN  are also affecting patient's functional outcome.   REHAB POTENTIAL: Good  CLINICAL DECISION MAKING: Stable/uncomplicated  EVALUATION COMPLEXITY: Low   GOALS: Goals reviewed with patient? Yes  SHORT TERM GOALS: Target date: 09/12/2023    Patient will be independent with HEP in order to improve functional outcomes. Baseline:  Goal status: INITIAL  2.  Patient will report at least 25% improvement in symptoms for improved quality of life. Baseline:  Goal status: INITIAL   LONG TERM GOALS: Target date: 10/10/2023    Patient will report at least 75% improvement in symptoms for improved quality of life. Baseline:  Goal status: INITIAL  2.  Patient will improve LEFS score by at least 18 points in order to indicate improved tolerance to activity. Baseline: 14/80 Goal status: INITIAL  3.  Patient will be able to complete 5x STS in under 14 seconds in order to demonstrate improved functional strength. Baseline: 24. 46 seconds with some R UE support on chair Goal status: INITIAL  4.  Patient will be able to ambulate at least 400 feet in in order to demonstrate improved tolerance to activity. Baseline: 328 feet Goal status: INITIAL  5.  Patient will demonstrate grade of 5/5 MMT or 10lb increase in force in all tested musculature as evidence of improved strength to assist with stair ambulation and gait.   Baseline: see above Goal status: INITIAL     PLAN:  PT FREQUENCY: 1-2x/week  PT DURATION: 8 weeks starting in Feb 2025 due to scheduling  PLANNED INTERVENTIONS: 97164- PT Re-evaluation, 97110-Therapeutic exercises, 97530- Therapeutic activity, 97112- Neuromuscular re-education, 97535- Self Care, 65784- Manual therapy, [phone]- Gait training, [phone]- Orthotic Fit/training, [phone]- Canalith repositioning,  [id number]- Aquatic Therapy, [phone]- Splinting, Patient/Family education, Balance training, Stair training, Taping, Dry Needling, Joint mobilization, Joint manipulation, Spinal manipulation, Spinal mobilization, Scar mobilization, and DME instructions.  PLAN FOR NEXT SESSION: core and postural strength, LE strength and progress as tolerated.  Cont with land and aquatic therapy.     Audie Clear III PT, DPT 09/26/23 5:47 PM

## 2023-10-01 ENCOUNTER — Encounter (HOSPITAL_BASED_OUTPATIENT_CLINIC_OR_DEPARTMENT_OTHER): Payer: Self-pay | Admitting: Physical Therapy

## 2023-10-01 ENCOUNTER — Other Ambulatory Visit (HOSPITAL_BASED_OUTPATIENT_CLINIC_OR_DEPARTMENT_OTHER): Payer: Self-pay

## 2023-10-01 ENCOUNTER — Encounter: Payer: Self-pay | Admitting: Gastroenterology

## 2023-10-01 ENCOUNTER — Emergency Department (HOSPITAL_BASED_OUTPATIENT_CLINIC_OR_DEPARTMENT_OTHER)

## 2023-10-01 ENCOUNTER — Ambulatory Visit (HOSPITAL_BASED_OUTPATIENT_CLINIC_OR_DEPARTMENT_OTHER): Payer: Medicaid Other | Attending: Physical Medicine & Rehabilitation | Admitting: Physical Therapy

## 2023-10-01 ENCOUNTER — Encounter (HOSPITAL_BASED_OUTPATIENT_CLINIC_OR_DEPARTMENT_OTHER): Payer: Self-pay | Admitting: Emergency Medicine

## 2023-10-01 ENCOUNTER — Emergency Department (HOSPITAL_BASED_OUTPATIENT_CLINIC_OR_DEPARTMENT_OTHER): Admitting: Radiology

## 2023-10-01 ENCOUNTER — Other Ambulatory Visit: Payer: Self-pay

## 2023-10-01 ENCOUNTER — Emergency Department (HOSPITAL_BASED_OUTPATIENT_CLINIC_OR_DEPARTMENT_OTHER)
Admission: EM | Admit: 2023-10-01 | Discharge: 2023-10-01 | Disposition: A | Discharge status: Home / Self Care | Attending: Emergency Medicine | Admitting: Emergency Medicine

## 2023-10-01 DIAGNOSIS — Z6841 Body Mass Index (BMI) 40.0 and over, adult: Secondary | ICD-10-CM | POA: Diagnosis not present

## 2023-10-01 DIAGNOSIS — M6281 Muscle weakness (generalized): Secondary | ICD-10-CM | POA: Insufficient documentation

## 2023-10-01 DIAGNOSIS — M25561 Pain in right knee: Secondary | ICD-10-CM | POA: Diagnosis present

## 2023-10-01 DIAGNOSIS — M5459 Other low back pain: Secondary | ICD-10-CM | POA: Insufficient documentation

## 2023-10-01 DIAGNOSIS — E669 Obesity, unspecified: Secondary | ICD-10-CM | POA: Insufficient documentation

## 2023-10-01 DIAGNOSIS — R109 Unspecified abdominal pain: Secondary | ICD-10-CM | POA: Diagnosis present

## 2023-10-01 DIAGNOSIS — R2689 Other abnormalities of gait and mobility: Secondary | ICD-10-CM | POA: Diagnosis present

## 2023-10-01 DIAGNOSIS — R29898 Other symptoms and signs involving the musculoskeletal system: Secondary | ICD-10-CM | POA: Diagnosis present

## 2023-10-01 DIAGNOSIS — R1013 Epigastric pain: Secondary | ICD-10-CM | POA: Insufficient documentation

## 2023-10-01 LAB — URINALYSIS, ROUTINE W REFLEX MICROSCOPIC
Bilirubin Urine: NEGATIVE
Glucose, UA: NEGATIVE mg/dL
Hgb urine dipstick: NEGATIVE
Nitrite: NEGATIVE
Protein, ur: 30 mg/dL — AB
Specific Gravity, Urine: 1.034 — ABNORMAL HIGH (ref 1.005–1.030)
pH: 6 (ref 5.0–8.0)

## 2023-10-01 LAB — CBC
HCT: 39.7 % (ref 36.0–46.0)
Hemoglobin: 12.9 g/dL (ref 12.0–15.0)
MCH: 27.3 pg (ref 26.0–34.0)
MCHC: 32.5 g/dL (ref 30.0–36.0)
MCV: 84.1 fL (ref 80.0–100.0)
Platelets: 419 10*3/uL — ABNORMAL HIGH (ref 150–400)
RBC: 4.72 MIL/uL (ref 3.87–5.11)
RDW: 15.2 % (ref 11.5–15.5)
WBC: 7.2 10*3/uL (ref 4.0–10.5)
nRBC: 0 % (ref 0.0–0.2)

## 2023-10-01 LAB — COMPREHENSIVE METABOLIC PANEL
ALT: 22 U/L (ref 0–44)
AST: 13 U/L — ABNORMAL LOW (ref 15–41)
Albumin: 4.2 g/dL (ref 3.5–5.0)
Alkaline Phosphatase: 37 U/L — ABNORMAL LOW (ref 38–126)
Anion gap: 10 (ref 5–15)
BUN: 12 mg/dL (ref 6–20)
CO2: 25 mmol/L (ref 22–32)
Calcium: 8.8 mg/dL — ABNORMAL LOW (ref 8.9–10.3)
Chloride: 103 mmol/L (ref 98–111)
Creatinine, Ser: 0.65 mg/dL (ref 0.44–1.00)
GFR, Estimated: >60 mL/min (ref >60)
Glucose, Bld: 123 mg/dL — ABNORMAL HIGH (ref 70–99)
Potassium: 3.9 mmol/L (ref 3.5–5.1)
Sodium: 138 mmol/L (ref 135–145)
Total Bilirubin: 0.6 mg/dL (ref 0.0–1.2)
Total Protein: 6.8 g/dL (ref 6.5–8.1)

## 2023-10-01 LAB — LIPASE, BLOOD: Lipase: 20 U/L (ref 11–51)

## 2023-10-01 LAB — PREGNANCY, URINE: Preg Test, Ur: NEGATIVE

## 2023-10-01 MED ORDER — SULFAMETHOXAZOLE-TRIMETHOPRIM 800-160 MG PO TABS
1.0000 | ORAL_TABLET | Freq: Two times a day (BID) | ORAL | 0 refills | Status: AC
Start: 2023-10-01 — End: 2023-10-09
  Filled 2023-10-01: qty 6, 3d supply, fill #0
  Filled 2023-10-02: qty 14, 7d supply, fill #0

## 2023-10-01 MED ORDER — ONDANSETRON HCL 4 MG/2ML IJ SOLN
4.0000 mg | Freq: Once | INTRAMUSCULAR | Status: AC
  Administered 2023-10-01: 4 mg via INTRAVENOUS
  Filled 2023-10-01: qty 2

## 2023-10-01 MED ORDER — LACTATED RINGERS IV BOLUS
500.0000 mL | Freq: Once | INTRAVENOUS | Status: AC
  Administered 2023-10-01: 500 mL via INTRAVENOUS

## 2023-10-01 MED ORDER — PANTOPRAZOLE SODIUM 40 MG PO TBEC
40.0000 mg | DELAYED_RELEASE_TABLET | Freq: Every day | ORAL | 0 refills | Status: DC
  Filled 2023-10-01: qty 30, 30d supply, fill #0

## 2023-10-01 MED ORDER — FENTANYL CITRATE PF 50 MCG/ML IJ SOSY
50.0000 ug | PREFILLED_SYRINGE | Freq: Once | INTRAMUSCULAR | Status: AC
  Administered 2023-10-01: 50 ug via INTRAVENOUS
  Filled 2023-10-01: qty 1

## 2023-10-01 NOTE — ED Notes (Signed)
Awaiting medications from pharmacy.

## 2023-10-01 NOTE — ED Notes (Signed)
 Dc instructions reviewed with patient. Patient voiced understanding. Dc with belongings.

## 2023-10-01 NOTE — Therapy (Signed)
 OUTPATIENT PHYSICAL THERAPY THORACOLUMBAR TREATMENT   Patient Name: Molly Archer MRN: [id number] DOB:Mar 09, 1981, 43 y.o., female Today's Date: 10/01/2023  END OF SESSION:  PT End of Session - 10/01/23 1702     Visit Number 6    Number of Visits 16    Date for PT Re-Evaluation 10/24/23    Authorization Type [place] MEDICAID [employer]    PT Start Time 1655    PT Stop Time 1733    PT Time Calculation (min) 38 min    Activity Tolerance Patient tolerated treatment well            Past Medical History:  Diagnosis Date   Anemia    Asthma    only uses inhaler in fall and spring   Constipation    GERD (gastroesophageal reflux disease)    diet controlled, occ. uses omeprazole   Headache    otc med prn   History of kidney stones    Hx of Bell's palsy June 2013   Hypertension    currently no meds   Menometrorrhagia    Prediabetes    Psoriasis    Shortness of breath dyspnea    with exercise/exertion   Vitamin D deficiency    Past Surgical History:  Procedure Laterality Date   COMBINED HYSTEROSCOPY DIAGNOSTIC / D&C N/A 2011   Benign secretory endometrium   CYSTOSCOPY WITH STENT PLACEMENT Left 10/19/2014   Procedure: CYSTOSCOPY WITH STENT PLACEMENT;  Surgeon: Crist Fat, MD;  [facility]: [facility];  Service: Urology;  Laterality: Left;   DILATION AND CURETTAGE OF UTERUS  10/2003   Polyp, mild atypia, simple and complex hyperplasia,   DILATION AND CURETTAGE OF UTERUS  11/2003   Simple and complex hyperplasia   DILATION AND CURETTAGE OF UTERUS  2011   DILATION AND CURETTAGE OF UTERUS N/A 12/14/2014   Procedure: DILATATION AND CURETTAGE;  Surgeon: Catalina Antigua, MD;  Location: [facility];  Service: Gynecology;  Laterality: N/A;   HOLMIUM LASER APPLICATION Left 10/19/2014   Procedure: HOLMIUM LASER APPLICATION;  Surgeon: Crist Fat, MD;  Location: [facility];  Service: Urology;  Laterality: Left;   HYSTEROSCOPY WITH D & C N/A 07/21/2018   Procedure: DILATATION  AND CURETTAGE /HYSTEROSCOPY;  Surgeon: Adam Phenix, MD;  Location: [facility];  Service: Gynecology;  Laterality: N/A;   INTRAUTERINE DEVICE (IUD) INSERTION N/A 12/14/2014   Procedure: INTRAUTERINE DEVICE (IUD) INSERTION;  Surgeon: Catalina Antigua, MD;  Location: [facility];  Service: Gynecology;  Laterality: N/A;   STONE EXTRACTION WITH BASKET Left 10/19/2014   Procedure: STONE EXTRACTION WITH BASKET;  Surgeon: Crist Fat, MD;  [facility];  Service: Urology;  Laterality: Left;   WISDOM TOOTH EXTRACTION     Patient Active Problem List   Diagnosis Date Noted   Constipation 09/25/2023   Sinusitis 09/09/2023   Early awakening 08/26/2023   Lumbar radiculopathy 07/17/2023   Acute cough 06/25/2023   Strep throat 06/18/2023   Adjustment disorder with anxious mood 05/16/2023   Primary osteoarthritis of right knee 05/15/2023   Right hip pain 05/15/2023   Spondylosis of lumbar spine 05/15/2023   Cervical spondylolysis 04/16/2023   Cervical sprain 04/16/2023   Encounter for screening for other metabolic disorders 04/16/2023   Lumbar sprain 04/16/2023   Left wrist sprain 04/16/2023   Haglund's deformity of left heel 04/16/2023   Derangement of left knee 04/16/2023   Vaginal discharge 04/16/2023   Intracranial hypertension 03/04/2023   Diabetes mellitus (HCC) 03/04/2023   Phase of  life problem 03/04/2023   Primary insomnia 09/19/2022   Migraine without aura and without status migrainosus, not intractable 05/02/2020   Complex tear of lateral meniscus of left knee as current injury 12/22/2019   Iron overload 12/21/2019   Medication management contract signed 12/09/2019   Primary osteoarthritis of left knee 11/17/2019   Patellofemoral pain syndrome of left knee 09/08/2019   Dermatitis 12/18/2018   Vitamin D deficiency 09/25/2018   Absolute anemia 09/25/2018   Class 3 severe obesity with serious comorbidity and body mass index (BMI) of 50.0 to 59.9 in adult [facility]) 09/25/2018   IUD  strings lost 09/24/2018   Thickened endometrium 07/20/2018   History of endometrial hyperplasia 07/20/2018   Morbid obesity with BMI of 50.0-59.9, adult (HCC) 07/20/2018   Abnormal uterine bleeding (AUB)    Hypertension 02/25/2012   Hx of Bell's palsy 02/25/2012    PCP: Raymond de Peru MD  REFERRING PROVIDER: Ranelle Oyster, MD  REFERRING DIAG: [phone] (ICD-10-CM) - Spondylosis of lumbar spine M54.16 (ICD-10-CM) - Lumbar radiculopathy Diagnosis: Chronic low back pain and radiculopathy RLE, L4   Rx: Eval and Treat. McKenzie technique, low back and core strengthening and lengthening  Rationale for Evaluation and Treatment: Rehabilitation  THERAPY DIAG:  Other low back pain  Other abnormalities of gait and mobility  Muscle weakness (generalized)  ONSET DATE: 3 years  SUBJECTIVE:                                                                                                                                                                                           SUBJECTIVE STATEMENT: Patient states she continues with constipation and nausea.   Went to ED today. Has been referred to GI dr and was started on another round of antibiotics for UTI.  R knee has "been fine", no pain.    PERTINENT HISTORY:  Increased BMI, chronic pain, HTN, history of Bell's palsy, anemia, headaches, dyspnea with exertion   PAIN:  Are you having pain? Yes: NPRS scale: 7/10  Pain location: central lower thoracic/upper lumbar  Pain description: ache Aggravating factors: standing, walking, driving Relieving factors: sitting in recliner, ice  PRECAUTIONS: None  WEIGHT BEARING RESTRICTIONS: No  FALLS:  Has patient fallen in last 6 months? 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 Redemonstrated right foraminal disc protrusion at L4-L5 which likely contacts the exiting right L4 nerve root, unchanged. 2. Moderate bilateral facet degenerative change  at L3-L4 and L5-S1, unchanged.  MRI 06/06/23 R knee IMPRESSION: 1. Tricompartmental osteoarthritis, with substantial lateral marginal spurring of the patella. 2. Moderate knee effusion. 3. Thickened distal iliotibial band but without surrounding edema to further suggest iliotibial band syndrome.   TODAY'S TREATMENT:                                                                                                                              [facility] Adult PT Treatment:                                                DATE: 10/01/23 Pt seen for aquatic therapy today.  Treatment took place in water 3.5-4.75 ft in depth at the [facility] pool. Temp of water was 91.  Pt entered/exited the pool via stairs  with bilat rail. * walking forward / backward unsupported, with arm swing * side stepping with arm addct/ abdct with rainbow hand floats * marching with reciprocal row motion with rainbow hand floats * farmer carry with single yellow hand float at side walking backwards  * relaxed squat at wall * wide stance with horiz abdct/ addct with rainbow hand floats * TrA set with 1/2 hollow noodle pull down to thighs in staggered stance x 10 each  * staggered stance with kick board row x 10 each  * straddling noodle and holding corner - cycling and hip abdct/ addct    Pt requires the buoyancy and hydrostatic pressure of water for support, and to offload joints by unweighting joint load by at least 50 % in navel deep water and by at least 75-80% in chest to neck deep water.  Viscosity of the water is needed for resistance of strengthening. Water current perturbations provides challenge to standing balance requiring increased core activation.   PATIENT EDUCATION:  Education details:  Exercise form,  HEP, relaxation breathing technique Person educated: Patient Education method: Solicitor,  Education comprehension: verbalized understanding, returned demonstration, verbal cues required, and tactile cues required  HOME EXERCISE PROGRAM: Access Code: [email or web address] URL: [email or web address]   ASSESSMENT:  CLINICAL IMPRESSION: Pt reported unsettle stomach throughout session, but overall pain level in back decreased 3 points by end of session. Tolerated all exercises well.   She should benefit from continued skilled PT including land based and aquatic therapy to address impairments and goals and improve overall function.  Therapist to address STG next session.       OBJECTIVE IMPAIRMENTS: Abnormal gait, decreased activity tolerance, decreased balance, decreased endurance, decreased mobility, difficulty walking, decreased ROM, decreased strength, increased muscle spasms, impaired flexibility, improper body mechanics, postural dysfunction, obesity, and pain.   ACTIVITY LIMITATIONS: carrying, lifting, bending, standing, squatting, stairs, transfers, reach over head, hygiene/grooming, locomotion level, and caring for others  PARTICIPATION LIMITATIONS: meal prep, cleaning, laundry, driving, shopping, community activity,  occupation, and yard work  PERSONAL FACTORS: Fitness, Time since onset of injury/illness/exacerbation, and 3+ comorbidities: Increased BMI, chronic pain, HTN  are also affecting patient's functional outcome.   REHAB POTENTIAL: Good  CLINICAL DECISION MAKING: Stable/uncomplicated  EVALUATION COMPLEXITY: Low   GOALS: Goals reviewed with patient? Yes  SHORT TERM GOALS: Target date: 09/12/2023    Patient will be independent with HEP in order to improve functional outcomes. Baseline:  Goal status: INITIAL  2.  Patient will report at least 25% improvement in symptoms for improved quality of life. Baseline:  Goal status: INITIAL   LONG TERM  GOALS: Target date: 10/10/2023    Patient will report at least 75% improvement in symptoms for improved quality of life. Baseline:  Goal status: INITIAL  2.  Patient will improve LEFS score by at least 18 points in order to indicate improved tolerance to activity. Baseline: 14/80 Goal status: INITIAL  3.  Patient will be able to complete 5x STS in under 14 seconds in order to demonstrate improved functional strength. Baseline: 24. 46 seconds with some R UE support on chair Goal status: INITIAL  4.  Patient will be able to ambulate at least 400 feet in in order to demonstrate improved tolerance to activity. Baseline: 328 feet Goal status: INITIAL  5.  Patient will demonstrate grade of 5/5 MMT or 10lb increase in force in all tested musculature as evidence of improved strength to assist with stair ambulation and gait.   Baseline: see above Goal status: INITIAL     PLAN:  PT FREQUENCY: 1-2x/week  PT DURATION: 8 weeks starting in Feb 2025 due to scheduling  PLANNED INTERVENTIONS: 97164- PT Re-evaluation, 97110-Therapeutic exercises, 97530- Therapeutic activity, 97112- Neuromuscular re-education, 97535- Self Care, 16109- Manual therapy, [phone]- Gait training, [phone]- Orthotic Fit/training, [phone]- Canalith repositioning, [id number]- Aquatic Therapy, [phone]- Splinting, Patient/Family education, Balance training, Stair training, Taping, Dry Needling, Joint mobilization, Joint manipulation, Spinal manipulation, Spinal mobilization, Scar mobilization, and DME instructions.  PLAN FOR NEXT SESSION: core and postural strength, LE strength and progress as tolerated.  Cont with land and aquatic therapy.    Mayer Camel, PTA 10/01/23 5:40 PM [facility] [address] [place], [place], [address] Phone: [phone]   Fax:  [phone]

## 2023-10-01 NOTE — ED Provider Notes (Signed)
 [place] EMERGENCY DEPARTMENT AT [facility] Provider Note   CSN: [id number] Arrival date & time: 10/01/23  5638     History  Chief Complaint  Patient presents with   Abdominal Pain    Molly Archer is a 43 y.o. female.  HPI 43 year old female presents today complaining of abdominal pain that began February 13.  She states she had been on doxycycline for an upper respiratory infection.  She began having symptoms after being on the doxycycline.  She had some diarrhea, nausea, vomiting.  She is also on [facility] which she started approximately 6 weeks prior to the symptoms beginning.  She takes the shots on Tuesday.  She has not no correlation of the symptoms with these.  She has been having ongoing abdominal pain since that time.  It is worsened with p.o. intake and she gets nausea and has vomited some with this.  Pain is currently 7-8 out of 10.  She has not had prior abdominal surgeries.    Home Medications Prior to Admission medications   Medication Sig Start Date End Date Taking? Authorizing Provider  pantoprazole (PROTONIX) 40 MG tablet Take 1 tablet (40 mg total) by mouth daily. 10/01/23  Yes Margarita Grizzle, MD  sulfamethoxazole-trimethoprim (BACTRIM DS) 800-160 MG tablet Take 1 tablet by mouth 2 (two) times daily for 7 days. 10/01/23 10/08/23 Yes Margarita Grizzle, MD  albuterol (PROVENTIL HFA;VENTOLIN HFA) 108 (90 Base) MCG/ACT inhaler Inhale 2 puffs into the lungs every 4 (four) hours as needed for wheezing or shortness of breath (cough, shortness of breath or wheezing.). 08/07/17   Garnetta Buddy, PA  cetirizine (ZYRTEC) 10 MG tablet Take by mouth. 09/19/22   [provider]  Ciclopirox 1 % shampoo Apply 1 each topically at bedtime. 06/18/23   de [place], Raymond J, MD  clobetasol ointment (TEMOVATE) 0.05 % Apply topically 2 (two) times daily. 09/16/23   de [place], Buren Kos, MD  diclofenac (VOLTAREN) 75 MG EC tablet Take 1 tablet (75 mg total) by mouth 2 (two) times  daily with a meal. 05/29/23   Ranelle Oyster, MD  doxazosin (CARDURA) 1 MG tablet Take 1 tablet (1 mg total) by mouth daily. 08/20/23   Hilbert Bible, FNP  fluocinolone 0.01 % cream Apply twice a week. 06/19/19   [provider]  Fluocinolone Acetonide Scalp 0.01 % OIL Apply 1 Application topically 2 (two) times a week. 08/22/23   Caudle, Shelton Silvas, FNP  fluticasone (FLONASE) 50 MCG/ACT nasal spray Administer 1 spray in each nostril 2 times daily. 09/19/22   [provider]  glipiZIDE (GLUCOTROL XL) 10 MG 24 hr tablet Take 1 tablet (10 mg total) by mouth daily. 04/16/23   de [place], Buren Kos, MD  glucose blood (PRECISION QID TEST) test strip Check glucose twice daily as needed. 09/19/22   [provider]  hydrochlorothiazide (MICROZIDE) 12.5 MG capsule Take 1 capsule (12.5 mg total) by mouth daily. 05/24/23   de [place], Buren Kos, MD  ipratropium-albuterol (DUONEB) 0.5-2.5 (3) MG/3ML SOLN Inhale 3 mLs into the lungs every 6 (six) hours as needed. 07/09/23   Alyson Reedy, FNP  irbesartan-hydrochlorothiazide (AVALIDE) 300-12.5 MG tablet Take 1 tablet by mouth daily. 05/24/23   de [place], Raymond J, MD  Naftifine HCl (NAFTIN) 2 % CREA Apply liberally to affected area daily 04/16/23   de [place], Buren Kos, MD  NIFEdipine (PROCARDIA XL/NIFEDICAL XL) 60 MG 24 hr tablet Take 1 tablet (60 mg total) by mouth daily. 05/24/23  de [place], Buren Kos, MD  norethindrone (AYGESTIN) 5 MG tablet Take 3 tablets (15 mg total) by mouth daily. 10/22/18   Anyanwu, Jethro Bastos, MD  nortriptyline (PAMELOR) 10 MG capsule Take 1-2 capsules (10-20 mg total) by mouth at bedtime. 07/17/23   Ranelle Oyster, MD  ondansetron (ZOFRAN-ODT) 4 MG disintegrating tablet Take 4 mg by mouth every 8 (eight) hours as needed. 09/14/23   [provider]  Oxycodone HCl 10 MG TABS Take 1 tablet (10 mg total) by mouth every 8 (eight) hours as needed. 09/06/23   Ranelle Oyster, MD  tirzepatide [facility]) 5  MG/0.5ML Pen Inject 5 mg into the skin once a week. 08/26/23   de [place], Buren Kos, MD  tiZANidine (ZANAFLEX) 4 MG tablet Take 1 tablet (4 mg total) by mouth every 8 (eight) hours as needed for muscle spasms. 08/12/23   de [place], Raymond J, MD  topiramate (TOPAMAX) 50 MG tablet Take 1/2 tablet at bedtime for one week, then increase to 1 tablet at bedtime 07/30/23   Shon Millet R, DO  triamcinolone cream (KENALOG) 0.1 % Apply 1 Application topically 2 (two) times daily. 08/20/23   de [place], Buren Kos, MD  zolpidem (AMBIEN) 5 MG tablet Take 1 tablet (5 mg total) by mouth at bedtime as needed for sleep. 08/12/23   de [place], Raymond J, MD      Allergies    Losartan, Penicillins, and Tramadol    Review of Systems   Review of Systems  Physical Exam Updated Vital Signs BP 136/77   Pulse 93   Temp 98.8 F (37.1 C) (Oral)   Resp 16   Ht 1.651 m (5\' 5" )   Wt (!) 151 kg   SpO2 98%   BMI 55.41 kg/m  Physical Exam Vitals reviewed.  Constitutional:      Appearance: She is obese.  HENT:     Head: Normocephalic.  Eyes:     Extraocular Movements: Extraocular movements intact.  Cardiovascular:     Rate and Rhythm: Normal rate.     Heart sounds: Normal heart sounds.  Pulmonary:     Effort: Pulmonary effort is normal.  Abdominal:     General: Bowel sounds are normal.     Palpations: Abdomen is soft.     Tenderness: There is abdominal tenderness in the right upper quadrant and epigastric area.  Skin:    General: Skin is warm.     Capillary Refill: Capillary refill takes less than 2 seconds.  Neurological:     General: No focal deficit present.     Mental Status: She is alert.  Psychiatric:        Mood and Affect: Mood normal.     ED Results / Procedures / Treatments   Labs (all labs ordered are listed, but only abnormal results are displayed) Labs Reviewed  CBC - Abnormal; Notable for the following components:      Result Value   Platelets 419 (*)    All other components within normal  limits  COMPREHENSIVE METABOLIC PANEL - Abnormal; Notable for the following components:   Glucose, Bld 123 (*)    Calcium 8.8 (*)    AST 13 (*)    Alkaline Phosphatase 37 (*)    All other components within normal limits  URINALYSIS, ROUTINE W REFLEX MICROSCOPIC - Abnormal; Notable for the following components:   APPearance HAZY (*)    Specific Gravity, Urine 1.034 (*)    Ketones, ur TRACE (*)  Protein, ur 30 (*)    Leukocytes,Ua SMALL (*)    Bacteria, UA FEW (*)    All other components within normal limits  URINE CULTURE  LIPASE, BLOOD  PREGNANCY, URINE    EKG None  Radiology CT Renal Stone Study Result Date: 10/01/2023 CLINICAL DATA:  Abdominal and flank pain for 2 weeks. Nausea and vomiting. EXAM: CT ABDOMEN AND PELVIS WITHOUT CONTRAST TECHNIQUE: Multidetector CT imaging of the abdomen and pelvis was performed following the standard protocol without IV contrast. RADIATION DOSE REDUCTION: This exam was performed according to the departmental dose-optimization program which includes automated exposure control, adjustment of the mA and/or kV according to patient size and/or use of iterative reconstruction technique. COMPARISON:  10/19/2014 FINDINGS: Lower chest: No acute findings. Hepatobiliary: No mass visualized on this unenhanced exam. Gallbladder is unremarkable. No evidence of biliary ductal dilatation. Pancreas: No mass or inflammatory process visualized on this unenhanced exam. Spleen:  Within normal limits in size. Adrenals/Urinary tract: No evidence of urolithiasis or hydronephrosis. Unremarkable unopacified urinary bladder. Stomach/Bowel: No evidence of obstruction, inflammatory process, or abnormal fluid collections. Normal appendix visualized. Extensive colonic diverticulosis noted, without signs of diverticulitis. Vascular/Lymphatic: No pathologically enlarged lymph nodes identified. No evidence of abdominal aortic aneurysm. Reproductive:  No mass or other significant  abnormality. Other:  None. Musculoskeletal:  No suspicious bone lesions identified. IMPRESSION: No evidence of urolithiasis, hydronephrosis, or other acute findings. Colonic diverticulosis, without radiographic evidence of diverticulitis. Electronically Signed   By: Danae Orleans M.D.   On: 10/01/2023 10:38   DG Abdomen 1 View Result Date: 10/01/2023 CLINICAL DATA:  Abdominal pain nausea and vomiting EXAM: ABDOMEN - 1 VIEW COMPARISON:  None Available. FINDINGS: The bowel gas pattern is normal. Normal radiographic stool burden. No radio-opaque calculi or other significant radiographic abnormality are seen. IMPRESSION: No acute or significant finding by plain radiography Electronically Signed   By: Judie Petit.  Shick M.D.   On: 10/01/2023 08:36   US Abdomen Limited RUQ (LIVER/GB) Result Date: 10/01/2023 CLINICAL DATA:  151471 RUQ pain 151471, elevated LFTs EXAM: ULTRASOUND ABDOMEN LIMITED RIGHT UPPER QUADRANT COMPARISON:  None Available. FINDINGS: Gallbladder: No gallstones or wall thickening visualized. No sonographic Murphy sign noted by sonographer. Common bile duct: Diameter: 4.3 mm Liver: No focal lesion identified. Within normal limits in parenchymal echogenicity. Portal vein is patent on color Doppler imaging with normal direction of blood flow towards the liver. Other: No free fluid or ascites IMPRESSION: Normal right upper quadrant ultrasound. Electronically Signed   By: Judie Petit.  Shick M.D.   On: 10/01/2023 08:35    Procedures Procedures    Medications Ordered in ED Medications  lactated ringers bolus 500 mL (500 mLs Intravenous New Bag/Given 10/01/23 0839)  fentaNYL (SUBLIMAZE) injection 50 mcg (50 mcg Intravenous Given 10/01/23 0838)  ondansetron (ZOFRAN) injection 4 mg (4 mg Intravenous Given 10/01/23 0835)    ED Course/ Medical Decision Making/ A&P Clinical Course as of 10/01/23 1120  Tue Oct 01, 2023  0828 Pregnancy test reviewed interpreted and negative Urinalysis significant for leukocytes, few  bacteria, 21-50 white blood cells, mucus, hyaline cast, calcium oxalate casts present [DR]  1033 Reviewed interpreted ultrasound abdomen with normal right upper quadrant exam [DR]  1057 CT without evidence of acute abnormality Urinalysis reviewed and patient may have UTI.  She does have squamous epithelial present as well [DR]  1057 .  Will treat with Keflex [DR]    Clinical Course User Index [DR] Margarita Grizzle, MD  Medical Decision Making Amount and/or Complexity of Data Reviewed Labs: ordered. Radiology: ordered.  Risk Prescription drug management.   Right Upper Quadrant   Biliary colic-gb US normal Cholangitis-no abnormal wbc, [place] normal Cholecystitis-as above Fitz-Hugh-Curtis Syndrome-no concern for sti Hepatitis-lft normal Hepatic abscess-none seen on imaging Hepatic congestion Herpes zoster-no rash Mesenteric ischemia-not cw presentation Perforated duodenal ulcer-not idenified on imaging Pneumonia (RLL)-low index of suspicion Pulmonary embolism-low index of suspicion Pyelonephritis/nephrolithiasis -may have uti-will tx with bactrim due to pcn allergy  I have reevaluated patient and discussed the above findings with the patient.  She appears to be stable currently.  Plan to start Protonix and will treat with Bactrim for UTI as patient has allergy to penicillins. We have discussed return precautions and for follow-up she voiced understanding.       Final Clinical Impression(s) / ED Diagnoses Final diagnoses:  Epigastric pain    Rx / DC Orders ED Discharge Orders          Ordered    pantoprazole (PROTONIX) 40 MG tablet  Daily        10/01/23 1119    sulfamethoxazole-trimethoprim (BACTRIM DS) 800-160 MG tablet  2 times daily        10/01/23 1119              Margarita Grizzle, MD 10/01/23 1120

## 2023-10-01 NOTE — ED Triage Notes (Signed)
 C/o ABD pain, n/v, and lower back pain x 2 weeks. Recent hx of constipation.

## 2023-10-01 NOTE — Discharge Instructions (Addendum)
 Please take Protonix as prescribed.  You are given a referral to [facility] gastroenterology.  Please call them today for follow-up as soon as possible.  Take antibiotic as prescribed Return to the emergency department if you are having new, worsening symptoms, or unable to tolerate fluids.

## 2023-10-01 NOTE — ED Notes (Signed)
 Pt transported to radiology and ultrasound before labs drawn

## 2023-10-02 ENCOUNTER — Other Ambulatory Visit (HOSPITAL_BASED_OUTPATIENT_CLINIC_OR_DEPARTMENT_OTHER): Payer: Self-pay

## 2023-10-02 ENCOUNTER — Telehealth: Payer: Self-pay | Admitting: Neurology

## 2023-10-02 LAB — URINE CULTURE: Culture: NO GROWTH

## 2023-10-02 NOTE — Telephone Encounter (Signed)
 NPSG- [employer] pending faxed notes.

## 2023-10-03 ENCOUNTER — Encounter (HOSPITAL_BASED_OUTPATIENT_CLINIC_OR_DEPARTMENT_OTHER): Payer: Self-pay | Admitting: Physical Therapy

## 2023-10-03 ENCOUNTER — Ambulatory Visit (HOSPITAL_BASED_OUTPATIENT_CLINIC_OR_DEPARTMENT_OTHER): Payer: Medicaid Other | Admitting: Physical Therapy

## 2023-10-03 DIAGNOSIS — R2689 Other abnormalities of gait and mobility: Secondary | ICD-10-CM

## 2023-10-03 DIAGNOSIS — M25561 Pain in right knee: Secondary | ICD-10-CM

## 2023-10-03 DIAGNOSIS — M5459 Other low back pain: Secondary | ICD-10-CM

## 2023-10-03 DIAGNOSIS — R29898 Other symptoms and signs involving the musculoskeletal system: Secondary | ICD-10-CM

## 2023-10-03 DIAGNOSIS — M6281 Muscle weakness (generalized): Secondary | ICD-10-CM

## 2023-10-03 NOTE — Therapy (Signed)
 OUTPATIENT PHYSICAL THERAPY THORACOLUMBAR TREATMENT   Patient Name: Molly Archer MRN: [id number] DOB:10-Sep-1980, 43 y.o., female Today's Date: 10/04/2023  END OF SESSION:  PT End of Session - 10/03/23 1537     Visit Number 7    Number of Visits 16    Date for PT Re-Evaluation 10/24/23    Authorization Type [place] MEDICAID [employer]    PT Start Time 1536    PT Stop Time 1618    PT Time Calculation (min) 42 min    Activity Tolerance Patient tolerated treatment well    Behavior During Therapy [facility] for tasks assessed/performed            Past Medical History:  Diagnosis Date   Anemia    Asthma    only uses inhaler in fall and spring   Constipation    GERD (gastroesophageal reflux disease)    diet controlled, occ. uses omeprazole   Headache    otc med prn   History of kidney stones    Hx of Bell's palsy June 2013   Hypertension    currently no meds   Menometrorrhagia    Prediabetes    Psoriasis    Shortness of breath dyspnea    with exercise/exertion   Vitamin D deficiency    Past Surgical History:  Procedure Laterality Date   COMBINED HYSTEROSCOPY DIAGNOSTIC / D&C N/A 2011   Benign secretory endometrium   CYSTOSCOPY WITH STENT PLACEMENT Left 10/19/2014   Procedure: CYSTOSCOPY WITH STENT PLACEMENT;  Surgeon: Crist Fat, MD;  Location: [facility];  Service: Urology;  Laterality: Left;   DILATION AND CURETTAGE OF UTERUS  10/2003   Polyp, mild atypia, simple and complex hyperplasia,   DILATION AND CURETTAGE OF UTERUS  11/2003   Simple and complex hyperplasia   DILATION AND CURETTAGE OF UTERUS  2011   DILATION AND CURETTAGE OF UTERUS N/A 12/14/2014   Procedure: DILATATION AND CURETTAGE;  Surgeon: Catalina Antigua, MD;  Location: [facility];  Service: Gynecology;  Laterality: N/A;   HOLMIUM LASER APPLICATION Left 10/19/2014   Procedure: HOLMIUM LASER APPLICATION;  Surgeon: Crist Fat, MD;  Location: [facility];  Service: Urology;  Laterality: Left;    HYSTEROSCOPY WITH D & C N/A 07/21/2018   Procedure: DILATATION AND CURETTAGE /HYSTEROSCOPY;  Surgeon: Adam Phenix, MD;  Location: [facility];  Service: Gynecology;  Laterality: N/A;   INTRAUTERINE DEVICE (IUD) INSERTION N/A 12/14/2014   Procedure: INTRAUTERINE DEVICE (IUD) INSERTION;  Surgeon: Catalina Antigua, MD;  Location: [facility];  Service: Gynecology;  Laterality: N/A;   STONE EXTRACTION WITH BASKET Left 10/19/2014   Procedure: STONE EXTRACTION WITH BASKET;  Surgeon: Crist Fat, MD;  Location: [facility];  Service: Urology;  Laterality: Left;   WISDOM TOOTH EXTRACTION     Patient Active Problem List   Diagnosis Date Noted   Constipation 09/25/2023   Sinusitis 09/09/2023   Early awakening 08/26/2023   Lumbar radiculopathy 07/17/2023   Acute cough 06/25/2023   Strep throat 06/18/2023   Adjustment disorder with anxious mood 05/16/2023   Primary osteoarthritis of right knee 05/15/2023   Right hip pain 05/15/2023   Spondylosis of lumbar spine 05/15/2023   Cervical spondylolysis 04/16/2023   Cervical sprain 04/16/2023   Encounter for screening for other metabolic disorders 04/16/2023   Lumbar sprain 04/16/2023   Left wrist sprain 04/16/2023   Archer's deformity of left heel 04/16/2023   Derangement of left knee 04/16/2023   Vaginal discharge 04/16/2023   Intracranial hypertension 03/04/2023  Diabetes mellitus (HCC) 03/04/2023   Phase of life problem 03/04/2023   Primary insomnia 09/19/2022   Migraine without aura and without status migrainosus, not intractable 05/02/2020   Complex tear of lateral meniscus of left knee as current injury 12/22/2019   Iron overload 12/21/2019   Medication management contract signed 12/09/2019   Primary osteoarthritis of left knee 11/17/2019   Patellofemoral pain syndrome of left knee 09/08/2019   Dermatitis 12/18/2018   Vitamin D deficiency 09/25/2018   Absolute anemia 09/25/2018   Class 3 severe obesity with serious comorbidity and body mass  index (BMI) of 50.0 to 59.9 in adult [facility]) 09/25/2018   IUD strings lost 09/24/2018   Thickened endometrium 07/20/2018   History of endometrial hyperplasia 07/20/2018   Morbid obesity with BMI of 50.0-59.9, adult (HCC) 07/20/2018   Abnormal uterine bleeding (AUB)    Hypertension 02/25/2012   Hx of Bell's palsy 02/25/2012    PCP: Raymond de Peru MD  REFERRING PROVIDER: Ranelle Oyster, MD  REFERRING DIAG: [phone] (ICD-10-CM) - Spondylosis of lumbar spine M54.16 (ICD-10-CM) - Lumbar radiculopathy Diagnosis: Chronic low back pain and radiculopathy RLE, L4   Rx: Eval and Treat. McKenzie technique, low back and core strengthening and lengthening  Rationale for Evaluation and Treatment: Rehabilitation  THERAPY DIAG:  Other low back pain  Other abnormalities of gait and mobility  Muscle weakness (generalized)  Right knee pain, unspecified chronicity  Other symptoms and signs involving the musculoskeletal system  ONSET DATE: 3 years  SUBJECTIVE:                                                                                                                                                                                           SUBJECTIVE STATEMENT: Pt went to ED on Tuesday, but they didn't find anything wrong except an UTI.  They referred her to GI.  Pt states she is wondering if her sx's are coming from the mounjaro injections.   Pt states she was sore after prior Rx though not as bad as the prior land based treatment.  Pt had a little back pain after prior Rx though nothing significant.  Pt states her L knee is bothering her when she first stands up for the past 2 days.  Pt performed PPU at home this AM and it helped some.  Pt reports she has been performing her HEP.  Pt reports 55% improvement in sx's overall.  Pt states she hasn't been having enough pain to make her cry.    PERTINENT HISTORY:  Increased BMI, chronic pain, HTN, history of Bell's palsy, anemia, headaches,  dyspnea with exertion   PAIN:  Are you having pain? Yes: NPRS scale: 2/10  Pain location: central lower thoracic/upper lumbar ; 0/10 Pain description: ache Aggravating factors: standing, walking, driving Relieving factors: sitting in recliner, ice  PRECAUTIONS: None  WEIGHT BEARING RESTRICTIONS: No  FALLS:  Has patient fallen in last 6 months? 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 Redemonstrated right foraminal disc protrusion at L4-L5 which likely contacts the exiting right L4 nerve root, unchanged. 2. Moderate bilateral facet degenerative change at L3-L4 and L5-S1, unchanged.  MRI 06/06/23 R knee IMPRESSION: 1. Tricompartmental osteoarthritis, with substantial lateral marginal spurring of the patella. 2. Moderate knee effusion. 3. Thickened distal iliotibial band but without surrounding edema to further suggest iliotibial band syndrome.   TODAY'S TREATMENT:                                                                                                                               Reviewed pt presentation, pain levels, response to prior Rx, and HEP compliance.     PPU 2 x 10 reps Supine marching with TrA 2x10 Supine alt UE/LE with TrA x10 Supine clams with TrA with GTB 2x10 Standing UE V's with pink ball with TrA 2x10 Standing rows with RTB with TrA with retraction 2x10 Standing shoulder extension with TrA with retraction 2x10  Pt received a HEP handout and was educated in correct form and appropriate frequency  PATIENT EDUCATION:  Education details:  Exercise form, HEP, POC, and relevant anatomy Person educated: Patient Education method: Programmer, multimedia, Demonstration,  Education comprehension: verbalized understanding,  returned demonstration, verbal cues required, and tactile cues required  HOME EXERCISE PROGRAM: Access Code: [email or web address] URL: [email or web address]  Updated HEP: - Supine March  - 1 x daily - 7 x weekly - 2 sets - 10 reps - Supine Core Control with Leg Extension  - 1 x daily - 7 x weekly - 2 sets - 10 reps   ASSESSMENT:  CLINICAL IMPRESSION: Pt reports 55% improvement in sx's overall.  Pt performed exercises well with cuing and instruction in correct form.  Pt tolerated treatment well.  Pt reports no change in pain with PPU in the clinic though did state that PPU's did make her feel better this AM.  She has met all of her STG's (#1,2) and partially met LTG #1.  Pt responded well to Rx stating she felt better after Rx.  Pt should benefit from cont skilled PT to address impairments and ongoing goals and to assist in restoring desired level of function.      OBJECTIVE IMPAIRMENTS: Abnormal gait, decreased activity tolerance, decreased balance, decreased endurance, decreased mobility, difficulty walking, decreased ROM, decreased strength, increased muscle spasms, impaired flexibility, improper body mechanics, postural dysfunction, obesity, and pain.   ACTIVITY LIMITATIONS: carrying, lifting,  bending, standing, squatting, stairs, transfers, reach over head, hygiene/grooming, locomotion level, and caring for others  PARTICIPATION LIMITATIONS: meal prep, cleaning, laundry, driving, shopping, community activity, occupation, and yard work  PERSONAL FACTORS: Fitness, Time since onset of injury/illness/exacerbation, and 3+ comorbidities: Increased BMI, chronic pain, HTN  are also affecting patient's functional outcome.   REHAB POTENTIAL: Good  CLINICAL DECISION MAKING: Stable/uncomplicated  EVALUATION COMPLEXITY: Low   GOALS: Goals reviewed with patient? Yes  SHORT TERM GOALS: Target date: 09/12/2023    Patient will be independent with HEP in order to improve functional  outcomes. Baseline:  Goal status: GOAL MET  2.  Patient will report at least 25% improvement in symptoms for improved quality of life. Baseline:  Goal status: GOAL MET   LONG TERM GOALS: Target date: 10/10/2023    Patient will report at least 75% improvement in symptoms for improved quality of life. Baseline:  Goal status: 73% MET  2.  Patient will improve LEFS score by at least 18 points in order to indicate improved tolerance to activity. Baseline: 14/80 Goal status: INITIAL  3.  Patient will be able to complete 5x STS in under 14 seconds in order to demonstrate improved functional strength. Baseline: 24. 46 seconds with some R UE support on chair Goal status: INITIAL  4.  Patient will be able to ambulate at least 400 feet in in order to demonstrate improved tolerance to activity. Baseline: 328 feet Goal status: INITIAL  5.  Patient will demonstrate grade of 5/5 MMT or 10lb increase in force in all tested musculature as evidence of improved strength to assist with stair ambulation and gait.   Baseline: see above Goal status: INITIAL     PLAN:  PT FREQUENCY: 1-2x/week  PT DURATION: 8 weeks starting in Feb 2025 due to scheduling  PLANNED INTERVENTIONS: 97164- PT Re-evaluation, 97110-Therapeutic exercises, 97530- Therapeutic activity, 97112- Neuromuscular re-education, 97535- Self Care, 96295- Manual therapy, [phone]- Gait training, [phone]- Orthotic Fit/training, [phone]- Canalith repositioning, [id number]- Aquatic Therapy, [phone]- Splinting, Patient/Family education, Balance training, Stair training, Taping, Dry Needling, Joint mobilization, Joint manipulation, Spinal manipulation, Spinal mobilization, Scar mobilization, and DME instructions.  PLAN FOR NEXT SESSION: core and postural strength, LE strength and progress as tolerated.  Cont with land and aquatic therapy.    Audie Clear III PT, DPT 10/04/23 5:51 PM

## 2023-10-04 ENCOUNTER — Encounter (HOSPITAL_BASED_OUTPATIENT_CLINIC_OR_DEPARTMENT_OTHER): Payer: Self-pay | Admitting: Physical Therapy

## 2023-10-06 ENCOUNTER — Other Ambulatory Visit (HOSPITAL_BASED_OUTPATIENT_CLINIC_OR_DEPARTMENT_OTHER): Payer: Self-pay | Admitting: Family Medicine

## 2023-10-06 ENCOUNTER — Other Ambulatory Visit: Payer: Self-pay | Admitting: Physical Medicine & Rehabilitation

## 2023-10-06 DIAGNOSIS — M1711 Unilateral primary osteoarthritis, right knee: Secondary | ICD-10-CM

## 2023-10-06 DIAGNOSIS — M47816 Spondylosis without myelopathy or radiculopathy, lumbar region: Secondary | ICD-10-CM

## 2023-10-07 ENCOUNTER — Other Ambulatory Visit: Payer: Self-pay

## 2023-10-07 ENCOUNTER — Other Ambulatory Visit (HOSPITAL_BASED_OUTPATIENT_CLINIC_OR_DEPARTMENT_OTHER): Payer: Self-pay

## 2023-10-07 ENCOUNTER — Ambulatory Visit (HOSPITAL_BASED_OUTPATIENT_CLINIC_OR_DEPARTMENT_OTHER): Payer: Managed Care, Other (non HMO) | Admitting: Family Medicine

## 2023-10-07 MED ORDER — OXYCODONE HCL 10 MG PO TABS
10.0000 mg | ORAL_TABLET | Freq: Three times a day (TID) | ORAL | 0 refills | Status: DC | PRN
  Filled 2023-10-07: qty 75, 25d supply, fill #0

## 2023-10-07 MED ORDER — TIZANIDINE HCL 4 MG PO TABS
4.0000 mg | ORAL_TABLET | Freq: Three times a day (TID) | ORAL | 0 refills | Status: DC | PRN
  Filled 2023-10-07: qty 30, 10d supply, fill #0

## 2023-10-07 MED ORDER — ZOLPIDEM TARTRATE 5 MG PO TABS
5.0000 mg | ORAL_TABLET | Freq: Every evening | ORAL | 0 refills | Status: DC | PRN
  Filled 2023-10-07: qty 30, 30d supply, fill #0

## 2023-10-07 NOTE — Telephone Encounter (Signed)
 [employer] denied the NPSG please see below for the reason.   Do you want to order a HST?

## 2023-10-08 ENCOUNTER — Ambulatory Visit (HOSPITAL_BASED_OUTPATIENT_CLINIC_OR_DEPARTMENT_OTHER): Payer: Medicaid Other | Admitting: Physical Therapy

## 2023-10-08 ENCOUNTER — Other Ambulatory Visit (HOSPITAL_BASED_OUTPATIENT_CLINIC_OR_DEPARTMENT_OTHER): Payer: Self-pay

## 2023-10-08 ENCOUNTER — Encounter (HOSPITAL_BASED_OUTPATIENT_CLINIC_OR_DEPARTMENT_OTHER): Payer: Self-pay | Admitting: Physical Therapy

## 2023-10-08 ENCOUNTER — Encounter (HOSPITAL_BASED_OUTPATIENT_CLINIC_OR_DEPARTMENT_OTHER): Payer: Self-pay | Admitting: Family Medicine

## 2023-10-08 DIAGNOSIS — M5459 Other low back pain: Secondary | ICD-10-CM | POA: Diagnosis not present

## 2023-10-08 DIAGNOSIS — M6281 Muscle weakness (generalized): Secondary | ICD-10-CM

## 2023-10-08 DIAGNOSIS — R2689 Other abnormalities of gait and mobility: Secondary | ICD-10-CM

## 2023-10-08 DIAGNOSIS — M25561 Pain in right knee: Secondary | ICD-10-CM

## 2023-10-08 NOTE — Therapy (Signed)
 OUTPATIENT PHYSICAL THERAPY THORACOLUMBAR TREATMENT   Patient Name: Molly Archer MRN: [id number] DOB:1981/06/02, 43 y.o., female Today's Date: 10/08/2023  END OF SESSION:  PT End of Session - 10/08/23 1712     Visit Number 8    Number of Visits 16    Date for PT Re-Evaluation 10/24/23    Authorization Type [place] MEDICAID [employer]    PT Start Time 1707    PT Stop Time 1745    PT Time Calculation (min) 38 min    Behavior During Therapy [facility] for tasks assessed/performed            Past Medical History:  Diagnosis Date   Anemia    Asthma    only uses inhaler in fall and spring   Constipation    GERD (gastroesophageal reflux disease)    diet controlled, occ. uses omeprazole   Headache    otc med prn   History of kidney stones    Hx of Bell's palsy June 2013   Hypertension    currently no meds   Menometrorrhagia    Prediabetes    Psoriasis    Shortness of breath dyspnea    with exercise/exertion   Vitamin D deficiency    Past Surgical History:  Procedure Laterality Date   COMBINED HYSTEROSCOPY DIAGNOSTIC / D&C N/A 2011   Benign secretory endometrium   CYSTOSCOPY WITH STENT PLACEMENT Left 10/19/2014   Procedure: CYSTOSCOPY WITH STENT PLACEMENT;  Surgeon: Crist Fat, MD;  Location: [facility];  Service: Urology;  Laterality: Left;   DILATION AND CURETTAGE OF UTERUS  10/2003   Polyp, mild atypia, simple and complex hyperplasia,   DILATION AND CURETTAGE OF UTERUS  11/2003   Simple and complex hyperplasia   DILATION AND CURETTAGE OF UTERUS  2011   DILATION AND CURETTAGE OF UTERUS N/A 12/14/2014   Procedure: DILATATION AND CURETTAGE;  Surgeon: Catalina Antigua, MD;  Location: [facility];  Service: Gynecology;  Laterality: N/A;   HOLMIUM LASER APPLICATION Left 10/19/2014   Procedure: HOLMIUM LASER APPLICATION;  Surgeon: Crist Fat, MD;  Location: [facility];  Service: Urology;  Laterality: Left;   HYSTEROSCOPY WITH D & C N/A 07/21/2018   Procedure:  DILATATION AND CURETTAGE /HYSTEROSCOPY;  Surgeon: Adam Phenix, MD;  Location: [facility];  Service: Gynecology;  Laterality: N/A;   INTRAUTERINE DEVICE (IUD) INSERTION N/A 12/14/2014   Procedure: INTRAUTERINE DEVICE (IUD) INSERTION;  Surgeon: Catalina Antigua, MD;  Location: [facility];  Service: Gynecology;  Laterality: N/A;   STONE EXTRACTION WITH BASKET Left 10/19/2014   Procedure: STONE EXTRACTION WITH BASKET;  Surgeon: Crist Fat, MD;  Location[facility];  Service: Urology;  Laterality: Left;   WISDOM TOOTH EXTRACTION     Patient Active Problem List   Diagnosis Date Noted   Constipation 09/25/2023   Sinusitis 09/09/2023   Early awakening 08/26/2023   Lumbar radiculopathy 07/17/2023   Acute cough 06/25/2023   Strep throat 06/18/2023   Adjustment disorder with anxious mood 05/16/2023   Primary osteoarthritis of right knee 05/15/2023   Right hip pain 05/15/2023   Spondylosis of lumbar spine 05/15/2023   Cervical spondylolysis 04/16/2023   Cervical sprain 04/16/2023   Encounter for screening for other metabolic disorders 04/16/2023   Lumbar sprain 04/16/2023   Left wrist sprain 04/16/2023   Haglund's deformity of left heel 04/16/2023   Derangement of left knee 04/16/2023   Vaginal discharge 04/16/2023   Intracranial hypertension 03/04/2023   Diabetes mellitus (HCC) 03/04/2023   Phase  of life problem 03/04/2023   Primary insomnia 09/19/2022   Migraine without aura and without status migrainosus, not intractable 05/02/2020   Complex tear of lateral meniscus of left knee as current injury 12/22/2019   Iron overload 12/21/2019   Medication management contract signed 12/09/2019   Primary osteoarthritis of left knee 11/17/2019   Patellofemoral pain syndrome of left knee 09/08/2019   Dermatitis 12/18/2018   Vitamin D deficiency 09/25/2018   Absolute anemia 09/25/2018   Class 3 severe obesity with serious comorbidity and body mass index (BMI) of 50.0 to 59.9 in adult [facility]) 09/25/2018    IUD strings lost 09/24/2018   Thickened endometrium 07/20/2018   History of endometrial hyperplasia 07/20/2018   Morbid obesity with BMI of 50.0-59.9, adult (HCC) 07/20/2018   Abnormal uterine bleeding (AUB)    Hypertension 02/25/2012   Hx of Bell's palsy 02/25/2012    PCP: Raymond de Peru MD  REFERRING PROVIDER: Ranelle Oyster, MD  REFERRING DIAG: [phone] (ICD-10-CM) - Spondylosis of lumbar spine M54.16 (ICD-10-CM) - Lumbar radiculopathy Diagnosis: Chronic low back pain and radiculopathy RLE, L4   Rx: Eval and Treat. McKenzie technique, low back and core strengthening and lengthening  Rationale for Evaluation and Treatment: Rehabilitation  THERAPY DIAG:  Other low back pain  Other abnormalities of gait and mobility  Muscle weakness (generalized)  Right knee pain, unspecified chronicity  ONSET DATE: 3 years  SUBJECTIVE:                                                                                                                                                                                           SUBJECTIVE STATEMENT: Pt reports overall reduction in intensity of pain since starting therapy.  She had some soreness the day following last land session.      PERTINENT HISTORY:  Increased BMI, chronic pain, HTN, history of Bell's palsy, anemia, headaches, dyspnea with exertion   PAIN:  Are you having pain? no: NPRS scale: 0/10 - took pain medicine hours prior  Pain location: Pain description:  Aggravating factors: standing, walking, driving Relieving factors: sitting in recliner, ice  PRECAUTIONS: None  WEIGHT BEARING RESTRICTIONS: No  FALLS:  Has patient fallen in last 6 months? 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 Redemonstrated right foraminal disc protrusion at L4-L5 which likely contacts the exiting right L4 nerve root, unchanged. 2. Moderate bilateral facet degenerative change at L3-L4 and L5-S1, unchanged.  MRI 06/06/23 R knee IMPRESSION: 1. Tricompartmental osteoarthritis, with substantial lateral marginal spurring of the patella. 2. Moderate knee effusion. 3. Thickened distal iliotibial band but without surrounding edema to further suggest iliotibial band syndrome.   TODAY'S TREATMENT:                                                                                                                              [facility] Adult PT Treatment:                                                DATE: 10/08/23 Pt seen for aquatic therapy today.  Treatment took place in water 3.5-4.75 ft in depth at the [facility] pool. Temp of water was 91.  Pt entered/exited the pool via stairs  with bilat rail. * walking forward / backward unsupported, with arm swing * side stepping with arm addct/ abdct without -> with yellow hand floats * marching with reciprocal row motion with yellow hand floats * farmer carry with single yellow hand float at side walking backwards /forwards * L stretch at wall * UE on wall:  Hip abdct/ addct 2 x 10; hamstring curls x 10 each  * TrA set with single rainbow pull down to thighs -> marching forward / backward tapping  same side knee to rainbow hand float * forward walking kicks * light jog-with yellow noodle under arms -forward/ backward  * straddling noodle and holding corner - cycling, hip abdct/ addct, cross country ski      Pt requires the buoyancy and hydrostatic pressure of water for support, and to offload joints by unweighting joint load by at least 50 % in navel deep water and by at least 75-80% in chest to neck deep water.  Viscosity of the water is needed for resistance of strengthening. Water current perturbations provides challenge to standing balance  requiring increased core activation.  PATIENT EDUCATION:  Education details:  Exercise form, Person educated: Patient Education method: Solicitor,  Education comprehension: verbalized understanding, returned demonstration, verbal cues required, and tactile cues required  HOME EXERCISE PROGRAM: Access Code: [email or web address] URL: [email or web address]   ASSESSMENT:  CLINICAL IMPRESSION: Pt tolerated aquatic exercises well today; no increase in symptoms during session. making gradual progress towards remaining goals.  Pt should benefit from cont skilled PT to address impairments and ongoing goals and to assist in restoring desired level of function.      OBJECTIVE IMPAIRMENTS: Abnormal gait, decreased activity tolerance, decreased balance, decreased endurance, decreased mobility, difficulty walking, decreased ROM, decreased strength, increased muscle spasms, impaired flexibility, improper body mechanics, postural dysfunction, obesity, and pain.   ACTIVITY LIMITATIONS: carrying, lifting, bending, standing, squatting, stairs, transfers, reach over head, hygiene/grooming, locomotion level, and caring for others  PARTICIPATION LIMITATIONS: meal prep, cleaning, laundry, driving, shopping, community activity, occupation, and yard work  PERSONAL FACTORS: Fitness, Time since onset of injury/illness/exacerbation, and 3+ comorbidities: Increased BMI, chronic pain, HTN  are also affecting patient's functional outcome.   REHAB POTENTIAL: Good  CLINICAL DECISION MAKING: Stable/uncomplicated  EVALUATION COMPLEXITY: Low   GOALS: Goals reviewed with patient? Yes  SHORT TERM GOALS: Target date: 09/12/2023    Patient will be independent with HEP in order to improve functional outcomes. Baseline:  Goal status: GOAL MET  2.  Patient will report at least 25% improvement in symptoms for improved quality of life. Baseline:  Goal status: GOAL MET   LONG TERM GOALS: Target  date: 10/24/2023    Patient will report at least 75% improvement in symptoms for improved quality of life. Baseline:  Goal status: 73% MET  2.  Patient will improve LEFS score by at least 18 points in order to indicate improved tolerance to activity. Baseline: 14/80 Goal status: INITIAL  3.  Patient will be able to complete 5x STS in under 14 seconds in order to demonstrate improved functional strength. Baseline: 24. 46 seconds with some R UE support on chair Goal status: INITIAL  4.  Patient will be able to ambulate at least 400 feet in in order to demonstrate improved tolerance to activity. Baseline: 328 feet Goal status: INITIAL  5.  Patient will demonstrate grade of 5/5 MMT or 10lb increase in force in all tested musculature as evidence of improved strength to assist with stair ambulation and gait.   Baseline: see above Goal status: INITIAL     PLAN:  PT FREQUENCY: 1-2x/week  PT DURATION: 8 weeks starting in Feb 2025 due to scheduling  PLANNED INTERVENTIONS: 97164- PT Re-evaluation, 97110-Therapeutic exercises, 97530- Therapeutic activity, 97112- Neuromuscular re-education, 97535- Self Care, 69629- Manual therapy, [phone]- Gait training, [phone]- Orthotic Fit/training, [phone]- Canalith repositioning, [id number]- Aquatic Therapy, [phone]- Splinting, Patient/Family education, Balance training, Stair training, Taping, Dry Needling, Joint mobilization, Joint manipulation, Spinal manipulation, Spinal mobilization, Scar mobilization, and DME instructions.  PLAN FOR NEXT SESSION: core and postural strength, LE strength and progress as tolerated.  Cont with land and aquatic therapy.    Mayer Camel, PTA 10/08/23 5:48 PM [facility] [address] [place], [place], [address] Phone: [phone]   Fax:  [phone]

## 2023-10-08 NOTE — Telephone Encounter (Signed)
 Noted, thank you.   HST [employer] blue no auth req

## 2023-10-08 NOTE — Addendum Note (Signed)
 Addended by: Huston Foley on: 10/08/2023 07:59 AM   Modules accepted: Orders

## 2023-10-08 NOTE — Telephone Encounter (Signed)
 HST ordered.

## 2023-10-10 ENCOUNTER — Ambulatory Visit (HOSPITAL_BASED_OUTPATIENT_CLINIC_OR_DEPARTMENT_OTHER): Payer: Medicaid Other | Admitting: Physical Therapy

## 2023-10-10 ENCOUNTER — Encounter (HOSPITAL_BASED_OUTPATIENT_CLINIC_OR_DEPARTMENT_OTHER): Payer: Self-pay | Admitting: Physical Therapy

## 2023-10-10 DIAGNOSIS — R29898 Other symptoms and signs involving the musculoskeletal system: Secondary | ICD-10-CM

## 2023-10-10 DIAGNOSIS — M6281 Muscle weakness (generalized): Secondary | ICD-10-CM

## 2023-10-10 DIAGNOSIS — R2689 Other abnormalities of gait and mobility: Secondary | ICD-10-CM

## 2023-10-10 DIAGNOSIS — M5459 Other low back pain: Secondary | ICD-10-CM | POA: Diagnosis not present

## 2023-10-10 DIAGNOSIS — M25561 Pain in right knee: Secondary | ICD-10-CM

## 2023-10-10 NOTE — Therapy (Signed)
 OUTPATIENT PHYSICAL THERAPY THORACOLUMBAR TREATMENT   Patient Name: Molly Archer MRN: [id number] DOB:[birth year], 43 y.o., female Today's Date: 10/11/2023  END OF SESSION:  PT End of Session - 10/10/23 1621     Visit Number 9    Number of Visits 16    Date for PT Re-Evaluation 10/24/23    Authorization Type [place] MEDICAID [employer]UNITY    PT Start Time 1618    PT Stop Time 1700    PT Time Calculation (min) 42 min    Activity Tolerance Patient tolerated treatment well    Behavior During Therapy [facility] for tasks assessed/performed            Past Medical History:  Diagnosis Date   Anemia    Asthma    only uses inhaler in fall and spring   Constipation    GERD (gastroesophageal reflux disease)    diet controlled, occ. uses omeprazole   Headache    otc med prn   History of kidney stones    Hx of Bell's palsy June 2013   Hypertension    currently no meds   Menometrorrhagia    Prediabetes    Psoriasis    Shortness of breath dyspnea    with exercise/exertion   Vitamin D deficiency    Past Surgical History:  Procedure Laterality Date   COMBINED HYSTEROSCOPY DIAGNOSTIC / D&C N/A 2011   Benign secretory endometrium   CYSTOSCOPY WITH STENT PLACEMENT Left 10/19/2014   Procedure: CYSTOSCOPY WITH STENT PLACEMENT;  Surgeon: Crist Fat, MD;  [facility]: [facility];  Service: Urology;  Laterality: Left;   DILATION AND CURETTAGE OF UTERUS  10/2003   Polyp, mild atypia, simple and complex hyperplasia,   DILATION AND CURETTAGE OF UTERUS  11/2003   Simple and complex hyperplasia   DILATION AND CURETTAGE OF UTERUS  2011   DILATION AND CURETTAGE OF UTERUS N/A 12/14/2014   Procedure: DILATATION AND CURETTAGE;  Surgeon: Catalina Antigua, MD;  Location: [facility];  Service: Gynecology;  Laterality: N/A;   HOLMIUM LASER APPLICATION Left 10/19/2014   Procedure: HOLMIUM LASER APPLICATION;  Surgeon: Crist Fat, MD;  [facility];  Service: Urology;  Laterality: Left;    HYSTEROSCOPY WITH D & C N/A 07/21/2018   Procedure: DILATATION AND CURETTAGE /HYSTEROSCOPY;  Surgeon: Adam Phenix, MD;  [facility];  Service: Gynecology;  Laterality: N/A;   INTRAUTERINE DEVICE (IUD) INSERTION N/A 12/14/2014   Procedure: INTRAUTERINE DEVICE (IUD) INSERTION;  Surgeon: Catalina Antigua, MD;  [facility];  Service: Gynecology;  Laterality: N/A;   STONE EXTRACTION WITH BASKET Left 10/19/2014   Procedure: STONE EXTRACTION WITH BASKET;  Surgeon: Crist Fat, MD;  [facility];  Service: Urology;  Laterality: Left;   WISDOM TOOTH EXTRACTION     Patient Active Problem List   Diagnosis Date Noted   Constipation 09/25/2023   Sinusitis 09/09/2023   Early awakening 08/26/2023   Lumbar radiculopathy 07/17/2023   Acute cough 06/25/2023   Strep throat 06/18/2023   Adjustment disorder with anxious mood 05/16/2023   Primary osteoarthritis of right knee 05/15/2023   Right hip pain 05/15/2023   Spondylosis of lumbar spine 05/15/2023   Cervical spondylolysis 04/16/2023   Cervical sprain 04/16/2023   Encounter for screening for other metabolic disorders 04/16/2023   Lumbar sprain 04/16/2023   Left wrist sprain 04/16/2023   Haglund's deformity of left heel 04/16/2023   Derangement of left knee 04/16/2023   Vaginal discharge 04/16/2023   Intracranial hypertension 03/04/2023  Diabetes mellitus (HCC) 03/04/2023   Phase of life problem 03/04/2023   Primary insomnia 09/19/2022   Migraine without aura and without status migrainosus, not intractable 05/02/2020   Complex tear of lateral meniscus of left knee as current injury 12/22/2019   Iron overload 12/21/2019   Medication management contract signed 12/09/2019   Primary osteoarthritis of left knee 11/17/2019   Patellofemoral pain syndrome of left knee 09/08/2019   Dermatitis 12/18/2018   Vitamin D deficiency 09/25/2018   Absolute anemia 09/25/2018   Class 3 severe obesity with serious comorbidity and body mass  index (BMI) of 50.0 to 59.9 in adult [facility]) 09/25/2018   IUD strings lost 09/24/2018   Thickened endometrium 07/20/2018   History of endometrial hyperplasia 07/20/2018   Morbid obesity with BMI of 50.0-59.9, adult (HCC) 07/20/2018   Abnormal uterine bleeding (AUB)    Hypertension 02/25/2012   Hx of Bell's palsy 02/25/2012    PCP: Raymond de Peru MD  REFERRING PROVIDER: Ranelle Oyster, MD  REFERRING DIAG: [phone] (ICD-10-CM) - Spondylosis of lumbar spine M54.16 (ICD-10-CM) - Lumbar radiculopathy Diagnosis: Chronic low back pain and radiculopathy RLE, L4   Rx: Eval and Treat. McKenzie technique, low back and core strengthening and lengthening  Rationale for Evaluation and Treatment: Rehabilitation  THERAPY DIAG:  Other low back pain  Other abnormalities of gait and mobility  Muscle weakness (generalized)  Right knee pain, unspecified chronicity  Other symptoms and signs involving the musculoskeletal system  ONSET DATE: 3 years  SUBJECTIVE:                                                                                                                                                                                           SUBJECTIVE STATEMENT: Pt states she was sore the following day after prior aquatic Rx.  Pt states she had no issues after prior land based Rx.  Pt states she feels her back pain as work progresses in the afternoon.  Pt states she has stiffness in bilat knees when standing up.  Pt reports the prone press ups relieve her pain a little.  Pt reports compliance with HEP.   PERTINENT HISTORY:  Increased BMI, chronic pain, HTN, history of Bell's palsy, anemia, headaches, dyspnea with exertion   PAIN:  Are you having pain? no: NPRS scale: No pain, just irritating.  Pain location:  lumbar and knees Pain description:  Aggravating factors: standing, walking, driving Relieving factors: sitting in recliner, ice  PRECAUTIONS: None  WEIGHT BEARING RESTRICTIONS:  No  FALLS:  Has patient fallen in last 6 months? 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 Redemonstrated right foraminal disc protrusion at L4-L5 which likely contacts the exiting right L4 nerve root, unchanged. 2. Moderate bilateral facet degenerative change at L3-L4 and L5-S1, unchanged.  MRI 06/06/23 R knee IMPRESSION: 1. Tricompartmental osteoarthritis, with substantial lateral marginal spurring of the patella. 2. Moderate knee effusion. 3. Thickened distal iliotibial band but without surrounding edema to further suggest iliotibial band syndrome.   TODAY'S TREATMENT:                                                                                                                              Reviewed response to prior Rx, pain level, and pt presentation.   Supine alt UE/LE with TrA 2x10 Supine alt LE extension with TrA x 10 and x 10 while holding pink ball Supine clams with TrA with GTB x20 Standing UE V's with pink ball with TrA x12, 2.2# ball x 10 Standing rows with GTB with TrA with retraction 2x10-15 Standing shoulder extension with TrA with retraction 2x10 Standing paloff press with RTB with TrA 2x10 each LAQ 2# 2x10 bilat Standing shoulder extension with TrA with RTB 2x10  Updated HEP and gave pt a HEP handout.  PT educated pt in correct form and appropriate frequency.    PATIENT EDUCATION:  Education details:  Exercise form, POC, rationale of exercises, relevant anatomy Person educated: Patient Education method: Explanation, Demonstration, verbal and tactile cues Education comprehension: verbalized understanding, returned demonstration, verbal cues required, and tactile cues required  HOME EXERCISE PROGRAM: Access Code:  [email or web address] URL: [email or web address] Updated HEP: - Standing Shoulder Row with Anchored Resistance  - 1 x daily - 5 x weekly - 2 sets - 10 reps - Shoulder extension with resistance - Neutral  - 1 x daily - 4-5 x weekly - 2 sets - 10 reps   ASSESSMENT:  CLINICAL IMPRESSION: Pt is noticing stiffness in R knee again though also notices it in her L knee as well.  Pt is progressing well with core and postural strength as evidenced by performance of and tolerance with exercises.  She performed exercises well with cuing and instruction in correct form.  PT updated HEP and gave pt a HEP handout.  T demonstrates good understanding of HEP.  Pt responded well to Rx stating she had no increased pain, just stiffness in bilat knees.  She should benefit from cont skilled PT including aquatic and land based PT to address impairments and ongoing goals and to assist in restoring desired level of function.       OBJECTIVE IMPAIRMENTS: Abnormal gait, decreased activity tolerance, decreased balance, decreased endurance, decreased mobility, difficulty walking, decreased ROM, decreased strength, increased muscle spasms, impaired flexibility, improper body mechanics, postural dysfunction, obesity, and pain.   ACTIVITY LIMITATIONS: carrying, lifting, bending, standing, squatting, stairs, transfers, reach over head, hygiene/grooming,  locomotion level, and caring for others  PARTICIPATION LIMITATIONS: meal prep, cleaning, laundry, driving, shopping, community activity, occupation, and yard work  PERSONAL FACTORS: Fitness, Time since onset of injury/illness/exacerbation, and 3+ comorbidities: Increased BMI, chronic pain, HTN  are also affecting patient's functional outcome.   REHAB POTENTIAL: Good  CLINICAL DECISION MAKING: Stable/uncomplicated  EVALUATION COMPLEXITY: Low   GOALS: Goals reviewed with patient? Yes  SHORT TERM GOALS: Target date: 09/12/2023    Patient will be independent with HEP  in order to improve functional outcomes. Baseline:  Goal status: GOAL MET  2.  Patient will report at least 25% improvement in symptoms for improved quality of life. Baseline:  Goal status: GOAL MET   LONG TERM GOALS: Target date: 10/24/2023    Patient will report at least 75% improvement in symptoms for improved quality of life. Baseline:  Goal status: 73% MET  2.  Patient will improve LEFS score by at least 18 points in order to indicate improved tolerance to activity. Baseline: 14/80 Goal status: INITIAL  3.  Patient will be able to complete 5x STS in under 14 seconds in order to demonstrate improved functional strength. Baseline: 24. 46 seconds with some R UE support on chair Goal status: INITIAL  4.  Patient will be able to ambulate at least 400 feet in in order to demonstrate improved tolerance to activity. Baseline: 328 feet Goal status: INITIAL  5.  Patient will demonstrate grade of 5/5 MMT or 10lb increase in force in all tested musculature as evidence of improved strength to assist with stair ambulation and gait.   Baseline: see above Goal status: INITIAL     PLAN:  PT FREQUENCY: 1-2x/week  PT DURATION: 8 weeks starting in Feb 2025 due to scheduling  PLANNED INTERVENTIONS: 97164- PT Re-evaluation, 97110-Therapeutic exercises, 97530- Therapeutic activity, 97112- Neuromuscular re-education, 97535- Self Care, 14782- Manual therapy, [phone]- Gait training, [phone]- Orthotic Fit/training, [phone]- Canalith repositioning, [id number]- Aquatic Therapy, [phone]- Splinting, Patient/Family education, Balance training, Stair training, Taping, Dry Needling, Joint mobilization, Joint manipulation, Spinal manipulation, Spinal mobilization, Scar mobilization, and DME instructions.  PLAN FOR NEXT SESSION: core and postural strength, LE strength and progress as tolerated.  Cont with land and aquatic therapy.    Audie Clear III PT, DPT 10/11/23 6:02 PM

## 2023-10-11 ENCOUNTER — Encounter (HOSPITAL_BASED_OUTPATIENT_CLINIC_OR_DEPARTMENT_OTHER): Payer: Self-pay | Admitting: Physical Therapy

## 2023-10-14 ENCOUNTER — Encounter (HOSPITAL_BASED_OUTPATIENT_CLINIC_OR_DEPARTMENT_OTHER): Payer: Self-pay | Admitting: Family Medicine

## 2023-10-14 ENCOUNTER — Ambulatory Visit (HOSPITAL_BASED_OUTPATIENT_CLINIC_OR_DEPARTMENT_OTHER): Admitting: Family Medicine

## 2023-10-14 ENCOUNTER — Other Ambulatory Visit (HOSPITAL_BASED_OUTPATIENT_CLINIC_OR_DEPARTMENT_OTHER): Payer: Self-pay

## 2023-10-14 VITALS — BP 138/72 | HR 100 | Ht 65.0 in | Wt 334.7 lb

## 2023-10-14 DIAGNOSIS — N939 Abnormal uterine and vaginal bleeding, unspecified: Secondary | ICD-10-CM | POA: Diagnosis not present

## 2023-10-14 DIAGNOSIS — E1165 Type 2 diabetes mellitus with hyperglycemia: Secondary | ICD-10-CM

## 2023-10-14 DIAGNOSIS — I10 Essential (primary) hypertension: Secondary | ICD-10-CM

## 2023-10-14 MED ORDER — ALBUTEROL SULFATE HFA 108 (90 BASE) MCG/ACT IN AERS
2.0000 | INHALATION_SPRAY | RESPIRATORY_TRACT | 1 refills | Status: AC | PRN
  Filled 2024-05-07: qty 6.7, 17d supply, fill #0
  Filled 2024-07-10: qty 6.7, 17d supply, fill #1

## 2023-10-14 MED ORDER — ALBUTEROL SULFATE HFA 108 (90 BASE) MCG/ACT IN AERS
2.0000 | INHALATION_SPRAY | RESPIRATORY_TRACT | 1 refills | Status: DC | PRN
  Filled 2023-10-14: qty 18, 17d supply, fill #0

## 2023-10-14 NOTE — Assessment & Plan Note (Signed)
 Patient did have notable GI symptoms felt to be related to [facility].  She has stopped medication and has noted some improvement in GI symptoms since stopping medication about 3 weeks ago.  She does continue with glipizide and metformin at this time.  Recent hemoglobin A1c has been above goal.  No current issues with polyuria or polydipsia. We discussed options, for now patient would prefer to continue with current medication regimen to allow for further improvement in GI symptoms.  She does have evaluation with gastroenterologist in about 1 month.  We did discuss options including alternative injectable medications including Ozempic and Trulicity.  Patient does have some interest, however would prefer to lower GI symptoms to further subside before trying another injectable medication.  This is reasonable. Will plan for follow-up in about 6 weeks, schedule appointment after office visit with GI Plan to check A1c at that time and possibly start new GLP-1 receptor agonist

## 2023-10-14 NOTE — Progress Notes (Signed)
    Procedures performed today:    None.  Independent interpretation of notes and tests performed by another provider:   None.  Brief History, Exam, Impression, and Recommendations:    BP 138/72 (BP Location: Right Arm, Patient Position: Sitting)   Pulse 100   Ht 5\' 5"  (1.651 m)   Wt (!) 334 lb 11.2 oz (151.8 kg)   SpO2 98%   BMI 55.70 kg/m   Type 2 diabetes mellitus with hyperglycemia, without long-term current use of insulin (HCC) Assessment & Plan: Patient did have notable GI symptoms felt to be related to [facility].  She has stopped medication and has noted some improvement in GI symptoms since stopping medication about 3 weeks ago.  She does continue with glipizide and metformin at this time.  Recent hemoglobin A1c has been above goal.  No current issues with polyuria or polydipsia. We discussed options, for now patient would prefer to continue with current medication regimen to allow for further improvement in GI symptoms.  She does have evaluation with gastroenterologist in about 1 month.  We did discuss options including alternative injectable medications including Ozempic and Trulicity.  Patient does have some interest, however would prefer to lower GI symptoms to further subside before trying another injectable medication.  This is reasonable. Will plan for follow-up in about 6 weeks, schedule appointment after office visit with GI Plan to check A1c at that time and possibly start new GLP-1 receptor agonist   Abnormal uterine bleeding (AUB) -     Ambulatory referral to Obstetrics / Gynecology  Primary hypertension Assessment & Plan: Blood pressure elevated in office today, did improve on recheck.  Can continue with current regimen, continue with scheduled follow-up with cardiology No changes to medications today Recommend intermittent monitoring of blood pressure at home, DASH diet   Other orders -     Albuterol Sulfate HFA; Inhale 2 puffs into the lungs every 4 (four)  hours as needed for wheezing or shortness of breath (cough, shortness of breath or wheezing.).  Dispense: 18 g; Refill: 1  Return in about 6 weeks (around 11/25/2023) for diabetes.   ___________________________________________ Molly Archer de [place], MD, ABFM, CAQSM Primary Care and Sports Medicine [facility]

## 2023-10-14 NOTE — Assessment & Plan Note (Signed)
 Blood pressure elevated in office today, did improve on recheck.  Can continue with current regimen, continue with scheduled follow-up with cardiology No changes to medications today Recommend intermittent monitoring of blood pressure at home, DASH diet

## 2023-10-14 NOTE — Patient Instructions (Signed)
  Medication Instructions:  Your physician recommends that you continue on your current medications as directed. Please refer to the Current Medication list given to you today. --If you need a refill on any your medications before your next appointment, please call your pharmacy first. If no refills are authorized on file call the office.--   Follow-Up: Your next appointment:   Your physician recommends that you schedule a follow-up appointment in: 6 weeks follow up  with Dr. de [place]  You will receive a text message or e-mail with a link to a survey about your care and experience with [place] today! We would greatly appreciate your feedback!   Thanks for letting us be apart of your health journey!!  Primary Care and Sports Medicine   Dr. Ceasar Mons [place]   We encourage you to activate your patient portal called "MyChart".  Sign up information is provided on this After Visit Summary.  MyChart is used to connect with patients for Virtual Visits (Telemedicine).  Patients are able to view lab/test results, encounter notes, upcoming appointments, etc.  Non-urgent messages can be sent to your provider as well. To learn more about what you can do with MyChart, please visit --  [email or web address].

## 2023-10-15 ENCOUNTER — Encounter (HOSPITAL_BASED_OUTPATIENT_CLINIC_OR_DEPARTMENT_OTHER): Payer: Self-pay | Admitting: Physical Therapy

## 2023-10-15 ENCOUNTER — Ambulatory Visit (HOSPITAL_BASED_OUTPATIENT_CLINIC_OR_DEPARTMENT_OTHER): Payer: Medicaid Other | Admitting: Physical Therapy

## 2023-10-15 DIAGNOSIS — M25561 Pain in right knee: Secondary | ICD-10-CM

## 2023-10-15 DIAGNOSIS — M5459 Other low back pain: Secondary | ICD-10-CM

## 2023-10-15 DIAGNOSIS — M6281 Muscle weakness (generalized): Secondary | ICD-10-CM

## 2023-10-15 DIAGNOSIS — R2689 Other abnormalities of gait and mobility: Secondary | ICD-10-CM

## 2023-10-15 NOTE — Therapy (Signed)
 OUTPATIENT PHYSICAL THERAPY THORACOLUMBAR TREATMENT   Patient Name: Molly Archer MRN: [id number] DOB:07-11-1981, 43 y.o., female Today's Date: 10/15/2023  END OF SESSION:  PT End of Session - 10/15/23 1712     Visit Number 10    Number of Visits 16    Date for PT Re-Evaluation 10/24/23    Authorization Type [place] MEDICAID [employer]    PT Start Time 1655    PT Stop Time 1735    PT Time Calculation (min) 40 min    Behavior During Therapy [facility] for tasks assessed/performed            Past Medical History:  Diagnosis Date   Anemia    Asthma    only uses inhaler in fall and spring   Constipation    GERD (gastroesophageal reflux disease)    diet controlled, occ. uses omeprazole   Headache    otc med prn   History of kidney stones    Hx of Bell's palsy June 2013   Hypertension    currently no meds   Menometrorrhagia    Prediabetes    Psoriasis    Shortness of breath dyspnea    with exercise/exertion   Vitamin D deficiency    Past Surgical History:  Procedure Laterality Date   COMBINED HYSTEROSCOPY DIAGNOSTIC / D&C N/A 2011   Benign secretory endometrium   CYSTOSCOPY WITH STENT PLACEMENT Left 10/19/2014   Procedure: CYSTOSCOPY WITH STENT PLACEMENT;  Surgeon: Crist Fat, MD;  Location: [facility];  Service: Urology;  Laterality: Left;   DILATION AND CURETTAGE OF UTERUS  10/2003   Polyp, mild atypia, simple and complex hyperplasia,   DILATION AND CURETTAGE OF UTERUS  11/2003   Simple and complex hyperplasia   DILATION AND CURETTAGE OF UTERUS  2011   DILATION AND CURETTAGE OF UTERUS N/A 12/14/2014   Procedure: DILATATION AND CURETTAGE;  Surgeon: Catalina Antigua, MD;  Location: [facility];  Service: Gynecology;  Laterality: N/A;   HOLMIUM LASER APPLICATION Left 10/19/2014   Procedure: HOLMIUM LASER APPLICATION;  Surgeon: Crist Fat, MD;  Location: [facility];  Service: Urology;  Laterality: Left;   HYSTEROSCOPY WITH D & C N/A 07/21/2018   Procedure:  DILATATION AND CURETTAGE /HYSTEROSCOPY;  Surgeon: Adam Phenix, MD;  Location: [facility];  Service: Gynecology;  Laterality: N/A;   INTRAUTERINE DEVICE (IUD) INSERTION N/A 12/14/2014   Procedure: INTRAUTERINE DEVICE (IUD) INSERTION;  Surgeon: Catalina Antigua, MD;  Location: [facility];  Service: Gynecology;  Laterality: N/A;   STONE EXTRACTION WITH BASKET Left 10/19/2014   Procedure: STONE EXTRACTION WITH BASKET;  Surgeon: Crist Fat, MD;  Location: [facility];  Service: Urology;  Laterality: Left;   WISDOM TOOTH EXTRACTION     Patient Active Problem List   Diagnosis Date Noted   Constipation 09/25/2023   Sinusitis 09/09/2023   Early awakening 08/26/2023   Lumbar radiculopathy 07/17/2023   Acute cough 06/25/2023   Strep throat 06/18/2023   Adjustment disorder with anxious mood 05/16/2023   Primary osteoarthritis of right knee 05/15/2023   Right hip pain 05/15/2023   Spondylosis of lumbar spine 05/15/2023   Cervical spondylolysis 04/16/2023   Cervical sprain 04/16/2023   Encounter for screening for other metabolic disorders 04/16/2023   Lumbar sprain 04/16/2023   Left wrist sprain 04/16/2023   Haglund's deformity of left heel 04/16/2023   Derangement of left knee 04/16/2023   Vaginal discharge 04/16/2023   Intracranial hypertension 03/04/2023   Diabetes mellitus (HCC) 03/04/2023   Phase  of life problem 03/04/2023   Primary insomnia 09/19/2022   Migraine without aura and without status migrainosus, not intractable 05/02/2020   Complex tear of lateral meniscus of left knee as current injury 12/22/2019   Iron overload 12/21/2019   Medication management contract signed 12/09/2019   Primary osteoarthritis of left knee 11/17/2019   Patellofemoral pain syndrome of left knee 09/08/2019   Dermatitis 12/18/2018   Vitamin D deficiency 09/25/2018   Absolute anemia 09/25/2018   Class 3 severe obesity with serious comorbidity and body mass index (BMI) of 50.0 to 59.9 in adult [facility] 09/25/2018    IUD strings lost 09/24/2018   Thickened endometrium 07/20/2018   History of endometrial hyperplasia 07/20/2018   Morbid obesity with BMI of 50.0-59.9, adult (HCC) 07/20/2018   Abnormal uterine bleeding (AUB)    Hypertension 02/25/2012   Hx of Bell's palsy 02/25/2012    PCP: Raymond de Peru MD  REFERRING PROVIDER: Ranelle Oyster, MD  REFERRING DIAG: 7811365789 (ICD-10-CM) - Spondylosis of lumbar spine M54.16 (ICD-10-CM) - Lumbar radiculopathy Diagnosis: Chronic low back pain and radiculopathy RLE, L4   Rx: Eval and Treat. McKenzie technique, low back and core strengthening and lengthening  Rationale for Evaluation and Treatment: Rehabilitation  THERAPY DIAG:  Other low back pain  Other abnormalities of gait and mobility  Muscle weakness (generalized)  Right knee pain, unspecified chronicity  ONSET DATE: 3 years  SUBJECTIVE:                                                                                                                                                                                           SUBJECTIVE STATEMENT: Pt reports that the jogging in water last time bothered her knees. "Felt ok that night, but the next morning I could feel it".  Pt reports she has had a migraine since Sunday (3 days) . Pt currently does not have access to pool, but may consider joining [facility].   PERTINENT HISTORY:  Increased BMI, chronic pain, HTN, history of Bell's palsy, anemia, headaches, dyspnea with exertion   PAIN:  Are you having pain? yes: NPRS scale: 8/10 head, 5/10 knees, 4/10 back  Pain location: see above Pain description: sharp/ ache Aggravating factors: standing, walking, driving Relieving factors: sitting in recliner, ice  PRECAUTIONS: None  WEIGHT BEARING RESTRICTIONS: No  FALLS:  Has patient fallen in last 6 months? 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 Redemonstrated right  foraminal disc protrusion at L4-L5 which likely contacts the exiting right L4 nerve root, unchanged. 2. Moderate bilateral facet degenerative change at L3-L4 and L5-S1, unchanged.  MRI 06/06/23 R knee IMPRESSION: 1. Tricompartmental osteoarthritis, with substantial lateral marginal spurring of the patella. 2. Moderate knee effusion. 3. Thickened distal iliotibial band but without surrounding edema to further suggest iliotibial band syndrome.  PATIENT SURVEYS:  LEFS 14/80   =18%  10/15/23 =33/80, 41%   SCREENING FOR RED FLAGS: Bowel or bladder incontinence: No Spinal tumors: No Cauda equina syndrome: No Compression fracture: No Abdominal aneurysm: No   COGNITION: Overall cognitive status: Within functional limits for tasks assessed                          SENSATION: WFL     POSTURE: rounded shoulders and forward head   PALPATION: TTP R glutes/piriformis, R knee at joint line   LUMBAR ROM:    AROM eval  Flexion 0% limited   Extension 25% limited  Right lateral flexion 25% limited  Left lateral flexion 25% limited  Right rotation    Left rotation     (Blank rows = not tested) * = pain/symptoms   LOWER EXTREMITY ROM:   WFL for tasks assessed   Active  Right eval Left eval  Hip flexion      Hip extension      Hip abduction      Hip adduction      Hip internal rotation      Hip external rotation      Knee flexion      Knee extension      Ankle dorsiflexion      Ankle plantarflexion      Ankle inversion      Ankle eversion       (Blank rows = not tested) * = pain/symptoms   LOWER EXTREMITY MMT:     MMT Right eval Left eval  Hip flexion 42.8 47.9  Hip extension      Hip abduction 56.4 65.1  Hip adduction      Hip internal rotation      Hip external rotation      Knee flexion  4- (seated) 4+ (seated)  Knee extension 46.2 76.6  Ankle dorsiflexion      Ankle plantarflexion      Ankle inversion      Ankle eversion       (Blank rows = not tested) * = pain/symptoms       FUNCTIONAL TESTS:  5 times sit to stand: 24. 46 seconds with some R UE support on chair 2 minute walk test: 328 feet    TODAY'S TREATMENT:                                                                                                                              [facility] Adult PT Treatment:  DATE: 10/15/23 Pt seen for aquatic therapy today.  Treatment took place in water 3.5-4.75 ft in depth at the [facility] pool. Temp of water was 91.  Pt entered/exited the pool via stairs  with bilat rail. * 2 min walk test = 371ft  * LEFS= 33/80  * walking forward / backward unsupported, with arm swing * side stepping with arm addct/ abdct  * straddling noodle and holding corner - cycling, hip abdct/ addct, cross country ski * farmer carry with single yellow hand float at side walking backwards /forwards * wide stance with arm horiz abdct/ addct with rainbow hand floats * L stretch at wall * side step with arm addct/ abdct with rainbow hand floats * walking forward/ backward with reciprocal row motion * back against wall:  3 way LE stretch with hollow noodle at ankle, (ITB, hamstring, adductor)       PATIENT EDUCATION:  Education details:  Exercise form, POC, rationale of exercises, relevant anatomy Person educated: Patient Education method: Explanation, Demonstration, verbal and tactile cues Education comprehension: verbalized understanding, returned demonstration, verbal cues required, and tactile cues required  HOME EXERCISE PROGRAM: Access Code: [email or web address] URL: [email or web address] Updated HEP: - Standing Shoulder Row with Anchored Resistance  - 1 x daily - 5 x weekly - 2 sets - 10 reps - Shoulder extension with resistance -  Neutral  - 1 x daily - 4-5 x weekly - 2 sets - 10 reps   ASSESSMENT:  CLINICAL IMPRESSION: Pt's LEFS score improved to 33/80.  2 min walk test was less than at eval.  Pt reported minimal change in pain while exercising in water.  She is making gradual progress towards remaining goals.  She should benefit from cont skilled PT including aquatic and land based PT to address impairments and ongoing goals and to assist in restoring desired level of function.  Therapist to check MMT and 5x STS next session.  POC ending soon - therapist to discuss d/c vs recert.       OBJECTIVE IMPAIRMENTS: Abnormal gait, decreased activity tolerance, decreased balance, decreased endurance, decreased mobility, difficulty walking, decreased ROM, decreased strength, increased muscle spasms, impaired flexibility, improper body mechanics, postural dysfunction, obesity, and pain.   ACTIVITY LIMITATIONS: carrying, lifting, bending, standing, squatting, stairs, transfers, reach over head, hygiene/grooming, locomotion level, and caring for others  PARTICIPATION LIMITATIONS: meal prep, cleaning, laundry, driving, shopping, community activity, occupation, and yard work  PERSONAL FACTORS: Fitness, Time since onset of injury/illness/exacerbation, and 3+ comorbidities: Increased BMI, chronic pain, HTN  are also affecting patient's functional outcome.   REHAB POTENTIAL: Good  CLINICAL DECISION MAKING: Stable/uncomplicated  EVALUATION COMPLEXITY: Low   GOALS: Goals reviewed with patient? Yes  SHORT TERM GOALS: Target date: 09/12/2023    Patient will be independent with HEP in order to improve functional outcomes. Baseline:  Goal status: GOAL MET  2.  Patient will report at least 25% improvement in symptoms for improved quality of life. Baseline:  Goal status: GOAL MET   LONG TERM GOALS: Target date: 10/24/2023    Patient will report at least 75% improvement in symptoms for improved quality of life. Baseline:   Goal status: 73% MET  2.  Patient will improve LEFS score by at least 18 points in order to indicate improved tolerance to activity. Baseline: 14/80, see above Goal status: MET - 10/15/23  3.  Patient will be able to complete 5x STS in under 14 seconds in order to demonstrate improved functional strength. Baseline: 24. 46 seconds with  some R UE support on chair Goal status: INITIAL  4.  Patient will be able to ambulate at least 400 feet in in order to demonstrate improved tolerance to activity. Baseline: 328 feet at eval;  319 ft on 10/15/23 Goal status: In progress   5.  Patient will demonstrate grade of 5/5 MMT or 10lb increase in force in all tested musculature as evidence of improved strength to assist with stair ambulation and gait.   Baseline: see above Goal status: INITIAL     PLAN:  PT FREQUENCY: 1-2x/week  PT DURATION: 8 weeks starting in Feb 2025 due to scheduling  PLANNED INTERVENTIONS: 97164- PT Re-evaluation, 97110-Therapeutic exercises, 97530- Therapeutic activity, 97112- Neuromuscular re-education, 97535- Self Care, 19147- Manual therapy, [phone]- Gait training, [phone]- Orthotic Fit/training, [phone]- Canalith repositioning, [id number]- Aquatic Therapy, [phone]- Splinting, Patient/Family education, Balance training, Stair training, Taping, Dry Needling, Joint mobilization, Joint manipulation, Spinal manipulation, Spinal mobilization, Scar mobilization, and DME instructions.  PLAN FOR NEXT SESSION: core and postural strength, LE strength and progress as tolerated.  Cont with land and aquatic therapy.     Mayer Camel, PTA 10/15/23 6:01 PM [facility] [address] [place], [place], [address] Phone: [phone]   Fax:  [phone]

## 2023-10-16 ENCOUNTER — Encounter (HOSPITAL_BASED_OUTPATIENT_CLINIC_OR_DEPARTMENT_OTHER): Payer: Self-pay | Admitting: Physical Therapy

## 2023-10-17 ENCOUNTER — Encounter (HOSPITAL_BASED_OUTPATIENT_CLINIC_OR_DEPARTMENT_OTHER): Payer: Self-pay | Admitting: Physical Therapy

## 2023-10-17 ENCOUNTER — Ambulatory Visit (HOSPITAL_BASED_OUTPATIENT_CLINIC_OR_DEPARTMENT_OTHER): Payer: Medicaid Other | Admitting: Physical Therapy

## 2023-10-17 ENCOUNTER — Other Ambulatory Visit (HOSPITAL_BASED_OUTPATIENT_CLINIC_OR_DEPARTMENT_OTHER): Payer: Self-pay

## 2023-10-17 DIAGNOSIS — M5459 Other low back pain: Secondary | ICD-10-CM

## 2023-10-17 DIAGNOSIS — M25561 Pain in right knee: Secondary | ICD-10-CM

## 2023-10-17 DIAGNOSIS — R2689 Other abnormalities of gait and mobility: Secondary | ICD-10-CM

## 2023-10-17 DIAGNOSIS — M6281 Muscle weakness (generalized): Secondary | ICD-10-CM

## 2023-10-17 DIAGNOSIS — R29898 Other symptoms and signs involving the musculoskeletal system: Secondary | ICD-10-CM

## 2023-10-17 NOTE — Therapy (Signed)
 OUTPATIENT PHYSICAL THERAPY THORACOLUMBAR TREATMENT/ PROGRESS NOTE  Progress Note Reporting Period 08/15/23 to 10/17/23  See note below for Objective Data and Assessment of Progress/Goals.       Patient Name: Molly Archer MRN: [id number] DOB:July 01, 1981, 43 y.o., female Today's Date: 10/18/2023  END OF SESSION:  PT End of Session - 10/17/23 1625     Visit Number 11    Number of Visits 20    Date for PT Re-Evaluation 11/21/23    PT Start Time 1620    PT Stop Time 1702    PT Time Calculation (min) 42 min    Activity Tolerance Patient tolerated treatment well    Behavior During Therapy [facility] for tasks assessed/performed            Past Medical History:  Diagnosis Date   Anemia    Asthma    only uses inhaler in fall and spring   Constipation    GERD (gastroesophageal reflux disease)    diet controlled, occ. uses omeprazole   Headache    otc med prn   History of kidney stones    Hx of Bell's palsy June 2013   Hypertension    currently no meds   Menometrorrhagia    Prediabetes    Psoriasis    Shortness of breath dyspnea    with exercise/exertion   Vitamin D deficiency    Past Surgical History:  Procedure Laterality Date   COMBINED HYSTEROSCOPY DIAGNOSTIC / D&C N/A 2011   Benign secretory endometrium   CYSTOSCOPY WITH STENT PLACEMENT Left 10/19/2014   Procedure: CYSTOSCOPY WITH STENT PLACEMENT;  Surgeon: Crist Fat, MD;  Location: [facility];  Service: Urology;  Laterality: Left;   DILATION AND CURETTAGE OF UTERUS  10/2003   Polyp, mild atypia, simple and complex hyperplasia,   DILATION AND CURETTAGE OF UTERUS  11/2003   Simple and complex hyperplasia   DILATION AND CURETTAGE OF UTERUS  2011   DILATION AND CURETTAGE OF UTERUS N/A 12/14/2014   Procedure: DILATATION AND CURETTAGE;  Surgeon: Catalina Antigua, MD;  Location: [facility];  Service: Gynecology;  Laterality: N/A;   HOLMIUM LASER APPLICATION Left 10/19/2014   Procedure: HOLMIUM LASER APPLICATION;  Surgeon:  Crist Fat, MD;  Location: [facility];  Service: Urology;  Laterality: Left;   HYSTEROSCOPY WITH D & C N/A 07/21/2018   Procedure: DILATATION AND CURETTAGE /HYSTEROSCOPY;  Surgeon: Adam Phenix, MD;  Location: [facility];  Service: Gynecology;  Laterality: N/A;   INTRAUTERINE DEVICE (IUD) INSERTION N/A 12/14/2014   Procedure: INTRAUTERINE DEVICE (IUD) INSERTION;  Surgeon: Catalina Antigua, MD;  Location: [facility];  Service: Gynecology;  Laterality: N/A;   STONE EXTRACTION WITH BASKET Left 10/19/2014   Procedure: STONE EXTRACTION WITH BASKET;  Surgeon: Crist Fat, MD;  Location: [facility];  Service: Urology;  Laterality: Left;   WISDOM TOOTH EXTRACTION     Patient Active Problem List   Diagnosis Date Noted   Constipation 09/25/2023   Sinusitis 09/09/2023   Early awakening 08/26/2023   Lumbar radiculopathy 07/17/2023   Acute cough 06/25/2023   Strep throat 06/18/2023   Adjustment disorder with anxious mood 05/16/2023   Primary osteoarthritis of right knee 05/15/2023   Right hip pain 05/15/2023   Spondylosis of lumbar spine 05/15/2023   Cervical spondylolysis 04/16/2023   Cervical sprain 04/16/2023   Encounter for screening for other metabolic disorders 04/16/2023   Lumbar sprain 04/16/2023   Left wrist sprain 04/16/2023   Haglund's deformity of left heel 04/16/2023  Derangement of left knee 04/16/2023   Vaginal discharge 04/16/2023   Intracranial hypertension 03/04/2023   Diabetes mellitus (HCC) 03/04/2023   Phase of life problem 03/04/2023   Primary insomnia 09/19/2022   Migraine without aura and without status migrainosus, not intractable 05/02/2020   Complex tear of lateral meniscus of left knee as current injury 12/22/2019   Iron overload 12/21/2019   Medication management contract signed 12/09/2019   Primary osteoarthritis of left knee 11/17/2019   Patellofemoral pain syndrome of left knee 09/08/2019   Dermatitis 12/18/2018   Vitamin D deficiency 09/25/2018   Absolute  anemia 09/25/2018   Class 3 severe obesity with serious comorbidity and body mass index (BMI) of 50.0 to 59.9 in adult [facility]) 09/25/2018   IUD strings lost 09/24/2018   Thickened endometrium 07/20/2018   History of endometrial hyperplasia 07/20/2018   Morbid obesity with BMI of 50.0-59.9, adult (HCC) 07/20/2018   Abnormal uterine bleeding (AUB)    Hypertension 02/25/2012   Hx of Bell's palsy 02/25/2012    PCP: Raymond de Peru MD  REFERRING PROVIDER: Ranelle Oyster, MD  REFERRING DIAG: [phone] (ICD-10-CM) - Spondylosis of lumbar spine M54.16 (ICD-10-CM) - Lumbar radiculopathy Diagnosis: Chronic low back pain and radiculopathy RLE, L4   Rx: Eval and Treat. McKenzie technique, low back and core strengthening and lengthening  Rationale for Evaluation and Treatment: Rehabilitation  THERAPY DIAG:  Other low back pain  Other abnormalities of gait and mobility  Muscle weakness (generalized)  Right knee pain, unspecified chronicity  Other symptoms and signs involving the musculoskeletal system  ONSET DATE: 3 years  SUBJECTIVE:                                                                                                                                                                                           SUBJECTIVE STATEMENT: Pt reports 50% improvement in pain and sx's overall.  Pt states she is able to walk more and walk faster.  Pt is able to sleep more in her bed and not as much in recliner.  Pt has been able to get in and out of bathtub easier.    Pt states her knees started bothering her last week.  She has been on her feet more at work.  Pt is limited with standing duration.  Pt has difficulty with ascending and descending stairs.  She descend stairs sideways.  Pt has difficulty getting with car and truck transfers.    Pt reports she has been performing her HEP though hasn't done as much of the newer exercises.    PERTINENT HISTORY:  Increased BMI, chronic pain, HTN,  history of  Bell's palsy, anemia, headaches, dyspnea with exertion   PAIN:  Are you having pain? yes: NPRS scale: 8/10 head, 5/10 knees, 4/10 back  Pain location: see above Pain description: sharp/ ache Aggravating factors: standing, walking, driving Relieving factors: sitting in recliner, ice  PRECAUTIONS: None  WEIGHT BEARING RESTRICTIONS: No  FALLS:  Has patient fallen in last 6 months? 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 Redemonstrated right foraminal disc protrusion at L4-L5 which likely contacts the exiting right L4 nerve root, unchanged. 2. Moderate bilateral facet degenerative change at L3-L4 and L5-S1, unchanged.  MRI 06/06/23 R knee IMPRESSION: 1. Tricompartmental osteoarthritis, with substantial lateral marginal spurring of the patella. 2. Moderate knee effusion. 3. Thickened distal iliotibial band but without surrounding edema to further suggest iliotibial band syndrome.  PATIENT SURVEYS:  LEFS 14/80   =18%  10/15/23 =33/80, 41%   SCREENING FOR RED FLAGS: Bowel or bladder incontinence: No Spinal tumors: No Cauda equina syndrome: No Compression fracture: No Abdominal aneurysm: No   COGNITION: Overall cognitive status: Within functional limits for tasks assessed                          SENSATION: WFL     POSTURE: rounded shoulders and forward head   PALPATION: TTP R glutes/piriformis, R knee at joint line   LUMBAR ROM:    AROM eval  Flexion 0% limited   Extension 25% limited  Right lateral flexion 25% limited  Left lateral flexion 25% limited  Right rotation    Left rotation     (Blank rows = not tested) * = pain/symptoms   LOWER EXTREMITY ROM:   WFL for tasks assessed   Active  Right eval  Left eval  Hip flexion      Hip extension      Hip abduction      Hip adduction      Hip internal rotation      Hip external rotation      Knee flexion      Knee extension      Ankle dorsiflexion      Ankle plantarflexion      Ankle inversion      Ankle eversion       (Blank rows = not tested) * = pain/symptoms   LOWER EXTREMITY MMT:     MMT Right eval Left eval Right 3/20 Left 3/20  Hip flexion 42.8 47.9 41.8 42.6  Hip extension        Hip abduction 56.4 65.1    Hip adduction        Hip internal rotation        Hip external rotation        Knee flexion 4- (seated) 4+ (seated) 5/5 seated 5/5 seated  Knee extension 46.2 76.6 41.4   Ankle dorsiflexion        Ankle plantarflexion        Ankle inversion        Ankle eversion         (Blank rows = not tested) * = pain/symptoms       FUNCTIONAL TESTS:  5 times sit to stand:  Initial/Current:  24. 46  seconds with some R UE support on chair /  21.85 sec without UE  support 2 minute walk test: 328 feet    TODAY'S TREATMENT:    Reviewed reported functional improvements, functional deficits, and HEP compliance. Assessed strength and 5x STS test.  See above                                                                                                                             Supine alt UE/LE with TrA 2x10 Supine alt LE extension with TrA x 10 and x 10 while holding pink ball Standing rows with GTB with TrA with retraction x10 Standing shoulder extension with RTB with TrA with retraction 2x10 Standing paloff press with RTB with TrA 2x10 each    PATIENT EDUCATION:  Education details:  Exercise form, POC, rationale of exercises, relevant anatomy Person educated: Patient Education method: Explanation, Demonstration, verbal and tactile cues Education comprehension: verbalized understanding, returned demonstration, verbal cues required, and tactile cues required  HOME EXERCISE PROGRAM: Access Code: [email or web address] URL:  [email or web address] Updated HEP: - Standing Shoulder Row with Anchored Resistance  - 1 x daily - 5 x weekly - 2 sets - 10 reps - Shoulder extension with resistance - Neutral  - 1 x daily - 4-5 x weekly - 2 sets - 10 reps   ASSESSMENT:  CLINICAL IMPRESSION: Pt is making good progress in PT.  Pt reports 50% improvement in pain and sx's overall.  Pt reports improved ambulation, bathtub transfers, and sleeping.  Her knees have started bothering her.  She is limited with standing duration and has difficulty with ascending and descending stairs.  Pt also has difficultly with car/truck transfers.  She is improving with core strength as evidenced by performance and progression of core exercises.  She has improved tolerance with core exercises on land.  Pt demonstrates a minimal improvement with 5x STS test in reference to time (24.46  seconds to 21.85 seconds) though was able to perform without any UE support.  Pt's LE strength is worse with HHD testing today though she improved in bilat HS strength with MMT.  LEFS was completed last visit and she demonstrated improvement indicating improved self perceived disability.  Pt has met all her STG's and LTG #2 and partially met LTG's #1,5.  She should benefit from cont skilled PT to address impairments and ongoing goals and to assist with restoring desired level of function.      OBJECTIVE IMPAIRMENTS: Abnormal gait, decreased activity tolerance, decreased balance, decreased endurance, decreased mobility, difficulty walking, decreased ROM, decreased strength, increased muscle spasms, impaired flexibility, improper body mechanics, postural dysfunction, obesity, and pain.   ACTIVITY LIMITATIONS: carrying, lifting, bending, standing, squatting, stairs, transfers, reach over head, hygiene/grooming, locomotion level, and caring for others  PARTICIPATION LIMITATIONS: meal prep, cleaning, laundry, driving, shopping, community activity, occupation, and  yard work  PERSONAL FACTORS: Fitness, Time since onset of injury/illness/exacerbation, and 3+ comorbidities: Increased BMI, chronic pain, HTN  are also affecting patient's functional outcome.   REHAB POTENTIAL: Good  CLINICAL DECISION MAKING: Stable/uncomplicated  EVALUATION COMPLEXITY: Low   GOALS: Goals reviewed with patient? Yes  SHORT TERM GOALS: Target date: 09/12/2023    Patient will be independent with HEP in order to improve functional outcomes. Baseline:  Goal status: GOAL MET  2.  Patient will report at least 25% improvement in symptoms for improved quality of life. Baseline:  Goal status: GOAL MET   LONG TERM GOALS: Target date: 11/21/2023    Patient will report at least 75% improvement in symptoms for improved quality of life. Baseline:  Goal status: 73% MET  2.  Patient will improve LEFS score by at least 18 points in order to indicate improved tolerance to activity. Baseline: 14/80, see above Goal status: MET - 10/15/23  3.  Patient will be able to complete 5x STS in under 14 seconds in order to demonstrate improved functional strength. Baseline: 24. 46 seconds with some R UE support on chair Goal status: PROGRESSING  3/20  4.  Patient will be able to ambulate at least 400 feet in in order to demonstrate improved tolerance to activity. Baseline: 328 feet at eval;  319 ft on 10/15/23 Goal status: In progress   5.  Patient will demonstrate grade of 5/5 MMT or 10lb increase in force in all tested musculature as evidence of improved strength to assist with stair ambulation and gait.   Baseline: see above Goal status: partially met  3/20     PLAN:  PT FREQUENCY: 2x/wk  PT DURATION: 5 weeks  PLANNED INTERVENTIONS: 97164- PT Re-evaluation, 97110-Therapeutic exercises, 97530- Therapeutic activity, 97112- Neuromuscular re-education, 97535- Self Care, 08657- Manual therapy, [phone]- Gait training, [phone]- Orthotic Fit/training, [phone]- Canalith  repositioning, [id number]- Aquatic Therapy, [phone]- Splinting, Patient/Family education, Balance training, Stair training, Taping, Dry Needling, Joint mobilization, Joint manipulation, Spinal manipulation, Spinal mobilization, Scar mobilization, and DME instructions.  PLAN FOR NEXT SESSION: core and postural strength, LE strength and progress as tolerated.  Cont with land and aquatic therapy.    For all possible CPT codes, reference the Planned Interventions line above.                         Check all conditions that are expected to impact treatment: {Conditions expected to impact treatment:Morbid obesity and Respiratory disorders               If treatment provided at initial evaluation, no treatment charged due to lack of authorization.     Audie Clear III PT, DPT 10/18/23 12:47 PM

## 2023-10-22 ENCOUNTER — Encounter (HOSPITAL_BASED_OUTPATIENT_CLINIC_OR_DEPARTMENT_OTHER): Payer: Self-pay | Admitting: Physical Therapy

## 2023-10-22 ENCOUNTER — Ambulatory Visit (HOSPITAL_BASED_OUTPATIENT_CLINIC_OR_DEPARTMENT_OTHER): Payer: Medicaid Other | Admitting: Physical Therapy

## 2023-10-22 DIAGNOSIS — R29898 Other symptoms and signs involving the musculoskeletal system: Secondary | ICD-10-CM

## 2023-10-22 DIAGNOSIS — M5459 Other low back pain: Secondary | ICD-10-CM

## 2023-10-22 DIAGNOSIS — R2689 Other abnormalities of gait and mobility: Secondary | ICD-10-CM

## 2023-10-22 DIAGNOSIS — M6281 Muscle weakness (generalized): Secondary | ICD-10-CM

## 2023-10-22 DIAGNOSIS — M25561 Pain in right knee: Secondary | ICD-10-CM

## 2023-10-22 NOTE — Therapy (Signed)
 OUTPATIENT PHYSICAL THERAPY THORACOLUMBAR TREATMENT/ PROGRESS NOTE  Patient Name: Molly Archer MRN: [id number] DOB:08/07/1980, 43 y.o., female Today's Date: 10/22/2023  END OF SESSION:  PT End of Session - 10/22/23 1706     Visit Number 12    Number of Visits 20    Date for PT Re-Evaluation 11/21/23    Authorization Type [place] [employer]ICA[employer]    PT Start Time 1700    PT Stop Time 1739    PT Time Calculation (min) 39 min    Behavior During Therapy [facility] for tasks assessed/performed            Past Medical History:  Diagnosis Date   Anemia    Asthma    only uses inhaler in fall and spring   Constipation    GERD (gastroesophageal reflux disease)    diet controlled, occ. uses omeprazole   Headache    otc med prn   History of kidney stones    Hx of Bell's palsy June 2013   Hypertension    currently no meds   Menometrorrhagia    Prediabetes    Psoriasis    Shortness of breath dyspnea    with exercise/exertion   Vitamin D deficiency    Past Surgical History:  Procedure Laterality Date   COMBINED HYSTEROSCOPY DIAGNOSTIC / D&C N/A 2011   Benign secretory endometrium   CYSTOSCOPY WITH STENT PLACEMENT Left 10/19/2014   Procedure: CYSTOSCOPY WITH STENT PLACEMENT;  Surgeon: Crist Fat, MD;  Location: [facility];  Service: Urology;  Laterality: Left;   DILATION AND CURETTAGE OF UTERUS  10/2003   Polyp, mild atypia, simple and complex hyperplasia,   DILATION AND CURETTAGE OF UTERUS  11/2003   Simple and complex hyperplasia   DILATION AND CURETTAGE OF UTERUS  2011   DILATION AND CURETTAGE OF UTERUS N/A 12/14/2014   Procedure: DILATATION AND CURETTAGE;  Surgeon: Catalina Antigua, MD;  Location: [facility];  Service: Gynecology;  Laterality: N/A;   HOLMIUM LASER APPLICATION Left 10/19/2014   Procedure: HOLMIUM LASER APPLICATION;  Surgeon: Crist Fat, MD;  Location: [facility];  Service: Urology;  Laterality: Left;   HYSTEROSCOPY WITH D & C N/A 07/21/2018    Procedure: DILATATION AND CURETTAGE /HYSTEROSCOPY;  Surgeon: Adam Phenix, MD;  Location: [facility];  Service: Gynecology;  Laterality: N/A;   INTRAUTERINE DEVICE (IUD) INSERTION N/A 12/14/2014   Procedure: INTRAUTERINE DEVICE (IUD) INSERTION;  Surgeon: Catalina Antigua, MD;  Location: [facility];  Service: Gynecology;  Laterality: N/A;   STONE EXTRACTION WITH BASKET Left 10/19/2014   Procedure: STONE EXTRACTION WITH BASKET;  Surgeon: Crist Fat, MD;  Location: [facility];  Service: Urology;  Laterality: Left;   WISDOM TOOTH EXTRACTION     Patient Active Problem List   Diagnosis Date Noted   Constipation 09/25/2023   Sinusitis 09/09/2023   Early awakening 08/26/2023   Lumbar radiculopathy 07/17/2023   Acute cough 06/25/2023   Strep throat 06/18/2023   Adjustment disorder with anxious mood 05/16/2023   Primary osteoarthritis of right knee 05/15/2023   Right hip pain 05/15/2023   Spondylosis of lumbar spine 05/15/2023   Cervical spondylolysis 04/16/2023   Cervical sprain 04/16/2023   Encounter for screening for other metabolic disorders 04/16/2023   Lumbar sprain 04/16/2023   Left wrist sprain 04/16/2023   Haglund's deformity of left heel 04/16/2023   Derangement of left knee 04/16/2023   Vaginal discharge 04/16/2023   Intracranial hypertension 03/04/2023   Diabetes mellitus (HCC) 03/04/2023  Phase of life problem 03/04/2023   Primary insomnia 09/19/2022   Migraine without aura and without status migrainosus, not intractable 05/02/2020   Complex tear of lateral meniscus of left knee as current injury 12/22/2019   Iron overload 12/21/2019   Medication management contract signed 12/09/2019   Primary osteoarthritis of left knee 11/17/2019   Patellofemoral pain syndrome of left knee 09/08/2019   Dermatitis 12/18/2018   Vitamin D deficiency 09/25/2018   Absolute anemia 09/25/2018   Class 3 severe obesity with serious comorbidity and body mass index (BMI) of 50.0 to 59.9 in adult [facility])  09/25/2018   IUD strings lost 09/24/2018   Thickened endometrium 07/20/2018   History of endometrial hyperplasia 07/20/2018   Morbid obesity with BMI of 50.0-59.9, adult (HCC) 07/20/2018   Abnormal uterine bleeding (AUB)    Hypertension 02/25/2012   Hx of Bell's palsy 02/25/2012    PCP: Raymond de Peru MD  REFERRING PROVIDER: Ranelle Oyster, MD  REFERRING DIAG: [phone] (ICD-10-CM) - Spondylosis of lumbar spine M54.16 (ICD-10-CM) - Lumbar radiculopathy Diagnosis: Chronic low back pain and radiculopathy RLE, L4   Rx: Eval and Treat. McKenzie technique, low back and core strengthening and lengthening  Rationale for Evaluation and Treatment: Rehabilitation  THERAPY DIAG:  Other low back pain  Other abnormalities of gait and mobility  Muscle weakness (generalized)  Right knee pain, unspecified chronicity  Other symptoms and signs involving the musculoskeletal system  ONSET DATE: 3 years  SUBJECTIVE:                                                                                                                                                                                           SUBJECTIVE STATEMENT: Pt reports continued pain in bilat knees and back.  Pt reports that she has headache today.     PERTINENT HISTORY:  Increased BMI, chronic pain, HTN, history of Bell's palsy, anemia, headaches, dyspnea with exertion   PAIN:  Are you having pain? yes: NPRS scale: 6/10 Head,  knees, back, and L foot  Pain location: see above Pain description: sharp/ ache Aggravating factors: standing, walking, driving Relieving factors: sitting in recliner, ice  PRECAUTIONS: None  WEIGHT BEARING RESTRICTIONS: No  FALLS:  Has patient fallen in last 6 months? 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 Redemonstrated right foraminal disc protrusion at L4-L5 which likely contacts the exiting right L4 nerve root, unchanged. 2. Moderate bilateral facet  degenerative change at L3-L4 and L5-S1, unchanged.  MRI 06/06/23 R knee IMPRESSION: 1. Tricompartmental osteoarthritis, with substantial lateral marginal spurring of the patella. 2. Moderate knee effusion. 3. Thickened distal iliotibial band but without surrounding edema to further suggest iliotibial band syndrome.  PATIENT SURVEYS:  LEFS 14/80   =18%  10/15/23 =33/80, 41%   SCREENING FOR RED FLAGS: Bowel or bladder incontinence: No Spinal tumors: No Cauda equina syndrome: No Compression fracture: No Abdominal aneurysm: No   COGNITION: Overall cognitive status: Within functional limits for tasks assessed                          SENSATION: WFL     POSTURE: rounded shoulders and forward head   PALPATION: TTP R glutes/piriformis, R knee at joint line   LUMBAR ROM:    AROM eval  Flexion 0% limited   Extension 25% limited  Right lateral flexion 25% limited  Left lateral flexion 25% limited  Right rotation    Left rotation     (Blank rows = not tested) * = pain/symptoms   LOWER EXTREMITY ROM:   WFL for tasks assessed   Active  Right eval Left eval  Hip flexion      Hip extension      Hip abduction      Hip adduction      Hip internal rotation      Hip external rotation      Knee flexion      Knee extension      Ankle dorsiflexion      Ankle plantarflexion      Ankle inversion      Ankle eversion       (Blank rows = not tested) * = pain/symptoms   LOWER EXTREMITY MMT:     MMT Right eval Left eval Right 3/20 Left 3/20  Hip flexion 42.8 47.9 41.8 42.6  Hip extension        Hip abduction 56.4 65.1    Hip adduction        Hip internal rotation        Hip external rotation        Knee flexion 4- (seated) 4+ (seated) 5/5 seated 5/5  seated  Knee extension 46.2 76.6 41.4   Ankle dorsiflexion        Ankle plantarflexion        Ankle inversion        Ankle eversion         (Blank rows = not tested) * = pain/symptoms       FUNCTIONAL TESTS:  5 times sit to stand:  Initial/Current:  24. 46  seconds with some R UE support on chair /  21.85 sec without UE support 2 minute walk test: 328 feet    TODAY'S TREATMENT:   [facility] Adult PT Treatment:                                                DATE: 10/22/23 Pt seen for aquatic therapy today.  Treatment took place in water 3.5-4.75 ft in depth at the [facility] pool. Temp of water was 91.  Pt entered/exited the pool via stairs  with bilat rail.   * walking forward / backward unsupported, with arm swing * side stepping with arm addct/ abdct  * marching forward /  backward painful * wide stance with arm horiz abdct/ addct and reciprocal arm swing with light resistance bells * seated in lift chair: cycling, hip abdct/ addct, flutter kick, LAQ (painful) * side step with arm addct/ abdct with light resistance bells (painful in L ankle) * L stretch at wall * wall push up/push off x 10 * TrA set with half hollow noodle pull down to thighs x 10 * back against wall:  3 way LE stretch with hollow noodle at ankle, (ITB, hamstring, adductor)       PATIENT EDUCATION:  Education details:  Exercise form, POC, rationale of exercises, relevant anatomy Person educated: Patient Education method: Explanation, Demonstration, verbal and tactile cues Education comprehension: verbalized understanding, returned demonstration, verbal cues required, and tactile cues required  HOME EXERCISE PROGRAM: Access Code: [email or web address] URL: [email or web address] Updated HEP: - Standing Shoulder Row with Anchored Resistance  - 1 x daily - 5 x weekly - 2 sets - 10 reps - Shoulder extension with resistance - Neutral  - 1 x daily - 4-5 x weekly - 2 sets - 10 reps   ASSESSMENT:  CLINICAL  IMPRESSION: Pt with elevated pain sensitivity today.  Limited tolerance for seated LAQ, marching, side stepping with resistance due to increased L ankle and R knee pain.  Pt reports minor temporary relief while exercising in the water. Pt is making gradual progress towards remaining goals.   She should benefit from cont skilled PT to address impairments and ongoing goals and to assist with restoring desired level of function.      OBJECTIVE IMPAIRMENTS: Abnormal gait, decreased activity tolerance, decreased balance, decreased endurance, decreased mobility, difficulty walking, decreased ROM, decreased strength, increased muscle spasms, impaired flexibility, improper body mechanics, postural dysfunction, obesity, and pain.   ACTIVITY LIMITATIONS: carrying, lifting, bending, standing, squatting, stairs, transfers, reach over head, hygiene/grooming, locomotion level, and caring for others  PARTICIPATION LIMITATIONS: meal prep, cleaning, laundry, driving, shopping, community activity, occupation, and yard work  PERSONAL FACTORS: Fitness, Time since onset of injury/illness/exacerbation, and 3+ comorbidities: Increased BMI, chronic pain, HTN  are also affecting patient's functional outcome.   REHAB POTENTIAL: Good  CLINICAL DECISION MAKING: Stable/uncomplicated  EVALUATION COMPLEXITY: Low   GOALS: Goals reviewed with patient? Yes  SHORT TERM GOALS: Target date: 09/12/2023    Patient will be independent with HEP in order to improve functional outcomes. Baseline:  Goal status: GOAL MET  2.  Patient will report at least 25% improvement in symptoms for improved quality of life. Baseline:  Goal status: GOAL MET   LONG TERM GOALS: Target date: 11/21/2023    Patient will report at least 75% improvement in symptoms for improved quality of life. Baseline:  Goal status: 73% MET  2.  Patient will improve LEFS score by at least 18 points in order to indicate improved tolerance to  activity. Baseline: 14/80, see above Goal status: MET - 10/15/23  3.  Patient will be able to complete 5x STS in under 14 seconds in order to demonstrate improved functional strength. Baseline: 24. 46 seconds with some R UE support on chair Goal status: PROGRESSING  3/20  4.  Patient will be able to ambulate at least 400 feet in in order to demonstrate improved tolerance to activity. Baseline: 328 feet at eval;  319 ft on 10/15/23 Goal status: In progress   5.  Patient will demonstrate grade of 5/5 MMT or 10lb increase in force in all tested musculature as evidence of improved strength to assist with stair ambulation  and gait.   Baseline: see above Goal status: partially met  3/20     PLAN:  PT FREQUENCY: 2x/wk  PT DURATION: 5 weeks  PLANNED INTERVENTIONS: 97164- PT Re-evaluation, 97110-Therapeutic exercises, 97530- Therapeutic activity, 97112- Neuromuscular re-education, 97535- Self Care, 16109- Manual therapy, [phone]- Gait training, [phone]- Orthotic Fit/training, [phone]- Canalith repositioning, [id number]- Aquatic Therapy, [phone]- Splinting, Patient/Family education, Balance training, Stair training, Taping, Dry Needling, Joint mobilization, Joint manipulation, Spinal manipulation, Spinal mobilization, Scar mobilization, and DME instructions.  PLAN FOR NEXT SESSION: core and postural strength, LE strength and progress as tolerated.  Cont with land and aquatic therapy.     Mayer Camel, PTA 10/22/23 5:47 PM [facility] [address] [place], [place], [address] Phone: [phone]   Fax:  [phone]  For all possible CPT codes, reference the Planned Interventions line above.                         Check all conditions that are expected to impact treatment: {Conditions expected to impact treatment:Morbid obesity and Respiratory disorders               If treatment provided at initial evaluation, no treatment charged due  to lack of authorization.

## 2023-10-24 ENCOUNTER — Encounter (HOSPITAL_BASED_OUTPATIENT_CLINIC_OR_DEPARTMENT_OTHER): Payer: Self-pay

## 2023-10-24 ENCOUNTER — Encounter (HOSPITAL_BASED_OUTPATIENT_CLINIC_OR_DEPARTMENT_OTHER): Payer: Medicaid Other | Admitting: Physical Therapy

## 2023-10-30 ENCOUNTER — Encounter: Payer: Self-pay | Admitting: Physical Medicine & Rehabilitation

## 2023-11-03 ENCOUNTER — Encounter (HOSPITAL_BASED_OUTPATIENT_CLINIC_OR_DEPARTMENT_OTHER): Payer: Self-pay | Admitting: Family Medicine

## 2023-11-04 ENCOUNTER — Telehealth (HOSPITAL_BASED_OUTPATIENT_CLINIC_OR_DEPARTMENT_OTHER): Payer: Self-pay | Admitting: *Deleted

## 2023-11-04 ENCOUNTER — Ambulatory Visit: Admitting: Licensed Clinical Social Worker

## 2023-11-04 NOTE — Telephone Encounter (Signed)
 Copied from CRM [id number]. Topic: General - Other >> Nov 04, 2023  1:14 PM Almira Coaster wrote: Reason for CRM: Dr.Swartz was calling to speak with Dr.De Peru regarding this mutual patient, attempted to call [facility] but no answer. Dr.Swartz best call back number is [phone].

## 2023-11-05 ENCOUNTER — Encounter (HOSPITAL_BASED_OUTPATIENT_CLINIC_OR_DEPARTMENT_OTHER): Payer: Self-pay | Admitting: Physical Therapy

## 2023-11-05 ENCOUNTER — Ambulatory Visit (HOSPITAL_BASED_OUTPATIENT_CLINIC_OR_DEPARTMENT_OTHER): Attending: Physical Medicine & Rehabilitation | Admitting: Physical Therapy

## 2023-11-05 DIAGNOSIS — M6281 Muscle weakness (generalized): Secondary | ICD-10-CM | POA: Diagnosis present

## 2023-11-05 DIAGNOSIS — R29898 Other symptoms and signs involving the musculoskeletal system: Secondary | ICD-10-CM

## 2023-11-05 DIAGNOSIS — M25551 Pain in right hip: Secondary | ICD-10-CM

## 2023-11-05 DIAGNOSIS — R2689 Other abnormalities of gait and mobility: Secondary | ICD-10-CM

## 2023-11-05 DIAGNOSIS — M25561 Pain in right knee: Secondary | ICD-10-CM

## 2023-11-05 DIAGNOSIS — M5459 Other low back pain: Secondary | ICD-10-CM | POA: Diagnosis present

## 2023-11-05 NOTE — Therapy (Signed)
 OUTPATIENT PHYSICAL THERAPY THORACOLUMBAR TREATMENT  Patient Name: Molly Archer MRN: [id number] DOB:07/01/1981, 43 y.o., female Today's Date: 11/05/2023  END OF SESSION:  PT End of Session - 11/05/23 1516     Visit Number 13    Number of Visits 20    Date for PT Re-Evaluation 11/21/23    Authorization Type [place] MEDICAID [employer]    Authorization Time Period 6 visits approved from 10/18/2023 - 12/16/2023    PT Start Time 1516    PT Stop Time 1600    PT Time Calculation (min) 44 min    Behavior During Therapy [facility] for tasks assessed/performed             Past Medical History:  Diagnosis Date   Anemia    Asthma    only uses inhaler in fall and spring   Constipation    GERD (gastroesophageal reflux disease)    diet controlled, occ. uses omeprazole   Headache    otc med prn   History of kidney stones    Hx of Bell's palsy June 2013   Hypertension    currently no meds   Menometrorrhagia    Prediabetes    Psoriasis    Shortness of breath dyspnea    with exercise/exertion   Vitamin D deficiency    Past Surgical History:  Procedure Laterality Date   COMBINED HYSTEROSCOPY DIAGNOSTIC / D&C N/A 2011   Benign secretory endometrium   CYSTOSCOPY WITH STENT PLACEMENT Left 10/19/2014   Procedure: CYSTOSCOPY WITH STENT PLACEMENT;  Surgeon: Crist Fat, MD;  Location: [facility];  Service: Urology;  Laterality: Left;   DILATION AND CURETTAGE OF UTERUS  10/2003   Polyp, mild atypia, simple and complex hyperplasia,   DILATION AND CURETTAGE OF UTERUS  11/2003   Simple and complex hyperplasia   DILATION AND CURETTAGE OF UTERUS  2011   DILATION AND CURETTAGE OF UTERUS N/A 12/14/2014   Procedure: DILATATION AND CURETTAGE;  Surgeon: Catalina Antigua, MD;  Location: [facility];  Service: Gynecology;  Laterality: N/A;   HOLMIUM LASER APPLICATION Left 10/19/2014   Procedure: HOLMIUM LASER APPLICATION;  Surgeon: Crist Fat, MD;  Location: [facility];  Service: Urology;   Laterality: Left;   HYSTEROSCOPY WITH D & C N/A 07/21/2018   Procedure: DILATATION AND CURETTAGE /HYSTEROSCOPY;  Surgeon: Adam Phenix, MD;  Location: [facility];  Service: Gynecology;  Laterality: N/A;   INTRAUTERINE DEVICE (IUD) INSERTION N/A 12/14/2014   Procedure: INTRAUTERINE DEVICE (IUD) INSERTION;  Surgeon: Catalina Antigua, MD;  Location: [facility];  Service: Gynecology;  Laterality: N/A;   STONE EXTRACTION WITH BASKET Left 10/19/2014   Procedure: STONE EXTRACTION WITH BASKET;  Surgeon: Crist Fat, MD;  Location: [facility];  Service: Urology;  Laterality: Left;   WISDOM TOOTH EXTRACTION     Patient Active Problem List   Diagnosis Date Noted   Constipation 09/25/2023   Sinusitis 09/09/2023   Early awakening 08/26/2023   Lumbar radiculopathy 07/17/2023   Acute cough 06/25/2023   Strep throat 06/18/2023   Adjustment disorder with anxious mood 05/16/2023   Primary osteoarthritis of right knee 05/15/2023   Right hip pain 05/15/2023   Spondylosis of lumbar spine 05/15/2023   Cervical spondylolysis 04/16/2023   Cervical sprain 04/16/2023   Encounter for screening for other metabolic disorders 04/16/2023   Lumbar sprain 04/16/2023   Left wrist sprain 04/16/2023   Haglund's deformity of left heel 04/16/2023   Derangement of left knee 04/16/2023   Vaginal discharge 04/16/2023  Intracranial hypertension 03/04/2023   Diabetes mellitus (HCC) 03/04/2023   Phase of life problem 03/04/2023   Primary insomnia 09/19/2022   Migraine without aura and without status migrainosus, not intractable 05/02/2020   Complex tear of lateral meniscus of left knee as current injury 12/22/2019   Iron overload 12/21/2019   Medication management contract signed 12/09/2019   Primary osteoarthritis of left knee 11/17/2019   Patellofemoral pain syndrome of left knee 09/08/2019   Dermatitis 12/18/2018   Vitamin D deficiency 09/25/2018   Absolute anemia 09/25/2018   Class 3 severe obesity with serious  comorbidity and body mass index (BMI) of 50.0 to 59.9 in adult [facility]) 09/25/2018   IUD strings lost 09/24/2018   Thickened endometrium 07/20/2018   History of endometrial hyperplasia 07/20/2018   Morbid obesity with BMI of 50.0-59.9, adult (HCC) 07/20/2018   Abnormal uterine bleeding (AUB)    Hypertension 02/25/2012   Hx of Bell's palsy 02/25/2012    PCP: Raymond de Peru MD  REFERRING PROVIDER: Ranelle Oyster, MD  REFERRING DIAG: [phone] (ICD-10-CM) - Spondylosis of lumbar spine M54.16 (ICD-10-CM) - Lumbar radiculopathy Diagnosis: Chronic low back pain and radiculopathy RLE, L4   Rx: Eval and Treat. McKenzie technique, low back and core strengthening and lengthening  Rationale for Evaluation and Treatment: Rehabilitation  THERAPY DIAG:  Other low back pain  Other abnormalities of gait and mobility  Muscle weakness (generalized)  Right knee pain, unspecified chronicity  Other symptoms and signs involving the musculoskeletal system  Pain in right hip  ONSET DATE: 3 years  SUBJECTIVE:                                                                                                                                                                                           SUBJECTIVE STATEMENT: Pt reports constant pain. States she was in a lot of pain during aquatics. Pt states it's been up and down -- 3 weeks ago she was okay. A lot of knee pain today. Will be seeing her pain doctor tomorrow. Feeling it in bilat heels and knees today. Pt states she is dreading her vacation because of the walking.   PERTINENT HISTORY:  Increased BMI, chronic pain, HTN, history of Bell's palsy, anemia, headaches, dyspnea with exertion   PAIN:  Are you having pain? yes: NPRS scale: 6/10 Head,  knees, back, and L foot  Pain location: see above Pain description: sharp/ ache Aggravating factors: standing, walking, driving Relieving factors: sitting in recliner, ice  PRECAUTIONS: None  WEIGHT  BEARING RESTRICTIONS: No  FALLS:  Has patient fallen in last 6 months? 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 Redemonstrated right foraminal disc protrusion at L4-L5 which likely contacts the exiting right L4 nerve root, unchanged. 2. Moderate bilateral facet degenerative change at L3-L4 and L5-S1, unchanged.  MRI 06/06/23 R knee IMPRESSION: 1. Tricompartmental osteoarthritis, with substantial lateral marginal spurring of the patella. 2. Moderate knee effusion. 3. Thickened distal iliotibial band but without surrounding edema to further suggest iliotibial band syndrome.  PATIENT SURVEYS:  LEFS 14/80   =18%  10/15/23 =33/80, 41%   SCREENING FOR RED FLAGS: Bowel or bladder incontinence: No Spinal tumors: No Cauda equina syndrome: No Compression fracture: No Abdominal aneurysm: No   COGNITION: Overall cognitive status: Within functional limits for tasks assessed                          SENSATION: WFL   POSTURE: rounded shoulders and forward head   PALPATION: TTP R glutes/piriformis, R knee at joint line   LUMBAR ROM:    AROM eval  Flexion 0% limited   Extension 25% limited  Right lateral flexion 25% limited  Left lateral flexion 25% limited  Right rotation    Left rotation     (Blank rows = not tested) * = pain/symptoms   LOWER EXTREMITY ROM:   WFL for tasks assessed   Active  Right eval Left eval  Hip flexion      Hip extension      Hip abduction      Hip adduction      Hip internal rotation      Hip external rotation      Knee flexion      Knee extension      Ankle dorsiflexion      Ankle plantarflexion      Ankle inversion      Ankle eversion       (Blank rows = not tested) * = pain/symptoms   LOWER  EXTREMITY MMT:     MMT Right eval Left eval Right 3/20 Left 3/20  Hip flexion 42.8 47.9 41.8 42.6  Hip extension        Hip abduction 56.4 65.1    Hip adduction        Hip internal rotation        Hip external rotation        Knee flexion 4- (seated) 4+ (seated) 5/5 seated 5/5 seated  Knee extension 46.2 76.6 41.4   Ankle dorsiflexion        Ankle plantarflexion        Ankle inversion        Ankle eversion         (Blank rows = not tested) * = pain/symptoms       FUNCTIONAL TESTS:  5 times sit to stand:  Initial/Current:  24. 46  seconds with some R UE support on chair /  21.85 sec without UE support 2 minute walk test: 328 feet    TODAY'S TREATMENT:   [facility] Adult PT Treatment:                                                DATE: 11/05/23 Therapeutic Exercise: Nustep L4  x 10 min Seated hip flexor stretch 2x30" Seated calf stretch with strap 2x30" LAQ red TB 2x10 Manual Therapy: IASTM with massage roller bilat gastroc/soleus IASTM with massage roller bilat quads Therapeutic Activity: Side step with red TB around thighs x10 but too much on low back Hip hinge into glute setting at bars 2x10 Standing hip abd red TB around thighs 2x10 Self Care: Discussed sleep habits and meditation as it pertains to decreasing pain levels and overall muscle tightness   [facility] Adult PT Treatment:                                                DATE: 10/22/23 Pt seen for aquatic therapy today.  Treatment took place in water 3.5-4.75 ft in depth at the [facility] pool. Temp of water was 91.  Pt entered/exited the pool via stairs  with bilat rail.   * walking forward / backward unsupported, with arm swing * side stepping with arm addct/ abdct  * marching forward / backward painful * wide stance with arm horiz abdct/ addct and reciprocal arm swing with light resistance bells * seated in lift chair: cycling, hip abdct/ addct, flutter kick, LAQ (painful) * side step with arm addct/  abdct with light resistance bells (painful in L ankle) * L stretch at wall * wall push up/push off x 10 * TrA set with half hollow noodle pull down to thighs x 10 * back against wall:  3 way LE stretch with hollow noodle at ankle, (ITB, hamstring, adductor)       PATIENT EDUCATION:  Education details:  Exercise form, POC, rationale of exercises, relevant anatomy Person educated: Patient Education method: Explanation, Demonstration, verbal and tactile cues Education comprehension: verbalized understanding, returned demonstration, verbal cues required, and tactile cues required  HOME EXERCISE PROGRAM: Access Code: [email or web address] URL: [email or web address] Date: 11/05/2023 Prepared by: Vernon Prey April Kirstie Peri  Exercises - Seated Hamstring Set  - 2-3 x daily - 7 x weekly - 10 reps - 5-10 second hold - Supine Transversus Abdominis Bracing - Hands on Stomach  - 2 x daily - 7 x weekly - 2 sets - 10 reps - Hooklying Clamshell with Resistance  - 1 x daily - 4-5 x weekly - 2 sets - 10 reps - Supine March  - 1 x daily - 7 x weekly - 2 sets - 10 reps - Supine Core Control with Leg Extension  - 1 x daily - 7 x weekly - 2 sets - 10 reps - Standing Shoulder Row with Anchored Resistance  - 1 x daily - 5 x weekly - 2 sets - 10 reps - Shoulder extension with resistance - Neutral  - 1 x daily - 4-5 x weekly - 2 sets - 10 reps - Seated Knee Extension with Resistance  - 1 x daily - 7 x weekly - 2 sets - 10 reps - 3 sec hold - Standing 'L' Stretch at Counter  - 1 x daily - 7 x weekly - 2 sets - 10 reps - Hip Abduction with Resistance Loop  - 1 x daily - 7 x weekly - 2 sets - 10 reps   ASSESSMENT:  CLINICAL IMPRESSION: Pt with increased bilat knee and heel pain today. Provided manual work and stretches to hip flexors/quads and calves to address pain. Rest of session focused on improving posterior chain  activation without lumbar overactivity for improved standing tolerance.      OBJECTIVE  IMPAIRMENTS: Abnormal gait, decreased activity tolerance, decreased balance, decreased endurance, decreased mobility, difficulty walking, decreased ROM, decreased strength, increased muscle spasms, impaired flexibility, improper body mechanics, postural dysfunction, obesity, and pain.   ACTIVITY LIMITATIONS: carrying, lifting, bending, standing, squatting, stairs, transfers, reach over head, hygiene/grooming, locomotion level, and caring for others  PARTICIPATION LIMITATIONS: meal prep, cleaning, laundry, driving, shopping, community activity, occupation, and yard work  PERSONAL FACTORS: Fitness, Time since onset of injury/illness/exacerbation, and 3+ comorbidities: Increased BMI, chronic pain, HTN  are also affecting patient's functional outcome.   REHAB POTENTIAL: Good  CLINICAL DECISION MAKING: Stable/uncomplicated  EVALUATION COMPLEXITY: Low   GOALS: Goals reviewed with patient? Yes  SHORT TERM GOALS: Target date: 09/12/2023    Patient will be independent with HEP in order to improve functional outcomes. Baseline:  Goal status: GOAL MET  2.  Patient will report at least 25% improvement in symptoms for improved quality of life. Baseline:  Goal status: GOAL MET   LONG TERM GOALS: Target date: 11/21/2023    Patient will report at least 75% improvement in symptoms for improved quality of life. Baseline:  Goal status: 73% MET  2.  Patient will improve LEFS score by at least 18 points in order to indicate improved tolerance to activity. Baseline: 14/80, see above Goal status: MET - 10/15/23  3.  Patient will be able to complete 5x STS in under 14 seconds in order to demonstrate improved functional strength. Baseline: 24. 46 seconds with some R UE support on chair Goal status: PROGRESSING  3/20  4.  Patient will be able to ambulate at least 400 feet in in order to demonstrate improved tolerance to activity. Baseline: 328 feet at eval;  319 ft on 10/15/23 Goal status: In  progress   5.  Patient will demonstrate grade of 5/5 MMT or 10lb increase in force in all tested musculature as evidence of improved strength to assist with stair ambulation and gait.   Baseline: see above Goal status: partially met  3/20     PLAN:  PT FREQUENCY: 2x/wk  PT DURATION: 5 weeks  PLANNED INTERVENTIONS: 97164- PT Re-evaluation, 97110-Therapeutic exercises, 97530- Therapeutic activity, 97112- Neuromuscular re-education, 97535- Self Care, 16109- Manual therapy, [phone]- Gait training, [phone]- Orthotic Fit/training, [phone]- Canalith repositioning, [id number]- Aquatic Therapy, [phone]- Splinting, Patient/Family education, Balance training, Stair training, Taping, Dry Needling, Joint mobilization, Joint manipulation, Spinal manipulation, Spinal mobilization, Scar mobilization, and DME instructions.  PLAN FOR NEXT SESSION: core and postural strength, LE strength and progress as tolerated.  Cont with land and aquatic therapy.     [facility] April Randalyn Rhea [place], [place] 11/05/23 3:16 PM [facility] [address] [place], [place], [address] Phone: [phone]   Fax:  [phone]  For all possible CPT codes, reference the Planned Interventions line above.                         Check all conditions that are expected to impact treatment: {Conditions expected to impact treatment:Morbid obesity and Respiratory disorders               If treatment provided at initial evaluation, no treatment charged due to lack of authorization.

## 2023-11-06 ENCOUNTER — Encounter
Payer: Managed Care, Other (non HMO) | Attending: Physical Medicine & Rehabilitation | Admitting: Physical Medicine & Rehabilitation

## 2023-11-06 ENCOUNTER — Encounter: Payer: Self-pay | Admitting: Physical Medicine & Rehabilitation

## 2023-11-06 ENCOUNTER — Other Ambulatory Visit (HOSPITAL_BASED_OUTPATIENT_CLINIC_OR_DEPARTMENT_OTHER): Payer: Self-pay

## 2023-11-06 ENCOUNTER — Ambulatory Visit (HOSPITAL_BASED_OUTPATIENT_CLINIC_OR_DEPARTMENT_OTHER): Payer: Managed Care, Other (non HMO) | Admitting: Family Medicine

## 2023-11-06 VITALS — BP 139/79 | HR 87 | Ht 65.0 in | Wt 335.0 lb

## 2023-11-06 DIAGNOSIS — M1711 Unilateral primary osteoarthritis, right knee: Secondary | ICD-10-CM | POA: Insufficient documentation

## 2023-11-06 DIAGNOSIS — G894 Chronic pain syndrome: Secondary | ICD-10-CM

## 2023-11-06 DIAGNOSIS — M5416 Radiculopathy, lumbar region: Secondary | ICD-10-CM | POA: Insufficient documentation

## 2023-11-06 DIAGNOSIS — M47816 Spondylosis without myelopathy or radiculopathy, lumbar region: Secondary | ICD-10-CM | POA: Diagnosis present

## 2023-11-06 DIAGNOSIS — M1712 Unilateral primary osteoarthritis, left knee: Secondary | ICD-10-CM | POA: Insufficient documentation

## 2023-11-06 DIAGNOSIS — M545 Low back pain, unspecified: Secondary | ICD-10-CM

## 2023-11-06 DIAGNOSIS — M170 Bilateral primary osteoarthritis of knee: Secondary | ICD-10-CM | POA: Diagnosis not present

## 2023-11-06 MED ORDER — OXYCODONE HCL 10 MG PO TABS
10.0000 mg | ORAL_TABLET | Freq: Three times a day (TID) | ORAL | 0 refills | Status: DC | PRN
  Filled 2023-11-06: qty 75, 25d supply, fill #0

## 2023-11-06 NOTE — Progress Notes (Signed)
 Subjective:    Patient ID: Molly Archer, female    DOB: [birth year], 43 y.o.   MRN: [id number]  HPI  Molly Archer is here in follow-up of her chronic pain.  She is having ongoing pain in her low back and both knees.  Now her left knee seems to be bothering more than right but they both cause her significant discomfort.  She has had some benefits with her back pain from therapy.  They are trying to focus on low impact exercises as well including aquatic therapy.  She never started the nortriptyline at bedtime.  She is not really sure as to why other than she was afraid to start on a new medication.  She remains on diclofenac as well as oxycodone as needed for more severe pain.  I reviewed her scans once again which demonstrate an L4 radiculopathy on the right as well as moderate degenerative changes in the right knee.  She has not seen orthopedics for some time now.  She last had injections just prior to seeing me here in the fall.  None of the epidural steroid injections that she had were helpful.  She is experiencing more depression and frustration over her ongoing pain.  Sometimes she becomes fairly emotional about it.  She is also had some issues with her digestion since being on antibiotics earlier this winter and she does not feel that she is gotten her normal pattern back.  She also feels like she has cramping at times in her abdomen.  She is taking stool softeners.  She is not on any probiotics.  She saw the dietitian after her last visit but really did nothing with information.  She felt it was a bit overwhelming.  She still reports that she is not eating well.  She does not eat a lot but when she does eat she makes very poor choices.  Her exercise is minimal.    Pain Inventory Average Pain 8 Pain Right Now 6 My pain is stabbing, tingling, and aching  In the last 24 hours, has pain interfered with the following? General activity 8 Relation with others 8 Enjoyment of life 8 What  TIME of day is your pain at its worst? morning , daytime, evening, and night Sleep (in general) Good  Pain is worse with: walking, inactivity, and standing Pain improves with: rest, medication, and ICE Relief from Meds: 7  Family History  Problem Relation Age of Onset   Hypertension Mother    Hyperlipidemia Mother    Hypertension Father    Stroke Maternal Grandmother    Hypertension Other    Diabetes Other    Cancer Other    Social History   Socioeconomic History   Marital status: Single    Spouse name: Not on file   Number of children: Not on file   Years of education: Not on file   Highest education level: Bachelor's degree (e.g., BA, AB, BS)  Occupational History   Not on file  Tobacco Use   Smoking status: Never    Passive exposure: Never   Smokeless tobacco: Never  Vaping Use   Vaping status: Never Used  Substance and Sexual Activity   Alcohol use: No   Drug use: No   Sexual activity: Not Currently    Birth control/protection: None  Other Topics Concern   Not on file  Social History Narrative   She is from [place], [place], moved here 1998.    Lives at home by herself.  Social Drivers of Corporate investment banker Strain: High Risk (07/13/2023)   Overall Financial Resource Strain (CARDIA)    Difficulty of Paying Living Expenses: Very hard  Food Insecurity: Food Insecurity Present (07/13/2023)   Hunger Vital Sign    Worried About Running Out of Food in the Last Year: Often true    Ran Out of Food in the Last Year: Sometimes true  Transportation Needs: No Transportation Needs (07/13/2023)   PRAPARE - Administrator, Civil Service (Medical): No    Lack of Transportation (Non-Medical): No  Physical Activity: Insufficiently Active (07/13/2023)   Exercise Vital Sign    Days of Exercise per Week: 1 day    Minutes of Exercise per Session: 10 min  Stress: Stress Concern Present (07/13/2023)   [employer] of Occupational Health - Occupational  Stress Questionnaire    Feeling of Stress : To some extent  Social Connections: Moderately Isolated (07/13/2023)   Social Connection and Isolation Panel [NHANES]    Frequency of Communication with Friends and Family: Once a week    Frequency of Social Gatherings with Friends and Family: Never    Attends Religious Services: More than 4 times per year    Active Member of Clubs or Organizations: Yes    Attends Banker Meetings: 1 to 4 times per year    Marital Status: Never married   Past Surgical History:  Procedure Laterality Date   COMBINED HYSTEROSCOPY DIAGNOSTIC / D&C N/A 2011   Benign secretory endometrium   CYSTOSCOPY WITH STENT PLACEMENT Left 10/19/2014   Procedure: CYSTOSCOPY WITH STENT PLACEMENT;  Surgeon: Crist Fat, MD;  Location: [facility];  Service: Urology;  Laterality: Left;   DILATION AND CURETTAGE OF UTERUS  10/2003   Polyp, mild atypia, simple and complex hyperplasia,   DILATION AND CURETTAGE OF UTERUS  11/2003   Simple and complex hyperplasia   DILATION AND CURETTAGE OF UTERUS  2011   DILATION AND CURETTAGE OF UTERUS N/A 12/14/2014   Procedure: DILATATION AND CURETTAGE;  Surgeon: Catalina Antigua, MD;  Location: [facility];  Service: Gynecology;  Laterality: N/A;   HOLMIUM LASER APPLICATION Left 10/19/2014   Procedure: HOLMIUM LASER APPLICATION;  Surgeon: Crist Fat, MD;  Location: [facility];  Service: Urology;  Laterality: Left;   HYSTEROSCOPY WITH D & C N/A 07/21/2018   Procedure: DILATATION AND CURETTAGE /HYSTEROSCOPY;  Surgeon: Adam Phenix, MD;  Location: [facility];  Service: Gynecology;  Laterality: N/A;   INTRAUTERINE DEVICE (IUD) INSERTION N/A 12/14/2014   Procedure: INTRAUTERINE DEVICE (IUD) INSERTION;  Surgeon: Catalina Antigua, MD;  Location: [facility];  Service: Gynecology;  Laterality: N/A;   STONE EXTRACTION WITH BASKET Left 10/19/2014   Procedure: STONE EXTRACTION WITH BASKET;  Surgeon: Crist Fat, MD;  Location: [facility];  Service: Urology;   Laterality: Left;   WISDOM TOOTH EXTRACTION     Past Surgical History:  Procedure Laterality Date   COMBINED HYSTEROSCOPY DIAGNOSTIC / D&C N/A 2011   Benign secretory endometrium   CYSTOSCOPY WITH STENT PLACEMENT Left 10/19/2014   Procedure: CYSTOSCOPY WITH STENT PLACEMENT;  Surgeon: Crist Fat, MD;  Location: [facility];  Service: Urology;  Laterality: Left;   DILATION AND CURETTAGE OF UTERUS  10/2003   Polyp, mild atypia, simple and complex hyperplasia,   DILATION AND CURETTAGE OF UTERUS  11/2003   Simple and complex hyperplasia   DILATION AND CURETTAGE OF UTERUS  2011   DILATION AND CURETTAGE OF UTERUS N/A 12/14/2014  Procedure: DILATATION AND CURETTAGE;  Surgeon: Catalina Antigua, MD;  Location: [facility];  Service: Gynecology;  Laterality: N/A;   HOLMIUM LASER APPLICATION Left 10/19/2014   Procedure: HOLMIUM LASER APPLICATION;  Surgeon: Crist Fat, MD;  Location: [facility];  Service: Urology;  Laterality: Left;   HYSTEROSCOPY WITH D & C N/A 07/21/2018   Procedure: DILATATION AND CURETTAGE /HYSTEROSCOPY;  Surgeon: Adam Phenix, MD;  Location: [facility];  Service: Gynecology;  Laterality: N/A;   INTRAUTERINE DEVICE (IUD) INSERTION N/A 12/14/2014   Procedure: INTRAUTERINE DEVICE (IUD) INSERTION;  Surgeon: Catalina Antigua, MD;  Location: [facility];  Service: Gynecology;  Laterality: N/A;   STONE EXTRACTION WITH BASKET Left 10/19/2014   Procedure: STONE EXTRACTION WITH BASKET;  Surgeon: Crist Fat, MD;  Location: [facility];  Service: Urology;  Laterality: Left;   WISDOM TOOTH EXTRACTION     Past Medical History:  Diagnosis Date   Anemia    Asthma    only uses inhaler in fall and spring   Constipation    GERD (gastroesophageal reflux disease)    diet controlled, occ. uses omeprazole   Headache    otc med prn   History of kidney stones    Hx of Bell's palsy June 2013   Hypertension    currently no meds   Menometrorrhagia    Prediabetes    Psoriasis    Shortness of breath  dyspnea    with exercise/exertion   Vitamin D deficiency    There were no vitals taken for this visit.  Opioid Risk Score:   Fall Risk Score:  `1  Depression screen [facility] 2/9     10/14/2023    3:52 PM 09/25/2023    3:53 PM 09/09/2023    8:54 AM 08/26/2023    3:04 PM 07/17/2023    2:35 PM 07/16/2023    2:58 PM 06/25/2023   10:41 AM  Depression screen PHQ 2/9  Decreased Interest 0 0 0 1 0 0 0  Down, Depressed, Hopeless 0 0 0 3 0 0 0  PHQ - 2 Score 0 0 0 4 0 0 0  Altered sleeping 0 0 0 3  0 3  Tired, decreased energy 0 0 0 3  0 3  Change in appetite 0 0 0 0  0 0  Feeling bad or failure about yourself  0 0 0 1  0 0  Trouble concentrating 0 0 0 0  0 0  Moving slowly or fidgety/restless 0 0 0 1  0 0  Suicidal thoughts 0 0 0 0  0 0  PHQ-9 Score 0 0 0 12  0 6  Difficult doing work/chores Not difficult at all Not difficult at all Not difficult at all Somewhat difficult  Not difficult at all Somewhat difficult    Review of Systems  Musculoskeletal:  Positive for back pain.       Pain in both knees, heels of both feet  All other systems reviewed and are negative.      Objective:   Physical Exam General: No acute distress. obese HEENT: NCAT, EOMI, oral membranes moist Cards: reg rate  Chest: normal effort Abdomen: Soft, NT, ND Skin: dry, intact Extremities: no edema Psych: pleasant and appropriate   Skin: intact Neuro: Alert and oriented x 3. Normal insight and awareness. Intact Memory. Normal language and speech. Cranial nerve exam unremarkable. MMT: 5/5 in all 4's. Sensory exam normal for light touch and pain in all 4 limbs. No limb ataxia or cerebellar  signs. No abnormal tone appreciated.  .   Musculoskeletal: Patient with generalized tenderness in both knees along jt line and with meniscal maneuvers and resisted flextion/extension. SLR is negative. SST is negative.. Low back with mild tenderness to palpation on right. She walks with antalgia favoring both knees            Assessment & Plan:    Chronic pain syndrome with chronic low back pain, bilateral OA of knees.  She has had appropriate treatment thus far including epidural injections, therapy, numerous muscle relaxants NSAIDs and medications for neuropathic pain.    -while her MRI indicates a right L4 radiculopathy, her exam doesn't indicate that. ESI's were unhelpful as well.  - I think the main driver of her pain is both knees.  Morbid obesity.  I do feel that this is a major driver for her lower extremity pain, particularly the knees.  She has made some efforts in the past to lose her weight but states that she has always been heavy.    Reactive depression       Plan: Continue with  outpt PT for Mckenzie technique, core strengthening and lengthening, aquatic rx She has met with dietician regarding eating habits. Needs to start working on healthier eating habits in earnest.  Will try agaqin  pamelor 10-20mg  at bedtime for sleep and neuropathic pain. Has tried gabapentin and lyrica without benefit before. Consider cymbalta trial Low impact exercising as possible. Encouraged work in the pool UDS ok Maintain oxycodone to 10mg  every 8 hours as needed #75--RF today We will continue the controlled substance monitoring program, this consists of regular clinic visits, examinations, routine drug screening, pill counts as well as use of [place] Controlled Substance Reporting System. [place] today.      7.  Continue diclofenac 75 mg p.o. twice daily with food as long as she can tolerate from GI standpoint.   8. Made referral backl to Dr. Steward Drone who she's seen previously. Other than further injections, I have nothing else to offer her for knees.      20+minutes of face to face patient care time were spent during this visit. All questions were encouraged and answered. Follow up with me in about 2 mos

## 2023-11-06 NOTE — Patient Instructions (Addendum)
 ALWAYS FEEL FREE TO CALL OUR OFFICE WITH ANY PROBLEMS OR QUESTIONS [phone])  **PLEASE NOTE** ALL MEDICATION REFILL REQUESTS (INCLUDING CONTROLLED SUBSTANCES) NEED TO BE MADE AT LEAST 7 DAYS PRIOR TO REFILL BEING DUE. ANY REFILL REQUESTS INSIDE THAT TIME FRAME MAY RESULT IN DELAYS IN RECEIVING YOUR PRESCRIPTION.    Nortriptyline 10mg - at bedtime. Take for 5 days, then increase to 20mg  if no results (and no problems).   Work on diet/ weight loss.   Probiotic for your bowels.  Greek yogurt. Or you can get in capsules or tablets at drug store.

## 2023-11-08 ENCOUNTER — Encounter (HOSPITAL_BASED_OUTPATIENT_CLINIC_OR_DEPARTMENT_OTHER): Admitting: Physical Therapy

## 2023-11-11 ENCOUNTER — Ambulatory Visit: Admitting: Licensed Clinical Social Worker

## 2023-11-12 ENCOUNTER — Ambulatory Visit (HOSPITAL_BASED_OUTPATIENT_CLINIC_OR_DEPARTMENT_OTHER): Admitting: Family Medicine

## 2023-11-12 ENCOUNTER — Encounter (HOSPITAL_BASED_OUTPATIENT_CLINIC_OR_DEPARTMENT_OTHER): Payer: Self-pay | Admitting: Family Medicine

## 2023-11-12 ENCOUNTER — Ambulatory Visit: Admitting: Licensed Clinical Social Worker

## 2023-11-12 VITALS — BP 138/79 | HR 82 | Ht 65.0 in | Wt 332.4 lb

## 2023-11-12 DIAGNOSIS — E1165 Type 2 diabetes mellitus with hyperglycemia: Secondary | ICD-10-CM

## 2023-11-12 DIAGNOSIS — I10 Essential (primary) hypertension: Secondary | ICD-10-CM | POA: Diagnosis not present

## 2023-11-12 DIAGNOSIS — E66813 Obesity, class 3: Secondary | ICD-10-CM | POA: Diagnosis not present

## 2023-11-12 DIAGNOSIS — Z6841 Body Mass Index (BMI) 40.0 and over, adult: Secondary | ICD-10-CM

## 2023-11-12 NOTE — Patient Instructions (Signed)
   Medication Instructions:  Your physician recommends that you continue on your current medications as directed. Please refer to the Current Medication list given to you today. --If you need a refill on any your medications before your next appointment, please call your pharmacy first. If no refills are authorized on file call the office.-- Lab Work: Your physician has recommended that you have lab work today: today If you have labs (blood work) drawn today and your tests are completely normal, you will receive your results via MyChart message OR a phone call from our staff.  Please ensure you check your voicemail in the event that you authorized detailed messages to be left on a delegated number. If you have any lab test that is abnormal or we need to change your treatment, we will call you to review the results.    Follow-Up: Your next appointment:   Your physician recommends that you schedule a follow-up appointment in: 3-4 month follow up  with Dr. de Peru  You will receive a text message or e-mail with a link to a survey about your care and experience with [place] today! We would greatly appreciate your feedback!   Thanks for letting us be apart of your health journey!!  Primary Care and Sports Medicine   Dr. Ceasar Mons Peru   We encourage you to activate your patient portal called "MyChart".  Sign up information is provided on this After Visit Summary.  MyChart is used to connect with patients for Virtual Visits (Telemedicine).  Patients are able to view lab/test results, encounter notes, upcoming appointments, etc.  Non-urgent messages can be sent to your provider as well. To learn more about what you can do with MyChart, please visit --  [email or web address].

## 2023-11-12 NOTE — Assessment & Plan Note (Signed)
 See above for additional history.  Patient only utilizing glipizide currently due to side effects without medications.  She has had elevations in A1c recently and concerned that current A1c is likely running high.  We will check A1c today.  Did discuss that if A1c has further increased, may need to consider beginning on insulin to better control blood sugars for patient and to ensure that this is not contributing to her ongoing constellation of symptoms

## 2023-11-12 NOTE — Progress Notes (Signed)
 Procedures performed today:    None.  Independent interpretation of notes and tests performed by another provider:   None.  Brief History, Exam, Impression, and Recommendations:    BP 138/79 (BP Location: Left Arm, Patient Position: Sitting, Cuff Size: Large)   Pulse 82   Ht 5\' 5"  (1.651 m)   Wt (!) 332 lb 6.4 oz (150.8 kg)   SpO2 100%   BMI 55.31 kg/m   Patient continues to have ongoing issues with stomach pain and generally not feeling well.  She reports having "fevers" with reported Tmax of 100.1.  She also reports having general body aches.  Has been having nausea.  Has not had any vomiting but has felt close to this.  She does have appointment upcoming with GI, this is early next week.  She has not had any recent medication changes, however she has been off of metformin for a while due to GI issues in the past.  More recently with current issues ongoing, we did stop Mounjaro, however symptoms have generally persisted. She had evaluation in the emergency department which was generally unremarkable and did not reveal specific underlying etiology.  She was started on PPI for presumptive treatment of reflux as well as antibiotics for possible UTI.  Urine culture ultimately was negative.  She has not noticed significant improvement in symptoms with use of PPI. Patient does have notable chronic pain issues for which she has been following with PM&R specialist with adjustments in her medications being made.  Continues to have symptoms despite this. Patient is in no acute distress today.  Vital signs are stable. Have reviewed prior notes, test, imaging with other providers related to ongoing concern.  Reviewed visits with specialist as well as recent visits to emergency department.  Ultimately, uncertain cause for continued symptoms.  I do feel that she does need to have further evaluation with GI given ongoing symptoms and lack of clear source of symptoms found.  I am concerned about  possible blood sugar elevations which certainly would not be helpful in regards to symptoms that she has been experiencing.  As below, we will check A1c today for further assessment.  Will additionally check labs as below.  Type 2 diabetes mellitus with hyperglycemia, without long-term current use of insulin (HCC) Assessment & Plan: See above for additional history.  Patient only utilizing glipizide currently due to side effects without medications.  She has had elevations in A1c recently and concerned that current A1c is likely running high.  We will check A1c today.  Did discuss that if A1c has further increased, may need to consider beginning on insulin to better control blood sugars for patient and to ensure that this is not contributing to her ongoing constellation of symptoms  Orders: -     Hemoglobin A1c  Class 3 severe obesity with serious comorbidity and body mass index (BMI) of 50.0 to 59.9 in adult, unspecified obesity type (HCC) -     CBC with Differential/Platelet -     Comprehensive metabolic panel with GFR -     TSH Rfx on Abnormal to Free T4  Primary hypertension Assessment & Plan: Blood pressure elevated in office today, did improve on recheck.  Can continue with current regimen, continue with scheduled follow-up with cardiology No changes to medications today Recommend intermittent monitoring of blood pressure at home, DASH diet  Orders: -     CBC with Differential/Platelet -     Comprehensive metabolic panel with GFR -  TSH Rfx on Abnormal to Free T4  Return in about 3 months (around 02/11/2024).  Spent 35 minutes on this patient encounter, including preparation, chart review, face-to-face counseling with patient and coordination of care, and documentation of encounter   ___________________________________________ Witney Huie de [place], MD, ABFM, [facility] Primary Care and Sports Medicine [facility]

## 2023-11-12 NOTE — Assessment & Plan Note (Signed)
 Blood pressure elevated in office today, did improve on recheck.  Can continue with current regimen, continue with scheduled follow-up with cardiology No changes to medications today Recommend intermittent monitoring of blood pressure at home, DASH diet

## 2023-11-13 LAB — COMPREHENSIVE METABOLIC PANEL WITH GFR
ALT: 27 IU/L (ref 0–32)
AST: 16 IU/L (ref 0–40)
Albumin: 4.5 g/dL (ref 3.9–4.9)
Alkaline Phosphatase: 61 IU/L (ref 44–121)
BUN/Creatinine Ratio: 15 (ref 9–23)
BUN: 11 mg/dL (ref 6–24)
Bilirubin Total: 0.6 mg/dL (ref 0.0–1.2)
CO2: 20 mmol/L (ref 20–29)
Calcium: 9.8 mg/dL (ref 8.7–10.2)
Chloride: 102 mmol/L (ref 96–106)
Creatinine, Ser: 0.71 mg/dL (ref 0.57–1.00)
Globulin, Total: 2.8 g/dL (ref 1.5–4.5)
Glucose: 92 mg/dL (ref 70–99)
Potassium: 4.2 mmol/L (ref 3.5–5.2)
Sodium: 142 mmol/L (ref 134–144)
Total Protein: 7.3 g/dL (ref 6.0–8.5)
eGFR: 109 mL/min/{1.73_m2} (ref >59)

## 2023-11-13 LAB — CBC WITH DIFFERENTIAL/PLATELET
Basophils Absolute: 0.1 10*3/uL (ref 0.0–0.2)
Basos: 1 %
EOS (ABSOLUTE): 0.2 10*3/uL (ref 0.0–0.4)
Eos: 4 %
Hematocrit: 43.8 % (ref 34.0–46.6)
Hemoglobin: 14.2 g/dL (ref 11.1–15.9)
Immature Grans (Abs): 0.1 10*3/uL (ref 0.0–0.1)
Immature Granulocytes: 1 %
Lymphocytes Absolute: 1.6 10*3/uL (ref 0.7–3.1)
Lymphs: 26 %
MCH: 27.3 pg (ref 26.6–33.0)
MCHC: 32.4 g/dL (ref 31.5–35.7)
MCV: 84 fL (ref 79–97)
Monocytes Absolute: 0.6 10*3/uL (ref 0.1–0.9)
Monocytes: 9 %
Neutrophils Absolute: 3.7 10*3/uL (ref 1.4–7.0)
Neutrophils: 59 %
Platelets: 522 10*3/uL — ABNORMAL HIGH (ref 150–450)
RBC: 5.2 x10E6/uL (ref 3.77–5.28)
RDW: 14.6 % (ref 11.7–15.4)
WBC: 6.2 10*3/uL (ref 3.4–10.8)

## 2023-11-13 LAB — HEMOGLOBIN A1C
Est. average glucose Bld gHb Est-mCnc: 183 mg/dL
Hgb A1c MFr Bld: 8 % — ABNORMAL HIGH (ref 4.8–5.6)

## 2023-11-13 LAB — TSH RFX ON ABNORMAL TO FREE T4: TSH: 0.746 u[IU]/mL (ref 0.450–4.500)

## 2023-11-18 ENCOUNTER — Encounter: Payer: Self-pay | Admitting: Gastroenterology

## 2023-11-18 ENCOUNTER — Other Ambulatory Visit (HOSPITAL_BASED_OUTPATIENT_CLINIC_OR_DEPARTMENT_OTHER): Payer: Self-pay | Admitting: Family Medicine

## 2023-11-18 ENCOUNTER — Ambulatory Visit: Admitting: Gastroenterology

## 2023-11-18 VITALS — BP 140/80 | HR 77 | Ht 65.0 in | Wt 337.0 lb

## 2023-11-18 DIAGNOSIS — R1013 Epigastric pain: Secondary | ICD-10-CM | POA: Diagnosis not present

## 2023-11-18 DIAGNOSIS — R110 Nausea: Secondary | ICD-10-CM | POA: Diagnosis not present

## 2023-11-18 DIAGNOSIS — R6881 Early satiety: Secondary | ICD-10-CM

## 2023-11-18 DIAGNOSIS — E119 Type 2 diabetes mellitus without complications: Secondary | ICD-10-CM

## 2023-11-18 DIAGNOSIS — E1165 Type 2 diabetes mellitus with hyperglycemia: Secondary | ICD-10-CM

## 2023-11-18 DIAGNOSIS — K5900 Constipation, unspecified: Secondary | ICD-10-CM

## 2023-11-18 DIAGNOSIS — T402X5A Adverse effect of other opioids, initial encounter: Secondary | ICD-10-CM

## 2023-11-18 DIAGNOSIS — K5903 Drug induced constipation: Secondary | ICD-10-CM | POA: Diagnosis not present

## 2023-11-18 DIAGNOSIS — Z7984 Long term (current) use of oral hypoglycemic drugs: Secondary | ICD-10-CM

## 2023-11-18 NOTE — Patient Instructions (Addendum)
 You have been scheduled for a gastric emptying scan at [facility] on 12/02/23 at 7:30am. Please arrive at least 30 minutes prior to your appointment for registration. Please make certain not to have anything to eat or drink after midnight the night before your test. Hold all stomach medications (ex: Zofran , phenergan , Reglan , Pantoprazole ) 24 hours prior to your test. If you need to reschedule your appointment, please contact radiology scheduling at [phone]. _____________________________________________________________________ A gastric-emptying study measures how long it takes for food to move through your stomach. There are several ways to measure stomach emptying. In the most common test, you eat food that contains a small amount of radioactive material. A scanner that detects the movement of the radioactive material is placed over your abdomen to monitor the rate at which food leaves your stomach. This test normally takes about 4 hours to complete. _____________________________________________________________________  Due to recent changes in healthcare laws, you may see the results of your imaging and laboratory studies on MyChart before your provider has had a chance to review them.  We understand that in some cases there may be results that are confusing or concerning to you. Not all laboratory results come back in the same time frame and the provider may be waiting for multiple results in order to interpret others.  Please give us  48 hours in order for your provider to thoroughly review all the results before contacting the office for clarification of your results.   We have given you samples of the following medication to take: Linzess 145 mcg take as directed  Linzess works best when taken once a day every day, on an empty stomach, at least 30 minutes before your first meal of the day.  When Linzess is taken daily as directed:  *Constipation relief is typically felt in about a  week *IBS-C patients may begin to experience relief from belly pain and overall abdominal symptoms (pain, discomfort, and bloating) in about 1 week,   with symptoms typically improving over 12 weeks.  Diarrhea may occur in the first 2 weeks -keep taking it.  The diarrhea should go away and you should start having normal, complete, full bowel movements. It may be helpful to start treatment when you can be near the comfort of your own bathroom, such as a weekend.   _______________________________________________________  If your blood pressure at your visit was 140/90 or greater, please contact your primary care physician to follow up on this.  _______________________________________________________  If you are age 39 or older, your body mass index should be between 23-30. Your Body mass index is 56.08 kg/m. If this is out of the aforementioned range listed, please consider follow up with your Primary Care Provider.  If you are age 79 or younger, your body mass index should be between 19-25. Your Body mass index is 56.08 kg/m. If this is out of the aformentioned range listed, please consider follow up with your Primary Care Provider.   ________________________________________________________  The [facility] providers would like to encourage you to use MYCHART to communicate with providers for non-urgent requests or questions.  Due to long hold times on the telephone, sending your provider a message by [facility] may be a faster and more efficient way to get a response.  Please allow 48 business hours for a response.  Please remember that this is for non-urgent requests.  _______________________________________________________ Thank you for trusting me with your gastrointestinal care!   Bayley McMichael, PA   Gastroparesis Please do small frequent meals  like 4-6 meals a day.  Eat and drink liquids at separate times.  Avoid high fiber foods, cook your vegetables, avoid high fat food.   Suggest spreading protein throughout the day (greek yogurt, glucerna, soft meat, milk, eggs) Choose soft foods that you can mash with a fork When you are more symptomatic, change to pureed foods foods and liquids.  Consider reading "Living well with Gastroparesis" by Creasie Doctor Gastroparesis is a condition in which food takes longer than normal to empty from the stomach. This condition is also known as delayed gastric emptying. It is usually a long-term (chronic) condition. There is no cure, but there are treatments and things that you can do at home to help relieve symptoms. Treating the underlying condition that causes gastroparesis can also help relieve symptoms What are the causes? In many cases, the cause of this condition is not known. Possible causes include: A hormone (endocrine) disorder, such as hypothyroidism or diabetes. A nervous system disease, such as Parkinson's disease or multiple sclerosis. Cancer, infection, or surgery that affects the stomach or vagus nerve. The vagus nerve runs from your chest, through your neck, and to the lower part of your brain. A connective tissue disorder, such as scleroderma. Certain medicines. What increases the risk? You are more likely to develop this condition if: You have certain disorders or diseases. These may include: An endocrine disorder. An eating disorder. Amyloidosis. Scleroderma. Parkinson's disease. Multiple sclerosis. Cancer or infection of the stomach or the vagus nerve. You have had surgery on your stomach or vagus nerve. You take certain medicines. You are female. What are the signs or symptoms? Symptoms of this condition include: Feeling full after eating very little or a loss of appetite. Nausea, vomiting, or heartburn. Bloating of your abdomen. Inconsistent blood sugar (glucose) levels on blood tests. Unexplained weight loss. Acid from the stomach coming up into the esophagus (gastroesophageal  reflux). Sudden tightening (spasm) of the stomach, which can be painful. Symptoms may come and go. Some people may not notice any symptoms. How is this diagnosed? This condition is diagnosed with tests, such as: Tests that check how long it takes food to move through the stomach and intestines. These tests include: Upper gastrointestinal (GI) series. For this test, you drink a liquid that shows up well on X-rays, and then X-rays are taken of your intestines. Gastric emptying scintigraphy. For this test, you eat food that contains a small amount of radioactive material, and then scans are taken. Wireless capsule GI monitoring system. For this test, you swallow a pill (capsule) that records information about how foods and fluid move through your stomach. Gastric manometry. For this test, a tube is passed down your throat and into your stomach to measure electrical and muscular activity. Endoscopy. For this test, a long, thin tube with a camera and light on the end is passed down your throat and into your stomach to check for problems in your stomach lining. Ultrasound. This test uses sound waves to create images of the inside of your body. This can help rule out gallbladder disease or pancreatitis as a cause of your symptoms. How is this treated? There is no cure for this condition, but treatment and home care may relieve symptoms. Treatment may include: Treating the underlying cause. Managing your symptoms by making changes to your diet and exercise habits. Taking medicines to control nausea and vomiting and to stimulate stomach muscles. Getting food through a feeding tube in the hospital. This may be done in severe  cases. Having surgery to insert a device called a gastric electrical stimulator into your body. This device helps improve stomach emptying and control nausea and vomiting. Follow these instructions at home: Take over-the-counter and prescription medicines only as told by your health  care provider. Follow instructions from your health care provider about eating or drinking restrictions. Your health care provider may recommend that you: Eat smaller meals more often. Eat low-fat foods. Eat low-fiber forms of high-fiber foods. For example, eat cooked vegetables instead of raw vegetables. Have only liquid foods instead of solid foods. Liquid foods are easier to digest. Drink enough fluid to keep your urine pale yellow. Exercise as often as told by your health care provider. Keep all follow-up visits. This is important. Contact a health care provider if you: Notice that your symptoms do not improve with treatment. Have new symptoms. Get help right away if you: Have severe pain in your abdomen that does not improve with treatment. Have nausea that is severe or does not go away. Vomit every time you drink fluids. Summary Gastroparesis is a long-term (chronic) condition in which food takes longer than normal to empty from the stomach. Symptoms include nausea, vomiting, heartburn, bloating of your abdomen, and loss of appetite. Eating smaller portions, low-fat foods, and low-fiber forms of high-fiber foods may help you manage your symptoms. Get help right away if you have severe pain in your abdomen. This information is not intended to replace advice given to you by your health care provider. Make sure you discuss any questions you have with your health care provider.

## 2023-11-18 NOTE — Progress Notes (Signed)
 Chief Complaint: Hospital follow-up Primary GI MD: Para Bold  HPI: Discussed the use of AI scribe software for clinical note transcription with the patient, who gave verbal consent to proceed.  History of Present Illness Molly Archer is a 43 year old female with diabetes who presents with persistent abdominal pain and gastrointestinal symptoms.  Severe abdominal pain has been present since March, following the initiation of Mounjaro  x 6 weeks and recent doxycycline  for a respiratory infection. An emergency department visit included an ultrasound, CT scan, and labs, all normal except for a urinary tract infection treated with Bactrim . The pain is described as a persistent 'roller coaster' sensation in the epigastric area, accompanied by nausea, flushing, and an urge to vomit. These symptoms began shortly after starting doxycycline  in February and have persisted since then.  She reports associated early satiety as well.  She has taken pantoprazole  40 Mg once daily with no improvement.  No associated GERD.  History of constipation secondary to chronic narcotic pain medication.  Despite using laxatives and stool softeners like MiraLAX , Colace, fiber, she is ineffective, resulting in straining and right-sided abdominal pain during bowel movements.  She has a history of diabetes, diagnosed approximately five years ago.  She previously used Mounjaro  but discontinued it due to concerns about its side effects.  She is currently on glipizide .  She reports insomnia and difficulty sleeping, often sleeping in a recliner due to discomfort. She also experiences migraines and occasionally uses ibuprofen  as advised by her pain management team.  Family history includes her mother having polyps, but no history of colon cancer. 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 No worsening of symptoms with eating, but there is decreased appetite and early satiety.  She recently  got back from a trip to [place]  PREVIOUS GI WORKUP   CT renal stone study 10/01/2023 - Colonic diverticulosis without diverticulitis - Gallbladder unremarkable - Liver unremarkable  RUQ US  10/01/2023 - Normal RUQ ultrasound without stones and normal liver.  Past Medical History:  Diagnosis Date   Anemia    Asthma    only uses inhaler in fall and spring   Constipation    GERD (gastroesophageal reflux disease)    diet controlled, occ. uses omeprazole    Headache    otc med prn   History of kidney stones    Hx of Bell's palsy June 2013   Hypertension    currently no meds   Menometrorrhagia    Prediabetes    Psoriasis    Shortness of breath dyspnea    with exercise/exertion   Vitamin D  deficiency     Past Surgical History:  Procedure Laterality Date   COMBINED HYSTEROSCOPY DIAGNOSTIC / D&C N/A 2011   Benign secretory endometrium   CYSTOSCOPY WITH STENT PLACEMENT Left 10/19/2014   Procedure: CYSTOSCOPY WITH STENT PLACEMENT;  Surgeon: Andrez Banker, MD;  Location: [facility];  Service: Urology;  Laterality: Left;   DILATION AND CURETTAGE OF UTERUS  10/2003   Polyp, mild atypia, simple and complex hyperplasia,   DILATION AND CURETTAGE OF UTERUS  11/2003   Simple and complex hyperplasia   DILATION AND CURETTAGE OF UTERUS  2011   DILATION AND CURETTAGE OF UTERUS N/A 12/14/2014   Procedure: DILATATION AND CURETTAGE;  Surgeon: Verlyn Goad, MD;  Location: [facility];  Service: Gynecology;  Laterality: N/A;   HOLMIUM LASER APPLICATION Left 10/19/2014   Procedure: HOLMIUM LASER APPLICATION;  Surgeon: Andrez Banker, MD;  Location: [facility];  Service: Urology;  Laterality: Left;   HYSTEROSCOPY WITH D & C N/A 07/21/2018   Procedure: DILATATION AND CURETTAGE /HYSTEROSCOPY;  Surgeon: Tresia Fruit, MD;  Location: [facility];  Service: Gynecology;  Laterality: N/A;   INTRAUTERINE DEVICE (IUD) INSERTION N/A 12/14/2014   Procedure: INTRAUTERINE DEVICE (IUD) INSERTION;  Surgeon: Verlyn Goad,  MD;  Location: [facility];  Service: Gynecology;  Laterality: N/A;   STONE EXTRACTION WITH BASKET Left 10/19/2014   Procedure: STONE EXTRACTION WITH BASKET;  Surgeon: Andrez Banker, MD;  Location: [facility];  Service: Urology;  Laterality: Left;   WISDOM TOOTH EXTRACTION      Current Outpatient Medications  Medication Sig Dispense Refill   albuterol  (VENTOLIN  HFA) 108 (90 Base) MCG/ACT inhaler Inhale 2 puffs into the lungs every 4 (four) hours as needed for wheezing or shortness of breath (cough, shortness of breath or wheezing.). 18 g 1   albuterol  (VENTOLIN  HFA) 108 (90 Base) MCG/ACT inhaler Inhale 2 puffs into the lungs every 4 (four) hours as needed for wheezing or shortness of breath (cough, shortness of breath or wheezing.). 18 g 1   cetirizine (ZYRTEC) 10 MG tablet Take by mouth.     Ciclopirox  1 % shampoo Apply 1 each topically at bedtime. 120 mL 0   clobetasol  ointment (TEMOVATE ) 0.05 % Apply topically 2 (two) times daily. 30 g 1   diclofenac  (VOLTAREN ) 75 MG EC tablet Take 1 tablet (75 mg total) by mouth 2 (two) times daily with a meal. 60 tablet 3   doxazosin  (CARDURA ) 1 MG tablet Take 1 tablet (1 mg total) by mouth daily. 30 tablet 3   fluocinolone  0.01 % cream Apply twice a week.     Fluocinolone  Acetonide Scalp 0.01 % OIL Apply 1 Application topically 2 (two) times a week. 118.28 mL 1   fluticasone  (FLONASE ) 50 MCG/ACT nasal spray Administer 1 spray in each nostril 2 times daily.     glipiZIDE  (GLUCOTROL  XL) 10 MG 24 hr tablet Take 1 tablet (10 mg total) by mouth daily. 90 tablet 1   glucose blood (PRECISION QID TEST) test strip Check glucose twice daily as needed.     hydrochlorothiazide  (MICROZIDE ) 12.5 MG capsule Take 1 capsule (12.5 mg total) by mouth daily. 90 capsule 1   ipratropium-albuterol  (DUONEB) 0.5-2.5 (3) MG/3ML SOLN Inhale 3 mLs into the lungs every 6 (six) hours as needed. 360 mL 5   irbesartan -hydrochlorothiazide  (AVALIDE) 300-12.5 MG tablet Take 1 tablet by mouth  daily. 90 tablet 1   Naftifine  HCl (NAFTIN ) 2 % CREA Apply liberally to affected area daily 60 g 1   NIFEdipine  (PROCARDIA  XL/NIFEDICAL XL) 60 MG 24 hr tablet Take 1 tablet (60 mg total) by mouth daily. 90 tablet 1   norethindrone  (AYGESTIN ) 5 MG tablet Take 3 tablets (15 mg total) by mouth daily. 180 tablet 5   nortriptyline  (PAMELOR ) 10 MG capsule Take 1-2 capsules (10-20 mg total) by mouth at bedtime. 60 capsule 4   ondansetron  (ZOFRAN -ODT) 4 MG disintegrating tablet Take 4 mg by mouth every 8 (eight) hours as needed.     Oxycodone  HCl 10 MG TABS Take 1 tablet (10 mg total) by mouth every 8 (eight) hours as needed. 75 tablet 0   pantoprazole  (PROTONIX ) 40 MG tablet Take 1 tablet (40 mg total) by mouth daily. 30 tablet 0   tiZANidine  (ZANAFLEX ) 4 MG tablet Take 1 tablet (4 mg total) by mouth every 8 (eight) hours as needed for muscle spasms. 30 tablet 0  topiramate  (TOPAMAX ) 50 MG tablet Take 1/2 tablet at bedtime for one week, then increase to 1 tablet at bedtime 30 tablet 0   triamcinolone  cream (KENALOG ) 0.1 % Apply 1 Application topically 2 (two) times daily. 30 g 0   zolpidem  (AMBIEN ) 5 MG tablet Take 1 tablet (5 mg total) by mouth at bedtime as needed for sleep. 30 tablet 0   No current facility-administered medications for this visit.    Allergies as of 11/18/2023 - Review Complete 11/18/2023  Allergen Reaction Noted   Losartan Other (See Comments) 12/14/2019   Penicillins Hives 09/26/2011   Tramadol  Hives 03/12/2023    Family History  Problem Relation Age of Onset   Hypertension Mother    Hyperlipidemia Mother    Hypertension Father    Stroke Maternal Grandmother    Hypertension Other    Diabetes Other    Cancer Other     Social History   Socioeconomic History   Marital status: Single    Spouse name: Not on file   Number of children: Not on file   Years of education: Not on file   Highest education level: Bachelor's degree (e.g., BA, AB, BS)  Occupational History    Not on file  Tobacco Use   Smoking status: Never    Passive exposure: Never   Smokeless tobacco: Never  Vaping Use   Vaping status: Never Used  Substance and Sexual Activity   Alcohol use: No   Drug use: No   Sexual activity: Not Currently    Birth control/protection: None  Other Topics Concern   Not on file  Social History Narrative   She is from [place], [place], moved here 1998.    Lives at home by herself.    Social Drivers of Corporate investment banker Strain: High Risk (07/13/2023)   Overall Financial Resource Strain (CARDIA)    Difficulty of Paying Living Expenses: Very hard  Food Insecurity: Food Insecurity Present (07/13/2023)   Hunger Vital Sign    Worried About Running Out of Food in the Last Year: Often true    Ran Out of Food in the Last Year: Sometimes true  Transportation Needs: No Transportation Needs (07/13/2023)   PRAPARE - Administrator, Civil Service (Medical): No    Lack of Transportation (Non-Medical): No  Physical Activity: Insufficiently Active (07/13/2023)   Exercise Vital Sign    Days of Exercise per Week: 1 day    Minutes of Exercise per Session: 10 min  Stress: Stress Concern Present (07/13/2023)   [employer] of Occupational Health - Occupational Stress Questionnaire    Feeling of Stress : To some extent  Social Connections: Moderately Isolated (07/13/2023)   Social Connection and Isolation Panel [NHANES]    Frequency of Communication with Friends and Family: Once a week    Frequency of Social Gatherings with Friends and Family: Never    Attends Religious Services: More than 4 times per year    Active Member of Clubs or Organizations: Yes    Attends Banker Meetings: 1 to 4 times per year    Marital Status: Never married  Intimate Partner Violence: Not At Risk (10/18/2021)   Received from [facility] visits prior to 09/29/2022., [facility] visits prior to 09/29/2022.    Humiliation, Afraid, Rape, and Kick questionnaire    Fear of Current or Ex-Partner: No    Emotionally Abused: No    Physically Abused: No  Sexually Abused: No    Review of Systems:    Constitutional: No weight loss, fever, chills, weakness or fatigue HEENT: Eyes: No change in vision               Ears, Nose, Throat:  No change in hearing or congestion Skin: No rash or itching Cardiovascular: No chest pain, chest pressure or palpitations   Respiratory: No SOB or cough Gastrointestinal: See HPI and otherwise negative Genitourinary: No dysuria or change in urinary frequency Neurological: No headache, dizziness or syncope Musculoskeletal: No new muscle or joint pain Hematologic: No bleeding or bruising Psychiatric: No history of depression or anxiety    Physical Exam:  Vital signs: Ht 5\' 5"  (1.651 m)   Wt (!) 337 lb (152.9 kg)   BMI 56.08 kg/m   Constitutional: NAD, alert and cooperative.  Morbidly obese Head:  Normocephalic and atraumatic. Eyes:   PEERL, EOMI. No icterus. Conjunctiva pink. Respiratory: Respirations even and unlabored. Lungs clear to auscultation bilaterally.   No wheezes, crackles, or rhonchi.  Cardiovascular:  Regular rate and rhythm. No peripheral edema, cyanosis or pallor.  Gastrointestinal:  Soft, nondistended, nontender. No rebound or guarding. Normal bowel sounds. No appreciable masses or hepatomegaly. Rectal:  Declines Msk:  Symmetrical without gross deformities. Without edema, no deformity or joint abnormality.  Neurologic:  Alert and  oriented x4;  grossly normal neurologically.  Skin:   Dry and intact without significant lesions or rashes. Psychiatric: Oriented to person, place and time. Demonstrates good judgement and reason without abnormal affect or behaviors.   RELEVANT LABS AND IMAGING: CBC    Component Value Date/Time   WBC 6.2 11/12/2023 1629   WBC 7.2 10/01/2023 0833   RBC 5.20 11/12/2023 1629   RBC 4.72 10/01/2023 0833   HGB 14.2  11/12/2023 1629   HCT 43.8 11/12/2023 1629   PLT 522 (H) 11/12/2023 1629   MCV 84 11/12/2023 1629   MCH 27.3 11/12/2023 1629   MCH 27.3 10/01/2023 0833   MCHC 32.4 11/12/2023 1629   MCHC 32.5 10/01/2023 0833   RDW 14.6 11/12/2023 1629   LYMPHSABS 1.6 11/12/2023 1629   MONOABS 0.6 07/24/2018 0238   EOSABS 0.2 11/12/2023 1629   BASOSABS 0.1 11/12/2023 1629    CMP     Component Value Date/Time   NA 142 11/12/2023 1629   K 4.2 11/12/2023 1629   CL 102 11/12/2023 1629   CO2 20 11/12/2023 1629   GLUCOSE 92 11/12/2023 1629   GLUCOSE 123 (H) 10/01/2023 0833   BUN 11 11/12/2023 1629   CREATININE 0.71 11/12/2023 1629   CALCIUM 9.8 11/12/2023 1629   PROT 7.3 11/12/2023 1629   ALBUMIN 4.5 11/12/2023 1629   AST 16 11/12/2023 1629   ALT 27 11/12/2023 1629   ALKPHOS 61 11/12/2023 1629   BILITOT 0.6 11/12/2023 1629   GFRNONAA >60 10/01/2023 0833   GFRAA 130 01/12/2019 1423     Assessment/Plan:   Assessment & Plan  Epigastric pain Nausea Early satiety potentially exacerbated by Mounjaro  and recent doxycycline  as symptoms began after these medications..  Pantoprazole  ineffective.  No associated heartburn.  Negative RUQ ultrasound, CT, CBC, CMP, lipase.  Suspect gastroparesis in the setting of diabetes and recent Mounjaro  use. Multiple comorbidites including morbid obesity (BMI > 50) and chronic opioid use - Order gastric emptying study. - Provide information on gastroparesis and dietary recommendations. - Order stool study to rule out H. pylori - Consider endoscopy if gastric emptying study is negative (will have to be done  at the hospital with BMI of 56).  Chronic opioid-induced constipation Chronic constipation likely exacerbated by narcotic use and recent mounjaro .  Previously failed MiraLAX , fiber, Colace. Recent TSH normal. - Linzess 145mcg samples provided - increase water, exercise and fiber - patient will call us  in 8 days, if linzess works well we can  prescribe.  Diabetes mellitus Diabetes with suboptimal glycemic control, recent A1c of 8. Previously on Mounjaro , now on glipizide .  Assigned to Dr. Elvin Hammer this morning  Suzanna Erp, [place]  [facility] 11/18/2023, 8:47 AM  Cc: de [place], Raymond J, MD

## 2023-11-18 NOTE — Progress Notes (Signed)
 Reviewed. Complaints almost certainly medication related. Hopefully her early satiety will lead to weight loss. Avoid procedures in this high risk patient unless absolutely necessary. Thanks

## 2023-11-19 ENCOUNTER — Other Ambulatory Visit (HOSPITAL_BASED_OUTPATIENT_CLINIC_OR_DEPARTMENT_OTHER): Payer: Self-pay

## 2023-11-19 ENCOUNTER — Encounter (HOSPITAL_BASED_OUTPATIENT_CLINIC_OR_DEPARTMENT_OTHER): Payer: Self-pay | Admitting: Physical Therapy

## 2023-11-19 ENCOUNTER — Ambulatory Visit (HOSPITAL_BASED_OUTPATIENT_CLINIC_OR_DEPARTMENT_OTHER): Admitting: Physical Therapy

## 2023-11-19 DIAGNOSIS — M5459 Other low back pain: Secondary | ICD-10-CM

## 2023-11-19 DIAGNOSIS — R2689 Other abnormalities of gait and mobility: Secondary | ICD-10-CM

## 2023-11-19 DIAGNOSIS — R29898 Other symptoms and signs involving the musculoskeletal system: Secondary | ICD-10-CM

## 2023-11-19 DIAGNOSIS — M25561 Pain in right knee: Secondary | ICD-10-CM

## 2023-11-19 DIAGNOSIS — M6281 Muscle weakness (generalized): Secondary | ICD-10-CM

## 2023-11-19 MED ORDER — IRBESARTAN-HYDROCHLOROTHIAZIDE 300-12.5 MG PO TABS
1.0000 | ORAL_TABLET | Freq: Every day | ORAL | 1 refills | Status: DC
  Filled 2023-11-19: qty 90, 90d supply, fill #0
  Filled 2024-02-28: qty 90, 90d supply, fill #1

## 2023-11-19 MED ORDER — GLIPIZIDE ER 10 MG PO TB24
10.0000 mg | ORAL_TABLET | Freq: Every day | ORAL | 1 refills | Status: DC
  Filled 2023-11-19: qty 90, 90d supply, fill #0

## 2023-11-19 MED ORDER — HYDROCHLOROTHIAZIDE 12.5 MG PO CAPS
12.5000 mg | ORAL_CAPSULE | Freq: Every day | ORAL | 1 refills | Status: DC
  Filled 2023-11-19: qty 90, 90d supply, fill #0
  Filled 2024-02-28: qty 90, 90d supply, fill #1

## 2023-11-19 MED ORDER — NIFEDIPINE ER OSMOTIC RELEASE 60 MG PO TB24
60.0000 mg | ORAL_TABLET | Freq: Every day | ORAL | 1 refills | Status: DC
  Filled 2023-11-19: qty 90, 90d supply, fill #0
  Filled 2024-02-28: qty 90, 90d supply, fill #1

## 2023-11-19 NOTE — Therapy (Signed)
 OUTPATIENT PHYSICAL THERAPY THORACOLUMBAR TREATMENT  Patient Name: Molly Archer MRN: [id number] DOB:January 01, 1981, 43 y.o., female Today's Date: 11/19/2023  END OF SESSION:  PT End of Session - 11/19/23 1510     Visit Number 14    Number of Visits 20    Date for PT Re-Evaluation 11/21/23    Authorization Type [place] [employer]ICA[employer]    Authorization Time Period 6 visits approved from 10/18/2023 - 12/16/2023    PT Start Time 1511    PT Stop Time 1550    PT Time Calculation (min) 39 min    Activity Tolerance Patient tolerated treatment well    Behavior During Therapy [facility] for tasks assessed/performed             Past Medical History:  Diagnosis Date   Anemia    Asthma    only uses inhaler in fall and spring   Constipation    GERD (gastroesophageal reflux disease)    diet controlled, occ. uses omeprazole    Headache    otc med prn   History of kidney stones    Hx of Bell's palsy June 2013   Hypertension    currently no meds   Menometrorrhagia    Prediabetes    Psoriasis    Shortness of breath dyspnea    with exercise/exertion   Vitamin D  deficiency    Past Surgical History:  Procedure Laterality Date   COMBINED HYSTEROSCOPY DIAGNOSTIC / D&C N/A 2011   Benign secretory endometrium   CYSTOSCOPY WITH STENT PLACEMENT Left 10/19/2014   Procedure: CYSTOSCOPY WITH STENT PLACEMENT;  Surgeon: Andrez Banker, MD;  Location: [facility];  Service: Urology;  Laterality: Left;   DILATION AND CURETTAGE OF UTERUS  10/2003   Polyp, mild atypia, simple and complex hyperplasia,   DILATION AND CURETTAGE OF UTERUS  11/2003   Simple and complex hyperplasia   DILATION AND CURETTAGE OF UTERUS  2011   DILATION AND CURETTAGE OF UTERUS N/A 12/14/2014   Procedure: DILATATION AND CURETTAGE;  Surgeon: Verlyn Goad, MD;  Location: [facility];  Service: Gynecology;  Laterality: N/A;   HOLMIUM LASER APPLICATION Left 10/19/2014   Procedure: HOLMIUM LASER APPLICATION;  Surgeon: Andrez Banker, MD;  Location: [facility];  Service: Urology;  Laterality: Left;   HYSTEROSCOPY WITH D & C N/A 07/21/2018   Procedure: DILATATION AND CURETTAGE /HYSTEROSCOPY;  Surgeon: Tresia Fruit, MD;  Location: [facility];  Service: Gynecology;  Laterality: N/A;   INTRAUTERINE DEVICE (IUD) INSERTION N/A 12/14/2014   Procedure: INTRAUTERINE DEVICE (IUD) INSERTION;  Surgeon: Verlyn Goad, MD;  Location: [facility];  Service: Gynecology;  Laterality: N/A;   STONE EXTRACTION WITH BASKET Left 10/19/2014   Procedure: STONE EXTRACTION WITH BASKET;  Surgeon: Andrez Banker, MD;  Location: [facility];  Service: Urology;  Laterality: Left;   WISDOM TOOTH EXTRACTION     Patient Active Problem List   Diagnosis Date Noted   Constipation 09/25/2023   Sinusitis 09/09/2023   Early awakening 08/26/2023   Lumbar radiculopathy 07/17/2023   Acute cough 06/25/2023   Strep throat 06/18/2023   Adjustment disorder with anxious mood 05/16/2023   Primary osteoarthritis of right knee 05/15/2023   Right hip pain 05/15/2023   Spondylosis of lumbar spine 05/15/2023   Cervical spondylolysis 04/16/2023   Cervical sprain 04/16/2023   Encounter for screening for other metabolic disorders 04/16/2023   Lumbar sprain 04/16/2023   Left wrist sprain 04/16/2023   Haglund's deformity of left heel 04/16/2023   Derangement of  left knee 04/16/2023   Vaginal discharge 04/16/2023   Intracranial hypertension 03/04/2023   Diabetes mellitus (HCC) 03/04/2023   Phase of life problem 03/04/2023   Primary insomnia 09/19/2022   Migraine without aura and without status migrainosus, not intractable 05/02/2020   Complex tear of lateral meniscus of left knee as current injury 12/22/2019   Iron overload 12/21/2019   Medication management contract signed 12/09/2019   Primary osteoarthritis of left knee 11/17/2019   Patellofemoral pain syndrome of left knee 09/08/2019   Dermatitis 12/18/2018   Vitamin D  deficiency 09/25/2018   Absolute anemia  09/25/2018   Class 3 severe obesity with serious comorbidity and body mass index (BMI) of 50.0 to 59.9 in adult [facility]) 09/25/2018   IUD strings lost 09/24/2018   Thickened endometrium 07/20/2018   History of endometrial hyperplasia 07/20/2018   Morbid obesity with BMI of 50.0-59.9, adult (HCC) 07/20/2018   Abnormal uterine bleeding (AUB)    Hypertension 02/25/2012   Hx of Bell's palsy 02/25/2012    PCP: Raymond de Peru MD  REFERRING PROVIDER: Rawland Caddy, MD  REFERRING DIAG: [phone] (ICD-10-CM) - Spondylosis of lumbar spine M54.16 (ICD-10-CM) - Lumbar radiculopathy Diagnosis: Chronic low back pain and radiculopathy RLE, L4   Rx: Eval and Treat. McKenzie technique, low back and core strengthening and lengthening  Rationale for Evaluation and Treatment: Rehabilitation  THERAPY DIAG:  Right knee pain, unspecified chronicity  Other low back pain  Other abnormalities of gait and mobility  Muscle weakness (generalized)  Other symptoms and signs involving the musculoskeletal system  ONSET DATE: 3 years  SUBJECTIVE:                                                                                                                                                                                           SUBJECTIVE STATEMENT: Pt reports she did think it was getting better. She now feels like she has different pains going on. L heel hurting. Both knees have been sore. Knee pain worse after waking up in the morning and after extended periods sitting.   PERTINENT HISTORY:  Increased BMI, chronic pain, HTN, history of Bell's palsy, anemia, headaches, dyspnea with exertion   PAIN:  Are you having pain? yes: NPRS scale: 7/10 Head,  knees, back, and L foot  Pain location: see above Pain description: sharp/ ache Aggravating factors: standing, walking, driving Relieving factors: sitting in recliner, ice  PRECAUTIONS: None  WEIGHT BEARING RESTRICTIONS: No  FALLS:  Has patient  fallen in last 6 months? 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 Redemonstrated right foraminal disc protrusion at L4-L5 which likely contacts the exiting right L4 nerve root, unchanged. 2. Moderate bilateral facet degenerative change at L3-L4 and L5-S1, unchanged.  MRI 06/06/23 R knee IMPRESSION: 1. Tricompartmental osteoarthritis, with substantial lateral marginal spurring of the patella. 2. Moderate knee effusion. 3. Thickened distal iliotibial band but without surrounding edema to further suggest iliotibial band syndrome.  PATIENT SURVEYS:  LEFS 14/80   =18%  10/15/23 =33/80, 41%   SCREENING FOR RED FLAGS: Bowel or bladder incontinence: No Spinal tumors: No Cauda equina syndrome: No Compression fracture: No Abdominal aneurysm: No   COGNITION: Overall cognitive status: Within functional limits for tasks assessed                          SENSATION: WFL   POSTURE: rounded shoulders and forward head   PALPATION: TTP R glutes/piriformis, R knee at joint line   LUMBAR ROM:    AROM eval  Flexion 0% limited   Extension 25% limited  Right lateral flexion 25% limited  Left lateral flexion 25% limited  Right rotation    Left rotation     (Blank rows = not tested) * = pain/symptoms   LOWER EXTREMITY ROM:   WFL for tasks assessed   Active  Right eval Left eval  Hip flexion      Hip extension      Hip abduction      Hip adduction      Hip internal rotation      Hip external rotation      Knee flexion      Knee extension      Ankle dorsiflexion      Ankle plantarflexion      Ankle inversion      Ankle eversion       (Blank rows = not tested) * = pain/symptoms   LOWER EXTREMITY MMT:     MMT Right eval Left eval  Right 3/20 Left 3/20  Hip flexion 42.8 47.9 41.8 42.6  Hip extension        Hip abduction 56.4 65.1    Hip adduction        Hip internal rotation        Hip external rotation        Knee flexion 4- (seated) 4+ (seated) 5/5 seated 5/5 seated  Knee extension 46.2 76.6 41.4   Ankle dorsiflexion        Ankle plantarflexion        Ankle inversion        Ankle eversion         (Blank rows = not tested) * = pain/symptoms       FUNCTIONAL TESTS:  5 times sit to stand:  Initial/Current:  24. 46  seconds with some R UE support on chair /  21.85 sec without UE support 2 minute walk test: 328 feet    TODAY'S TREATMENT:   11/19/23 Nustep level 4 5 minutes for dynamic warm up and conditioning Standing hip abduction RTB at ankles 2 x 10  Standing hip extension RTB at ankles 2 x 10  Step up 4 inch 2 x 10  Hip hinge 1 x 10 - discontinued today due to poor mechanics.  Mini squat 1 x 10    [facility] Adult PT Treatment:  DATE: 11/05/23 Therapeutic Exercise: Nustep L4 x 10 min Seated hip flexor stretch 2x30" Seated calf stretch with strap 2x30" LAQ red TB 2x10 Manual Therapy: IASTM with massage roller bilat gastroc/soleus IASTM with massage roller bilat quads Therapeutic Activity: Side step with red TB around thighs x10 but too much on low back Hip hinge into glute setting at bars 2x10 Standing hip abd red TB around thighs 2x10 Self Care: Discussed sleep habits and meditation as it pertains to decreasing pain levels and overall muscle tightness   [facility] Adult PT Treatment:                                                DATE: 10/22/23 Pt seen for aquatic therapy today.  Treatment took place in water 3.5-4.75 ft in depth at the [facility] pool. Temp of water was 91.  Pt entered/exited the pool via stairs  with bilat rail.   * walking forward / backward unsupported, with arm swing * side stepping with arm addct/ abdct  * marching forward /  backward painful * wide stance with arm horiz abdct/ addct and reciprocal arm swing with light resistance bells * seated in lift chair: cycling, hip abdct/ addct, flutter kick, LAQ (painful) * side step with arm addct/ abdct with light resistance bells (painful in L ankle) * L stretch at wall * wall push up/push off x 10 * TrA set with half hollow noodle pull down to thighs x 10 * back against wall:  3 way LE stretch with hollow noodle at ankle, (ITB, hamstring, adductor)       PATIENT EDUCATION:  Education details:  Exercise form, POC, rationale of exercises, relevant anatomy Person educated: Patient Education method: Explanation, Demonstration, verbal and tactile cues Education comprehension: verbalized understanding, returned demonstration, verbal cues required, and tactile cues required  HOME EXERCISE PROGRAM: Access Code: [email or web address] URL: [email or web address] Date: 11/05/2023 Prepared by: Gellen April Erman [place]  Exercises - Seated Hamstring Set  - 2-3 x daily - 7 x weekly - 10 reps - 5-10 second hold - Supine Transversus Abdominis Bracing - Hands on Stomach  - 2 x daily - 7 x weekly - 2 sets - 10 reps - Hooklying Clamshell with Resistance  - 1 x daily - 4-5 x weekly - 2 sets - 10 reps - Supine March  - 1 x daily - 7 x weekly - 2 sets - 10 reps - Supine Core Control with Leg Extension  - 1 x daily - 7 x weekly - 2 sets - 10 reps - Standing Shoulder Row with Anchored Resistance  - 1 x daily - 5 x weekly - 2 sets - 10 reps - Shoulder extension with resistance - Neutral  - 1 x daily - 4-5 x weekly - 2 sets - 10 reps - Seated Knee Extension with Resistance  - 1 x daily - 7 x weekly - 2 sets - 10 reps - 3 sec hold - Standing 'L' Stretch at Counter  - 1 x daily - 7 x weekly - 2 sets - 10 reps - Hip Abduction with Resistance Loop  - 1 x daily - 7 x weekly - 2 sets - 10 reps   ASSESSMENT:  CLINICAL IMPRESSION: Began session on nustep for dynamic warm up and  conditioning. Continued with LE strengthening and began additional functional strengthening. Patient will continue to  benefit from physical therapy in order to improve function and reduce impairment.      OBJECTIVE IMPAIRMENTS: Abnormal gait, decreased activity tolerance, decreased balance, decreased endurance, decreased mobility, difficulty walking, decreased ROM, decreased strength, increased muscle spasms, impaired flexibility, improper body mechanics, postural dysfunction, obesity, and pain.   ACTIVITY LIMITATIONS: carrying, lifting, bending, standing, squatting, stairs, transfers, reach over head, hygiene/grooming, locomotion level, and caring for others  PARTICIPATION LIMITATIONS: meal prep, cleaning, laundry, driving, shopping, community activity, occupation, and yard work  PERSONAL FACTORS: Fitness, Time since onset of injury/illness/exacerbation, and 3+ comorbidities: Increased BMI, chronic pain, HTN  are also affecting patient's functional outcome.   REHAB POTENTIAL: Good  CLINICAL DECISION MAKING: Stable/uncomplicated  EVALUATION COMPLEXITY: Low   GOALS: Goals reviewed with patient? Yes  SHORT TERM GOALS: Target date: 09/12/2023    Patient will be independent with HEP in order to improve functional outcomes. Baseline:  Goal status: GOAL MET  2.  Patient will report at least 25% improvement in symptoms for improved quality of life. Baseline:  Goal status: GOAL MET   LONG TERM GOALS: Target date: 11/21/2023    Patient will report at least 75% improvement in symptoms for improved quality of life. Baseline:  Goal status: 73% MET  2.  Patient will improve LEFS score by at least 18 points in order to indicate improved tolerance to activity. Baseline: 14/80, see above Goal status: MET - 10/15/23  3.  Patient will be able to complete 5x STS in under 14 seconds in order to demonstrate improved functional strength. Baseline: 24. 46 seconds with some R UE support on  chair Goal status: PROGRESSING  3/20  4.  Patient will be able to ambulate at least 400 feet in in order to demonstrate improved tolerance to activity. Baseline: 328 feet at eval;  319 ft on 10/15/23 Goal status: In progress   5.  Patient will demonstrate grade of 5/5 MMT or 10lb increase in force in all tested musculature as evidence of improved strength to assist with stair ambulation and gait.   Baseline: see above Goal status: partially met  3/20     PLAN:  PT FREQUENCY: 2x/wk  PT DURATION: 5 weeks  PLANNED INTERVENTIONS: 97164- PT Re-evaluation, 97110-Therapeutic exercises, 97530- Therapeutic activity, 97112- Neuromuscular re-education, 97535- Self Care, 16109- Manual therapy, [phone]- Gait training, [phone]- Orthotic Fit/training, [phone]- Canalith repositioning, [id number]- Aquatic Therapy, [phone]- Splinting, Patient/Family education, Balance training, Stair training, Taping, Dry Needling, Joint mobilization, Joint manipulation, Spinal manipulation, Spinal mobilization, Scar mobilization, and DME instructions.  PLAN FOR NEXT SESSION: core and postural strength, LE strength and progress as tolerated.  Cont with land and aquatic therapy.     Perfecto Bracket Navneet Schmuck, PT 11/19/23 3:52 PM [facility] [address] [place], [place], [address] Phone: [phone]   Fax:  [phone]  For all possible CPT codes, reference the Planned Interventions line above.                         Check all conditions that are expected to impact treatment: {Conditions expected to impact treatment:Morbid obesity and Respiratory disorders               If treatment provided at initial evaluation, no treatment charged due to lack of authorization.

## 2023-11-21 ENCOUNTER — Encounter (HOSPITAL_BASED_OUTPATIENT_CLINIC_OR_DEPARTMENT_OTHER): Payer: Self-pay | Admitting: Physical Therapy

## 2023-11-21 ENCOUNTER — Ambulatory Visit (HOSPITAL_BASED_OUTPATIENT_CLINIC_OR_DEPARTMENT_OTHER): Admitting: Physical Therapy

## 2023-11-21 DIAGNOSIS — R29898 Other symptoms and signs involving the musculoskeletal system: Secondary | ICD-10-CM

## 2023-11-21 DIAGNOSIS — M6281 Muscle weakness (generalized): Secondary | ICD-10-CM

## 2023-11-21 DIAGNOSIS — R2689 Other abnormalities of gait and mobility: Secondary | ICD-10-CM

## 2023-11-21 DIAGNOSIS — M25551 Pain in right hip: Secondary | ICD-10-CM

## 2023-11-21 DIAGNOSIS — M5459 Other low back pain: Secondary | ICD-10-CM

## 2023-11-21 DIAGNOSIS — M25561 Pain in right knee: Secondary | ICD-10-CM

## 2023-11-21 NOTE — Therapy (Signed)
 OUTPATIENT PHYSICAL THERAPY THORACOLUMBAR TREATMENT  Patient Name: Molly Archer MRN: [id number] DOB:1981-06-29, 43 y.o., female Today's Date: 11/21/2023  Progress Note   Reporting Period 10/17/23 to 11/21/23   See note below for Objective Data and Assessment of Progress/Goals   END OF SESSION:  PT End of Session - 11/21/23 1513     Visit Number 15    Number of Visits 32    Date for PT Re-Evaluation 01/02/24    Authorization Type [place] MEDICAID [employer]    Authorization Time Period 6 visits approved from 10/18/2023 - 12/16/2023    PT Start Time 1513    PT Stop Time 1555    PT Time Calculation (min) 42 min    Activity Tolerance Patient tolerated treatment well    Behavior During Therapy [facility] for tasks assessed/performed             Past Medical History:  Diagnosis Date   Anemia    Asthma    only uses inhaler in fall and spring   Constipation    GERD (gastroesophageal reflux disease)    diet controlled, occ. uses omeprazole    Headache    otc med prn   History of kidney stones    Hx of Bell's palsy June 2013   Hypertension    currently no meds   Menometrorrhagia    Prediabetes    Psoriasis    Shortness of breath dyspnea    with exercise/exertion   Vitamin D  deficiency    Past Surgical History:  Procedure Laterality Date   COMBINED HYSTEROSCOPY DIAGNOSTIC / D&C N/A 2011   Benign secretory endometrium   CYSTOSCOPY WITH STENT PLACEMENT Left 10/19/2014   Procedure: CYSTOSCOPY WITH STENT PLACEMENT;  Surgeon: Andrez Banker, MD;  Location: [facility];  Service: Urology;  Laterality: Left;   DILATION AND CURETTAGE OF UTERUS  10/2003   Polyp, mild atypia, simple and complex hyperplasia,   DILATION AND CURETTAGE OF UTERUS  11/2003   Simple and complex hyperplasia   DILATION AND CURETTAGE OF UTERUS  2011   DILATION AND CURETTAGE OF UTERUS N/A 12/14/2014   Procedure: DILATATION AND CURETTAGE;  Surgeon: Verlyn Goad, MD;  Location: [facility];  Service:  Gynecology;  Laterality: N/A;   HOLMIUM LASER APPLICATION Left 10/19/2014   Procedure: HOLMIUM LASER APPLICATION;  Surgeon: Andrez Banker, MD;  Location: [facility];  Service: Urology;  Laterality: Left;   HYSTEROSCOPY WITH D & C N/A 07/21/2018   Procedure: DILATATION AND CURETTAGE /HYSTEROSCOPY;  Surgeon: Tresia Fruit, MD;  Location: [facility];  Service: Gynecology;  Laterality: N/A;   INTRAUTERINE DEVICE (IUD) INSERTION N/A 12/14/2014   Procedure: INTRAUTERINE DEVICE (IUD) INSERTION;  Surgeon: Verlyn Goad, MD;  Location: [facility];  Service: Gynecology;  Laterality: N/A;   STONE EXTRACTION WITH BASKET Left 10/19/2014   Procedure: STONE EXTRACTION WITH BASKET;  Surgeon: Andrez Banker, MD;  Location: [facility];  Service: Urology;  Laterality: Left;   WISDOM TOOTH EXTRACTION     Patient Active Problem List   Diagnosis Date Noted   Constipation 09/25/2023   Sinusitis 09/09/2023   Early awakening 08/26/2023   Lumbar radiculopathy 07/17/2023   Acute cough 06/25/2023   Strep throat 06/18/2023   Adjustment disorder with anxious mood 05/16/2023   Primary osteoarthritis of right knee 05/15/2023   Right hip pain 05/15/2023   Spondylosis of lumbar spine 05/15/2023   Cervical spondylolysis 04/16/2023   Cervical sprain 04/16/2023   Encounter for screening for other metabolic disorders 04/16/2023  Lumbar sprain 04/16/2023   Left wrist sprain 04/16/2023   Haglund's deformity of left heel 04/16/2023   Derangement of left knee 04/16/2023   Vaginal discharge 04/16/2023   Intracranial hypertension 03/04/2023   Diabetes mellitus (HCC) 03/04/2023   Phase of life problem 03/04/2023   Primary insomnia 09/19/2022   Migraine without aura and without status migrainosus, not intractable 05/02/2020   Complex tear of lateral meniscus of left knee as current injury 12/22/2019   Iron overload 12/21/2019   Medication management contract signed 12/09/2019   Primary osteoarthritis of left knee 11/17/2019    Patellofemoral pain syndrome of left knee 09/08/2019   Dermatitis 12/18/2018   Vitamin D  deficiency 09/25/2018   Absolute anemia 09/25/2018   Class 3 severe obesity with serious comorbidity and body mass index (BMI) of 50.0 to 59.9 in adult [facility]) 09/25/2018   IUD strings lost 09/24/2018   Thickened endometrium 07/20/2018   History of endometrial hyperplasia 07/20/2018   Morbid obesity with BMI of 50.0-59.9, adult (HCC) 07/20/2018   Abnormal uterine bleeding (AUB)    Hypertension 02/25/2012   Hx of Bell's palsy 02/25/2012    PCP: Raymond de Peru MD  REFERRING PROVIDER: Rawland Caddy, MD  REFERRING DIAG: [phone] (ICD-10-CM) - Spondylosis of lumbar spine M54.16 (ICD-10-CM) - Lumbar radiculopathy Diagnosis: Chronic low back pain and radiculopathy RLE, L4   Rx: Eval and Treat. McKenzie technique, low back and core strengthening and lengthening  Rationale for Evaluation and Treatment: Rehabilitation  THERAPY DIAG:  Right knee pain, unspecified chronicity  Other low back pain  Other abnormalities of gait and mobility  Muscle weakness (generalized)  Other symptoms and signs involving the musculoskeletal system  Pain in right hip  ONSET DATE: 3 years  SUBJECTIVE:                                                                                                                                                                                           SUBJECTIVE STATEMENT: Pt reports sore in L knee down to foot. Walking better and a little bit more often. Walking faster once she gets going. Not sure if PT is helping or not. States 50% improvement/functional level.   PERTINENT HISTORY:  Increased BMI, chronic pain, HTN, history of Bell's palsy, anemia, headaches, dyspnea with exertion   PAIN:  Are you having pain? yes: NPRS scale: 7/10 Head,  knees, back, and L foot  Pain location: see above Pain description: sharp/ ache Aggravating factors: standing, walking,  driving Relieving factors: sitting in recliner, ice  PRECAUTIONS: None  WEIGHT BEARING RESTRICTIONS: No  FALLS:  Has patient fallen in last 6 months? 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 Redemonstrated right foraminal disc protrusion at L4-L5 which likely contacts the exiting right L4 nerve root, unchanged. 2. Moderate bilateral facet degenerative change at L3-L4 and L5-S1, unchanged.  MRI 06/06/23 R knee IMPRESSION: 1. Tricompartmental osteoarthritis, with substantial lateral marginal spurring of the patella. 2. Moderate knee effusion. 3. Thickened distal iliotibial band but without surrounding edema to further suggest iliotibial band syndrome.  PATIENT SURVEYS:  LEFS 14/80   =18%  10/15/23 =33/80, 41%  11/21/23: 21/80   SCREENING FOR RED FLAGS: Bowel or bladder incontinence: No Spinal tumors: No Cauda equina syndrome: No Compression fracture: No Abdominal aneurysm: No   COGNITION: Overall cognitive status: Within functional limits for tasks assessed                          SENSATION: WFL   POSTURE: rounded shoulders and forward head   PALPATION: TTP R glutes/piriformis, R knee at joint line   LUMBAR ROM:    AROM eval  Flexion 0% limited   Extension 25% limited  Right lateral flexion 25% limited  Left lateral flexion 25% limited  Right rotation    Left rotation     (Blank rows = not tested) * = pain/symptoms   LOWER EXTREMITY ROM:   WFL for tasks assessed   Active  Right eval Left eval  Hip flexion      Hip extension      Hip abduction      Hip adduction      Hip internal rotation      Hip external rotation      Knee flexion      Knee extension      Ankle dorsiflexion      Ankle plantarflexion       Ankle inversion      Ankle eversion       (Blank rows = not tested) * = pain/symptoms   LOWER EXTREMITY MMT:     MMT Right eval Left eval Right 3/20 Left 3/20 Right 11/21/23 Left 11/21/23  Hip flexion 42.8 47.9 41.8 42.6 52.7 47.1  Hip extension          Hip abduction 56.4 65.1   79.5 78.1  Hip adduction          Hip internal rotation          Hip external rotation          Knee flexion 4- (seated) 4+ (seated) 5/5 seated 5/5 seated 5/5 seated 5/5 seated  Knee extension 46.2 76.6 41.4  81 83.4  Ankle dorsiflexion          Ankle plantarflexion          Ankle inversion          Ankle eversion           (Blank rows = not tested) * = pain/symptoms       FUNCTIONAL TESTS:  5 times sit to stand:  Initial/Current:  24. 46  seconds with some R UE support on chair /  21.85 sec without UE support 2 minute walk test: 328 feet Reassessment 11/21/23  FUNCTIONAL TESTS:  5 times sit to stand:  11/21/23: 17.47 seconds without UE support 2 minute walk test: 11/21/23: 382 feet  TODAY'S TREATMENT:   11/21/23 Nustep level 4 5 minutes for dynamic warm up and conditioning Reassessment   11/19/23 Nustep level 4 5 minutes for dynamic warm up and conditioning Standing hip abduction RTB at ankles 2 x 10  Standing hip extension RTB at ankles 2 x 10  Step up 4 inch 2 x 10  Hip hinge 1 x 10 - discontinued today due to poor mechanics.  Mini squat 1 x 10    [facility] Adult PT Treatment:                                                DATE: 11/05/23 Therapeutic Exercise: Nustep L4 x 10 min Seated hip flexor stretch 2x30" Seated calf stretch with strap 2x30" LAQ red TB 2x10 Manual Therapy: IASTM with massage roller bilat gastroc/soleus IASTM with massage roller bilat quads Therapeutic Activity: Side step with red TB around thighs x10 but too much on low back Hip hinge into glute setting at bars 2x10 Standing hip abd red TB around thighs 2x10 Self Care: Discussed sleep habits and meditation as it  pertains to decreasing pain levels and overall muscle tightness   [facility] Adult PT Treatment:                                                DATE: 10/22/23 Pt seen for aquatic therapy today.  Treatment took place in water 3.5-4.75 ft in depth at the [facility] pool. Temp of water was 91.  Pt entered/exited the pool via stairs  with bilat rail.   * walking forward / backward unsupported, with arm swing * side stepping with arm addct/ abdct  * marching forward / backward painful * wide stance with arm horiz abdct/ addct and reciprocal arm swing with light resistance bells * seated in lift chair: cycling, hip abdct/ addct, flutter kick, LAQ (painful) * side step with arm addct/ abdct with light resistance bells (painful in L ankle) * L stretch at wall * wall push up/push off x 10 * TrA set with half hollow noodle pull down to thighs x 10 * back against wall:  3 way LE stretch with hollow noodle at ankle, (ITB, hamstring, adductor)       PATIENT EDUCATION:  Education details:  Exercise form, POC, rationale of exercises, relevant anatomy Person educated: Patient Education method: Explanation, Demonstration, verbal and tactile cues Education comprehension: verbalized understanding, returned demonstration, verbal cues required, and tactile cues required  HOME EXERCISE PROGRAM: Access Code: [email or web address] URL: [email or web address] Date: 11/05/2023 Prepared by: Gellen April Erman [place]  Exercises - Seated Hamstring Set  - 2-3 x daily - 7 x weekly - 10 reps - 5-10 second hold - Supine Transversus Abdominis Bracing - Hands on Stomach  - 2 x daily - 7 x weekly - 2 sets - 10 reps - Hooklying Clamshell with Resistance  - 1 x daily - 4-5 x weekly - 2 sets - 10 reps - Supine March  - 1 x daily - 7 x weekly - 2 sets - 10 reps - Supine Core Control with Leg Extension  - 1 x daily - 7 x weekly - 2 sets - 10 reps - Standing Shoulder Row with Anchored Resistance  - 1  x daily - 5 x  weekly - 2 sets - 10 reps - Shoulder extension with resistance - Neutral  - 1 x daily - 4-5 x weekly - 2 sets - 10 reps - Seated Knee Extension with Resistance  - 1 x daily - 7 x weekly - 2 sets - 10 reps - 3 sec hold - Standing 'L' Stretch at Counter  - 1 x daily - 7 x weekly - 2 sets - 10 reps - Hip Abduction with Resistance Loop  - 1 x daily - 7 x weekly - 2 sets - 10 reps   ASSESSMENT:  CLINICAL IMPRESSION: Began session on nustep for dynamic warm up and conditioning. Patient has met 2/2 short term goals and 3/5 long term goals with ability to complete HEP and improvement in symptoms, strength, activity tolerance, gait, balance, and functional mobility. Remaining goals not met due to continued deficits in symptoms, strength activity tolerance. Patient has made good progress toward remaining goals. Patient will continue to benefit from skilled physical therapy in order to improve function and reduce impairment.        OBJECTIVE IMPAIRMENTS: Abnormal gait, decreased activity tolerance, decreased balance, decreased endurance, decreased mobility, difficulty walking, decreased ROM, decreased strength, increased muscle spasms, impaired flexibility, improper body mechanics, postural dysfunction, obesity, and pain.   ACTIVITY LIMITATIONS: carrying, lifting, bending, standing, squatting, stairs, transfers, reach over head, hygiene/grooming, locomotion level, and caring for others  PARTICIPATION LIMITATIONS: meal prep, cleaning, laundry, driving, shopping, community activity, occupation, and yard work  PERSONAL FACTORS: Fitness, Time since onset of injury/illness/exacerbation, and 3+ comorbidities: Increased BMI, chronic pain, HTN  are also affecting patient's functional outcome.   REHAB POTENTIAL: Good  CLINICAL DECISION MAKING: Stable/uncomplicated  EVALUATION COMPLEXITY: Low   GOALS: Goals reviewed with patient? Yes  SHORT TERM GOALS: Target date: 09/12/2023    Patient will be  independent with HEP in order to improve functional outcomes. Baseline:  Goal status: GOAL MET  2.  Patient will report at least 25% improvement in symptoms for improved quality of life. Baseline:  Goal status: GOAL MET   LONG TERM GOALS: Target date: 11/21/2023    Patient will report at least 75% improvement in symptoms for improved quality of life. Baseline:  Goal status: 73% MET  2.  Patient will improve LEFS score by at least 18 points in order to indicate improved tolerance to activity. Baseline: 14/80, see above Goal status: MET - 10/15/23  3.  Patient will be able to complete 5x STS in under 14 seconds in order to demonstrate improved functional strength. Baseline: 24. 46 seconds with some R UE support on chair 11/21/23 17.47 seconds without UE support Goal status: PROGRESSING  3/20  4.  Patient will be able to ambulate at least 400 feet in in order to demonstrate improved tolerance to activity. Baseline: 328 feet at eval;  319 ft on 10/15/23 11/21/23: 382 feet Goal status: In progress   5.  Patient will demonstrate grade of 5/5 MMT or 10lb increase in force in all tested musculature as evidence of improved strength to assist with stair ambulation and gait.   Baseline: see above Goal status: partially met  3/20     PLAN:  PT FREQUENCY: 1-2x/wk  PT DURATION: 6 weeks  PLANNED INTERVENTIONS: 97164- PT Re-evaluation, 97110-Therapeutic exercises, 97530- Therapeutic activity, 97112- Neuromuscular re-education, 97535- Self Care, 40981- Manual therapy, [phone]- Gait training, [phone]- Orthotic Fit/training, [phone]- Canalith repositioning, [id number]- Aquatic Therapy, [phone]- Splinting, Patient/Family education, Balance training, Stair training,  Taping, Dry Needling, Joint mobilization, Joint manipulation, Spinal manipulation, Spinal mobilization, Scar mobilization, and DME instructions.  PLAN FOR NEXT SESSION: core and postural strength, LE strength and progress as tolerated.  Cont  with land and aquatic therapy.     Perfecto Bracket Keenya Matera, PT 11/21/23 3:58 PM [facility] [address] [place], [place], [address] Phone: [phone]   Fax:  [phone]  For all possible CPT codes, reference the Planned Interventions line above.                         Check all conditions that are expected to impact treatment: {Conditions expected to impact treatment:Morbid obesity and Respiratory disorders               If treatment provided at initial evaluation, no treatment charged due to lack of authorization.

## 2023-11-23 ENCOUNTER — Encounter (HOSPITAL_BASED_OUTPATIENT_CLINIC_OR_DEPARTMENT_OTHER): Payer: Self-pay | Admitting: Emergency Medicine

## 2023-11-23 ENCOUNTER — Emergency Department (HOSPITAL_BASED_OUTPATIENT_CLINIC_OR_DEPARTMENT_OTHER)
Admission: EM | Admit: 2023-11-23 | Discharge: 2023-11-23 | Disposition: A | Discharge status: Home / Self Care | Attending: Emergency Medicine | Admitting: Emergency Medicine

## 2023-11-23 ENCOUNTER — Other Ambulatory Visit: Payer: Self-pay

## 2023-11-23 DIAGNOSIS — R109 Unspecified abdominal pain: Secondary | ICD-10-CM

## 2023-11-23 DIAGNOSIS — R1033 Periumbilical pain: Secondary | ICD-10-CM | POA: Insufficient documentation

## 2023-11-23 DIAGNOSIS — R112 Nausea with vomiting, unspecified: Secondary | ICD-10-CM | POA: Diagnosis present

## 2023-11-23 LAB — URINALYSIS, ROUTINE W REFLEX MICROSCOPIC
Bacteria, UA: NONE SEEN
Bilirubin Urine: NEGATIVE
Glucose, UA: NEGATIVE mg/dL
Hgb urine dipstick: NEGATIVE
Ketones, ur: NEGATIVE mg/dL
Nitrite: NEGATIVE
Specific Gravity, Urine: 1.021 (ref 1.005–1.030)
pH: 6 (ref 5.0–8.0)

## 2023-11-23 LAB — COMPREHENSIVE METABOLIC PANEL WITH GFR
ALT: 31 U/L (ref 0–44)
AST: 21 U/L (ref 15–41)
Albumin: 4.7 g/dL (ref 3.5–5.0)
Alkaline Phosphatase: 53 U/L (ref 38–126)
Anion gap: 13 (ref 5–15)
BUN: 11 mg/dL (ref 6–20)
CO2: 21 mmol/L — ABNORMAL LOW (ref 22–32)
Calcium: 10.1 mg/dL (ref 8.9–10.3)
Chloride: 104 mmol/L (ref 98–111)
Creatinine, Ser: 0.84 mg/dL (ref 0.44–1.00)
GFR, Estimated: >60 mL/min (ref >60)
Glucose, Bld: 155 mg/dL — ABNORMAL HIGH (ref 70–99)
Potassium: 3.9 mmol/L (ref 3.5–5.1)
Sodium: 138 mmol/L (ref 135–145)
Total Bilirubin: 0.5 mg/dL (ref 0.0–1.2)
Total Protein: 7.4 g/dL (ref 6.5–8.1)

## 2023-11-23 LAB — CBC WITH DIFFERENTIAL/PLATELET
Abs Immature Granulocytes: 0.02 10*3/uL (ref 0.00–0.07)
Basophils Absolute: 0 10*3/uL (ref 0.0–0.1)
Basophils Relative: 1 %
Eosinophils Absolute: 0.2 10*3/uL (ref 0.0–0.5)
Eosinophils Relative: 4 %
HCT: 39.5 % (ref 36.0–46.0)
Hemoglobin: 13 g/dL (ref 12.0–15.0)
Immature Granulocytes: 0 %
Lymphocytes Relative: 13 %
Lymphs Abs: 0.7 10*3/uL (ref 0.7–4.0)
MCH: 27.6 pg (ref 26.0–34.0)
MCHC: 32.9 g/dL (ref 30.0–36.0)
MCV: 83.9 fL (ref 80.0–100.0)
Monocytes Absolute: 0.5 10*3/uL (ref 0.1–1.0)
Monocytes Relative: 9 %
Neutro Abs: 3.9 10*3/uL (ref 1.7–7.7)
Neutrophils Relative %: 73 %
Platelets: 451 10*3/uL — ABNORMAL HIGH (ref 150–400)
RBC: 4.71 MIL/uL (ref 3.87–5.11)
RDW: 14.8 % (ref 11.5–15.5)
WBC: 5.4 10*3/uL (ref 4.0–10.5)
nRBC: 0 % (ref 0.0–0.2)

## 2023-11-23 LAB — LIPASE, BLOOD: Lipase: 28 U/L (ref 11–51)

## 2023-11-23 LAB — PREGNANCY, URINE: Preg Test, Ur: NEGATIVE

## 2023-11-23 MED ORDER — METOCLOPRAMIDE HCL 5 MG/ML IJ SOLN
10.0000 mg | Freq: Once | INTRAMUSCULAR | Status: AC
  Administered 2023-11-23: 10 mg via INTRAVENOUS
  Filled 2023-11-23: qty 2

## 2023-11-23 MED ORDER — DIPHENHYDRAMINE HCL 50 MG/ML IJ SOLN
25.0000 mg | Freq: Once | INTRAMUSCULAR | Status: AC
  Administered 2023-11-23: 25 mg via INTRAVENOUS
  Filled 2023-11-23: qty 1

## 2023-11-23 MED ORDER — METOCLOPRAMIDE HCL 10 MG PO TABS: 10.0000 mg | ORAL_TABLET | Freq: Four times a day (QID) | ORAL | 0 refills | Status: DC

## 2023-11-23 MED ORDER — DICYCLOMINE HCL 20 MG PO TABS: 20.0000 mg | ORAL_TABLET | Freq: Two times a day (BID) | ORAL | 0 refills | Status: DC

## 2023-11-23 MED ORDER — PANTOPRAZOLE SODIUM 40 MG IV SOLR
40.0000 mg | Freq: Once | INTRAVENOUS | Status: AC
  Administered 2023-11-23: 40 mg via INTRAVENOUS
  Filled 2023-11-23: qty 10

## 2023-11-23 MED ORDER — SODIUM CHLORIDE 0.9 % IV BOLUS
1000.0000 mL | Freq: Once | INTRAVENOUS | Status: AC
  Administered 2023-11-23: 1000 mL via INTRAVENOUS

## 2023-11-23 MED ORDER — HYDROMORPHONE HCL 1 MG/ML IJ SOLN
0.5000 mg | Freq: Once | INTRAMUSCULAR | Status: AC
  Administered 2023-11-23: 0.5 mg via INTRAVENOUS
  Filled 2023-11-23: qty 1

## 2023-11-23 NOTE — ED Triage Notes (Signed)
 Pelvic to back  pain and epigastric since February. Saw Gi Monday ,gave her laxative and has a stomach test coming up ,but the pain is just worse and can't take it, nauseated/flush. Intermittent but has become constant. Lbm yesterday ,very hard.

## 2023-11-23 NOTE — ED Notes (Signed)
Patient tolerated PO fluids well

## 2023-11-23 NOTE — ED Provider Notes (Signed)
 [facility] [facility] Provider Note   CSN: [id number] Arrival date & time: 11/23/23  1208     History  Chief Complaint  Patient presents with   Abdominal Pain    Molly Archer is a 43 y.o. female.  Patient presents to the emergency department for evaluation of ongoing abdominal pain since February, nausea and vomiting described as regurgitation.  Patient states that the symptoms started after taking a couple of doses of doxycycline  for a respiratory infection.  She was also on Mounjaro  at the time of symptoms starting and PCP is recently taken her off of this.  Patient has a history of chronic pain and is on oxycodone  and diclofenac .  She was evaluated in the emergency department in March 2025.  She had right upper quadrant ultrasound, CT abdomen pelvis at that time which were negative.  Patient was trialed on a PPI which did not seem to help much.  She has followed up with her PCP and gastroenterology.  She is scheduled for a nuclear medicine gastric emptying study, to evaluate possibility of diabetic gastroparesis as cause of her symptoms.  Pain is described as burning in the lower abdomen at the current time but she also reports epigastric pain at times.  No recent fevers.  She has been constipated and at other times has had diarrhea.  No current blood in the stool.  No current urinary symptoms.  She presents today for symptoms uncontrolled at home, worse than usual symptoms, but same in character.       Home Medications Prior to Admission medications   Medication Sig Start Date End Date Taking? Authorizing Provider  albuterol  (VENTOLIN  HFA) 108 (90 Base) MCG/ACT inhaler Inhale 2 puffs into the lungs every 4 (four) hours as needed for wheezing or shortness of breath (cough, shortness of breath or wheezing.). 10/14/23   de [place], Alonza Jansky, MD  albuterol  (VENTOLIN  HFA) 108 [phone] Base) MCG/ACT inhaler Inhale 2 puffs into the lungs every 4 (four) hours as  needed for wheezing or shortness of breath (cough, shortness of breath or wheezing.). 10/14/23   de [place], Raymond J, MD  cetirizine (ZYRTEC) 10 MG tablet Take by mouth. 09/19/22   [provider]  Ciclopirox  1 % shampoo Apply 1 each topically at bedtime. 06/18/23   [place], Raymond J, MD  clobetasol  ointment (TEMOVATE ) 0.05 % Apply topically 2 (two) times daily. 09/16/23   [place], Alonza Jansky, MD  diclofenac  (VOLTAREN ) 75 MG EC tablet Take 1 tablet (75 mg total) by mouth 2 (two) times daily with a meal. 05/29/23   Rawland Caddy, MD  doxazosin  (CARDURA ) 1 MG tablet Take 1 tablet (1 mg total) by mouth daily. 08/20/23   CaudleArcola Kocher, FNP  fluocinolone  0.01 % cream Apply twice a week. 06/19/19   [provider]  Fluocinolone  Acetonide Scalp 0.01 % OIL Apply 1 Application topically 2 (two) times a week. 08/22/23   Caudle, Arcola Kocher, FNP  fluticasone  (FLONASE ) 50 MCG/ACT nasal spray Administer 1 spray in each nostril 2 times daily. 09/19/22   [provider]  glipiZIDE  (GLUCOTROL  XL) 10 MG 24 hr tablet Take 1 tablet (10 mg total) by mouth daily. 11/19/23   [place], Alonza Jansky, MD  glucose blood (PRECISION QID TEST) test strip Check glucose twice daily as needed. 09/19/22   [provider]  hydrochlorothiazide  (MICROZIDE ) 12.5 MG capsule Take 1 capsule (12.5 mg total) by mouth daily. 11/19/23   [place], Raymond  J, MD  ipratropium-albuterol  (DUONEB) 0.5-2.5 (3) MG/3ML SOLN Inhale 3 mLs into the lungs every 6 (six) hours as needed. 07/09/23   Wilhelmena Hanson, FNP  irbesartan -hydrochlorothiazide  (AVALIDE) 300-12.5 MG tablet Take 1 tablet by mouth daily. 11/19/23   [place], Raymond J, MD  Naftifine  HCl (NAFTIN ) 2 % CREA Apply liberally to affected area daily 04/16/23   [place], Alonza Jansky, MD  NIFEdipine  (PROCARDIA  XL/NIFEDICAL XL) 60 MG 24 hr tablet Take 1 tablet (60 mg total) by mouth daily. 11/19/23   [place], Raymond J, MD  norethindrone  (AYGESTIN ) 5 MG tablet Take 3  tablets (15 mg total) by mouth daily. 10/22/18   Anyanwu, Ugonna A, MD  nortriptyline  (PAMELOR ) 10 MG capsule Take 1-2 capsules (10-20 mg total) by mouth at bedtime. 07/17/23   Rawland Caddy, MD  ondansetron  (ZOFRAN -ODT) 4 MG disintegrating tablet Take 4 mg by mouth every 8 (eight) hours as needed. 09/14/23   [provider]  Oxycodone  HCl 10 MG TABS Take 1 tablet (10 mg total) by mouth every 8 (eight) hours as needed. 11/06/23   Rawland Caddy, MD  pantoprazole  (PROTONIX ) 40 MG tablet Take 1 tablet (40 mg total) by mouth daily. 10/01/23   Auston Blush, MD  tiZANidine  (ZANAFLEX ) 4 MG tablet Take 1 tablet (4 mg total) by mouth every 8 (eight) hours as needed for muscle spasms. 10/07/23   [place], Raymond J, MD  topiramate  (TOPAMAX ) 50 MG tablet Take 1/2 tablet at bedtime for one week, then increase to 1 tablet at bedtime 07/30/23   Merriam Abbey, DO  triamcinolone  cream (KENALOG ) 0.1 % Apply 1 Application topically 2 (two) times daily. 08/20/23   [place], Alonza Jansky, MD  zolpidem  (AMBIEN ) 5 MG tablet Take 1 tablet (5 mg total) by mouth at bedtime as needed for sleep. 10/07/23   [place], Raymond J, MD      Allergies    Losartan, Penicillins, and Tramadol     Review of Systems   Review of Systems  Physical Exam Updated Vital Signs BP (!) 140/81 (BP Location: Left Arm)   Pulse 75   Temp 98.8 F (37.1 C)   Resp 18   SpO2 100%  Physical Exam Vitals and nursing note reviewed.  Constitutional:      General: She is not in acute distress.    Appearance: She is well-developed.  HENT:     Head: Normocephalic and atraumatic.     Right Ear: External ear normal.     Left Ear: External ear normal.     Nose: Nose normal.  Eyes:     Conjunctiva/sclera: Conjunctivae normal.  Cardiovascular:     Rate and Rhythm: Normal rate and regular rhythm.     Heart sounds: No murmur heard. Pulmonary:     Effort: No respiratory distress.     Breath sounds: No wheezing, rhonchi or rales.  Abdominal:      Palpations: Abdomen is soft.     Tenderness: There is abdominal tenderness in the periumbilical area and suprapubic area. There is no guarding or rebound. Negative signs include Murphy's sign and McBurney's sign.  Musculoskeletal:     Cervical back: Normal range of motion and neck supple.     Right lower leg: No edema.     Left lower leg: No edema.  Skin:    General: Skin is warm and dry.     Findings: No rash.  Neurological:     General: No focal deficit present.  Mental Status: She is alert. Mental status is at baseline.     Motor: No weakness.  Psychiatric:        Mood and Affect: Mood normal.     ED Results / Procedures / Treatments   Labs (all labs ordered are listed, but only abnormal results are displayed) Labs Reviewed  CBC WITH DIFFERENTIAL/PLATELET - Abnormal; Notable for the following components:      Result Value   Platelets 451 (*)    All other components within normal limits  COMPREHENSIVE METABOLIC PANEL WITH GFR - Abnormal; Notable for the following components:   CO2 21 (*)    Glucose, Bld 155 (*)    All other components within normal limits  URINALYSIS, ROUTINE W REFLEX MICROSCOPIC - Abnormal; Notable for the following components:   Protein, ur TRACE (*)    Leukocytes,Ua SMALL (*)    All other components within normal limits  LIPASE, BLOOD  PREGNANCY, URINE    EKG None  Radiology No results found.  Procedures Procedures    Medications Ordered in ED Medications - No data to display  ED Course/ Medical Decision Making/ A&P    Patient seen and examined. History obtained directly from patient. Work-up including labs, imaging, EKG ordered in triage, if performed, were reviewed.    Labs/EKG: Independently reviewed and interpreted.  This included: CBC unremarkable with normal white blood cell count and hemoglobin; CMP glucose 155, bicarb slightly low at 21 otherwise unremarkable with normal liver and kidney function; lipase normal at 28.   Pending UA.  Imaging: None ordered.  Discussed CT imaging with patient.  At this point, given recent negative workup, if labs are reassuring, unlikely to provide additional information on the cause of symptoms.  If there is something different today, then may consider reimaging.  Will focus on symptom control at this time.  Medications/Fluids: Ordered: IV fluid bolus, for nausea IV Reglan  and IV Benadryl , for pain 0.5 mg hydromorphone , for burning pain in the chest Protonix .  Most recent vital signs reviewed and are as follows: BP (!) 140/81 (BP Location: Left Arm)   Pulse 75   Temp 98.8 F (37.1 C)   Resp 18   SpO2 100%   Initial impression: Chronic abdominal pain and nausea with occasional vomiting, currently being worked up for diabetic gastroparesis, also on chronic opioids which could contribute.  5:03 PM Reassessment performed. Patient appears stable, states some improvement, still has some pain in the left abdomen.  On reexam, no rebound or guarding.  Patient has drank fluids without vomiting.  Labs personally reviewed and interpreted including: UA with some white cells, most recent urine culture was negative.  Patient without irritative UTI symptoms and I do not suspect current symptoms related to UTI or kidney infection.  Pregnancy was negative.  Reviewed pertinent lab work and imaging with patient at bedside. Questions answered.   Most current vital signs reviewed and are as follows: BP (!) 141/77 (BP Location: Left Arm)   Pulse 63   Temp 98.9 F (37.2 C) (Oral)   Resp 18   SpO2 98%   Plan: Discharge to home.   Prescriptions written for: Reglan   Other home care instructions discussed: Nida Barrow diet  ED return instructions discussed: The patient was urged to return to the Emergency Department immediately with worsening of current symptoms, worsening abdominal pain, persistent vomiting, blood noted in stools, fever, or any other concerns. The patient verbalized understanding.    Follow-up instructions discussed: Patient encouraged to follow-up with their  PCP in 3 days.                                   Medical Decision Making Amount and/or Complexity of Data Reviewed Labs: ordered.  Risk Prescription drug management.   For this patient's complaint of abdominal pain, the following conditions were considered on the differential diagnosis: gastritis/PUD, enteritis/duodenitis, appendicitis, cholelithiasis/cholecystitis, cholangitis, pancreatitis, ruptured viscus, colitis, diverticulitis, small/large bowel obstruction, proctitis, cystitis, pyelonephritis, ureteral colic, aortic dissection, aortic aneurysm. In women, ectopic pregnancy, pelvic inflammatory disease, ovarian cysts, and tubo-ovarian abscess were also considered. Atypical chest etiologies were also considered including ACS, PE, and pneumonia.  Workup here is consistent with previous.  Symptoms are subacute to chronic in nature.  Focus was on symptom control today.  No additional imaging as symptoms controlled and labs remain at her baseline.  Patient has upcoming gastric emptying study with GI.  Encouraged her to continue working with PCP and GI for evaluation of her symptoms.  No diabetic emergency suspected at this time.  The patient's vital signs, pertinent lab work and imaging were reviewed and interpreted as discussed in the ED course. Hospitalization was considered for further testing, treatments, or serial exams/observation. However as patient is well-appearing, has a stable exam, and reassuring studies today, I do not feel that they warrant admission at this time. This plan was discussed with the patient who verbalizes agreement and comfort with this plan and seems reliable and able to return to the Emergency Department with worsening or changing symptoms.          Final Clinical Impression(s) / ED Diagnoses Final diagnoses:  Nausea and vomiting, unspecified vomiting type  Recurrent abdominal  pain    Rx / DC Orders ED Discharge Orders          Ordered    metoCLOPramide  (REGLAN ) 10 MG tablet  Every 6 hours        11/23/23 1701              Lyna Sandhoff, PA-C 11/23/23 1705    Rolinda Climes, DO 11/23/23 2240

## 2023-11-23 NOTE — Discharge Instructions (Addendum)
 Please read and follow all provided instructions.  Your diagnoses today include:  1. Nausea and vomiting, unspecified vomiting type   2. Recurrent abdominal pain     Tests performed today include: Complete blood cell count: Normal white blood cell count and red blood cell count Complete metabolic panel: No concerning findings Lipase (pancreas function test): No concerning findings Urinalysis (urine test): Some infection fighting cells, however clinically do not have signs of a UTI today. Pregnancy test (urine or blood, in women only): Negative Vital signs. See below for your results today.   Medications prescribed:  Reglan : Medication for nausea and vomiting  Take any prescribed medications only as directed.  Home care instructions:  Follow any educational materials contained in this packet.  Follow-up instructions: Please follow-up with your primary care provider in the next 3 days for further evaluation of your symptoms.    Return instructions:  SEEK IMMEDIATE MEDICAL ATTENTION IF: The pain does not go away or becomes severe  A temperature above 101F develops  Repeated vomiting occurs (multiple episodes)  The pain becomes localized to portions of the abdomen. The right side could possibly be appendicitis. In an adult, the left lower portion of the abdomen could be colitis or diverticulitis.  Blood is being passed in stools or vomit (bright red or black tarry stools)  You develop chest pain, difficulty breathing, dizziness or fainting, or become confused, poorly responsive, or inconsolable (young children) If you have any other emergent concerns regarding your health  Additional Information: Abdominal (belly) pain can be caused by many things. Your caregiver performed an examination and possibly ordered blood/urine tests and imaging (CT scan, x-rays, ultrasound). Many cases can be observed and treated at home after initial evaluation in the emergency department. Even though you  are being discharged home, abdominal pain can be unpredictable. Therefore, you need a repeated exam if your pain does not resolve, returns, or worsens. Most patients with abdominal pain don't have to be admitted to the hospital or have surgery, but serious problems like appendicitis and gallbladder attacks can start out as nonspecific pain. Many abdominal conditions cannot be diagnosed in one visit, so follow-up evaluations are very important.  Your vital signs today were: BP (!) 141/77 (BP Location: Left Arm)   Pulse 63   Temp 98.9 F (37.2 C) (Oral)   Resp 18   SpO2 98%  If your blood pressure (bp) was elevated above 135/85 this visit, please have this repeated by your doctor within one month. --------------

## 2023-11-23 NOTE — ED Triage Notes (Signed)
 Pt was on doxy in February for bronchitis and this pain started after

## 2023-11-26 ENCOUNTER — Ambulatory Visit (HOSPITAL_BASED_OUTPATIENT_CLINIC_OR_DEPARTMENT_OTHER): Admitting: Physical Therapy

## 2023-11-26 ENCOUNTER — Ambulatory Visit (INDEPENDENT_AMBULATORY_CARE_PROVIDER_SITE_OTHER)

## 2023-11-26 ENCOUNTER — Other Ambulatory Visit (HOSPITAL_BASED_OUTPATIENT_CLINIC_OR_DEPARTMENT_OTHER): Payer: Self-pay

## 2023-11-26 ENCOUNTER — Ambulatory Visit (INDEPENDENT_AMBULATORY_CARE_PROVIDER_SITE_OTHER): Admitting: Podiatry

## 2023-11-26 ENCOUNTER — Encounter: Payer: Self-pay | Admitting: Podiatry

## 2023-11-26 ENCOUNTER — Encounter (HOSPITAL_BASED_OUTPATIENT_CLINIC_OR_DEPARTMENT_OTHER): Payer: Self-pay

## 2023-11-26 DIAGNOSIS — M722 Plantar fascial fibromatosis: Secondary | ICD-10-CM | POA: Diagnosis not present

## 2023-11-26 DIAGNOSIS — M7662 Achilles tendinitis, left leg: Secondary | ICD-10-CM

## 2023-11-26 MED ORDER — METHYLPREDNISOLONE 4 MG PO TBPK
ORAL_TABLET | ORAL | 0 refills | Status: DC
  Filled 2023-11-26: qty 21, 6d supply, fill #0

## 2023-11-26 MED ORDER — METHYLPREDNISOLONE 4 MG PO TBPK
ORAL_TABLET | ORAL | 0 refills | Status: DC
  Filled 2023-11-26: qty 21, fill #0

## 2023-11-26 NOTE — Patient Instructions (Signed)

## 2023-11-26 NOTE — Progress Notes (Signed)
  Subjective:  Patient ID: Molly Archer, female    DOB: 1981/01/02,   MRN: [id number]  No chief complaint on file.   43 y.o. female presents for concern of left heel pain that has been a chornic problem for her but has recently worsened and flared in the last month and has not improved.  Denies any current treatments currently but has tried al sorts of anti-inflammatories in the past. . Denies any other pedal complaints. Denies n/v/f/c.   Past Medical History:  Diagnosis Date   Anemia    Asthma    only uses inhaler in fall and spring   Constipation    GERD (gastroesophageal reflux disease)    diet controlled, occ. uses omeprazole    Headache    otc med prn   History of kidney stones    Hx of Bell's palsy June 2013   Hypertension    currently no meds   Menometrorrhagia    Prediabetes    Psoriasis    Shortness of breath dyspnea    with exercise/exertion   Vitamin D  deficiency     Objective:  Physical Exam: Vascular: DP/PT pulses 2/4 bilateral. CFT <3 seconds. Normal hair growth on digits. No edema.  Skin. No lacerations or abrasions bilateral feet.  Musculoskeletal: MMT 5/5 bilateral lower extremities in DF, PF, Inversion and Eversion. Deceased ROM in DF of ankle joint. Tender to insertion of achilles tednon on the left foot and pain with DF. No pain to medial calcaneal tubercle or with calcaneal squeeze.  Neurological: Sensation intact to light touch.   Assessment:   1. Achilles tendinitis, left leg      Plan:  Patient was evaluated and treated and all questions answered. -Xrays reviewed -Discussed Achilles insertional tendonitis and treatment options with patient.  -Discussed stretching exercises. -Rx Medrolkd dose pack  -Heel lifts provided and discussed proper shoewear.  -Discussed if no improvement will consider MRI/PT/EPAT/PRP injections.  -Patient to return to office as needed or sooner if condition worsens.   Jennefer Moats, DPM

## 2023-11-27 ENCOUNTER — Encounter (HOSPITAL_BASED_OUTPATIENT_CLINIC_OR_DEPARTMENT_OTHER): Payer: Self-pay | Admitting: Orthopaedic Surgery

## 2023-11-27 ENCOUNTER — Ambulatory Visit (INDEPENDENT_AMBULATORY_CARE_PROVIDER_SITE_OTHER)

## 2023-11-27 ENCOUNTER — Ambulatory Visit (HOSPITAL_BASED_OUTPATIENT_CLINIC_OR_DEPARTMENT_OTHER): Admitting: Orthopaedic Surgery

## 2023-11-27 ENCOUNTER — Ambulatory Visit: Admitting: Licensed Clinical Social Worker

## 2023-11-27 DIAGNOSIS — M25562 Pain in left knee: Secondary | ICD-10-CM

## 2023-11-27 DIAGNOSIS — G8929 Other chronic pain: Secondary | ICD-10-CM | POA: Diagnosis not present

## 2023-11-27 DIAGNOSIS — M25561 Pain in right knee: Secondary | ICD-10-CM

## 2023-11-27 MED ORDER — LIDOCAINE HCL 1 % IJ SOLN
4.0000 mL | INTRAMUSCULAR | Status: AC | PRN
  Administered 2023-11-27: 4 mL

## 2023-11-27 MED ORDER — TRIAMCINOLONE ACETONIDE 40 MG/ML IJ SUSP
80.0000 mg | INTRAMUSCULAR | Status: AC | PRN
Start: 2023-11-27 — End: 2023-11-27
  Administered 2023-11-27: 80 mg via INTRA_ARTICULAR
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
## 2023-11-28 ENCOUNTER — Ambulatory Visit (HOSPITAL_BASED_OUTPATIENT_CLINIC_OR_DEPARTMENT_OTHER): Admitting: Physical Therapy

## 2023-12-01 ENCOUNTER — Other Ambulatory Visit (HOSPITAL_BASED_OUTPATIENT_CLINIC_OR_DEPARTMENT_OTHER): Payer: Self-pay | Admitting: Family Medicine

## 2023-12-02 ENCOUNTER — Encounter (HOSPITAL_COMMUNITY)
Admission: RE | Admit: 2023-12-02 | Discharge: 2023-12-02 | Disposition: A | Discharge status: Home / Self Care | Source: Ambulatory Visit | Attending: Gastroenterology | Admitting: Gastroenterology

## 2023-12-02 ENCOUNTER — Encounter (HOSPITAL_BASED_OUTPATIENT_CLINIC_OR_DEPARTMENT_OTHER): Payer: Self-pay | Admitting: Family Medicine

## 2023-12-02 ENCOUNTER — Other Ambulatory Visit (HOSPITAL_BASED_OUTPATIENT_CLINIC_OR_DEPARTMENT_OTHER): Payer: Self-pay | Admitting: Family Medicine

## 2023-12-02 DIAGNOSIS — R1013 Epigastric pain: Secondary | ICD-10-CM | POA: Diagnosis present

## 2023-12-02 DIAGNOSIS — R6881 Early satiety: Secondary | ICD-10-CM | POA: Diagnosis present

## 2023-12-02 DIAGNOSIS — E1165 Type 2 diabetes mellitus with hyperglycemia: Secondary | ICD-10-CM | POA: Insufficient documentation

## 2023-12-02 DIAGNOSIS — R110 Nausea: Secondary | ICD-10-CM | POA: Diagnosis present

## 2023-12-02 MED ORDER — METOCLOPRAMIDE HCL 10 MG PO TABS: 10.0000 mg | ORAL_TABLET | Freq: Four times a day (QID) | ORAL | 0 refills | Status: DC

## 2023-12-02 MED ORDER — TECHNETIUM TC 99M SULFUR COLLOID
2.2000 | Freq: Once | INTRAVENOUS | Status: AC
  Administered 2023-12-02: 2.2 via ORAL

## 2023-12-02 MED ORDER — DICYCLOMINE HCL 20 MG PO TABS: 20.0000 mg | ORAL_TABLET | Freq: Two times a day (BID) | ORAL | 0 refills | Status: DC

## 2023-12-03 ENCOUNTER — Other Ambulatory Visit (HOSPITAL_BASED_OUTPATIENT_CLINIC_OR_DEPARTMENT_OTHER): Payer: Self-pay

## 2023-12-03 MED ORDER — TIZANIDINE HCL 4 MG PO TABS
4.0000 mg | ORAL_TABLET | Freq: Three times a day (TID) | ORAL | 0 refills | Status: DC | PRN
  Filled 2023-12-03: qty 30, 10d supply, fill #0

## 2023-12-03 MED ORDER — ZOLPIDEM TARTRATE 5 MG PO TABS
5.0000 mg | ORAL_TABLET | Freq: Every evening | ORAL | 0 refills | Status: DC | PRN
  Filled 2023-12-03: qty 30, 30d supply, fill #0

## 2023-12-04 ENCOUNTER — Encounter: Payer: Self-pay | Admitting: Gastroenterology

## 2023-12-04 ENCOUNTER — Other Ambulatory Visit: Payer: Self-pay

## 2023-12-04 DIAGNOSIS — R1013 Epigastric pain: Secondary | ICD-10-CM

## 2023-12-04 DIAGNOSIS — R110 Nausea: Secondary | ICD-10-CM

## 2023-12-05 ENCOUNTER — Encounter (HOSPITAL_BASED_OUTPATIENT_CLINIC_OR_DEPARTMENT_OTHER): Admitting: Certified Nurse Midwife

## 2023-12-09 ENCOUNTER — Other Ambulatory Visit (HOSPITAL_BASED_OUTPATIENT_CLINIC_OR_DEPARTMENT_OTHER): Payer: Self-pay

## 2023-12-09 ENCOUNTER — Ambulatory Visit: Admitting: Licensed Clinical Social Worker

## 2023-12-09 MED ORDER — NYSTATIN 100000 UNIT/ML MT SUSP
5.0000 mL | Freq: Four times a day (QID) | OROMUCOSAL | 0 refills | Status: AC
  Filled 2023-12-09: qty 200, 10d supply, fill #0

## 2023-12-09 MED ORDER — LIDOCAINE VISCOUS HCL 2 % MT SOLN
15.0000 mL | OROMUCOSAL | 0 refills | Status: AC | PRN
Start: 2023-12-09 — End: ?
  Filled 2023-12-09: qty 270, 3d supply, fill #0

## 2023-12-10 ENCOUNTER — Other Ambulatory Visit (HOSPITAL_BASED_OUTPATIENT_CLINIC_OR_DEPARTMENT_OTHER): Payer: Self-pay

## 2023-12-11 ENCOUNTER — Ambulatory Visit: Payer: Self-pay | Admitting: Gastroenterology

## 2023-12-11 ENCOUNTER — Encounter (HOSPITAL_COMMUNITY)
Admission: RE | Admit: 2023-12-11 | Discharge: 2023-12-11 | Disposition: A | Discharge status: Home / Self Care | Source: Ambulatory Visit | Attending: Gastroenterology | Admitting: Gastroenterology

## 2023-12-11 DIAGNOSIS — R1013 Epigastric pain: Secondary | ICD-10-CM | POA: Diagnosis present

## 2023-12-11 DIAGNOSIS — R110 Nausea: Secondary | ICD-10-CM | POA: Insufficient documentation

## 2023-12-11 MED ORDER — TECHNETIUM TC 99M MEBROFENIN IV KIT
5.5000 | PACK | Freq: Once | INTRAVENOUS | Status: AC
  Administered 2023-12-11: 5.5 via INTRAVENOUS

## 2023-12-11 NOTE — Telephone Encounter (Signed)
 I spoke to pt and informed her of HIDA scan results and Bayley's recommendation for referral to [facility]. Pt verbalized understanding. I faxed pt demographics, last office visit note, most recent imaging, and insurance cards to [facility]. Awaiting fax confirmation.  Provided [facility] phone number and explained that pt should allow them a week or so to reach out as I was not sure how their workflow is with new referrals. Pt encouraged to call our clinic with any further questions, concerns, new or worsening symptoms.

## 2023-12-20 ENCOUNTER — Telehealth: Payer: Self-pay | Admitting: Gastroenterology

## 2023-12-20 NOTE — Telephone Encounter (Signed)
 Inbound call fro patient stating she called [facility] today due to not hearing anything. She was advised they do not have a referral for her. Patient requesting a call back. Thank you

## 2023-12-20 NOTE — Telephone Encounter (Signed)
 Chart reviewed. I received a fax confirmation from 12/11/23 when original referral documentation. I spoke to [facility] Nurse, adult) confirms she has it and is currently working on her workload from 12/12/23. She is going to reach out to the pt. I spoke to pt and informed her that per Lenon Radar "she is going to reach out to her prior to leaving the office today". Pt encouraged to call back with further questions, concerns, new or worsening symptoms. Pt verbalized understanding.

## 2023-12-21 ENCOUNTER — Other Ambulatory Visit: Payer: Self-pay | Admitting: Physical Medicine & Rehabilitation

## 2023-12-21 ENCOUNTER — Other Ambulatory Visit (HOSPITAL_BASED_OUTPATIENT_CLINIC_OR_DEPARTMENT_OTHER): Payer: Self-pay | Admitting: Family Medicine

## 2023-12-21 DIAGNOSIS — M1711 Unilateral primary osteoarthritis, right knee: Secondary | ICD-10-CM

## 2023-12-21 DIAGNOSIS — M47816 Spondylosis without myelopathy or radiculopathy, lumbar region: Secondary | ICD-10-CM

## 2023-12-22 ENCOUNTER — Other Ambulatory Visit (HOSPITAL_BASED_OUTPATIENT_CLINIC_OR_DEPARTMENT_OTHER): Payer: Self-pay | Admitting: Family Medicine

## 2023-12-23 ENCOUNTER — Other Ambulatory Visit (HOSPITAL_BASED_OUTPATIENT_CLINIC_OR_DEPARTMENT_OTHER): Payer: Self-pay

## 2023-12-23 MED ORDER — DOXAZOSIN MESYLATE 1 MG PO TABS
1.0000 mg | ORAL_TABLET | Freq: Every day | ORAL | 3 refills | Status: DC
  Filled 2023-12-23: qty 30, 30d supply, fill #0
  Filled 2024-01-27: qty 30, 30d supply, fill #1
  Filled 2024-02-28: qty 30, 30d supply, fill #2
  Filled 2024-04-02: qty 30, 30d supply, fill #3

## 2023-12-24 ENCOUNTER — Other Ambulatory Visit: Payer: Self-pay

## 2023-12-24 ENCOUNTER — Other Ambulatory Visit (HOSPITAL_BASED_OUTPATIENT_CLINIC_OR_DEPARTMENT_OTHER): Payer: Self-pay

## 2023-12-24 MED ORDER — TRIAMCINOLONE ACETONIDE 0.1 % EX CREA
1.0000 | TOPICAL_CREAM | Freq: Two times a day (BID) | CUTANEOUS | 0 refills | Status: DC
  Filled 2023-12-24: qty 30, 15d supply, fill #0

## 2023-12-24 MED ORDER — NAFTIFINE HCL 2 % EX CREA
TOPICAL_CREAM | CUTANEOUS | 1 refills | Status: DC
  Filled 2023-12-24: qty 60, 30d supply, fill #0

## 2023-12-24 MED ORDER — CLOBETASOL PROPIONATE 0.05 % EX OINT
TOPICAL_OINTMENT | Freq: Two times a day (BID) | CUTANEOUS | 1 refills | Status: DC
  Filled 2023-12-24: qty 30, 14d supply, fill #0
  Filled 2024-02-28: qty 30, 14d supply, fill #1

## 2023-12-25 ENCOUNTER — Other Ambulatory Visit: Payer: Self-pay

## 2023-12-26 ENCOUNTER — Other Ambulatory Visit (HOSPITAL_BASED_OUTPATIENT_CLINIC_OR_DEPARTMENT_OTHER): Payer: Self-pay

## 2023-12-26 MED ORDER — DICLOFENAC SODIUM 75 MG PO TBEC
75.0000 mg | DELAYED_RELEASE_TABLET | Freq: Two times a day (BID) | ORAL | 3 refills | Status: DC
  Filled 2023-12-26: qty 60, 30d supply, fill #0
  Filled 2024-02-28: qty 60, 30d supply, fill #1
  Filled 2024-04-02: qty 60, 30d supply, fill #2
  Filled 2024-05-19: qty 60, 30d supply, fill #3

## 2023-12-27 ENCOUNTER — Encounter (HOSPITAL_COMMUNITY): Payer: Self-pay

## 2023-12-27 NOTE — Pre-Procedure Instructions (Signed)
 Surgical Instructions   Your procedure is scheduled on Wednesday, June 4th. Report to [facility] Main Entrance "A" at 09:15 A.M., then check in with the Admitting office. Any questions or running late day of surgery: call [phone]  Questions prior to your surgery date: call [phone], Monday-Friday, 8am-4pm. If you experience any cold or flu symptoms such as cough, fever, chills, shortness of breath, etc. between now and your scheduled surgery, please notify us  at the above number.     Remember:  Do not eat after midnight the night before your surgery   You may drink clear liquids until 08:15 AM the morning of your surgery.   Clear liquids allowed are: Water, Non-Citrus Juices (without pulp), Carbonated Beverages, Clear Tea (no milk, honey, etc.), Black Coffee Only (NO MILK, CREAM OR POWDERED CREAMER of any kind), and Gatorade.    Take these medicines the morning of surgery with A SIP OF WATER  doxazosin  (CARDURA )  NIFEdipine  (PROCARDIA  XL/NIFEDICAL XL)  norethindrone  (AYGESTIN )    May take these medicines IF NEEDED: albuterol  (VENTOLIN  HFA)- bring inhaler with you on day of surgery Naphazoline-Pheniramine (ALLERGY EYE OP)  Oxycodone  HCl  tiZANidine  (ZANAFLEX )   One week prior to surgery, STOP taking any Aspirin  (unless otherwise instructed by your surgeon) Aleve , Naproxen , Ibuprofen , Motrin , Advil , Goody's, BC's, all herbal medications, fish oil, and non-prescription vitamins. This includes diclofenac  (VOLTAREN ).  WHAT DO I DO ABOUT MY DIABETES MEDICATION?   Do not take glipiZIDE  (GLUCOTROL  XL) the evening before surgery or the morning of surgery.   HOW TO MANAGE YOUR DIABETES BEFORE AND AFTER SURGERY  Why is it important to control my blood sugar before and after surgery? Improving blood sugar levels before and after surgery helps healing and can limit problems. A way of improving blood sugar control is eating a healthy diet by:  Eating less sugar and  carbohydrates  Increasing activity/exercise  Talking with your doctor about reaching your blood sugar goals High blood sugars (greater than 180 mg/dL) can raise your risk of infections and slow your recovery, so you will need to focus on controlling your diabetes during the weeks before surgery. Make sure that the doctor who takes care of your diabetes knows about your planned surgery including the date and location.  How do I manage my blood sugar before surgery? Check your blood sugar at least 4 times a day, starting 2 days before surgery, to make sure that the level is not too high or low.  Check your blood sugar the morning of your surgery when you wake up and every 2 hours until you get to the Short Stay unit.  If your blood sugar is less than 70 mg/dL, you will need to treat for low blood sugar: Do not take insulin . Treat a low blood sugar (less than 70 mg/dL) with  cup of clear juice (cranberry or apple), 4 glucose tablets, OR glucose gel. Recheck blood sugar in 15 minutes after treatment (to make sure it is greater than 70 mg/dL). If your blood sugar is not greater than 70 mg/dL on recheck, call [phone] for further instructions. Report your blood sugar to the short stay nurse when you get to Short Stay.  If you are admitted to the hospital after surgery: Your blood sugar will be checked by the staff and you will probably be given insulin  after surgery (instead of oral diabetes medicines) to make sure you have good blood sugar levels. The goal for blood sugar control after surgery is 80-180 mg/dL.  Do NOT Smoke (Tobacco/Vaping) for 24 hours prior to your procedure.  If you use a CPAP at night, you may bring your mask/headgear for your overnight stay.   You will be asked to remove any contacts, glasses, piercing's, hearing aid's, dentures/partials prior to surgery. Please bring cases for these items if needed.    Patients discharged the day of surgery will  not be allowed to drive home, and someone needs to stay with them for 24 hours.  SURGICAL WAITING ROOM VISITATION Patients may have no more than 2 support people in the waiting area - these visitors may rotate.   Pre-op nurse will coordinate an appropriate time for 1 ADULT support person, who may not rotate, to accompany patient in pre-op.  Children under the age of 40 must have an adult with them who is not the patient and must remain in the main waiting area with an adult.  If the patient needs to stay at the hospital during part of their recovery, the visitor guidelines for inpatient rooms apply.  Please refer to the [facility] website for the visitor guidelines for any additional information.   If you received a COVID test during your pre-op visit  it is requested that you wear a mask when out in public, stay away from anyone that may not be feeling well and notify your surgeon if you develop symptoms. If you have been in contact with anyone that has tested positive in the last 10 days please notify you surgeon.      Pre-operative CHG Bathing Instructions   You can play a key role in reducing the risk of infection after surgery. Your skin needs to be as free of germs as possible. You can reduce the number of germs on your skin by washing with CHG (chlorhexidine  gluconate) soap before surgery. CHG is an antiseptic soap that kills germs and continues to kill germs even after washing.   DO NOT use if you have an allergy to chlorhexidine /CHG or antibacterial soaps. If your skin becomes reddened or irritated, stop using the CHG and notify one of our RNs at [phone].              TAKE A SHOWER THE NIGHT BEFORE SURGERY AND THE DAY OF SURGERY    Please keep in mind the following:  DO NOT shave, including legs and underarms, 48 hours prior to surgery.   You may shave your face before/day of surgery.  Place clean sheets on your bed the night before surgery Use a clean washcloth (not used  since being washed) for each shower. DO NOT sleep with pet's night before surgery.  CHG Shower Instructions:  Wash your face and private area with normal soap. If you choose to wash your hair, wash first with your normal shampoo.  After you use shampoo/soap, rinse your hair and body thoroughly to remove shampoo/soap residue.  Turn the water OFF and apply half the bottle of CHG soap to a CLEAN washcloth.  Apply CHG soap ONLY FROM YOUR NECK DOWN TO YOUR TOES (washing for 3-5 minutes)  DO NOT use CHG soap on face, private areas, open wounds, or sores.  Pay special attention to the area where your surgery is being performed.  If you are having back surgery, having someone wash your back for you may be helpful. Wait 2 minutes after CHG soap is applied, then you may rinse off the CHG soap.  Pat dry with a clean towel  Put on clean pajamas  Additional instructions for the day of surgery: DO NOT APPLY any lotions, deodorants, cologne, or perfumes.   Do not wear jewelry or makeup Do not wear nail polish, gel polish, artificial nails, or any other type of covering on natural nails (fingers and toes) Do not bring valuables to the hospital. [facility] is not responsible for valuables/personal belongings. Put on clean/comfortable clothes.  Please brush your teeth.  Ask your nurse before applying any prescription medications to the skin.

## 2023-12-29 ENCOUNTER — Other Ambulatory Visit: Payer: Self-pay | Admitting: Physical Medicine & Rehabilitation

## 2023-12-29 DIAGNOSIS — M1711 Unilateral primary osteoarthritis, right knee: Secondary | ICD-10-CM

## 2023-12-29 DIAGNOSIS — M47816 Spondylosis without myelopathy or radiculopathy, lumbar region: Secondary | ICD-10-CM

## 2023-12-30 ENCOUNTER — Other Ambulatory Visit: Payer: Self-pay

## 2023-12-30 ENCOUNTER — Encounter (HOSPITAL_COMMUNITY): Payer: Self-pay

## 2023-12-30 ENCOUNTER — Encounter (HOSPITAL_COMMUNITY)
Admission: RE | Admit: 2023-12-30 | Discharge: 2023-12-30 | Disposition: A | Discharge status: Home / Self Care | Source: Ambulatory Visit | Attending: General Surgery | Admitting: General Surgery

## 2023-12-30 ENCOUNTER — Other Ambulatory Visit (HOSPITAL_BASED_OUTPATIENT_CLINIC_OR_DEPARTMENT_OTHER): Payer: Self-pay

## 2023-12-30 VITALS — BP 151/88 | HR 87 | Temp 98.5°F | Resp 20 | Ht 65.0 in | Wt 335.5 lb

## 2023-12-30 DIAGNOSIS — I10 Essential (primary) hypertension: Secondary | ICD-10-CM | POA: Insufficient documentation

## 2023-12-30 DIAGNOSIS — E119 Type 2 diabetes mellitus without complications: Secondary | ICD-10-CM

## 2023-12-30 DIAGNOSIS — K219 Gastro-esophageal reflux disease without esophagitis: Secondary | ICD-10-CM | POA: Insufficient documentation

## 2023-12-30 DIAGNOSIS — Z6841 Body Mass Index (BMI) 40.0 and over, adult: Secondary | ICD-10-CM | POA: Insufficient documentation

## 2023-12-30 DIAGNOSIS — E66813 Obesity, class 3: Secondary | ICD-10-CM | POA: Insufficient documentation

## 2023-12-30 DIAGNOSIS — Z01812 Encounter for preprocedural laboratory examination: Secondary | ICD-10-CM | POA: Insufficient documentation

## 2023-12-30 DIAGNOSIS — R0602 Shortness of breath: Secondary | ICD-10-CM | POA: Diagnosis not present

## 2023-12-30 DIAGNOSIS — J45909 Unspecified asthma, uncomplicated: Secondary | ICD-10-CM | POA: Insufficient documentation

## 2023-12-30 DIAGNOSIS — Z79899 Other long term (current) drug therapy: Secondary | ICD-10-CM | POA: Insufficient documentation

## 2023-12-30 DIAGNOSIS — J9311 Primary spontaneous pneumothorax: Secondary | ICD-10-CM | POA: Diagnosis not present

## 2023-12-30 DIAGNOSIS — Z01818 Encounter for other preprocedural examination: Secondary | ICD-10-CM

## 2023-12-30 HISTORY — DX: Cardiac murmur, unspecified: R01.1

## 2023-12-30 HISTORY — DX: Type 2 diabetes mellitus without complications: E11.9

## 2023-12-30 HISTORY — DX: Unspecified osteoarthritis, unspecified site: M19.90

## 2023-12-30 LAB — CBC
HCT: 45.3 % (ref 36.0–46.0)
Hemoglobin: 14.4 g/dL (ref 12.0–15.0)
MCH: 28.3 pg (ref 26.0–34.0)
MCHC: 31.8 g/dL (ref 30.0–36.0)
MCV: 89 fL (ref 80.0–100.0)
Platelets: 403 10*3/uL — ABNORMAL HIGH (ref 150–400)
RBC: 5.09 MIL/uL (ref 3.87–5.11)
RDW: 14.9 % (ref 11.5–15.5)
WBC: 8.1 10*3/uL (ref 4.0–10.5)
nRBC: 0 % (ref 0.0–0.2)

## 2023-12-30 LAB — BASIC METABOLIC PANEL WITH GFR
Anion gap: 12 (ref 5–15)
BUN: 9 mg/dL (ref 6–20)
CO2: 26 mmol/L (ref 22–32)
Calcium: 9.5 mg/dL (ref 8.9–10.3)
Chloride: 100 mmol/L (ref 98–111)
Creatinine, Ser: 0.63 mg/dL (ref 0.44–1.00)
GFR, Estimated: >60 mL/min (ref >60)
Glucose, Bld: 78 mg/dL (ref 70–99)
Potassium: 3.7 mmol/L (ref 3.5–5.1)
Sodium: 138 mmol/L (ref 135–145)

## 2023-12-30 LAB — GLUCOSE, CAPILLARY: Glucose-Capillary: 90 mg/dL (ref 70–99)

## 2023-12-30 MED ORDER — OXYCODONE HCL 10 MG PO TABS
10.0000 mg | ORAL_TABLET | Freq: Three times a day (TID) | ORAL | 0 refills | Status: DC | PRN
  Filled 2023-12-30: qty 75, 25d supply, fill #0

## 2023-12-30 NOTE — Progress Notes (Signed)
 PCP - Dr. Court Distance [place] Cardiologist - Dr. Sheryle Donning  PPM/ICD - denies   Chest x-ray - 09/14/23- CE EKG - 05/21/23 Stress Test - denies ECHO - denies Cardiac Cath - denies  Sleep Study - denies   DM- pt does not check CBG at home and does not know typical fasting levels  Last dose of GLP1 agonist-  n/a   ASA/Blood Thinner Instructions: n/a   ERAS Protcol - clears until 0815   COVID TEST- n/a   Anesthesia review: yes, cardiac hx  Patient denies shortness of breath, fever, cough and chest pain at PAT appointment   All instructions explained to the patient, with a verbal understanding of the material. Patient agrees to go over the instructions while at home for a better understanding. The opportunity to ask questions was provided.

## 2023-12-31 ENCOUNTER — Ambulatory Visit: Payer: Self-pay | Admitting: General Surgery

## 2023-12-31 NOTE — Progress Notes (Signed)
 Spoke with the pt, she will arrive tom at 0815. NPO post mn.

## 2023-12-31 NOTE — Progress Notes (Signed)
 Anesthesia Chart Review:  43 yo female follows with cardiology for hx of difficult to control hypertension. Currently maintained on doxazosin , hydrochlorothiazide , irbesartan , nifedipine .  Last seen by Dr. Veryl Gottron on 08/19/2023, stable at that time, BP 132/64, 108-month follow-up recommended.  She is also noted to have a 1 out of 6 systolic murmur, no additional workup was recommended.  Other pertinent history includes history of Bell's palsy (2013), asthma, GERD, NIDDM2 A1c 8.0 on 11/12/2023, class III obesity BMI 55.  Review of prior anesthesia records shows that Glide scope was used for intubation at [facility] on 12/30/2019.  Preop labs reviewed, unremarkable.   EKG 05/21/23: Normal sinus rhythm. Rate 84. Nonspecific T wave abnormality.    [facility] Short Stay Center/Anesthesiology Phone [phone] 12/31/2023 10:07 AM

## 2023-12-31 NOTE — Anesthesia Preprocedure Evaluation (Signed)
 Anesthesia Evaluation  Patient identified by MRN, date of birth, ID band Patient awake    Reviewed: Allergy & Precautions, NPO status , Patient's Chart, lab work & pertinent test results  Airway Mallampati: III  TM Distance: >3 FB Neck ROM: Full    Dental  (+) Dental Advisory Given, Missing   Pulmonary asthma    Pulmonary exam normal breath sounds clear to auscultation       Cardiovascular hypertension, Pt. on medications Normal cardiovascular exam Rhythm:Regular Rate:Normal     Neuro/Psych  Headaches PSYCHIATRIC DISORDERS Anxiety      Neuromuscular disease    GI/Hepatic ,GERD  Medicated,,BILIARY DYSKINESIA   Endo/Other  diabetes, Type 2, Oral Hypoglycemic Agents  Class 4 obesity (BMI 55)  Renal/GU negative Renal ROS     Musculoskeletal  (+) Arthritis ,    Abdominal   Peds  Hematology negative hematology ROS (+)   Anesthesia Other Findings Day of surgery medications reviewed with the patient.  Reproductive/Obstetrics                             Anesthesia Physical Anesthesia Plan  ASA: 4  Anesthesia Plan: General   Post-op Pain Management: Tylenol  PO (pre-op)*   Induction: Intravenous  PONV Risk Score and Plan: 4 or greater and Scopolamine  patch - Pre-op, Midazolam , Dexamethasone  and Ondansetron   Airway Management Planned: Oral ETT and Video Laryngoscope Planned  Additional Equipment:   Intra-op Plan:   Post-operative Plan: Extubation in OR  Informed Consent: I have reviewed the patients History and Physical, chart, labs and discussed the procedure including the risks, benefits and alternatives for the proposed anesthesia with the patient or authorized representative who has indicated his/her understanding and acceptance.     Dental advisory given  Plan Discussed with: CRNA  Anesthesia Plan Comments: (PAT note by Rudy Costain, PA-C: 43 yo female follows with cardiology  for hx of difficult to control hypertension. Currently maintained on doxazosin , hydrochlorothiazide , irbesartan , nifedipine .  Last seen by Dr. Veryl Gottron on 08/19/2023, stable at that time, BP 132/64, 73-month follow-up recommended.  She is also noted to have a 1 out of 6 systolic murmur, no additional workup was recommended.  Other pertinent history includes history of Bell's palsy (2013), asthma, GERD, NIDDM2 A1c 8.0 on 11/12/2023, class III obesity BMI 55.  Review of prior anesthesia records shows that Glide scope was used for intubation at [facility] on 12/30/2019.  Preop labs reviewed, unremarkable.   EKG 05/21/23: Normal sinus rhythm. Rate 84. Nonspecific T wave abnormality.   )        Anesthesia Quick Evaluation

## 2024-01-01 ENCOUNTER — Ambulatory Visit (HOSPITAL_COMMUNITY): Admitting: Anesthesiology

## 2024-01-01 ENCOUNTER — Ambulatory Visit (HOSPITAL_COMMUNITY)
Admission: RE | Admit: 2024-01-01 | Discharge: 2024-01-01 | Disposition: A | Discharge status: Home / Self Care | Attending: General Surgery | Admitting: General Surgery

## 2024-01-01 ENCOUNTER — Encounter (HOSPITAL_COMMUNITY)
Admission: RE | Disposition: A | Discharge status: Home / Self Care | Payer: Self-pay | Source: Home / Self Care | Attending: General Surgery

## 2024-01-01 ENCOUNTER — Ambulatory Visit (HOSPITAL_COMMUNITY): Payer: Self-pay | Admitting: Physician Assistant

## 2024-01-01 ENCOUNTER — Other Ambulatory Visit: Payer: Self-pay

## 2024-01-01 ENCOUNTER — Encounter (HOSPITAL_COMMUNITY): Payer: Self-pay | Admitting: General Surgery

## 2024-01-01 DIAGNOSIS — E119 Type 2 diabetes mellitus without complications: Secondary | ICD-10-CM

## 2024-01-01 DIAGNOSIS — E1165 Type 2 diabetes mellitus with hyperglycemia: Secondary | ICD-10-CM | POA: Insufficient documentation

## 2024-01-01 DIAGNOSIS — E6689 Other obesity not elsewhere classified: Secondary | ICD-10-CM | POA: Insufficient documentation

## 2024-01-01 DIAGNOSIS — K760 Fatty (change of) liver, not elsewhere classified: Secondary | ICD-10-CM | POA: Insufficient documentation

## 2024-01-01 DIAGNOSIS — J45909 Unspecified asthma, uncomplicated: Secondary | ICD-10-CM | POA: Diagnosis not present

## 2024-01-01 DIAGNOSIS — K828 Other specified diseases of gallbladder: Secondary | ICD-10-CM

## 2024-01-01 DIAGNOSIS — Z79899 Other long term (current) drug therapy: Secondary | ICD-10-CM | POA: Insufficient documentation

## 2024-01-01 DIAGNOSIS — K8010 Calculus of gallbladder with chronic cholecystitis without obstruction: Secondary | ICD-10-CM | POA: Insufficient documentation

## 2024-01-01 DIAGNOSIS — K219 Gastro-esophageal reflux disease without esophagitis: Secondary | ICD-10-CM | POA: Insufficient documentation

## 2024-01-01 DIAGNOSIS — Z7984 Long term (current) use of oral hypoglycemic drugs: Secondary | ICD-10-CM | POA: Insufficient documentation

## 2024-01-01 DIAGNOSIS — Z6841 Body Mass Index (BMI) 40.0 and over, adult: Secondary | ICD-10-CM | POA: Insufficient documentation

## 2024-01-01 DIAGNOSIS — I10 Essential (primary) hypertension: Secondary | ICD-10-CM | POA: Diagnosis not present

## 2024-01-01 DIAGNOSIS — Z01818 Encounter for other preprocedural examination: Secondary | ICD-10-CM

## 2024-01-01 HISTORY — PX: CHOLECYSTECTOMY: SHX55

## 2024-01-01 LAB — GLUCOSE, CAPILLARY
Glucose-Capillary: 149 mg/dL — ABNORMAL HIGH (ref 70–99)
Glucose-Capillary: 141 mg/dL — ABNORMAL HIGH (ref 70–99)

## 2024-01-01 LAB — POCT PREGNANCY, URINE: Preg Test, Ur: NEGATIVE

## 2024-01-01 SURGERY — LAPAROSCOPIC CHOLECYSTECTOMY
Anesthesia: General | Site: Abdomen

## 2024-01-01 MED ORDER — ONDANSETRON HCL 4 MG/2ML IJ SOLN
INTRAMUSCULAR | Status: AC
Start: 2024-01-01 — End: ?
  Filled 2024-01-01: qty 4

## 2024-01-01 MED ORDER — ACETAMINOPHEN 500 MG PO TABS: 1000.0000 mg | ORAL_TABLET | ORAL | Status: AC

## 2024-01-01 MED ORDER — BUPIVACAINE-EPINEPHRINE (PF) 0.25% -1:200000 IJ SOLN
INTRAMUSCULAR | Status: AC
  Filled 2024-01-01: qty 30

## 2024-01-01 MED ORDER — GENTAMICIN SULFATE 40 MG/ML IJ SOLN
5.0000 mg/kg | INTRAVENOUS | Status: AC
  Administered 2024-01-01: 761.2 mg via INTRAVENOUS
  Filled 2024-01-01: qty 19

## 2024-01-01 MED ORDER — SPY AGENT GREEN - (INDOCYANINE FOR INJECTION)
1.2500 mg | Freq: Once | INTRAMUSCULAR | Status: AC
  Administered 2024-01-01: 1.25 mg via INTRAVENOUS
  Filled 2024-01-01: qty 10

## 2024-01-01 MED ORDER — FENTANYL CITRATE (PF) 100 MCG/2ML IJ SOLN: 25.0000 ug | INTRAMUSCULAR | Status: DC | PRN

## 2024-01-01 MED ORDER — ROCURONIUM BROMIDE 10 MG/ML (PF) SYRINGE
PREFILLED_SYRINGE | INTRAVENOUS | Status: DC | PRN
  Administered 2024-01-01: 60 mg via INTRAVENOUS
  Administered 2024-01-01: 20 mg via INTRAVENOUS

## 2024-01-01 MED ORDER — OXYCODONE HCL 5 MG PO TABS: 5.0000 mg | ORAL_TABLET | Freq: Once | ORAL | Status: DC | PRN

## 2024-01-01 MED ORDER — DEXAMETHASONE SODIUM PHOSPHATE 10 MG/ML IJ SOLN
INTRAMUSCULAR | Status: DC | PRN
Start: 2024-01-01 — End: 2024-01-01
  Administered 2024-01-01: 10 mg via INTRAVENOUS

## 2024-01-01 MED ORDER — CLINDAMYCIN PHOSPHATE 900 MG/50ML IV SOLN
INTRAVENOUS | Status: AC
  Filled 2024-01-01: qty 50

## 2024-01-01 MED ORDER — LIDOCAINE 2% (20 MG/ML) 5 ML SYRINGE
INTRAMUSCULAR | Status: AC
  Filled 2024-01-01: qty 5

## 2024-01-01 MED ORDER — CHLORHEXIDINE GLUCONATE CLOTH 2 % EX PADS: 6.0000 | MEDICATED_PAD | Freq: Once | CUTANEOUS | Status: DC

## 2024-01-01 MED ORDER — DEXAMETHASONE SODIUM PHOSPHATE 10 MG/ML IJ SOLN
INTRAMUSCULAR | Status: AC
Start: 2024-01-01 — End: ?
  Filled 2024-01-01: qty 1

## 2024-01-01 MED ORDER — LIDOCAINE 2% (20 MG/ML) 5 ML SYRINGE
INTRAMUSCULAR | Status: AC
  Filled 2024-01-01: qty 10

## 2024-01-01 MED ORDER — PHENYLEPHRINE 80 MCG/ML (10ML) SYRINGE FOR IV PUSH (FOR BLOOD PRESSURE SUPPORT)
PREFILLED_SYRINGE | INTRAVENOUS | Status: DC | PRN
Start: 2024-01-01 — End: 2024-01-01
  Administered 2024-01-01: 80 ug via INTRAVENOUS
  Administered 2024-01-01: 160 ug via INTRAVENOUS
  Administered 2024-01-01 (×5): 80 ug via INTRAVENOUS

## 2024-01-01 MED ORDER — OXYCODONE HCL 5 MG/5ML PO SOLN: 5.0000 mg | Freq: Once | ORAL | Status: DC | PRN

## 2024-01-01 MED ORDER — ONDANSETRON HCL 4 MG/2ML IJ SOLN
INTRAMUSCULAR | Status: DC | PRN
  Administered 2024-01-01: 4 mg via INTRAVENOUS

## 2024-01-01 MED ORDER — KETOROLAC TROMETHAMINE 30 MG/ML IJ SOLN
INTRAMUSCULAR | Status: AC
Start: 2024-01-01 — End: ?
  Filled 2024-01-01: qty 1

## 2024-01-01 MED ORDER — GENTAMICIN IN SALINE 1.6-0.9 MG/ML-% IV SOLN
INTRAVENOUS | Status: AC
  Filled 2024-01-01: qty 50

## 2024-01-01 MED ORDER — ORAL CARE MOUTH RINSE: 15.0000 mL | Freq: Once | OROMUCOSAL | Status: AC

## 2024-01-01 MED ORDER — MIDAZOLAM HCL 5 MG/5ML IJ SOLN
INTRAMUSCULAR | Status: DC | PRN
  Administered 2024-01-01: 2 mg via INTRAVENOUS

## 2024-01-01 MED ORDER — LIDOCAINE 2% (20 MG/ML) 5 ML SYRINGE
INTRAMUSCULAR | Status: DC | PRN
  Administered 2024-01-01: 100 mg via INTRAVENOUS

## 2024-01-01 MED ORDER — LACTATED RINGERS IV SOLN: INTRAVENOUS | Status: DC

## 2024-01-01 MED ORDER — SCOPOLAMINE 1 MG/3DAYS TD PT72
1.0000 | MEDICATED_PATCH | Freq: Once | TRANSDERMAL | Status: DC
  Administered 2024-01-01: 1.5 mg via TRANSDERMAL
  Filled 2024-01-01: qty 1

## 2024-01-01 MED ORDER — ACETAMINOPHEN 500 MG PO TABS
ORAL_TABLET | ORAL | Status: AC
  Administered 2024-01-01: 1000 mg via ORAL
  Filled 2024-01-01: qty 2

## 2024-01-01 MED ORDER — DROPERIDOL 2.5 MG/ML IJ SOLN
0.6250 mg | Freq: Once | INTRAMUSCULAR | Status: AC | PRN
Start: 2024-01-01 — End: 2024-01-01
  Administered 2024-01-01: 0.625 mg via INTRAVENOUS

## 2024-01-01 MED ORDER — FENTANYL CITRATE (PF) 100 MCG/2ML IJ SOLN
INTRAMUSCULAR | Status: DC | PRN
  Administered 2024-01-01: 50 ug via INTRAVENOUS
  Administered 2024-01-01: 100 ug via INTRAVENOUS

## 2024-01-01 MED ORDER — ROCURONIUM BROMIDE 10 MG/ML (PF) SYRINGE
PREFILLED_SYRINGE | INTRAVENOUS | Status: AC
  Filled 2024-01-01: qty 10

## 2024-01-01 MED ORDER — SODIUM CHLORIDE 0.9 % IR SOLN
Status: DC | PRN
  Administered 2024-01-01: 1000 mL

## 2024-01-01 MED ORDER — PROPOFOL 10 MG/ML IV BOLUS
INTRAVENOUS | Status: AC
Start: 2024-01-01 — End: ?
  Filled 2024-01-01: qty 20

## 2024-01-01 MED ORDER — MIDAZOLAM HCL 2 MG/2ML IJ SOLN
INTRAMUSCULAR | Status: AC
  Filled 2024-01-01: qty 2

## 2024-01-01 MED ORDER — CLINDAMYCIN PHOSPHATE 900 MG/50ML IV SOLN
900.0000 mg | INTRAVENOUS | Status: AC
  Administered 2024-01-01: 900 mg via INTRAVENOUS

## 2024-01-01 MED ORDER — FENTANYL CITRATE (PF) 250 MCG/5ML IJ SOLN
INTRAMUSCULAR | Status: AC
  Filled 2024-01-01: qty 5

## 2024-01-01 MED ORDER — 0.9 % SODIUM CHLORIDE (POUR BTL) OPTIME
TOPICAL | Status: DC | PRN
  Administered 2024-01-01: 1000 mL

## 2024-01-01 MED ORDER — PHENYLEPHRINE 80 MCG/ML (10ML) SYRINGE FOR IV PUSH (FOR BLOOD PRESSURE SUPPORT)
PREFILLED_SYRINGE | INTRAVENOUS | Status: AC
  Filled 2024-01-01: qty 10

## 2024-01-01 MED ORDER — DROPERIDOL 2.5 MG/ML IJ SOLN
INTRAMUSCULAR | Status: AC
  Filled 2024-01-01: qty 2

## 2024-01-01 MED ORDER — BUPIVACAINE-EPINEPHRINE 0.25% -1:200000 IJ SOLN
INTRAMUSCULAR | Status: DC | PRN
  Administered 2024-01-01: 10 mL

## 2024-01-01 MED ORDER — ACETAMINOPHEN 500 MG PO TABS: 1000.0000 mg | ORAL_TABLET | Freq: Three times a day (TID) | ORAL | Status: AC

## 2024-01-01 MED ORDER — KETOROLAC TROMETHAMINE 30 MG/ML IJ SOLN
INTRAMUSCULAR | Status: DC | PRN
Start: 2024-01-01 — End: 2024-01-01
  Administered 2024-01-01: 30 mg via INTRAVENOUS

## 2024-01-01 MED ORDER — SUGAMMADEX SODIUM 200 MG/2ML IV SOLN
INTRAVENOUS | Status: DC | PRN
Start: 2024-01-01 — End: 2024-01-01
  Administered 2024-01-01: 400 mg via INTRAVENOUS

## 2024-01-01 MED ORDER — ALBUTEROL SULFATE HFA 108 (90 BASE) MCG/ACT IN AERS
INHALATION_SPRAY | RESPIRATORY_TRACT | Status: DC | PRN
Start: 2024-01-01 — End: 2024-01-01
  Administered 2024-01-01 (×2): 5 via RESPIRATORY_TRACT

## 2024-01-01 MED ORDER — CHLORHEXIDINE GLUCONATE 0.12 % MT SOLN
15.0000 mL | Freq: Once | OROMUCOSAL | Status: AC
  Administered 2024-01-01: 15 mL via OROMUCOSAL
  Filled 2024-01-01: qty 15

## 2024-01-01 MED ORDER — PROPOFOL 10 MG/ML IV BOLUS
INTRAVENOUS | Status: DC | PRN
  Administered 2024-01-01: 200 mg via INTRAVENOUS

## 2024-01-01 SURGICAL SUPPLY — 37 items
BAG COUNTER SPONGE SURGICOUNT (BAG) ×1 IMPLANT
BENZOIN TINCTURE PRP APPL 2/3 (GAUZE/BANDAGES/DRESSINGS) ×1 IMPLANT
BLADE CLIPPER SURG (BLADE) IMPLANT
BNDG ADH 1X3 SHEER STRL LF (GAUZE/BANDAGES/DRESSINGS) ×3 IMPLANT
CANISTER SUCTION 3000ML PPV (SUCTIONS) ×1 IMPLANT
CHLORAPREP W/TINT 26 (MISCELLANEOUS) ×1 IMPLANT
CLIP APPLIE 5 13 M/L LIGAMAX5 (MISCELLANEOUS) ×1 IMPLANT
COVER SURGICAL LIGHT HANDLE (MISCELLANEOUS) ×1 IMPLANT
DERMABOND ADVANCED .7 DNX12 (GAUZE/BANDAGES/DRESSINGS) IMPLANT
ELECTRODE REM PT RTRN 9FT ADLT (ELECTROSURGICAL) ×1 IMPLANT
GAUZE SPONGE 2X2 8PLY STRL LF (GAUZE/BANDAGES/DRESSINGS) ×1 IMPLANT
GLOVE PI ORTHO PRO STRL 7.5 (GLOVE) ×1 IMPLANT
GOWN STRL REUS W/ TWL LRG LVL3 (GOWN DISPOSABLE) ×2 IMPLANT
GOWN STRL REUS W/ TWL XL LVL3 (GOWN DISPOSABLE) ×1 IMPLANT
GRASPER SUT TROCAR 14GX15 (MISCELLANEOUS) IMPLANT
IRRIGATION SUCT STRKRFLW 2 WTP (MISCELLANEOUS) ×1 IMPLANT
KIT BASIN OR (CUSTOM PROCEDURE TRAY) ×1 IMPLANT
KIT IMAGING PINPOINTPAQ (MISCELLANEOUS) IMPLANT
KIT TURNOVER KIT B (KITS) ×1 IMPLANT
NS IRRIG 1000ML POUR BTL (IV SOLUTION) ×1 IMPLANT
PAD ARMBOARD POSITIONER FOAM (MISCELLANEOUS) ×1 IMPLANT
POUCH RETRIEVAL ECOSAC 10 (ENDOMECHANICALS) ×1 IMPLANT
SCISSORS LAP 5X35 DISP (ENDOMECHANICALS) ×1 IMPLANT
SET TUBE SMOKE EVAC HIGH FLOW (TUBING) ×1 IMPLANT
SLEEVE ADV FIXATION 5X100MM (TROCAR) ×2 IMPLANT
SPECIMEN JAR SMALL (MISCELLANEOUS) ×1 IMPLANT
SUT MNCRL AB 4-0 PS2 18 (SUTURE) ×1 IMPLANT
SUT VICRYL 0 UR6 27IN ABS (SUTURE) IMPLANT
SUT VICRYL AB 3 0 TIES (SUTURE) IMPLANT
SYSTEM BAG RETRIEVAL 10MM (BASKET) IMPLANT
TOWEL GREEN STERILE (TOWEL DISPOSABLE) ×1 IMPLANT
TOWEL GREEN STERILE FF (TOWEL DISPOSABLE) ×1 IMPLANT
TRAY LAPAROSCOPIC MC (CUSTOM PROCEDURE TRAY) ×1 IMPLANT
TROCAR ADV FIXATION 5X100MM (TROCAR) ×1 IMPLANT
TROCAR BALLN 12MMX100 BLUNT (TROCAR) ×1 IMPLANT
WARMER LAPAROSCOPE (MISCELLANEOUS) ×1 IMPLANT
WATER STERILE IRR 1000ML POUR (IV SOLUTION) ×1 IMPLANT

## 2024-01-01 NOTE — Anesthesia Postprocedure Evaluation (Signed)
 Anesthesia Post Note  Patient: Molly Archer  Procedure(s) Performed: LAPAROSCOPIC CHOLECYSTECTOMY (Abdomen)     Patient location during evaluation: PACU Anesthesia Type: General Level of consciousness: awake and alert Pain management: pain level controlled Vital Signs Assessment: post-procedure vital signs reviewed and stable Respiratory status: spontaneous breathing, nonlabored ventilation and respiratory function stable Cardiovascular status: blood pressure returned to baseline Postop Assessment: no apparent nausea or vomiting Anesthetic complications: no   No notable events documented.  Last Vitals:  Vitals:   01/01/24 1230 01/01/24 1300  BP: (!) 146/71 (!) 151/82  Pulse:    Resp:    Temp:  36.7 C  SpO2:      Last Pain:  Vitals:   01/01/24 1230  TempSrc:   PainSc: 0-No pain                 Rayfield Cairo

## 2024-01-01 NOTE — Op Note (Signed)
 Molly Archer [id number] [birth year] 01/01/2024  Laparoscopic Cholecystectomy with near infrared fluorescent cholangiography procedure Note  Indications: This patient presents with symptomatic gallbladder disease and will undergo laparoscopic cholecystectomy. The patient had imaging suggesting biliary dyskinesia.  She had a very low ejection fraction.  Pre-operative Diagnosis: biliary dyskinesia  Post-operative Diagnosis: same, fatty liver  Surgeon: Aldean Hummingbird MD FACS  Assistants: none  Anesthesia: General endotracheal anesthesia   Procedure Details  The patient was seen again in the Holding Room. The risks, benefits, complications, treatment options, and expected outcomes were discussed with the patient. The possibilities of reaction to medication, pulmonary aspiration, perforation of viscus, bleeding, recurrent infection, finding a normal gallbladder, the need for additional procedures, failure to diagnose a condition, the possible need to convert to an open procedure, and creating a complication requiring transfusion or operation were discussed with the patient. The likelihood of improving the patient's symptoms with return to their baseline status is good.  The patient and/or family concurred with the proposed plan, giving informed consent. The site of surgery properly noted. The patient was taken to Operating Room, identified as Molly Archer and the procedure verified as Laparoscopic Cholecystectomy with ICG dye.  A Time Out was held and the above information confirmed. Antibiotic prophylaxis was administered.    ICG dye was administered preoperatively.    General endotracheal anesthesia was then administered and tolerated well. After the induction, the abdomen was prepped with Chloraprep and draped in the sterile fashion. The patient was positioned in the supine position.  Given the patient's severe obesity, Access to the abdomen was obtained via the Optiview technique.  A  small incision was made to the left of the midline just below the subcostal margin.  Then using a 0 degree 5 mm laparoscope through a 5 mm trocar the laparoscope was advanced through all layers of the abdominal wall and carefully entered the abdominal cavity.  Pneumoperitoneum was smoothly established up to a patient pressure of 15 mmHg without any change in patient vital signs.  The laparoscope was advanced in the abdominal cavity was surveilled.  There is no evidence of injury to surrounding structures.  There was a small scratch in the liver capsule but no penetration of the liver parenchyma.  There was no bleeding in that location.  We positioned the patient in reverse Trendelenburg, tilted slightly to the patient's left.  A 5 mm port was placed in the umbilical position under direct visualization.  The optical entry trocar was exchanged for a 12 mm trocar.  Two 5-mm ports were placed in the right upper quadrant. All skin incisions were infiltrated with a local anesthetic agent before making the incision and placing the trocars.     The gallbladder was identified, the fundus grasped and retracted cephalad.  The patient had pretty impressive hepatic steatosis.  She had a very large liver.  The gallbladder was essentially encased in adipose tissue.  Adhesions were lysed bluntly and with the electrocautery where indicated, taking care not to injure any adjacent organs or viscus. The infundibulum was grasped and retracted laterally, exposing the peritoneum overlying the triangle of Calot. This was then divided and exposed in a blunt fashion. A critical view of the cystic duct and cystic artery (both an anterior and posterior branch) was obtained.  The cystic duct was clearly identified and bluntly dissected circumferentially.  Utilizing the Stryker camera system near infrared fluorescent activity was visualized in the liver, cystic duct, and no other structures.  The patient had  a significant amount of adipose  tissue medial and overlying the portal areas so I think that is why we could not visualize fluorescent activity in the common hepatic duct, common bile duct and small bowel.  This served as a secondary confirmation of our anatomy.  The cystic duct was then ligated with clips and divided. The cystic artery (both an anterior and posterior branch) which had been identified & dissected free were ligated with clips and divided as well.   The gallbladder was dissected from the liver bed in retrograde fashion with the electrocautery. The gallbladder was removed and placed in an specimen retrieval bag.  The liver bed was irrigated and inspected. Hemostasis was achieved with the electrocautery. Copious irrigation was utilized and was repeatedly aspirated until clear.  I reinspected the liver the left lobe and that was normal and not bleeding.  I did put some electrocautery on that location just to be safe.  The gallbladder and Ecco sac were then removed through the left upper quadrant port site..    We again inspected the right upper quadrant for hemostasis.  The left upper quadrant port site was closed with 0 Vicryl x2 using a PMI suture passer with laparoscopic guidance.  The left upper quadrant closure was inspected and there was no air leak and nothing trapped within the closure. Pneumoperitoneum was released as we removed the trocars.  4-0 Monocryl was used to close the skin.   Dermabond was applied. The patient was then extubated and brought to the recovery room in stable condition. Instrument, sponge, and needle counts were correct at closure and at the conclusion of the case.   Findings: Severe hepatic steatosis Able to identify the cystic duct and the cystic artery Fluorescent cholangiography demonstrated in the liver, gallbladder, and cystic duct  Estimated Blood Loss: Minimal         Drains: none         Specimens: Gallbladder           Complications: None; patient tolerated the procedure  well.         Disposition: PACU - hemodynamically stable.         Condition: stable  Marianna Shirk. Elvan Hamel, MD, FACS General, Bariatric, & Minimally Invasive Surgery [facility],  A [facility]

## 2024-01-01 NOTE — Transfer of Care (Signed)
 Immediate Anesthesia Transfer of Care Note  Patient: Molly Archer  Procedure(s) Performed: LAPAROSCOPIC CHOLECYSTECTOMY (Abdomen)  Patient Location: PACU  Anesthesia Type:General  Level of Consciousness: patient cooperative and responds to stimulation  Airway & Oxygen Therapy: Patient Spontanous Breathing and Patient connected to nasal cannula oxygen  Post-op Assessment: Report given to RN, Post -op Vital signs reviewed and stable, and Patient moving all extremities X 4  Post vital signs: Reviewed and stable  Last Vitals:  Vitals Value Taken Time  BP 141/81 01/01/24 1206  Temp    Pulse 85 01/01/24 1209  Resp 24 01/01/24 1209  SpO2 89 % 01/01/24 1209  Vitals shown include unfiled device data.  Last Pain:  Vitals:   01/01/24 0835  TempSrc:   PainSc: 7       Patients Stated Pain Goal: 3 (01/01/24 0835)  Complications: No notable events documented.

## 2024-01-01 NOTE — Anesthesia Procedure Notes (Addendum)
 Procedure Name: Intubation Date/Time: 01/01/2024 10:12 AM  Performed by: Philemon Braver, CRNAPre-anesthesia Checklist: Patient identified, Emergency Drugs available, Suction available and Patient being monitored Patient Re-evaluated:Patient Re-evaluated prior to induction Oxygen Delivery Method: Circle System Utilized Preoxygenation: Pre-oxygenation with 100% oxygen Induction Type: IV induction Ventilation: Two handed mask ventilation required Laryngoscope Size: Mac and 3 Grade View: Grade I Tube type: Oral Tube size: 7.0 mm Number of attempts: 1 Airway Equipment and Method: Stylet and Video-laryngoscopy Placement Confirmation: ETT inserted through vocal cords under direct vision, positive ETCO2 and breath sounds checked- equal and bilateral Secured at: 21 cm Tube secured with: Tape Dental Injury: Teeth and Oropharynx as per pre-operative assessment  Difficulty Due To: Difficulty was anticipated, Difficult Airway- due to large tongue, Difficult Airway- due to reduced neck mobility and Difficult Airway-  due to edematous airway Future Recommendations: Recommend- induction with short-acting agent, and alternative techniques readily available Comments: Head/neck in midline and neutral position for induction and intubation. Successful, atraumatic intubation with Glidescope MAC3.

## 2024-01-01 NOTE — Discharge Instructions (Addendum)
 LAPAROSCOPIC SURGERY: POST OP INSTRUCTIONS Always review your discharge instruction sheet given to you by the facility where your surgery was performed. IF YOU HAVE DISABILITY OR FAMILY LEAVE FORMS, YOU MUST BRING THEM TO THE OFFICE FOR PROCESSING.   DO NOT GIVE THEM TO YOUR DOCTOR.  PAIN CONTROL  First take acetaminophen  (Tylenol ) AND/or ibuprofen  (Advil ) to control your pain after surgery.  Follow directions on package.  Taking acetaminophen  (Tylenol ) and/or ibuprofen  (Advil ) regularly after surgery will help to control your pain and lower the amount of prescription pain medication you may need.  You should not take more than 3,000 mg (3 grams) of acetaminophen  (Tylenol ) in 24 hours.  You should not take ibuprofen  (Advil ), aleve , motrin , naprosyn  or other NSAIDS if you have a history of stomach ulcers or chronic kidney disease.  A prescription for pain medication may be given to you upon discharge.  Take your pain medication as prescribed, if you still have uncontrolled pain after taking acetaminophen  (Tylenol ) or ibuprofen  (Advil ). Use ice packs to help control pain. If you need a refill on your pain medication, please contact your pharmacy.  They will contact our office to request authorization. Prescriptions will not be filled after 5pm or on week-ends.  HOME MEDICATIONS Take your usually prescribed medications unless otherwise directed.  DIET You should follow a light diet the first few days after arrival home.  Be sure to include lots of fluids daily. Avoid fatty, fried foods.   CONSTIPATION It is common to experience some constipation after surgery and if you are taking pain medication.  Increasing fluid intake and taking a stool softener (such as Colace) will usually help or prevent this problem from occurring.  A mild laxative (Milk of Magnesia or Miralax ) should be taken according to package instructions if there are no bowel movements after 48 hours.  WOUND/INCISION CARE Most  patients will experience some swelling and bruising in the area of the incisions.  Ice packs will help.  Swelling and bruising can take several days to resolve.  Unless discharge instructions indicate otherwise, follow guidelines below  STERI-STRIPS - you may remove your outer bandages 48 hours after surgery, and you may shower at that time.  You have steri-strips (small skin tapes) in place directly over the incision.  These strips should be left on the skin for 7-10 days.   DERMABOND/SKIN GLUE - you may shower in 24 hours.  The glue will flake off over the next 2-3 weeks. Any sutures or staples will be removed at the office during your follow-up visit.  ACTIVITIES You may resume regular (light) daily activities beginning the next day--such as daily self-care, walking, climbing stairs--gradually increasing activities as tolerated.  You may have sexual intercourse when it is comfortable.  Refrain from any heavy lifting or straining until approved by your doctor. You may drive when you are no longer taking prescription pain medication, you can comfortably wear a seatbelt, and you can safely maneuver your car and apply brakes.  FOLLOW-UP You should see your doctor in the office for a follow-up appointment approximately 2-3 weeks after your surgery.  You should have been given your post-op/follow-up appointment when your surgery was scheduled.  If you did not receive a post-op/follow-up appointment, make sure that you call for this appointment within a day or two after you arrive home to insure a convenient appointment time.  OTHER INSTRUCTIONS   WHEN TO CALL YOUR DOCTOR: Fever over 101.0 Inability to urinate Continued bleeding from incision. Increased pain,  redness, or drainage from the incision. Increasing abdominal pain  The clinic staff is available to answer your questions during regular business hours.  Please don't hesitate to call and ask to speak to one of the nurses for clinical  concerns.  If you have a medical emergency, go to the nearest emergency room or call 911.  A surgeon from [facility] is always on call at the hospital. [address], [place], [place]  [address] ? [address] [address], [place], [place]   [address] [phone] ? [phone] ? FAX [phone] Web site: [email or web address]  Post Anesthesia Home Care Instructions  Activity: Get plenty of rest for the remainder of the day. A responsible individual must stay with you for 24 hours following the procedure.  For the next 24 hours, DO NOT: -Drive a car -Advertising copywriter -Drink alcoholic beverages -Take any medication unless instructed by your physician -Make any legal decisions or sign important papers.  Meals: Start with liquid foods such as gelatin or soup. Progress to regular foods as tolerated. Avoid greasy, spicy, heavy foods. If nausea and/or vomiting occur, drink only clear liquids until the nausea and/or vomiting subsides. Call your physician if vomiting continues.  Special Instructions/Symptoms: Your throat may feel dry or sore from the anesthesia or the breathing tube placed in your throat during surgery. If this causes discomfort, gargle with warm salt water. The discomfort should disappear within 24 hours.
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
## 2024-01-02 ENCOUNTER — Inpatient Hospital Stay (HOSPITAL_BASED_OUTPATIENT_CLINIC_OR_DEPARTMENT_OTHER)
Admission: EM | Admit: 2024-01-02 | Discharge: 2024-01-05 | DRG: 988 | Disposition: A | Discharge status: Home / Self Care | Attending: Internal Medicine | Admitting: Internal Medicine

## 2024-01-02 ENCOUNTER — Emergency Department (HOSPITAL_BASED_OUTPATIENT_CLINIC_OR_DEPARTMENT_OTHER)

## 2024-01-02 ENCOUNTER — Encounter (HOSPITAL_COMMUNITY): Payer: Self-pay | Admitting: General Surgery

## 2024-01-02 ENCOUNTER — Emergency Department (HOSPITAL_BASED_OUTPATIENT_CLINIC_OR_DEPARTMENT_OTHER): Admitting: Radiology

## 2024-01-02 ENCOUNTER — Other Ambulatory Visit: Payer: Self-pay

## 2024-01-02 ENCOUNTER — Inpatient Hospital Stay (HOSPITAL_COMMUNITY)

## 2024-01-02 DIAGNOSIS — Z5986 Financial insecurity: Secondary | ICD-10-CM | POA: Diagnosis not present

## 2024-01-02 DIAGNOSIS — K760 Fatty (change of) liver, not elsewhere classified: Secondary | ICD-10-CM | POA: Diagnosis present

## 2024-01-02 DIAGNOSIS — E1165 Type 2 diabetes mellitus with hyperglycemia: Secondary | ICD-10-CM | POA: Diagnosis present

## 2024-01-02 DIAGNOSIS — E66813 Obesity, class 3: Secondary | ICD-10-CM | POA: Diagnosis present

## 2024-01-02 DIAGNOSIS — Z8249 Family history of ischemic heart disease and other diseases of the circulatory system: Secondary | ICD-10-CM

## 2024-01-02 DIAGNOSIS — Z885 Allergy status to narcotic agent status: Secondary | ICD-10-CM

## 2024-01-02 DIAGNOSIS — J939 Pneumothorax, unspecified: Secondary | ICD-10-CM | POA: Diagnosis present

## 2024-01-02 DIAGNOSIS — Z881 Allergy status to other antibiotic agents status: Secondary | ICD-10-CM | POA: Diagnosis not present

## 2024-01-02 DIAGNOSIS — J9383 Other pneumothorax: Secondary | ICD-10-CM | POA: Diagnosis present

## 2024-01-02 DIAGNOSIS — E559 Vitamin D deficiency, unspecified: Secondary | ICD-10-CM | POA: Diagnosis present

## 2024-01-02 DIAGNOSIS — J95811 Postprocedural pneumothorax: Secondary | ICD-10-CM | POA: Diagnosis not present

## 2024-01-02 DIAGNOSIS — Z833 Family history of diabetes mellitus: Secondary | ICD-10-CM | POA: Diagnosis not present

## 2024-01-02 DIAGNOSIS — Z6841 Body Mass Index (BMI) 40.0 and over, adult: Secondary | ICD-10-CM | POA: Diagnosis not present

## 2024-01-02 DIAGNOSIS — K5900 Constipation, unspecified: Secondary | ICD-10-CM | POA: Diagnosis present

## 2024-01-02 DIAGNOSIS — G8929 Other chronic pain: Secondary | ICD-10-CM | POA: Diagnosis present

## 2024-01-02 DIAGNOSIS — Z5941 Food insecurity: Secondary | ICD-10-CM

## 2024-01-02 DIAGNOSIS — Z880 Allergy status to penicillin: Secondary | ICD-10-CM

## 2024-01-02 DIAGNOSIS — Z83438 Family history of other disorder of lipoprotein metabolism and other lipidemia: Secondary | ICD-10-CM

## 2024-01-02 DIAGNOSIS — I10 Essential (primary) hypertension: Secondary | ICD-10-CM | POA: Diagnosis present

## 2024-01-02 DIAGNOSIS — Z7984 Long term (current) use of oral hypoglycemic drugs: Secondary | ICD-10-CM

## 2024-01-02 DIAGNOSIS — Z7401 Bed confinement status: Secondary | ICD-10-CM | POA: Diagnosis not present

## 2024-01-02 DIAGNOSIS — G4733 Obstructive sleep apnea (adult) (pediatric): Secondary | ICD-10-CM | POA: Diagnosis present

## 2024-01-02 DIAGNOSIS — M1990 Unspecified osteoarthritis, unspecified site: Secondary | ICD-10-CM | POA: Diagnosis present

## 2024-01-02 DIAGNOSIS — Z79899 Other long term (current) drug therapy: Secondary | ICD-10-CM | POA: Diagnosis not present

## 2024-01-02 DIAGNOSIS — J9311 Primary spontaneous pneumothorax: Secondary | ICD-10-CM | POA: Diagnosis present

## 2024-01-02 DIAGNOSIS — E878 Other disorders of electrolyte and fluid balance, not elsewhere classified: Secondary | ICD-10-CM | POA: Diagnosis present

## 2024-01-02 DIAGNOSIS — L409 Psoriasis, unspecified: Secondary | ICD-10-CM | POA: Diagnosis present

## 2024-01-02 DIAGNOSIS — R0689 Other abnormalities of breathing: Secondary | ICD-10-CM | POA: Diagnosis not present

## 2024-01-02 DIAGNOSIS — K8010 Calculus of gallbladder with chronic cholecystitis without obstruction: Secondary | ICD-10-CM | POA: Diagnosis present

## 2024-01-02 DIAGNOSIS — Z823 Family history of stroke: Secondary | ICD-10-CM

## 2024-01-02 DIAGNOSIS — Z5948 Other specified lack of adequate food: Secondary | ICD-10-CM

## 2024-01-02 DIAGNOSIS — Z79891 Long term (current) use of opiate analgesic: Secondary | ICD-10-CM

## 2024-01-02 DIAGNOSIS — R0602 Shortness of breath: Secondary | ICD-10-CM | POA: Diagnosis present

## 2024-01-02 DIAGNOSIS — E119 Type 2 diabetes mellitus without complications: Secondary | ICD-10-CM

## 2024-01-02 LAB — CBC WITH DIFFERENTIAL/PLATELET
Abs Immature Granulocytes: 0.07 10*3/uL (ref 0.00–0.07)
Basophils Absolute: 0 10*3/uL (ref 0.0–0.1)
Basophils Relative: 0 %
Eosinophils Absolute: 0 10*3/uL (ref 0.0–0.5)
Eosinophils Relative: 0 %
HCT: 39.9 % (ref 36.0–46.0)
Hemoglobin: 12.9 g/dL (ref 12.0–15.0)
Immature Granulocytes: 1 %
Lymphocytes Relative: 12 %
Lymphs Abs: 1.1 10*3/uL (ref 0.7–4.0)
MCH: 28.4 pg (ref 26.0–34.0)
MCHC: 32.3 g/dL (ref 30.0–36.0)
MCV: 87.7 fL (ref 80.0–100.0)
Monocytes Absolute: 0.8 10*3/uL (ref 0.1–1.0)
Monocytes Relative: 9 %
Neutro Abs: 7.1 10*3/uL (ref 1.7–7.7)
Neutrophils Relative %: 78 %
Platelets: 401 10*3/uL — ABNORMAL HIGH (ref 150–400)
RBC: 4.55 MIL/uL (ref 3.87–5.11)
RDW: 14.9 % (ref 11.5–15.5)
WBC: 9 10*3/uL (ref 4.0–10.5)
nRBC: 0 % (ref 0.0–0.2)

## 2024-01-02 LAB — LIPASE, BLOOD: Lipase: 15 U/L (ref 11–51)

## 2024-01-02 LAB — COMPREHENSIVE METABOLIC PANEL WITH GFR
ALT: 48 U/L — ABNORMAL HIGH (ref 0–44)
AST: 24 U/L (ref 15–41)
Albumin: 4.1 g/dL (ref 3.5–5.0)
Alkaline Phosphatase: 40 U/L (ref 38–126)
Anion gap: 15 (ref 5–15)
BUN: 13 mg/dL (ref 6–20)
CO2: 24 mmol/L (ref 22–32)
Calcium: 9.8 mg/dL (ref 8.9–10.3)
Chloride: 97 mmol/L — ABNORMAL LOW (ref 98–111)
Creatinine, Ser: 0.7 mg/dL (ref 0.44–1.00)
GFR, Estimated: >60 mL/min (ref >60)
Glucose, Bld: 272 mg/dL — ABNORMAL HIGH (ref 70–99)
Potassium: 3.9 mmol/L (ref 3.5–5.1)
Sodium: 136 mmol/L (ref 135–145)
Total Bilirubin: 0.4 mg/dL (ref 0.0–1.2)
Total Protein: 7.3 g/dL (ref 6.5–8.1)

## 2024-01-02 LAB — SURGICAL PATHOLOGY

## 2024-01-02 LAB — TROPONIN T, HIGH SENSITIVITY: Troponin T High Sensitivity: <15 ng/L (ref <19)

## 2024-01-02 LAB — GLUCOSE, CAPILLARY: Glucose-Capillary: 232 mg/dL — ABNORMAL HIGH (ref 70–99)

## 2024-01-02 MED ORDER — DOXAZOSIN MESYLATE 1 MG PO TABS
1.0000 mg | ORAL_TABLET | Freq: Every day | ORAL | Status: DC
  Administered 2024-01-03 – 2024-01-05 (×3): 1 mg via ORAL
  Filled 2024-01-02 (×3): qty 1

## 2024-01-02 MED ORDER — FENTANYL CITRATE PF 50 MCG/ML IJ SOSY
50.0000 ug | PREFILLED_SYRINGE | Freq: Once | INTRAMUSCULAR | Status: AC
  Administered 2024-01-02: 50 ug via INTRAVENOUS
  Filled 2024-01-02: qty 1

## 2024-01-02 MED ORDER — IRBESARTAN 300 MG PO TABS
300.0000 mg | ORAL_TABLET | Freq: Every day | ORAL | Status: DC
  Administered 2024-01-03 – 2024-01-05 (×3): 300 mg via ORAL
  Filled 2024-01-02 (×3): qty 1

## 2024-01-02 MED ORDER — FENTANYL CITRATE PF 50 MCG/ML IJ SOSY
100.0000 ug | PREFILLED_SYRINGE | Freq: Once | INTRAMUSCULAR | Status: AC
  Administered 2024-01-02: 100 ug via INTRAVENOUS
  Filled 2024-01-02: qty 2

## 2024-01-02 MED ORDER — OXYCODONE HCL 5 MG PO TABS: 10.0000 mg | ORAL_TABLET | Freq: Four times a day (QID) | ORAL | Status: DC | PRN

## 2024-01-02 MED ORDER — SODIUM CHLORIDE 0.9% FLUSH
10.0000 mL | Freq: Three times a day (TID) | INTRAVENOUS | Status: DC
  Administered 2024-01-03: 10 mL via INTRAPLEURAL

## 2024-01-02 MED ORDER — LIDOCAINE-EPINEPHRINE (PF) 2 %-1:200000 IJ SOLN
20.0000 mL | Freq: Once | INTRAMUSCULAR | Status: AC
  Administered 2024-01-02: 20 mL
  Filled 2024-01-02: qty 20

## 2024-01-02 MED ORDER — IPRATROPIUM-ALBUTEROL 0.5-2.5 (3) MG/3ML IN SOLN
RESPIRATORY_TRACT | Status: AC
  Filled 2024-01-02: qty 3

## 2024-01-02 MED ORDER — SODIUM CHLORIDE 0.9% FLUSH
3.0000 mL | Freq: Two times a day (BID) | INTRAVENOUS | Status: DC
  Administered 2024-01-03 – 2024-01-05 (×6): 3 mL via INTRAVENOUS

## 2024-01-02 MED ORDER — NIFEDIPINE ER OSMOTIC RELEASE 60 MG PO TB24
60.0000 mg | ORAL_TABLET | Freq: Every day | ORAL | Status: DC
  Administered 2024-01-03 – 2024-01-05 (×3): 60 mg via ORAL
  Filled 2024-01-02 (×3): qty 1

## 2024-01-02 MED ORDER — INSULIN ASPART 100 UNIT/ML IJ SOLN
0.0000 [IU] | Freq: Every day | INTRAMUSCULAR | Status: DC
  Administered 2024-01-03: 2 [IU] via SUBCUTANEOUS

## 2024-01-02 MED ORDER — SODIUM CHLORIDE 0.9 % IV SOLN
500.0000 mg | Freq: Once | INTRAVENOUS | Status: AC
  Administered 2024-01-03: 500 mg via INTRAVENOUS
  Filled 2024-01-02: qty 5

## 2024-01-02 MED ORDER — ALBUTEROL SULFATE (2.5 MG/3ML) 0.083% IN NEBU: 2.5000 mg | INHALATION_SOLUTION | Freq: Four times a day (QID) | RESPIRATORY_TRACT | Status: DC | PRN

## 2024-01-02 MED ORDER — IOHEXOL 300 MG/ML  SOLN
100.0000 mL | Freq: Once | INTRAMUSCULAR | Status: AC | PRN
  Administered 2024-01-02: 100 mL via INTRAVENOUS

## 2024-01-02 MED ORDER — TIZANIDINE HCL 4 MG PO TABS: 4.0000 mg | ORAL_TABLET | Freq: Three times a day (TID) | ORAL | Status: DC | PRN

## 2024-01-02 MED ORDER — SODIUM CHLORIDE 0.9 % IV SOLN
1.0000 g | Freq: Once | INTRAVENOUS | Status: AC
  Administered 2024-01-02: 1 g via INTRAVENOUS
  Filled 2024-01-02: qty 10

## 2024-01-02 MED ORDER — IPRATROPIUM-ALBUTEROL 0.5-2.5 (3) MG/3ML IN SOLN
3.0000 mL | Freq: Once | RESPIRATORY_TRACT | Status: AC
  Administered 2024-01-02: 3 mL via RESPIRATORY_TRACT

## 2024-01-02 MED ORDER — INSULIN ASPART 100 UNIT/ML IJ SOLN
0.0000 [IU] | Freq: Three times a day (TID) | INTRAMUSCULAR | Status: DC
  Administered 2024-01-03: 2 [IU] via SUBCUTANEOUS
  Administered 2024-01-03: 1 [IU] via SUBCUTANEOUS
  Administered 2024-01-04: 3 [IU] via SUBCUTANEOUS

## 2024-01-02 MED ORDER — HYDROMORPHONE HCL 1 MG/ML IJ SOLN: 0.5000 mg | INTRAMUSCULAR | Status: DC | PRN

## 2024-01-02 NOTE — ED Notes (Addendum)
 Pt placed on 2L of oxygen due to SOB and O2 sats in low 90s on room air.

## 2024-01-02 NOTE — ED Triage Notes (Signed)
 Sob since this AM Yesterday gallbladder removed  cough

## 2024-01-02 NOTE — Plan of Care (Signed)
 Plan of Care Note for accepted transfer   Patient name: Molly Archer ZOX:[id number] DOB: Jan 05, 1981  Facility requesting transfer: [facility] Requesting Provider: Dr. Tamela Fake Facility course: 43 year old female with history of asthma, hypertension, type 2 diabetes, chronic pain syndrome, psoriasis, laparoscopic cholecystectomy yesterday presented to the ED with shortness of breath and chest tightness. Oxygen saturation in the low 90s on room air and placed on 2 L [place].  EKG showing sinus rhythm and no STEMI.  Troponin negative.  Chest x-ray showing moderate to large right pneumothorax with associated collapse of the right lung but no mediastinal shift.  Patient underwent chest tube placement.  Follow-up CT showing small right pneumothorax and collapse or consolidation of the right lung, possibly compressive atelectasis or pneumonia.  Patient was started on antibiotics.  ED physician discussed the case with Dr. Elvan Hamel with general surgery who will see the patient in consultation.  Also case was discussed with PCCM and they will see the patient in consultation for chest tube management.  Plan of care: The patient is accepted for admission to Progressive unit at [facility].  [facility] will assume care on arrival to accepting facility. Until arrival, care as per EDP. However, [facility] available 24/7 for questions and assistance.  Check [email or web address] for on-call coverage.  Nursing staff, please call [facility] Admits & Consults System-Wide number under Amion on patient's arrival so appropriate admitting provider can evaluate the pt.

## 2024-01-02 NOTE — ED Notes (Addendum)
 RT Note: RT at the [facility] entire procedure for chest tube placement. Ambu bag, and suction connected and placed. Suction pressure set on 20. Patient tolerated well with SPO2 95% on 4lpm for procedure.

## 2024-01-02 NOTE — ED Provider Notes (Signed)
 [place] EMERGENCY DEPARTMENT AT [facility] Provider Note   CSN: [id number] Arrival date & time: 01/02/24  1447     History Chief Complaint  Patient presents with   Shortness of Breath   Post-op Problem    Molly Archer is a 43 y.o. female patient with history of asthma and status post cholecystectomy within the last 24 hours who presents to the emergency department today with shortness of breath.  Symptoms started this morning.  She reports associated chest tightness.  Does endorse also some abdominal pain from surgery with some mild bleeding from the incision sites.  No purulent drainage.  She denies fever, chills, nausea, vomiting, diarrhea, leg swelling or pain.   Shortness of Breath      Home Medications Prior to Admission medications   Medication Sig Start Date End Date Taking? Authorizing Provider  acetaminophen  (TYLENOL ) 500 MG tablet Take 2 tablets (1,000 mg total) by mouth every 8 (eight) hours for 5 days. 01/01/24 01/06/24  Aldean Hummingbird, MD  albuterol  (VENTOLIN  HFA) [phone] Base) MCG/ACT inhaler Inhale 2 puffs into the lungs every 4 (four) hours as needed for wheezing or shortness of breath (cough, shortness of breath or wheezing.). Patient not taking: Reported on 12/26/2023 10/14/23   de [place], Alonza Jansky, MD  albuterol  (VENTOLIN  HFA) 108 [phone] Base) MCG/ACT inhaler Inhale 2 puffs into the lungs every 4 (four) hours as needed for wheezing or shortness of breath (cough, shortness of breath or wheezing.). 10/14/23   [place], Alonza Jansky, MD  Ciclopirox  1 % shampoo Apply 1 each topically at bedtime. 06/18/23   [place], Raymond J, MD  clobetasol  ointment (TEMOVATE ) 0.05 % Apply topically 2 (two) times daily. Patient taking differently: Apply 1 Application topically 2 (two) times daily as needed (psoriasis). 12/24/23   [place], Alonza Jansky, MD  diclofenac  (VOLTAREN ) 75 MG EC tablet Take 1 tablet (75 mg total) by mouth 2 (two) times daily with a meal. Patient taking  differently: Take 75 mg by mouth 2 (two) times daily as needed for moderate pain (pain score 4-6). 12/26/23   Rawland Caddy, MD  doxazosin  (CARDURA ) 1 MG tablet Take 1 tablet (1 mg total) by mouth daily. 12/23/23   Caudle, Arcola Kocher, FNP  Fluocinolone  Acetonide Scalp 0.01 % OIL Apply 1 Application topically 2 (two) times a week. 08/22/23   Caudle, Arcola Kocher, FNP  glipiZIDE  (GLUCOTROL  XL) 10 MG 24 hr tablet Take 1 tablet (10 mg total) by mouth daily. 11/19/23   [place], Alonza Jansky, MD  glucose blood (PRECISION QID TEST) test strip Check glucose twice daily as needed. 09/19/22   [provider]  hydrochlorothiazide  (MICROZIDE ) 12.5 MG capsule Take 1 capsule (12.5 mg total) by mouth daily. 11/19/23   [place], Alonza Jansky, MD  ipratropium-albuterol  (DUONEB) 0.5-2.5 (3) MG/3ML SOLN Inhale 3 mLs into the lungs every 6 (six) hours as needed. Patient not taking: Reported on 12/26/2023 07/09/23   Wilhelmena Hanson, FNP  irbesartan -hydrochlorothiazide  (AVALIDE) 300-12.5 MG tablet Take 1 tablet by mouth daily. 11/19/23   [place], Alonza Jansky, MD  lidocaine  (XYLOCAINE ) 2 % solution Use as directed 15 mLs in the mouth or throat every 4 (four) hours as needed. 12/09/23     Naftifine  HCl 2 % CREA Apply liberally to affected area daily Patient taking differently: Apply 1 Application topically daily as needed (dry feet). 12/24/23   [place], Alonza Jansky, MD  Naphazoline-Pheniramine (ALLERGY EYE OP) Place 1 drop into both eyes daily  as needed (allergies).    [provider]  NIFEdipine  (PROCARDIA  XL/NIFEDICAL XL) 60 MG 24 hr tablet Take 1 tablet (60 mg total) by mouth daily. 11/19/23   [place], Alonza Jansky, MD  norethindrone  (AYGESTIN ) 5 MG tablet Take 3 tablets (15 mg total) by mouth daily. 10/22/18   Anyanwu, Ugonna A, MD  Oxycodone  HCl 10 MG TABS Take 1 tablet (10 mg total) by mouth every 8 (eight) hours as needed. 12/30/23   Rawland Caddy, MD  tiZANidine  (ZANAFLEX ) 4 MG tablet Take 1 tablet (4 mg total)  by mouth every 8 (eight) hours as needed for muscle spasms. 12/03/23   [place], Raymond J, MD  topiramate  (TOPAMAX ) 50 MG tablet Take 1/2 tablet at bedtime for one week, then increase to 1 tablet at bedtime Patient not taking: Reported on 12/26/2023 07/30/23   Merriam Abbey, DO  triamcinolone  cream (KENALOG ) 0.1 % Apply 1 Application topically 2 (two) times daily. Patient taking differently: Apply 1 Application topically 2 (two) times daily as needed (dry skin). 12/24/23   [place], Raymond J, MD  zolpidem  (AMBIEN ) 5 MG tablet Take 1 tablet (5 mg total) by mouth at bedtime as needed for sleep. 12/03/23   [place], Raymond J, MD      Allergies    Codeine, Doxycycline , Losartan, Penicillins, and Tramadol     Review of Systems   Review of Systems  Respiratory:  Positive for shortness of breath.   All other systems reviewed and are negative.   Physical Exam Updated Vital Signs BP (!) 152/100   Pulse 84   Temp 98 F (36.7 C) (Oral)   Resp (!) 22   LMP 06/30/2018 (Exact Date)   SpO2 96%  Physical Exam Vitals and nursing note reviewed.  Constitutional:      General: She is not in acute distress.    Appearance: Normal appearance. She is obese.  HENT:     Head: Normocephalic and atraumatic.  Eyes:     General:        Right eye: No discharge.        Left eye: No discharge.  Cardiovascular:     Comments: Regular rate and rhythm.  S1/S2 are distinct without any evidence of murmur, rubs, or gallops.  Radial pulses are 2+ bilaterally.  Dorsalis pedis pulses are 2+ bilaterally.  No evidence of pedal edema. Pulmonary:     Comments: Decreased breath sounds on the right in comparison to the left.  There is some scant wheezing in the left. Abdominal:     General: Abdomen is flat. Bowel sounds are normal. There is no distension.     Tenderness: There is no abdominal tenderness. There is no guarding or rebound.  Musculoskeletal:        General: Normal range of motion.     Cervical back: Neck supple.   Skin:    General: Skin is warm and dry.     Findings: No rash.  Neurological:     General: No focal deficit present.     Mental Status: She is alert.  Psychiatric:        Mood and Affect: Mood normal.        Behavior: Behavior normal.     ED Results / Procedures / Treatments   Labs (all labs ordered are listed, but only abnormal results are displayed) Labs Reviewed  CBC WITH DIFFERENTIAL/PLATELET - Abnormal; Notable for the following components:      Result Value   Platelets 401 (*)  All other components within normal limits  COMPREHENSIVE METABOLIC PANEL WITH GFR - Abnormal; Notable for the following components:   Chloride 97 (*)    Glucose, Bld 272 (*)    ALT 48 (*)    All other components within normal limits  LIPASE, BLOOD  TROPONIN T, HIGH SENSITIVITY  TROPONIN T, HIGH SENSITIVITY    EKG EKG Interpretation Date/Time:  Thursday January 02 2024 15:00:58 EDT Ventricular Rate:  90 PR Interval:  147 QRS Duration:  79 QT Interval:  327 QTC Calculation: 400 R Axis:   85  Text Interpretation: Sinus rhythm Borderline T wave abnormalities No significant change since last tracing Confirmed by Scarlette Currier ([id number]) on 01/02/2024 7:33:51 PM  Radiology CT Chest W Contrast Result Date: 01/02/2024 CLINICAL DATA:  Pneumothorax.  Post chest tube placement. EXAM: CT CHEST WITH CONTRAST TECHNIQUE: Multidetector CT imaging of the chest was performed during intravenous contrast administration. RADIATION DOSE REDUCTION: This exam was performed according to the departmental dose-optimization program which includes automated exposure control, adjustment of the mA and/or kV according to patient size and/or use of iterative reconstruction technique. CONTRAST:  OMNIPAQUE  IOHEXOL  300 MG/ML  SOLN COMPARISON:  Chest radiograph 01/02/2024.  CT chest 12/20/2020 FINDINGS: Cardiovascular: Normal heart size. No pericardial effusions. Normal caliber thoracic aorta. Mediastinum/Nodes: No enlarged  mediastinal, hilar, or axillary lymph nodes. Thyroid  gland, trachea, and esophagus demonstrate no significant findings. Lungs/Pleura: Small right pneumothorax with right chest tube in place. Subcutaneous emphysema in the right chest wall. Consolidation in the right lung may be due to atelectasis or pneumonia. Mosaic attenuation pattern to the lungs could be due to motion artifact or air trapping. Edema would also be a possibility. No pleural effusions. No specific parenchymal blebs are identified although shallow inspiration and motion artifact limits evaluation. Upper Abdomen: No acute abnormality. Musculoskeletal: Degenerative changes in the spine. No acute bony abnormalities. No displaced rib fractures identified. IMPRESSION: 1. Small right pneumothorax with right chest tube in place. 2. Collapse or consolidation in the right lung, possibly compressive atelectasis or pneumonia. 3. Diffuse mosaic pattern to the lungs may represent motion artifact, air trapping, or edema. Electronically Signed   By: Boyce Byes M.D.   On: 01/02/2024 19:28   DG Chest Portable 1 View Result Date: 01/02/2024 CLINICAL DATA:  confirm chest tube placement EXAM: PORTABLE CHEST - 1 VIEW COMPARISON:  January 02, 2024 3:40 p.m. FINDINGS: Small bore, pigtail thoracostomy tube along the right upper lung zone. Improved aeration of the right lung with patchy airspace opacities in the right mid and lower lung fields. No pleural effusion or pneumothorax. Mild cardiomegaly. No acute fracture or destructive lesion. IMPRESSION: 1. Interval placement of a small bore, pigtail thoracostomy tube along the right upper lung zone. Improved aeration of the right lung without visualized pneumothorax. 2. Patchy airspace opacities in the right mid and lower lung fields, likely atelectasis. Electronically Signed   By: Rance Burrows M.D.   On: 01/02/2024 18:58   DG Chest 2 View Addendum Date: 01/02/2024 ADDENDUM REPORT: 01/02/2024 16:11 ADDENDUM: Critical  Value/emergent results were called by telephone at the time of interpretation on 01/02/2024 at 3:51 pm to provider [facility] , who verbally acknowledged these results. Electronically Signed   By: Beula Brunswick M.D.   On: 01/02/2024 16:11   Result Date: 01/02/2024 CLINICAL DATA:  shortness of breath. EXAM: CHEST - 2 VIEW COMPARISON:  12/20/2020. FINDINGS: There is moderate to large right pneumothorax with associated collapse of the right lung. No mediastinal shift.  Left lung and left lateral costophrenic angle are clear. Stable cardio-mediastinal silhouette. No acute osseous abnormalities. The soft tissues are within normal limits. IMPRESSION: Moderate to large right pneumothorax with associated collapse of the right lung. No mediastinal shift. Electronically Signed: By: Beula Brunswick M.D. On: 01/02/2024 15:47    Procedures .Critical Care  Performed by: Darletta Ehrich, PA-C Authorized by: Darletta Ehrich, PA-C   Critical care provider statement:    Critical care time (minutes):  30   Critical care time was exclusive of:  Separately billable procedures and treating other patients   Critical care was necessary to treat or prevent imminent or life-threatening deterioration of the following conditions:  Respiratory failure   Critical care was time spent personally by me on the following activities:  Blood draw for specimens, development of treatment plan with patient or surrogate, discussions with consultants, ordering and performing treatments and interventions, ordering and review of laboratory studies, ordering and review of radiographic studies, pulse oximetry and re-evaluation of patient's condition    Medications Ordered in ED Medications  ipratropium-albuterol  (DUONEB) 0.5-2.5 (3) MG/3ML nebulizer solution (  Not Given 01/02/24 1540)  sodium chloride  flush (NS) 0.9 % injection 10 mL (has no administration in time range)  azithromycin  (ZITHROMAX ) 500 mg in sodium chloride  0.9 % 250 mL  IVPB (has no administration in time range)  ipratropium-albuterol  (DUONEB) 0.5-2.5 (3) MG/3ML nebulizer solution 3 mL (3 mLs Nebulization Given 01/02/24 1510)  fentaNYL  (SUBLIMAZE ) injection 100 mcg (100 mcg Intravenous Given 01/02/24 1808)  lidocaine -EPINEPHrine  (XYLOCAINE  W/EPI) 2 %-1:200000 (PF) injection 20 mL (20 mLs Other Given 01/02/24 1830)  fentaNYL  (SUBLIMAZE ) injection 50 mcg (50 mcg Intravenous Given 01/02/24 1829)  iohexol  (OMNIPAQUE ) 300 MG/ML solution 100 mL (100 mLs Intravenous Contrast Given 01/02/24 1912)  cefTRIAXone  (ROCEPHIN ) 1 g in sodium chloride  0.9 % 100 mL IVPB (1 g Intravenous New Bag/Given 01/02/24 2015)  fentaNYL  (SUBLIMAZE ) injection 50 mcg (50 mcg Intravenous Given 01/02/24 2038)    ED Course/ Medical Decision Making/ A&P Clinical Course as of 01/02/24 2054  Thu Jan 02, 2024  2051 We spoke with Dr. Michell Ahumada with [facility] hospitalist who agrees to admit the patient. [CF]  2051 CBC with Differential(!) Normal. [CF]  2051 Comprehensive metabolic panel(!) Hypochloremia. [CF]  2052 Lipase, blood Negative.  [CF]  2052 Troponin T, High Sensitivity Initial troponin is normal.  [CF]    Clinical Course User Index [CF] Darletta Ehrich, PA-C   {   Click here for ABCD2, HEART and other calculators  Medical Decision Making Molly Archer is a 43 y.o. female patient who presents to the emergency department today for further evaluation of shortness of breath.  Given that the patient is so close to her recent cholecystectomy I will likely get a D-dimer to evaluate for pulmonary embolism.  I do favor asthma exacerbation as the patient does have decreased breath sounds and some faint expiratory wheezing.  Will give her some breathing treatments to see if this improves her symptoms.  Will also get EKG, chest x-ray and troponins as well as she is having some chest tightness to make sure this is not a cardiac cause.  Pneumothorax was treated with a chest tube performed by Dr. Tamela Fake  at the bedside.  Please see procedure your note for further detail.  Patient will be admitted to hospitalist service and likely pulmonary will be managing the chest tube.  She is stable for admission at this time.  Amount and/or Complexity of Data Reviewed Labs:  ordered. Decision-making details documented in ED Course. Radiology: ordered.  Risk Prescription drug management. Decision regarding hospitalization.    Final Clinical Impression(s) / ED Diagnoses Final diagnoses:  Primary spontaneous pneumothorax    Rx / DC Orders ED Discharge Orders     None         Olympia Bianchi 01/02/24 2054    Scarlette Currier, MD 01/04/24 [phone]

## 2024-01-02 NOTE — ED Notes (Signed)
Patient transported to X-Ray 

## 2024-01-02 NOTE — ED Notes (Signed)
 RT Note: Patient is experiencing some SOB and tight chest feeling. She has an inhaler that she feels was not helping. She was ordered a Duoneb to help with WOB. No wheezing noted

## 2024-01-02 NOTE — ED Notes (Addendum)
 Patient arrives from [place] via [place] for admission. Patient was seen at [facility] for worsening shortness of breath following cholecystectomy yesterday. Imaging showed right side pneumothorax. 14 Fr chest tube placed on right side. 20 to suction. 100mcg fentanyl  given prior to transport. 18G LAC.

## 2024-01-03 DIAGNOSIS — J95811 Postprocedural pneumothorax: Secondary | ICD-10-CM

## 2024-01-03 DIAGNOSIS — Z79891 Long term (current) use of opiate analgesic: Secondary | ICD-10-CM

## 2024-01-03 DIAGNOSIS — I10 Essential (primary) hypertension: Secondary | ICD-10-CM

## 2024-01-03 DIAGNOSIS — Z6841 Body Mass Index (BMI) 40.0 and over, adult: Secondary | ICD-10-CM

## 2024-01-03 DIAGNOSIS — E1165 Type 2 diabetes mellitus with hyperglycemia: Secondary | ICD-10-CM

## 2024-01-03 LAB — BASIC METABOLIC PANEL WITH GFR
Anion gap: 12 (ref 5–15)
BUN: 11 mg/dL (ref 6–20)
CO2: 24 mmol/L (ref 22–32)
Calcium: 9 mg/dL (ref 8.9–10.3)
Chloride: 99 mmol/L (ref 98–111)
Creatinine, Ser: 0.63 mg/dL (ref 0.44–1.00)
GFR, Estimated: >60 mL/min (ref >60)
Glucose, Bld: 142 mg/dL — ABNORMAL HIGH (ref 70–99)
Potassium: 4.2 mmol/L (ref 3.5–5.1)
Sodium: 135 mmol/L (ref 135–145)

## 2024-01-03 LAB — PROCALCITONIN: Procalcitonin: <0.1 ng/mL

## 2024-01-03 LAB — CBG MONITORING, ED
Glucose-Capillary: 150 mg/dL — ABNORMAL HIGH (ref 70–99)
Glucose-Capillary: 179 mg/dL — ABNORMAL HIGH (ref 70–99)
Glucose-Capillary: 192 mg/dL — ABNORMAL HIGH (ref 70–99)

## 2024-01-03 LAB — PHOSPHORUS: Phosphorus: 4.4 mg/dL (ref 2.5–4.6)

## 2024-01-03 LAB — CBC
HCT: 42.4 % (ref 36.0–46.0)
Hemoglobin: 13.1 g/dL (ref 12.0–15.0)
MCH: 27.9 pg (ref 26.0–34.0)
MCHC: 30.9 g/dL (ref 30.0–36.0)
MCV: 90.4 fL (ref 80.0–100.0)
Platelets: 399 10*3/uL (ref 150–400)
RBC: 4.69 MIL/uL (ref 3.87–5.11)
RDW: 15 % (ref 11.5–15.5)
WBC: 7.8 10*3/uL (ref 4.0–10.5)
nRBC: 0 % (ref 0.0–0.2)

## 2024-01-03 LAB — GLUCOSE, CAPILLARY
Glucose-Capillary: 165 mg/dL — ABNORMAL HIGH (ref 70–99)
Glucose-Capillary: 239 mg/dL — ABNORMAL HIGH (ref 70–99)
Glucose-Capillary: 206 mg/dL — ABNORMAL HIGH (ref 70–99)

## 2024-01-03 LAB — MAGNESIUM: Magnesium: 1.9 mg/dL (ref 1.7–2.4)

## 2024-01-03 MED ORDER — ACETAMINOPHEN 500 MG PO TABS: 1000.0000 mg | ORAL_TABLET | Freq: Four times a day (QID) | ORAL | Status: DC | PRN

## 2024-01-03 MED ORDER — HYDROMORPHONE HCL 1 MG/ML IJ SOLN
1.0000 mg | INTRAMUSCULAR | Status: DC | PRN
  Administered 2024-01-03 (×2): 1 mg via INTRAVENOUS
  Filled 2024-01-03 (×2): qty 1

## 2024-01-03 MED ORDER — NORETHINDRONE ACETATE 5 MG PO TABS
15.0000 mg | ORAL_TABLET | Freq: Every day | ORAL | Status: DC
  Administered 2024-01-03 – 2024-01-05 (×3): 15 mg via ORAL
  Filled 2024-01-03 (×3): qty 3

## 2024-01-03 MED ORDER — ONDANSETRON HCL 4 MG/2ML IJ SOLN: 4.0000 mg | Freq: Four times a day (QID) | INTRAMUSCULAR | Status: DC | PRN

## 2024-01-03 MED ORDER — SODIUM CHLORIDE 0.9 % IV SOLN: INTRAVENOUS | Status: DC

## 2024-01-03 MED ORDER — OXYCODONE HCL 5 MG PO TABS
10.0000 mg | ORAL_TABLET | Freq: Four times a day (QID) | ORAL | Status: DC | PRN
  Administered 2024-01-03 – 2024-01-05 (×4): 10 mg via ORAL
  Filled 2024-01-03 (×5): qty 2

## 2024-01-03 MED ORDER — HEPARIN SODIUM (PORCINE) 5000 UNIT/ML IJ SOLN
5000.0000 [IU] | Freq: Three times a day (TID) | INTRAMUSCULAR | Status: DC
  Administered 2024-01-03 – 2024-01-05 (×6): 5000 [IU] via SUBCUTANEOUS
  Filled 2024-01-03 (×6): qty 1

## 2024-01-03 MED ORDER — HYDROMORPHONE HCL 1 MG/ML IJ SOLN
1.5000 mg | INTRAMUSCULAR | Status: DC | PRN
  Administered 2024-01-03 (×2): 1.5 mg via INTRAVENOUS
  Filled 2024-01-03: qty 2
  Filled 2024-01-03: qty 1.5

## 2024-01-03 NOTE — H&P (Addendum)
 History and Physical    Molly Archer HKV:[id number] DOB: 08-03-1980 DOA: 01/02/2024  PCP: de [place], Raymond J, MD   Patient coming from: ED -> ED transfer from [facility] ED    Chief Complaint:  Chief Complaint  Patient presents with   Shortness of Breath   Post-op Problem    HPI:  Molly Archer is a 43 y.o. female with hx of asthma, HTN, morbid obesity, recent hx chronic cholecystitis / bilary dyskinesia s/p lap cholecystectomy by Dr. Elvan Hamel on 6/4 who is transferred from [facility] ED for pneumothorax. Reports that after her surgery she had slight amount of SOB and R sided chest discomfort. Gradual onset. This AM was much worse and had significant R chest pain and SOB with any exertion, called surgical center who referred to ED. Found to have a moderate-large R PTX without mediastinal shift and chest tube was placed by outside EDP.    Review of Systems:  ROS complete and negative except as marked above   Allergies  Allergen Reactions   Codeine Hives   Doxycycline  Nausea Only    Pt states causes nausea and vomiting.   Losartan Other (See Comments)    headache   Penicillins Hives   Tramadol  Hives    Prior to Admission medications   Medication Sig Start Date End Date Taking? Authorizing Provider  acetaminophen  (TYLENOL ) 500 MG tablet Take 2 tablets (1,000 mg total) by mouth every 8 (eight) hours for 5 days. 01/01/24 01/06/24  Aldean Hummingbird, MD  albuterol  (VENTOLIN  HFA) [phone] Base) MCG/ACT inhaler Inhale 2 puffs into the lungs every 4 (four) hours as needed for wheezing or shortness of breath (cough, shortness of breath or wheezing.). Patient not taking: Reported on 12/26/2023 10/14/23   de [place], Alonza Jansky, MD  albuterol  (VENTOLIN  HFA) [phone] Base) MCG/ACT inhaler Inhale 2 puffs into the lungs every 4 (four) hours as needed for wheezing or shortness of breath (cough, shortness of breath or wheezing.). 10/14/23   de [place], Alonza Jansky, MD  Ciclopirox  1 % shampoo Apply 1 each topically at  bedtime. 06/18/23   de [place], Raymond J, MD  clobetasol  ointment (TEMOVATE ) 0.05 % Apply topically 2 (two) times daily. Patient taking differently: Apply 1 Application topically 2 (two) times daily as needed (psoriasis). 12/24/23   de [place], Alonza Jansky, MD  diclofenac  (VOLTAREN ) 75 MG EC tablet Take 1 tablet (75 mg total) by mouth 2 (two) times daily with a meal. Patient taking differently: Take 75 mg by mouth 2 (two) times daily as needed for moderate pain (pain score 4-6). 12/26/23   Rawland Caddy, MD  doxazosin  (CARDURA ) 1 MG tablet Take 1 tablet (1 mg total) by mouth daily. 12/23/23   Caudle, Arcola Kocher, FNP  Fluocinolone  Acetonide Scalp 0.01 % OIL Apply 1 Application topically 2 (two) times a week. 08/22/23   Caudle, Arcola Kocher, FNP  glipiZIDE  (GLUCOTROL  XL) 10 MG 24 hr tablet Take 1 tablet (10 mg total) by mouth daily. 11/19/23   de [place], Alonza Jansky, MD  glucose blood (PRECISION QID TEST) test strip Check glucose twice daily as needed. 09/19/22   [provider]  hydrochlorothiazide  (MICROZIDE ) 12.5 MG capsule Take 1 capsule (12.5 mg total) by mouth daily. 11/19/23   de [place], Alonza Jansky, MD  ipratropium-albuterol  (DUONEB) 0.5-2.5 (3) MG/3ML SOLN Inhale 3 mLs into the lungs every 6 (six) hours as needed. Patient not taking: Reported on 12/26/2023 07/09/23   Wilhelmena Hanson, FNP  irbesartan -hydrochlorothiazide  (AVALIDE) 300-12.5 MG tablet  Take 1 tablet by mouth daily. 11/19/23   de [place], Alonza Jansky, MD  lidocaine  (XYLOCAINE ) 2 % solution Use as directed 15 mLs in the mouth or throat every 4 (four) hours as needed. 12/09/23     Naftifine  HCl 2 % CREA Apply liberally to affected area daily Patient taking differently: Apply 1 Application topically daily as needed (dry feet). 12/24/23   de [place], Alonza Jansky, MD  Naphazoline-Pheniramine (ALLERGY EYE OP) Place 1 drop into both eyes daily as needed (allergies).    [provider]  NIFEdipine  (PROCARDIA  XL/NIFEDICAL XL) 60 MG 24 hr tablet Take  1 tablet (60 mg total) by mouth daily. 11/19/23   de [place], Raymond J, MD  norethindrone  (AYGESTIN ) 5 MG tablet Take 3 tablets (15 mg total) by mouth daily. 10/22/18   Anyanwu, Ugonna A, MD  Oxycodone  HCl 10 MG TABS Take 1 tablet (10 mg total) by mouth every 8 (eight) hours as needed. 12/30/23   Rawland Caddy, MD  tiZANidine  (ZANAFLEX ) 4 MG tablet Take 1 tablet (4 mg total) by mouth every 8 (eight) hours as needed for muscle spasms. 12/03/23   de [place], Raymond J, MD  topiramate  (TOPAMAX ) 50 MG tablet Take 1/2 tablet at bedtime for one week, then increase to 1 tablet at bedtime Patient not taking: Reported on 12/26/2023 07/30/23   Merriam Abbey, DO  triamcinolone  cream (KENALOG ) 0.1 % Apply 1 Application topically 2 (two) times daily. Patient taking differently: Apply 1 Application topically 2 (two) times daily as needed (dry skin). 12/24/23   de [place], Raymond J, MD  zolpidem  (AMBIEN ) 5 MG tablet Take 1 tablet (5 mg total) by mouth at bedtime as needed for sleep. 12/03/23   de [place], Raymond J, MD    Past Medical History:  Diagnosis Date   Anemia    pt states she took iron for this around 2019, and has had no issues since   Arthritis    knees   Asthma    only uses inhaler in fall and spring   Constipation    Diabetes mellitus without complication (HCC)    GERD (gastroesophageal reflux disease)    diet controlled, occ. uses omeprazole    Headache    otc med prn   Heart murmur    History of kidney stones    Hx of Bell's palsy 12/2011   Hypertension    Menometrorrhagia    Psoriasis    Shortness of breath dyspnea    with exercise/exertion   Vitamin D  deficiency     Past Surgical History:  Procedure Laterality Date   CHOLECYSTECTOMY N/A 01/01/2024   Procedure: LAPAROSCOPIC CHOLECYSTECTOMY;  Surgeon: Aldean Hummingbird, MD;  Location[facility] OR;  Service: General;  Laterality: N/A;   COMBINED HYSTEROSCOPY DIAGNOSTIC / D&C N/A 07/30/2009   Benign secretory endometrium   CYSTOSCOPY WITH STENT PLACEMENT  Left 10/19/2014   Procedure: CYSTOSCOPY WITH STENT PLACEMENT;  Surgeon: Andrez Banker, MD;  Location: [facility] ORS;  Service: Urology;  Laterality: Left;   DILATION AND CURETTAGE OF UTERUS  10/29/2003   Polyp, mild atypia, simple and complex hyperplasia,   DILATION AND CURETTAGE OF UTERUS  11/28/2003   Simple and complex hyperplasia   DILATION AND CURETTAGE OF UTERUS  07/30/2009   DILATION AND CURETTAGE OF UTERUS N/A 12/14/2014   Procedure: DILATATION AND CURETTAGE;  Surgeon: Verlyn Goad, MD;  Location: [facility] ORS;  Service: Gynecology;  Laterality: N/A;   HOLMIUM LASER APPLICATION Left 10/19/2014   Procedure: HOLMIUM LASER APPLICATION;  Surgeon: Andrez Banker, MD;  Location: [facility] ORS;  Service: Urology;  Laterality: Left;   HYSTEROSCOPY WITH D & C N/A 07/21/2018   Procedure: DILATATION AND CURETTAGE /HYSTEROSCOPY;  Surgeon: Tresia Fruit, MD;  Location: [facility] ORS;  Service: Gynecology;  Laterality: N/A;   INTRAUTERINE DEVICE (IUD) INSERTION N/A 12/14/2014   Procedure: INTRAUTERINE DEVICE (IUD) INSERTION;  Surgeon: Verlyn Goad, MD;  Location: [facility] ORS;  Service: Gynecology;  Laterality: N/A;   KNEE CARTILAGE SURGERY Left    STONE EXTRACTION WITH BASKET Left 10/19/2014   Procedure: STONE EXTRACTION WITH BASKET;  Surgeon: Andrez Banker, MD;  Location: [facility] ORS;  Service: Urology;  Laterality: Left;   WISDOM TOOTH EXTRACTION       reports that she has never smoked. She has never been exposed to tobacco smoke. She has never used smokeless tobacco. She reports that she does not drink alcohol and does not use drugs.  Family History  Problem Relation Age of Onset   Hypertension Mother    Hyperlipidemia Mother    Hypertension Father    Stroke Maternal Grandmother    Hypertension Other    Diabetes Other    Cancer Other    Liver disease Neg Hx    Colon cancer Neg Hx    Esophageal cancer Neg Hx      Physical Exam: Vitals:   01/02/24 2145 01/02/24 2200 01/02/24 2254 01/02/24 2255   BP: (!) 161/90 (!) 179/99 (!) 165/89   Pulse: 80 75 73 73  Resp: 18 (!) 21 17 17   Temp:      TempSrc:      SpO2: 96% 95% 96% 96%    Gen: Awake, alert, NAD   CV: Regular, normal S1, S2, no murmurs  Resp: Tachypneic, R chest tube to wall suction, slight diminished breath sounds on R.  Abd: obese, port sites are clean and dry with dermabond in place, normoactive, nontender MSK: Symmetric, no edema  Skin: No rashes or lesions to exposed skin  Neuro: Alert and interactive  Psych: euthymic, appropriate    Data review:   Labs reviewed, notable for:   Unremarkable   Micro:  Results for orders placed or performed during the hospital encounter of 10/01/23  Urine Culture     Status: None   Collection Time: 10/01/23  7:57 AM   Specimen: Urine, Clean Catch  Result Value Ref Range Status   Specimen Description   Final    URINE, CLEAN CATCH Performed at [facility], [address], [place], [place] [address]    Special Requests   Final    NONE Performed at [facility], [address], [place], [place] [address]    Culture   Final    NO GROWTH Performed at [facility], [address] [place], [place] [address]    Report Status 10/02/2023 FINAL  Final    Imaging reviewed:  CT Chest W Contrast Result Date: 01/02/2024 CLINICAL DATA:  Pneumothorax.  Post chest tube placement. EXAM: CT CHEST WITH CONTRAST TECHNIQUE: Multidetector CT imaging of the chest was performed during intravenous contrast administration. RADIATION DOSE REDUCTION: This exam was performed according to the departmental dose-optimization program which includes automated exposure control, adjustment of the mA and/or kV according to patient size and/or use of iterative reconstruction technique. CONTRAST:  OMNIPAQUE  IOHEXOL  300 MG/ML  SOLN COMPARISON:  Chest radiograph 01/02/2024.  CT chest 12/20/2020 FINDINGS: Cardiovascular: Normal heart size. No pericardial  effusions. Normal caliber thoracic aorta. Mediastinum/Nodes: No  enlarged mediastinal, hilar, or axillary lymph nodes. Thyroid  gland, trachea, and esophagus demonstrate no significant findings. Lungs/Pleura: Small right pneumothorax with right chest tube in place. Subcutaneous emphysema in the right chest wall. Consolidation in the right lung may be due to atelectasis or pneumonia. Mosaic attenuation pattern to the lungs could be due to motion artifact or air trapping. Edema would also be a possibility. No pleural effusions. No specific parenchymal blebs are identified although shallow inspiration and motion artifact limits evaluation. Upper Abdomen: No acute abnormality. Musculoskeletal: Degenerative changes in the spine. No acute bony abnormalities. No displaced rib fractures identified. IMPRESSION: 1. Small right pneumothorax with right chest tube in place. 2. Collapse or consolidation in the right lung, possibly compressive atelectasis or pneumonia. 3. Diffuse mosaic pattern to the lungs may represent motion artifact, air trapping, or edema. Electronically Signed   By: Boyce Byes M.D.   On: 01/02/2024 19:28   DG Chest Portable 1 View Result Date: 01/02/2024 CLINICAL DATA:  confirm chest tube placement EXAM: PORTABLE CHEST - 1 VIEW COMPARISON:  January 02, 2024 3:40 p.m. FINDINGS: Small bore, pigtail thoracostomy tube along the right upper lung zone. Improved aeration of the right lung with patchy airspace opacities in the right mid and lower lung fields. No pleural effusion or pneumothorax. Mild cardiomegaly. No acute fracture or destructive lesion. IMPRESSION: 1. Interval placement of a small bore, pigtail thoracostomy tube along the right upper lung zone. Improved aeration of the right lung without visualized pneumothorax. 2. Patchy airspace opacities in the right mid and lower lung fields, likely atelectasis. Electronically Signed   By: Rance Burrows M.D.   On: 01/02/2024 18:58   DG Chest 2  View Addendum Date: 01/02/2024 ADDENDUM REPORT: 01/02/2024 16:11 ADDENDUM: Critical Value/emergent results were called by telephone at the time of interpretation on 01/02/2024 at 3:51 pm to provider [facility] , who verbally acknowledged these results. Electronically Signed   By: Beula Brunswick M.D.   On: 01/02/2024 16:11   Result Date: 01/02/2024 CLINICAL DATA:  shortness of breath. EXAM: CHEST - 2 VIEW COMPARISON:  12/20/2020. FINDINGS: There is moderate to large right pneumothorax with associated collapse of the right lung. No mediastinal shift. Left lung and left lateral costophrenic angle are clear. Stable cardio-mediastinal silhouette. No acute osseous abnormalities. The soft tissues are within normal limits. IMPRESSION: Moderate to large right pneumothorax with associated collapse of the right lung. No mediastinal shift. Electronically Signed: By: Beula Brunswick M.D. On: 01/02/2024 15:47   Reviewed CXR (after transfer)     ED Course:  Found to have mod-large PTX on R and Chest tube placed by outside EDP. Treated with CTX, azithromycin  with question of pneumonia, nebs. Fentanyl  for pain,     Assessment/Plan:  43 y.o. female with hx asthma, HTN, morbid obesity, recent hx chronic cholecystitis / bilary dyskinesia s/p lap cholecystectomy by Dr. Elvan Hamel on 6/4 who is transferred from [facility] ED for pneumothorax.  Pneumothorax, ? Iatrogenic  Hx lap chole on 6/4 with gradual development of R chest pain and SOB which severely worsened 6/5 AM. CXR with mod-large PTX and chest tube placed by outside EDP. Etiology suspected iatrogenic I/s/o recent abd surgery.  -PCCM consulted, contacted after arrival here. They have reviewed x-ray after ED to ED transfer and will place orders for chest tube management as overnight + CXR for AM.  Plan for consult in the morning and have added to list.  -Continue O2 via nasal cannula -Pain control Tylenol  as needed mild, oxycodone  10  mg every 6 hours as needed for  moderate, Dilaudid  1 IV Q for as needed for severe. Home Tizanidine  as adjunct  -maintenance IV fluid 75 cc an hour until taking adequate p.o.'s -Doubt underlying pneumonia, stop Abx, s/p CTX, azithromycin  in ED. Likely atelectasis or reexpansion pulmonary edema.   S/p laparoscopic cholecystectomy 6/4  -Routine general surgery consult in the morning, please contact in AM   Chronic medical problems: History asthma: Albuterol  prn Hypertension: Hold home HCTZ with suspect volume depletion.  Continue irbesartan , doxazosin , nifedipine  DM type 2: Hold home glipizide .  SSI for very sensitive with at bedtime correction to start titrate as needed. Morbid obesity: Would benefit from weight loss outpatient  There is no height or weight on file to calculate BMI.    DVT prophylaxis:  SCDs Code Status:  Full Code Diet:  Diet Orders (From admission, onward)    None      Family Communication:  Yes discussed with mother at bedside   Consults:  PCCM   Admission status:   Inpatient, Step Down Unit  Severity of Illness: The appropriate patient status for this patient is INPATIENT. Inpatient status is judged to be reasonable and necessary in order to provide the required intensity of service to ensure the patient's safety. The patient's presenting symptoms, physical exam findings, and initial radiographic and laboratory data in the context of their chronic comorbidities is felt to place them at high risk for further clinical deterioration. Furthermore, it is not anticipated that the patient will be medically stable for discharge from the hospital within 2 midnights of admission.   * I certify that at the point of admission it is my clinical judgment that the patient will require inpatient hospital care spanning beyond 2 midnights from the point of admission due to high intensity of service, high risk for further deterioration and high frequency of surveillance required.*   Arnulfo Larch, MD [facility]  How to contact the [facility] Attending or Consulting provider 7A - 7P or covering provider during after hours 7P -7A, for this patient.  Check the care team in [facility] and look for a) attending/consulting [facility] provider listed and b) the [facility] team listed Log into [email or web address] and use [place]'s universal password to access. If you do not have the password, please contact the hospital operator. Locate the [facility] provider you are looking for under [facility] and page to a number that you can be directly reached. If you still have difficulty reaching the provider, please page the [facility] (Director on Call) for the Hospitalists listed on amion for assistance.  01/03/2024, 12:14 AM

## 2024-01-03 NOTE — Plan of Care (Signed)

## 2024-01-03 NOTE — Assessment & Plan Note (Signed)
 Estimated body mass index is 55.8 kg/m as calculated from the following:   Height as of 01/01/24: 5\' 5"  (1.651 m).   Weight as of 01/01/24: 152.1 kg.

## 2024-01-03 NOTE — Progress Notes (Signed)
 Trauma Event Note  Called by ED CRN about order to flush CT every 8 hours requiring competency. I verified order and went in to assess the patient and identified no stopcock on the chest tube. I spoke with Dr Villa Greaser with CCM to determine need for flushing, as this would not be an easy procedure to complete every 8 hours without stopcock being present. Dr Villa Greaser confirmed that order to flush CT could be discontinued until tomorrow and the PCCM rounding team would reassess then and place stopcock if flushing is necessary. Communicated with ED RN and inpatient RN on 5W.  Molly Archer  Trauma Response RN  Please call [facility] at [phone] for further assistance.

## 2024-01-03 NOTE — ED Notes (Signed)
Report given to 5W - 

## 2024-01-03 NOTE — Consult Note (Signed)
 NAME:  Molly Archer, MRN:  [id number], DOB:  August 02, 1980, LOS: 1 ADMISSION DATE:  01/02/2024, CONSULTATION DATE:  01/03/2024  REFERRING MD:  Farrel Hones, TRH, CHIEF COMPLAINT:  pneumothorax   History of Present Illness:  43 year old never smoker with history of chronic cystitis underwent lap cholecystectomy on 6/4.  She developed shortness of breath and right-sided chest discomfort.  And call the surgical center who referred her to the ED. She was found to have a large right pneumothorax with collapsed lung, pigtail was placed in the ED She was transferred to [facility] for admission. PCCM consulted.  CT chest showed small right pneumothorax with expanded lung and mild subcutaneous emphysema, there was consolidation in the right lung   Pertinent  Medical History  asthma,  HTN,  morbid obesity   Significant Hospital Events: Including procedures, antibiotic start and stop dates in addition to other pertinent events     Interim History / Subjective:  Complains of pain at tube site Breathing better  Objective    Blood pressure (!) 149/90, pulse 67, temperature 98.5 F (36.9 C), temperature source Oral, resp. rate 19, last menstrual period 06/30/2018, SpO2 97%.        Intake/Output Summary (Last 24 hours) at 01/03/2024 1447 Last data filed at 01/03/2024 0413 Gross per 24 hour  Intake 364.78 ml  Output --  Net 364.78 ml   There were no vitals filed for this visit.  Examination: Gen. Pleasant, obese, in no distress, normal affect ENT - no pallor,icterus, no post nasal drip, class 2 airway Neck: No JVD, no thyromegaly, no carotid bruits Lungs: no use of accessory muscles, no dullness to percussion, decreased without rales or rhonchi  Cardiovascular: Rhythm regular, heart sounds  normal, no murmurs or gallops, no peripheral edema Abdomen: soft and non-tender, no hepatosplenomegaly, BS normal. Musculoskeletal: No deformities, no cyanosis or clubbing Neuro:  alert, non focal, no  tremors    Chest x-ray 6/6 shows tiny residual right apical pneumothorax, pigtail in place right perihilar infiltrate Resolved problem list   Assessment and Plan   Right spontaneous pneumothorax after lap cholecystectomy on 6/4 - Reason unclear, no intraoperative events noted, appreciate surgical input. -CT chest does not demonstrate clear blebs or apical emphysema.  - Continue chest tube to wall suction for 24 hours, once airleak resolves can transition to waterseal and clamp before discontinuing - Chest x-ray in a.m.   Likely OSA Morbid obesity  - Hold off CPAP for now    Best Practice (right click and "Reselect all SmartList Selections" daily)    Code Status:  full code Last date of multidisciplinary goals of care discussion [NA]  Labs   CBC: Recent Labs  Lab 12/30/23 1404 01/02/24 1609 01/02/24 2341  WBC 8.1 9.0 7.8  NEUTROABS  --  7.1  --   HGB 14.4 12.9 13.1  HCT 45.3 39.9 42.4  MCV 89.0 87.7 90.4  PLT 403* 401* 399    Basic Metabolic Panel: Recent Labs  Lab 12/30/23 1404 01/02/24 1609 01/02/24 2341  NA 138 136 135  K 3.7 3.9 4.2  CL 100 97* 99  CO2 26 24 24   GLUCOSE 78 272* 142*  BUN 9 13 11   CREATININE 0.63 0.70 0.63  CALCIUM 9.5 9.8 9.0  MG  --   --  1.9  PHOS  --   --  4.4   GFR: Estimated Creatinine Clearance: 138.8 mL/min (by C-G formula based on SCr of 0.63 mg/dL). Recent Labs  Lab 12/30/23 1404 01/02/24 1609  01/02/24 2341  PROCALCITON  --   --  <0.10  WBC 8.1 9.0 7.8    Liver Function Tests: Recent Labs  Lab 01/02/24 1609  AST 24  ALT 48*  ALKPHOS 40  BILITOT 0.4  PROT 7.3  ALBUMIN 4.1   Recent Labs  Lab 01/02/24 1609  LIPASE 15   No results for input(s): "AMMONIA" in the last 168 hours.  ABG    Component Value Date/Time   TCO2 25 06/12/2016 0759     Coagulation Profile: No results for input(s): "INR", "PROTIME" in the last 168 hours.  Cardiac Enzymes: No results for input(s): "CKTOTAL", "CKMB",  "CKMBINDEX", "TROPONINI" in the last 168 hours.  HbA1C: HbA1c POC (<> result, manual entry)  Date/Time Value Ref Range Status  07/16/2023 03:29 PM 7.9 4.0 - 5.6 % Final   Hgb A1c MFr Bld  Date/Time Value Ref Range Status  11/12/2023 04:29 PM 8.0 (H) 4.8 - 5.6 % Final    Comment:             Prediabetes: 5.7 - 6.4          Diabetes: >6.4          Glycemic control for adults with diabetes: <7.0   03/04/2023 04:19 PM 7.7 (H) 4.8 - 5.6 % Final    Comment:             Prediabetes: 5.7 - 6.4          Diabetes: >6.4          Glycemic control for adults with diabetes: <7.0     CBG: Recent Labs  Lab 01/01/24 1218 01/03/24 0029 01/03/24 0843 01/03/24 0942 01/03/24 1428  GLUCAP 232* 150* 179* 192* 165*    Review of Systems:   Chest pain Shortness of breath improved   Constitutional: negative for anorexia, fevers and sweats  Eyes: negative for irritation, redness and visual disturbance  Ears, nose, mouth, throat, and face: negative for earaches, epistaxis, nasal congestion and sore throat  Cardiovascular: negative for chest pain, dyspnea, lower extremity edema, orthopnea, palpitations and syncope  Gastrointestinal: negative for abdominal pain, constipation, diarrhea, melena, nausea and vomiting  Genitourinary:negative for dysuria, frequency and hematuria  Hematologic/lymphatic: negative for bleeding, easy bruising and lymphadenopathy  Musculoskeletal:negative for arthralgias, muscle weakness and stiff joints  Neurological: negative for coordination problems, gait problems, headaches and weakness  Endocrine: negative for diabetic symptoms including polydipsia, polyuria and weight loss   Past Medical History:  She,  has a past medical history of Anemia, Arthritis, Asthma, Constipation, Diabetes mellitus without complication (HCC), GERD (gastroesophageal reflux disease), Headache, Heart murmur, History of kidney stones, Bell's palsy (12/2011), Hypertension, Menometrorrhagia,  Psoriasis, Shortness of breath dyspnea, and Vitamin D  deficiency.   Surgical History:   Past Surgical History:  Procedure Laterality Date   CHOLECYSTECTOMY N/A 01/01/2024   Procedure: LAPAROSCOPIC CHOLECYSTECTOMY;  Surgeon: Aldean Hummingbird, MD;  Location: [facility] OR;  Service: General;  Laterality: N/A;   COMBINED HYSTEROSCOPY DIAGNOSTIC / D&C N/A 07/30/2009   Benign secretory endometrium   CYSTOSCOPY WITH STENT PLACEMENT Left 10/19/2014   Procedure: CYSTOSCOPY WITH STENT PLACEMENT;  Surgeon: Andrez Banker, MD;  Location: [facility] ORS;  Service: Urology;  Laterality: Left;   DILATION AND CURETTAGE OF UTERUS  10/29/2003   Polyp, mild atypia, simple and complex hyperplasia,   DILATION AND CURETTAGE OF UTERUS  11/28/2003   Simple and complex hyperplasia   DILATION AND CURETTAGE OF UTERUS  07/30/2009   DILATION AND CURETTAGE OF UTERUS N/A  12/14/2014   Procedure: DILATATION AND CURETTAGE;  Surgeon: Verlyn Goad, MD;  Location: [facility] ORS;  Service: Gynecology;  Laterality: N/A;   HOLMIUM LASER APPLICATION Left 10/19/2014   Procedure: HOLMIUM LASER APPLICATION;  Surgeon: Andrez Banker, MD;  Location: [facility];  Service: Urology;  Laterality: Left;   HYSTEROSCOPY WITH D & C N/A 07/21/2018   Procedure: DILATATION AND CURETTAGE /HYSTEROSCOPY;  Surgeon: Tresia Fruit, MD;  Location: [facility] OR[facility];  Service: Gynecology;  Laterality: N/A;   INTRAUTERINE DEVICE (IUD) INSERTION N/A 12/14/2014   Procedure: INTRAUTERINE DEVICE (IUD) INSERTION;  Surgeon: Verlyn Goad, MD;  Location: [facility];  Service: Gynecology;  Laterality: N/A;   KNEE CARTILAGE SURGERY Left    STONE EXTRACTION WITH BASKET Left 10/19/2014   Procedure: STONE EXTRACTION WITH BASKET;  Surgeon: Andrez Banker, MD;  Location: [facility];  Service: Urology;  Laterality: Left;   WISDOM TOOTH EXTRACTION       Social History:   reports that she has never smoked. She has never been exposed to tobacco smoke. She has never used smokeless tobacco. She reports  that she does not drink alcohol and does not use drugs.   Family History:  Her family history includes Cancer in an other family member; Diabetes in an other family member; Hyperlipidemia in her mother; Hypertension in her father, mother, and another family member; Stroke in her maternal grandmother. There is no history of Liver disease, Colon cancer, or Esophageal cancer.   Allergies Allergies  Allergen Reactions   Codeine Hives   Doxycycline  Nausea Only    Pt states causes nausea and vomiting.   Losartan Other (See Comments)    headache   Penicillins Hives   Tramadol  Hives     Home Medications  Prior to Admission medications   Medication Sig Start Date End Date Taking? Authorizing Provider  acetaminophen  (TYLENOL ) 500 MG tablet Take 2 tablets (1,000 mg total) by mouth every 8 (eight) hours for 5 days. 01/01/24 01/06/24 Yes Aldean Hummingbird, MD  albuterol  (VENTOLIN  HFA) [phone] Base) MCG/ACT inhaler Inhale 2 puffs into the lungs every 4 (four) hours as needed for wheezing or shortness of breath (cough, shortness of breath or wheezing.). 10/14/23  Yes de Peru, Raymond J, MD  Ciclopirox  1 % shampoo Apply 1 each topically at bedtime. 06/18/23  Yes de [place], Raymond J, MD  clobetasol  ointment (TEMOVATE ) 0.05 % Apply topically 2 (two) times daily. Patient taking differently: Apply 1 Application topically 2 (two) times daily as needed (psoriasis). 12/24/23  Yes de [place], Raymond J, MD  doxazosin  (CARDURA ) 1 MG tablet Take 1 tablet (1 mg total) by mouth daily. 12/23/23  Yes Caudle, Arcola Kocher, FNP  glipiZIDE  (GLUCOTROL  XL) 10 MG 24 hr tablet Take 1 tablet (10 mg total) by mouth daily. 11/19/23  Yes de [place], Raymond J, MD  hydrochlorothiazide  (MICROZIDE ) 12.5 MG capsule Take 1 capsule (12.5 mg total) by mouth daily. 11/19/23  Yes de [place], Raymond J, MD  irbesartan -hydrochlorothiazide  (AVALIDE) 300-12.5 MG tablet Take 1 tablet by mouth daily. 11/19/23  Yes de [place], Raymond J, MD  lidocaine  (XYLOCAINE ) 2 %  solution Use as directed 15 mLs in the mouth or throat every 4 (four) hours as needed. 12/09/23  Yes   Naphazoline-Pheniramine (ALLERGY EYE OP) Place 1 drop into both eyes daily as needed (allergies).   Yes [provider]  NIFEdipine  (PROCARDIA  XL/NIFEDICAL XL) 60 MG 24 hr tablet Take 1 tablet (60 mg total) by mouth daily. 11/19/23  Yes de [place],  Alonza Jansky, MD  norethindrone  (AYGESTIN ) 5 MG tablet Take 3 tablets (15 mg total) by mouth daily. 10/22/18  Yes Anyanwu, Ugonna A, MD  Oxycodone  HCl 10 MG TABS Take 1 tablet (10 mg total) by mouth every 8 (eight) hours as needed. 12/30/23  Yes Rawland Caddy, MD  tiZANidine  (ZANAFLEX ) 4 MG tablet Take 1 tablet (4 mg total) by mouth every 8 (eight) hours as needed for muscle spasms. 12/03/23  Yes de [place], Raymond J, MD  triamcinolone  cream (KENALOG ) 0.1 % Apply 1 Application topically 2 (two) times daily. Patient taking differently: Apply 1 Application topically 2 (two) times daily as needed (dry skin). 12/24/23  Yes de [place], Raymond J, MD  zolpidem  (AMBIEN ) 5 MG tablet Take 1 tablet (5 mg total) by mouth at bedtime as needed for sleep. 12/03/23  Yes de [place], Raymond J, MD  albuterol  (VENTOLIN  HFA) 108 [phone] Base) MCG/ACT inhaler Inhale 2 puffs into the lungs every 4 (four) hours as needed for wheezing or shortness of breath (cough, shortness of breath or wheezing.). Patient not taking: Reported on 01/03/2024 10/14/23   de [place], Alonza Jansky, MD  diclofenac  (VOLTAREN ) 75 MG EC tablet Take 1 tablet (75 mg total) by mouth 2 (two) times daily with a meal. Patient not taking: Reported on 01/03/2024 12/26/23   Rawland Caddy, MD  Fluocinolone  Acetonide Scalp 0.01 % OIL Apply 1 Application topically 2 (two) times a week. Patient not taking: Reported on 01/03/2024 08/22/23   Caudle, Alexis Olivia, FNP  glucose blood (PRECISION QID TEST) test strip Check glucose twice daily as needed. 09/19/22   [provider]  ipratropium-albuterol  (DUONEB) 0.5-2.5 (3) MG/3ML SOLN  Inhale 3 mLs into the lungs every 6 (six) hours as needed. Patient not taking: Reported on 01/03/2024 07/09/23   Wilhelmena Hanson, FNP  Naftifine  HCl 2 % CREA Apply liberally to affected area daily Patient not taking: Reported on 01/03/2024 12/24/23   de [place], Alonza Jansky, MD     Celene Coins MD. [facility]. [place] [facility] Pager : [phone]  If no response to pager , please call [phone] until 7 pm After 7:00 pm call Elink  [phone]   01/03/2024

## 2024-01-03 NOTE — ED Provider Notes (Signed)
  Physical Exam  BP (!) 165/89   Pulse 73   Temp 98 F (36.7 C) (Oral)   Resp 17   LMP 06/30/2018 (Exact Date)   SpO2 96%   Physical Exam  Procedures  CHEST TUBE INSERTION  Date/Time: 01/03/2024 3:06 AM  Performed by: Scarlette Currier, MD Authorized by: Scarlette Currier, MD   Consent:    Consent obtained:  Verbal   Consent given by:  Patient   Risks, benefits, and alternatives were discussed: yes     Risks discussed:  Bleeding, nerve damage, damage to surrounding structures, infection, incomplete drainage and pain   Alternatives discussed:  No treatment Universal protocol:    Procedure explained and questions answered to patient or proxy's satisfaction: yes     Relevant documents present and verified: yes     Test results available: yes     Imaging studies available: yes     Required blood products, implants, devices, and special equipment available: yes     Site/side marked: yes     Immediately prior to procedure, a time out was called: yes     Patient identity confirmed:  Verbally with patient Pre-procedure details:    Skin preparation:  Chlorhexidine  and povidone-iodine   Preparation: Patient was prepped and draped in the usual sterile fashion   Sedation:    Sedation type:  None Anesthesia:    Anesthesia method:  Local infiltration   Local anesthetic:  Lidocaine  2% w/o epi Procedure details:    Placement location:  R lateral   Tube size (French): 14.   Tube connected to:  Suction   Drainage characteristics:  Air only   Dressing:  Petrolatum-impregnated gauze Post-procedure details:    Post-insertion x-ray findings: tube in good position     Procedure completion:  Tolerated well, no immediate complications Comments:     Vincente Green Catheter Kit   ED Course / MDM   Clinical Course as of 01/03/24 0305  Thu Jan 02, 2024  2051 We spoke with Dr. Michell Ahumada with [facility] hospitalist who agrees to admit the patient. [CF]  2051 CBC with Differential(!) Normal. [CF]  2051  Comprehensive metabolic panel(!) Hypochloremia. [CF]  2052 Lipase, blood Negative.  [CF]  2052 Troponin T, High Sensitivity Initial troponin is normal.  [CF]    Clinical Course User Index [CF] Darletta Ehrich, PA-C    Presents with pneumothorax on right side after having surgery yesterday.   Spoke with Dr Melton Squires on call for general surgery who paged Dr. Elvan Hamel who performed the surgery.  Spoke with Dr. Elvan Hamel who reports they will follow in the hospital. Unclear etiology of pneumothorax with lower incisions for the surgery and doubt diaphgrahm injury. Consider related to positive pressure ventilation?  After chest tube placed, CT completed to evaluate for underlying blebs and shows improvement of pneumothorax, findings that may be atelectasis, pneumonia, reexpansion pulmonary edema.  No sign of blebs. Given rocephin/azithromycin .   Will transfer to [facility] for further chest tube management/admission. Hospitalist admitting, PCCM consulted for chest tube management and surgery to follow.       Scarlette Currier, MD 01/03/24 [phone]

## 2024-01-03 NOTE — Hospital Course (Signed)
 HPI: Molly Archer is a 43 y.o. female with hx of asthma, HTN, morbid obesity, recent hx chronic cholecystitis / bilary dyskinesia s/p lap cholecystectomy by Dr. Elvan Hamel on 6/4 who is transferred from [facility] ED for pneumothorax. Reports that after her surgery she had slight amount of SOB and R sided chest discomfort. Gradual onset. This AM was much worse and had significant R chest pain and SOB with any exertion, called surgical center who referred to ED. Found to have a moderate-large R PTX without mediastinal shift and chest tube was placed by outside EDP.   Significant Events: Admitted 01/02/2024 for right pneumothorax   Admission Labs: WBC 9.0, HgB 12.9, plt 401 Lipase 15 Na 136, K 3.9, CO2 of 24, BUN 13, Scr 0.7, glu 272  Admission Imaging Studies: CXR There is moderate to large right pneumothorax with associated collapse of the right lung. No mediastinal shift. Left lung and left lateral costophrenic angle are clear. Stable cardio-mediastinal silhouette. No acute osseous abnormalities F/u CXR Interval placement of a small bore, pigtail thoracostomy tube  along the right upper lung zone. Improved aeration of the right lung without visualized pneumothorax. 2. Patchy airspace opacities in the right mid and lower lung fields, likely atelectasis. CT chest Small right pneumothorax with right chest tube in place. 2. Collapse or consolidation in the right lung, possibly compressive atelectasis or pneumonia. 3. Diffuse mosaic pattern to the lungs may represent motion artifact, air trapping, or edema  Significant Labs:   Significant Imaging Studies:   Antibiotic Therapy: Anti-infectives (From admission, onward)    Start     Dose/Rate Route Frequency Ordered Stop   01/02/24 1945  cefTRIAXone (ROCEPHIN) 1 g in sodium chloride  0.9 % 100 mL IVPB        1 g 200 mL/hr over 30 Minutes Intravenous  Once 01/02/24 1933 01/02/24 2256   01/02/24 1945  azithromycin  (ZITHROMAX ) 500 mg in sodium chloride   0.9 % 250 mL IVPB        500 mg 250 mL/hr over 60 Minutes Intravenous  Once 01/02/24 1933 01/03/24 0252       Procedures: 01-02-2024 right pigtail chest tube insertion  Consultants: PCCM

## 2024-01-03 NOTE — Addendum Note (Signed)
 Addendum  created 01/03/24 1106 by Vernadine Golas, MD   Attestation recorded in Intraprocedure, Intraprocedure Attestations filed

## 2024-01-03 NOTE — Assessment & Plan Note (Signed)
 01-03-2024 now has pigtail chest tube. PCCM consulting for chest tube management. Pt on chronic opiate narcotics. Will increase IV dilaudid  to 1.5 mg q2h prn severe pain. Continue with po oxycodone  10 mg q6h prn moderate pain.

## 2024-01-03 NOTE — Progress Notes (Signed)
 Subjective/Chief Complaint: Events reviewed Some abdominal discomfort but no nausea or vomiting.  Had some food this morning but unaware that she could order food for the afternoon.    Objective: Vital signs in last 24 hours: Temp:  [98 F (36.7 C)-98.5 F (36.9 C)] 98.5 F (36.9 C) (06/06 1200) Pulse Rate:  [62-87] 67 (06/06 1230) Resp:  [13-24] 19 (06/06 1230) BP: (146-179)/(75-101) 149/90 (06/06 1230) SpO2:  [92 %-100 %] 97 % (06/06 1230)     Intake/Output from previous day: 06/05 0701 - 06/06 0700 In: 364.8 [IV Piggyback:354.8] Out: -  Intake/Output this shift: No intake/output data recorded.  Alert, no apparent distress, nontoxic Symmetric chest rise Right chest tube intact.  No airleak Incentive spirometer about 750 Abdomen-obese, soft, mild appropriate tenderness, incisions okay Lab Results:  Recent Labs    01/02/24 1609 01/02/24 2341  WBC 9.0 7.8  HGB 12.9 13.1  HCT 39.9 42.4  PLT 401* 399   BMET Recent Labs    01/02/24 1609 01/02/24 2341  NA 136 135  K 3.9 4.2  CL 97* 99  CO2 24 24  GLUCOSE 272* 142*  BUN 13 11  CREATININE 0.70 0.63  CALCIUM 9.8 9.0   PT/INR No results for input(s): "LABPROT", "INR" in the last 72 hours. ABG No results for input(s): "PHART", "HCO3" in the last 72 hours.  Invalid input(s): "PCO2", "PO2"  Studies/Results: DG CHEST PORT 1 VIEW Result Date: 01/03/2024 CLINICAL DATA:  Pneumothorax EXAM: PORTABLE CHEST 1 VIEW COMPARISON:  01/02/2024 FINDINGS: Shallow inspiration. Cardiac enlargement. Right perihilar infiltration is mildly improved since prior study. Tiny residual right apical pneumothorax with right chest tube in place. Mild subcutaneous emphysema. Left lung is clear. IMPRESSION: Tiny residual right apical pneumothorax with right chest tube in place. Right perihilar infiltration demonstrates some improvement since prior study. Electronically Signed   By: Boyce Byes M.D.   On: 01/03/2024 00:16   CT Chest  W Contrast Result Date: 01/02/2024 CLINICAL DATA:  Pneumothorax.  Post chest tube placement. EXAM: CT CHEST WITH CONTRAST TECHNIQUE: Multidetector CT imaging of the chest was performed during intravenous contrast administration. RADIATION DOSE REDUCTION: This exam was performed according to the departmental dose-optimization program which includes automated exposure control, adjustment of the mA and/or kV according to patient size and/or use of iterative reconstruction technique. CONTRAST:  OMNIPAQUE  IOHEXOL  300 MG/ML  SOLN COMPARISON:  Chest radiograph 01/02/2024.  CT chest 12/20/2020 FINDINGS: Cardiovascular: Normal heart size. No pericardial effusions. Normal caliber thoracic aorta. Mediastinum/Nodes: No enlarged mediastinal, hilar, or axillary lymph nodes. Thyroid  gland, trachea, and esophagus demonstrate no significant findings. Lungs/Pleura: Small right pneumothorax with right chest tube in place. Subcutaneous emphysema in the right chest wall. Consolidation in the right lung may be due to atelectasis or pneumonia. Mosaic attenuation pattern to the lungs could be due to motion artifact or air trapping. Edema would also be a possibility. No pleural effusions. No specific parenchymal blebs are identified although shallow inspiration and motion artifact limits evaluation. Upper Abdomen: No acute abnormality. Musculoskeletal: Degenerative changes in the spine. No acute bony abnormalities. No displaced rib fractures identified. IMPRESSION: 1. Small right pneumothorax with right chest tube in place. 2. Collapse or consolidation in the right lung, possibly compressive atelectasis or pneumonia. 3. Diffuse mosaic pattern to the lungs may represent motion artifact, air trapping, or edema. Electronically Signed   By: Boyce Byes M.D.   On: 01/02/2024 19:28   DG Chest Portable 1 View Result Date: 01/02/2024 CLINICAL DATA:  confirm  chest tube placement EXAM: PORTABLE CHEST - 1 VIEW COMPARISON:  January 02, 2024  3:40 p.m. FINDINGS: Small bore, pigtail thoracostomy tube along the right upper lung zone. Improved aeration of the right lung with patchy airspace opacities in the right mid and lower lung fields. No pleural effusion or pneumothorax. Mild cardiomegaly. No acute fracture or destructive lesion. IMPRESSION: 1. Interval placement of a small bore, pigtail thoracostomy tube along the right upper lung zone. Improved aeration of the right lung without visualized pneumothorax. 2. Patchy airspace opacities in the right mid and lower lung fields, likely atelectasis. Electronically Signed   By: Rance Burrows M.D.   On: 01/02/2024 18:58   DG Chest 2 View Addendum Date: 01/02/2024 ADDENDUM REPORT: 01/02/2024 16:11 ADDENDUM: Critical Value/emergent results were called by telephone at the time of interpretation on 01/02/2024 at 3:51 pm to provider [facility] , who verbally acknowledged these results. Electronically Signed   By: Beula Brunswick M.D.   On: 01/02/2024 16:11   Result Date: 01/02/2024 CLINICAL DATA:  shortness of breath. EXAM: CHEST - 2 VIEW COMPARISON:  12/20/2020. FINDINGS: There is moderate to large right pneumothorax with associated collapse of the right lung. No mediastinal shift. Left lung and left lateral costophrenic angle are clear. Stable cardio-mediastinal silhouette. No acute osseous abnormalities. The soft tissues are within normal limits. IMPRESSION: Moderate to large right pneumothorax with associated collapse of the right lung. No mediastinal shift. Electronically Signed: By: Beula Brunswick M.D. On: 01/02/2024 15:47    Anti-infectives: Anti-infectives (From admission, onward)    Start     Dose/Rate Route Frequency Ordered Stop   01/02/24 1945  cefTRIAXone (ROCEPHIN) 1 g in sodium chloride  0.9 % 100 mL IVPB        1 g 200 mL/hr over 30 Minutes Intravenous  Once 01/02/24 1933 01/02/24 2256   01/02/24 1945  azithromycin  (ZITHROMAX ) 500 mg in sodium chloride  0.9 % 250 mL IVPB        500  mg 250 mL/hr over 60 Minutes Intravenous  Once 01/02/24 1933 01/03/24 0252       Assessment/Plan: Status post laparoscopic cholecystectomy June 4 for biliary dyskinesia Readmitted for right pneumothorax Severe obesity Hypertension Diabetes mellitus type 2 Chronic pain  Pneumothorax-etiology unclear.  We were not near the right thoracic cavity.  Given her severe hepatic steatosis right trocars were in the mid to  lower right abdomen.  Etiology could be barotrauma from a ruptured bleb for example, diaphragmatic injury, and or pneumoperitoneum.  Again we were not near the diaphragm.  Reviewed intraoperative record with 2 anesthesiologist and vital signs and ventilator settings were unremarkable. Appreciate emergency medicine assistance I think the right chest tube can be water-sealed later today. Repeat chest x-ray in a.m. Pulmonary medicine to evaluate the patient Incentive spirometer  VTE prophylaxis-SCDs, okay to start chemical DVT prophylaxis Hypertension-Home meds Diabetes mellitus type 2-Per [facility]  Appreciate TRH assist  Data reviewed: I reviewed vital signs for the past 18 hours, imaging results for the past 24 hours, hospital notes, and labs  LOS: 1 day    Aldean Hummingbird 01/03/2024

## 2024-01-03 NOTE — Assessment & Plan Note (Signed)
 01-03-2024 continue procardia , avapro  and cardura 

## 2024-01-03 NOTE — Assessment & Plan Note (Signed)
 01-03-2024 continue SSI.

## 2024-01-03 NOTE — Assessment & Plan Note (Signed)
 01-03-2024 verified PDMP.

## 2024-01-03 NOTE — Progress Notes (Signed)
 PROGRESS NOTE    Molly Archer  WUJ:[id number] DOB: [birth year] DOA: 01/02/2024 PCP: de [place], Molly J, MD  Subjective: Pt seen and examined. Developed spontaneous pneumothorax on right side yesterday. Had emergent chest tube placed by ER provider. Transferred to [facility].  Awaiting PCCM consult.  Discussed with general surgery as pt just had outpatient elective lap chole on 01-01-2024. He confirms he was no where near her diaphragm or her thoracic cavity.   Hospital Course: HPI: Molly Archer is a 43 y.o. female with hx of asthma, HTN, morbid obesity, recent hx chronic cholecystitis / bilary dyskinesia s/p lap cholecystectomy by Dr. Elvan Archer on 6/4 who is transferred from [facility] ED for pneumothorax. Reports that after her surgery she had slight amount of SOB and R sided chest discomfort. Gradual onset. This AM was much worse and had significant R chest pain and SOB with any exertion, called surgical Archer who referred to ED. Found to have a moderate-large R PTX without mediastinal shift and chest tube was placed by outside EDP.   Significant Events: Admitted 01/02/2024 for right pneumothorax   Admission Labs: WBC 9.0, HgB 12.9, plt 401 Lipase 15 Na 136, K 3.9, CO2 of 24, BUN 13, Scr 0.7, glu 272  Admission Imaging Studies: CXR There is moderate to large right pneumothorax with associated collapse of the right lung. No mediastinal shift. Left lung and left lateral costophrenic angle are clear. Stable cardio-mediastinal silhouette. No acute osseous abnormalities F/u CXR Interval placement of a small bore, pigtail thoracostomy tube  along the right upper lung zone. Improved aeration of the right lung without visualized pneumothorax. 2. Patchy airspace opacities in the right mid and lower lung fields, likely atelectasis. CT chest Small right pneumothorax with right chest tube in place. 2. Collapse or consolidation in the right lung, possibly compressive atelectasis or pneumonia. 3. Diffuse mosaic  pattern to the lungs may represent motion artifact, air trapping, or edema  Significant Labs:   Significant Imaging Studies:   Antibiotic Therapy: Anti-infectives (From admission, onward)    Start     Dose/Rate Route Frequency Ordered Stop   01/02/24 1945  cefTRIAXone (ROCEPHIN) 1 g in sodium chloride  0.9 % 100 mL IVPB        1 g 200 mL/hr over 30 Minutes Intravenous  Once 01/02/24 1933 01/02/24 2256   01/02/24 1945  azithromycin  (ZITHROMAX ) 500 mg in sodium chloride  0.9 % 250 mL IVPB        500 mg 250 mL/hr over 60 Minutes Intravenous  Once 01/02/24 1933 01/03/24 0252       Procedures: 01-02-2024 right pigtail chest tube insertion  Consultants: PCCM    Assessment and Plan: * Spontaneous pneumothorax - right side 01-03-2024 now has pigtail chest tube. PCCM consulting for chest tube management. Pt on chronic opiate narcotics. Will increase IV dilaudid  to 1.5 mg q2h prn severe pain. Continue with po oxycodone  10 mg q6h prn moderate pain.  Chronic prescription opiate use 01-03-2024 verified PDMP.  Diabetes mellitus (HCC) 01-03-2024 continue SSI.  Morbid obesity with BMI of 50.0-59.9, adult (HCC) Estimated body mass index is 55.8 kg/m as calculated from the following:   Height as of 01/01/24: 5\' 5"  (1.651 m).   Weight as of 01/01/24: 152.1 kg.   Essential hypertension 01-03-2024 continue procardia , avapro  and cardura   DVT prophylaxis: SCDs Start: 01/02/24 2350    Code Status: Full Code Family Communication: no family at bedside. Pt is decisional. Disposition Plan: return home Reason for continuing need for hospitalization: still  has chest tube to suction.  Objective: Vitals:   01/03/24 1000 01/03/24 1100 01/03/24 1200 01/03/24 1230  BP: (!) 160/96 (!) 160/98 (!) 153/92 (!) 149/90  Pulse: 72 68 64 67  Resp: 17 (!) 24 18 19   Temp:   98.5 F (36.9 C)   TempSrc:   Oral   SpO2: 100% 100% 100% 97%    Intake/Output Summary (Last 24 hours) at 01/03/2024 1348 Last  data filed at 01/03/2024 0413 Gross per 24 hour  Intake 364.78 ml  Output --  Net 364.78 ml   There were no vitals filed for this visit.  Examination:  Physical Exam Vitals and nursing note reviewed.  Constitutional:      Appearance: She is obese.  HENT:     Head: Normocephalic and atraumatic.  Eyes:     General: No scleral icterus. Cardiovascular:     Rate and Rhythm: Normal rate and regular rhythm.  Pulmonary:     Effort: Pulmonary effort is normal.     Breath sounds: Normal breath sounds.  Abdominal:     General: Bowel sounds are normal. There is no distension.  Skin:    General: Skin is warm and dry.     Capillary Refill: Capillary refill takes less than 2 seconds.  Neurological:     General: No focal deficit present.     Mental Status: She is alert and oriented to person, place, and time.     Data Reviewed: I have personally reviewed following labs and imaging studies  CBC: Recent Labs  Lab 12/30/23 1404 01/02/24 1609 01/02/24 2341  WBC 8.1 9.0 7.8  NEUTROABS  --  7.1  --   HGB 14.4 12.9 13.1  HCT 45.3 39.9 42.4  MCV 89.0 87.7 90.4  PLT 403* 401* 399   Basic Metabolic Panel: Recent Labs  Lab 12/30/23 1404 01/02/24 1609 01/02/24 2341  NA 138 136 135  K 3.7 3.9 4.2  CL 100 97* 99  CO2 26 24 24   GLUCOSE 78 272* 142*  BUN 9 13 11   CREATININE 0.63 0.70 0.63  CALCIUM 9.5 9.8 9.0  MG  --   --  1.9  PHOS  --   --  4.4   GFR: Estimated Creatinine Clearance: 138.8 mL/min (by C-G formula based on SCr of 0.63 mg/dL). Liver Function Tests: Recent Labs  Lab 01/02/24 1609  AST 24  ALT 48*  ALKPHOS 40  BILITOT 0.4  PROT 7.3  ALBUMIN 4.1   Recent Labs  Lab 01/02/24 1609  LIPASE 15   CBG: Recent Labs  Lab 01/01/24 0955 01/01/24 1218 01/03/24 0029 01/03/24 0843 01/03/24 0942  GLUCAP 141* 232* 150* 179* 192*   Sepsis Labs: Recent Labs  Lab 01/02/24 2341  PROCALCITON <0.10   Radiology Studies: DG CHEST PORT 1 VIEW Result Date:  01/03/2024 CLINICAL DATA:  Pneumothorax EXAM: PORTABLE CHEST 1 VIEW COMPARISON:  01/02/2024 FINDINGS: Shallow inspiration. Cardiac enlargement. Right perihilar infiltration is mildly improved since prior study. Tiny residual right apical pneumothorax with right chest tube in place. Mild subcutaneous emphysema. Left lung is clear. IMPRESSION: Tiny residual right apical pneumothorax with right chest tube in place. Right perihilar infiltration demonstrates some improvement since prior study. Electronically Signed   By: Boyce Byes M.D.   On: 01/03/2024 00:16   CT Chest W Contrast Result Date: 01/02/2024 CLINICAL DATA:  Pneumothorax.  Post chest tube placement. EXAM: CT CHEST WITH CONTRAST TECHNIQUE: Multidetector CT imaging of the chest was performed during intravenous contrast administration. RADIATION  DOSE REDUCTION: This exam was performed according to the departmental dose-optimization program which includes automated exposure control, adjustment of the mA and/or kV according to patient size and/or use of iterative reconstruction technique. CONTRAST:  OMNIPAQUE  IOHEXOL  300 MG/ML  SOLN COMPARISON:  Chest radiograph 01/02/2024.  CT chest 12/20/2020 FINDINGS: Cardiovascular: Normal heart size. No pericardial effusions. Normal caliber thoracic aorta. Mediastinum/Nodes: No enlarged mediastinal, hilar, or axillary lymph nodes. Thyroid  gland, trachea, and esophagus demonstrate no significant findings. Lungs/Pleura: Small right pneumothorax with right chest tube in place. Subcutaneous emphysema in the right chest wall. Consolidation in the right lung may be due to atelectasis or pneumonia. Mosaic attenuation pattern to the lungs could be due to motion artifact or air trapping. Edema would also be a possibility. No pleural effusions. No specific parenchymal blebs are identified although shallow inspiration and motion artifact limits evaluation. Upper Abdomen: No acute abnormality. Musculoskeletal: Degenerative  changes in the spine. No acute bony abnormalities. No displaced rib fractures identified. IMPRESSION: 1. Small right pneumothorax with right chest tube in place. 2. Collapse or consolidation in the right lung, possibly compressive atelectasis or pneumonia. 3. Diffuse mosaic pattern to the lungs may represent motion artifact, air trapping, or edema. Electronically Signed   By: Boyce Byes M.D.   On: 01/02/2024 19:28   DG Chest Portable 1 View Result Date: 01/02/2024 CLINICAL DATA:  confirm chest tube placement EXAM: PORTABLE CHEST - 1 VIEW COMPARISON:  January 02, 2024 3:40 p.m. FINDINGS: Small bore, pigtail thoracostomy tube along the right upper lung zone. Improved aeration of the right lung with patchy airspace opacities in the right mid and lower lung fields. No pleural effusion or pneumothorax. Mild cardiomegaly. No acute fracture or destructive lesion. IMPRESSION: 1. Interval placement of a small bore, pigtail thoracostomy tube along the right upper lung zone. Improved aeration of the right lung without visualized pneumothorax. 2. Patchy airspace opacities in the right mid and lower lung fields, likely atelectasis. Electronically Signed   By: Rance Burrows M.D.   On: 01/02/2024 18:58   DG Chest 2 View Addendum Date: 01/02/2024 ADDENDUM REPORT: 01/02/2024 16:11 ADDENDUM: Critical Value/emergent results were called by telephone at the time of interpretation on 01/02/2024 at 3:51 pm to provider [facility] , who verbally acknowledged these results. Electronically Signed   By: Beula Brunswick M.D.   On: 01/02/2024 16:11   Result Date: 01/02/2024 CLINICAL DATA:  shortness of breath. EXAM: CHEST - 2 VIEW COMPARISON:  12/20/2020. FINDINGS: There is moderate to large right pneumothorax with associated collapse of the right lung. No mediastinal shift. Left lung and left lateral costophrenic angle are clear. Stable cardio-mediastinal silhouette. No acute osseous abnormalities. The soft tissues are within normal  limits. IMPRESSION: Moderate to large right pneumothorax with associated collapse of the right lung. No mediastinal shift. Electronically Signed: By: Beula Brunswick M.D. On: 01/02/2024 15:47    Scheduled Meds:  doxazosin   1 mg Oral Daily   insulin  aspart  0-5 Units Subcutaneous QHS   insulin  aspart  0-6 Units Subcutaneous TID WC   irbesartan   300 mg Oral Daily   NIFEdipine   60 mg Oral Daily   norethindrone   15 mg Oral Daily   sodium chloride  flush  3 mL Intravenous Q12H   Continuous Infusions:  sodium chloride  75 mL/hr at 01/03/24 0140     LOS: 1 day   Time spent: 50 minutes  Unk Garb, DO  [facility]  01/03/2024, 1:48 PM

## 2024-01-03 NOTE — Subjective & Objective (Signed)
 Pt seen and examined. Developed spontaneous pneumothorax on right side yesterday. Had emergent chest tube placed by ER provider. Transferred to [facility].  Awaiting PCCM consult.  Discussed with general surgery as pt just had outpatient elective lap chole on 01-01-2024. He confirms he was no where near her diaphragm or her thoracic cavity.

## 2024-01-04 ENCOUNTER — Inpatient Hospital Stay (HOSPITAL_COMMUNITY)

## 2024-01-04 DIAGNOSIS — J9383 Other pneumothorax: Secondary | ICD-10-CM | POA: Diagnosis not present

## 2024-01-04 LAB — PROTIME-INR
INR: 1 (ref 0.8–1.2)
Prothrombin Time: 13.3 s (ref 11.4–15.2)

## 2024-01-04 LAB — GLUCOSE, CAPILLARY
Glucose-Capillary: 271 mg/dL — ABNORMAL HIGH (ref 70–99)
Glucose-Capillary: 128 mg/dL — ABNORMAL HIGH (ref 70–99)
Glucose-Capillary: 109 mg/dL — ABNORMAL HIGH (ref 70–99)
Glucose-Capillary: 151 mg/dL — ABNORMAL HIGH (ref 70–99)

## 2024-01-04 LAB — HIV ANTIBODY (ROUTINE TESTING W REFLEX): HIV Screen 4th Generation wRfx: NONREACTIVE

## 2024-01-04 MED ORDER — HYDROMORPHONE HCL 1 MG/ML IJ SOLN: 1.0000 mg | INTRAMUSCULAR | Status: DC | PRN

## 2024-01-04 MED ORDER — GLIPIZIDE ER 10 MG PO TB24
10.0000 mg | ORAL_TABLET | Freq: Every day | ORAL | Status: DC
  Administered 2024-01-04 – 2024-01-05 (×2): 10 mg via ORAL
  Filled 2024-01-04 (×2): qty 1

## 2024-01-04 MED ORDER — ACETAMINOPHEN 325 MG PO TABS
650.0000 mg | ORAL_TABLET | Freq: Four times a day (QID) | ORAL | Status: DC | PRN
  Administered 2024-01-04: 650 mg via ORAL
  Filled 2024-01-04: qty 2

## 2024-01-04 NOTE — Progress Notes (Signed)
 Repeat chest x-ray with tube clamped shows no pneumothorax. Can DC pigtail and repeat chest x-ray in a.m.  Jennifer Moellers Villa Greaser MD

## 2024-01-04 NOTE — Plan of Care (Signed)

## 2024-01-04 NOTE — Progress Notes (Signed)
 Subjective/Chief Complaint: Tolerating diet Had a BM since admission No abdominal complaints CXR today -  no PTX   Objective: Vital signs in last 24 hours: Temp:  [98.1 F (36.7 C)-98.5 F (36.9 C)] 98.2 F (36.8 C) (06/07 0846) Pulse Rate:  [62-89] 89 (06/07 0846) Resp:  [16-24] 21 (06/07 0846) BP: (131-160)/(68-98) 155/93 (06/07 0846) SpO2:  [93 %-100 %] 96 % (06/07 0846) Last BM Date : 01/03/24  Intake/Output from previous day: 06/06 0701 - 06/07 0700 In: 243 [P.O.:240; I.V.:3] Out: 44 [Chest Tube:32] Intake/Output this shift: No intake/output data recorded.  Abd incisions c/d/I No abdominal tenderness Right chest tube - no air leak   Lab Results:  Recent Labs    01/02/24 1609 01/02/24 2341  WBC 9.0 7.8  HGB 12.9 13.1  HCT 39.9 42.4  PLT 401* 399   BMET Recent Labs    01/02/24 1609 01/02/24 2341  NA 136 135  K 3.9 4.2  CL 97* 99  CO2 24 24  GLUCOSE 272* 142*  BUN 13 11  CREATININE 0.70 0.63  CALCIUM 9.8 9.0   PT/INR Recent Labs    01/04/24 0528  LABPROT 13.3  INR 1.0   ABG No results for input(s): "PHART", "HCO3" in the last 72 hours.  Invalid input(s): "PCO2", "PO2"  Studies/Results: DG Chest Port 1 View Result Date: 01/04/2024 CLINICAL DATA:  Right-sided pneumothorax.  Chest tube in place. EXAM: PORTABLE CHEST 1 VIEW COMPARISON:  Chest radiograph dated 01/03/2024. FINDINGS: Right-sided chest tube in similar position. No pneumothorax seen on this portable radiograph. There is cardiomegaly with mild vascular congestion. No acute osseous pathology. IMPRESSION: 1. Right-sided chest tube in similar position. No identifiable pneumothorax. 2. Cardiomegaly with mild vascular congestion. Electronically Signed   By: Angus Bark M.D.   On: 01/04/2024 08:34   DG CHEST PORT 1 VIEW Result Date: 01/03/2024 CLINICAL DATA:  Pneumothorax EXAM: PORTABLE CHEST 1 VIEW COMPARISON:  01/02/2024 FINDINGS: Shallow inspiration. Cardiac enlargement. Right  perihilar infiltration is mildly improved since prior study. Tiny residual right apical pneumothorax with right chest tube in place. Mild subcutaneous emphysema. Left lung is clear. IMPRESSION: Tiny residual right apical pneumothorax with right chest tube in place. Right perihilar infiltration demonstrates some improvement since prior study. Electronically Signed   By: Boyce Byes M.D.   On: 01/03/2024 00:16   CT Chest W Contrast Result Date: 01/02/2024 CLINICAL DATA:  Pneumothorax.  Post chest tube placement. EXAM: CT CHEST WITH CONTRAST TECHNIQUE: Multidetector CT imaging of the chest was performed during intravenous contrast administration. RADIATION DOSE REDUCTION: This exam was performed according to the departmental dose-optimization program which includes automated exposure control, adjustment of the mA and/or kV according to patient size and/or use of iterative reconstruction technique. CONTRAST:  OMNIPAQUE  IOHEXOL  300 MG/ML  SOLN COMPARISON:  Chest radiograph 01/02/2024.  CT chest 12/20/2020 FINDINGS: Cardiovascular: Normal heart size. No pericardial effusions. Normal caliber thoracic aorta. Mediastinum/Nodes: No enlarged mediastinal, hilar, or axillary lymph nodes. Thyroid  gland, trachea, and esophagus demonstrate no significant findings. Lungs/Pleura: Small right pneumothorax with right chest tube in place. Subcutaneous emphysema in the right chest wall. Consolidation in the right lung may be due to atelectasis or pneumonia. Mosaic attenuation pattern to the lungs could be due to motion artifact or air trapping. Edema would also be a possibility. No pleural effusions. No specific parenchymal blebs are identified although shallow inspiration and motion artifact limits evaluation. Upper Abdomen: No acute abnormality. Musculoskeletal: Degenerative changes in the spine. No acute bony abnormalities.  No displaced rib fractures identified. IMPRESSION: 1. Small right pneumothorax with right chest  tube in place. 2. Collapse or consolidation in the right lung, possibly compressive atelectasis or pneumonia. 3. Diffuse mosaic pattern to the lungs may represent motion artifact, air trapping, or edema. Electronically Signed   By: Boyce Byes M.D.   On: 01/02/2024 19:28   DG Chest Portable 1 View Result Date: 01/02/2024 CLINICAL DATA:  confirm chest tube placement EXAM: PORTABLE CHEST - 1 VIEW COMPARISON:  January 02, 2024 3:40 p.m. FINDINGS: Small bore, pigtail thoracostomy tube along the right upper lung zone. Improved aeration of the right lung with patchy airspace opacities in the right mid and lower lung fields. No pleural effusion or pneumothorax. Mild cardiomegaly. No acute fracture or destructive lesion. IMPRESSION: 1. Interval placement of a small bore, pigtail thoracostomy tube along the right upper lung zone. Improved aeration of the right lung without visualized pneumothorax. 2. Patchy airspace opacities in the right mid and lower lung fields, likely atelectasis. Electronically Signed   By: Rance Burrows M.D.   On: 01/02/2024 18:58   DG Chest 2 View Addendum Date: 01/02/2024 ADDENDUM REPORT: 01/02/2024 16:11 ADDENDUM: Critical Value/emergent results were called by telephone at the time of interpretation on 01/02/2024 at 3:51 pm to provider [facility] , who verbally acknowledged these results. Electronically Signed   By: Beula Brunswick M.D.   On: 01/02/2024 16:11   Result Date: 01/02/2024 CLINICAL DATA:  shortness of breath. EXAM: CHEST - 2 VIEW COMPARISON:  12/20/2020. FINDINGS: There is moderate to large right pneumothorax with associated collapse of the right lung. No mediastinal shift. Left lung and left lateral costophrenic angle are clear. Stable cardio-mediastinal silhouette. No acute osseous abnormalities. The soft tissues are within normal limits. IMPRESSION: Moderate to large right pneumothorax with associated collapse of the right lung. No mediastinal shift. Electronically Signed:  By: Beula Brunswick M.D. On: 01/02/2024 15:47    Anti-infectives: Anti-infectives (From admission, onward)    Start     Dose/Rate Route Frequency Ordered Stop   01/02/24 1945  cefTRIAXone  (ROCEPHIN ) 1 g in sodium chloride  0.9 % 100 mL IVPB        1 g 200 mL/hr over 30 Minutes Intravenous  Once 01/02/24 1933 01/02/24 2256   01/02/24 1945  azithromycin  (ZITHROMAX ) 500 mg in sodium chloride  0.9 % 250 mL IVPB        500 mg 250 mL/hr over 60 Minutes Intravenous  Once 01/02/24 1933 01/03/24 0252       Assessment/Plan: Status post laparoscopic cholecystectomy June 4 for biliary dyskinesia Readmitted for spontaneous right pneumothorax Severe obesity Hypertension Diabetes mellitus type 2 Chronic pain   Pneumothorax-etiology unclear.  We were not near the right thoracic cavity.  Given her severe hepatic steatosis right trocars were in the mid to  lower right abdomen.  Etiology could be barotrauma from a ruptured bleb for example, diaphragmatic injury, and or pneumoperitoneum.  Again we were not near the diaphragm.  Reviewed intraoperative record with 2 anesthesiologist and vital signs and ventilator settings were unremarkable. Appreciate emergency medicine assistance I think the right chest tube can be water-sealed later today. Pulmonary medicine to evaluate the patient and manage chest tube Incentive spirometer   VTE prophylaxis-SCDs, okay to start chemical DVT prophylaxis Hypertension-Home meds Diabetes mellitus type 2-Per [facility]   Appreciate TRH assist  LOS: 2 days    Rella Cardinal 01/04/2024

## 2024-01-04 NOTE — Progress Notes (Signed)
 PROGRESS NOTE                                                                                                                                                                                                             Patient Demographics:    Molly Archer, is a 43 y.o. female, DOB - 24-Aug-1980, ZOX:[id number]  Outpatient Primary MD for the patient is de [place], Alonza Jansky, MD    LOS - 2  Admit date - 01/02/2024    Chief Complaint  Patient presents with   Shortness of Breath   Post-op Problem       Brief Narrative (HPI from H&P)    43 y.o. female with hx of asthma, HTN, morbid obesity, recent hx chronic cholecystitis / bilary dyskinesia s/p lap cholecystectomy by Dr. Elvan Hamel on 6/4 who is transferred from [facility] ED for pneumothorax. Reports that after her surgery she had slight amount of SOB and R sided chest discomfort. Gradual onset. This AM was much worse and had significant R chest pain and SOB with any exertion, called surgical center who referred to ED. Found to have a moderate-large R PTX without mediastinal shift and chest tube was placed by outside EDP.  After chest tube placement she was admitted to the hospitalist team, PCCM was consulted for chest tube management.   Subjective:    Molly Archer today has, No headache, mild right-sided chest tube site pain, No abdominal pain - No Nausea, No new weakness tingling or numbness, no SOB   Assessment  & Plan :    Spontaneous pneumothorax - right side - 01-03-2024 now has pigtail chest tube. PCCM consulting for chest tube management. Pt on chronic opiate narcotics.  Renew pain control, pneumothorax likely happened due to positive pressure she received from intubation and mechanical ventilation for her surgical procedure, likely had an underlying bleb which ruptured.  Currently stable continue supportive care PCCM to monitor chest tube.  Chronic prescription opiate  use 01-03-2024 verified PDMP.  Minimize narcotic use here  Morbid obesity with BMI of 50.0-59.9, adult (HCC) Estimated body mass index is 55.8 kg/m follow-up with PCP for weight loss  Essential hypertension 01-03-2024 continue procardia , avapro  and cardura   Diabetes mellitus (HCC) 2 01-03-2024 continue SSI continue home Glucotrol  and monitor.  Lab Results  Component Value Date   HGBA1C 8.0 (H) 11/12/2023  CBG (last 3)  Recent Labs    01/03/24 1428 01/03/24 1722 01/03/24 2123  GLUCAP 165* 239* 206*         Condition - Fair  Family Communication  : Mother bedside on 01/04/2024  Code Status :  Full  Consults  :  PCCM  PUD Prophylaxis :    Procedures  :     Right-sided chest tube placement 01/03/2024.      Disposition Plan  :    Status is: Inpatient   DVT Prophylaxis  :    heparin  injection 5,000 Units Start: 01/03/24 1445 SCDs Start: 01/02/24 2350    Lab Results  Component Value Date   PLT 399 01/02/2024    Diet :  Diet Order             Diet Carb Modified Fluid consistency: Thin; Room service appropriate? Yes  Diet effective now                    Inpatient Medications  Scheduled Meds:  doxazosin   1 mg Oral Daily   glipiZIDE   10 mg Oral Daily   heparin  injection (subcutaneous)  5,000 Units Subcutaneous Q8H   insulin  aspart  0-5 Units Subcutaneous QHS   insulin  aspart  0-6 Units Subcutaneous TID WC   irbesartan   300 mg Oral Daily   NIFEdipine   60 mg Oral Daily   norethindrone   15 mg Oral Daily   sodium chloride  flush  3 mL Intravenous Q12H   Continuous Infusions: PRN Meds:.acetaminophen , albuterol , HYDROmorphone  (DILAUDID ) injection, ondansetron  (ZOFRAN ) IV, oxyCODONE , tiZANidine   Antibiotics  :    Anti-infectives (From admission, onward)    Start     Dose/Rate Route Frequency Ordered Stop   01/02/24 1945  cefTRIAXone  (ROCEPHIN ) 1 g in sodium chloride  0.9 % 100 mL IVPB        1 g 200 mL/hr over 30 Minutes Intravenous  Once  01/02/24 1933 01/02/24 2256   01/02/24 1945  azithromycin  (ZITHROMAX ) 500 mg in sodium chloride  0.9 % 250 mL IVPB        500 mg 250 mL/hr over 60 Minutes Intravenous  Once 01/02/24 1933 01/03/24 0252         Objective:   Vitals:   01/03/24 1230 01/03/24 1640 01/03/24 1930 01/03/24 2356  BP: (!) 149/90 131/68 (!) 157/81 (!) 158/82  Pulse: 67 74 75 69  Resp: 19 (!) 21 17 16   Temp:  98.3 F (36.8 C) 98.1 F (36.7 C) 98.3 F (36.8 C)  TempSrc:  Oral Oral Oral  SpO2: 97% 95% 93% 94%    Wt Readings from Last 3 Encounters:  01/01/24 (!) 152.1 kg  12/30/23 (!) 152.2 kg  11/18/23 (!) 152.9 kg     Intake/Output Summary (Last 24 hours) at 01/04/2024 0759 Last data filed at 01/03/2024 2211 Gross per 24 hour  Intake 243 ml  Output 32 ml  Net 211 ml     Physical Exam  Awake Alert, No new F.N deficits, Normal affect [place]kins.AT,PERRAL Supple Neck, No JVD,   Symmetrical Chest wall movement, Good air movement bilaterally, CTAB, right-sided chest tube in place RRR,No Gallops,Rubs or new Murmurs,  +ve B.Sounds, Abd Soft, No tenderness,   No Cyanosis, Clubbing or edema       Data Review:    Recent Labs  Lab 12/30/23 1404 01/02/24 1609 01/02/24 2341  WBC 8.1 9.0 7.8  HGB 14.4 12.9 13.1  HCT 45.3 39.9 42.4  PLT 403* 401* 399  MCV 89.0  87.7 90.4  MCH 28.3 28.4 27.9  MCHC 31.8 32.3 30.9  RDW 14.9 14.9 15.0  LYMPHSABS  --  1.1  --   MONOABS  --  0.8  --   EOSABS  --  0.0  --   BASOSABS  --  0.0  --     Recent Labs  Lab 12/30/23 1404 01/02/24 1609 01/02/24 2341 01/04/24 0528  NA 138 136 135  --   K 3.7 3.9 4.2  --   CL 100 97* 99  --   CO2 26 24 24   --   ANIONGAP 12 15 12   --   GLUCOSE 78 272* 142*  --   BUN 9 13 11   --   CREATININE 0.63 0.70 0.63  --   AST  --  24  --   --   ALT  --  48*  --   --   ALKPHOS  --  40  --   --   BILITOT  --  0.4  --   --   ALBUMIN  --  4.1  --   --   PROCALCITON  --   --  <0.10  --   INR  --   --   --  1.0  MG  --   --  1.9  --    PHOS  --   --  4.4  --   CALCIUM 9.5 9.8 9.0  --       Recent Labs  Lab 12/30/23 1404 01/02/24 1609 01/02/24 2341 01/04/24 0528  PROCALCITON  --   --  <0.10  --   INR  --   --   --  1.0  MG  --   --  1.9  --   CALCIUM 9.5 9.8 9.0  --     --------------------------------------------------------------------------------------------------------------- Lab Results  Component Value Date   CHOL 197 01/12/2019   HDL 29 (L) 01/12/2019   LDLCALC 146 (H) 01/12/2019   TRIG 111 01/12/2019   CHOLHDL 4.2 04/17/2018    Lab Results  Component Value Date   HGBA1C 8.0 (H) 11/12/2023   No results for input(s): "TSH", "T4TOTAL", "FREET4", "T3FREE", "THYROIDAB" in the last 72 hours. No results for input(s): "VITAMINB12", "FOLATE", "FERRITIN", "TIBC", "IRON", "RETICCTPCT" in the last 72 hours. ------------------------------------------------------------------------------------------------------------------ Cardiac Enzymes No results for input(s): "CKMB", "TROPONINI", "MYOGLOBIN" in the last 168 hours.  Invalid input(s): "CK"  Micro Results No results found for this or any previous visit (from the past 240 hours).  Radiology Report DG CHEST PORT 1 VIEW Result Date: 01/03/2024 CLINICAL DATA:  Pneumothorax EXAM: PORTABLE CHEST 1 VIEW COMPARISON:  01/02/2024 FINDINGS: Shallow inspiration. Cardiac enlargement. Right perihilar infiltration is mildly improved since prior study. Tiny residual right apical pneumothorax with right chest tube in place. Mild subcutaneous emphysema. Left lung is clear. IMPRESSION: Tiny residual right apical pneumothorax with right chest tube in place. Right perihilar infiltration demonstrates some improvement since prior study. Electronically Signed   By: Boyce Byes M.D.   On: 01/03/2024 00:16   CT Chest W Contrast Result Date: 01/02/2024 CLINICAL DATA:  Pneumothorax.  Post chest tube placement. EXAM: CT CHEST WITH CONTRAST TECHNIQUE: Multidetector CT imaging of the  chest was performed during intravenous contrast administration. RADIATION DOSE REDUCTION: This exam was performed according to the departmental dose-optimization program which includes automated exposure control, adjustment of the mA and/or kV according to patient size and/or use of iterative reconstruction technique. CONTRAST:  OMNIPAQUE  IOHEXOL  300 MG/ML  SOLN COMPARISON:  Chest radiograph 01/02/2024.  CT chest 12/20/2020 FINDINGS: Cardiovascular: Normal heart size. No pericardial effusions. Normal caliber thoracic aorta. Mediastinum/Nodes: No enlarged mediastinal, hilar, or axillary lymph nodes. Thyroid  gland, trachea, and esophagus demonstrate no significant findings. Lungs/Pleura: Small right pneumothorax with right chest tube in place. Subcutaneous emphysema in the right chest wall. Consolidation in the right lung may be due to atelectasis or pneumonia. Mosaic attenuation pattern to the lungs could be due to motion artifact or air trapping. Edema would also be a possibility. No pleural effusions. No specific parenchymal blebs are identified although shallow inspiration and motion artifact limits evaluation. Upper Abdomen: No acute abnormality. Musculoskeletal: Degenerative changes in the spine. No acute bony abnormalities. No displaced rib fractures identified. IMPRESSION: 1. Small right pneumothorax with right chest tube in place. 2. Collapse or consolidation in the right lung, possibly compressive atelectasis or pneumonia. 3. Diffuse mosaic pattern to the lungs may represent motion artifact, air trapping, or edema. Electronically Signed   By: Boyce Byes M.D.   On: 01/02/2024 19:28   DG Chest Portable 1 View Result Date: 01/02/2024 CLINICAL DATA:  confirm chest tube placement EXAM: PORTABLE CHEST - 1 VIEW COMPARISON:  January 02, 2024 3:40 p.m. FINDINGS: Small bore, pigtail thoracostomy tube along the right upper lung zone. Improved aeration of the right lung with patchy airspace opacities in the  right mid and lower lung fields. No pleural effusion or pneumothorax. Mild cardiomegaly. No acute fracture or destructive lesion. IMPRESSION: 1. Interval placement of a small bore, pigtail thoracostomy tube along the right upper lung zone. Improved aeration of the right lung without visualized pneumothorax. 2. Patchy airspace opacities in the right mid and lower lung fields, likely atelectasis. Electronically Signed   By: Rance Burrows M.D.   On: 01/02/2024 18:58   DG Chest 2 View Addendum Date: 01/02/2024 ADDENDUM REPORT: 01/02/2024 16:11 ADDENDUM: Critical Value/emergent results were called by telephone at the time of interpretation on 01/02/2024 at 3:51 pm to provider [facility] , who verbally acknowledged these results. Electronically Signed   By: Beula Brunswick M.D.   On: 01/02/2024 16:11   Result Date: 01/02/2024 CLINICAL DATA:  shortness of breath. EXAM: CHEST - 2 VIEW COMPARISON:  12/20/2020. FINDINGS: There is moderate to large right pneumothorax with associated collapse of the right lung. No mediastinal shift. Left lung and left lateral costophrenic angle are clear. Stable cardio-mediastinal silhouette. No acute osseous abnormalities. The soft tissues are within normal limits. IMPRESSION: Moderate to large right pneumothorax with associated collapse of the right lung. No mediastinal shift. Electronically Signed: By: Beula Brunswick M.D. On: 01/02/2024 15:47     Signature  -   Lynnwood Sauer M.D on 01/04/2024 at 7:59 AM   -  To page go to [email or web address]

## 2024-01-04 NOTE — Evaluation (Signed)
 Physical Therapy Brief Evaluation and Discharge Note Patient Details Name: Molly Archer MRN: [id number] DOB: 1980-09-17 Today's Date: 01/04/2024   History of Present Illness  43 year old underwent lap cholecystectomy on 6/4.  She developed shortness of breath and right-sided chest discomfort. Presented 6/5 where she was found to have a large right pneumothorax with collapsed lung, pigtail was placed.   Clinical Impression  Patient evaluated by Physical Therapy with no further acute PT needs identified. Ambulates slowly but without evidence of loss of balance. Mild dyspnea, SpO2 upper 90s on room air throughout session, HR 70s-90s. Prefers RW for support. Educated on use for confidence, stability, and efficiency with gait. Seek clarification for any work restrictions (eg. Lifting,) from medical team prior to d/c. May benefit from OPPT follow-up to help with conditioning and return to PLOF. [facility] in the past.  All education has been completed and the patient has no further questions.  See below for any follow-up Physical Therapy or equipment needs. PT is signing off. Thank you for this referral.        PT Assessment All further PT needs can be met in the next venue of care  Assistance Needed at Discharge  PRN    Equipment Recommendations Rolling walker (2 wheels)  Recommendations for Other Services       Precautions/Restrictions Precautions Precautions: None Recall of Precautions/Restrictions: Intact Precaution/Restrictions Comments: Rt chest tube Restrictions Weight Bearing Restrictions Per Provider Order: No        Mobility  Bed Mobility Rolling: Modified independent (Device/Increase time) Supine/Sidelying to sit: Modified independent (Device/Increased time) Sit to supine/sidelying: Modified independent (Device/Increased time) General bed mobility comments: extra time. No assist needed  Transfers Overall transfer level: Modified  independent Equipment used: None, Rolling walker (2 wheels)               General transfer comment: Performed with and without assistive device. no physical assist needed. Educated on safety and chest but placement awareness.    Ambulation/Gait Ambulation/Gait assistance: Supervision Gait Distance (Feet): 150 Feet Assistive device: Rolling walker (2 wheels) Gait Pattern/deviations: Step-through pattern, Decreased stride length Gait Speed: Below normal General Gait Details: Slow gait, prefers RW for support. No evidence of LOB or buckling. SpO2 upper 90s throughout session, Mild dyspnea.  Home Activity Instructions Home Activity Instructions: Frequent bouts of ambulation, initially with RW as needed for confidence, support, efficency. Awareness of exertion. Avoid heavy lifting - get clarification from pulmonologist regarding absolute restrictions.  Stairs Stairs:  (Declines, feels confident she can manage and mother available to assist if needed. Strength adequate.)          Modified Rankin (Stroke Patients Only)        Balance Overall balance assessment: Mild deficits observed, not formally tested Sitting-balance support: No upper extremity supported, Feet supported Sitting balance-Leahy Scale: Normal     Standing balance support: No upper extremity supported Standing balance-Leahy Scale: Good            Pertinent Vitals/Pain PT - Brief Vital Signs All Vital Signs Stable: Yes (SpO2 98% on RA. HR 76.) Pain Assessment Pain Assessment: Faces Faces Pain Scale: Hurts little more Pain Location: chest tube site Pain Descriptors / Indicators: Aching Pain Intervention(s): Monitored during session, Repositioned, Limited activity within patient's tolerance     Home Living Family/patient expects to be discharged to:: Private residence Living Arrangements: Parent Available Help at Discharge: Family;Available 24 hours/day (For a temporary period of time as  needed.) Home Environment: Stairs  to enter  Stairs-Number of Steps: 1 Home Equipment: None   Additional Comments: 1Lvl home. tub with grab bar, standard toilets,d rives. 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    Prior Function Level of Independence: Independent Comments: working, desk job with some lifting duties.    UE/LE Assessment   UE ROM/Strength/Tone/Coordination: WFL    LE ROM/Strength/Tone/Coordination: [facility]      Communication   Communication Communication: No apparent difficulties     Cognition Overall Cognitive Status: Appears within functional limits for tasks assessed/performed       General Comments General comments (skin integrity, edema, etc.): VSS throughout. Void on BSC +. Able to peform self care.    Exercises     Assessment/Plan    PT Problem List Decreased activity tolerance;Decreased balance;Pain       PT Visit Diagnosis Difficulty in walking, not elsewhere classified (R26.2);Pain    No Skilled PT     Co-evaluation                AMPAC 6 Clicks Help needed turning from your back to your side while in a flat bed without using bedrails?: None Help needed moving from lying on your back to sitting on the side of a flat bed without using bedrails?: None Help needed moving to and from a bed to a chair (including a wheelchair)?: None Help needed standing up from a chair using your arms (e.g., wheelchair or bedside chair)?: None Help needed to walk in hospital room?: A Little Help needed climbing 3-5 steps with a railing? : A Little 6 Click Score: 22      End of Session   Activity Tolerance: Patient tolerated treatment well Patient left: in bed;with call bell/phone within reach;with family/visitor present Nurse Communication: Mobility status PT Visit Diagnosis: Difficulty in walking, not elsewhere classified (R26.2);Pain Pain - Right/Left: Right Pain - part of body:  (chest tube site)     Time: 1610-9604 PT Time  Calculation (min) (ACUTE ONLY): 24 min  Charges:   PT Evaluation $PT Eval Low Complexity: 1 Low PT Treatments $Gait Training: 8-22 mins    Jory Ng, PT, DPT [facility]  Rehabilitation Services Physical Therapist Office: [phone] Website: [email or web address]   Alinda Irani  01/04/2024, 3:34 PM

## 2024-01-04 NOTE — Progress Notes (Signed)
 NAME:  Molly Archer, MRN:  [id number], DOB:  1981-02-28, LOS: 2 ADMISSION DATE:  01/02/2024, CONSULTATION DATE:  01/04/2024  REFERRING MD:  Farrel Hones, TRH, CHIEF COMPLAINT:  pneumothorax   History of Present Illness:  43 year old never smoker with history of chronic cystitis underwent lap cholecystectomy on 6/4.  She developed shortness of breath and right-sided chest discomfort.  And call the surgical center who referred her to the ED. She was found to have a large right pneumothorax with collapsed lung, pigtail was placed in the ED She was transferred to [facility] for admission. PCCM consulted.  CT chest showed small right pneumothorax with expanded lung and mild subcutaneous emphysema, there was consolidation in the right lung   Pertinent  Medical History  asthma,  HTN,  morbid obesity   Significant Hospital Events: Including procedures, antibiotic start and stop dates in addition to other pertinent events     Interim History / Subjective:   Breathing better No chest pain  Objective    Blood pressure (!) 171/84, pulse 79, temperature 98 F (36.7 C), temperature source Oral, resp. rate 20, last menstrual period 06/30/2018, SpO2 97%.        Intake/Output Summary (Last 24 hours) at 01/04/2024 1349 Last data filed at 01/03/2024 2211 Gross per 24 hour  Intake 243 ml  Output 32 ml  Net 211 ml   There were no vitals filed for this visit.  Examination: Gen. Pleasant, morbidly obese, in no distress, normal affect ENT - no pallor,icterus, no post nasal drip, class 2 airway Neck: No JVD, no thyromegaly, no carotid bruits Lungs: no use of accessory muscles, no dullness to percussion, decreased without rales or rhonchi  Cardiovascular: S1-S2 regular, distant Abdomen: soft and non-tender, no hepatosplenomegaly, BS normal. Musculoskeletal: No deformities, no cyanosis or clubbing Neuro:  alert, non focal, no tremors No airleak on pigtail   Chest x-ray 6/7 shows expanded lung with  noticeable    Resolved problem list   Assessment and Plan   Right spontaneous pneumothorax after lap cholecystectomy on 6/4 - Reason unclear, no intraoperative events noted, appreciate surgical input. -CT chest does not demonstrate clear blebs or apical emphysema.  - Tolerated waterseal without air leak, will clamp chest tube, repeat chest x-ray at 3 PM and if no recurrent pneumothorax, proceed with discontinue chest tube    Likely OSA Morbid obesity  - Hold off CPAP for now    Best Practice (right click and "Reselect all SmartList Selections" daily)    Code Status:  full code Last date of multidisciplinary goals of care discussion [NA]  Labs   CBC: Recent Labs  Lab 12/30/23 1404 01/02/24 1609 01/02/24 2341  WBC 8.1 9.0 7.8  NEUTROABS  --  7.1  --   HGB 14.4 12.9 13.1  HCT 45.3 39.9 42.4  MCV 89.0 87.7 90.4  PLT 403* 401* 399    Basic Metabolic Panel: Recent Labs  Lab 12/30/23 1404 01/02/24 1609 01/02/24 2341  NA 138 136 135  K 3.7 3.9 4.2  CL 100 97* 99  CO2 26 24 24   GLUCOSE 78 272* 142*  BUN 9 13 11   CREATININE 0.63 0.70 0.63  CALCIUM 9.5 9.8 9.0  MG  --   --  1.9  PHOS  --   --  4.4   GFR: Estimated Creatinine Clearance: 138.8 mL/min (by C-G formula based on SCr of 0.63 mg/dL). Recent Labs  Lab 12/30/23 1404 01/02/24 1609 01/02/24 2341  PROCALCITON  --   --  <  0.10  WBC 8.1 9.0 7.8    Liver Function Tests: Recent Labs  Lab 01/02/24 1609  AST 24  ALT 48*  ALKPHOS 40  BILITOT 0.4  PROT 7.3  ALBUMIN 4.1   Recent Labs  Lab 01/02/24 1609  LIPASE 15   No results for input(s): "AMMONIA" in the last 168 hours.  ABG    Component Value Date/Time   TCO2 25 06/12/2016 0759     Coagulation Profile: Recent Labs  Lab 01/04/24 0528  INR 1.0    Cardiac Enzymes: No results for input(s): "CKTOTAL", "CKMB", "CKMBINDEX", "TROPONINI" in the last 168 hours.  HbA1C: HbA1c POC (<> result, manual entry)  Date/Time Value Ref Range  Status  07/16/2023 03:29 PM 7.9 4.0 - 5.6 % Final   Hgb A1c MFr Bld  Date/Time Value Ref Range Status  11/12/2023 04:29 PM 8.0 (H) 4.8 - 5.6 % Final    Comment:             Prediabetes: 5.7 - 6.4          Diabetes: >6.4          Glycemic control for adults with diabetes: <7.0   03/04/2023 04:19 PM 7.7 (H) 4.8 - 5.6 % Final    Comment:             Prediabetes: 5.7 - 6.4          Diabetes: >6.4          Glycemic control for adults with diabetes: <7.0     CBG: Recent Labs  Lab 01/03/24 1428 01/03/24 1722 01/03/24 2123 01/04/24 0910 01/04/24 1156  GLUCAP 165* 239* 206* 271* 128*       Celene Coins MD. FCCP. [facility] Pager : [phone]  If no response to pager , please call [phone] until 7 pm After 7:00 pm call Elink  [phone]   01/04/2024

## 2024-01-04 NOTE — Progress Notes (Signed)
 Pt had a complete bed bath, able to transfer to bed to [facility] and tried to walk going to the sink to brush her teeth. Dyspnea upon exertion is noted, saturation within normal limits during ambulation. Asked if she needs supplemental oxygen but opted not to and have some rest. IS at bedside, encouraged to do it most of the day. Dressing changed on the chest tube site as ordered. No significant output noted on her chest tube drainage system.

## 2024-01-04 NOTE — Progress Notes (Signed)
 Right pigtail CT dc'd per MD order.

## 2024-01-05 ENCOUNTER — Inpatient Hospital Stay (HOSPITAL_COMMUNITY)

## 2024-01-05 DIAGNOSIS — J9311 Primary spontaneous pneumothorax: Secondary | ICD-10-CM | POA: Diagnosis not present

## 2024-01-05 LAB — GLUCOSE, CAPILLARY: Glucose-Capillary: 126 mg/dL — ABNORMAL HIGH (ref 70–99)

## 2024-01-05 MED ORDER — HYDRALAZINE HCL 10 MG PO TABS
10.0000 mg | ORAL_TABLET | Freq: Once | ORAL | Status: AC
  Administered 2024-01-05: 10 mg via ORAL
  Filled 2024-01-05: qty 1

## 2024-01-05 NOTE — Discharge Instructions (Signed)
 Follow all the previous instructions provided by a general surgeon during gallbladder removal unchanged.  Follow-up with your general surgeon within a week of discharge.  Follow with Primary MD de [place], Raymond J, MD in 7 days   Get CBC, CMP, 2 view Chest X ray -  checked next visit with your primary MD    Activity: As tolerated with Full fall precautions use walker/cane & assistance as needed  Disposition Home    Diet: Heart Healthy low carbohydrate diet.  Check CBGs q. [facility] S.  Special Instructions: If you have smoked or chewed Tobacco  in the last 2 yrs please stop smoking, stop any regular Alcohol  and or any Recreational drug use.  On your next visit with your primary care physician please Get Medicines reviewed and adjusted.  Please request your Prim.MD to go over all Hospital Tests and Procedure/Radiological results at the follow up, please get all Hospital records sent to your Prim MD by signing hospital release before you go home.  If you experience worsening of your admission symptoms, develop shortness of breath, life threatening emergency, suicidal or homicidal thoughts you must seek medical attention immediately by calling 911 or calling your MD immediately  if symptoms less severe.  You Must read complete instructions/literature along with all the possible adverse reactions/side effects for all the Medicines you take and that have been prescribed to you. Take any new Medicines after you have completely understood and accpet all the possible adverse reactions/side effects.   Do not drive when taking Pain medications.  Do not take more than prescribed Pain, Sleep and Anxiety Medications  Wear Seat belts while driving.

## 2024-01-05 NOTE — TOC Transition Note (Signed)
 Transition of Care [facility]) - Discharge Note   Patient Details  Name: Molly Archer MRN: [id number] Date of Birth: 06/26/1981  Transition of [facility]) CM/SW Contact:  Omie Bickers, RN Phone Number: 01/05/2024, 8:21 AM   Clinical Narrative:     Spoke w patient to discuss DC needs. She would like RW to bedside. Bariatric RW ordered through [employer] to bedside.  She is agreeable to referral to OP OT and she would like [facility]. Referral placed.   Final next level of care: Home/Self Care Barriers to Discharge: No Barriers Identified   Patient Goals and CMS Choice Patient states their goals for this hospitalization and ongoing recovery are:: to go home CMS Medicare.gov Compare Post Acute Care list provided to:: Patient Choice offered to / list presented to : Patient      Discharge Placement                       Discharge Plan and Services Additional resources added to the After Visit Summary for                  DME Arranged: Walker rolling DME Agency: [employer] Date DME Agency Contacted: 01/05/24 Time DME Agency Contacted: [phone] Representative spoke with at DME Agency: Zula Hitch            Social Drivers of Health (SDOH) Interventions SDOH Screenings   Food Insecurity: Food Insecurity Present (01/04/2024)  Housing: High Risk (01/04/2024)  Transportation Needs: No Transportation Needs (01/04/2024)  Utilities: Not At Risk (01/04/2024)  Alcohol Screen: Low Risk  (04/16/2023)  Depression (PHQ2-9): Low Risk  (11/06/2023)  Recent Concern: Depression (PHQ2-9) - High Risk (08/26/2023)  Financial Resource Strain: High Risk (07/13/2023)  Physical Activity: Insufficiently Active (07/13/2023)  Social Connections: Moderately Isolated (07/13/2023)  Stress: Stress Concern Present (07/13/2023)  Tobacco Use: Low Risk  (01/01/2024)  Health Literacy: Adequate Health Literacy (04/16/2023)     Readmission Risk Interventions     No data to display

## 2024-01-05 NOTE — Discharge Summary (Signed)
 Molly Archer:[id number] DOB: 01/01/1981 DOA: 01/02/2024  PCP: de Peru, Raymond J, MD  Admit date: 01/02/2024  Discharge date: 01/05/2024  Admitted From: Home   Disposition:  Home   Recommendations for Outpatient Follow-up:   Follow up with PCP in 1-2 weeks  PCP Please obtain BMP/CBC, 2 view CXR in 1week,  (see Discharge instructions)   PCP Please follow up on the following pending results:     Home Health: None   Equipment/Devices: None  Consultations: PCCM Discharge Condition: Stable     CODE STATUS: Full     Diet Recommendation: Heart Healthy Low Carb    Chief Complaint  Patient presents with   Shortness of Breath   Post-op Problem     Brief history of present illness from the day of admission and additional interim summary     43 y.o. female with hx of asthma, HTN, morbid obesity, recent hx chronic cholecystitis / bilary dyskinesia s/p lap cholecystectomy by Dr. Elvan Hamel on 6/4 who is transferred from [facility] ED for pneumothorax. Reports that after her surgery she had slight amount of SOB and R sided chest discomfort. Gradual onset. This AM was much worse and had significant R chest pain and SOB with any exertion, called surgical center who referred to ED. Found to have a moderate-large R PTX without mediastinal shift and chest tube was placed by outside EDP.  After chest tube placement she was admitted to the hospitalist team, PCCM was consulted for chest tube management.                                                                   Hospital Course   Spontaneous pneumothorax - right side - 01-03-2024 now has pigtail chest tube. PCCM consulting for chest tube management. Pt on chronic opiate narcotics.  Renew pain control, pneumothorax likely happened due to positive pressure she received from  intubation and mechanical ventilation for her surgical procedure, likely had an underlying bleb which ruptured.  Currently stable, catheter was removed evening of 01/04/2024, case discussed with Dr. Teddi Favors pulmonary on 01/05/2024 who has personally reviewed this morning's chest x-ray, he has informed me that patient can be discharged home.  Patient is symptom-free will be discharged home on her home medications.  No changes made.  Follow-up with PCP in a week and a general surgeon within 5 to 7 days.   Recent cholecystectomy.  No acute issues follow-up with general surgery within a week of discharge.    Chronic prescription opiate use No change in home regimen   Morbid obesity with BMI of 50.0-59.9, adult (HCC) Estimated body mass index is 55.8 kg/m follow-up with PCP for weight loss   Essential hypertension continue procardia , avapro  and cardura    Diabetes mellitus (HCC) 2 No acute issues continue home  regimen.  Discharge diagnosis     Principal Problem:   Primary spontaneous pneumothorax Active Problems:   Essential hypertension   Morbid obesity with BMI of 50.0-59.9, adult (HCC)   Diabetes mellitus (HCC)   Chronic prescription opiate use    Discharge instructions    Discharge Instructions     Discharge instructions   Complete by: As directed    Follow all the previous instructions provided by a general surgeon during gallbladder removal unchanged.  Follow-up with your general surgeon within a week of discharge.  Follow with Primary MD de Peru, Raymond J, MD in 7 days   Get CBC, CMP, 2 view Chest X ray -  checked next visit with your primary MD    Activity: As tolerated with Full fall precautions use walker/cane & assistance as needed  Disposition Home    Diet: Heart Healthy low carbohydrate diet.  Check CBGs q. [facility] S.  Special Instructions: If you have smoked or chewed Tobacco  in the last 2 yrs please stop smoking, stop any regular Alcohol  and or any Recreational drug  use.  On your next visit with your primary care physician please Get Medicines reviewed and adjusted.  Please request your Prim.MD to go over all Hospital Tests and Procedure/Radiological results at the follow up, please get all Hospital records sent to your Prim MD by signing hospital release before you go home.  If you experience worsening of your admission symptoms, develop shortness of breath, life threatening emergency, suicidal or homicidal thoughts you must seek medical attention immediately by calling 911 or calling your MD immediately  if symptoms less severe.  You Must read complete instructions/literature along with all the possible adverse reactions/side effects for all the Medicines you take and that have been prescribed to you. Take any new Medicines after you have completely understood and accpet all the possible adverse reactions/side effects.   Do not drive when taking Pain medications.  Do not take more than prescribed Pain, Sleep and Anxiety Medications  Wear Seat belts while driving.   Increase activity slowly   Complete by: As directed        Discharge Medications   Allergies as of 01/05/2024       Reactions   Codeine Hives   Doxycycline  Nausea Only   Pt states causes nausea and vomiting.   Losartan Other (See Comments)   headache   Penicillins Hives   Tramadol  Hives        Medication List     STOP taking these medications    Fluocinolone  Acetonide Scalp 0.01 % Oil   ipratropium-albuterol  0.5-2.5 (3) MG/3ML Soln Commonly known as: DUONEB   Naftifine  HCl 2 % Crea       TAKE these medications    acetaminophen  500 MG tablet Commonly known as: TYLENOL  Take 2 tablets (1,000 mg total) by mouth every 8 (eight) hours for 5 days.   ALLERGY EYE OP Place 1 drop into both eyes daily as needed (allergies).   Ciclopirox  1 % shampoo Apply 1 each topically at bedtime.   clobetasol  ointment 0.05 % Commonly known as: TEMOVATE  Apply topically 2 (two)  times daily. What changed:  how much to take when to take this reasons to take this   diclofenac  75 MG EC tablet Commonly known as: VOLTAREN  Take 1 tablet (75 mg total) by mouth 2 (two) times daily with a meal.   doxazosin  1 MG tablet Commonly known as: CARDURA  Take 1 tablet (1 mg total) by mouth  daily.   glipiZIDE  10 MG 24 hr tablet Commonly known as: GLUCOTROL  XL Take 1 tablet (10 mg total) by mouth daily.   hydrochlorothiazide  12.5 MG capsule Commonly known as: MICROZIDE  Take 1 capsule (12.5 mg total) by mouth daily.   irbesartan -hydrochlorothiazide  300-12.5 MG tablet Commonly known as: AVALIDE Take 1 tablet by mouth daily.   lidocaine  2 % solution Commonly known as: XYLOCAINE  Use as directed 15 mLs in the mouth or throat every 4 (four) hours as needed.   NIFEdipine  60 MG 24 hr tablet Commonly known as: PROCARDIA  XL/NIFEDICAL XL Take 1 tablet (60 mg total) by mouth daily.   norethindrone  5 MG tablet Commonly known as: AYGESTIN  Take 3 tablets (15 mg total) by mouth daily.   Oxycodone  HCl 10 MG Tabs Take 1 tablet (10 mg total) by mouth every 8 (eight) hours as needed.   Precision QID Test test strip Generic drug: glucose blood Check glucose twice daily as needed.   tiZANidine  4 MG tablet Commonly known as: ZANAFLEX  Take 1 tablet (4 mg total) by mouth every 8 (eight) hours as needed for muscle spasms.   triamcinolone  cream 0.1 % Commonly known as: KENALOG  Apply 1 Application topically 2 (two) times daily. What changed:  when to take this reasons to take this   Ventolin  HFA 108 (90 Base) MCG/ACT inhaler Generic drug: albuterol  Inhale 2 puffs into the lungs every 4 (four) hours as needed for wheezing or shortness of breath (cough, shortness of breath or wheezing.).   albuterol  108 (90 Base) MCG/ACT inhaler Commonly known as: VENTOLIN  HFA Inhale 2 puffs into the lungs every 4 (four) hours as needed for wheezing or shortness of breath (cough, shortness of  breath or wheezing.).   zolpidem  5 MG tablet Commonly known as: AMBIEN  Take 1 tablet (5 mg total) by mouth at bedtime as needed for sleep.         Follow-up Information     de [place], Alonza Jansky, MD. Schedule an appointment as soon as possible for a visit in 1 week(s).   Specialty: Family Medicine Contact information: [address] [place] [address] [phone]         [facility], [place]. Schedule an appointment as soon as possible for a visit in 1 week(s).   Specialty: General Surgery Contact information: [address] [place]  [address] [phone]                Major procedures and Radiology Reports - PLEASE review detailed and final reports thoroughly  -      DG CHEST PORT 1 VIEW Result Date: 01/04/2024 CLINICAL DATA:  Follow-up right pneumothorax. EXAM: PORTABLE CHEST 1 VIEW COMPARISON:  Earlier today. FINDINGS: Stable enlarged cardiac silhouette. Stable right pleural pigtail catheter with no visible pneumothorax. Clear lungs with normal vascularity. Moderate lower thoracic spine degenerative changes. IMPRESSION: 1. Stable right pleural pigtail catheter with no visible pneumothorax. 2. Stable cardiomegaly. 3. No acute abnormality. Electronically Signed   By: Catherin Closs M.D.   On: 01/04/2024 16:42   DG Chest Port 1 View Result Date: 01/04/2024 CLINICAL DATA:  Right-sided pneumothorax.  Chest tube in place. EXAM: PORTABLE CHEST 1 VIEW COMPARISON:  Chest radiograph dated 01/03/2024. FINDINGS: Right-sided chest tube in similar position. No pneumothorax seen on this portable radiograph. There is cardiomegaly with mild vascular congestion. No acute osseous pathology. IMPRESSION: 1. Right-sided chest tube in similar position. No identifiable pneumothorax. 2. Cardiomegaly with mild vascular congestion. Electronically Signed   By:  Angus Bark M.D.   On: 01/04/2024 08:34   DG CHEST PORT 1 VIEW Result Date:  01/03/2024 CLINICAL DATA:  Pneumothorax EXAM: PORTABLE CHEST 1 VIEW COMPARISON:  01/02/2024 FINDINGS: Shallow inspiration. Cardiac enlargement. Right perihilar infiltration is mildly improved since prior study. Tiny residual right apical pneumothorax with right chest tube in place. Mild subcutaneous emphysema. Left lung is clear. IMPRESSION: Tiny residual right apical pneumothorax with right chest tube in place. Right perihilar infiltration demonstrates some improvement since prior study. Electronically Signed   By: Boyce Byes M.D.   On: 01/03/2024 00:16   CT Chest W Contrast Result Date: 01/02/2024 CLINICAL DATA:  Pneumothorax.  Post chest tube placement. EXAM: CT CHEST WITH CONTRAST TECHNIQUE: Multidetector CT imaging of the chest was performed during intravenous contrast administration. RADIATION DOSE REDUCTION: This exam was performed according to the departmental dose-optimization program which includes automated exposure control, adjustment of the mA and/or kV according to patient size and/or use of iterative reconstruction technique. CONTRAST:  OMNIPAQUE  IOHEXOL  300 MG/ML  SOLN COMPARISON:  Chest radiograph 01/02/2024.  CT chest 12/20/2020 FINDINGS: Cardiovascular: Normal heart size. No pericardial effusions. Normal caliber thoracic aorta. Mediastinum/Nodes: No enlarged mediastinal, hilar, or axillary lymph nodes. Thyroid  gland, trachea, and esophagus demonstrate no significant findings. Lungs/Pleura: Small right pneumothorax with right chest tube in place. Subcutaneous emphysema in the right chest wall. Consolidation in the right lung may be due to atelectasis or pneumonia. Mosaic attenuation pattern to the lungs could be due to motion artifact or air trapping. Edema would also be a possibility. No pleural effusions. No specific parenchymal blebs are identified although shallow inspiration and motion artifact limits evaluation. Upper Abdomen: No acute abnormality. Musculoskeletal: Degenerative  changes in the spine. No acute bony abnormalities. No displaced rib fractures identified. IMPRESSION: 1. Small right pneumothorax with right chest tube in place. 2. Collapse or consolidation in the right lung, possibly compressive atelectasis or pneumonia. 3. Diffuse mosaic pattern to the lungs may represent motion artifact, air trapping, or edema. Electronically Signed   By: Boyce Byes M.D.   On: 01/02/2024 19:28   DG Chest Portable 1 View Result Date: 01/02/2024 CLINICAL DATA:  confirm chest tube placement EXAM: PORTABLE CHEST - 1 VIEW COMPARISON:  January 02, 2024 3:40 p.m. FINDINGS: Small bore, pigtail thoracostomy tube along the right upper lung zone. Improved aeration of the right lung with patchy airspace opacities in the right mid and lower lung fields. No pleural effusion or pneumothorax. Mild cardiomegaly. No acute fracture or destructive lesion. IMPRESSION: 1. Interval placement of a small bore, pigtail thoracostomy tube along the right upper lung zone. Improved aeration of the right lung without visualized pneumothorax. 2. Patchy airspace opacities in the right mid and lower lung fields, likely atelectasis. Electronically Signed   By: Rance Burrows M.D.   On: 01/02/2024 18:58   DG Chest 2 View Addendum Date: 01/02/2024 ADDENDUM REPORT: 01/02/2024 16:11 ADDENDUM: Critical Value/emergent results were called by telephone at the time of interpretation on 01/02/2024 at 3:51 pm to provider [facility] , who verbally acknowledged these results. Electronically Signed   By: Beula Brunswick M.D.   On: 01/02/2024 16:11   Result Date: 01/02/2024 CLINICAL DATA:  shortness of breath. EXAM: CHEST - 2 VIEW COMPARISON:  12/20/2020. FINDINGS: There is moderate to large right pneumothorax with associated collapse of the right lung. No mediastinal shift. Left lung and left lateral costophrenic angle are clear. Stable cardio-mediastinal silhouette. No acute osseous abnormalities. The soft tissues are within normal  limits. IMPRESSION: Moderate to large right pneumothorax with associated collapse of the right lung. No mediastinal shift. Electronically Signed: By: Beula Brunswick M.D. On: 01/02/2024 15:47   NM Hepato W/EjeCT Fract Result Date: 12/11/2023 CLINICAL DATA:  Abdominal pain EXAM: NUCLEAR MEDICINE HEPATOBILIARY IMAGING WITH GALLBLADDER EF TECHNIQUE: Sequential images of the abdomen were obtained out to 60 minutes following intravenous administration of radiopharmaceutical. After oral ingestion of Ensure, gallbladder ejection fraction was determined. At 60 min, normal ejection fraction is greater than 33%. RADIOPHARMACEUTICALS:  5.5 mCi Tc-24m  Choletec  IV COMPARISON:  CT October 01, 2023 FINDINGS: Prompt uptake and biliary excretion of activity by the liver is seen. Gallbladder activity is visualized, consistent with patency of cystic duct. Biliary activity passes into small bowel, consistent with patent common bile duct. Calculated gallbladder ejection fraction is 2%. (Normal gallbladder ejection fraction with Ensure is greater than 33% and less than 80%.) IMPRESSION: Reduced gallbladder ejection fraction as can be seen with chronic cholecystitis/biliary dyskinesia. Electronically Signed   By: Tama Fails M.D.   On: 12/11/2023 15:07    Micro Results    No results found for this or any previous visit (from the past 240 hours).  Today   Subjective    Niko Kreps today has no headache,no chest abdominal pain,no new weakness tingling or numbness, feels much better wants to go home today.    Objective   Blood pressure (!) 157/83, pulse 79, temperature 98 F (36.7 C), temperature source Oral, resp. rate 19, last menstrual period 06/30/2018, SpO2 95%.   Intake/Output Summary (Last 24 hours) at 01/05/2024 0803 Last data filed at 01/04/2024 2223 Gross per 24 hour  Intake 3 ml  Output 700 ml  Net -697 ml    Exam  Awake Alert, No new F.N deficits,    [place].AT,PERRAL Supple Neck,   Symmetrical  Chest wall movement, Good air movement bilaterally, CTAB RRR,No Gallops,   +ve B.Sounds, Abd Soft, Non tender,  No Cyanosis, Clubbing or edema    Data Review   Recent Labs  Lab 12/30/23 1404 01/02/24 1609 01/02/24 2341  WBC 8.1 9.0 7.8  HGB 14.4 12.9 13.1  HCT 45.3 39.9 42.4  PLT 403* 401* 399  MCV 89.0 87.7 90.4  MCH 28.3 28.4 27.9  MCHC 31.8 32.3 30.9  RDW 14.9 14.9 15.0  LYMPHSABS  --  1.1  --   MONOABS  --  0.8  --   EOSABS  --  0.0  --   BASOSABS  --  0.0  --     Recent Labs  Lab 12/30/23 1404 01/02/24 1609 01/02/24 2341 01/04/24 0528  NA 138 136 135  --   K 3.7 3.9 4.2  --   CL 100 97* 99  --   CO2 26 24 24   --   ANIONGAP 12 15 12   --   GLUCOSE 78 272* 142*  --   BUN 9 13 11   --   CREATININE 0.63 0.70 0.63  --   AST  --  24  --   --   ALT  --  48*  --   --   ALKPHOS  --  40  --   --   BILITOT  --  0.4  --   --   ALBUMIN  --  4.1  --   --   PROCALCITON  --   --  <0.10  --   INR  --   --   --  1.0  MG  --   --  1.9  --   PHOS  --   --  4.4  --   CALCIUM 9.5 9.8 9.0  --     Total Time in preparing paper work, data evaluation and todays exam - 35 minutes  Signature  -    Lynnwood Sauer M.D on 01/05/2024 at 8:03 AM   -  To page go to [email or web address]

## 2024-01-05 NOTE — Plan of Care (Signed)

## 2024-01-06 ENCOUNTER — Other Ambulatory Visit (HOSPITAL_BASED_OUTPATIENT_CLINIC_OR_DEPARTMENT_OTHER): Payer: Self-pay

## 2024-01-06 ENCOUNTER — Ambulatory Visit (HOSPITAL_BASED_OUTPATIENT_CLINIC_OR_DEPARTMENT_OTHER): Admitting: Family Medicine

## 2024-01-06 ENCOUNTER — Encounter (HOSPITAL_BASED_OUTPATIENT_CLINIC_OR_DEPARTMENT_OTHER): Payer: Self-pay | Admitting: Family Medicine

## 2024-01-06 ENCOUNTER — Telehealth: Payer: Self-pay

## 2024-01-06 ENCOUNTER — Telehealth (HOSPITAL_BASED_OUTPATIENT_CLINIC_OR_DEPARTMENT_OTHER): Payer: Self-pay | Admitting: *Deleted

## 2024-01-06 VITALS — BP 154/82 | HR 96 | Ht 65.0 in | Wt 340.1 lb

## 2024-01-06 DIAGNOSIS — J9311 Primary spontaneous pneumothorax: Secondary | ICD-10-CM | POA: Diagnosis not present

## 2024-01-06 DIAGNOSIS — R5381 Other malaise: Secondary | ICD-10-CM | POA: Diagnosis not present

## 2024-01-06 DIAGNOSIS — E1165 Type 2 diabetes mellitus with hyperglycemia: Secondary | ICD-10-CM | POA: Diagnosis not present

## 2024-01-06 DIAGNOSIS — I10 Essential (primary) hypertension: Secondary | ICD-10-CM

## 2024-01-06 MED ORDER — LANCET DEVICE MISC
1.0000 | Freq: Three times a day (TID) | 0 refills | Status: AC
  Filled 2024-01-06: qty 1, 30d supply, fill #0

## 2024-01-06 MED ORDER — ACCU-CHEK SOFTCLIX LANCETS MISC
1.0000 | Freq: Three times a day (TID) | 0 refills | Status: DC
  Filled 2024-01-06: qty 100, 33d supply, fill #0

## 2024-01-06 MED ORDER — BLOOD GLUCOSE MONITOR SYSTEM W/DEVICE KIT
1.0000 | PACK | Freq: Three times a day (TID) | 0 refills | Status: AC
  Filled 2024-01-06: qty 1, 30d supply, fill #0

## 2024-01-06 MED ORDER — BLOOD GLUCOSE TEST VI STRP
1.0000 | ORAL_STRIP | Freq: Three times a day (TID) | 0 refills | Status: DC
  Filled 2024-01-06: qty 100, 34d supply, fill #0

## 2024-01-06 MED ORDER — INSULIN GLARGINE-YFGN 100 UNIT/ML ~~LOC~~ SOPN
10.0000 [IU] | PEN_INJECTOR | Freq: Every day | SUBCUTANEOUS | 1 refills | Status: DC
  Filled 2024-01-06: qty 9, 90d supply, fill #0
  Filled 2024-02-04: qty 6, 60d supply, fill #1

## 2024-01-06 NOTE — Patient Instructions (Signed)
  Medication Instructions:  Your physician recommends that you continue on your current medications as directed. Please refer to the Current Medication list given to you today. --If you need a refill on any your medications before your next appointment, please call your pharmacy first. If no refills are authorized on file call the office.-- Lab Work: Your physician has recommended that you have lab work today: fasting 1 week  If you have labs (blood work) drawn today and your tests are completely normal, you will receive your results via MyChart message OR a phone call from our staff.  Please ensure you check your voicemail in the event that you authorized detailed messages to be left on a delegated number. If you have any lab test that is abnormal or we need to change your treatment, we will call you to review the results.  Referrals/Procedures/Imaging: Chest xray 1 week  STOP GLIPIZIDE  START INSULIN   Follow-Up: Your next appointment:   Your physician recommends that you schedule a follow-up appointment in: 3-4 week follow up  with Dr. de Peru  You will receive a text message or e-mail with a link to a survey about your care and experience with us  today! We would greatly appreciate your feedback!   Thanks for letting us  be apart of your health journey!!  Primary Care and Sports Medicine   Dr. Court Distance Peru   We encourage you to activate your patient portal called "MyChart".  Sign up information is provided on this After Visit Summary.  MyChart is used to connect with patients for Virtual Visits (Telemedicine).  Patients are able to view lab/test results, encounter notes, upcoming appointments, etc.  Non-urgent messages can be sent to your provider as well. To learn more about what you can do with MyChart, please visit --  [email or web address].

## 2024-01-06 NOTE — Progress Notes (Unsigned)
 Procedures performed today:    None.  Independent interpretation of notes and tests performed by another provider:   None.  Brief History, Exam, Impression, and Recommendations:    BP (!) 154/82 (BP Location: Right Arm, Patient Position: Sitting, Cuff Size: Normal)   Pulse 96   Ht 5' 5 (1.651 m)   Wt (!) 340 lb 1.6 oz (154.3 kg)   LMP 06/30/2018 (Exact Date)   SpO2 97%   BMI 56.60 kg/m   Patient recently had cholecystectomy performed and subsequently developed spontaneous pneumothorax.  She presented to the emergency department due to chest pain and trouble breathing.  She ultimately had chest tube placed for treatment of pneumothorax.  She did well with this and eventually did have chest tube removed.  Today, she is doing fairly well.  She does have questions about healing at the abdominal incision sites as well as where chest tube was placed.  She has not noticed any specific issues, has had some mild redness around abdominal incision sites.  Denies any significant pain, bleeding or discharge. She was discharged home with rolling walker.  She has felt somewhat weaker and with lower energy since returning home from the hospital.  She did have physical therapy in the hospital and does report that it was mentioned about possibly having physical therapy once that she left the hospital.  Type 2 diabetes mellitus with hyperglycemia, without long-term current use of insulin  [facility]) Assessment & Plan: Patient continues to have elevated hemoglobin A1c, did increase slightly since prior check.  She has been reluctant to have further adjustment in pharmacotherapy.  We did discuss options today given current medication regimen.  She would be open to long-acting insulin  at this time.  We did initially look to do GLP-1 receptor agonist in the past, however she was having ongoing GI symptoms and thus this was not pursued. We can proceed with initiation of basal insulin , instructed on proper use.   Instructed on monitoring fasting blood sugar every morning.  We discussed gradual titration of insulin  every 3 to 4 days based on average fasting blood sugars.  With starting insulin , recommend stopping glipizide .  Ultimately, she would prefer to not have to remain on insulin  long-term.  We discussed considerations in this regard. Will plan to follow-up closely on progress with this.  Orders: -     CBC with Differential/Platelet; Future -     Basic metabolic panel with GFR; Future -     Blood Glucose Monitor System; Use as directed in the morning, at noon, and at bedtime.  Dispense: 1 kit; Refill: 0 -     Blood Glucose Test; Use 1 each in the morning, at noon, and at bedtime.  Dispense: 100 strip; Refill: 0 -     Lancet Device; 1 each by Does not apply route in the morning, at noon, and at bedtime. May substitute to any manufacturer covered by patient's insurance.  Dispense: 1 each; Refill: 0 -     Accu-Chek Softclix Lancets; Use 1 each in the morning, at noon, and at bedtime.  Dispense: 100 each; Refill: 0  Primary spontaneous pneumothorax Assessment & Plan: Currently doing well since discharge from the hospital.  Recommendation to obtain chest x-ray for follow-up 1 to 2 weeks after discharge.  She was very recently discharged and thus we will hold off on chest x-ray and she will return to have this done next week.  Orders: -     DG Chest 2 View; Future  Essential hypertension Assessment & Plan: Blood pressure elevated in office today, did improve on recheck but remains above goal.  Can continue with current regimen, continue with scheduled follow-up with cardiology No changes to medications today Recommend intermittent monitoring of blood pressure at home, DASH diet  Orders: -     CBC with Differential/Platelet; Future -     Basic metabolic panel with GFR; Future  Physical deconditioning Assessment & Plan: Discussed option related to deconditioning experienced while in the hospital,  she would be open to a referral to physical therapy and would like to proceed with this.  Referral placed today.  Orders: -     Ambulatory referral to Physical Therapy  Other orders -     Insulin  Glargine-yfgn; Inject 10 Units into the skin daily.  Dispense: 15 mL; Refill: 1  Return in about 4 weeks (around 02/03/2024) for diabetes, med check.  Spent 43 minutes on this patient encounter, including preparation, chart review, face-to-face counseling with patient and coordination of care, and documentation of encounter   ___________________________________________ Gaetano Romberger de Peru, MD, ABFM, [facility] Primary Care and Sports Medicine [facility]

## 2024-01-06 NOTE — Telephone Encounter (Signed)
 Copied from CRM [id number]. Topic: Appointments - Appointment Scheduling >> Jan 06, 2024  8:15 AM Emmet Harm C wrote: Patient/patient representative is calling to schedule an appointment. Refer to attachments for appointment information. Patient had Gallbladder surgery last thurday and had a collapse lung was told to schedule with primary  for following week and Dr. De Peru doesn;t have any available until July patient would like a call concerning making the appointment

## 2024-01-06 NOTE — Telephone Encounter (Signed)
 Patient sent mychart message please review that message

## 2024-01-06 NOTE — Transitions of Care (Post Inpatient/ED Visit) (Signed)
 01/06/2024  Name: Molly Archer MRN: [id number] DOB: 14-Aug-1980  Today's TOC FU Call Status: Today's TOC FU Call Status:: Successful TOC FU Call Completed TOC FU Call Complete Date: 01/06/24 Patient's Name and Date of Birth confirmed.  Transition Care Management Follow-up Telephone Call Date of Discharge: 01/05/24 Discharge Facility: [facility]) Type of Discharge: Inpatient Admission Primary Inpatient Discharge Diagnosis:: Spontaneous pneumothorax How have you been since you were released from the hospital?: Better Any questions or concerns?: No  Items Reviewed: Did you receive and understand the discharge instructions provided?: Yes Medications obtained,verified, and reconciled?: Yes (Medications Reviewed) Any new allergies since your discharge?: No Dietary orders reviewed?: Yes Type of Diet Ordered:: heart healthy, low carb diet Do you have support at home?: Yes People in Home [RPT]: parent(s) Name of Support/Comfort Primary Source: Belva Boyden  Medications Reviewed Today: Medications Reviewed Today     Reviewed by Vanetta Generous, RN (Registered Nurse) on 01/06/24 at 1105  Med List Status: <None>   Medication Order Taking? Sig Documenting Provider Last Dose Status Informant  acetaminophen  (TYLENOL ) 500 MG tablet [id number] Yes Take 2 tablets (1,000 mg total) by mouth every 8 (eight) hours for 5 days. Aldean Hummingbird, MD Taking Active Self, Pharmacy Records  albuterol  (VENTOLIN  [facility]) 108 [phone] Base) MCG/ACT inhaler [id number] No Inhale 2 puffs into the lungs every 4 (four) hours as needed for wheezing or shortness of breath (cough, shortness of breath or wheezing.).  Patient not taking: Reported on 01/06/2024   [place], Alonza Jansky, MD Not Taking Active Self, Pharmacy Records  albuterol  (VENTOLIN  HFA) 108 [phone] Base) MCG/ACT inhaler [id number] No Inhale 2 puffs into the lungs every 4 (four) hours as needed for wheezing or shortness of breath (cough, shortness of breath or wheezing.).   Patient not taking: Reported on 01/03/2024   [place], Alonza Jansky, MD Not Taking Active Self, Pharmacy Records  Ciclopirox  1 % shampoo [id number] No Apply 1 each topically at bedtime.  Patient not taking: Reported on 01/06/2024   [place], Alonza Jansky, MD Not Taking Active Self, Pharmacy Records           Med Note (ROSE, Minna Amass Jan 06, 2024 11:01 AM) Uses every 2 month   clobetasol  ointment (TEMOVATE ) 0.05 % [id number] No Apply topically 2 (two) times daily.  Patient not taking: Reported on 01/06/2024   [place], Alonza Jansky, MD Not Taking Active Self, Pharmacy Records           Med Note (ROSE, Minna Amass Jan 06, 2024 11:02 AM) Every 2 months with hair appointment  diclofenac  (VOLTAREN ) 75 MG EC tablet [id number] No Take 1 tablet (75 mg total) by mouth 2 (two) times daily with a meal.  Patient not taking: Reported on 01/03/2024   Rawland Caddy, MD Not Taking Active Self, Pharmacy Records  doxazosin  (CARDURA ) 1 MG tablet [id number] Yes Take 1 tablet (1 mg total) by mouth daily. Nonda Bays, FNP Taking Active Self, Pharmacy Records  glipiZIDE  (GLUCOTROL  XL) 10 MG 24 hr tablet [id number] Yes Take 1 tablet (10 mg total) by mouth daily. [place], Alonza Jansky, MD Taking Active Self, Pharmacy Records  glucose blood (PRECISION QID TEST) test strip [id number] Yes Check glucose twice daily as needed. [provider] Taking Active Self, Pharmacy Records  hydrochlorothiazide  (MICROZIDE ) 12.5 MG capsule [id number] Yes Take 1 capsule (12.5 mg total) by mouth daily. [place], Alonza Jansky, MD Taking Active Self, Pharmacy Records  irbesartan -hydrochlorothiazide  (AVALIDE) 300-12.5 MG tablet [id number] Yes Take 1 tablet by mouth daily. [place], Raymond J, MD Taking Active Self, Pharmacy Records  lidocaine  (XYLOCAINE ) 2 % solution [id number] No Use as directed 15 mLs in the mouth or throat every 4 (four) hours as needed.  Patient not taking: Reported on 01/06/2024    Not Taking Active Self, Pharmacy Records   Naphazoline-Pheniramine (ALLERGY EYE OP) [id number] No Place 1 drop into both eyes daily as needed (allergies).  Patient not taking: Reported on 01/06/2024   [provider] Not Taking Active Self, Pharmacy Records  NIFEdipine  (PROCARDIA  XL/NIFEDICAL XL) 60 MG 24 hr tablet [id number] Yes Take 1 tablet (60 mg total) by mouth daily. [place], Raymond J, MD Taking Active Self, Pharmacy Records  norethindrone  (AYGESTIN ) 5 MG tablet [id number] Yes Take 3 tablets (15 mg total) by mouth daily. Anyanwu, Ugonna A, MD Taking Active Self, Pharmacy Records  Oxycodone  HCl 10 MG TABS [id number] Yes Take 1 tablet (10 mg total) by mouth every 8 (eight) hours as needed. Rawland Caddy, MD Taking Active Self, Pharmacy Records  tiZANidine  (ZANAFLEX ) 4 MG tablet [id number] No Take 1 tablet (4 mg total) by mouth every 8 (eight) hours as needed for muscle spasms.  Patient not taking: Reported on 01/06/2024   [place], Alonza Jansky, MD Not Taking Active Self, Pharmacy Records  triamcinolone  cream (KENALOG ) 0.1 % [id number] No Apply 1 Application topically 2 (two) times daily.  Patient not taking: Reported on 01/06/2024   [place], Alonza Jansky, MD Not Taking Active Self, Pharmacy Records           Med Note (ROSE, Minna Amass Jan 06, 2024 11:05 AM) Kent Pear as needed  zolpidem  (AMBIEN ) 5 MG tablet [id number] No Take 1 tablet (5 mg total) by mouth at bedtime as needed for sleep.  Patient not taking: Reported on 01/06/2024   [place], Alonza Jansky, MD Not Taking Active Self, Pharmacy Records            Home Care and Equipment/Supplies: Were Home Health Services Ordered?: No (Patient to discuss with PCP at [facility] today.) Any new equipment or medical supplies ordered?: Yes Name of Medical supply agency?: [employer] Were you able to get the equipment/medical supplies?: Yes Do you have any questions related to the use of the equipment/supplies?: No  Functional Questionnaire: Do you need assistance with bathing/showering or  dressing?: No Do you need assistance with meal preparation?: No Do you need assistance with eating?: No Do you have difficulty maintaining continence: No Do you need assistance with getting out of bed/getting out of a chair/moving?: No Do you have difficulty managing or taking your medications?: No  Follow up appointments reviewed: PCP Follow-up appointment confirmed?: Yes Date of PCP follow-up appointment?: 01/06/24 Follow-up Provider: [place] [facility] Follow-up appointment confirmed?: No Reason Specialist Follow-Up Not Confirmed: Patient has Specialist Provider Number and will Call for Appointment Do you need transportation to your follow-up appointment?: No Do you understand care options if your condition(s) worsen?: Yes-patient verbalized understanding  SDOH Interventions Today    Flowsheet Row Most Recent Value  SDOH Interventions   Food Insecurity Interventions Intervention Not Indicated  Housing Interventions Intervention Not Indicated  Transportation Interventions Intervention Not Indicated  Utilities Interventions Intervention Not Indicated      Patient reports that she is doing ok.  Reports breathing better.  Interventions:  Encouraged patient to follow her DM diet and the education materials that she received from nutritionist and  dietician. Reviewed goal for A1c. Reviewed how pain medications can cause constipations Reviewed s/s of infection Reviewed fall prevention.  Reviewed importance of follow up with PCP and specialist. Reviewed all medications and ensured patient knows how to take her medications.  Reviewed with patient questions to ask MD today at office visit: (1) dressing changes, (2) does she need to self monitoring CBG's. ( 3) PT /OT referrals  Reviewed when to seek emergency medical attention.   Reviewed and offered 30 Day TOC program and patient reports that she is self managing well.  Provided my contact information.  Orpha Blade, RN, BSN,  CEN [employer]- Transition of Care Team.  [facility] [phone]

## 2024-01-07 ENCOUNTER — Encounter (HOSPITAL_BASED_OUTPATIENT_CLINIC_OR_DEPARTMENT_OTHER): Payer: Self-pay | Admitting: Family Medicine

## 2024-01-07 ENCOUNTER — Other Ambulatory Visit (HOSPITAL_BASED_OUTPATIENT_CLINIC_OR_DEPARTMENT_OTHER): Payer: Self-pay

## 2024-01-08 ENCOUNTER — Ambulatory Visit (HOSPITAL_COMMUNITY)
Admission: RE | Admit: 2024-01-08 | Discharge: 2024-01-08 | Disposition: A | Discharge status: Home / Self Care | Source: Ambulatory Visit | Attending: Vascular Surgery | Admitting: Vascular Surgery

## 2024-01-08 ENCOUNTER — Other Ambulatory Visit (HOSPITAL_BASED_OUTPATIENT_CLINIC_OR_DEPARTMENT_OTHER): Payer: Self-pay

## 2024-01-08 ENCOUNTER — Other Ambulatory Visit (HOSPITAL_COMMUNITY): Payer: Self-pay | Admitting: General Surgery

## 2024-01-08 ENCOUNTER — Other Ambulatory Visit (HOSPITAL_BASED_OUTPATIENT_CLINIC_OR_DEPARTMENT_OTHER): Payer: Self-pay | Admitting: *Deleted

## 2024-01-08 ENCOUNTER — Ambulatory Visit (INDEPENDENT_AMBULATORY_CARE_PROVIDER_SITE_OTHER): Admitting: Podiatry

## 2024-01-08 ENCOUNTER — Encounter: Payer: Self-pay | Admitting: Podiatry

## 2024-01-08 DIAGNOSIS — M7989 Other specified soft tissue disorders: Secondary | ICD-10-CM | POA: Diagnosis present

## 2024-01-08 DIAGNOSIS — M7662 Achilles tendinitis, left leg: Secondary | ICD-10-CM | POA: Diagnosis not present

## 2024-01-08 DIAGNOSIS — R5381 Other malaise: Secondary | ICD-10-CM | POA: Insufficient documentation

## 2024-01-08 MED ORDER — PEN NEEDLES 32G X 4 MM MISC
2 refills | Status: AC
  Filled 2024-01-08: qty 100, 90d supply, fill #0
  Filled 2024-04-02: qty 100, 90d supply, fill #1
  Filled 2024-07-05: qty 100, 90d supply, fill #2

## 2024-01-08 NOTE — Progress Notes (Signed)
  Subjective:  Patient ID: Molly Archer, female    DOB: 02-14-1981,   MRN: [id number]  Chief Complaint  Patient presents with   Foot Pain    Rm 21 Patient is here for a follow-up for left foot achilles tendon. Patient states pain is severe when walking. Patient states swelling in left foot. Patient was using heel insert with no relief.Patient discharged from the hospital 01/07/24.    43 y.o. female presents for follow-up of left achilles tendonitis. Relates she is not doing much better and still getting pain. Heel lifts did not make much of a difference.  Denies any other pedal complaints. Denies n/v/f/c.   Past Medical History:  Diagnosis Date   Allergy    Anemia    pt states she took iron for this around 2019, and has had no issues since   Arthritis    knees   Asthma    only uses inhaler in fall and spring   Constipation    Diabetes mellitus without complication (HCC)    GERD (gastroesophageal reflux disease)    diet controlled, occ. uses omeprazole    Headache    otc med prn   Heart murmur    History of kidney stones    Hx of Bell's palsy 12/2011   Hypertension    Menometrorrhagia    Psoriasis    Shortness of breath dyspnea    with exercise/exertion   Vitamin D  deficiency     Objective:  Physical Exam: Vascular: DP/PT pulses 2/4 bilateral. CFT <3 seconds. Normal hair growth on digits. No edema.  Skin. No lacerations or abrasions bilateral feet.  Musculoskeletal: MMT 5/5 bilateral lower extremities in DF, PF, Inversion and Eversion. Deceased ROM in DF of ankle joint. Tender to insertion of achilles tednon on the left foot and pain with DF. No pain to medial calcaneal tubercle or with calcaneal squeeze.  Neurological: Sensation intact to light touch.   Assessment:   1. Achilles tendinitis, left leg       Plan:  Patient was evaluated and treated and all questions answered. -Xrays reviewed. No acute fractures or dislocations noted. Spurring noted to posterior  heel on the left.  -Discussed Achilles insertional tendonitis and treatment options with patient.  -Discussed trying PT referral sent.  Continue stretching and lifts as needed.  -Discussed if no improvement will consider MRI/PT/EPAT/PRP injections.  -Patient to return to office in 2 months for recechk.    Jennefer Moats, DPM

## 2024-01-08 NOTE — Assessment & Plan Note (Signed)
 Discussed option related to deconditioning experienced while in the hospital, she would be open to a referral to physical therapy and would like to proceed with this.  Referral placed today.

## 2024-01-08 NOTE — Assessment & Plan Note (Signed)
 Currently doing well since discharge from the hospital.  Recommendation to obtain chest x-ray for follow-up 1 to 2 weeks after discharge.  She was very recently discharged and thus we will hold off on chest x-ray and she will return to have this done next week.

## 2024-01-08 NOTE — Assessment & Plan Note (Signed)
 Patient continues to have elevated hemoglobin A1c, did increase slightly since prior check.  She has been reluctant to have further adjustment in pharmacotherapy.  We did discuss options today given current medication regimen.  She would be open to long-acting insulin  at this time.  We did initially look to do GLP-1 receptor agonist in the past, however she was having ongoing GI symptoms and thus this was not pursued. We can proceed with initiation of basal insulin , instructed on proper use.  Instructed on monitoring fasting blood sugar every morning.  We discussed gradual titration of insulin  every 3 to 4 days based on average fasting blood sugars.  With starting insulin , recommend stopping glipizide .  Ultimately, she would prefer to not have to remain on insulin  long-term.  We discussed considerations in this regard. Will plan to follow-up closely on progress with this.

## 2024-01-08 NOTE — Assessment & Plan Note (Signed)
 Blood pressure elevated in office today, did improve on recheck but remains above goal.  Can continue with current regimen, continue with scheduled follow-up with cardiology No changes to medications today Recommend intermittent monitoring of blood pressure at home, DASH diet

## 2024-01-09 ENCOUNTER — Ambulatory Visit: Payer: Self-pay | Admitting: General Surgery

## 2024-01-10 ENCOUNTER — Other Ambulatory Visit (HOSPITAL_BASED_OUTPATIENT_CLINIC_OR_DEPARTMENT_OTHER): Payer: Self-pay

## 2024-01-10 NOTE — Telephone Encounter (Signed)
 Please see most recent mychart sent by pt and advise.

## 2024-01-13 ENCOUNTER — Ambulatory Visit (HOSPITAL_BASED_OUTPATIENT_CLINIC_OR_DEPARTMENT_OTHER)

## 2024-01-13 ENCOUNTER — Encounter: Payer: Self-pay | Admitting: Gastroenterology

## 2024-01-13 ENCOUNTER — Other Ambulatory Visit (HOSPITAL_BASED_OUTPATIENT_CLINIC_OR_DEPARTMENT_OTHER): Payer: Self-pay

## 2024-01-13 ENCOUNTER — Ambulatory Visit: Admitting: Gastroenterology

## 2024-01-13 ENCOUNTER — Telehealth (HOSPITAL_BASED_OUTPATIENT_CLINIC_OR_DEPARTMENT_OTHER): Payer: Self-pay | Admitting: *Deleted

## 2024-01-13 ENCOUNTER — Other Ambulatory Visit (HOSPITAL_BASED_OUTPATIENT_CLINIC_OR_DEPARTMENT_OTHER): Payer: Self-pay | Admitting: *Deleted

## 2024-01-13 VITALS — BP 120/68 | HR 88 | Ht 65.0 in | Wt 342.0 lb

## 2024-01-13 DIAGNOSIS — K5903 Drug induced constipation: Secondary | ICD-10-CM | POA: Diagnosis not present

## 2024-01-13 DIAGNOSIS — R110 Nausea: Secondary | ICD-10-CM

## 2024-01-13 DIAGNOSIS — R6881 Early satiety: Secondary | ICD-10-CM | POA: Diagnosis not present

## 2024-01-13 DIAGNOSIS — E1165 Type 2 diabetes mellitus with hyperglycemia: Secondary | ICD-10-CM | POA: Diagnosis not present

## 2024-01-13 DIAGNOSIS — J9311 Primary spontaneous pneumothorax: Secondary | ICD-10-CM

## 2024-01-13 DIAGNOSIS — I10 Essential (primary) hypertension: Secondary | ICD-10-CM

## 2024-01-13 DIAGNOSIS — R1013 Epigastric pain: Secondary | ICD-10-CM

## 2024-01-13 DIAGNOSIS — K5900 Constipation, unspecified: Secondary | ICD-10-CM

## 2024-01-13 DIAGNOSIS — K219 Gastro-esophageal reflux disease without esophagitis: Secondary | ICD-10-CM

## 2024-01-13 DIAGNOSIS — Z7984 Long term (current) use of oral hypoglycemic drugs: Secondary | ICD-10-CM

## 2024-01-13 DIAGNOSIS — T402X5A Adverse effect of other opioids, initial encounter: Secondary | ICD-10-CM

## 2024-01-13 LAB — CBC WITH DIFFERENTIAL/PLATELET
Basophils Absolute: 0 10*3/uL (ref 0.0–0.2)
Basos: 1 %
EOS (ABSOLUTE): 0.1 10*3/uL (ref 0.0–0.4)
Eos: 1 %
Hematocrit: 40.8 % (ref 34.0–46.6)
Hemoglobin: 12.8 g/dL (ref 11.1–15.9)
Immature Grans (Abs): 0 10*3/uL (ref 0.0–0.1)
Immature Granulocytes: 0 %
Lymphocytes Absolute: 1.5 10*3/uL (ref 0.7–3.1)
Lymphs: 17 %
MCH: 28.1 pg (ref 26.6–33.0)
MCHC: 31.4 g/dL — ABNORMAL LOW (ref 31.5–35.7)
MCV: 90 fL (ref 79–97)
Monocytes Absolute: 0.5 10*3/uL (ref 0.1–0.9)
Monocytes: 6 %
Neutrophils Absolute: 6.5 10*3/uL (ref 1.4–7.0)
Neutrophils: 75 %
Platelets: 501 10*3/uL — ABNORMAL HIGH (ref 150–450)
RBC: 4.56 x10E6/uL (ref 3.77–5.28)
RDW: 14.1 % (ref 11.7–15.4)
WBC: 8.7 10*3/uL (ref 3.4–10.8)

## 2024-01-13 MED ORDER — PANTOPRAZOLE SODIUM 40 MG PO TBEC
40.0000 mg | DELAYED_RELEASE_TABLET | Freq: Every day | ORAL | 3 refills | Status: AC
  Filled 2024-01-13: qty 90, 90d supply, fill #0
  Filled 2024-04-02: qty 90, 90d supply, fill #1

## 2024-01-13 NOTE — Progress Notes (Signed)
 Chief Complaint: Multiple GI symptoms Primary GI MD: Dr. Elvin Hammer  HPI: Discussed the use of AI scribe software for clinical note transcription with the patient, who gave verbal consent to proceed.  History of Present Illness Molly Archer is a 43 year old female who presents with ongoing constipation and abdominal pain.  She underwent a cholecystectomy on June 4th due to gallbladder dysfunction with a HIDA scan showing 2% function. Postoperatively, she experienced a pneumothorax, necessitating hospitalization until last Sunday. Since the surgery, she has had a reduced appetite, attributing it to her body's recovery.  She continues to experience constipation despite previous treatment with Linzess, which initially caused diarrhea but later became ineffective. She is currently taking Miralax  once daily, which has resulted in more regular bowel movements, though she still occasionally passes 'hard balls'.  She has persistent burning abdominal pain from the upper abdomen to the lower chest, which she associates with not taking pantoprazole  regularly since her surgery. Prior to surgery, she was on pantoprazole  once daily. The burning sensation is constant, occurring throughout the day and upon waking.  She also experiences significant back pain, which she believes may be due to a strain during a recent shower. The nerve pain down her leg has improved, but the back pain persists.   PREVIOUS GI WORKUP   HIDA scan with gallbladder ejection fraction 2%  Normal gastric emptying study  CT renal stone study 10/01/2023 - Colonic diverticulosis without diverticulitis - Gallbladder unremarkable - Liver unremarkable   RUQ US  10/01/2023 - Normal RUQ ultrasound without stones and normal liver.  Past Medical History:  Diagnosis Date   Allergy    Anemia    pt states she took iron for this around 2019, and has had no issues since   Arthritis    knees   Asthma    only uses inhaler in fall and  spring   Constipation    Diabetes mellitus without complication (HCC)    GERD (gastroesophageal reflux disease)    diet controlled, occ. uses omeprazole    Headache    otc med prn   Heart murmur    History of kidney stones    Hx of Bell's palsy 12/2011   Hypertension    Menometrorrhagia    Psoriasis    Shortness of breath dyspnea    with exercise/exertion   Vitamin D  deficiency     Past Surgical History:  Procedure Laterality Date   CHOLECYSTECTOMY N/A 01/01/2024   Procedure: LAPAROSCOPIC CHOLECYSTECTOMY;  Surgeon: Aldean Hummingbird, MD;  [facility] OR;  Service: General;  Laterality: N/A;   COMBINED HYSTEROSCOPY DIAGNOSTIC / D&C N/A 07/30/2009   Benign secretory endometrium   CYSTOSCOPY WITH STENT PLACEMENT Left 10/19/2014   Procedure: CYSTOSCOPY WITH STENT PLACEMENT;  Surgeon: Andrez Banker, MD;  Location: [facility] ORS;  Service: Urology;  Laterality: Left;   DILATION AND CURETTAGE OF UTERUS  10/29/2003   Polyp, mild atypia, simple and complex hyperplasia,   DILATION AND CURETTAGE OF UTERUS  11/28/2003   Simple and complex hyperplasia   DILATION AND CURETTAGE OF UTERUS  07/30/2009   DILATION AND CURETTAGE OF UTERUS N/A 12/14/2014   Procedure: DILATATION AND CURETTAGE;  Surgeon: Verlyn Goad, MD;  Location: [facility] ORS;  Service: Gynecology;  Laterality: N/A;   HOLMIUM LASER APPLICATION Left 10/19/2014   Procedure: HOLMIUM LASER APPLICATION;  Surgeon: Andrez Banker, MD;  Location: [facility];  Service: Urology;  Laterality: Left;   HYSTEROSCOPY WITH D & C N/A 07/21/2018   Procedure: DILATATION  AND CURETTAGE /HYSTEROSCOPY;  Surgeon: Tresia Fruit, MD;  Location: [facility];  Service: Gynecology;  Laterality: N/A;   INTRAUTERINE DEVICE (IUD) INSERTION N/A 12/14/2014   Procedure: INTRAUTERINE DEVICE (IUD) INSERTION;  Surgeon: Verlyn Goad, MD;  Location: [facility] OR[facility];  Service: Gynecology;  Laterality: N/A;   KNEE CARTILAGE SURGERY Left    STONE EXTRACTION WITH BASKET Left 10/19/2014    Procedure: STONE EXTRACTION WITH BASKET;  Surgeon: Andrez Banker, MD;  Location: [facility];  Service: Urology;  Laterality: Left;   WISDOM TOOTH EXTRACTION      Current Outpatient Medications  Medication Sig Dispense Refill   Accu-Chek Softclix Lancets lancets Use 1 each in the morning, at noon, and at bedtime. 100 each 0   albuterol  (VENTOLIN  HFA) 108 (90 Base) MCG/ACT inhaler Inhale 2 puffs into the lungs every 4 (four) hours as needed for wheezing or shortness of breath (cough, shortness of breath or wheezing.). 18 g 1   Blood Glucose Monitoring Suppl (BLOOD GLUCOSE MONITOR SYSTEM) w/Device KIT Use as directed in the morning, at noon, and at bedtime. 1 kit 0   Ciclopirox  1 % shampoo Apply 1 each topically at bedtime. 120 mL 0   clobetasol  ointment (TEMOVATE ) 0.05 % Apply topically 2 (two) times daily. 30 g 1   diclofenac  (VOLTAREN ) 75 MG EC tablet Take 1 tablet (75 mg total) by mouth 2 (two) times daily with a meal. 60 tablet 3   doxazosin  (CARDURA ) 1 MG tablet Take 1 tablet (1 mg total) by mouth daily. 30 tablet 3   Glucose Blood (BLOOD GLUCOSE TEST STRIPS) STRP Use 1 each in the morning, at noon, and at bedtime. 100 strip 0   hydrochlorothiazide  (MICROZIDE ) 12.5 MG capsule Take 1 capsule (12.5 mg total) by mouth daily. 90 capsule 1   insulin  glargine-yfgn (SEMGLEE ) 100 UNIT/ML Pen Inject 10 Units into the skin daily. 15 mL 1   Insulin  Pen Needle (PEN NEEDLES) 32G X 4 MM MISC Use as directed for injecting insulin  100 each 2   irbesartan -hydrochlorothiazide  (AVALIDE) 300-12.5 MG tablet Take 1 tablet by mouth daily. 90 tablet 1   Lancet Device MISC 1 each by Does not apply route in the morning, at noon, and at bedtime. May substitute to any manufacturer covered by patient's insurance. 1 each 0   lidocaine  (XYLOCAINE ) 2 % solution Use as directed 15 mLs in the mouth or throat every 4 (four) hours as needed. 270 mL 0   Naphazoline-Pheniramine (ALLERGY EYE OP) Place 1 drop into both eyes daily  as needed (allergies).     NIFEdipine  (PROCARDIA  XL/NIFEDICAL XL) 60 MG 24 hr tablet Take 1 tablet (60 mg total) by mouth daily. 90 tablet 1   norethindrone  (AYGESTIN ) 5 MG tablet Take 3 tablets (15 mg total) by mouth daily. 180 tablet 5   Oxycodone  HCl 10 MG TABS Take 1 tablet (10 mg total) by mouth every 8 (eight) hours as needed. 75 tablet 0   pantoprazole  (PROTONIX ) 40 MG tablet Take 1 tablet (40 mg total) by mouth daily. 90 tablet 3   tiZANidine  (ZANAFLEX ) 4 MG tablet Take 1 tablet (4 mg total) by mouth every 8 (eight) hours as needed for muscle spasms. 30 tablet 0   triamcinolone  cream (KENALOG ) 0.1 % Apply 1 Application topically 2 (two) times daily. 30 g 0   zolpidem  (AMBIEN ) 5 MG tablet Take 1 tablet (5 mg total) by mouth at bedtime as needed for sleep. 30 tablet 0   No current facility-administered medications for  this visit.    Allergies as of 01/13/2024 - Review Complete 01/13/2024  Allergen Reaction Noted   Codeine Hives 09/26/2011   Doxycycline  Nausea Only 12/09/2023   Losartan Other (See Comments) 12/14/2019   Penicillins Hives 09/26/2011   Tramadol  Hives 03/12/2023    Family History  Problem Relation Age of Onset   Hypertension Mother    Hyperlipidemia Mother    Diabetes Mother    Hypertension Father    Diabetes Father    Stroke Maternal Grandmother    Cancer Maternal Grandfather    Hypertension Other    Diabetes Other    Cancer Other    Cancer Maternal Aunt    Diabetes Maternal Aunt    Diabetes Maternal Uncle    Liver disease Neg Hx    Colon cancer Neg Hx    Esophageal cancer Neg Hx     Social History   Socioeconomic History   Marital status: Single    Spouse name: Not on file   Number of children: 0   Years of education: Not on file   Highest education level: Bachelor's degree (e.g., BA, AB, BS)  Occupational History   Not on file  Tobacco Use   Smoking status: Never    Passive exposure: Never   Smokeless tobacco: Never   Tobacco comments:     Never Smoked  Vaping Use   Vaping status: Never Used  Substance and Sexual Activity   Alcohol use: Never   Drug use: Never   Sexual activity: Not Currently    Birth control/protection: Abstinence, Pill  Other Topics Concern   Not on file  Social History Narrative   She is from [place], [place], moved here 1998.    Lives at home by herself.    Social Drivers of Corporate investment banker Strain: High Risk (07/13/2023)   Overall Financial Resource Strain (CARDIA)    Difficulty of Paying Living Expenses: Very hard  Food Insecurity: No Food Insecurity (01/06/2024)   Hunger Vital Sign    Worried About Running Out of Food in the Last Year: Never true    Ran Out of Food in the Last Year: Never true  Recent Concern: Food Insecurity - Food Insecurity Present (01/04/2024)   Hunger Vital Sign    Worried About Running Out of Food in the Last Year: Often true    Ran Out of Food in the Last Year: Sometimes true  Transportation Needs: No Transportation Needs (01/06/2024)   PRAPARE - Administrator, Civil Service (Medical): No    Lack of Transportation (Non-Medical): No  Physical Activity: Insufficiently Active (07/13/2023)   Exercise Vital Sign    Days of Exercise per Week: 1 day    Minutes of Exercise per Session: 10 min  Stress: Stress Concern Present (07/13/2023)   [employer] of Occupational Health - Occupational Stress Questionnaire    Feeling of Stress : To some extent  Social Connections: Moderately Isolated (07/13/2023)   Social Connection and Isolation Panel    Frequency of Communication with Friends and Family: Once a week    Frequency of Social Gatherings with Friends and Family: Never    Attends Religious Services: More than 4 times per year    Active Member of [employer] or Organizations: Yes    Attends Banker Meetings: 1 to 4 times per year    Marital Status: Never married  Intimate Partner Violence: Not At Risk (01/06/2024)   Humiliation, Afraid, Rape,  and Kick questionnaire  Fear of Current or Ex-Partner: No    Emotionally Abused: No    Physically Abused: No    Sexually Abused: No    Review of Systems:    Constitutional: No weight loss, fever, chills, weakness or fatigue HEENT: Eyes: No change in vision               Ears, Nose, Throat:  No change in hearing or congestion Skin: No rash or itching Cardiovascular: No chest pain, chest pressure or palpitations   Respiratory: No SOB or cough Gastrointestinal: See HPI and otherwise negative Genitourinary: No dysuria or change in urinary frequency Neurological: No headache, dizziness or syncope Musculoskeletal: No new muscle or joint pain Hematologic: No bleeding or bruising Psychiatric: No history of depression or anxiety    Physical Exam:  Vital signs: BP 120/68   Pulse 88   Ht 5' 5 (1.651 m)   Wt (!) 342 lb (155.1 kg)   BMI 56.91 kg/m   Constitutional: NAD, alert and cooperative Head:  Normocephalic and atraumatic. Eyes:   PEERL, EOMI. No icterus. Conjunctiva pink. Respiratory: Respirations even and unlabored. Lungs clear to auscultation bilaterally.   No wheezes, crackles, or rhonchi.  Cardiovascular:  Regular rate and rhythm. No peripheral edema, cyanosis or pallor.  Gastrointestinal:  Soft, nondistended, nontender. No rebound or guarding. Normal bowel sounds. No appreciable masses or hepatomegaly. Rectal:  Declines Msk:  Symmetrical without gross deformities. Without edema, no deformity or joint abnormality.  Neurologic:  Alert and  oriented x4;  grossly normal neurologically.  Skin:   Dry and intact without significant lesions or rashes. Psychiatric: Oriented to person, place and time. Demonstrates good judgement and reason without abnormal affect or behaviors.   RELEVANT LABS AND IMAGING: CBC    Component Value Date/Time   WBC 7.8 01/02/2024 2341   RBC 4.69 01/02/2024 2341   HGB 13.1 01/02/2024 2341   HGB 14.2 11/12/2023 1629   HCT 42.4 01/02/2024 2341    HCT 43.8 11/12/2023 1629   PLT 399 01/02/2024 2341   PLT 522 (H) 11/12/2023 1629   MCV 90.4 01/02/2024 2341   MCV 84 11/12/2023 1629   MCH 27.9 01/02/2024 2341   MCHC 30.9 01/02/2024 2341   RDW 15.0 01/02/2024 2341   RDW 14.6 11/12/2023 1629   LYMPHSABS 1.1 01/02/2024 1609   LYMPHSABS 1.6 11/12/2023 1629   MONOABS 0.8 01/02/2024 1609   EOSABS 0.0 01/02/2024 1609   EOSABS 0.2 11/12/2023 1629   BASOSABS 0.0 01/02/2024 1609   BASOSABS 0.1 11/12/2023 1629    CMP     Component Value Date/Time   NA 135 01/02/2024 2341   NA 142 11/12/2023 1629   K 4.2 01/02/2024 2341   CL 99 01/02/2024 2341   CO2 24 01/02/2024 2341   GLUCOSE 142 (H) 01/02/2024 2341   BUN 11 01/02/2024 2341   BUN 11 11/12/2023 1629   CREATININE 0.63 01/02/2024 2341   CALCIUM 9.0 01/02/2024 2341   PROT 7.3 01/02/2024 1609   PROT 7.3 11/12/2023 1629   ALBUMIN 4.1 01/02/2024 1609   ALBUMIN 4.5 11/12/2023 1629   AST 24 01/02/2024 1609   ALT 48 (H) 01/02/2024 1609   ALKPHOS 40 01/02/2024 1609   BILITOT 0.4 01/02/2024 1609   BILITOT 0.6 11/12/2023 1629   GFRNONAA >60 01/02/2024 2341   GFRAA 130 01/12/2019 1423     Assessment/Plan:   Epigastric pain Nausea Early satiety Negative H pylori stool. Negative RUQ ultrasound, CT, CBC, CMP, lipase. Normal GES. HIDA with gallbladder EF  2%. S/p cholecystectomy 01/02/24. On Mounjaro  and opioids. She feels it is too soon to tell if there is improvement. Recently discharged for pneumothorax as per surgery -- Symptoms will likely improve s/p cholecystectomy since her ejection fraction was 2% with documented biliary dyskinesia - Avoid endoscopy due to high risk of complications with BMI of 56 and recent pneumothorax.  - Let us  know if symptoms do not resolve -- follow up in October   Chronic opioid-induced constipation Chronic constipation likely exacerbated by narcotic use and recent mounjaro .  Previously failed MiraLAX , fiber, Colace. Recent TSH normal.  Diarrhea with  Linzess.  Currently improved on MiraLAX  1 capful daily s/p cholecystectomy with occasional hard stools. -- Increase to 2 capsules daily - increase water, increase fiber, exercise  GERD Persistent burning in chest after stopping pantoprazole  for surgery. - pantoprazole  40mg  once daily - Educated patient on lifestyle modifications and provided patient education handout - Weight loss likely to help with GERD as well  Diabetes mellitus Diabetes with suboptimal glycemic control, recent A1c of 8. Previously on Mounjaro , now on glipizide .  Gigi Kyle [facility] 01/13/2024, 10:27 AM  Cc: de Peru, Raymond J, MD

## 2024-01-13 NOTE — Progress Notes (Signed)
 Noted

## 2024-01-13 NOTE — Addendum Note (Signed)
 Addended by: DE Peru, Pandora Mccrackin J on: 01/13/2024 08:21 AM   Modules accepted: Orders

## 2024-01-13 NOTE — Patient Instructions (Addendum)
 _______________________________________________________  If your blood pressure at your visit was 140/90 or greater, please contact your primary care physician to follow up on this.  _______________________________________________________  If you are age 42 or older, your body mass index should be between 23-30. Your Body mass index is 56.91 kg/m. If this is out of the aforementioned range listed, please consider follow up with your Primary Care Provider.  If you are age 59 or younger, your body mass index should be between 19-25. Your Body mass index is 56.91 kg/m. If this is out of the aformentioned range listed, please consider follow up with your Primary Care Provider.   ________________________________________________________  The [facility] [place] [facility] providers would like to encourage you to use MYCHART to communicate with providers for non-urgent requests or questions.  Due to long hold times on the telephone, sending your provider a message by [facility] may be a faster and more efficient way to get a response.  Please allow 48 business hours for a response.  Please remember that this is for non-urgent requests.  _______________________________________________________   We have sent the following medications to your pharmacy for you to pick up at your convenience: Pantoprazole  40 mg  please take once a day 30 minutes before meals   Follow in october

## 2024-01-13 NOTE — Telephone Encounter (Signed)
 Copied from CRM [id number]. Topic: Clinical - Request for Lab/Test Order >> Jan 13, 2024 12:14 PM Heather  M wrote: Reason for CRM: [facility] but they cannot find the order. I was able to see imaging for the chest ordered in her chart. No answer at [facility]. Please call patient to advise if she will be able to get this completed today, she will go back.

## 2024-01-13 NOTE — Telephone Encounter (Signed)
 Sent message to xray staff they are able to see order for patient.  Patient is waiting and will have this done today.

## 2024-01-14 ENCOUNTER — Ambulatory Visit (HOSPITAL_BASED_OUTPATIENT_CLINIC_OR_DEPARTMENT_OTHER): Payer: Self-pay | Admitting: Family Medicine

## 2024-01-14 LAB — BASIC METABOLIC PANEL WITH GFR
BUN/Creatinine Ratio: 16 (ref 9–23)
BUN: 13 mg/dL (ref 6–24)
CO2: 22 mmol/L (ref 20–29)
Calcium: 10 mg/dL (ref 8.7–10.2)
Chloride: 97 mmol/L (ref 96–106)
Creatinine, Ser: 0.79 mg/dL (ref 0.57–1.00)
Glucose: 100 mg/dL — ABNORMAL HIGH (ref 70–99)
Potassium: 4.6 mmol/L (ref 3.5–5.2)
Sodium: 140 mmol/L (ref 134–144)
eGFR: 96 mL/min/{1.73_m2} (ref >59)

## 2024-01-21 ENCOUNTER — Other Ambulatory Visit: Payer: Self-pay

## 2024-01-21 ENCOUNTER — Ambulatory Visit: Attending: Podiatry | Admitting: Physical Therapy

## 2024-01-21 ENCOUNTER — Encounter: Payer: Self-pay | Admitting: Physical Medicine & Rehabilitation

## 2024-01-21 ENCOUNTER — Encounter: Payer: Self-pay | Admitting: Physical Therapy

## 2024-01-21 DIAGNOSIS — M25572 Pain in left ankle and joints of left foot: Secondary | ICD-10-CM | POA: Diagnosis present

## 2024-01-21 DIAGNOSIS — M5459 Other low back pain: Secondary | ICD-10-CM | POA: Insufficient documentation

## 2024-01-21 DIAGNOSIS — M7662 Achilles tendinitis, left leg: Secondary | ICD-10-CM | POA: Insufficient documentation

## 2024-01-21 DIAGNOSIS — M6281 Muscle weakness (generalized): Secondary | ICD-10-CM | POA: Diagnosis present

## 2024-01-21 DIAGNOSIS — R2681 Unsteadiness on feet: Secondary | ICD-10-CM | POA: Insufficient documentation

## 2024-01-21 DIAGNOSIS — M25561 Pain in right knee: Secondary | ICD-10-CM | POA: Insufficient documentation

## 2024-01-21 DIAGNOSIS — M25562 Pain in left knee: Secondary | ICD-10-CM | POA: Diagnosis present

## 2024-01-21 NOTE — Therapy (Signed)
 OUTPATIENT PHYSICAL THERAPY THORACOLUMBAR EVALUATION  Patient Name: Molly Archer MRN: [id number] DOB:January 27, 1981, 43 y.o., female Today's Date: 01/21/2024   PT End of Session - 01/21/24 1038     Visit Number 1    Number of Visits 8    Date for PT Re-Evaluation 03/17/24    Authorization Type [employer] blue    Authorization Time Period 8 visits remaining    PT Start Time 0930    PT Stop Time 1011    PT Time Calculation (min) 41 min          Past Medical History:  Diagnosis Date   Allergy    Anemia    pt states she took iron for this around 2019, and has had no issues since   Arthritis    knees   Asthma    only uses inhaler in fall and spring   Constipation    Diabetes mellitus without complication (HCC)    GERD (gastroesophageal reflux disease)    diet controlled, occ. uses omeprazole    Headache    otc med prn   Heart murmur    History of kidney stones    Hx of Bell's palsy 12/2011   Hypertension    Menometrorrhagia    Psoriasis    Shortness of breath dyspnea    with exercise/exertion   Vitamin D  deficiency    Past Surgical History:  Procedure Laterality Date   CHOLECYSTECTOMY N/A 01/01/2024   Procedure: LAPAROSCOPIC CHOLECYSTECTOMY;  Surgeon: Tanda Locus, MD;  Location: [facility];  Service: General;  Laterality: N/A;   COMBINED HYSTEROSCOPY DIAGNOSTIC / D&C N/A 07/30/2009   Benign secretory endometrium   CYSTOSCOPY WITH STENT PLACEMENT Left 10/19/2014   Procedure: CYSTOSCOPY WITH STENT PLACEMENT;  Surgeon: Morene LELON Salines, MD;  Location: [facility];  Service: Urology;  Laterality: Left;   DILATION AND CURETTAGE OF UTERUS  10/29/2003   Polyp, mild atypia, simple and complex hyperplasia,   DILATION AND CURETTAGE OF UTERUS  11/28/2003   Simple and complex hyperplasia   DILATION AND CURETTAGE OF UTERUS  07/30/2009   DILATION AND CURETTAGE OF UTERUS N/A 12/14/2014   Procedure: DILATATION AND CURETTAGE;  Surgeon: Winton Felt, MD;  Location: [facility];  Service:  Gynecology;  Laterality: N/A;   HOLMIUM LASER APPLICATION Left 10/19/2014   Procedure: HOLMIUM LASER APPLICATION;  Surgeon: Morene LELON Salines, MD;  Location: [facility];  Service: Urology;  Laterality: Left;   HYSTEROSCOPY WITH D & C N/A 07/21/2018   Procedure: DILATATION AND CURETTAGE /HYSTEROSCOPY;  Surgeon: Eveline Lynwood MATSU, MD;  Location: [facility];  Service: Gynecology;  Laterality: N/A;   INTRAUTERINE DEVICE (IUD) INSERTION N/A 12/14/2014   Procedure: INTRAUTERINE DEVICE (IUD) INSERTION;  Surgeon: Winton Felt, MD;  Location: [facility];  Service: Gynecology;  Laterality: N/A;   KNEE CARTILAGE SURGERY Left    STONE EXTRACTION WITH BASKET Left 10/19/2014   Procedure: STONE EXTRACTION WITH BASKET;  Surgeon: Morene LELON Salines, MD;  Location: [facility];  Service: Urology;  Laterality: Left;   WISDOM TOOTH EXTRACTION     Patient Active Problem List   Diagnosis Date Noted   Physical deconditioning 01/08/2024   Chronic prescription opiate use 01/03/2024   Primary spontaneous pneumothorax 01/02/2024   Constipation 09/25/2023   Early awakening 08/26/2023   Lumbar radiculopathy 07/17/2023   Adjustment disorder with anxious mood 05/16/2023   Primary osteoarthritis of right knee 05/15/2023   Right hip pain 05/15/2023   Spondylosis of lumbar spine 05/15/2023   Cervical spondylolysis 04/16/2023  Cervical sprain 04/16/2023   Haglund's deformity of left heel 04/16/2023   Intracranial hypertension 03/04/2023   Diabetes mellitus (HCC) 03/04/2023   Primary insomnia 09/19/2022   Migraine without aura and without status migrainosus, not intractable 05/02/2020   Complex tear of lateral meniscus of left knee as current injury 12/22/2019   Iron overload 12/21/2019   Derangement of left knee 09/08/2019   Dermatitis 12/18/2018   Vitamin D  deficiency 09/25/2018   Absolute anemia 09/25/2018   Class 3 severe obesity with serious comorbidity and body mass index (BMI) of 50.0 to 59.9 in adult 09/25/2018    Thickened endometrium 07/20/2018   History of endometrial hyperplasia 07/20/2018   Morbid obesity with BMI of 50.0-59.9, adult (HCC) 07/20/2018   Abnormal uterine bleeding (AUB)    Essential hypertension 02/25/2012   Hx of Bell's palsy 02/25/2012    PCP: de [place], Raymond J, MD  REFERRING PROVIDER: Joya Stabs, DPM  THERAPY DIAG:  Pain in left ankle and joints of left foot  Other low back pain  Muscle weakness (generalized)  Unsteadiness on feet  Right knee pain, unspecified chronicity  Left knee pain, unspecified chronicity  REFERRING DIAG: Achilles tendinitis, left leg [M76.62]  Rationale for Evaluation and Treatment:  Rehabilitation  SUBJECTIVE:  PERTINENT PAST HISTORY:  Hx of bell's palsy, DM, L meniscus tear, migraine        PRECAUTIONS: cholecystectomy 6/4 with subsequent spontaeous pneumothorax  WEIGHT BEARING RESTRICTIONS  No lifting more than 15#  FALLS:  Has patient fallen in last 6 months? No, Number of falls: 0  MOI/History of condition:  Onset date: multiple years  SUBJECTIVE STATEMENT  Molly Archer is a 43 y.o. female who presents to clinic with chief complaint of L achilles pain.  She has a recent history of cholecystectomy with subsequent pneumothorax in early June.  Other significant history of low back and bil knee pain which improved somewhat with round of therapy at [facility] earlier this year.  Her back was doing fairly well but she had an incident when getting into the shower after her surgery that flared her sxs and her pain can reach a 9/10.  The achilles pain has been ongoing with occasional flares for many years.  She has been having more pain over the last 3 months with this.  She is wearing a heel lift which is not very helpful.  She has tried to [employer] but this is unhelpful.  She has some insertional and mid portion pain.  She feels this prevents her from walking longer distances.  She would like to concentrate on her ankle pain  today.  From recent hospital D/C: 43 y.o. female with hx of asthma, HTN, morbid obesity, recent hx chronic cholecystitis / bilary dyskinesia s/p lap cholecystectomy by Dr. Tanda on 6/4 who is transferred from [facility] ED for pneumothorax. Reports that after her surgery she had slight amount of SOB and R sided chest discomfort. Gradual onset. This AM was much worse and had significant R chest pain and SOB with any exertion, called surgical center who referred to ED. Found to have a moderate-large R PTX without mediastinal shift and chest tube was placed by outside EDP.  After chest tube placement she was admitted to the hospitalist team, PCCM was consulted for chest tube management.   Red flags:  denies   Pain:  Are you having pain? Yes Pain location: L achilles NPRS scale:  Average: 8/10, Worst: 9/10 Aggravating factors: walking, standing, going up or down steps Relieving  factors: min relieving factors Pain description: sharp and aching  Are you having pain? 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  Assistive Device: NA  Hand Dominance: NA  Patient Goals/Specific Activities: reduce pain, walk and stand for longer   OBJECTIVE:   DIAGNOSTIC FINDINGS:  None recent  GENERAL OBSERVATION/GAIT: Slow antalgic gait with WBOS  LE MMT:  MMT Right (Eval) Left (Eval)  Hip flexion (L2, L3)    Knee extension (L3) n n  Knee flexion n n  Hip abduction    Hip extension    Hip external rotation    Hip internal rotation    Hip adduction    Ankle dorsiflexion (L4) n n  Ankle plantarflexion (S1)  Painful MMT - unable to SL heel lift  Ankle inversion n n  Ankle eversion n n  Great Toe ext (L5)    Grossly     (Blank rows = not tested, score listed is out of 5 possible points OR  may be listed in lbs of force.  N = WNL, D = diminished, C = clear for gross weakness with myotome testing, * = concordant pain with testing)  LE ROM:  ROM Right (Eval) Left (Eval)  Hip flexion    Hip extension    Hip abduction    Hip adduction    Hip internal rotation    Hip external rotation    Knee extension    Knee flexion    Ankle dorsiflexion 0 0*  Ankle plantarflexion n n  Ankle inversion n n  Ankle eversion     (Blank rows = not tested, N = WNL, * = concordant pain with testing)  Functional Tests  Eval    30'' STS: 6x  UE used? Y        Progressive balance screen (highest level completed for >/= 10''):  Feet together: 10'' Semi Tandem: R in rear 10'', L in rear 10'' Tandem: R in rear 5'', L in rear 4'' SLS: R unable, L unable     2' MWT: 308'                                              PATIENT SURVEYS:  LEFS: 17/80  TODAY'S TREATMENT   Therapeutic Exercise: Creating, reviewing, and completing below HEP  PATIENT EDUCATION ([place]/[facility]):  POC, diagnosis, prognosis, HEP, and outcome measures.  Pt educated via explanation, demonstration, and handout (HEP).  Pt confirms understanding verbally.   HOME EXERCISE PROGRAM: Access Code: [email or web address] URL: [email or web address] Date: 01/21/2024 Prepared by: Helene Gasmen  Exercises - Seated Ankle Plantar Flexion with Resistance Loop  - 1-2 x daily - 7 x weekly - 3 reps - l minute hold - Sit to Stand Without Arm Support  - 1-2 x daily - 7 x weekly - 3 sets - 5 reps - Standing Tandem Balance with Counter Support  - 1 x daily - 7 x weekly - 3 sets - 45'' hold  Treatment priorities   Eval        balance        Global endurance        Progressive achilles loading  ASSESSMENT:  CLINICAL IMPRESSION: Ilyse is a 43 y.o. female who presents to clinic with signs and sxs consistent with chronic posterior ankle pain.  Elements of mid portion and insertional  tendinopathy.  Concurrent general deconditioning, low back, and knee pain.  Knee pain not evaluated today d/t time constraints.  Clorissa will benefit form skilled therapy to address relevant deficits and improve comfort and safety with functional mobility tasks.    OBJECTIVE IMPAIRMENTS: Pain, ankle ROM, endurance, balance, gait  ACTIVITY LIMITATIONS: standing, walking, steps, work, bending, lifting  PERSONAL FACTORS: See medical history and pertinent history   REHAB POTENTIAL: Good  CLINICAL DECISION MAKING: Evolving/moderate complexity  EVALUATION COMPLEXITY: Moderate   GOALS:   SHORT TERM GOALS: Target date: 02/18/2024   Raschelle will be >75% HEP compliant to improve carryover between sessions and facilitate independent management of condition  Evaluation: ongoing Goal status: INITIAL   LONG TERM GOALS: Target date: 03/17/2024   Berlene will report confidence in maintenance of balance at time of discharge with advanced HEP  Evaluation/Baseline: unable to self manage Goal status: INITIAL   2.  Camyra will be able to stand for >30'' in tandem stance, to show a significant improvement in balance in order to reduce fall risk   Evaluation/Baseline: < 10'' bil Goal status: INITIAL   3.  Collyns will improve 30'' STS (MCID 2) to >/= 10x (w/ UE?: Y) to show improved LE strength and improved transfers   Evaluation/Baseline: 6x  w/ UE? Y Goal status: INITIAL   4.  Diyana will improve two minute walk test to 400 feet (MCID 40 ft).  Evaluation/Baseline: 308 ft Goal status: INITIAL   5.  Shaquera will self report >/= 50% decrease in L posterior heel pain from evaluation to improve function in daily tasks  Evaluation/Baseline: 9/10 max pain Goal status: INITIAL   6.  Amalya will self report >/= 50% decrease in low back pain from evaluation to improve function in daily tasks  Evaluation/Baseline: 9/10 max pain Goal status: INITIAL   PLAN: PT FREQUENCY:  1-2x/week  PT DURATION: 8 weeks  PLANNED INTERVENTIONS: Therapeutic exercises, Aquatic therapy, Therapeutic activity, Neuro Muscular re-education, Gait training, Patient/Family education, Joint mobilization, Dry Needling, Electrical stimulation, Spinal mobilization and/or manipulation, Moist heat, Taping, Vasopneumatic device, Ionotophoresis 4mg /ml Dexamethasone , and Manual therapy   Helene Gasmen PT, DPT 01/21/2024, 10:41 AM   I just finished a MCD eval/recert.  Name: AMERAH PULEO  MRN: [id number] Please request 1x/week for 8 weeks.  Check all conditions that are expected to impact treatment: Musculoskeletal disorders   I DID put a charge in.  Check all possible CPT codes: 02889- Therapeutic Exercise, [phone]- Neuro Re-education, [phone] - Gait Training, [phone] - Manual Therapy, 97530 - Therapeutic Activities, 97535 - Self Care, [phone] - Re-evaluation, [id number] - Mechanical traction, and [id number] - Aquatic therapy   Thank you!  MCD - Secure

## 2024-01-22 ENCOUNTER — Ambulatory Visit

## 2024-01-24 ENCOUNTER — Ambulatory Visit (INDEPENDENT_AMBULATORY_CARE_PROVIDER_SITE_OTHER): Admitting: Licensed Clinical Social Worker

## 2024-01-24 DIAGNOSIS — F4322 Adjustment disorder with anxiety: Secondary | ICD-10-CM | POA: Diagnosis not present

## 2024-01-24 NOTE — Progress Notes (Unsigned)
 [facility] Counselor/Therapist Progress Note  Patient ID: KENSLEI HEARTY, MRN: [id number]    Date: 01/24/24  Time Spent: 0800  am - 0848 am : 48 Minutes  Treatment Type: Individual Therapy.  Reported Symptoms: ***  Mental Status Exam: Appearance:  {PSY:22683}     Behavior: {PSY:21022743}  Motor: {PSY:22302}  Speech/Language:  {PSY:22685}  Affect: {PSY:22687}  Mood: {PSY:31886}  Thought process: {PSY:31888}  Thought content:   {[phone]}  Sensory/Perceptual disturbances:   {[phone]}  Orientation: {PSY:30297}  Attention: {PSY:22877}  Concentration: {[phone]}  Memory: {[phone]}  Fund of knowledge:  {[phone]}  Insight:   {[phone]}  Judgment:  {[phone]}  Impulse Control: {[phone]}   Risk Assessment: Danger to Self:  {PSY:22692} Self-injurious Behavior: {PSY:22692} Danger to Others: {PSY:22692} Duty to Warn:{PSY:311194} Physical Aggression / Violence:{PSY:21197} Access to Firearms a concern: {PSY:21197} Gang Involvement:{PSY:21197}  Subjective:   Molly Archer participated from {Patient Location:[address]::home}, via {LBBHVIDEOORPHONE:26720}, and consented to treatment. Therapist participated from {LBBHPROVIDERLOCATION:26721}. We met online due to COVID pandemic.   ***   Interventions: {PSY:[phone]}  Diagnosis: No diagnosis found.   Plan: ***Patient is to use CBT, mindfulness and coping skills to help manage decrease symptoms associated with their diagnosis.   Long-term goal:   ***Reduce overall level, frequency, and intensity of the feelings of depression, anxiety and panic evidenced by       decreased irritability, negative self talk, and helpless feelings from 6 to 7 days/week to 0 to 1 days/week per client report for at least 3 consecutive months.  Short-term goal:  ***Verbally express understanding of the relationship between feelings of depression, anxiety and their impact on thinking  patterns and behaviors. Verbalize an understanding of the role that distorted thinking plays in creating fears, excessive worry, and ruminations.  Damien Junk MSW, LCSW/DATE

## 2024-01-27 ENCOUNTER — Other Ambulatory Visit (HOSPITAL_BASED_OUTPATIENT_CLINIC_OR_DEPARTMENT_OTHER): Payer: Self-pay

## 2024-01-27 ENCOUNTER — Ambulatory Visit (INDEPENDENT_AMBULATORY_CARE_PROVIDER_SITE_OTHER)

## 2024-01-27 ENCOUNTER — Ambulatory Visit (INDEPENDENT_AMBULATORY_CARE_PROVIDER_SITE_OTHER): Admitting: Orthopaedic Surgery

## 2024-01-27 DIAGNOSIS — G8929 Other chronic pain: Secondary | ICD-10-CM | POA: Diagnosis not present

## 2024-01-27 DIAGNOSIS — M545 Low back pain, unspecified: Secondary | ICD-10-CM

## 2024-01-27 MED ORDER — METHOCARBAMOL 500 MG PO TABS
500.0000 mg | ORAL_TABLET | Freq: Four times a day (QID) | ORAL | 1 refills | Status: DC
  Filled 2024-01-27: qty 30, 8d supply, fill #0
  Filled 2024-02-05: qty 30, 8d supply, fill #1
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
## 2024-01-28 ENCOUNTER — Telehealth (HOSPITAL_BASED_OUTPATIENT_CLINIC_OR_DEPARTMENT_OTHER): Payer: Self-pay | Admitting: Family Medicine

## 2024-01-28 ENCOUNTER — Ambulatory Visit: Payer: No Typology Code available for payment source | Admitting: Neurology

## 2024-01-28 NOTE — Telephone Encounter (Signed)
 Recieved Via fax Made Copies Attached billing form  placed in Providers Box Advised pt to allow 7-10 business days

## 2024-02-03 ENCOUNTER — Encounter (HOSPITAL_BASED_OUTPATIENT_CLINIC_OR_DEPARTMENT_OTHER): Payer: Self-pay

## 2024-02-04 ENCOUNTER — Ambulatory Visit (INDEPENDENT_AMBULATORY_CARE_PROVIDER_SITE_OTHER): Admitting: Licensed Clinical Social Worker

## 2024-02-04 ENCOUNTER — Other Ambulatory Visit (HOSPITAL_BASED_OUTPATIENT_CLINIC_OR_DEPARTMENT_OTHER): Payer: Self-pay

## 2024-02-04 DIAGNOSIS — F4322 Adjustment disorder with anxiety: Secondary | ICD-10-CM | POA: Diagnosis not present

## 2024-02-04 NOTE — Progress Notes (Unsigned)
 [facility] Counselor/Therapist Progress Note  Patient ID: Molly Archer, MRN: [id number]    Date: 02/04/24  Time Spent: 0405  pm - 0504 pm : 56 Minutes  Treatment Type: Individual Therapy.  Reported Symptoms: anxiety related to social interaction and church relationships and work   Mental Status Exam: Appearance:  Casual     Behavior: Appropriate  Motor: Normal  Speech/Language:  Clear and Coherent  Affect: Appropriate  Mood: normal  Thought process: normal  Thought content:   WNL  Sensory/Perceptual disturbances:   WNL  Orientation: oriented to person, place, time/date, situation, day of week, month of year, and year  Attention: Good  Concentration: Good  Memory: WNL  Fund of knowledge:  Good  Insight:   Good  Judgment:  Good  Impulse Control: Good    Risk Assessment: Danger to Self:  No Self-injurious Behavior: No Danger to Others: No Duty to Warn:no Physical Aggression / Violence:No  Access to Firearms a concern: No  Gang Involvement:No    Subjective:    Molly Archer participated from home, via video, patient was aware of risk and limitations, and consented to treatment. Therapist participated from office. We met online due to patient request.   Molly Archer presented for her session reporting that she had returned to work. Molly Archer reports that she has been in training this week. Patient reports that she has a lot of emails and work to catch up on. Molly Archer stated that she is hoping to begin to work on her health and weight. Patient reports that she feels her weight is an issue to her feeling comfortable and being able to do things. Molly Archer reports that she was attending [facility] and was doing well but her insurance changed.   Clinician actively listened and provided support to patient. Clinician processed with patient her concerns about her self-esteem and her desire to begin a weight loss journey. Clinician processed with patient  using her medicaid coverage to join the [facility]. Clinician urged patient to see if the medicaid would cover the program.   Molly Archer was actively involved in session discussion. Molly Archer will continue to engage in bi weekly therapy and continue to utilize coping skills to improve symptoms. Molly Archer was pleasant and cooperative and motivated for treatment.   Interventions: Cognitive Behavioral Therapy, Motivational Interviewing and psycho education. Clinician conducted session via video from clinician's office at [facility] Reviewed events since last session. Assessed patient's mood since last session and current mood. Clinician reviewed diagnoses and treatment recommendations. Provided psycho education related to diagnoses and treatment. Treatment planning to be reviewed by 05/15/2024.    Diagnosis: Adjustment Disorder with anxious mood    Damien Junk MSW, LCSW/DATE 02/04/2024

## 2024-02-05 ENCOUNTER — Ambulatory Visit (HOSPITAL_BASED_OUTPATIENT_CLINIC_OR_DEPARTMENT_OTHER): Admitting: Orthopaedic Surgery

## 2024-02-05 ENCOUNTER — Ambulatory Visit: Attending: Podiatry | Admitting: Physical Therapy

## 2024-02-05 ENCOUNTER — Encounter: Admitting: Physical Medicine & Rehabilitation

## 2024-02-05 ENCOUNTER — Encounter (HOSPITAL_BASED_OUTPATIENT_CLINIC_OR_DEPARTMENT_OTHER): Payer: Self-pay | Admitting: Family Medicine

## 2024-02-05 ENCOUNTER — Encounter: Payer: Self-pay | Admitting: Physical Therapy

## 2024-02-05 ENCOUNTER — Other Ambulatory Visit: Payer: Self-pay | Admitting: Physical Medicine & Rehabilitation

## 2024-02-05 ENCOUNTER — Other Ambulatory Visit: Payer: Self-pay

## 2024-02-05 ENCOUNTER — Other Ambulatory Visit (HOSPITAL_BASED_OUTPATIENT_CLINIC_OR_DEPARTMENT_OTHER): Payer: Self-pay

## 2024-02-05 DIAGNOSIS — R2681 Unsteadiness on feet: Secondary | ICD-10-CM | POA: Diagnosis present

## 2024-02-05 DIAGNOSIS — M5459 Other low back pain: Secondary | ICD-10-CM | POA: Diagnosis present

## 2024-02-05 DIAGNOSIS — M6281 Muscle weakness (generalized): Secondary | ICD-10-CM | POA: Insufficient documentation

## 2024-02-05 DIAGNOSIS — M1711 Unilateral primary osteoarthritis, right knee: Secondary | ICD-10-CM

## 2024-02-05 DIAGNOSIS — M47816 Spondylosis without myelopathy or radiculopathy, lumbar region: Secondary | ICD-10-CM

## 2024-02-05 DIAGNOSIS — M25572 Pain in left ankle and joints of left foot: Secondary | ICD-10-CM | POA: Insufficient documentation

## 2024-02-05 MED ORDER — OXYCODONE HCL 10 MG PO TABS
10.0000 mg | ORAL_TABLET | Freq: Three times a day (TID) | ORAL | 0 refills | Status: DC | PRN
  Filled 2024-02-05: qty 75, 25d supply, fill #0

## 2024-02-05 MED ORDER — METOCLOPRAMIDE HCL 10 MG PO TABS
10.0000 mg | ORAL_TABLET | Freq: Three times a day (TID) | ORAL | 1 refills | Status: DC | PRN
  Filled 2024-02-05: qty 30, 10d supply, fill #0
  Filled 2024-03-17: qty 30, 10d supply, fill #1

## 2024-02-05 MED ORDER — DICYCLOMINE HCL 20 MG PO TABS
20.0000 mg | ORAL_TABLET | Freq: Three times a day (TID) | ORAL | 1 refills | Status: DC | PRN
  Filled 2024-02-05: qty 30, 10d supply, fill #0
  Filled 2024-03-17: qty 30, 10d supply, fill #1

## 2024-02-05 NOTE — Therapy (Signed)
 OUTPATIENT PHYSICAL THERAPY DAILY NOTE  Patient Name: Molly Archer MRN: [id number] DOB:24-May-1981, 43 y.o., female Today's Date: 02/05/2024   PT End of Session - 02/05/24 1016     Visit Number 2    Number of Visits 6    Date for PT Re-Evaluation 03/17/24    Authorization Type [employer] blue    Authorization Time Period Approved 6 PT visits from 01/21/24-03/20/24    Authorization - Visit Number 2    Authorization - Number of Visits 6    PT Start Time 1015    PT Stop Time 1056    PT Time Calculation (min) 41 min          Past Medical History:  Diagnosis Date   Allergy    Anemia    pt states she took iron for this around 2019, and has had no issues since   Arthritis    knees   Asthma    only uses inhaler in fall and spring   Constipation    Diabetes mellitus without complication (HCC)    GERD (gastroesophageal reflux disease)    diet controlled, occ. uses omeprazole    Headache    otc med prn   Heart murmur    History of kidney stones    Hx of Bell's palsy 12/2011   Hypertension    Menometrorrhagia    Psoriasis    Shortness of breath dyspnea    with exercise/exertion   Vitamin D  deficiency    Past Surgical History:  Procedure Laterality Date   CHOLECYSTECTOMY N/A 01/01/2024   Procedure: LAPAROSCOPIC CHOLECYSTECTOMY;  Surgeon: Tanda Locus, MD;  Location: [facility] OR;  Service: General;  Laterality: N/A;   COMBINED HYSTEROSCOPY DIAGNOSTIC / D&C N/A 07/30/2009   Benign secretory endometrium   CYSTOSCOPY WITH STENT PLACEMENT Left 10/19/2014   Procedure: CYSTOSCOPY WITH STENT PLACEMENT;  Surgeon: Morene LELON Salines, MD;  Location: [facility] OR[facility];  Service: Urology;  Laterality: Left;   DILATION AND CURETTAGE OF UTERUS  10/29/2003   Polyp, mild atypia, simple and complex hyperplasia,   DILATION AND CURETTAGE OF UTERUS  11/28/2003   Simple and complex hyperplasia   DILATION AND CURETTAGE OF UTERUS  07/30/2009   DILATION AND CURETTAGE OF UTERUS N/A 12/14/2014   Procedure:  DILATATION AND CURETTAGE;  Surgeon: Winton Felt, MD;  Location: [facility];  Service: Gynecology;  Laterality: N/A;   HOLMIUM LASER APPLICATION Left 10/19/2014   Procedure: HOLMIUM LASER APPLICATION;  Surgeon: Morene LELON Salines, MD;  Location: [facility];  Service: Urology;  Laterality: Left;   HYSTEROSCOPY WITH D & C N/A 07/21/2018   Procedure: DILATATION AND CURETTAGE /HYSTEROSCOPY;  Surgeon: Eveline Lynwood MATSU, MD;  Location: [facility];  Service: Gynecology;  Laterality: N/A;   INTRAUTERINE DEVICE (IUD) INSERTION N/A 12/14/2014   Procedure: INTRAUTERINE DEVICE (IUD) INSERTION;  Surgeon: Winton Felt, MD;  Location: [facility];  Service: Gynecology;  Laterality: N/A;   KNEE CARTILAGE SURGERY Left    STONE EXTRACTION WITH BASKET Left 10/19/2014   Procedure: STONE EXTRACTION WITH BASKET;  Surgeon: Morene LELON Salines, MD;  Location: [facility];  Service: Urology;  Laterality: Left;   WISDOM TOOTH EXTRACTION     Patient Active Problem List   Diagnosis Date Noted   Physical deconditioning 01/08/2024   Chronic prescription opiate use 01/03/2024   Primary spontaneous pneumothorax 01/02/2024   Constipation 09/25/2023   Early awakening 08/26/2023   Lumbar radiculopathy 07/17/2023   Adjustment disorder with anxious mood 05/16/2023   Primary osteoarthritis of right knee 05/15/2023  Right hip pain 05/15/2023   Spondylosis of lumbar spine 05/15/2023   Cervical spondylolysis 04/16/2023   Cervical sprain 04/16/2023   Haglund's deformity of left heel 04/16/2023   Intracranial hypertension 03/04/2023   Diabetes mellitus (HCC) 03/04/2023   Primary insomnia 09/19/2022   Migraine without aura and without status migrainosus, not intractable 05/02/2020   Complex tear of lateral meniscus of left knee as current injury 12/22/2019   Iron overload 12/21/2019   Derangement of left knee 09/08/2019   Dermatitis 12/18/2018   Vitamin D  deficiency 09/25/2018   Absolute anemia 09/25/2018   Class 3 severe obesity with serious  comorbidity and body mass index (BMI) of 50.0 to 59.9 in adult 09/25/2018   Thickened endometrium 07/20/2018   History of endometrial hyperplasia 07/20/2018   Morbid obesity with BMI of 50.0-59.9, adult (HCC) 07/20/2018   Abnormal uterine bleeding (AUB)    Essential hypertension 02/25/2012   Hx of Bell's palsy 02/25/2012    PCP: de Peru, Raymond J, MD  REFERRING PROVIDER: de [place], Raymond J, MD  THERAPY DIAG:  Pain in left ankle and joints of left foot  Other low back pain  Muscle weakness (generalized)  Unsteadiness on feet  REFERRING DIAG: Achilles tendinitis, left leg [M76.62]  Rationale for Evaluation and Treatment:  Rehabilitation  SUBJECTIVE:  PERTINENT PAST HISTORY:  Hx of bell's palsy, DM, L meniscus tear, migraine        PRECAUTIONS: cholecystectomy 6/4 with subsequent spontaeous pneumothorax  WEIGHT BEARING RESTRICTIONS  No lifting more than 15#  FALLS:  Has patient fallen in last 6 months? No, Number of falls: 0  MOI/History of condition:  Onset date: multiple years  SUBJECTIVE STATEMENT   02/05/2024: Pt reports no significant improvement in her achilles pain.  Molly Archer is a 43 y.o. female who presents to clinic with chief complaint of L achilles pain.  She has a recent history of cholecystectomy with subsequent pneumothorax in early June.  Other significant history of low back and bil knee pain which improved somewhat with round of therapy at [facility] earlier this year.  Her back was doing fairly well but she had an incident when getting into the shower after her surgery that flared her sxs and her pain can reach a 9/10.  The achilles pain has been ongoing with occasional flares for many years.  She has been having more pain over the last 3 months with this.  She is wearing a heel lift which is not very helpful.  She has tried to [employer] but this is unhelpful.  She has some insertional and mid portion pain.  She feels this prevents her from walking  longer distances.  She would like to concentrate on her ankle pain today.  From recent hospital D/C: 43 y.o. female with hx of asthma, HTN, morbid obesity, recent hx chronic cholecystitis / bilary dyskinesia s/p lap cholecystectomy by Dr. Tanda on 6/4 who is transferred from [facility] ED for pneumothorax. Reports that after her surgery she had slight amount of SOB and R sided chest discomfort. Gradual onset. This AM was much worse and had significant R chest pain and SOB with any exertion, called surgical center who referred to ED. Found to have a moderate-large R PTX without mediastinal shift and chest tube was placed by outside EDP.  After chest tube placement she was admitted to the hospitalist team, PCCM was consulted for chest tube management.   Red flags:  denies   Pain:  Are you having pain? Yes Pain  location: L achilles NPRS scale:  Average: 8/10, Worst: 9/10 Aggravating factors: walking, standing, going up or down steps Relieving factors: min relieving factors Pain description: sharp and aching  Are you having pain? 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  Assistive Device: NA  Hand Dominance: NA  Patient Goals/Specific Activities: reduce pain, walk and stand for longer   OBJECTIVE:   DIAGNOSTIC FINDINGS:  None recent  GENERAL OBSERVATION/GAIT: Slow antalgic gait with WBOS  LE MMT:  MMT Right (Eval) Left (Eval)  Hip flexion (L2, L3)    Knee extension (L3) n n  Knee flexion n n  Hip abduction    Hip extension    Hip external rotation    Hip internal rotation    Hip adduction    Ankle dorsiflexion (L4) n n  Ankle plantarflexion (S1)  Painful MMT - unable to SL heel lift  Ankle inversion n n  Ankle eversion n n  Great Toe ext (L5)    Grossly      (Blank rows = not tested, score listed is out of 5 possible points OR may be listed in lbs of force.  N = WNL, D = diminished, C = clear for gross weakness with myotome testing, * = concordant pain with testing)  LE ROM:  ROM Right (Eval) Left (Eval)  Hip flexion    Hip extension    Hip abduction    Hip adduction    Hip internal rotation    Hip external rotation    Knee extension    Knee flexion    Ankle dorsiflexion 0 0*  Ankle plantarflexion n n  Ankle inversion n n  Ankle eversion     (Blank rows = not tested, N = WNL, * = concordant pain with testing)  Functional Tests  Eval    30'' STS: 6x  UE used? Y        Progressive balance screen (highest level completed for >/= 10''):  Feet together: 10'' Semi Tandem: R in rear 10'', L in rear 10'' Tandem: R in rear 5'', L in rear 4'' SLS: R unable, L unable     2' MWT: 308'                                              PATIENT SURVEYS:  LEFS: 17/80  TODAY'S TREATMENT   [facility] Adult PT Treatment  02/05/2024:  Therapeutic Exercise: BAPS Board L4 - 20x PF, inv/ev w/ 3 weights for PF Bil heel raise isometrics - 4x45'' nu-step L5 51m while taking subjective and planning session with patient Standing row - Blue TB - 2x10 Standing shoulder ext with abdominal contraction - 2x10 LTR Alternating Clam GTB - 2x10  Neuromuscular re-ed: FT on foam with OH and lateral reaches    HOME EXERCISE PROGRAM: Access Code: [email or web address] URL: [email or web address] Date: 02/05/2024 Prepared by: Helene Gasmen  Exercises - Sit to Stand Without Arm Support  - 1-2 x daily - 7 x weekly - 3 sets - 5 reps - Standing Tandem Balance with Counter Support  - 1 x daily - 7 x weekly - 3 sets - 45'' hold - Heel Raises with Counter Support  - 1-2 x daily -  7 x weekly - 1 sets - 5 reps - 45 sec hold - Hooklying Isometric Clamshell  - 1 x daily - 7 x weekly - 3 sets - 10 reps - Supine Lower Trunk Rotation  - 1 x daily - 7 x  weekly - 1 sets - 20 reps - 3 hold  Treatment priorities   Eval        balance        Global endurance        Progressive achilles loading                          ASSESSMENT:  CLINICAL IMPRESSION:  02/05/2024:  Sharlisa tolerated session well with no adverse reaction.  Progressed to standing achilles loading to good effect.  Started working on balances on unstable surfaces with reaching with good tolerance with FT.  Started some basic low back exercises.  HEP updated.  EVAL: Cadince is a 43 y.o. female who presents to clinic with signs and sxs consistent with chronic posterior ankle pain.  Elements of mid portion and insertional tendinopathy.  Concurrent general deconditioning, low back, and knee pain.  Knee pain not evaluated today d/t time constraints.  Makayela will benefit form skilled therapy to address relevant deficits and improve comfort and safety with functional mobility tasks.    OBJECTIVE IMPAIRMENTS: Pain, ankle ROM, endurance, balance, gait  ACTIVITY LIMITATIONS: standing, walking, steps, work, bending, lifting  PERSONAL FACTORS: See medical history and pertinent history   REHAB POTENTIAL: Good  CLINICAL DECISION MAKING: Evolving/moderate complexity  EVALUATION COMPLEXITY: Moderate   GOALS:   SHORT TERM GOALS: Target date: 02/18/2024   Lorelee will be >75% HEP compliant to improve carryover between sessions and facilitate independent management of condition  Evaluation: ongoing Goal status: INITIAL   LONG TERM GOALS: Target date: 03/17/2024   Carley will report confidence in maintenance of balance at time of discharge with advanced HEP  Evaluation/Baseline: unable to self manage Goal status: INITIAL   2.  Jaretzi will be able to stand for >30'' in tandem stance, to show a significant improvement in balance in order to reduce fall risk   Evaluation/Baseline: < 10'' bil Goal status: INITIAL   3.  Chenelle will improve 30'' STS (MCID 2) to >/= 10x  (w/ UE?: Y) to show improved LE strength and improved transfers   Evaluation/Baseline: 6x  w/ UE? Y Goal status: INITIAL   4.  Yasha will improve two minute walk test to 400 feet (MCID 40 ft).  Evaluation/Baseline: 308 ft Goal status: INITIAL   5.  Meekah will self report >/= 50% decrease in L posterior heel pain from evaluation to improve function in daily tasks  Evaluation/Baseline: 9/10 max pain Goal status: INITIAL   6.  Coral will self report >/= 50% decrease in low back pain from evaluation to improve function in daily tasks  Evaluation/Baseline: 9/10 max pain Goal status: INITIAL   PLAN: PT FREQUENCY: 1-2x/week  PT DURATION: 8 weeks  PLANNED INTERVENTIONS: Therapeutic exercises, Aquatic therapy, Therapeutic activity, Neuro Muscular re-education, Gait training, Patient/Family education, Joint mobilization, Dry Needling, Electrical stimulation, Spinal mobilization and/or manipulation, Moist heat, Taping, Vasopneumatic device, Ionotophoresis 4mg /ml Dexamethasone , and Manual therapy   Helene Gasmen PT, DPT 02/05/2024, 10:59 AM   I just finished a MCD eval/recert.  Name: STEFANIE HODGENS  MRN: [id number] Please request 1x/week for 8 weeks.  Check all conditions that are expected to impact treatment: Musculoskeletal disorders   I  DID put a charge in.  Check all possible CPT codes: 02889- Therapeutic Exercise, [phone]- Neuro Re-education, [phone] - Gait Training, [phone] - Manual Therapy, 97530 - Therapeutic Activities, 97535 - Self Care, [phone] - Re-evaluation, [id number] - Mechanical traction, and [id number] - Aquatic therapy   Thank you!  MCD - Secure

## 2024-02-06 ENCOUNTER — Ambulatory Visit (INDEPENDENT_AMBULATORY_CARE_PROVIDER_SITE_OTHER): Admitting: Family Medicine

## 2024-02-06 ENCOUNTER — Encounter (HOSPITAL_BASED_OUTPATIENT_CLINIC_OR_DEPARTMENT_OTHER): Payer: Self-pay | Admitting: Family Medicine

## 2024-02-06 ENCOUNTER — Other Ambulatory Visit (HOSPITAL_BASED_OUTPATIENT_CLINIC_OR_DEPARTMENT_OTHER): Payer: Self-pay

## 2024-02-06 VITALS — BP 136/74 | HR 89 | Ht 65.0 in | Wt 336.9 lb

## 2024-02-06 DIAGNOSIS — E1165 Type 2 diabetes mellitus with hyperglycemia: Secondary | ICD-10-CM | POA: Diagnosis not present

## 2024-02-06 DIAGNOSIS — Z6841 Body Mass Index (BMI) 40.0 and over, adult: Secondary | ICD-10-CM

## 2024-02-06 DIAGNOSIS — E66813 Obesity, class 3: Secondary | ICD-10-CM | POA: Diagnosis not present

## 2024-02-06 DIAGNOSIS — Z794 Long term (current) use of insulin: Secondary | ICD-10-CM

## 2024-02-06 MED ORDER — INSULIN GLARGINE-YFGN 100 UNIT/ML ~~LOC~~ SOPN
12.0000 [IU] | PEN_INJECTOR | Freq: Every day | SUBCUTANEOUS | 1 refills | Status: DC
Start: 2024-02-06 — End: 2024-06-02
  Filled 2024-02-06 – 2024-02-12 (×2): qty 12, 100d supply, fill #0
  Filled 2024-02-28 – 2024-03-07 (×2): qty 15, 125d supply, fill #0
  Filled 2024-03-08: qty 9, 75d supply, fill #0
  Filled 2024-05-19: qty 3, 25d supply, fill #1

## 2024-02-06 NOTE — Progress Notes (Signed)
    Procedures performed today:    None.  Independent interpretation of notes and tests performed by another provider:   None.  Brief History, Exam, Impression, and Recommendations:    BP 136/74 (BP Location: Right Arm, Patient Position: Sitting, Cuff Size: Large)   Pulse 89   Ht 5' 5 (1.651 m)   Wt (!) 336 lb 14.4 oz (152.8 kg)   SpO2 96%   BMI 56.06 kg/m   Type 2 diabetes mellitus with hyperglycemia, with long-term current use of insulin  [facility]) Assessment & Plan: Patient was able to start with long-acting insulin .  She was doing well with insulin .  She was checking fasting blood sugars each morning and these were generally laying around 130.  She did have some recent readings which were in the 160s or so, but knows that she was having some dietary indiscretions later in the evening which likely contributed to this.  Unfortunately, she is not aware that she needed to keep insulin  refrigerated and as a result, the supply that she had on hand is no longer able to be used.  She did try to discuss further with pharmacy, however she indicates that they were not able to obtain insurance authorization for her to get more insulin .  She has questions on how to move forward at this time. She has previously tried to use GLP-1 receptor agonist, however did not tolerate due to GI side effects.  She has also tried metformin, also had to discontinue due to GI side effects.  She was on glipizide  prior to transitioning to insulin , was not having adequate control blood sugars with glipizide  alone. Discussed considerations, I can look to send new prescription for insulin  to the pharmacy.  We can consider increasing dose to 12 units daily in order to optimize blood sugar control.[name]  Ultimately, if not able to obtain authorization through insurance for additional insulin , could consider taking glipizide  in the meantime to provide some control of blood sugars until able to obtain next dispensation of insulin  We  will plan to follow-up in about 6 to 8 weeks to assess progress or sooner as needed   Class 3 severe obesity with serious comorbidity and body mass index (BMI) of 50.0 to 59.9 in adult, unspecified obesity type -     Amb Referral to Bariatric Surgery  Other orders -     Insulin  Glargine-yfgn; Inject 12 Units into the skin daily.  Dispense: 15 mL; Refill: 1  She did have some questions about consideration for bariatric surgery.  We discussed general considerations related to this particular intervention.  After discussion, she elected to have referral placed so that she may be able to meet with bariatric specialist to further discuss potential intervention which I feel is reasonable, referral placed today  Return in about 6 weeks (around 03/19/2024) for diabetes, med check.   ___________________________________________ Linsey Hirota de [place], MD, ABFM, CAQSM Primary Care and Sports Medicine [facility]

## 2024-02-06 NOTE — Assessment & Plan Note (Signed)
 Patient was able to start with long-acting insulin .  She was doing well with insulin .  She was checking fasting blood sugars each morning and these were generally laying around 130.  She did have some recent readings which were in the 160s or so, but knows that she was having some dietary indiscretions later in the evening which likely contributed to this.  Unfortunately, she is not aware that she needed to keep insulin  refrigerated and as a result, the supply that she had on hand is no longer able to be used.  She did try to discuss further with pharmacy, however she indicates that they were not able to obtain insurance authorization for her to get more insulin .  She has questions on how to move forward at this time. She has previously tried to use GLP-1 receptor agonist, however did not tolerate due to GI side effects.  She has also tried metformin, also had to discontinue due to GI side effects.  She was on glipizide  prior to transitioning to insulin , was not having adequate control blood sugars with glipizide  alone. Discussed considerations, I can look to send new prescription for insulin  to the pharmacy.  We can consider increasing dose to 12 units daily in order to optimize blood sugar control.[name]  Ultimately, if not able to obtain authorization through insurance for additional insulin , could consider taking glipizide  in the meantime to provide some control of blood sugars until able to obtain next dispensation of insulin  We will plan to follow-up in about 6 to 8 weeks to assess progress or sooner as needed

## 2024-02-06 NOTE — Patient Instructions (Signed)
  Medication Instructions:  Your physician recommends that you continue on your current medications as directed. Please refer to the Current Medication list given to you today. --If you need a refill on any your medications before your next appointment, please call your pharmacy first. If no refills are authorized on file call the office.--   Follow-Up: Your next appointment:   Your physician recommends that you schedule a follow-up appointment in: 6-8 week follow up  with Dr. de Peru  You will receive a text message or e-mail with a link to a survey about your care and experience with [place] today! We would greatly appreciate your feedback!   Thanks for letting us be apart of your health journey!!  Primary Care and Sports Medicine   Dr. Ceasar Mons Peru   We encourage you to activate your patient portal called "MyChart".  Sign up information is provided on this After Visit Summary.  MyChart is used to connect with patients for Virtual Visits (Telemedicine).  Patients are able to view lab/test results, encounter notes, upcoming appointments, etc.  Non-urgent messages can be sent to your provider as well. To learn more about what you can do with MyChart, please visit --  [email or web address].

## 2024-02-08 ENCOUNTER — Encounter (HOSPITAL_BASED_OUTPATIENT_CLINIC_OR_DEPARTMENT_OTHER): Payer: Self-pay | Admitting: Orthopaedic Surgery

## 2024-02-10 ENCOUNTER — Other Ambulatory Visit (HOSPITAL_BASED_OUTPATIENT_CLINIC_OR_DEPARTMENT_OTHER): Payer: Self-pay | Admitting: Orthopaedic Surgery

## 2024-02-10 DIAGNOSIS — G8929 Other chronic pain: Secondary | ICD-10-CM

## 2024-02-12 ENCOUNTER — Other Ambulatory Visit (HOSPITAL_BASED_OUTPATIENT_CLINIC_OR_DEPARTMENT_OTHER): Payer: Self-pay

## 2024-02-12 ENCOUNTER — Ambulatory Visit: Admitting: Physical Therapy

## 2024-02-12 DIAGNOSIS — M25572 Pain in left ankle and joints of left foot: Secondary | ICD-10-CM | POA: Diagnosis not present

## 2024-02-12 DIAGNOSIS — M6281 Muscle weakness (generalized): Secondary | ICD-10-CM

## 2024-02-12 DIAGNOSIS — M5459 Other low back pain: Secondary | ICD-10-CM

## 2024-02-12 NOTE — Therapy (Signed)
 OUTPATIENT PHYSICAL THERAPY DAILY NOTE  Patient Name: Molly Archer MRN: [id number] DOB:[birth year], 43 y.o., female Today's Date: 02/13/2024   PT End of Session - 02/13/24 0713     Visit Number 3    Number of Visits 6    Date for PT Re-Evaluation 03/17/24    Authorization Type [employer]    Authorization Time Period Approved 6 PT visits from 01/21/24-03/20/24    Authorization - Visit Number 3    Authorization - Number of Visits 6    PT Start Time 1630    PT Stop Time 1711    PT Time Calculation (min) 41 min           Past Medical History:  Diagnosis Date   Allergy    Anemia    pt states she took iron for this around 2019, and has had no issues since   Arthritis    knees   Asthma    only uses inhaler in fall and spring   Constipation    Diabetes mellitus without complication (HCC)    GERD (gastroesophageal reflux disease)    diet controlled, occ. uses omeprazole    Headache    otc med prn   Heart murmur    History of kidney stones    Hx of Bell's palsy 12/2011   Hypertension    Menometrorrhagia    Psoriasis    Shortness of breath dyspnea    with exercise/exertion   Vitamin D  deficiency    Past Surgical History:  Procedure Laterality Date   CHOLECYSTECTOMY N/A 01/01/2024   Procedure: LAPAROSCOPIC CHOLECYSTECTOMY;  Surgeon: Tanda Locus, MD;  Location: [facility] OR;  Service: General;  Laterality: N/A;   COMBINED HYSTEROSCOPY DIAGNOSTIC / D&C N/A 07/30/2009   Benign secretory endometrium   CYSTOSCOPY WITH STENT PLACEMENT Left 10/19/2014   Procedure: CYSTOSCOPY WITH STENT PLACEMENT;  Surgeon: Morene LELON Salines, MD;  Location: [facility];  Service: Urology;  Laterality: Left;   DILATION AND CURETTAGE OF UTERUS  10/29/2003   Polyp, mild atypia, simple and complex hyperplasia,   DILATION AND CURETTAGE OF UTERUS  11/28/2003   Simple and complex hyperplasia   DILATION AND CURETTAGE OF UTERUS  07/30/2009   DILATION AND CURETTAGE OF UTERUS N/A 12/14/2014   Procedure:  DILATATION AND CURETTAGE;  Surgeon: Winton Felt, MD;  Location: [facility];  Service: Gynecology;  Laterality: N/A;   HOLMIUM LASER APPLICATION Left 10/19/2014   Procedure: HOLMIUM LASER APPLICATION;  Surgeon: Morene LELON Salines, MD;  Location: [facility];  Service: Urology;  Laterality: Left;   HYSTEROSCOPY WITH D & C N/A 07/21/2018   Procedure: DILATATION AND CURETTAGE /HYSTEROSCOPY;  Surgeon: Eveline Lynwood MATSU, MD;  Location: [facility];  Service: Gynecology;  Laterality: N/A;   INTRAUTERINE DEVICE (IUD) INSERTION N/A 12/14/2014   Procedure: INTRAUTERINE DEVICE (IUD) INSERTION;  Surgeon: Winton Felt, MD;  Location: [facility];  Service: Gynecology;  Laterality: N/A;   KNEE CARTILAGE SURGERY Left    STONE EXTRACTION WITH BASKET Left 10/19/2014   Procedure: STONE EXTRACTION WITH BASKET;  Surgeon: Morene LELON Salines, MD;  Location: [facility];  Service: Urology;  Laterality: Left;   WISDOM TOOTH EXTRACTION     Patient Active Problem List   Diagnosis Date Noted   Physical deconditioning 01/08/2024   Chronic prescription opiate use 01/03/2024   Primary spontaneous pneumothorax 01/02/2024   Constipation 09/25/2023   Early awakening 08/26/2023   Lumbar radiculopathy 07/17/2023   Adjustment disorder with anxious mood 05/16/2023   Primary osteoarthritis of right knee  05/15/2023   Right hip pain 05/15/2023   Spondylosis of lumbar spine 05/15/2023   Cervical spondylolysis 04/16/2023   Cervical sprain 04/16/2023   Haglund's deformity of left heel 04/16/2023   Intracranial hypertension 03/04/2023   Diabetes mellitus (HCC) 03/04/2023   Primary insomnia 09/19/2022   Migraine without aura and without status migrainosus, not intractable 05/02/2020   Complex tear of lateral meniscus of left knee as current injury 12/22/2019   Iron overload 12/21/2019   Derangement of left knee 09/08/2019   Dermatitis 12/18/2018   Vitamin D  deficiency 09/25/2018   Absolute anemia 09/25/2018   Class 3 severe obesity with serious  comorbidity and body mass index (BMI) of 50.0 to 59.9 in adult 09/25/2018   Thickened endometrium 07/20/2018   History of endometrial hyperplasia 07/20/2018   Morbid obesity with BMI of 50.0-59.9, adult (HCC) 07/20/2018   Abnormal uterine bleeding (AUB)    Essential hypertension 02/25/2012   Hx of Bell's palsy 02/25/2012    PCP: de [place], Raymond J, MD  REFERRING PROVIDER: de [place], Raymond J, MD  THERAPY DIAG:  Pain in left ankle and joints of left foot  Other low back pain  Muscle weakness (generalized)  REFERRING DIAG: Achilles tendinitis, left leg [M76.62]  Rationale for Evaluation and Treatment:  Rehabilitation  SUBJECTIVE:  PERTINENT PAST HISTORY:  Hx of bell's palsy, DM, L meniscus tear, migraine        PRECAUTIONS: cholecystectomy 6/4 with subsequent spontaeous pneumothorax  WEIGHT BEARING RESTRICTIONS  No lifting more than 15#  FALLS:  Has patient fallen in last 6 months? No, Number of falls: 0  MOI/History of condition:  Onset date: multiple years  SUBJECTIVE STATEMENT   02/13/2024: Pt reports continued heel pain with no significant benefit.  She has been completing her HEP ~3x/week.  Molly Archer is a 43 y.o. female who presents to clinic with chief complaint of L achilles pain.  She has a recent history of cholecystectomy with subsequent pneumothorax in early June.  Other significant history of low back and bil knee pain which improved somewhat with round of therapy at [facility] earlier this year.  Her back was doing fairly well but she had an incident when getting into the shower after her surgery that flared her sxs and her pain can reach a 9/10.  The achilles pain has been ongoing with occasional flares for many years.  She has been having more pain over the last 3 months with this.  She is wearing a heel lift which is not very helpful.  She has tried to [employer] but this is unhelpful.  She has some insertional and mid portion pain.  She feels this  prevents her from walking longer distances.  She would like to concentrate on her ankle pain today.  From recent hospital D/C: 43 y.o. female with hx of asthma, HTN, morbid obesity, recent hx chronic cholecystitis / bilary dyskinesia s/p lap cholecystectomy by Dr. Tanda on 6/4 who is transferred from [facility] ED for pneumothorax. Reports that after her surgery she had slight amount of SOB and R sided chest discomfort. Gradual onset. This AM was much worse and had significant R chest pain and SOB with any exertion, called surgical center who referred to ED. Found to have a moderate-large R PTX without mediastinal shift and chest tube was placed by outside EDP.  After chest tube placement she was admitted to the hospitalist team, PCCM was consulted for chest tube management.   Red flags:  denies   Pain:  Are you having pain? Yes Pain location: L achilles NPRS scale:  Average: 8/10, Worst: 9/10 Aggravating factors: walking, standing, going up or down steps Relieving factors: min relieving factors Pain description: sharp and aching  Are you having pain? 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  Assistive Device: NA  Hand Dominance: NA  Patient Goals/Specific Activities: reduce pain, walk and stand for longer   OBJECTIVE:   DIAGNOSTIC FINDINGS:  None recent  GENERAL OBSERVATION/GAIT: Slow antalgic gait with WBOS  LE MMT:  MMT Right (Eval) Left (Eval)  Hip flexion (L2, L3)    Knee extension (L3) n n  Knee flexion n n  Hip abduction    Hip extension    Hip external rotation    Hip internal rotation    Hip adduction    Ankle dorsiflexion (L4) n n  Ankle plantarflexion (S1)  Painful MMT - unable to SL heel lift  Ankle inversion n n  Ankle eversion n n  Great Toe ext  (L5)    Grossly     (Blank rows = not tested, score listed is out of 5 possible points OR may be listed in lbs of force.  N = WNL, D = diminished, C = clear for gross weakness with myotome testing, * = concordant pain with testing)  LE ROM:  ROM Right (Eval) Left (Eval)  Hip flexion    Hip extension    Hip abduction    Hip adduction    Hip internal rotation    Hip external rotation    Knee extension    Knee flexion    Ankle dorsiflexion 0 0*  Ankle plantarflexion n n  Ankle inversion n n  Ankle eversion     (Blank rows = not tested, N = WNL, * = concordant pain with testing)  Functional Tests  Eval    30'' STS: 6x  UE used? Y        Progressive balance screen (highest level completed for >/= 10''):  Feet together: 10'' Semi Tandem: R in rear 10'', L in rear 10'' Tandem: R in rear 5'', L in rear 4'' SLS: R unable, L unable     2' MWT: 308'                                              PATIENT SURVEYS:  LEFS: 17/80  TODAY'S TREATMENT   [facility] Adult PT Treatment  02/12/2024:  Therapeutic Exercise: Bil heel raise isometrics - 5x45'' nu-step L5 59m while taking subjective and planning session with patient LTR Clam shell blue TB - 3x10 SLR - 2x10 ea Hip adduction squeeze - 5'' hold 2x10  Therapeutic Activity  STS from raised table - 4x5 Standing hip abd - 2x10 ea Education regarding importance of completing achilles loading at least 1x/day      HOME EXERCISE PROGRAM: Access Code: [email or web address] URL: [email or web address] Date: 02/05/2024 Prepared by: Molly Archer  Exercises - Sit to Stand Without Arm Support  - 1-2 x daily - 7 x weekly - 3 sets - 5 reps - Standing Tandem Balance with Counter Support  - 1 x daily - 7 x weekly - 3 sets - 45'' hold - Heel Raises with  Counter Support  - 1-2 x daily - 7 x weekly - 1 sets - 5 reps - 45 sec hold - Hooklying Isometric Clamshell  - 1 x daily - 7 x weekly - 3 sets - 10 reps - Supine Lower  Trunk Rotation  - 1 x daily - 7 x weekly - 1 sets - 20 reps - 3 hold  Treatment priorities   Eval        balance        Global endurance        Progressive achilles loading                          ASSESSMENT:  CLINICAL IMPRESSION:  02/13/2024:  Molly Archer tolerated session well with no adverse reaction.  Pt with minimal benefit with L heel pain so far.  Encouraged her to set an alarm on her phone to complete achilles loading at least 1x/day to see if this is helpful.  Worked on low back strengthening today with fatigue noted but no increase in pain.  Encouraged her to resume aquatic therapy.  EVAL: Molly Archer is a 43 y.o. female who presents to clinic with signs and sxs consistent with chronic posterior ankle pain.  Elements of mid portion and insertional tendinopathy.  Concurrent general deconditioning, low back, and knee pain.  Knee pain not evaluated today d/t time constraints.  Molly Archer will benefit form skilled therapy to address relevant deficits and improve comfort and safety with functional mobility tasks.    OBJECTIVE IMPAIRMENTS: Pain, ankle ROM, endurance, balance, gait  ACTIVITY LIMITATIONS: standing, walking, steps, work, bending, lifting  PERSONAL FACTORS: See medical history and pertinent history   REHAB POTENTIAL: Good  CLINICAL DECISION MAKING: Evolving/moderate complexity  EVALUATION COMPLEXITY: Moderate   GOALS:   SHORT TERM GOALS: Target date: 02/18/2024   Chelise will be >75% HEP compliant to improve carryover between sessions and facilitate independent management of condition  Evaluation: ongoing Goal status: MET   LONG TERM GOALS: Target date: 03/17/2024   Molly Archer will report confidence in maintenance of balance at time of discharge with advanced HEP  Evaluation/Baseline: unable to self manage Goal status: INITIAL   2.  Molly Archer will be able to stand for >30'' in tandem stance, to show a significant improvement in balance in order to reduce fall  risk   Evaluation/Baseline: < 10'' bil Goal status: INITIAL   3.  Molly Archer will improve 30'' STS (MCID 2) to >/= 10x (w/ UE?: Y) to show improved LE strength and improved transfers   Evaluation/Baseline: 6x  w/ UE? Y Goal status: INITIAL   4.  Molly Archer will improve two minute walk test to 400 feet (MCID 40 ft).  Evaluation/Baseline: 308 ft Goal status: INITIAL   5.  Molly Archer will self report >/= 50% decrease in L posterior heel pain from evaluation to improve function in daily tasks  Evaluation/Baseline: 9/10 max pain Goal status: INITIAL   6.  Molly Archer will self report >/= 50% decrease in low back pain from evaluation to improve function in daily tasks  Evaluation/Baseline: 9/10 max pain Goal status: INITIAL   PLAN: PT FREQUENCY: 1-2x/week  PT DURATION: 8 weeks  PLANNED INTERVENTIONS: Therapeutic exercises, Aquatic therapy, Therapeutic activity, Neuro Muscular re-education, Gait training, Patient/Family education, Joint mobilization, Dry Needling, Electrical stimulation, Spinal mobilization and/or manipulation, Moist heat, Taping, Vasopneumatic device, Ionotophoresis 4mg /ml Dexamethasone , and Manual therapy   Molly Archer PT, DPT 02/13/2024, 8:25 AM   I just finished a MCD  eval/recert.  Name: Molly Archer  MRN: [id number] Please request 1x/week for 8 weeks.  Check all conditions that are expected to impact treatment: Musculoskeletal disorders   I DID put a charge in.  Check all possible CPT codes: 02889- Therapeutic Exercise, [phone]- Neuro Re-education, [phone] - Gait Training, [phone] - Manual Therapy, 97530 - Therapeutic Activities, 97535 - Self Care, [phone] - Re-evaluation, [id number] - Mechanical traction, and [id number] - Aquatic therapy   Thank you!  MCD - Secure

## 2024-02-13 ENCOUNTER — Encounter: Payer: Self-pay | Admitting: Physical Therapy

## 2024-02-18 ENCOUNTER — Encounter: Admitting: Family Medicine

## 2024-02-18 ENCOUNTER — Other Ambulatory Visit: Payer: Self-pay | Admitting: Student

## 2024-02-18 DIAGNOSIS — R1011 Right upper quadrant pain: Secondary | ICD-10-CM

## 2024-02-19 ENCOUNTER — Ambulatory Visit (INDEPENDENT_AMBULATORY_CARE_PROVIDER_SITE_OTHER): Admitting: Licensed Clinical Social Worker

## 2024-02-19 ENCOUNTER — Ambulatory Visit: Admitting: Physical Therapy

## 2024-02-19 ENCOUNTER — Encounter: Payer: Self-pay | Admitting: Physical Therapy

## 2024-02-19 DIAGNOSIS — F4322 Adjustment disorder with anxiety: Secondary | ICD-10-CM | POA: Diagnosis not present

## 2024-02-19 DIAGNOSIS — M25572 Pain in left ankle and joints of left foot: Secondary | ICD-10-CM

## 2024-02-19 DIAGNOSIS — M5459 Other low back pain: Secondary | ICD-10-CM

## 2024-02-19 DIAGNOSIS — M6281 Muscle weakness (generalized): Secondary | ICD-10-CM

## 2024-02-19 NOTE — Therapy (Unsigned)
 OUTPATIENT PHYSICAL THERAPY DAILY NOTE  Patient Name: KAMESHA HERNE MRN: [id number] DOB:1981-02-09, 43 y.o., female Today's Date: 02/20/2024   PT End of Session - 02/19/24 1632     Visit Number 4    Number of Visits 6    Date for PT Re-Evaluation 03/17/24    Authorization Type [employer]he[employer] blue    Authorization Time Period Approved 6 PT visits from 01/21/24-03/20/24    Authorization - Visit Number 4    Authorization - Number of Visits 6    PT Start Time 0430    PT Stop Time 0510    PT Time Calculation (min) 40 min           Past Medical History:  Diagnosis Date   Allergy    Anemia    pt states she took iron for this around 2019, and has had no issues since   Arthritis    knees   Asthma    only uses inhaler in fall and spring   Constipation    Diabetes mellitus without complication (HCC)    GERD (gastroesophageal reflux disease)    diet controlled, occ. uses omeprazole    Headache    otc med prn   Heart murmur    History of kidney stones    Hx of Bell's palsy 12/2011   Hypertension    Menometrorrhagia    Psoriasis    Shortness of breath dyspnea    with exercise/exertion   Vitamin D  deficiency    Past Surgical History:  Procedure Laterality Date   CHOLECYSTECTOMY N/A 01/01/2024   Procedure: LAPAROSCOPIC CHOLECYSTECTOMY;  Surgeon: Tanda Locus, MD;  Location: [facility] OR;  Service: General;  Laterality: N/A;   COMBINED HYSTEROSCOPY DIAGNOSTIC / D&C N/A 07/30/2009   Benign secretory endometrium   CYSTOSCOPY WITH STENT PLACEMENT Left 10/19/2014   Procedure: CYSTOSCOPY WITH STENT PLACEMENT;  Surgeon: Morene LELON Salines, MD;  Location: [facility];  Service: Urology;  Laterality: Left;   DILATION AND CURETTAGE OF UTERUS  10/29/2003   Polyp, mild atypia, simple and complex hyperplasia,   DILATION AND CURETTAGE OF UTERUS  11/28/2003   Simple and complex hyperplasia   DILATION AND CURETTAGE OF UTERUS  07/30/2009   DILATION AND CURETTAGE OF UTERUS N/A 12/14/2014   Procedure:  DILATATION AND CURETTAGE;  Surgeon: Winton Felt, MD;  Location: [facility];  Service: Gynecology;  Laterality: N/A;   HOLMIUM LASER APPLICATION Left 10/19/2014   Procedure: HOLMIUM LASER APPLICATION;  Surgeon: Morene LELON Salines, MD;  Location: [facility];  Service: Urology;  Laterality: Left;   HYSTEROSCOPY WITH D & C N/A 07/21/2018   Procedure: DILATATION AND CURETTAGE /HYSTEROSCOPY;  Surgeon: Eveline Lynwood MATSU, MD;  Location: [facility];  Service: Gynecology;  Laterality: N/A;   INTRAUTERINE DEVICE (IUD) INSERTION N/A 12/14/2014   Procedure: INTRAUTERINE DEVICE (IUD) INSERTION;  Surgeon: Winton Felt, MD;  Location: [facility];  Service: Gynecology;  Laterality: N/A;   KNEE CARTILAGE SURGERY Left    STONE EXTRACTION WITH BASKET Left 10/19/2014   Procedure: STONE EXTRACTION WITH BASKET;  Surgeon: Morene LELON Salines, MD;  Location: [facility];  Service: Urology;  Laterality: Left;   WISDOM TOOTH EXTRACTION     Patient Active Problem List   Diagnosis Date Noted   Physical deconditioning 01/08/2024   Chronic prescription opiate use 01/03/2024   Primary spontaneous pneumothorax 01/02/2024   Constipation 09/25/2023   Early awakening 08/26/2023   Lumbar radiculopathy 07/17/2023   Adjustment disorder with anxious mood 05/16/2023   Primary osteoarthritis of right knee  05/15/2023   Right hip pain 05/15/2023   Spondylosis of lumbar spine 05/15/2023   Cervical spondylolysis 04/16/2023   Cervical sprain 04/16/2023   Haglund's deformity of left heel 04/16/2023   Intracranial hypertension 03/04/2023   Diabetes mellitus (HCC) 03/04/2023   Primary insomnia 09/19/2022   Migraine without aura and without status migrainosus, not intractable 05/02/2020   Complex tear of lateral meniscus of left knee as current injury 12/22/2019   Iron overload 12/21/2019   Derangement of left knee 09/08/2019   Dermatitis 12/18/2018   Vitamin D  deficiency 09/25/2018   Absolute anemia 09/25/2018   Class 3 severe obesity with serious  comorbidity and body mass index (BMI) of 50.0 to 59.9 in adult 09/25/2018   Thickened endometrium 07/20/2018   History of endometrial hyperplasia 07/20/2018   Morbid obesity with BMI of 50.0-59.9, adult (HCC) 07/20/2018   Abnormal uterine bleeding (AUB)    Essential hypertension 02/25/2012   Hx of Bell's palsy 02/25/2012    PCP: de Peru, Raymond J, MD  REFERRING PROVIDER: de [place], Raymond J, MD  THERAPY DIAG:  Pain in left ankle and joints of left foot  Other low back pain  Muscle weakness (generalized)  REFERRING DIAG: Achilles tendinitis, left leg [M76.62]  Rationale for Evaluation and Treatment:  Rehabilitation  SUBJECTIVE:  PERTINENT PAST HISTORY:  Hx of bell's palsy, DM, L meniscus tear, migraine        PRECAUTIONS: cholecystectomy 6/4 with subsequent spontaeous pneumothorax  WEIGHT BEARING RESTRICTIONS  No lifting more than 15#  FALLS:  Has patient fallen in last 6 months? No, Number of falls: 0  MOI/History of condition:  Onset date: multiple years  SUBJECTIVE STATEMENT   02/20/2024: Pt reports that her pain is about the same.  She has seen minimal benefit from PT so far.   MEMORY HEINRICHS is a 43 y.o. female who presents to clinic with chief complaint of L achilles pain.  She has a recent history of cholecystectomy with subsequent pneumothorax in early June.  Other significant history of low back and bil knee pain which improved somewhat with round of therapy at [facility] earlier this year.  Her back was doing fairly well but she had an incident when getting into the shower after her surgery that flared her sxs and her pain can reach a 9/10.  The achilles pain has been ongoing with occasional flares for many years.  She has been having more pain over the last 3 months with this.  She is wearing a heel lift which is not very helpful.  She has tried to [employer] but this is unhelpful.  She has some insertional and mid portion pain.  She feels this prevents her from  walking longer distances.  She would like to concentrate on her ankle pain today.  From recent hospital D/C: 43 y.o. female with hx of asthma, HTN, morbid obesity, recent hx chronic cholecystitis / bilary dyskinesia s/p lap cholecystectomy by Dr. Tanda on 6/4 who is transferred from [facility] ED for pneumothorax. Reports that after her surgery she had slight amount of SOB and R sided chest discomfort. Gradual onset. This AM was much worse and had significant R chest pain and SOB with any exertion, called surgical center who referred to ED. Found to have a moderate-large R PTX without mediastinal shift and chest tube was placed by outside EDP.  After chest tube placement she was admitted to the hospitalist team, PCCM was consulted for chest tube management.   Red flags:  denies  Pain:  Are you having pain? Yes Pain location: L achilles NPRS scale:  Average: 8/10, Worst: 9/10 Aggravating factors: walking, standing, going up or down steps Relieving factors: min relieving factors Pain description: sharp and aching  Are you having pain? 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  Assistive Device: NA  Hand Dominance: NA  Patient Goals/Specific Activities: reduce pain, walk and stand for longer   OBJECTIVE:   DIAGNOSTIC FINDINGS:  None recent  GENERAL OBSERVATION/GAIT: Slow antalgic gait with WBOS  LE MMT:  MMT Right (Eval) Left (Eval)  Hip flexion (L2, L3)    Knee extension (L3) n n  Knee flexion n n  Hip abduction    Hip extension    Hip external rotation    Hip internal rotation    Hip adduction    Ankle dorsiflexion (L4) n n  Ankle plantarflexion (S1)  Painful MMT - unable to SL heel lift  Ankle inversion n n  Ankle eversion n n  Great Toe ext (L5)    Grossly      (Blank rows = not tested, score listed is out of 5 possible points OR may be listed in lbs of force.  N = WNL, D = diminished, C = clear for gross weakness with myotome testing, * = concordant pain with testing)  LE ROM:  ROM Right (Eval) Left (Eval)  Hip flexion    Hip extension    Hip abduction    Hip adduction    Hip internal rotation    Hip external rotation    Knee extension    Knee flexion    Ankle dorsiflexion 0 0*  Ankle plantarflexion n n  Ankle inversion n n  Ankle eversion     (Blank rows = not tested, N = WNL, * = concordant pain with testing)  Functional Tests  Eval    30'' STS: 6x  UE used? Y        Progressive balance screen (highest level completed for >/= 10''):  Feet together: 10'' Semi Tandem: R in rear 10'', L in rear 10'' Tandem: R in rear 5'', L in rear 4'' SLS: R unable, L unable     2' MWT: 308'                                              PATIENT SURVEYS:  LEFS: 17/80  TODAY'S TREATMENT   [facility] Adult PT Treatment  02/20/2024:  Therapeutic Exercise: LTR Clam shell blue TB - 3x10 SLR - 2x10 ea Hip adduction squeeze - 5'' hold 2x10 Bridge - 3x5  Therapeutic Activity  STS from raised table - 4x5 Checking goals  Modalities:   Type: Dexamethasone  Location: L achilles insertion Time: 3 hrs (pt instructed to remove at home)       HOME EXERCISE PROGRAM: Access Code: [email or web address] URL: [email or web address] Date: 02/05/2024 Prepared by: Helene Gasmen  Exercises - Sit to Stand Without Arm Support  - 1-2 x daily - 7 x weekly - 3 sets - 5 reps - Standing Tandem Balance with Counter Support  - 1 x daily - 7 x weekly - 3 sets - 45'' hold - Heel Raises with Counter Support  - 1-2 x  daily - 7 x weekly - 1 sets - 5 reps - 45 sec hold - Hooklying Isometric Clamshell  - 1 x daily - 7 x weekly - 3 sets - 10 reps - Supine Lower Trunk Rotation  - 1 x daily - 7 x weekly - 1 sets - 20 reps - 3 hold  Treatment  priorities   Eval        balance        Global endurance        Progressive achilles loading                          ASSESSMENT:  CLINICAL IMPRESSION:  02/20/2024:  Madylin has made minimal progress with PT so far.  She remains extremely limited by her L achilles and heel pain.  Unfortunately it has been difficult to find an entry point into exercise for her that is not agging.  She found band based isometrics to be agging and has not tolerated wb isometrics.  Given her lack of progress and her limited remaining visits, I suggested she contact her podiatrists for next steps as she is having difficulty participating in PT given her pain.  EVAL: Khyler is a 43 y.o. female who presents to clinic with signs and sxs consistent with chronic posterior ankle pain.  Elements of mid portion and insertional tendinopathy.  Concurrent general deconditioning, low back, and knee pain.  Knee pain not evaluated today d/t time constraints.  Brennley will benefit form skilled therapy to address relevant deficits and improve comfort and safety with functional mobility tasks.    OBJECTIVE IMPAIRMENTS: Pain, ankle ROM, endurance, balance, gait  ACTIVITY LIMITATIONS: standing, walking, steps, work, bending, lifting  PERSONAL FACTORS: See medical history and pertinent history   REHAB POTENTIAL: Good  CLINICAL DECISION MAKING: Evolving/moderate complexity  EVALUATION COMPLEXITY: Moderate   GOALS:   SHORT TERM GOALS: Target date: 02/18/2024   Marquisa will be >75% HEP compliant to improve carryover between sessions and facilitate independent management of condition  Evaluation: ongoing Goal status: MET   LONG TERM GOALS: Target date: 03/17/2024   Princesa will report confidence in maintenance of balance at time of discharge with advanced HEP  Evaluation/Baseline: unable to self manage Goal status: Ongoing   2.  Nikaya will be able to stand for >30'' in tandem stance, to show a significant  improvement in balance in order to reduce fall risk   Evaluation/Baseline: < 10'' bil Goal status: INITIAL   3.  Douglas will improve 30'' STS (MCID 2) to >/= 10x (w/ UE?: Y) to show improved LE strength and improved transfers   Evaluation/Baseline: 6x  w/ UE? Y 7/23: 7x w/o UE  Goal status: Ongoing   4.  Fayth will improve two minute walk test to 400 feet (MCID 40 ft).  Evaluation/Baseline: 308 ft Goal status: INITIAL   5.  Antanisha will self report >/= 50% decrease in L posterior heel pain from evaluation to improve function in daily tasks  Evaluation/Baseline: 9/10 max pain 7/23: about the same Goal status: onging   6.  Breann will self report >/= 50% decrease in low back pain from evaluation to improve function in daily tasks  Evaluation/Baseline: 9/10 max pain 7/23: about the same Goal status: ongoing   PLAN: PT FREQUENCY: 1-2x/week  PT DURATION: 8 weeks  PLANNED INTERVENTIONS: Therapeutic exercises, Aquatic therapy, Therapeutic activity, Neuro Muscular re-education, Gait training, Patient/Family education, Joint mobilization, Dry Needling, Electrical stimulation, Spinal mobilization  and/or manipulation, Moist heat, Taping, Vasopneumatic device, Ionotophoresis 4mg /ml Dexamethasone , and Manual therapy   Helene Gasmen PT, DPT 02/20/2024, 7:11 AM   I just finished a MCD eval/recert.  Name: OUITA NISH  MRN: [id number] Please request 1x/week for 8 weeks.  Check all conditions that are expected to impact treatment: Musculoskeletal disorders   I DID put a charge in.  Check all possible CPT codes: 02889- Therapeutic Exercise, [phone]- Neuro Re-education, [phone] - Gait Training, [phone] - Manual Therapy, 97530 - Therapeutic Activities, 97535 - Self Care, [phone] - Re-evaluation, [id number] - Mechanical traction, and [phone] - Aquatic therapy   Thank you!  MCD - Secure

## 2024-02-19 NOTE — Progress Notes (Signed)
 [facility] Counselor/Therapist Progress Note  Patient ID: Molly Archer, MRN: [id number]    Date: 02/19/24  Time Spent: 0204  pm - 0251 pm : 47 Minutes  Treatment Type: Individual Therapy.  Reported Symptoms: anxiety related to social interaction and church relationships and work   Mental Status Exam: Appearance:  Casual     Behavior: Appropriate  Motor: Normal  Speech/Language:  Clear and Coherent  Affect: Appropriate  Mood: normal  Thought process: normal  Thought content:   WNL  Sensory/Perceptual disturbances:   WNL  Orientation: oriented to person, place, time/date, situation, day of week, month of year, and year  Attention: Good  Concentration: Good  Memory: WNL  Fund of knowledge:  Good  Insight:   Good  Judgment:  Good  Impulse Control: Good    Risk Assessment: Danger to Self:  No Self-injurious Behavior: No Danger to Others: No Duty to Warn:no Physical Aggression / Violence:No  Access to Firearms a concern: No  Gang Involvement:No    Subjective:    Molly Archer participated from home, via video, patient was aware of risk and limitations, and consented to treatment. Therapist participated from office. We met online due to patient request.   Molly Archer presented for her session stating she has been struggling. Patient reports that she find she wakes up on Sunday mornings and has a sick feeling in her stomach. She reports that the first 3 hours of the day at work are also like that. She states she doesn't know what it is, but it has became a pattern. Patient reports that she is working on doing things for herself and went to the pool over the weekend. She reports that she went early in the morning but enjoyed and her Mom went for moral support. Patient did identify that she feels her mobility issues prevents her from doing more and wants to find a way to lose weight to improve her overall health and wellness.   Clinician actively listened and  provided encouragement and support via verbal feedback. Clinician processed with patient the importance of focusing on her health and happiness and considering if a job change is what she may need along with additional changes to her lifestyle. Clinician processed with patient her concerns and provided positive support and coping skill suggestions.  Molly Archer was fully engaged in session discussion and was transparent about her thoughts. Molly Archer is motivated for treatment but reports feeling stuck. Molly Archer will continue to utilize coping skills and will continue to engage in bi weekly therapy sessions. Treatment planning will be reviewed by 05/15/2024.  Interventions: Cognitive Behavioral Therapy, Assertiveness/Communication, and Motivational Interviewing  Diagnosis: Adjustment Disorder with anxious mood   Damien Junk MSW, LCSW/DATE 02/19/2024

## 2024-02-20 ENCOUNTER — Ambulatory Visit: Admitting: Obstetrics & Gynecology

## 2024-02-20 ENCOUNTER — Encounter: Payer: Self-pay | Admitting: Physical Therapy

## 2024-02-20 VITALS — BP 153/77 | HR 89 | Wt 338.0 lb

## 2024-02-20 DIAGNOSIS — R9389 Abnormal findings on diagnostic imaging of other specified body structures: Secondary | ICD-10-CM | POA: Diagnosis not present

## 2024-02-20 DIAGNOSIS — Z1331 Encounter for screening for depression: Secondary | ICD-10-CM

## 2024-02-20 DIAGNOSIS — Z8742 Personal history of other diseases of the female genital tract: Secondary | ICD-10-CM

## 2024-02-20 NOTE — Progress Notes (Signed)
 GYNECOLOGY OFFICE VISIT NOTE  History:  Molly Archer is a 43 y.o. G0P0000 here today for second opinion about further evaluation of her thickened endometrium, in the setting of known history of complex endometrial hyperplasia.  Followed by Dr. Vicenta Ligas in [facility].  Patient has a complex medical history; morbid obesity, OSA, T2DM, HTN and other issues.  She had a history of AUB in the past and was diagnosed with complex hyperplasia after evaluation, this has been managed with progestin therapy (currently on Aygestin  15 mg daily, she reports having two Mirena  IUDs placed but both were expelled).  Her endometrial thickness on ultrasound was reassuring until her recent scan in May 2025; she was noted to have a thickened endometrium at 11 mm with vascular areas.  Repeat hysteroscopy, Dilation and Curettage was recommended.  Patient wants a second opinion about this recommendation. She denies any abnormal vaginal discharge, bleeding, pelvic pain or other concerns.  Of note, patient had a laparoscopic cholescystectomy on 01/01/2024, this was complicated by spontaneous right pneumothorax resulting in chest tube placement, ICU.  She is doing well now from that standpoint.  Past Medical History:  Diagnosis Date   Allergy    Anemia    pt states she took iron for this around 2019, and has had no issues since   Arthritis    knees   Asthma    only uses inhaler in fall and spring   Constipation    Diabetes mellitus without complication (HCC)    GERD (gastroesophageal reflux disease)    diet controlled, occ. uses omeprazole    Headache    otc med prn   Heart murmur    History of kidney stones    Hx of Bell's palsy 12/2011   Hypertension    Menometrorrhagia    Psoriasis    Shortness of breath dyspnea    with exercise/exertion   Vitamin D  deficiency     Past Surgical History:  Procedure Laterality Date   CHOLECYSTECTOMY N/A 01/01/2024   Procedure: LAPAROSCOPIC CHOLECYSTECTOMY;  Surgeon:  Tanda Locus, MD;  Location: [facility] OR;  Service: General;  Laterality: N/A;   COMBINED HYSTEROSCOPY DIAGNOSTIC / D&C N/A 07/30/2009   Benign secretory endometrium   CYSTOSCOPY WITH STENT PLACEMENT Left 10/19/2014   Procedure: CYSTOSCOPY WITH STENT PLACEMENT;  Surgeon: Morene LELON Salines, MD;  Location: [facility];  Service: Urology;  Laterality: Left;   DILATION AND CURETTAGE OF UTERUS  10/29/2003   Polyp, mild atypia, simple and complex hyperplasia,   DILATION AND CURETTAGE OF UTERUS  11/28/2003   Simple and complex hyperplasia   DILATION AND CURETTAGE OF UTERUS  07/30/2009   DILATION AND CURETTAGE OF UTERUS N/A 12/14/2014   Procedure: DILATATION AND CURETTAGE;  Surgeon: Winton Felt, MD;  Location: [facility];  Service: Gynecology;  Laterality: N/A;   HOLMIUM LASER APPLICATION Left 10/19/2014   Procedure: HOLMIUM LASER APPLICATION;  Surgeon: Morene LELON Salines, MD;  Location: [facility];  Service: Urology;  Laterality: Left;   HYSTEROSCOPY WITH D & C N/A 07/21/2018   Procedure: DILATATION AND CURETTAGE /HYSTEROSCOPY;  Surgeon: Eveline Lynwood MATSU, MD;  Location: [facility];  Service: Gynecology;  Laterality: N/A;   INTRAUTERINE DEVICE (IUD) INSERTION N/A 12/14/2014   Procedure: INTRAUTERINE DEVICE (IUD) INSERTION;  Surgeon: Winton Felt, MD;  Location: [facility];  Service: Gynecology;  Laterality: N/A;   KNEE CARTILAGE SURGERY Left    STONE EXTRACTION WITH BASKET Left 10/19/2014   Procedure: STONE EXTRACTION WITH BASKET;  Surgeon: Morene LELON Salines, MD;  Location: [facility];  Service: Urology;  Laterality: Left;   WISDOM TOOTH EXTRACTION      The following portions of the patient's history were reviewed and updated as appropriate: allergies, current medications, past family history, past medical history, past social history, past surgical history and problem list.   Health Maintenance:  Normal pap and negative HRHPV on 11/13/22.  Normal mammogram on 01/01/2023.   Review of Systems:  Pertinent items noted in HPI  and remainder of comprehensive ROS otherwise negative.  Physical Exam:  BP (!) 153/77   Pulse 89   Wt (!) 338 lb (153.3 kg)   BMI 56.25 kg/m  CONSTITUTIONAL: Well-developed, well-nourished female in no acute distress.  SKIN: No rash noted. Not diaphoretic. No erythema. No pallor. MUSCULOSKELETAL: Normal range of motion. No edema noted. NEUROLOGIC: Alert and oriented to person, place, and time. Normal muscle tone coordination. No cranial nerve deficit noted on observation. PSYCHIATRIC: Normal mood and affect. Normal behavior. Normal judgment and thought content. CARDIOVASCULAR: Normal heart rate noted RESPIRATORY: Effort and breath sounds normal, no problems with respiration noted ABDOMEN: No masses or other overt distention noted on observation. No tenderness.   PELVIC: Deferred   Assessment and Plan:     1. Thickened endometrium 2. History of complex endometrial hyperplasia (Primary) Patient was reassured that the recommendations of her GYN provider are the same that I would also recommend, given her history.  She was told to call them to set up appointment for the proposed procedure. However, she should let them know about the recent surgery and associated complications as this may alter the plan/preparation for the procedure.  She may benefit from doing this procedure at the main [facility] hospital (tertiary center), given her recent postsurgical complication and co-morbidities.  In the meantime, she will continue Aygestin  as prescribed.  I spent 45 minutes dedicated to the care of this patient including pre-visit review of records, face to face time with the patient discussing her conditions and treatments, post visit ordering of medications and appropriate tests or procedures, coordinating care and documenting this visit encounter.    GLORIS HUGGER, MD, FACOG Obstetrician & Gynecologist, [facility] for [employer], [facility]

## 2024-02-21 ENCOUNTER — Ambulatory Visit
Admission: RE | Admit: 2024-02-21 | Discharge: 2024-02-21 | Disposition: A | Discharge status: Home / Self Care | Source: Ambulatory Visit | Attending: Student | Admitting: Student

## 2024-02-21 DIAGNOSIS — R1011 Right upper quadrant pain: Secondary | ICD-10-CM

## 2024-02-21 MED ORDER — IOPAMIDOL (ISOVUE-370) INJECTION 76%
80.0000 mL | Freq: Once | INTRAVENOUS | Status: AC | PRN
  Administered 2024-02-21: 80 mL via INTRAVENOUS

## 2024-02-27 ENCOUNTER — Other Ambulatory Visit: Payer: Self-pay

## 2024-02-27 ENCOUNTER — Encounter: Payer: Self-pay | Admitting: Sports Medicine

## 2024-02-27 ENCOUNTER — Ambulatory Visit: Admitting: Sports Medicine

## 2024-02-27 DIAGNOSIS — Z794 Long term (current) use of insulin: Secondary | ICD-10-CM

## 2024-02-27 DIAGNOSIS — M7918 Myalgia, other site: Secondary | ICD-10-CM | POA: Diagnosis not present

## 2024-02-27 DIAGNOSIS — M533 Sacrococcygeal disorders, not elsewhere classified: Secondary | ICD-10-CM

## 2024-02-27 DIAGNOSIS — E1165 Type 2 diabetes mellitus with hyperglycemia: Secondary | ICD-10-CM

## 2024-02-27 DIAGNOSIS — G8929 Other chronic pain: Secondary | ICD-10-CM | POA: Diagnosis not present

## 2024-02-27 NOTE — Progress Notes (Signed)
 Molly Archer - 43 y.o. female MRN [id number]  Date of birth: 1981/03/23  Office Visit Note: Visit Date: 02/27/2024 PCP: de Peru, Raymond J, MD Referred by: Genelle Standing, MD  Subjective: Chief Complaint  Patient presents with   Lower Back - Pain   HPI: Molly Archer is a pleasant 43 y.o. female who presents today for chronic right-sided low back pain.  She was referred by Dr. Genelle.  She has seen Dr. Eldonna here and has had 2 spinal injections which have not provided her much relief.  She is involved in pain management and does take oxycodone  10 mg every 8 hours as needed for pain.  She also has tried Robaxin  500 mg as needed, these take the edge off but she is still having discomfort in the right side of the low back that does extend into the buttock.  She is a type-II diabetic. She is managed on Insulin  glargine 10 units daily, although did have a mixup with refrigeration of her insulin  and is working with PCP/pharmacy for longer supply.  She does check her bG every morning.  She did not have large increase in blood glucose after previous spinal injections with Dr.Newton. Lab Results  Component Value Date   HGBA1C 8.0 (H) 11/12/2023   Pertinent ROS were reviewed with the patient and found to be negative unless otherwise specified above in HPI.   Assessment & Plan: Visit Diagnoses:  1. Chronic right SI joint pain   2. Pain in right buttock   3. Type 2 diabetes mellitus with hyperglycemia, with long-term current use of insulin  (HCC)    Plan: Impression is acute on chronic right-sided low back and buttock pain with pain emanating from right SI joint dysfunction.  She did have an MRI of the lumbar spine which did show disc extrusions at the L4-L5 level, although she has underwent 2 previous spinal injections with Dr. Eldonna under fluoroscopic guidance which did not provide her much relief.  Through shared decision making, we did proceed with ultrasound-guided right SI joint  injection, patient tolerated well.  Advised on postinjection protocol.  Did discuss potential for transient rise in blood glucose, she will check her sugars once daily.  She will continue her insulin  glargine 10 units daily and adjust as indicated.  She does see pain management and is managed on oxycodone  10 mg every 8 hours as needed, she will continue this for pain control, okay for ice/heat or over-the-counter anti-inflammatories for any acute postinjection pain.  She will follow-up with Dr. Genelle as needed.  Follow-up: Return if symptoms worsen or fail to improve, for F/u with Dr. Genelle as indicated.   Meds & Orders: No orders of the defined types were placed in this encounter.   Orders Placed This Encounter  Procedures   US  Guided Needle Placement - No Linked Charges     Procedures: U/S-guided SI-joint injection, Right   After discussion of risk/benefits/indications, informed verbal consent was obtained. A timeout was then performed. The patient was positioned in a prone position on exam room table with a pillow placed under the pelvis for mild hip flexion. The SI joint area was cleaned and prepped with betadine and alcohol swabs. Sterile ultrasound gel was applied and the ultrasound transducer was placed in an anatomic axial plane over the PSIS, then moved distally over the SI-joint. Using ultrasound guidance, a 22-gauge, 3.5 needle was inserted from a medial to lateral approach utilizing an in-plane approach and directed into the SI-joint. The  SI-joint was then injected with a mixture of 4:1 lidocaine :depomedrol with visualization of the injectate flow into the SI-joint under ultrasound visualization. The patient tolerated the procedure well without immediate complications.       Clinical History: Study Result  Narrative & Impression CLINICAL DATA:  Low back pain, symptoms persist with > 6 wks treatment   EXAM: MRI LUMBAR SPINE WITHOUT CONTRAST   TECHNIQUE: Multiplanar,  multisequence MR imaging of the lumbar spine was performed. No intravenous contrast was administered.   COMPARISON:  Lumbar spine MRI 10/07/2021   FINDINGS: Segmentation:  Standard.   Alignment:  Physiologic.   Vertebrae:  No fracture, evidence of discitis, or bone lesion.   Conus medullaris and cauda equina: Conus extends to the L1 level. Conus and cauda equina appear normal.   Paraspinal and other soft tissues: Negative.   Disc levels:   T12-L1: Mild bilateral facet degenerative change. No spinal canal narrowing. No neural foraminal narrowing.   L1-L2: Mild bilateral facet degenerative change. No significant disc bulge. No spinal canal narrowing. No neural foraminal narrowing.   L2-L3: Mild bilateral facet degenerative change. No spinal canal narrowing. No neural foraminal narrowing.   L3-L4: Moderate bilateral facet degenerative change with fluid in the facet joints. No significant disc bulge. No spinal canal narrowing. Mild bilateral neural foraminal narrowing.   L4-L5: Moderate bilateral facet degenerative change. Redemonstrated right foraminal disc protrusion which likely contacts the exiting right L4 nerve root, unchanged. Mild spinal canal narrowing. Mild bilateral neural foraminal narrowing.   L5-S1: Moderate bilateral facet degenerative change. No significant disc bulge. No spinal canal narrowing. No neural foraminal narrowing.   IMPRESSION: 1. Redemonstrated right foraminal disc protrusion at L4-L5 which likely contacts the exiting right L4 nerve root, unchanged. 2. Moderate bilateral facet degenerative change at L3-L4 and L5-S1, unchanged.     Electronically Signed   By: Lyndall Gore M.D.   On: 06/12/2023 13:29  She reports that she has never smoked. She has never been exposed to tobacco smoke. She has never used smokeless tobacco.  Recent Labs    03/04/23 1619 07/16/23 1529 11/12/23 1629  HGBA1C 7.7* 7.9 8.0*    Objective:    Physical Exam   Gen: Well-appearing, in no acute distress; non-toxic CV: Well-perfused. Warm.  Resp: Breathing unlabored on room air; no wheezing. Psych: Fluid speech in conversation; appropriate affect; normal thought process  Ortho Exam - Lumbar/SI joints: No midline spinous process TTP.  There is pain overlying the right SI joint with positive Fortin's point test on the right as well as reciprocal right upper gluteal tenderness to palpation.  Imaging:  *Independent review and interpretation of 4 view lumbar spine x-ray from 01/31/2024 was performed by myself today.  There is mild to some moderate lumbar degenerative disc disease throughout the entirety of the lumbar spine, there is at least moderate facet hypertrophy, most notable at the L4-L5 region.  With flexion/extension views there is a trace retrolisthesis of L5.  There is no acute fracture noted.  There is some right greater than left SI joint sclerosis of the no advanced arthritic change.  - Lumbar XR 01/27/24: CLINICAL DATA: Chronic bilateral low back pain without sciatica.  EXAM: LUMBAR SPINE - COMPLETE 4+ VIEW  COMPARISON: MRI 05/19/2023  FINDINGS: Five non-rib-bearing lumbar vertebra. The alignment is normal. No abnormal motion on flexion or extension. Vertebral body heights are normal. Endplate spurring at multiple levels with preservation of disc spaces. There is diffuse facet hypertrophy, most prominent at L4-L5. No visible  pars defects or focal bone abnormalities. Sacroiliac joints are congruent.  IMPRESSION: 1. Multilevel degenerative disc disease and facet hypertrophy. 2. No abnormal motion on flexion or extension.   Electronically Signed By: Andrea Gasman M.D. On: 01/31/2024 15:04   Past Medical/Family/Surgical/Social History: Medications & Allergies reviewed per EMR, new medications updated. Patient Active Problem List   Diagnosis Date Noted   Physical deconditioning 01/08/2024   Chronic prescription opiate use  01/03/2024   Primary spontaneous pneumothorax 01/02/2024   Constipation 09/25/2023   Early awakening 08/26/2023   Lumbar radiculopathy 07/17/2023   Adjustment disorder with anxious mood 05/16/2023   Primary osteoarthritis of right knee 05/15/2023   Right hip pain 05/15/2023   Spondylosis of lumbar spine 05/15/2023   Cervical spondylolysis 04/16/2023   Cervical sprain 04/16/2023   Haglund's deformity of left heel 04/16/2023   Intracranial hypertension 03/04/2023   Diabetes mellitus (HCC) 03/04/2023   Primary insomnia 09/19/2022   Migraine without aura and without status migrainosus, not intractable 05/02/2020   Complex tear of lateral meniscus of left knee as current injury 12/22/2019   Iron overload 12/21/2019   Derangement of left knee 09/08/2019   Dermatitis 12/18/2018   Vitamin D  deficiency 09/25/2018   Absolute anemia 09/25/2018   Class 3 severe obesity with serious comorbidity and body mass index (BMI) of 50.0 to 59.9 in adult 09/25/2018   Thickened endometrium 07/20/2018   History of complex endometrial hyperplasia 07/20/2018   Morbid obesity with BMI of 50.0-59.9, adult (HCC) 07/20/2018   Abnormal uterine bleeding (AUB)    Essential hypertension 02/25/2012   Hx of Bell's palsy 02/25/2012   Past Medical History:  Diagnosis Date   Allergy    Anemia    pt states she took iron for this around 2019, and has had no issues since   Arthritis    knees   Asthma    only uses inhaler in fall and spring   Constipation    Diabetes mellitus without complication (HCC)    GERD (gastroesophageal reflux disease)    diet controlled, occ. uses omeprazole    Headache    otc med prn   Heart murmur    History of kidney stones    Hx of Bell's palsy 12/2011   Hypertension    Menometrorrhagia    Psoriasis    Shortness of breath dyspnea    with exercise/exertion   Vitamin D  deficiency    Family History  Problem Relation Age of Onset   Hypertension Mother    Hyperlipidemia Mother     Diabetes Mother    Hypertension Father    Diabetes Father    Stroke Maternal Grandmother    Cancer Maternal Grandfather    Hypertension Other    Diabetes Other    Cancer Other    Cancer Maternal Aunt    Diabetes Maternal Aunt    Diabetes Maternal Uncle    Liver disease Neg Hx    Colon cancer Neg Hx    Esophageal cancer Neg Hx    Past Surgical History:  Procedure Laterality Date   CHOLECYSTECTOMY N/A 01/01/2024   Procedure: LAPAROSCOPIC CHOLECYSTECTOMY;  Surgeon: Tanda Locus, MD;  Location: [facility] OR;  Service: General;  Laterality: N/A;   COMBINED HYSTEROSCOPY DIAGNOSTIC / D&C N/A 07/30/2009   Benign secretory endometrium   CYSTOSCOPY WITH STENT PLACEMENT Left 10/19/2014   Procedure: CYSTOSCOPY WITH STENT PLACEMENT;  Surgeon: Morene LELON Salines, MD;  Location: [facility] ORS;  Service: Urology;  Laterality: Left;   DILATION AND CURETTAGE OF UTERUS  10/29/2003   Polyp, mild atypia, simple and complex hyperplasia,   DILATION AND CURETTAGE OF UTERUS  11/28/2003   Simple and complex hyperplasia   DILATION AND CURETTAGE OF UTERUS  07/30/2009   DILATION AND CURETTAGE OF UTERUS N/A 12/14/2014   Procedure: DILATATION AND CURETTAGE;  Surgeon: Winton Felt, MD;  Location: [facility] OR[facility];  Service: Gynecology;  Laterality: N/A;   HOLMIUM LASER APPLICATION Left 10/19/2014   Procedure: HOLMIUM LASER APPLICATION;  Surgeon: Morene LELON Salines, MD;  Location: [facility];  Service: Urology;  Laterality: Left;   HYSTEROSCOPY WITH D & C N/A 07/21/2018   Procedure: DILATATION AND CURETTAGE /HYSTEROSCOPY;  Surgeon: Eveline Lynwood MATSU, MD;  Location: [facility] OR[facility];  Service: Gynecology;  Laterality: N/A;   INTRAUTERINE DEVICE (IUD) INSERTION N/A 12/14/2014   Procedure: INTRAUTERINE DEVICE (IUD) INSERTION;  Surgeon: Winton Felt, MD;  Location: [facility];  Service: Gynecology;  Laterality: N/A;   KNEE CARTILAGE SURGERY Left    STONE EXTRACTION WITH BASKET Left 10/19/2014   Procedure: STONE EXTRACTION WITH BASKET;  Surgeon: Morene LELON Salines, MD;  Location: [facility];  Service: Urology;  Laterality: Left;   WISDOM TOOTH EXTRACTION     Social History   Occupational History   Not on file  Tobacco Use   Smoking status: Never    Passive exposure: Never   Smokeless tobacco: Never   Tobacco comments:    Never Smoked  Vaping Use   Vaping status: Never Used  Substance and Sexual Activity   Alcohol use: Never   Drug use: Never   Sexual activity: Not Currently    Birth control/protection: Abstinence, Pill

## 2024-02-28 ENCOUNTER — Other Ambulatory Visit (HOSPITAL_BASED_OUTPATIENT_CLINIC_OR_DEPARTMENT_OTHER): Payer: Self-pay | Admitting: Family Medicine

## 2024-02-28 ENCOUNTER — Other Ambulatory Visit (HOSPITAL_BASED_OUTPATIENT_CLINIC_OR_DEPARTMENT_OTHER): Payer: Self-pay

## 2024-02-28 ENCOUNTER — Other Ambulatory Visit: Payer: Self-pay

## 2024-03-02 ENCOUNTER — Ambulatory Visit (HOSPITAL_BASED_OUTPATIENT_CLINIC_OR_DEPARTMENT_OTHER): Admitting: Family Medicine

## 2024-03-02 ENCOUNTER — Other Ambulatory Visit (HOSPITAL_BASED_OUTPATIENT_CLINIC_OR_DEPARTMENT_OTHER): Payer: Self-pay

## 2024-03-02 MED ORDER — ZOLPIDEM TARTRATE 5 MG PO TABS
5.0000 mg | ORAL_TABLET | Freq: Every evening | ORAL | 0 refills | Status: DC | PRN
  Filled 2024-03-02: qty 30, 30d supply, fill #0

## 2024-03-02 MED ORDER — TRIAMCINOLONE ACETONIDE 0.1 % EX CREA
1.0000 | TOPICAL_CREAM | Freq: Two times a day (BID) | CUTANEOUS | 0 refills | Status: DC
  Filled 2024-03-02: qty 30, 15d supply, fill #0

## 2024-03-04 ENCOUNTER — Ambulatory Visit: Admitting: Licensed Clinical Social Worker

## 2024-03-04 ENCOUNTER — Ambulatory Visit (HOSPITAL_BASED_OUTPATIENT_CLINIC_OR_DEPARTMENT_OTHER): Admitting: Physical Therapy

## 2024-03-07 ENCOUNTER — Other Ambulatory Visit (HOSPITAL_BASED_OUTPATIENT_CLINIC_OR_DEPARTMENT_OTHER): Payer: Self-pay

## 2024-03-08 ENCOUNTER — Other Ambulatory Visit (HOSPITAL_BASED_OUTPATIENT_CLINIC_OR_DEPARTMENT_OTHER): Payer: Self-pay

## 2024-03-09 ENCOUNTER — Other Ambulatory Visit (HOSPITAL_BASED_OUTPATIENT_CLINIC_OR_DEPARTMENT_OTHER): Payer: Self-pay

## 2024-03-11 ENCOUNTER — Encounter: Payer: Self-pay | Admitting: Podiatry

## 2024-03-11 ENCOUNTER — Other Ambulatory Visit: Payer: Self-pay

## 2024-03-11 ENCOUNTER — Other Ambulatory Visit (HOSPITAL_BASED_OUTPATIENT_CLINIC_OR_DEPARTMENT_OTHER): Payer: Self-pay

## 2024-03-11 ENCOUNTER — Ambulatory Visit: Admitting: Podiatry

## 2024-03-11 DIAGNOSIS — M722 Plantar fascial fibromatosis: Secondary | ICD-10-CM

## 2024-03-11 DIAGNOSIS — M7662 Achilles tendinitis, left leg: Secondary | ICD-10-CM

## 2024-03-11 MED ORDER — DEXAMETHASONE SODIUM PHOSPHATE 120 MG/30ML IJ SOLN
4.0000 mg | Freq: Once | INTRAMUSCULAR | Status: AC
  Administered 2024-03-11 (×2): 4 mg via INTRA_ARTICULAR

## 2024-03-11 MED ORDER — TRIAMCINOLONE ACETONIDE 10 MG/ML IJ SUSP
2.5000 mg | Freq: Once | INTRAMUSCULAR | Status: AC
  Administered 2024-03-11 (×2): 2.5 mg via INTRA_ARTICULAR

## 2024-03-11 MED ORDER — NITROGLYCERIN 0.1 MG/HR TD PT24
0.1000 mg | MEDICATED_PATCH | Freq: Every day | TRANSDERMAL | 12 refills | Status: AC
  Filled 2024-03-11: qty 30, 30d supply, fill #0

## 2024-03-11 NOTE — Progress Notes (Signed)
  Subjective:  Patient ID: Molly Archer, female    DOB: 07-30-1981,   MRN: [id number]  Chief Complaint  Patient presents with   Foot Pain    Follow up achilles tendonitis left   Its not getting better. It hurts all the way up and the bottom of the heel. I really want a shot    43 y.o. female presents for follow-up of left achilles tendonitis. Relates she is not doing much better and still getting pain. Relates getting more pain at the bottom of her heel now and Pt mentioned plantar fasciitis. .  Denies any other pedal complaints. Denies n/v/f/c.   Past Medical History:  Diagnosis Date   Allergy    Anemia    pt states she took iron for this around 2019, and has had no issues since   Arthritis    knees   Asthma    only uses inhaler in fall and spring   Constipation    Diabetes mellitus without complication (HCC)    GERD (gastroesophageal reflux disease)    diet controlled, occ. uses omeprazole    Headache    otc med prn   Heart murmur    History of kidney stones    Hx of Bell's palsy 12/2011   Hypertension    Menometrorrhagia    Psoriasis    Shortness of breath dyspnea    with exercise/exertion   Vitamin D  deficiency     Objective:  Physical Exam: Vascular: DP/PT pulses 2/4 bilateral. CFT <3 seconds. Normal hair growth on digits. No edema.  Skin. No lacerations or abrasions bilateral feet.  Musculoskeletal: MMT 5/5 bilateral lower extremities in DF, PF, Inversion and Eversion. Deceased ROM in DF of ankle joint. Tender to insertion of achilles tednon on the left foot and pain with DF. Mildly tender pain to medial calcaneal tubercle or with calcaneal squeeze.  Neurological: Sensation intact to light touch.   Assessment:   1. Achilles tendinitis, left leg   2. Plantar fasciitis of left foot        Plan:  Patient was evaluated and treated and all questions answered. -Xrays reviewed. No acute fractures or dislocations noted. Spurring noted to posterior heel on the  left.  -Discussed Achilles insertional tendonitis and treatment options with patient.  Continue stretching and lifts as needed.  MRI ordered -PRP discussed and will consider.  -Nitroglycerin  patches provided.  -Discussed plantar fasciitis with patient.  Discussed treatment options including, ice, NSAIDS, supportive shoes, bracing, and stretching.  Patient requesting injection today. Procedure note below.   Follow-up after MRI.   Procedure:  Discussed etiology, pathology, conservative vs. surgical therapies. At this time a plantar fascial injection was recommended.  The patient agreed and a sterile skin prep was applied.  An injection consisting of  1cc dexamethasone  0.5 cc kenalog  and 1cc marcaine  mixture was infiltrated at the point of maximal tenderness on the left Heel.  Bandaid applied. The patient tolerated this well and was given instructions for aftercare.   -Patient to return to office in 2 months for recechk.    Molly Archer, DPM

## 2024-03-11 NOTE — Patient Instructions (Signed)

## 2024-03-17 ENCOUNTER — Other Ambulatory Visit (HOSPITAL_BASED_OUTPATIENT_CLINIC_OR_DEPARTMENT_OTHER): Payer: Self-pay

## 2024-03-17 ENCOUNTER — Other Ambulatory Visit (HOSPITAL_BASED_OUTPATIENT_CLINIC_OR_DEPARTMENT_OTHER): Payer: Self-pay | Admitting: Family Medicine

## 2024-03-18 ENCOUNTER — Other Ambulatory Visit (HOSPITAL_BASED_OUTPATIENT_CLINIC_OR_DEPARTMENT_OTHER): Payer: Self-pay

## 2024-03-20 ENCOUNTER — Encounter: Payer: Self-pay | Admitting: Podiatry

## 2024-03-23 NOTE — Telephone Encounter (Signed)
 Patient is using this for her feet being dry and build up callus

## 2024-03-24 ENCOUNTER — Other Ambulatory Visit (HOSPITAL_BASED_OUTPATIENT_CLINIC_OR_DEPARTMENT_OTHER): Payer: Self-pay

## 2024-03-24 MED ORDER — NAFTIFINE HCL 2 % EX CREA
TOPICAL_CREAM | CUTANEOUS | 1 refills | Status: DC
  Filled 2024-03-24: qty 60, 30d supply, fill #0
  Filled 2024-04-24: qty 60, 30d supply, fill #1

## 2024-03-25 ENCOUNTER — Ambulatory Visit (INDEPENDENT_AMBULATORY_CARE_PROVIDER_SITE_OTHER): Admitting: Licensed Clinical Social Worker

## 2024-03-25 DIAGNOSIS — F4322 Adjustment disorder with anxiety: Secondary | ICD-10-CM

## 2024-03-25 LAB — HM DIABETES EYE EXAM

## 2024-03-25 NOTE — Progress Notes (Unsigned)
 [facility] Counselor/Therapist Progress Note  Patient ID: AMANDAMARIE FEGGINS, MRN: [id number]    Date: 03/25/24  Time Spent: 0400  pm - 0449 pm : 49 Minutes  Treatment Type: Individual Therapy.  Reported Symptoms: anxiety related to social interaction and church relationships and work   Mental Status Exam: Appearance:  Casual     Behavior: Appropriate  Motor: Normal  Speech/Language:  Clear and Coherent  Affect: Appropriate  Mood: normal  Thought process: normal  Thought content:   WNL  Sensory/Perceptual disturbances:   WNL  Orientation: oriented to person, place, time/date, situation, day of week, month of year, and year  Attention: Good  Concentration: Good  Memory: WNL  Fund of knowledge:  Good  Insight:   Good  Judgment:  Good  Impulse Control: Good    Risk Assessment: Danger to Self:  No Self-injurious Behavior: No Danger to Others: No Duty to Warn:no Physical Aggression / Violence:No  Access to Firearms a concern: No  Gang Involvement:No    Subjective:    [name] participated from home, via video, patient was aware of risk and limitations, and consented to treatment. Therapist participated from office. We met online due to patient request.    Celia presented for her session stating that she was tired. She reports that she was at the hospital with her mother till 7 am this morning and still went to work. She states she also reports that she had an eye doctors appointment. Patient reports that she has been stressed at work. Patient reports that she is looking for another job but will stick with this one till she finds something else. She states that she also has to be in a position to consider her mobility. Patient reports that she continues to struggle with anxiety before work. She states that at work when she becomes overwhelmed she has to go outside and just take a breath and walk away from her desk.   Clinician actively listened and  provided encouragement and positive feedback.  Clinician encouraged patient to utilize skills such as deep breathing and mindfulness to cope with her anxiety. Clinician and patient discussed patients concerns about moving to a new job and how her mobility at times can be a barrier. Clinician and patient processed goals for the future and what is required to meet those goals.  Jesslynn was fully involved in discussion with Clinician and evidenced motivation for treatment and understanding via her verbal interaction. Patient is to use CBT, mindfulness and coping skills to help manage decrease symptoms associated with their diagnosis. Treatment planning to be reviewed by 05/15/2024.   Interventions: Cognitive Behavioral Therapy, Assertiveness/Communication, Motivational Interviewing, and Solution-Oriented/Positive Psychology  Diagnosis: Adjustment disorder with anxious mood    Damien Junk MSW, LCSW/DATE 03/25/2024

## 2024-03-26 ENCOUNTER — Inpatient Hospital Stay: Admission: RE | Admit: 2024-03-26 | Source: Ambulatory Visit

## 2024-03-27 ENCOUNTER — Ambulatory Visit
Admission: RE | Admit: 2024-03-27 | Discharge: 2024-03-27 | Disposition: A | Discharge status: Home / Self Care | Source: Ambulatory Visit | Attending: Podiatry | Admitting: Podiatry

## 2024-03-27 DIAGNOSIS — M7662 Achilles tendinitis, left leg: Secondary | ICD-10-CM

## 2024-04-02 ENCOUNTER — Encounter (HOSPITAL_BASED_OUTPATIENT_CLINIC_OR_DEPARTMENT_OTHER): Payer: Self-pay | Admitting: Family Medicine

## 2024-04-02 ENCOUNTER — Other Ambulatory Visit: Payer: Self-pay

## 2024-04-02 ENCOUNTER — Other Ambulatory Visit (HOSPITAL_BASED_OUTPATIENT_CLINIC_OR_DEPARTMENT_OTHER): Payer: Self-pay

## 2024-04-02 ENCOUNTER — Encounter (HOSPITAL_BASED_OUTPATIENT_CLINIC_OR_DEPARTMENT_OTHER): Payer: Self-pay | Admitting: *Deleted

## 2024-04-02 ENCOUNTER — Other Ambulatory Visit: Payer: Self-pay | Admitting: Physical Medicine & Rehabilitation

## 2024-04-02 ENCOUNTER — Ambulatory Visit (HOSPITAL_BASED_OUTPATIENT_CLINIC_OR_DEPARTMENT_OTHER): Admitting: Family Medicine

## 2024-04-02 ENCOUNTER — Other Ambulatory Visit (HOSPITAL_BASED_OUTPATIENT_CLINIC_OR_DEPARTMENT_OTHER): Payer: Self-pay | Admitting: Family Medicine

## 2024-04-02 VITALS — BP 134/77 | HR 77 | Ht 65.0 in | Wt 326.6 lb

## 2024-04-02 DIAGNOSIS — F5101 Primary insomnia: Secondary | ICD-10-CM

## 2024-04-02 DIAGNOSIS — E1165 Type 2 diabetes mellitus with hyperglycemia: Secondary | ICD-10-CM | POA: Diagnosis not present

## 2024-04-02 DIAGNOSIS — M47816 Spondylosis without myelopathy or radiculopathy, lumbar region: Secondary | ICD-10-CM

## 2024-04-02 DIAGNOSIS — Z794 Long term (current) use of insulin: Secondary | ICD-10-CM | POA: Diagnosis not present

## 2024-04-02 DIAGNOSIS — M1711 Unilateral primary osteoarthritis, right knee: Secondary | ICD-10-CM

## 2024-04-02 LAB — POCT GLYCOSYLATED HEMOGLOBIN (HGB A1C)
HbA1c POC (<> result, manual entry): 9.8 % (ref 4.0–5.6)
Hemoglobin A1C: 9.8 % — AB (ref 4.0–5.6)

## 2024-04-02 MED ORDER — CICLOPIROX 1 % EX SHAM
1.0000 | MEDICATED_SHAMPOO | Freq: Every day | CUTANEOUS | 0 refills | Status: DC
  Filled 2024-04-02: qty 120, 30d supply, fill #0

## 2024-04-02 MED ORDER — DICYCLOMINE HCL 20 MG PO TABS
20.0000 mg | ORAL_TABLET | Freq: Three times a day (TID) | ORAL | 1 refills | Status: DC | PRN
  Filled 2024-04-02: qty 30, 10d supply, fill #0

## 2024-04-02 MED ORDER — CLOBETASOL PROPIONATE 0.05 % EX OINT
TOPICAL_OINTMENT | Freq: Two times a day (BID) | CUTANEOUS | 1 refills | Status: DC
  Filled 2024-04-02: qty 30, 15d supply, fill #0
  Filled 2024-04-24: qty 30, 15d supply, fill #1

## 2024-04-02 MED ORDER — TRIAMCINOLONE ACETONIDE 0.1 % EX CREA
1.0000 | TOPICAL_CREAM | Freq: Two times a day (BID) | CUTANEOUS | 0 refills | Status: DC
  Filled 2024-04-02: qty 30, 15d supply, fill #0

## 2024-04-02 MED ORDER — ZOLPIDEM TARTRATE 5 MG PO TABS: 5.0000 mg | ORAL_TABLET | Freq: Every evening | ORAL | 0 refills | Status: DC | PRN

## 2024-04-02 MED ORDER — ZOLPIDEM TARTRATE 5 MG PO TABS
5.0000 mg | ORAL_TABLET | Freq: Every evening | ORAL | 0 refills | Status: DC | PRN
  Filled 2024-04-02: qty 30, 30d supply, fill #0

## 2024-04-02 MED ORDER — TIZANIDINE HCL 4 MG PO TABS
4.0000 mg | ORAL_TABLET | Freq: Three times a day (TID) | ORAL | 0 refills | Status: DC | PRN
  Filled 2024-04-02: qty 30, 10d supply, fill #0

## 2024-04-02 MED ORDER — METOCLOPRAMIDE HCL 10 MG PO TABS
10.0000 mg | ORAL_TABLET | Freq: Three times a day (TID) | ORAL | 1 refills | Status: DC | PRN
  Filled 2024-04-02: qty 30, 10d supply, fill #0
  Filled 2024-04-24: qty 30, 10d supply, fill #1

## 2024-04-02 NOTE — Addendum Note (Signed)
 Addended by: DE PERU, QUINTIN J on: 04/02/2024 08:16 AM   Modules accepted: Orders

## 2024-04-02 NOTE — Progress Notes (Signed)
    Procedures performed today:    None.  Independent interpretation of notes and tests performed by another provider:   None.  Brief History, Exam, Impression, and Recommendations:    BP 134/77 (BP Location: Right Arm, Patient Position: Sitting, Cuff Size: Normal)   Pulse 77   Ht 5' 5 (1.651 m)   Wt (!) 326 lb 9.6 oz (148.1 kg)   SpO2 97%   BMI 54.35 kg/m   Type 2 diabetes mellitus with hyperglycemia, with long-term current use of insulin  [facility]) Assessment & Plan: Patient continues with long-acting insulin .  She has been doing well with insulin .  She has been checking fasting blood sugars each morning and these have been elevated more recently.   She has previously tried to use GLP-1 receptor agonist, however did not tolerate due to GI side effects.  She has also tried metformin, also had to discontinue due to GI side effects.  She was on glipizide  prior to transitioning to insulin , was not having adequate control blood sugars with glipizide  alone. Given elevated fasting blood sugar readings, recommend further titrating dose of insulin  from 12 units up to 14 units.  Recommend continuing to monitor fasting blood sugar readings daily and maintaining a log.  Will plan for close follow-up in about 4 to 6 weeks to assess progress with this. Will check A1c today for monitoring, was elevated at 8.0% about 4-1/2 months ago.   Primary insomnia Assessment & Plan: Patient continues with Ambien , however reports that she feels it is not as beneficial in controlling insomnia.  She finds that it can help her to fall asleep, however she finds that she wakes up about halfway through the night most of the time and cannot fall back asleep.  She is not necessarily aware of what is specifically leading to these nighttime awakenings.  She reports that she has had prior testing for sleep apnea through [facility] which reportedly was normal/did not show evidence of sleep apnea.  We did look to have further  evaluation with provider locally for reassessment, however reports that she ultimately was not able to have this arranged due to prior normal testing. We discussed considerations and she would like to proceed with referral to psychiatry for further evaluation and discussion of alternative medication options.,  Referral placed today  Orders: -     Ambulatory referral to Psychiatry  Patient additionally continues to have some stomach upset, nausea, food intolerance.  She ultimately did have cholecystectomy due to similar symptoms in the past.  She initially was doing fairly well, however has had return of symptoms despite prior cholecystectomy.  She does follow with GI, has not had recent follow-up with general surgery.  She reports that her GI provider is on maternity leave.  She did look to schedule follow-up with GI, however soonest ability that I had was next month. I would recommend for her to have further follow-up with GI office, advised on checking with office to see about any sooner availability that may open.  Return in about 6 weeks (around 05/14/2024) for diabetes.  Spent 32 minutes on this patient encounter, including preparation, chart review, face-to-face counseling with patient and coordination of care, and documentation of encounter   ___________________________________________ Armand Preast de Peru, MD, ABFM, [facility]

## 2024-04-02 NOTE — Assessment & Plan Note (Signed)
 Patient continues with Ambien , however reports that she feels it is not as beneficial in controlling insomnia.  She finds that it can help her to fall asleep, however she finds that she wakes up about halfway through the night most of the time and cannot fall back asleep.  She is not necessarily aware of what is specifically leading to these nighttime awakenings.  She reports that she has had prior testing for sleep apnea through [facility] which reportedly was normal/did not show evidence of sleep apnea.  We did look to have further evaluation with provider locally for reassessment, however reports that she ultimately was not able to have this arranged due to prior normal testing. We discussed considerations and she would like to proceed with referral to psychiatry for further evaluation and discussion of alternative medication options.,  Referral placed today

## 2024-04-02 NOTE — Patient Instructions (Signed)
  Medication Instructions:  Your physician recommends that you continue on your current medications as directed. Please refer to the Current Medication list given to you today. --If you need a refill on any your medications before your next appointment, please call your pharmacy first. If no refills are authorized on file call the office.--   Follow-Up: Your next appointment:   Your physician recommends that you schedule a follow-up appointment in: 4-6 week follow up with Dr. de [place]  You will receive a text message or e-mail with a link to a survey about your care and experience with [place] today! We would greatly appreciate your feedback!   Thanks for letting us be apart of your health journey!!  Primary Care and Sports Medicine   Dr. Ceasar Mons Peru   We encourage you to activate your patient portal called "MyChart".  Sign up information is provided on this After Visit Summary.  MyChart is used to connect with patients for Virtual Visits (Telemedicine).  Patients are able to view lab/test results, encounter notes, upcoming appointments, etc.  Non-urgent messages can be sent to your provider as well. To learn more about what you can do with MyChart, please visit --  [email or web address].

## 2024-04-02 NOTE — Assessment & Plan Note (Signed)
 Patient continues with long-acting insulin .  She has been doing well with insulin .  She has been checking fasting blood sugars each morning and these have been elevated more recently.   She has previously tried to use GLP-1 receptor agonist, however did not tolerate due to GI side effects.  She has also tried metformin, also had to discontinue due to GI side effects.  She was on glipizide  prior to transitioning to insulin , was not having adequate control blood sugars with glipizide  alone. Given elevated fasting blood sugar readings, recommend further titrating dose of insulin  from 12 units up to 14 units.  Recommend continuing to monitor fasting blood sugar readings daily and maintaining a log.  Will plan for close follow-up in about 4 to 6 weeks to assess progress with this. Will check A1c today for monitoring, was elevated at 8.0% about 4-1/2 months ago.

## 2024-04-03 ENCOUNTER — Other Ambulatory Visit (HOSPITAL_BASED_OUTPATIENT_CLINIC_OR_DEPARTMENT_OTHER): Payer: Self-pay

## 2024-04-03 ENCOUNTER — Other Ambulatory Visit: Payer: Self-pay

## 2024-04-03 MED ORDER — OXYCODONE HCL 10 MG PO TABS
10.0000 mg | ORAL_TABLET | Freq: Three times a day (TID) | ORAL | 0 refills | Status: DC | PRN
  Filled 2024-04-03: qty 75, 25d supply, fill #0

## 2024-04-07 ENCOUNTER — Encounter: Payer: Self-pay | Admitting: Podiatry

## 2024-04-08 ENCOUNTER — Other Ambulatory Visit (HOSPITAL_BASED_OUTPATIENT_CLINIC_OR_DEPARTMENT_OTHER): Payer: Self-pay

## 2024-04-08 MED ORDER — FAMOTIDINE 20 MG PO TABS
20.0000 mg | ORAL_TABLET | Freq: Two times a day (BID) | ORAL | 3 refills | Status: AC
  Filled 2024-04-08: qty 180, 90d supply, fill #0

## 2024-04-08 MED ORDER — SUCRALFATE 1 G PO TABS
1.0000 g | ORAL_TABLET | Freq: Four times a day (QID) | ORAL | 1 refills | Status: AC
  Filled 2024-04-08: qty 360, 90d supply, fill #0

## 2024-04-09 ENCOUNTER — Other Ambulatory Visit (HOSPITAL_BASED_OUTPATIENT_CLINIC_OR_DEPARTMENT_OTHER): Payer: Self-pay

## 2024-04-13 ENCOUNTER — Telehealth: Payer: Self-pay | Admitting: Physician Assistant

## 2024-04-13 NOTE — Telephone Encounter (Signed)
 Inbound call from patient stating she is scheduled for an office visit on 05/14/24 but has been experiencing some constipation or if not constipation its nonstop diarrhea and nausea. Patient states she can not keep anything down and even medication comes back up. Wants to know if we have a sooner availability. Requesting a call back Please advise  Thank you

## 2024-04-13 NOTE — Telephone Encounter (Signed)
 Pt scheduled to see Harlene Mail PA 9/16@1 :50pm. Discussed with pt that if she cannot keep liquids or her medications down she would need to go to the ER.

## 2024-04-14 ENCOUNTER — Encounter: Payer: Self-pay | Admitting: Gastroenterology

## 2024-04-14 ENCOUNTER — Other Ambulatory Visit: Payer: Self-pay

## 2024-04-14 ENCOUNTER — Ambulatory Visit: Admitting: Gastroenterology

## 2024-04-14 ENCOUNTER — Other Ambulatory Visit (HOSPITAL_BASED_OUTPATIENT_CLINIC_OR_DEPARTMENT_OTHER): Payer: Self-pay

## 2024-04-14 VITALS — BP 128/74 | HR 92 | Ht 65.0 in | Wt 327.4 lb

## 2024-04-14 DIAGNOSIS — K219 Gastro-esophageal reflux disease without esophagitis: Secondary | ICD-10-CM | POA: Diagnosis not present

## 2024-04-14 DIAGNOSIS — R112 Nausea with vomiting, unspecified: Secondary | ICD-10-CM | POA: Diagnosis not present

## 2024-04-14 DIAGNOSIS — R1310 Dysphagia, unspecified: Secondary | ICD-10-CM

## 2024-04-14 DIAGNOSIS — R194 Change in bowel habit: Secondary | ICD-10-CM | POA: Diagnosis not present

## 2024-04-14 MED ORDER — NA SULFATE-K SULFATE-MG SULF 17.5-3.13-1.6 GM/177ML PO SOLN
1.0000 | Freq: Once | ORAL | 0 refills | Status: AC
  Filled 2024-04-14: qty 354, 1d supply, fill #0

## 2024-04-14 MED ORDER — DICYCLOMINE HCL 10 MG PO CAPS
10.0000 mg | ORAL_CAPSULE | Freq: Two times a day (BID) | ORAL | 1 refills | Status: AC | PRN
  Filled 2024-04-14: qty 60, 30d supply, fill #0

## 2024-04-14 NOTE — H&P (View-Only) (Signed)
 04/14/2024 Molly Archer [id number] May 14, 1981   HISTORY OF PRESENT ILLNESS: This is a 43 year old female who is a patient of Dr. Nancyann.  She has been seen by Nestor Blower, PA-C, on a couple of occasions.  She has several GI complaints.  She complains of ongoing issues with nausea.  Says that she has nausea 24/7.  She takes metoclopramide  regularly, which does help to a great degree, but then it wears off.  She says that she feels like when she swallows that food and even sometimes liquid gets trapped and then comes back up.  She is on pantoprazole  40 mg daily as well and recently visited urgent care where they gave her famotidine  20 mg twice daily and Carafate  tablets to take 4 times daily.  She also had reported chronic constipation.  She says that she will have the constipation, but then sometimes she will have 8 bowel movements back-to-back.  She says that they can be normal stools.  She has never had endoscopic evaluation.  She was on Mounjaro , but has not been on that since February.  She had a cholecystectomy back in early June for gallbladder dysfunction with a HIDA scan showing 2% function.  She says that the right upper quadrant abdominal pain that she was having resolved, but all of her other symptoms have been persistent.  She has been using the dicyclomine  20 mg twice daily every day, but feels like that it causes her some blurry vision.  Current GI medications pantoprazole  40 mg daily, famotidine  20 mg twice daily, Carafate  tablet 4 times daily, metoclopramide  10 mg regularly every 8 hours around-the-clock, dicyclomine  20 mg twice daily every day.  CT scan abdomen and pelvis as well as chest in July 2025:  IMPRESSION: 1. Tiny small, subcentimeter locule of gas along the medial right middle lobe (axial 39), possibly a small residual amount of pleural gas from the prior pneumothorax. 2. Trace left pleural effusion with left basilar atelectasis. 3. Subpleural right upper  lobe nodular opacity measuring 3 mm (axial 41). Follow-up could be considered, as documented below. 4. Cholecystectomy.  No intra-abdominal abscess or fluid collection. 5. Scattered colonic diverticulosis.   Past Medical History:  Diagnosis Date   Allergy    Anemia    pt states she took iron for this around 2019, and has had no issues since   Arthritis    knees   Asthma    only uses inhaler in fall and spring   Constipation    Diabetes mellitus without complication (HCC)    GERD (gastroesophageal reflux disease)    diet controlled, occ. uses omeprazole    Headache    otc med prn   Heart murmur    History of kidney stones    Hx of Bell's palsy 12/2011   Hypertension    Menometrorrhagia    Psoriasis    Shortness of breath dyspnea    with exercise/exertion   Vitamin D  deficiency    Past Surgical History:  Procedure Laterality Date   CHOLECYSTECTOMY N/A 01/01/2024   Procedure: LAPAROSCOPIC CHOLECYSTECTOMY;  Surgeon: Tanda Locus, MD;  Location: [facility] OR;  Service: General;  Laterality: N/A;   COMBINED HYSTEROSCOPY DIAGNOSTIC / D&C N/A 07/30/2009   Benign secretory endometrium   CYSTOSCOPY WITH STENT PLACEMENT Left 10/19/2014   Procedure: CYSTOSCOPY WITH STENT PLACEMENT;  Surgeon: Morene LELON Salines, MD;  Location: [facility];  Service: Urology;  Laterality: Left;   DILATION AND CURETTAGE OF UTERUS  10/29/2003   Polyp, mild  atypia, simple and complex hyperplasia,   DILATION AND CURETTAGE OF UTERUS  11/28/2003   Simple and complex hyperplasia   DILATION AND CURETTAGE OF UTERUS  07/30/2009   DILATION AND CURETTAGE OF UTERUS N/A 12/14/2014   Procedure: DILATATION AND CURETTAGE;  Surgeon: Winton Felt, MD;  Location: [facility];  Service: Gynecology;  Laterality: N/A;   HOLMIUM LASER APPLICATION Left 10/19/2014   Procedure: HOLMIUM LASER APPLICATION;  Surgeon: Morene LELON Salines, MD;  Location: [facility];  Service: Urology;  Laterality: Left;   HYSTEROSCOPY WITH D & C N/A 07/21/2018    Procedure: DILATATION AND CURETTAGE /HYSTEROSCOPY;  Surgeon: Eveline Lynwood MATSU, MD;  Location: [facility];  Service: Gynecology;  Laterality: N/A;   INTRAUTERINE DEVICE (IUD) INSERTION N/A 12/14/2014   Procedure: INTRAUTERINE DEVICE (IUD) INSERTION;  Surgeon: Winton Felt, MD;  Location: [facility];  Service: Gynecology;  Laterality: N/A;   KNEE CARTILAGE SURGERY Left    STONE EXTRACTION WITH BASKET Left 10/19/2014   Procedure: STONE EXTRACTION WITH BASKET;  Surgeon: Morene LELON Salines, MD;  Location: [facility];  Service: Urology;  Laterality: Left;   WISDOM TOOTH EXTRACTION      reports that she has never smoked. She has never been exposed to tobacco smoke. She has never used smokeless tobacco. She reports that she does not drink alcohol and does not use drugs. family history includes Cancer in her maternal aunt, maternal grandfather, and another family member; Diabetes in her father, maternal aunt, maternal uncle, mother, and another family member; Hyperlipidemia in her mother; Hypertension in her father, mother, and another family member; Stroke in her maternal grandmother. Allergies  Allergen Reactions   Codeine Hives   Doxycycline  Nausea Only    Pt states causes nausea and vomiting.   Losartan Other (See Comments)    headache   Penicillins Hives   Tramadol  Hives      Outpatient Encounter Medications as of 04/14/2024  Medication Sig   Accu-Chek Softclix Lancets lancets by Other route. Use as instructed   albuterol  (VENTOLIN  HFA) 108 (90 Base) MCG/ACT inhaler Inhale 2 puffs into the lungs every 4 (four) hours as needed for wheezing or shortness of breath (cough, shortness of breath or wheezing.).   Blood Glucose Monitoring Suppl (BLOOD GLUCOSE MONITOR SYSTEM) w/Device KIT Use as directed in the morning, at noon, and at bedtime.   Ciclopirox  1 % shampoo Apply 1 each topically at bedtime.   clobetasol  ointment (TEMOVATE ) 0.05 % Apply topically 2 (two) times daily.   diclofenac  (VOLTAREN ) 75 MG EC  tablet Take 1 tablet (75 mg total) by mouth 2 (two) times daily with a meal. (Patient taking differently: Take 75 mg by mouth as needed.)   dicyclomine  (BENTYL ) 20 MG tablet Take 1 tablet (20 mg total) by mouth 3 (three) times daily as needed for spasms.   doxazosin  (CARDURA ) 1 MG tablet Take 1 tablet (1 mg total) by mouth daily.   famotidine  (PEPCID ) 20 MG tablet Take 1 tablet (20 mg total) by mouth 2 (two) times daily.   Fluocinolone  Acetonide Scalp 0.01 % OIL Apply topically.   Glucose Blood (BLOOD GLUCOSE TEST STRIPS 333 VI) 100 each by In Vitro route 2 (two) times daily before a meal.   hydrochlorothiazide  (MICROZIDE ) 12.5 MG capsule Take 1 capsule (12.5 mg total) by mouth daily.   insulin  glargine-yfgn (SEMGLEE ) 100 UNIT/ML Pen Inject 12 Units into the skin daily.   Insulin  Pen Needle (PEN NEEDLES) 32G X 4 MM MISC Use as directed for injecting insulin   irbesartan -hydrochlorothiazide  (AVALIDE) 300-12.5 MG tablet Take 1 tablet by mouth daily.   lidocaine  (XYLOCAINE ) 2 % solution Use as directed 15 mLs in the mouth or throat every 4 (four) hours as needed.   metoCLOPramide  (REGLAN ) 10 MG tablet Take 1 tablet (10 mg total) by mouth every 8 (eight) hours as needed for Nausea   misoprostol (CYTOTEC) 200 MCG tablet SMARTSIG:1 Tablet(s) By Mouth   Naftifine  HCl 2 % CREA Apply liberally to affected area daily   Naphazoline-Pheniramine (ALLERGY EYE OP) Place 1 drop into both eyes daily as needed (allergies).   nitroGLYCERIN  (NITRODUR - DOSED IN MG/24 HR) 0.1 mg/hr patch Place 1 patch (0.1 mg total) onto the skin daily.   norethindrone  (AYGESTIN ) 5 MG tablet Take 3 tablets (15 mg total) by mouth daily.   Oxycodone  HCl 10 MG TABS Take 1 tablet (10 mg total) by mouth every 8 (eight) hours as needed.   pantoprazole  (PROTONIX ) 40 MG tablet Take 1 tablet (40 mg total) by mouth daily.   sucralfate  (CARAFATE ) 1 g tablet Take 1 tablet (1 g total) by mouth 4 (four) times daily.   tiZANidine  (ZANAFLEX ) 4 MG  tablet Take 1 tablet (4 mg total) by mouth every 8 (eight) hours as needed for muscle spasms.   triamcinolone  cream (KENALOG ) 0.1 % Apply 1 Application topically 2 (two) times daily.   zolpidem  (AMBIEN ) 5 MG tablet Take 1 tablet (5 mg total) by mouth at bedtime as needed for sleep.   [DISCONTINUED] methocarbamol  (ROBAXIN ) 500 MG tablet Take 1 tablet (500 mg total) by mouth 4 (four) times daily.   [DISCONTINUED] NIFEdipine  (PROCARDIA  XL/NIFEDICAL XL) 60 MG 24 hr tablet Take 1 tablet (60 mg total) by mouth daily.   No facility-administered encounter medications on file as of 04/14/2024.     REVIEW OF SYSTEMS  : All other systems reviewed and negative except where noted in the History of Present Illness.   PHYSICAL EXAM: BP (!) 144/70   Pulse 92   Ht 5' 5 (1.651 m)   Wt (!) 327 lb 6.4 oz (148.5 kg)   BMI 54.48 kg/m  General: Well developed AA female in no acute distress Head: Normocephalic and atraumatic Eyes:  Sclerae anicteric, conjunctiva pink. Ears: Normal auditory acuity Lungs: Clear throughout to auscultation; no W/R/R. Heart: Regular rate and rhythm; no M/R/G. Abdomen: Soft, non-distended.  BS present.  Upper abdominal TTP. Rectal:  Will be done at the time of colonoscopy. Musculoskeletal: Symmetrical with no gross deformities  Skin: No lesions on visible extremities Extremities: No edema  Neurological: Alert oriented x 4, grossly non-focal Psychological:  Alert and cooperative. Normal mood and affect  ASSESSMENT AND PLAN: *Epigastric abdominal pain, nausea, dysphagia, GERD: Symptoms ongoing despite pantoprazole  40 mg daily.  Recently urgent care gave her famotidine  20 mg twice daily and Carafate  tablet 4 times daily.  -Continue current regimen.  Can dissolve carafate  tablet in small amount of water to make a slurry/suspension. -Will plan for EGD at [facility] with Dr. Abran.  *Altered bowel habits:  Reports constipation but then also has days where she has several  frequent bowel movements.  -Will plan for colonoscopy as well for completeness. - Advised that she should be using MiraLAX  daily to keep her stools soft in light of her opioid use, but may also consider adding Benefiber starting with 2 teaspoons mixed in 8 ounces of liquid daily to try to help bulk the stools. - Reports using dicyclomine  20 mg twice daily every day, but says that  it causes her vision to be blurry.  Will reduce the dose to 10 mg and advised to use only twice daily as needed.  New prescription sent to pharmacy.  Will see how she does with this.  *DM  *Morbid obesity with BMI > 50   CC:  [place], Quintin PARAS, MD

## 2024-04-14 NOTE — Progress Notes (Signed)
 04/14/2024 Molly Archer [id number] May 14, 1981   HISTORY OF PRESENT ILLNESS: This is a 43 year old female who is a patient of Dr. Nancyann.  She has been seen by Nestor Blower, PA-C, on a couple of occasions.  She has several GI complaints.  She complains of ongoing issues with nausea.  Says that she has nausea 24/7.  She takes metoclopramide  regularly, which does help to a great degree, but then it wears off.  She says that she feels like when she swallows that food and even sometimes liquid gets trapped and then comes back up.  She is on pantoprazole  40 mg daily as well and recently visited urgent care where they gave her famotidine  20 mg twice daily and Carafate  tablets to take 4 times daily.  She also had reported chronic constipation.  She says that she will have the constipation, but then sometimes she will have 8 bowel movements back-to-back.  She says that they can be normal stools.  She has never had endoscopic evaluation.  She was on Mounjaro , but has not been on that since February.  She had a cholecystectomy back in early June for gallbladder dysfunction with a HIDA scan showing 2% function.  She says that the right upper quadrant abdominal pain that she was having resolved, but all of her other symptoms have been persistent.  She has been using the dicyclomine  20 mg twice daily every day, but feels like that it causes her some blurry vision.  Current GI medications pantoprazole  40 mg daily, famotidine  20 mg twice daily, Carafate  tablet 4 times daily, metoclopramide  10 mg regularly every 8 hours around-the-clock, dicyclomine  20 mg twice daily every day.  CT scan abdomen and pelvis as well as chest in July 2025:  IMPRESSION: 1. Tiny small, subcentimeter locule of gas along the medial right middle lobe (axial 39), possibly a small residual amount of pleural gas from the prior pneumothorax. 2. Trace left pleural effusion with left basilar atelectasis. 3. Subpleural right upper  lobe nodular opacity measuring 3 mm (axial 41). Follow-up could be considered, as documented below. 4. Cholecystectomy.  No intra-abdominal abscess or fluid collection. 5. Scattered colonic diverticulosis.   Past Medical History:  Diagnosis Date   Allergy    Anemia    pt states she took iron for this around 2019, and has had no issues since   Arthritis    knees   Asthma    only uses inhaler in fall and spring   Constipation    Diabetes mellitus without complication (HCC)    GERD (gastroesophageal reflux disease)    diet controlled, occ. uses omeprazole    Headache    otc med prn   Heart murmur    History of kidney stones    Hx of Bell's palsy 12/2011   Hypertension    Menometrorrhagia    Psoriasis    Shortness of breath dyspnea    with exercise/exertion   Vitamin D  deficiency    Past Surgical History:  Procedure Laterality Date   CHOLECYSTECTOMY N/A 01/01/2024   Procedure: LAPAROSCOPIC CHOLECYSTECTOMY;  Surgeon: Tanda Locus, MD;  Location: [facility] OR;  Service: General;  Laterality: N/A;   COMBINED HYSTEROSCOPY DIAGNOSTIC / D&C N/A 07/30/2009   Benign secretory endometrium   CYSTOSCOPY WITH STENT PLACEMENT Left 10/19/2014   Procedure: CYSTOSCOPY WITH STENT PLACEMENT;  Surgeon: Morene LELON Salines, MD;  Location: [facility];  Service: Urology;  Laterality: Left;   DILATION AND CURETTAGE OF UTERUS  10/29/2003   Polyp, mild  atypia, simple and complex hyperplasia,   DILATION AND CURETTAGE OF UTERUS  11/28/2003   Simple and complex hyperplasia   DILATION AND CURETTAGE OF UTERUS  07/30/2009   DILATION AND CURETTAGE OF UTERUS N/A 12/14/2014   Procedure: DILATATION AND CURETTAGE;  Surgeon: Winton Felt, MD;  Location: [facility];  Service: Gynecology;  Laterality: N/A;   HOLMIUM LASER APPLICATION Left 10/19/2014   Procedure: HOLMIUM LASER APPLICATION;  Surgeon: Morene LELON Salines, MD;  Location: [facility];  Service: Urology;  Laterality: Left;   HYSTEROSCOPY WITH D & C N/A 07/21/2018    Procedure: DILATATION AND CURETTAGE /HYSTEROSCOPY;  Surgeon: Eveline Lynwood MATSU, MD;  Location: [facility];  Service: Gynecology;  Laterality: N/A;   INTRAUTERINE DEVICE (IUD) INSERTION N/A 12/14/2014   Procedure: INTRAUTERINE DEVICE (IUD) INSERTION;  Surgeon: Winton Felt, MD;  Location: [facility];  Service: Gynecology;  Laterality: N/A;   KNEE CARTILAGE SURGERY Left    STONE EXTRACTION WITH BASKET Left 10/19/2014   Procedure: STONE EXTRACTION WITH BASKET;  Surgeon: Morene LELON Salines, MD;  Location: [facility];  Service: Urology;  Laterality: Left;   WISDOM TOOTH EXTRACTION      reports that she has never smoked. She has never been exposed to tobacco smoke. She has never used smokeless tobacco. She reports that she does not drink alcohol and does not use drugs. family history includes Cancer in her maternal aunt, maternal grandfather, and another family member; Diabetes in her father, maternal aunt, maternal uncle, mother, and another family member; Hyperlipidemia in her mother; Hypertension in her father, mother, and another family member; Stroke in her maternal grandmother. Allergies  Allergen Reactions   Codeine Hives   Doxycycline  Nausea Only    Pt states causes nausea and vomiting.   Losartan Other (See Comments)    headache   Penicillins Hives   Tramadol  Hives      Outpatient Encounter Medications as of 04/14/2024  Medication Sig   Accu-Chek Softclix Lancets lancets by Other route. Use as instructed   albuterol  (VENTOLIN  HFA) 108 (90 Base) MCG/ACT inhaler Inhale 2 puffs into the lungs every 4 (four) hours as needed for wheezing or shortness of breath (cough, shortness of breath or wheezing.).   Blood Glucose Monitoring Suppl (BLOOD GLUCOSE MONITOR SYSTEM) w/Device KIT Use as directed in the morning, at noon, and at bedtime.   Ciclopirox  1 % shampoo Apply 1 each topically at bedtime.   clobetasol  ointment (TEMOVATE ) 0.05 % Apply topically 2 (two) times daily.   diclofenac  (VOLTAREN ) 75 MG EC  tablet Take 1 tablet (75 mg total) by mouth 2 (two) times daily with a meal. (Patient taking differently: Take 75 mg by mouth as needed.)   dicyclomine  (BENTYL ) 20 MG tablet Take 1 tablet (20 mg total) by mouth 3 (three) times daily as needed for spasms.   doxazosin  (CARDURA ) 1 MG tablet Take 1 tablet (1 mg total) by mouth daily.   famotidine  (PEPCID ) 20 MG tablet Take 1 tablet (20 mg total) by mouth 2 (two) times daily.   Fluocinolone  Acetonide Scalp 0.01 % OIL Apply topically.   Glucose Blood (BLOOD GLUCOSE TEST STRIPS 333 VI) 100 each by In Vitro route 2 (two) times daily before a meal.   hydrochlorothiazide  (MICROZIDE ) 12.5 MG capsule Take 1 capsule (12.5 mg total) by mouth daily.   insulin  glargine-yfgn (SEMGLEE ) 100 UNIT/ML Pen Inject 12 Units into the skin daily.   Insulin  Pen Needle (PEN NEEDLES) 32G X 4 MM MISC Use as directed for injecting insulin   irbesartan -hydrochlorothiazide  (AVALIDE) 300-12.5 MG tablet Take 1 tablet by mouth daily.   lidocaine  (XYLOCAINE ) 2 % solution Use as directed 15 mLs in the mouth or throat every 4 (four) hours as needed.   metoCLOPramide  (REGLAN ) 10 MG tablet Take 1 tablet (10 mg total) by mouth every 8 (eight) hours as needed for Nausea   misoprostol (CYTOTEC) 200 MCG tablet SMARTSIG:1 Tablet(s) By Mouth   Naftifine  HCl 2 % CREA Apply liberally to affected area daily   Naphazoline-Pheniramine (ALLERGY EYE OP) Place 1 drop into both eyes daily as needed (allergies).   nitroGLYCERIN  (NITRODUR - DOSED IN MG/24 HR) 0.1 mg/hr patch Place 1 patch (0.1 mg total) onto the skin daily.   norethindrone  (AYGESTIN ) 5 MG tablet Take 3 tablets (15 mg total) by mouth daily.   Oxycodone  HCl 10 MG TABS Take 1 tablet (10 mg total) by mouth every 8 (eight) hours as needed.   pantoprazole  (PROTONIX ) 40 MG tablet Take 1 tablet (40 mg total) by mouth daily.   sucralfate  (CARAFATE ) 1 g tablet Take 1 tablet (1 g total) by mouth 4 (four) times daily.   tiZANidine  (ZANAFLEX ) 4 MG  tablet Take 1 tablet (4 mg total) by mouth every 8 (eight) hours as needed for muscle spasms.   triamcinolone  cream (KENALOG ) 0.1 % Apply 1 Application topically 2 (two) times daily.   zolpidem  (AMBIEN ) 5 MG tablet Take 1 tablet (5 mg total) by mouth at bedtime as needed for sleep.   [DISCONTINUED] methocarbamol  (ROBAXIN ) 500 MG tablet Take 1 tablet (500 mg total) by mouth 4 (four) times daily.   [DISCONTINUED] NIFEdipine  (PROCARDIA  XL/NIFEDICAL XL) 60 MG 24 hr tablet Take 1 tablet (60 mg total) by mouth daily.   No facility-administered encounter medications on file as of 04/14/2024.     REVIEW OF SYSTEMS  : All other systems reviewed and negative except where noted in the History of Present Illness.   PHYSICAL EXAM: BP (!) 144/70   Pulse 92   Ht 5' 5 (1.651 m)   Wt (!) 327 lb 6.4 oz (148.5 kg)   BMI 54.48 kg/m  General: Well developed AA female in no acute distress Head: Normocephalic and atraumatic Eyes:  Sclerae anicteric, conjunctiva pink. Ears: Normal auditory acuity Lungs: Clear throughout to auscultation; no W/R/R. Heart: Regular rate and rhythm; no M/R/G. Abdomen: Soft, non-distended.  BS present.  Upper abdominal TTP. Rectal:  Will be done at the time of colonoscopy. Musculoskeletal: Symmetrical with no gross deformities  Skin: No lesions on visible extremities Extremities: No edema  Neurological: Alert oriented x 4, grossly non-focal Psychological:  Alert and cooperative. Normal mood and affect  ASSESSMENT AND PLAN: *Epigastric abdominal pain, nausea, dysphagia, GERD: Symptoms ongoing despite pantoprazole  40 mg daily.  Recently urgent care gave her famotidine  20 mg twice daily and Carafate  tablet 4 times daily.  -Continue current regimen.  Can dissolve carafate  tablet in small amount of water to make a slurry/suspension. -Will plan for EGD at [facility] with Dr. Abran.  *Altered bowel habits:  Reports constipation but then also has days where she has several  frequent bowel movements.  -Will plan for colonoscopy as well for completeness. - Advised that she should be using MiraLAX  daily to keep her stools soft in light of her opioid use, but may also consider adding Benefiber starting with 2 teaspoons mixed in 8 ounces of liquid daily to try to help bulk the stools. - Reports using dicyclomine  20 mg twice daily every day, but says that  it causes her vision to be blurry.  Will reduce the dose to 10 mg and advised to use only twice daily as needed.  New prescription sent to pharmacy.  Will see how she does with this.  *DM  *Morbid obesity with BMI > 50   CC:  [place], Quintin PARAS, MD

## 2024-04-14 NOTE — Progress Notes (Signed)
 Noted

## 2024-04-14 NOTE — Patient Instructions (Addendum)
 We have sent the following medications to your pharmacy for you to pick up at your convenience: Dicyclomine  10 mg twice daily as needed.  Start Miralax  1 capful daily in 8 ounces of liquid.  Start Benefiber 2 teaspoons in 8 ounces of liquid daily.   You have been scheduled for an endoscopy and colonoscopy. Please follow the written instructions given to you at your visit today.  If you use inhalers (even only as needed), please bring them with you on the day of your procedure.  DO NOT TAKE 7 DAYS PRIOR TO TEST- Trulicity (dulaglutide) Ozempic, Wegovy (semaglutide) Mounjaro  (tirzepatide ) Bydureon Bcise (exanatide extended release)  DO NOT TAKE 1 DAY PRIOR TO YOUR TEST Rybelsus (semaglutide) Adlyxin (lixisenatide) Victoza (liraglutide) Byetta (exanatide) ______________________________________________________________________

## 2024-04-15 ENCOUNTER — Ambulatory Visit: Admitting: Licensed Clinical Social Worker

## 2024-04-15 DIAGNOSIS — F4322 Adjustment disorder with anxiety: Secondary | ICD-10-CM | POA: Diagnosis not present

## 2024-04-15 NOTE — Progress Notes (Unsigned)
 [facility] Counselor/Therapist Progress Note  Patient ID: Molly Archer, MRN: [id number]    Date: 04/15/24  Time Spent: 0401  pm - 0454 pm : 53 Minutes  Treatment Type: Individual Therapy.  Reported Symptoms: anxiety related to social interaction and church relationships and work   Mental Status Exam: Appearance:  Casual     Behavior: Appropriate  Motor: Normal  Speech/Language:  Clear and Coherent  Affect: Appropriate  Mood: normal  Thought process: normal  Thought content:   WNL  Sensory/Perceptual disturbances:   WNL  Orientation: oriented to person, place, time/date, situation, day of week, month of year, and year  Attention: Good  Concentration: Good  Memory: WNL  Fund of knowledge:  Good  Insight:   Good  Judgment:  Good  Impulse Control: Good    Risk Assessment: Danger to Self:  No Self-injurious Behavior: No Danger to Others: No Duty to Warn:no Physical Aggression / Violence:No  Access to Firearms a concern: No  Gang Involvement:No    Subjective:    Molly Archer participated from home, via video, patient was aware of risk and limitations, and consented to treatment. Therapist participated from office. We met online due to patient request.   Molly Archer presented for her session stating she is exhausted. Patient reports that she has been overwhelmed at work and has had stress due to a CPS case she has had to consider filing. Patient reports that she has actively been seeking another job but hasn't found anything as yet. She states that she just feels that this job is not where she needs to be for the rest of her life due to the stress that she has been having. Patient reports additional health problems that have yet to be resolved and identified that she is concerned that they are stemming from stress.  Clinician actively listened and provided support via active engagement and verbal feedback. Clinician processed with patient the negative  effects of stress on the body such as: Increased heart rate and blood pressure, Irregular heartbeat, and Increased risk of heart disease and stroke.  Gastrointestinal System:  Nausea, vomiting, and diarrhea Ulcers and gastritis Irritable bowel syndrome (IBS) Clinician and patient processed these and discussed how she has been feeling and the symptoms she has been having. Clinician encouraged patient to have a discussion with her PCP and have the test completed that he has requested and then see where she stands.  Molly Archer was fully engaged in session and was polite and cooperative. Molly Archer is motivated for treatment as evidenced by her active involvement in session. Molly Archer will continue to engage in CBT, mindfulness and coping skills to help manage decrease symptoms associated with their diagnosis.  Reduce overall level, frequency, and intensity of the feelings of depression and anxiety evidenced by       decreased  negative self talk, and helpless feelings from 6 to 7 days/week to 0 to 1 days/week per client report for at least 3 consecutive months. Treatment plan to be reviewed by 05/15/2024.    Interventions: Cognitive Behavioral Therapy, Dialectical Behavioral Therapy, Mindfulness Meditation, Motivational Interviewing, and Solution-Oriented/Positive Psychology  Diagnosis: Adjustment Disorder with Anxious mood    Damien Junk MSW, LCSW/DATE 04/15/2024

## 2024-04-16 ENCOUNTER — Telehealth: Payer: Self-pay

## 2024-04-16 NOTE — Telephone Encounter (Signed)
 Patient called and left a message - she is asking for her MRI results - she is scheduled for follow up on 05/06/24 - Is there anything we can recommend in the meantime for her worsening pain?

## 2024-04-17 ENCOUNTER — Other Ambulatory Visit (HOSPITAL_BASED_OUTPATIENT_CLINIC_OR_DEPARTMENT_OTHER): Payer: Self-pay

## 2024-04-17 ENCOUNTER — Other Ambulatory Visit: Payer: Self-pay | Admitting: Podiatry

## 2024-04-17 MED ORDER — METHYLPREDNISOLONE 4 MG PO TBPK
ORAL_TABLET | ORAL | 0 refills | Status: DC
  Filled 2024-04-17: qty 21, 6d supply, fill #0

## 2024-04-18 ENCOUNTER — Other Ambulatory Visit (HOSPITAL_BASED_OUTPATIENT_CLINIC_OR_DEPARTMENT_OTHER): Payer: Self-pay

## 2024-04-20 ENCOUNTER — Telehealth: Payer: Self-pay | Admitting: Lab

## 2024-04-20 NOTE — Telephone Encounter (Signed)
 Patient is calling with concerns about treatment was called in medication to pharmacy that had been used in the past with no relief to her states physical therapy isn't helping nor exercises. Has schedule appointment coming up that will be trying to make but has other appointments that day. Is in need of recommendations at this time for some pain relief.

## 2024-04-22 ENCOUNTER — Ambulatory Visit (HOSPITAL_BASED_OUTPATIENT_CLINIC_OR_DEPARTMENT_OTHER): Admitting: Orthopaedic Surgery

## 2024-04-22 ENCOUNTER — Other Ambulatory Visit (HOSPITAL_BASED_OUTPATIENT_CLINIC_OR_DEPARTMENT_OTHER): Payer: Self-pay

## 2024-04-22 DIAGNOSIS — M533 Sacrococcygeal disorders, not elsewhere classified: Secondary | ICD-10-CM | POA: Diagnosis not present

## 2024-04-22 DIAGNOSIS — M25551 Pain in right hip: Secondary | ICD-10-CM | POA: Diagnosis not present

## 2024-04-22 DIAGNOSIS — G8929 Other chronic pain: Secondary | ICD-10-CM

## 2024-04-22 DIAGNOSIS — M7918 Myalgia, other site: Secondary | ICD-10-CM | POA: Diagnosis not present

## 2024-04-22 MED ORDER — TRIAMCINOLONE ACETONIDE 40 MG/ML IJ SUSP
80.0000 mg | INTRAMUSCULAR | Status: AC | PRN
  Administered 2024-04-22: 80 mg via INTRA_ARTICULAR

## 2024-04-22 MED ORDER — LIDOCAINE HCL 1 % IJ SOLN
4.0000 mL | INTRAMUSCULAR | Status: AC | PRN
  Administered 2024-04-22: 4 mL
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
## 2024-04-24 ENCOUNTER — Other Ambulatory Visit (HOSPITAL_BASED_OUTPATIENT_CLINIC_OR_DEPARTMENT_OTHER): Payer: Self-pay | Admitting: Family Medicine

## 2024-04-24 ENCOUNTER — Other Ambulatory Visit (HOSPITAL_BASED_OUTPATIENT_CLINIC_OR_DEPARTMENT_OTHER): Payer: Self-pay

## 2024-04-24 ENCOUNTER — Other Ambulatory Visit: Payer: Self-pay

## 2024-04-24 MED ORDER — DOXAZOSIN MESYLATE 1 MG PO TABS
1.0000 mg | ORAL_TABLET | Freq: Every day | ORAL | 3 refills | Status: AC
  Filled 2024-04-24: qty 30, 30d supply, fill #0
  Filled 2024-06-09: qty 30, 30d supply, fill #1
  Filled 2024-07-10: qty 30, 30d supply, fill #2
  Filled 2024-08-16: qty 30, 30d supply, fill #3

## 2024-04-24 MED ORDER — TRIAMCINOLONE ACETONIDE 0.1 % EX CREA
1.0000 | TOPICAL_CREAM | Freq: Two times a day (BID) | CUTANEOUS | 0 refills | Status: DC
  Filled 2024-04-24: qty 30, 30d supply, fill #0

## 2024-04-27 ENCOUNTER — Other Ambulatory Visit: Payer: Self-pay

## 2024-04-29 ENCOUNTER — Encounter (HOSPITAL_COMMUNITY): Payer: Self-pay | Admitting: Internal Medicine

## 2024-05-04 ENCOUNTER — Telehealth: Payer: Self-pay

## 2024-05-04 NOTE — Telephone Encounter (Signed)
 Procedure:EGD Procedure date: 05/06/24 Procedure location: [facility] Arrival Time: 7:45 Spoke with the patient Y/N: Y Any prep concerns? N  Has the patient obtained the prep from the pharmacy ? N Do you have a care partner and transportation: Y Any additional concerns? N

## 2024-05-05 NOTE — Anesthesia Preprocedure Evaluation (Signed)
 Anesthesia Evaluation  Patient identified by MRN, date of birth, ID band Patient awake    Reviewed: Allergy & Precautions, NPO status , Patient's Chart, lab work & pertinent test results  History of Anesthesia Complications Negative for: history of anesthetic complications  Airway Mallampati: III  TM Distance: >3 FB Neck ROM: Full   Comment: Has required Glidescope for intubation Dental  (+) Dental Advisory Given   Pulmonary neg shortness of breath, asthma , neg sleep apnea, neg COPD, neg recent URI H/o pneumothorax   Pulmonary exam normal breath sounds clear to auscultation       Cardiovascular hypertension (HCTZ, irbesartan -HCTZ), Pt. on medications (-) angina (-) Past MI, (-) Cardiac Stents and (-) CABG (-) dysrhythmias + Valvular Problems/Murmurs  Rhythm:Regular Rate:Normal     Neuro/Psych  Headaches, neg Seizures PSYCHIATRIC DISORDERS      H/o Bell's palsy 2013  Neuromuscular disease (lumbar radiculopathy, cervical spondylosis)    GI/Hepatic Neg liver ROS, Bowel prep,GERD  Medicated,,  Endo/Other  diabetes (Hgb A1c 9.8), Poorly Controlled, Type 2, Insulin  Dependent  Class 4 obesity  Renal/GU negative Renal ROS     Musculoskeletal  (+) Arthritis , Osteoarthritis,    Abdominal  (+) + obese  Peds  Hematology negative hematology ROS (+) Lab Results      Component                Value               Date                      WBC                      8.7                 01/13/2024                HGB                      12.8                01/13/2024                HCT                      40.8                01/13/2024                MCV                      90                  01/13/2024                PLT                      501 (H)             01/13/2024              Anesthesia Other Findings   Reproductive/Obstetrics                              Anesthesia Physical Anesthesia  Plan  ASA: 3  Anesthesia Plan: MAC   Post-op Pain Management: Minimal or no  pain anticipated   Induction: Intravenous  PONV Risk Score and Plan: 2 and Propofol  infusion, TIVA and Treatment may vary due to age or medical condition  Airway Management Planned: Natural Airway and Nasal Cannula  Additional Equipment:   Intra-op Plan:   Post-operative Plan:   Informed Consent: I have reviewed the patients History and Physical, chart, labs and discussed the procedure including the risks, benefits and alternatives for the proposed anesthesia with the patient or authorized representative who has indicated his/her understanding and acceptance.     Dental advisory given  Plan Discussed with: CRNA and Anesthesiologist  Anesthesia Plan Comments: (Discussed with patient risks of MAC including, but not limited to, minor pain or discomfort, hearing people in the room, and possible need for backup general anesthesia. Risks for general anesthesia also discussed including, but not limited to, sore throat, hoarse voice, chipped/damaged teeth, injury to vocal cords, nausea and vomiting, allergic reactions, lung infection, heart attack, stroke, and death. All questions answered. )         Anesthesia Quick Evaluation

## 2024-05-06 ENCOUNTER — Other Ambulatory Visit: Payer: Self-pay

## 2024-05-06 ENCOUNTER — Ambulatory Visit (HOSPITAL_COMMUNITY): Payer: Self-pay | Admitting: Certified Registered"

## 2024-05-06 ENCOUNTER — Ambulatory Visit (HOSPITAL_COMMUNITY)
Admission: RE | Admit: 2024-05-06 | Discharge: 2024-05-06 | Disposition: A | Discharge status: Home / Self Care | Attending: Internal Medicine | Admitting: Internal Medicine

## 2024-05-06 ENCOUNTER — Encounter (HOSPITAL_COMMUNITY): Payer: Self-pay | Admitting: Internal Medicine

## 2024-05-06 ENCOUNTER — Ambulatory Visit (INDEPENDENT_AMBULATORY_CARE_PROVIDER_SITE_OTHER): Admitting: Podiatry

## 2024-05-06 ENCOUNTER — Ambulatory Visit (HOSPITAL_BASED_OUTPATIENT_CLINIC_OR_DEPARTMENT_OTHER): Payer: Self-pay | Admitting: Certified Registered"

## 2024-05-06 ENCOUNTER — Encounter (HOSPITAL_COMMUNITY)
Admission: RE | Disposition: A | Discharge status: Home / Self Care | Payer: Self-pay | Source: Home / Self Care | Attending: Internal Medicine

## 2024-05-06 ENCOUNTER — Encounter: Payer: Self-pay | Admitting: Podiatry

## 2024-05-06 DIAGNOSIS — E119 Type 2 diabetes mellitus without complications: Secondary | ICD-10-CM | POA: Insufficient documentation

## 2024-05-06 DIAGNOSIS — M7662 Achilles tendinitis, left leg: Secondary | ICD-10-CM

## 2024-05-06 DIAGNOSIS — J45909 Unspecified asthma, uncomplicated: Secondary | ICD-10-CM | POA: Diagnosis not present

## 2024-05-06 DIAGNOSIS — Z794 Long term (current) use of insulin: Secondary | ICD-10-CM | POA: Diagnosis not present

## 2024-05-06 DIAGNOSIS — K5730 Diverticulosis of large intestine without perforation or abscess without bleeding: Secondary | ICD-10-CM | POA: Diagnosis not present

## 2024-05-06 DIAGNOSIS — K219 Gastro-esophageal reflux disease without esophagitis: Secondary | ICD-10-CM | POA: Diagnosis not present

## 2024-05-06 DIAGNOSIS — Z7985 Long-term (current) use of injectable non-insulin antidiabetic drugs: Secondary | ICD-10-CM | POA: Diagnosis not present

## 2024-05-06 DIAGNOSIS — R194 Change in bowel habit: Secondary | ICD-10-CM

## 2024-05-06 DIAGNOSIS — K5909 Other constipation: Secondary | ICD-10-CM | POA: Diagnosis not present

## 2024-05-06 DIAGNOSIS — E6689 Other obesity not elsewhere classified: Secondary | ICD-10-CM | POA: Diagnosis not present

## 2024-05-06 DIAGNOSIS — K5900 Constipation, unspecified: Secondary | ICD-10-CM | POA: Insufficient documentation

## 2024-05-06 DIAGNOSIS — Z79899 Other long term (current) drug therapy: Secondary | ICD-10-CM | POA: Insufficient documentation

## 2024-05-06 DIAGNOSIS — I10 Essential (primary) hypertension: Secondary | ICD-10-CM | POA: Diagnosis not present

## 2024-05-06 DIAGNOSIS — R112 Nausea with vomiting, unspecified: Secondary | ICD-10-CM

## 2024-05-06 DIAGNOSIS — Z6841 Body Mass Index (BMI) 40.0 and over, adult: Secondary | ICD-10-CM | POA: Diagnosis not present

## 2024-05-06 DIAGNOSIS — R197 Diarrhea, unspecified: Secondary | ICD-10-CM | POA: Insufficient documentation

## 2024-05-06 DIAGNOSIS — R1310 Dysphagia, unspecified: Secondary | ICD-10-CM

## 2024-05-06 HISTORY — PX: COLONOSCOPY: SHX5424

## 2024-05-06 HISTORY — PX: ESOPHAGOGASTRODUODENOSCOPY: SHX5428

## 2024-05-06 LAB — GLUCOSE, CAPILLARY: Glucose-Capillary: 115 mg/dL — ABNORMAL HIGH (ref 70–99)

## 2024-05-06 SURGERY — EGD (ESOPHAGOGASTRODUODENOSCOPY)
Anesthesia: Monitor Anesthesia Care

## 2024-05-06 MED ORDER — GLYCOPYRROLATE PF 0.2 MG/ML IJ SOSY
PREFILLED_SYRINGE | INTRAMUSCULAR | Status: DC | PRN
  Administered 2024-05-06 (×2): .1 mg via INTRAVENOUS

## 2024-05-06 MED ORDER — PROPOFOL 1000 MG/100ML IV EMUL
INTRAVENOUS | Status: AC
  Filled 2024-05-06: qty 100

## 2024-05-06 MED ORDER — DEXMEDETOMIDINE HCL IN NACL 80 MCG/20ML IV SOLN
INTRAVENOUS | Status: AC
Start: 2024-05-06 — End: 2024-05-06
  Filled 2024-05-06: qty 20

## 2024-05-06 MED ORDER — LIDOCAINE 2% (20 MG/ML) 5 ML SYRINGE
INTRAMUSCULAR | Status: DC | PRN
Start: 2024-05-06 — End: 2024-05-06
  Administered 2024-05-06: 100 mg via INTRAVENOUS

## 2024-05-06 MED ORDER — LACTATED RINGERS IV SOLN: INTRAVENOUS | Status: DC | PRN

## 2024-05-06 MED ORDER — DEXMEDETOMIDINE HCL IN NACL 80 MCG/20ML IV SOLN
INTRAVENOUS | Status: DC | PRN
  Administered 2024-05-06: 6 ug via INTRAVENOUS

## 2024-05-06 MED ORDER — SODIUM CHLORIDE 0.9 % IV SOLN: INTRAVENOUS | Status: DC

## 2024-05-06 MED ORDER — PROPOFOL 10 MG/ML IV BOLUS
INTRAVENOUS | Status: DC | PRN
  Administered 2024-05-06: 50 mg via INTRAVENOUS
  Administered 2024-05-06: 30 mg via INTRAVENOUS
  Administered 2024-05-06: 125 ug/kg/min via INTRAVENOUS
  Administered 2024-05-06: 30 mg via INTRAVENOUS
  Administered 2024-05-06: 40 mg via INTRAVENOUS

## 2024-05-06 NOTE — Op Note (Signed)
 [facility] Patient Name: Molly Archer Procedure Date: 05/06/2024 MRN: [id number] Attending MD: Norleen SAILOR. Abran , MD, [id number] Date of Birth: 1980/10/12 CSN: [id number] Age: 43 Admit Type: Outpatient Procedure:                Colonoscopy Indications:              Change in bowel habits, Constipation, Diarrhea Providers:                Norleen SAILOR. Abran, MD, Darleene Bare, RN, Haskel Chris, Technician Referring MD:             Raymond de Peru, MD Medicines:                Monitored Anesthesia Care Complications:            No immediate complications. Estimated blood loss:                            None. Estimated Blood Loss:     Estimated blood loss: none. Procedure:                Pre-Anesthesia Assessment:                           - Prior to the procedure, a History and Physical                            was performed, and patient medications and                            allergies were reviewed. The patient's tolerance of                            previous anesthesia was also reviewed. The risks                            and benefits of the procedure and the sedation                            options and risks were discussed with the patient.                            All questions were answered, and informed consent                            was obtained. Prior Anticoagulants: The patient has                            taken no anticoagulant or antiplatelet agents. ASA                            Grade Assessment: III - A patient with severe  systemic disease. After reviewing the risks and                            benefits, the patient was deemed in satisfactory                            condition to undergo the procedure.                           After obtaining informed consent, the colonoscope                            was passed under direct vision. Throughout the                             procedure, the patient's blood pressure, pulse, and                            oxygen saturations were monitored continuously. The                            CF-HQ190L ([id number]) Olympus colonoscope was                            introduced through the anus and advanced to the the                            cecum, identified by appendiceal orifice and                            ileocecal valve. The terminal ileum, ileocecal                            valve, appendiceal orifice, and rectum were                            photographed. The quality of the bowel preparation                            was excellent. The colonoscopy was performed                            without difficulty. The patient tolerated the                            procedure well. The bowel preparation used was                            SUPREP via split dose instruction. Scope In: 9:57:07 AM Scope Out: 10:08:36 AM Scope Withdrawal Time: 0 hours 7 minutes 26 seconds  Total Procedure Duration: 0 hours 11 minutes 29 seconds  Findings:      The terminal ileum appeared normal.      Diverticula were found in the entire colon.      The exam was otherwise  without abnormality on direct and retroflexion       views. Impression:               - Diverticulosis in the entire examined colon.                            Normal terminal ileum.                           - The examination was otherwise normal on direct                            and retroflexion views.                           - No specimens collected. Moderate Sedation:      none Recommendation:           - Repeat colonoscopy in 10 years for screening                            purposes.                           - Patient has a contact number available for                            emergencies. The signs and symptoms of potential                            delayed complications were discussed with the                            patient. Return to normal  activities tomorrow.                            Written discharge instructions were provided to the                            patient.                           - Resume previous diet.                           - Continue present medications.                           - Take a fiber supplement like Citrucel 2                            tablespoons daily. This may help regulate your                            bowels Procedure Code(s):        --- Professional ---  54621, Colonoscopy, flexible; diagnostic, including                            collection of specimen(s) by brushing or washing,                            when performed (separate procedure) Diagnosis Code(s):        --- Professional ---                           R19.4, Change in bowel habit                           K59.00, Constipation, unspecified                           R19.7, Diarrhea, unspecified                           K57.30, Diverticulosis of large intestine without                            perforation or abscess without bleeding CPT copyright 2022 American Medical Association. All rights reserved. The codes documented in this report are preliminary and upon coder review may  be revised to meet current compliance requirements. Norleen SAILOR. Abran, MD 05/06/2024 10:19:03 AM This report has been signed electronically. Number of Addenda: 0

## 2024-05-06 NOTE — Anesthesia Procedure Notes (Signed)
 Date/Time: 05/06/2024 9:47 AM  Performed by: Para Jerelene CROME, CRNAOxygen Delivery Method: Nasal cannula Comments: OptiFlow Nasal Cannula.

## 2024-05-06 NOTE — Transfer of Care (Addendum)
 Immediate Anesthesia Transfer of Care Note  Patient: Molly Archer  Procedure(s) Performed: EGD (ESOPHAGOGASTRODUODENOSCOPY) COLONOSCOPY  Patient Location: Endoscopy Unit  Anesthesia Type:MAC  Level of Consciousness: drowsy and patient cooperative  Airway & Oxygen Therapy: Patient Spontanous Breathing and Patient connected to nasal cannula oxygen  Post-op Assessment: Report given to RN and Post -op Vital signs reviewed and stable  Post vital signs: Reviewed and stable  Last Vitals:  Vitals Value Taken Time  BP 156/68 05/06/24 10:32  Temp 36.6 05/06/24   10:32  Pulse 74 05/06/24 10:35  Resp 20 05/06/24 10:35  SpO2 99 % 05/06/24 10:35  Vitals shown include unfiled device data.  Last Pain:  Vitals:   05/06/24 0836  TempSrc: Temporal  PainSc: 0-No pain         Complications: No notable events documented.

## 2024-05-06 NOTE — Progress Notes (Unsigned)
  Subjective:  Patient ID: Molly Archer, female    DOB: 1980/09/17,   MRN: [id number]  Chief Complaint  Patient presents with   Tendonitis    It's the same, terrible.    43 y.o. female presents for follow-up of left achilles tendonitis. Relates she is not doing much better and still getting pain.  Denies any other pedal complaints. Denies n/v/f/c.   Past Medical History:  Diagnosis Date   Allergy    Anemia    pt states she took iron for this around 2019, and has had no issues since   Arthritis    knees   Asthma    only uses inhaler in fall and spring   Constipation    Diabetes mellitus without complication (HCC)    GERD (gastroesophageal reflux disease)    diet controlled, occ. uses omeprazole    Headache    otc med prn   Heart murmur    History of kidney stones    Hx of Bell's palsy 12/2011   Hypertension    Menometrorrhagia    Nausea and vomiting 04/14/2024   Psoriasis    Shortness of breath dyspnea    with exercise/exertion   Vitamin D  deficiency     Objective:  Physical Exam: Vascular: DP/PT pulses 2/4 bilateral. CFT <3 seconds. Normal hair growth on digits. No edema.  Skin. No lacerations or abrasions bilateral feet.  Musculoskeletal: MMT 5/5 bilateral lower extremities in DF, PF, Inversion and Eversion. Deceased ROM in DF of ankle joint. Tender to insertion of achilles tednon on the left foot and pain with DF. Mildly tender pain to medial calcaneal tubercle or with calcaneal squeeze.  Neurological: Sensation intact to light touch.   Assessment:   1. Achilles tendinitis, left leg         Plan:  Patient was evaluated and treated and all questions answered. -Xrays reviewed. No acute fractures or dislocations noted. Spurring noted to posterior heel on the left.  -Discussed Achilles insertional tendonitis and treatment options with patient.  Continue stretching and lifts as needed.  MRI  reiviwed and noted tennosis of achilles tendon and at insertion  site.  -PRP discussed and will consider.   -Discussed plantar fasciitis with patient.  Discussed treatment options including, ice, NSAIDS, supportive shoes, bracing, and stretching.  Dsicussed PRP injection vs surgery patient will consider  CAM boot dipsensed.   -Patient to return to office when decision on path made.   Asberry Failing, DPM

## 2024-05-06 NOTE — Interval H&P Note (Signed)
 History and Physical Interval Note:  05/06/2024 9:32 AM  Molly Archer  has presented today for surgery, with the diagnosis of GERD, dysphagia, nausea, vomiting, change in bowel habits.  The various methods of treatment have been discussed with the patient and family. After consideration of risks, benefits and other options for treatment, the patient has consented to  Procedure(s): EGD (ESOPHAGOGASTRODUODENOSCOPY) (N/A) COLONOSCOPY (N/A) as a surgical intervention.  The patient's history has been reviewed, patient examined, no change in status, stable for surgery.  I have reviewed the patient's chart and labs.  Questions were answered to the patient's satisfaction.     Norleen Kiang

## 2024-05-06 NOTE — Discharge Instructions (Signed)

## 2024-05-06 NOTE — Anesthesia Postprocedure Evaluation (Signed)
 Anesthesia Post Note  Patient: Necha T Mcwilliams  Procedure(s) Performed: EGD (ESOPHAGOGASTRODUODENOSCOPY) COLONOSCOPY     Patient location during evaluation: PACU Anesthesia Type: MAC Level of consciousness: awake Pain management: pain level controlled Vital Signs Assessment: post-procedure vital signs reviewed and stable Respiratory status: spontaneous breathing, nonlabored ventilation and respiratory function stable Cardiovascular status: stable and blood pressure returned to baseline Postop Assessment: no apparent nausea or vomiting Anesthetic complications: no   No notable events documented.  Last Vitals:  Vitals:   05/06/24 1050 05/06/24 1100  BP: (!) 161/52 (!) 146/67  Pulse: 60 (!) 57  Resp: (!) 24 10  Temp:    SpO2: 97% 97%    Last Pain:  Vitals:   05/06/24 1100  TempSrc:   PainSc: 0-No pain                 Delon Aisha Arch

## 2024-05-06 NOTE — Addendum Note (Signed)
 Addendum  created 05/06/24 1337 by Para Jerelene CROME, CRNA   Clinical Note Signed, Flowsheet accepted, Intraprocedure Flowsheets edited

## 2024-05-06 NOTE — Op Note (Signed)
 [facility] Patient Name: Molly Archer Procedure Date: 05/06/2024 MRN: [id number] Attending MD: Norleen SAILOR. Abran , MD, [id number] Date of Birth: 1981-06-27 CSN: [id number] Age: 43 Admit Type: Outpatient Procedure:                Upper GI endoscopy Indications:              Epigastric abdominal pain, Nausea with vomiting Providers:                Norleen SAILOR. Abran, MD, Darleene Bare, RN, Haskel Chris, Technician Referring MD:             Quintin Sheerer Peru MD Medicines:                Monitored Anesthesia Care Complications:            No immediate complications. Estimated Blood Loss:     Estimated blood loss: none. Procedure:                Pre-Anesthesia Assessment:                           - Prior to the procedure, a History and Physical                            was performed, and patient medications and                            allergies were reviewed. The patient's tolerance of                            previous anesthesia was also reviewed. The risks                            and benefits of the procedure and the sedation                            options and risks were discussed with the patient.                            All questions were answered, and informed consent                            was obtained. Prior Anticoagulants: The patient has                            taken no anticoagulant or antiplatelet agents. ASA                            Grade Assessment: III - A patient with severe                            systemic disease. After reviewing the risks and  benefits, the patient was deemed in satisfactory                            condition to undergo the procedure.                           After obtaining informed consent, the endoscope was                            passed under direct vision. Throughout the                            procedure, the patient's blood pressure, pulse, and                             oxygen saturations were monitored continuously. The                            [id number]-[id number] ([id number]) Olympus endoscope was introduced                            through the mouth, and advanced to the second part                            of duodenum. The upper GI endoscopy was                            accomplished without difficulty. The patient                            tolerated the procedure well. Scope In: Scope Out: Findings:      The esophagus was normal.      The stomach was normal.      The examined duodenum was normal.      The cardia and gastric fundus were normal on retroflexion. Impression:               1. Normal EGD                           2. No GI cause for nausea vomiting and abdominal                            pain found.                           3. Symptoms may be due to medications, particularly                            pain medication. As well, poorly controlled                            diabetes with last A1c 9.8 Moderate Sedation:      none Recommendation:           1. See colonoscopy report  2. Better control of diabetes. Work with your PCP                            on this important issue                           3. Minimize pain medication. This is a common cause                            for GI distress                           4. Reflux precautions with attention to weight loss                           5. Return to the care of your primary provider. Procedure Code(s):        --- Professional ---                           [phone], Esophagogastroduodenoscopy, flexible,                            transoral; diagnostic, including collection of                            specimen(s) by brushing or washing, when performed                            (separate procedure) Diagnosis Code(s):        --- Professional ---                           R10.13, Epigastric pain                           R11.2, Nausea  with vomiting, unspecified CPT copyright 2022 American Medical Association. All rights reserved. The codes documented in this report are preliminary and upon coder review may  be revised to meet current compliance requirements. Norleen SAILOR. Abran, MD 05/06/2024 10:35:36 AM This report has been signed electronically. Number of Addenda: 0

## 2024-05-07 ENCOUNTER — Other Ambulatory Visit: Payer: Self-pay | Admitting: Physical Medicine & Rehabilitation

## 2024-05-07 ENCOUNTER — Other Ambulatory Visit (HOSPITAL_BASED_OUTPATIENT_CLINIC_OR_DEPARTMENT_OTHER): Payer: Self-pay | Admitting: Family Medicine

## 2024-05-07 ENCOUNTER — Encounter (HOSPITAL_COMMUNITY): Payer: Self-pay | Admitting: Internal Medicine

## 2024-05-07 DIAGNOSIS — M1711 Unilateral primary osteoarthritis, right knee: Secondary | ICD-10-CM

## 2024-05-07 DIAGNOSIS — M47816 Spondylosis without myelopathy or radiculopathy, lumbar region: Secondary | ICD-10-CM

## 2024-05-08 ENCOUNTER — Other Ambulatory Visit (HOSPITAL_BASED_OUTPATIENT_CLINIC_OR_DEPARTMENT_OTHER): Payer: Self-pay | Admitting: Family Medicine

## 2024-05-08 ENCOUNTER — Other Ambulatory Visit (HOSPITAL_BASED_OUTPATIENT_CLINIC_OR_DEPARTMENT_OTHER): Payer: Self-pay

## 2024-05-08 MED ORDER — CICLOPIROX 1 % EX SHAM
1.0000 | MEDICATED_SHAMPOO | Freq: Every day | CUTANEOUS | 0 refills | Status: DC
  Filled 2024-05-08: qty 120, 30d supply, fill #0

## 2024-05-08 MED ORDER — TIZANIDINE HCL 4 MG PO TABS
4.0000 mg | ORAL_TABLET | Freq: Three times a day (TID) | ORAL | 0 refills | Status: DC | PRN
  Filled 2024-05-08: qty 30, 10d supply, fill #0

## 2024-05-08 MED ORDER — OXYCODONE HCL 10 MG PO TABS
10.0000 mg | ORAL_TABLET | Freq: Three times a day (TID) | ORAL | 0 refills | Status: DC | PRN
  Filled 2024-05-08: qty 75, 25d supply, fill #0

## 2024-05-08 MED ORDER — ZOLPIDEM TARTRATE 5 MG PO TABS
5.0000 mg | ORAL_TABLET | Freq: Every evening | ORAL | 0 refills | Status: DC | PRN
  Filled 2024-05-08: qty 30, 30d supply, fill #0

## 2024-05-11 ENCOUNTER — Other Ambulatory Visit (HOSPITAL_BASED_OUTPATIENT_CLINIC_OR_DEPARTMENT_OTHER): Payer: Self-pay

## 2024-05-11 ENCOUNTER — Other Ambulatory Visit: Payer: Self-pay

## 2024-05-11 ENCOUNTER — Ambulatory Visit: Admitting: Licensed Clinical Social Worker

## 2024-05-11 DIAGNOSIS — F4322 Adjustment disorder with anxiety: Secondary | ICD-10-CM

## 2024-05-11 MED ORDER — FLUOCINOLONE ACETONIDE SCALP 0.01 % EX OIL
1.0000 | TOPICAL_OIL | CUTANEOUS | 1 refills | Status: AC
  Filled 2024-05-11: qty 118.28, 30d supply, fill #0
  Filled 2024-07-05: qty 118.28, 30d supply, fill #1

## 2024-05-11 NOTE — Progress Notes (Signed)
 [facility] Counselor/Therapist Progress Note  Patient ID: Molly Archer, MRN: [id number]    Date: 05/11/24  Time Spent: 405  pm - 0458 pm : 53 Minutes  Treatment Type: Individual Therapy.  Reported Symptoms: anxiety related to social interaction and church relationships and work   Mental Status Exam: Appearance:  Casual     Behavior: Appropriate  Motor: Normal  Speech/Language:  Clear and Coherent  Affect: Appropriate  Mood: normal  Thought process: normal  Thought content:   WNL  Sensory/Perceptual disturbances:   WNL  Orientation: oriented to person, place, time/date, situation, day of week, month of year, and year  Attention: Good  Concentration: Good  Memory: WNL  Fund of knowledge:  Good  Insight:   Good  Judgment:  Good  Impulse Control: Good    Risk Assessment: Danger to Self:  No Self-injurious Behavior: No Danger to Others: No Duty to Warn:no Physical Aggression / Violence:No  Access to Firearms a concern: No  Gang Involvement:No    Subjective:    [name] participated from home, via video, patient was aware of risk and limitations, and consented to treatment. Therapist participated from office. We met online due to patient request.   Donnarae presented for her session stating she is just leaving work. She reports that she has been doing well. She is still working and states she is remaining there until she finds another job. She reports that she recently had an interview but wasn't hired but she states it went well and she  was glad to have had the experience. She reports that her friend that she helped get hired has been acting out in [employer] and arguing in the meetings.   Clinician actively listened and provided support. Clinician and patient processed patients anxiety and discussed coping skills to assist with her symptoms.   [name] was fully engaged in session and was polite and cooperative. [name] is motivated for  treatment as evidenced by her active involvement in session. [name] will continue to engage in CBT, mindfulness and coping skills to help manage decrease symptoms associated with their diagnosis.  Reduce overall level, frequency, and intensity of the feelings of depression and anxiety evidenced by decreased  negative self talk, and helpless feelings from 6 to 7 days/week to 0 to 1 days/week per client report for at least 3 consecutive months.Clinician and patient discussed and developed treatment plan.  Treatment plan to be reviewed by 05/11/2025.     Interventions: Cognitive Behavioral Therapy, Dialectical Behavioral Therapy, Mindfulness Meditation, Motivational Interviewing, and Solution-Oriented/Positive Psychology   Diagnosis: Adjustment Disorder with Anxious mood    Damien Junk MSW, LCSW/DATE10/13/2025

## 2024-05-14 ENCOUNTER — Ambulatory Visit: Admitting: Physician Assistant

## 2024-05-19 ENCOUNTER — Other Ambulatory Visit (HOSPITAL_BASED_OUTPATIENT_CLINIC_OR_DEPARTMENT_OTHER): Payer: Self-pay | Admitting: Family Medicine

## 2024-05-19 ENCOUNTER — Other Ambulatory Visit (HOSPITAL_BASED_OUTPATIENT_CLINIC_OR_DEPARTMENT_OTHER): Payer: Self-pay

## 2024-05-19 ENCOUNTER — Other Ambulatory Visit: Payer: Self-pay

## 2024-05-19 MED ORDER — TRIAMCINOLONE ACETONIDE 0.1 % EX CREA
1.0000 | TOPICAL_CREAM | Freq: Two times a day (BID) | CUTANEOUS | 0 refills | Status: DC
  Filled 2024-05-19: qty 30, 30d supply, fill #0

## 2024-05-19 MED ORDER — METOCLOPRAMIDE HCL 10 MG PO TABS
10.0000 mg | ORAL_TABLET | Freq: Three times a day (TID) | ORAL | 1 refills | Status: AC | PRN
  Filled 2024-05-19: qty 30, 10d supply, fill #0
  Filled 2024-07-10: qty 30, 10d supply, fill #1

## 2024-05-19 MED ORDER — CLOBETASOL PROPIONATE 0.05 % EX OINT
1.0000 | TOPICAL_OINTMENT | Freq: Two times a day (BID) | CUTANEOUS | 1 refills | Status: DC
  Filled 2024-05-19: qty 30, 30d supply, fill #0
  Filled 2024-07-05: qty 30, 30d supply, fill #1

## 2024-05-19 MED ORDER — NAFTIFINE HCL 2 % EX CREA
TOPICAL_CREAM | CUTANEOUS | 1 refills | Status: DC
  Filled 2024-05-19: qty 60, 30d supply, fill #0
  Filled 2024-07-05: qty 60, 30d supply, fill #1

## 2024-05-20 ENCOUNTER — Other Ambulatory Visit (HOSPITAL_BASED_OUTPATIENT_CLINIC_OR_DEPARTMENT_OTHER): Payer: Self-pay

## 2024-05-20 ENCOUNTER — Ambulatory Visit (HOSPITAL_BASED_OUTPATIENT_CLINIC_OR_DEPARTMENT_OTHER): Admitting: Family Medicine

## 2024-05-20 ENCOUNTER — Other Ambulatory Visit (HOSPITAL_BASED_OUTPATIENT_CLINIC_OR_DEPARTMENT_OTHER): Payer: Self-pay | Admitting: Family Medicine

## 2024-05-20 DIAGNOSIS — E1165 Type 2 diabetes mellitus with hyperglycemia: Secondary | ICD-10-CM

## 2024-05-20 MED ORDER — ACCU-CHEK SOFTCLIX LANCETS MISC
1.0000 | Freq: Three times a day (TID) | 0 refills | Status: AC
  Filled 2024-05-20: qty 100, 34d supply, fill #0

## 2024-05-20 MED ORDER — BLOOD GLUCOSE TEST VI STRP
1.0000 | ORAL_STRIP | Freq: Three times a day (TID) | 0 refills | Status: AC
  Filled 2024-05-20: qty 100, 34d supply, fill #0

## 2024-05-22 ENCOUNTER — Other Ambulatory Visit (HOSPITAL_BASED_OUTPATIENT_CLINIC_OR_DEPARTMENT_OTHER): Payer: Self-pay

## 2024-05-26 ENCOUNTER — Encounter (HOSPITAL_BASED_OUTPATIENT_CLINIC_OR_DEPARTMENT_OTHER): Payer: Self-pay | Admitting: Family Medicine

## 2024-05-26 ENCOUNTER — Other Ambulatory Visit (HOSPITAL_BASED_OUTPATIENT_CLINIC_OR_DEPARTMENT_OTHER): Payer: Self-pay

## 2024-05-26 NOTE — Telephone Encounter (Signed)
 Please see mychart message sent by pt and advise.

## 2024-05-30 ENCOUNTER — Other Ambulatory Visit (HOSPITAL_BASED_OUTPATIENT_CLINIC_OR_DEPARTMENT_OTHER): Payer: Self-pay

## 2024-06-01 ENCOUNTER — Other Ambulatory Visit (HOSPITAL_BASED_OUTPATIENT_CLINIC_OR_DEPARTMENT_OTHER): Payer: Self-pay

## 2024-06-01 ENCOUNTER — Encounter: Payer: Self-pay | Admitting: Radiology

## 2024-06-01 NOTE — Telephone Encounter (Signed)
 Message sent to [facility] team. Was told that test claim was done and that Lantus  was covered. Routing to  Dr. De Cuba.

## 2024-06-01 NOTE — Telephone Encounter (Signed)
 Please see new message sent by pt and advise.

## 2024-06-02 ENCOUNTER — Other Ambulatory Visit (HOSPITAL_BASED_OUTPATIENT_CLINIC_OR_DEPARTMENT_OTHER): Payer: Self-pay

## 2024-06-02 ENCOUNTER — Ambulatory Visit: Admitting: Licensed Clinical Social Worker

## 2024-06-02 DIAGNOSIS — F4322 Adjustment disorder with anxiety: Secondary | ICD-10-CM

## 2024-06-02 MED ORDER — LANTUS SOLOSTAR 100 UNIT/ML ~~LOC~~ SOPN
12.0000 [IU] | PEN_INJECTOR | Freq: Every day | SUBCUTANEOUS | 3 refills | Status: AC
  Filled 2024-06-02: qty 9, 75d supply, fill #0
  Filled 2024-08-16: qty 9, 75d supply, fill #1
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
## 2024-06-04 ENCOUNTER — Other Ambulatory Visit (HOSPITAL_BASED_OUTPATIENT_CLINIC_OR_DEPARTMENT_OTHER): Payer: Self-pay

## 2024-06-04 ENCOUNTER — Ambulatory Visit (HOSPITAL_BASED_OUTPATIENT_CLINIC_OR_DEPARTMENT_OTHER): Admitting: Family Medicine

## 2024-06-09 ENCOUNTER — Other Ambulatory Visit (HOSPITAL_BASED_OUTPATIENT_CLINIC_OR_DEPARTMENT_OTHER): Payer: Self-pay | Admitting: Family Medicine

## 2024-06-09 ENCOUNTER — Other Ambulatory Visit: Payer: Self-pay | Admitting: Physical Medicine & Rehabilitation

## 2024-06-09 DIAGNOSIS — M1711 Unilateral primary osteoarthritis, right knee: Secondary | ICD-10-CM

## 2024-06-09 DIAGNOSIS — M47816 Spondylosis without myelopathy or radiculopathy, lumbar region: Secondary | ICD-10-CM

## 2024-06-10 ENCOUNTER — Other Ambulatory Visit (HOSPITAL_BASED_OUTPATIENT_CLINIC_OR_DEPARTMENT_OTHER): Payer: Self-pay

## 2024-06-10 ENCOUNTER — Other Ambulatory Visit (HOSPITAL_BASED_OUTPATIENT_CLINIC_OR_DEPARTMENT_OTHER): Payer: Self-pay | Admitting: Family Medicine

## 2024-06-10 ENCOUNTER — Other Ambulatory Visit: Payer: Self-pay

## 2024-06-10 ENCOUNTER — Encounter (HOSPITAL_BASED_OUTPATIENT_CLINIC_OR_DEPARTMENT_OTHER): Payer: Self-pay | Admitting: Family Medicine

## 2024-06-10 MED ORDER — ZOLPIDEM TARTRATE 5 MG PO TABS
5.0000 mg | ORAL_TABLET | Freq: Every evening | ORAL | 0 refills | Status: DC | PRN
  Filled 2024-06-10: qty 30, 30d supply, fill #0

## 2024-06-10 MED ORDER — TIZANIDINE HCL 4 MG PO TABS
4.0000 mg | ORAL_TABLET | Freq: Three times a day (TID) | ORAL | 0 refills | Status: DC | PRN
  Filled 2024-06-10: qty 30, 10d supply, fill #0

## 2024-06-10 MED ORDER — HYDROCHLOROTHIAZIDE 12.5 MG PO CAPS
12.5000 mg | ORAL_CAPSULE | Freq: Every day | ORAL | 1 refills | Status: AC
  Filled 2024-06-10: qty 90, 90d supply, fill #0

## 2024-06-10 MED ORDER — OXYCODONE HCL 10 MG PO TABS
10.0000 mg | ORAL_TABLET | Freq: Three times a day (TID) | ORAL | 0 refills | Status: DC | PRN
  Filled 2024-06-10: qty 75, 25d supply, fill #0

## 2024-06-10 MED ORDER — IRBESARTAN-HYDROCHLOROTHIAZIDE 300-12.5 MG PO TABS
1.0000 | ORAL_TABLET | Freq: Every day | ORAL | 1 refills | Status: AC
  Filled 2024-06-10: qty 90, 90d supply, fill #0

## 2024-06-10 MED ORDER — ZOLPIDEM TARTRATE 5 MG PO TABS: 5.0000 mg | ORAL_TABLET | Freq: Every evening | ORAL | 0 refills | Status: DC | PRN

## 2024-06-10 NOTE — Telephone Encounter (Signed)
 Refill request was received for pt's hydrochlorothiazide  and ambien . Refilled pt's hydrochlorothiazide  and accidentally closed the encounter prior to routing about refill request for ambien . Ambien  Rx printed. Routing to Dr. De Cuba so he can electronically send Rx for ambien  to pharmacy for pt.

## 2024-06-11 ENCOUNTER — Ambulatory Visit (HOSPITAL_COMMUNITY): Admitting: Licensed Clinical Social Worker

## 2024-06-11 NOTE — Telephone Encounter (Signed)
 Please see mychart message sent by pt and advise.

## 2024-06-22 ENCOUNTER — Ambulatory Visit: Admitting: Licensed Clinical Social Worker

## 2024-06-23 ENCOUNTER — Other Ambulatory Visit (HOSPITAL_BASED_OUTPATIENT_CLINIC_OR_DEPARTMENT_OTHER): Payer: Self-pay

## 2024-06-23 MED ORDER — PREDNISONE 20 MG PO TABS
40.0000 mg | ORAL_TABLET | Freq: Every day | ORAL | 0 refills | Status: DC
  Filled 2024-06-23: qty 10, 5d supply, fill #0

## 2024-06-23 MED ORDER — PROMETHAZINE-DM 6.25-15 MG/5ML PO SYRP
5.0000 mL | ORAL_SOLUTION | Freq: Four times a day (QID) | ORAL | 0 refills | Status: DC | PRN
  Filled 2024-06-23: qty 180, 5d supply, fill #0

## 2024-06-23 MED ORDER — AZITHROMYCIN 250 MG PO TABS
ORAL_TABLET | ORAL | 0 refills | Status: DC
  Filled 2024-06-23: qty 6, 5d supply, fill #0

## 2024-06-29 ENCOUNTER — Encounter (HOSPITAL_COMMUNITY): Payer: Self-pay | Admitting: Internal Medicine

## 2024-06-29 ENCOUNTER — Emergency Department (HOSPITAL_BASED_OUTPATIENT_CLINIC_OR_DEPARTMENT_OTHER): Admitting: Radiology

## 2024-06-29 ENCOUNTER — Other Ambulatory Visit (HOSPITAL_BASED_OUTPATIENT_CLINIC_OR_DEPARTMENT_OTHER): Payer: Self-pay

## 2024-06-29 ENCOUNTER — Observation Stay (HOSPITAL_BASED_OUTPATIENT_CLINIC_OR_DEPARTMENT_OTHER)
Admission: EM | Admit: 2024-06-29 | Discharge: 2024-06-30 | Disposition: A | Source: Ambulatory Visit | Attending: Internal Medicine | Admitting: Internal Medicine

## 2024-06-29 ENCOUNTER — Other Ambulatory Visit: Payer: Self-pay

## 2024-06-29 DIAGNOSIS — Z794 Long term (current) use of insulin: Secondary | ICD-10-CM | POA: Diagnosis not present

## 2024-06-29 DIAGNOSIS — M5416 Radiculopathy, lumbar region: Secondary | ICD-10-CM | POA: Diagnosis present

## 2024-06-29 DIAGNOSIS — K219 Gastro-esophageal reflux disease without esophagitis: Secondary | ICD-10-CM | POA: Diagnosis present

## 2024-06-29 DIAGNOSIS — J45901 Unspecified asthma with (acute) exacerbation: Principal | ICD-10-CM | POA: Diagnosis present

## 2024-06-29 DIAGNOSIS — Z79891 Long term (current) use of opiate analgesic: Secondary | ICD-10-CM | POA: Diagnosis not present

## 2024-06-29 DIAGNOSIS — E119 Type 2 diabetes mellitus without complications: Secondary | ICD-10-CM | POA: Insufficient documentation

## 2024-06-29 DIAGNOSIS — J45909 Unspecified asthma, uncomplicated: Secondary | ICD-10-CM | POA: Insufficient documentation

## 2024-06-29 DIAGNOSIS — E66813 Obesity, class 3: Secondary | ICD-10-CM | POA: Diagnosis not present

## 2024-06-29 DIAGNOSIS — I10 Essential (primary) hypertension: Secondary | ICD-10-CM | POA: Diagnosis not present

## 2024-06-29 DIAGNOSIS — Z6841 Body Mass Index (BMI) 40.0 and over, adult: Secondary | ICD-10-CM | POA: Insufficient documentation

## 2024-06-29 DIAGNOSIS — R0602 Shortness of breath: Secondary | ICD-10-CM | POA: Diagnosis present

## 2024-06-29 LAB — GLUCOSE, CAPILLARY: Glucose-Capillary: 457 mg/dL — ABNORMAL HIGH (ref 70–99)

## 2024-06-29 LAB — I-STAT VENOUS BLOOD GAS, ED
Acid-Base Excess: 1 mmol/L (ref 0.0–2.0)
Bicarbonate: 24 mmol/L (ref 20.0–28.0)
Calcium, Ion: 1.13 mmol/L — ABNORMAL LOW (ref 1.15–1.40)
HCT: 45 % (ref 36.0–46.0)
Hemoglobin: 15.3 g/dL — ABNORMAL HIGH (ref 12.0–15.0)
O2 Saturation: 93 %
Potassium: 3.4 mmol/L — ABNORMAL LOW (ref 3.5–5.1)
Sodium: 134 mmol/L — ABNORMAL LOW (ref 135–145)
TCO2: 25 mmol/L (ref 22–32)
pCO2, Ven: 33.8 mmHg — ABNORMAL LOW (ref 44–60)
pH, Ven: 7.46 — ABNORMAL HIGH (ref 7.25–7.43)
pO2, Ven: 63 mmHg — ABNORMAL HIGH (ref 32–45)

## 2024-06-29 LAB — CBC
HCT: 43.2 % (ref 36.0–46.0)
HCT: 42.1 % (ref 36.0–46.0)
Hemoglobin: 14.5 g/dL (ref 12.0–15.0)
Hemoglobin: 14.1 g/dL (ref 12.0–15.0)
MCH: 28.1 pg (ref 26.0–34.0)
MCH: 28 pg (ref 26.0–34.0)
MCHC: 33.6 g/dL (ref 30.0–36.0)
MCHC: 33.5 g/dL (ref 30.0–36.0)
MCV: 83.7 fL (ref 80.0–100.0)
MCV: 83.7 fL (ref 80.0–100.0)
Platelets: 354 K/uL (ref 150–400)
Platelets: 359 K/uL (ref 150–400)
RBC: 5.16 MIL/uL — ABNORMAL HIGH (ref 3.87–5.11)
RBC: 5.03 MIL/uL (ref 3.87–5.11)
RDW: 14 % (ref 11.5–15.5)
RDW: 14.1 % (ref 11.5–15.5)
WBC: 13.3 K/uL — ABNORMAL HIGH (ref 4.0–10.5)
WBC: 12 K/uL — ABNORMAL HIGH (ref 4.0–10.5)
nRBC: 0 % (ref 0.0–0.2)
nRBC: 0 % (ref 0.0–0.2)

## 2024-06-29 LAB — BASIC METABOLIC PANEL WITH GFR
Anion gap: 17 — ABNORMAL HIGH (ref 5–15)
BUN: 14 mg/dL (ref 6–20)
CO2: 23 mmol/L (ref 22–32)
Calcium: 9.8 mg/dL (ref 8.9–10.3)
Chloride: 96 mmol/L — ABNORMAL LOW (ref 98–111)
Creatinine, Ser: 0.65 mg/dL (ref 0.44–1.00)
GFR, Estimated: >60 mL/min (ref >60)
Glucose, Bld: 245 mg/dL — ABNORMAL HIGH (ref 70–99)
Potassium: 3.8 mmol/L (ref 3.5–5.1)
Sodium: 135 mmol/L (ref 135–145)

## 2024-06-29 LAB — CBG MONITORING, ED: Glucose-Capillary: 349 mg/dL — ABNORMAL HIGH (ref 70–99)

## 2024-06-29 LAB — TROPONIN T, HIGH SENSITIVITY
Troponin T High Sensitivity: <15 ng/L (ref 0–19)
Troponin T High Sensitivity: <15 ng/L (ref 0–19)
Troponin T High Sensitivity: <15 ng/L (ref 0–19)

## 2024-06-29 LAB — RESP PANEL BY RT-PCR (RSV, FLU A&B, COVID)  RVPGX2
Influenza A by PCR: NEGATIVE
Influenza B by PCR: NEGATIVE
Resp Syncytial Virus by PCR: NEGATIVE
SARS Coronavirus 2 by RT PCR: NEGATIVE

## 2024-06-29 LAB — PREGNANCY, URINE: Preg Test, Ur: NEGATIVE

## 2024-06-29 LAB — PRO BRAIN NATRIURETIC PEPTIDE: Pro Brain Natriuretic Peptide: <50 pg/mL (ref <300.0)

## 2024-06-29 LAB — CREATININE, SERUM
Creatinine, Ser: 1.05 mg/dL — ABNORMAL HIGH (ref 0.44–1.00)
GFR, Estimated: >60 mL/min (ref >60)

## 2024-06-29 LAB — D-DIMER, QUANTITATIVE: D-Dimer, Quant: <0.27 ug{FEU}/mL (ref 0.00–0.50)

## 2024-06-29 MED ORDER — PROMETHAZINE-DM 6.25-15 MG/5ML PO SYRP
5.0000 mL | ORAL_SOLUTION | Freq: Four times a day (QID) | ORAL | 0 refills | Status: DC | PRN
  Filled 2024-06-29: qty 180, 10d supply, fill #0

## 2024-06-29 MED ORDER — ALBUTEROL (5 MG/ML) CONTINUOUS INHALATION SOLN
10.0000 mg/h | INHALATION_SOLUTION | Freq: Once | RESPIRATORY_TRACT | Status: AC
  Administered 2024-06-29: 10 mg/h via RESPIRATORY_TRACT
  Filled 2024-06-29: qty 20

## 2024-06-29 MED ORDER — INSULIN GLARGINE-YFGN 100 UNIT/ML ~~LOC~~ SOLN
12.0000 [IU] | Freq: Every day | SUBCUTANEOUS | Status: DC
  Filled 2024-06-29: qty 0.12

## 2024-06-29 MED ORDER — IPRATROPIUM-ALBUTEROL 0.5-2.5 (3) MG/3ML IN SOLN
RESPIRATORY_TRACT | Status: AC
  Administered 2024-06-29: 6 mL via RESPIRATORY_TRACT
  Filled 2024-06-29: qty 6

## 2024-06-29 MED ORDER — GUAIFENESIN ER 600 MG PO TB12
600.0000 mg | ORAL_TABLET | Freq: Two times a day (BID) | ORAL | Status: DC
  Administered 2024-06-29 – 2024-06-30 (×2): 600 mg via ORAL
  Filled 2024-06-29 (×2): qty 1

## 2024-06-29 MED ORDER — TIZANIDINE HCL 4 MG PO TABS: 4.0000 mg | ORAL_TABLET | Freq: Three times a day (TID) | ORAL | Status: DC | PRN

## 2024-06-29 MED ORDER — LEVALBUTEROL HCL 0.63 MG/3ML IN NEBU
0.6300 mg | INHALATION_SOLUTION | Freq: Four times a day (QID) | RESPIRATORY_TRACT | Status: DC | PRN
  Administered 2024-06-30: 0.63 mg via RESPIRATORY_TRACT
  Filled 2024-06-29: qty 3

## 2024-06-29 MED ORDER — FAMOTIDINE 20 MG PO TABS
20.0000 mg | ORAL_TABLET | Freq: Two times a day (BID) | ORAL | Status: DC
  Administered 2024-06-29 – 2024-06-30 (×2): 20 mg via ORAL
  Filled 2024-06-29 (×2): qty 1

## 2024-06-29 MED ORDER — ZOLPIDEM TARTRATE 5 MG PO TABS
5.0000 mg | ORAL_TABLET | Freq: Every evening | ORAL | Status: DC | PRN
  Administered 2024-06-29: 5 mg via ORAL
  Filled 2024-06-29: qty 1

## 2024-06-29 MED ORDER — ONDANSETRON HCL 4 MG/2ML IJ SOLN: 4.0000 mg | Freq: Four times a day (QID) | INTRAMUSCULAR | Status: DC | PRN

## 2024-06-29 MED ORDER — LEVALBUTEROL HCL 0.63 MG/3ML IN NEBU
0.6300 mg | INHALATION_SOLUTION | Freq: Four times a day (QID) | RESPIRATORY_TRACT | Status: DC
  Administered 2024-06-30 (×2): 0.63 mg via RESPIRATORY_TRACT
  Filled 2024-06-29 (×9): qty 3

## 2024-06-29 MED ORDER — PROMETHAZINE-DM 6.25-15 MG/5ML PO SYRP: 5.0000 mL | ORAL_SOLUTION | Freq: Four times a day (QID) | ORAL | Status: DC | PRN

## 2024-06-29 MED ORDER — INSULIN ASPART 100 UNIT/ML IJ SOLN: 0.0000 [IU] | Freq: Every day | INTRAMUSCULAR | Status: DC

## 2024-06-29 MED ORDER — INSULIN ASPART 100 UNIT/ML IJ SOLN
0.0000 [IU] | Freq: Three times a day (TID) | INTRAMUSCULAR | Status: DC
  Administered 2024-06-30: 11 [IU] via SUBCUTANEOUS
  Administered 2024-06-30: 8 [IU] via SUBCUTANEOUS
  Filled 2024-06-29: qty 2
  Filled 2024-06-29: qty 9

## 2024-06-29 MED ORDER — KETOROLAC TROMETHAMINE 15 MG/ML IJ SOLN
15.0000 mg | Freq: Once | INTRAMUSCULAR | Status: AC
  Administered 2024-06-29: 15 mg via INTRAVENOUS
  Filled 2024-06-29: qty 1

## 2024-06-29 MED ORDER — ENOXAPARIN SODIUM 80 MG/0.8ML IJ SOSY
70.0000 mg | PREFILLED_SYRINGE | INTRAMUSCULAR | Status: DC
  Administered 2024-06-29: 70 mg via SUBCUTANEOUS
  Filled 2024-06-29: qty 0.8

## 2024-06-29 MED ORDER — INSULIN ASPART PROT & ASPART (70-30 MIX) 100 UNIT/ML ~~LOC~~ SUSP
8.0000 [IU] | Freq: Once | SUBCUTANEOUS | Status: AC
  Administered 2024-06-29: 8 [IU] via SUBCUTANEOUS
  Filled 2024-06-29: qty 8

## 2024-06-29 MED ORDER — IRBESARTAN 300 MG PO TABS
300.0000 mg | ORAL_TABLET | Freq: Every day | ORAL | Status: DC
  Administered 2024-06-30: 300 mg via ORAL
  Filled 2024-06-29: qty 1

## 2024-06-29 MED ORDER — ACETAMINOPHEN 325 MG PO TABS: 650.0000 mg | ORAL_TABLET | Freq: Four times a day (QID) | ORAL | Status: DC | PRN

## 2024-06-29 MED ORDER — KETOROLAC TROMETHAMINE 15 MG/ML IJ SOLN: 15.0000 mg | Freq: Four times a day (QID) | INTRAMUSCULAR | Status: DC | PRN

## 2024-06-29 MED ORDER — LORAZEPAM 2 MG/ML IJ SOLN
0.5000 mg | Freq: Once | INTRAMUSCULAR | Status: AC
  Administered 2024-06-29: 0.5 mg via INTRAVENOUS
  Filled 2024-06-29: qty 1

## 2024-06-29 MED ORDER — DICYCLOMINE HCL 10 MG PO CAPS: 10.0000 mg | ORAL_CAPSULE | Freq: Two times a day (BID) | ORAL | Status: DC | PRN

## 2024-06-29 MED ORDER — GUAIFENESIN-DM 100-10 MG/5ML PO SYRP
5.0000 mL | ORAL_SOLUTION | Freq: Four times a day (QID) | ORAL | Status: DC | PRN
  Administered 2024-06-29 – 2024-06-30 (×2): 10 mL via ORAL
  Filled 2024-06-29 (×2): qty 10

## 2024-06-29 MED ORDER — IPRATROPIUM-ALBUTEROL 0.5-2.5 (3) MG/3ML IN SOLN: 6.0000 mL | Freq: Once | RESPIRATORY_TRACT | Status: AC

## 2024-06-29 MED ORDER — OXYCODONE HCL 5 MG PO TABS
10.0000 mg | ORAL_TABLET | Freq: Three times a day (TID) | ORAL | Status: DC | PRN
  Administered 2024-06-30: 10 mg via ORAL
  Filled 2024-06-29: qty 2

## 2024-06-29 MED ORDER — METHYLPREDNISOLONE SODIUM SUCC 40 MG IJ SOLR
40.0000 mg | Freq: Two times a day (BID) | INTRAMUSCULAR | Status: DC
  Administered 2024-06-29 – 2024-06-30 (×2): 40 mg via INTRAVENOUS
  Filled 2024-06-29 (×2): qty 1

## 2024-06-29 MED ORDER — PANTOPRAZOLE SODIUM 40 MG PO TBEC
40.0000 mg | DELAYED_RELEASE_TABLET | Freq: Every day | ORAL | Status: DC
  Administered 2024-06-29 – 2024-06-30 (×2): 40 mg via ORAL
  Filled 2024-06-29 (×2): qty 1

## 2024-06-29 MED ORDER — ACETAMINOPHEN 650 MG RE SUPP: 650.0000 mg | Freq: Four times a day (QID) | RECTAL | Status: DC | PRN

## 2024-06-29 MED ORDER — HYDROCODONE-ACETAMINOPHEN 5-325 MG PO TABS: 1.0000 | ORAL_TABLET | ORAL | Status: DC | PRN

## 2024-06-29 MED ORDER — ONDANSETRON HCL 4 MG PO TABS: 4.0000 mg | ORAL_TABLET | Freq: Four times a day (QID) | ORAL | Status: DC | PRN

## 2024-06-29 MED ORDER — INSULIN GLARGINE-YFGN 100 UNIT/ML ~~LOC~~ SOLN
12.0000 [IU] | Freq: Two times a day (BID) | SUBCUTANEOUS | Status: DC
  Administered 2024-06-29 – 2024-06-30 (×2): 12 [IU] via SUBCUTANEOUS
  Filled 2024-06-29 (×3): qty 0.12

## 2024-06-29 MED ORDER — ALBUTEROL SULFATE (2.5 MG/3ML) 0.083% IN NEBU
3.0000 mL | INHALATION_SOLUTION | Freq: Four times a day (QID) | RESPIRATORY_TRACT | 0 refills | Status: DC | PRN
  Filled 2024-06-29: qty 150, 13d supply, fill #0

## 2024-06-29 MED ORDER — IRBESARTAN-HYDROCHLOROTHIAZIDE 300-12.5 MG PO TABS: 1.0000 | ORAL_TABLET | Freq: Every day | ORAL | Status: DC

## 2024-06-29 MED ORDER — SENNOSIDES-DOCUSATE SODIUM 8.6-50 MG PO TABS: 1.0000 | ORAL_TABLET | Freq: Every evening | ORAL | Status: DC | PRN

## 2024-06-29 MED ORDER — MAGNESIUM SULFATE 2 GM/50ML IV SOLN
2.0000 g | Freq: Once | INTRAVENOUS | Status: AC
  Administered 2024-06-29: 2 g via INTRAVENOUS
  Filled 2024-06-29: qty 50

## 2024-06-29 MED ORDER — BUDESONIDE 0.25 MG/2ML IN SUSP
0.2500 mg | Freq: Two times a day (BID) | RESPIRATORY_TRACT | Status: DC
  Administered 2024-06-29 – 2024-06-30 (×2): 0.25 mg via RESPIRATORY_TRACT
  Filled 2024-06-29 (×2): qty 2

## 2024-06-29 MED ORDER — NORETHINDRONE ACETATE 5 MG PO TABS
15.0000 mg | ORAL_TABLET | Freq: Every day | ORAL | Status: DC
  Administered 2024-06-30: 15 mg via ORAL
  Filled 2024-06-29: qty 3

## 2024-06-29 MED ORDER — INSULIN ASPART 100 UNIT/ML IJ SOLN
6.0000 [IU] | Freq: Once | INTRAMUSCULAR | Status: AC
  Administered 2024-06-29: 6 [IU] via SUBCUTANEOUS
  Filled 2024-06-29: qty 6

## 2024-06-29 MED ORDER — HYDROCHLOROTHIAZIDE 12.5 MG PO TABS
12.5000 mg | ORAL_TABLET | Freq: Every day | ORAL | Status: DC
  Administered 2024-06-30: 12.5 mg via ORAL
  Filled 2024-06-29: qty 1

## 2024-06-29 MED ORDER — SODIUM CHLORIDE 0.9 % IV BOLUS
1000.0000 mL | Freq: Once | INTRAVENOUS | Status: AC
  Administered 2024-06-29: 1000 mL via INTRAVENOUS

## 2024-06-29 NOTE — ED Notes (Signed)
 Purple man is green on EPIC.

## 2024-06-29 NOTE — Assessment & Plan Note (Signed)
 Body mass index is 52.92 kg/m. Complicates overall care and prognosis.  Recommend lifestyle modifications including physical activity and diet for weight loss and overall long-term health.

## 2024-06-29 NOTE — Progress Notes (Signed)
 Molly Archer is a 43 y.o. female with medical history significant of hypertension, insulin -dependent type 2 diabetes asthma, GERD, prior spontaneous pneumothorax shortly after cholecystectomy in June of this year.  Patient presented to [facility] ED today for evaluation of persistent shortness of breath and cough.  Patient reports 3 to 4-week history of shortness of breath, cough and wheezing.  She was seen at urgent care on Wednesday and prescribed steroid and Z-Pak.  She took these as prescribed but continued to have persistent symptoms without improvement.  She presented again to urgent care today where she was noted to have increased wheezing and shortness of breath than before.   Patient was admitted by telemedicine/virtual provider earlier today.  H&P is incomplete at the time of this dictation.  Per policy, I was asked by patient placement to lay eyes on this patient being onsite physician.  I saw patient.  She was very comfortable with no acute issues.  Complained of some mild exertional shortness of breath.  I examined thoroughly patient as well.  She had no wheezes on examination.  After encounter was completed, she did complain of some nausea.  She has Zofran  as needed ordered.  I also verified all the orders.  It does not look like patient has received any Solu-Medrol  today in the ED.  She will start tonight with 40 mg IV twice daily along with bronchodilators.  I have reviewed all labs and imaging studies as well.  Patient is stable at this point in time.

## 2024-06-29 NOTE — ED Provider Notes (Signed)
 [facility] [facility] AT [facility] Provider Note   CSN: [id number] Arrival date & time: 06/29/24  9081     Patient presents with: Shortness of Breath   Molly Archer is a 43 y.o. female.   43 year old female presenting with shortness of breath.  Patient notes that she has been sick with a cough for approximately 4 weeks, she was seen at urgent care on 11/25 and treated for bronchitis with azithromycin /prednisone /Phenergan  DM, however her symptoms persist and for the last week she has noted worsening shortness of breath on exertion.  She was again seen urgent care this morning and was told that there were some concerns with her x-ray and was instructed to come to the emergency department.  She has no history of CHF or COPD, she does have a history of asthma that is exacerbated by seasonal changes, she has been using albuterol  and DuoNebs at home with minimal relief of her symptoms.  This morning at urgent care she was given DuoNeb x 2 as well as Decadron  without much improvement in her symptoms.  She notes a productive cough with whitish/yellow sputum production, no fever.  No lower extremity edema, patient does take hormonal medications for treatment of abnormal uterine bleeding.   Shortness of Breath      Prior to Admission medications   Medication Sig Start Date End Date Taking? Authorizing Provider  Accu-Chek Softclix Lancets lancets by Other route. Use as instructed    [provider]  albuterol  (PROVENTIL ) (2.5 MG/3ML) 0.083% nebulizer solution Inhale 3 mLs into the lungs every 6 (six) hours as needed for wheezing. 06/29/24     albuterol  (VENTOLIN  HFA) 108 (90 Base) MCG/ACT inhaler Inhale 2 puffs into the lungs every 4 (four) hours as needed for wheezing or shortness of breath (cough, shortness of breath or wheezing.). 10/14/23   de [place], Quintin PARAS, MD  azithromycin  (ZITHROMAX ) 250 MG tablet Take 2 tablets by mouth on day 1, then 1 tablet by mouth days  2-5 06/23/24     Blood Glucose Monitoring Suppl (BLOOD GLUCOSE MONITOR SYSTEM) w/Device KIT Use as directed in the morning, at noon, and at bedtime. 01/06/24   de [place], Raymond J, MD  Ciclopirox  1 % shampoo Apply 1 each topically at bedtime. 05/08/24   [place], Raymond J, MD  clobetasol  ointment (TEMOVATE ) 0.05 % Apply 1 Application topically 2 (two) times daily. 05/19/24   [place], Raymond J, MD  diclofenac  (VOLTAREN ) 75 MG EC tablet Take 1 tablet (75 mg total) by mouth 2 (two) times daily with a meal. Patient taking differently: Take 75 mg by mouth as needed. 12/26/23   Babs Arthea ONEIDA, MD  dicyclomine  (BENTYL ) 10 MG capsule Take 1 capsule (10 mg total) by mouth 2 (two) times daily as needed for spasms. 04/14/24   Zehr, Jessica D, PA-C  doxazosin  (CARDURA ) 1 MG tablet Take 1 tablet (1 mg total) by mouth daily. 04/24/24   [place], Quintin PARAS, MD  famotidine  (PEPCID ) 20 MG tablet Take 1 tablet (20 mg total) by mouth 2 (two) times daily. 04/08/24     Fluocinolone  Acetonide Scalp 0.01 % OIL Apply topically.    [provider]  Fluocinolone  Acetonide Scalp 0.01 % OIL Apply 1 Application topically 2 (two) times a week. 05/11/24   [place], Quintin PARAS, MD  Glucose Blood (BLOOD GLUCOSE TEST STRIPS 333 VI) 100 each by In Vitro route 2 (two) times daily before a meal.    [provider]  hydrochlorothiazide  (MICROZIDE ) 12.5 MG capsule Take 1 capsule (12.5 mg total) by mouth daily. 06/10/24   [place], Quintin PARAS, MD  insulin  glargine (LANTUS  SOLOSTAR) 100 UNIT/ML Solostar Pen Inject 12 Units into the skin daily. 06/02/24   de [place], Raymond J, MD  Insulin  Pen Needle (PEN NEEDLES) 32G X 4 MM MISC Use as directed for injecting insulin  01/08/24   [place], Quintin PARAS, MD  irbesartan -hydrochlorothiazide  (AVALIDE) 300-12.5 MG tablet Take 1 tablet by mouth daily. 06/10/24   [place], Quintin PARAS, MD  lidocaine  (XYLOCAINE ) 2 % solution Use as directed 15 mLs in the mouth or throat every 4 (four) hours as needed.  12/09/23     methylPREDNISolone  (MEDROL  DOSEPAK) 4 MG TBPK tablet Take as directed 04/17/24   Sikora, Rebecca, DPM  metoCLOPramide  (REGLAN ) 10 MG tablet Take 1 tablet (10 mg total) by mouth every 8 (eight) hours as needed for Nausea 05/19/24   [place], Quintin PARAS, MD  misoprostol (CYTOTEC) 200 MCG tablet SMARTSIG:1 Tablet(s) By Mouth 03/02/24   [provider]  Naftifine  HCl 2 % CREA Apply liberally to affected area daily 05/19/24   [place], Quintin PARAS, MD  Naphazoline-Pheniramine (ALLERGY EYE OP) Place 1 drop into both eyes daily as needed (allergies).    [provider]  nitroGLYCERIN  (NITRODUR - DOSED IN MG/24 HR) 0.1 mg/hr patch Place 1 patch (0.1 mg total) onto the skin daily. 03/11/24   Joya Stabs, DPM  norethindrone  (AYGESTIN ) 5 MG tablet Take 3 tablets (15 mg total) by mouth daily. 10/22/18   Anyanwu, Ugonna A, MD  Oxycodone  HCl 10 MG TABS Take 1 tablet (10 mg total) by mouth every 8 (eight) hours as needed. 06/10/24   Babs Arthea DASEN, MD  pantoprazole  (PROTONIX ) 40 MG tablet Take 1 tablet (40 mg total) by mouth daily. 01/13/24   McMichael, Nestor HERO, PA-C  predniSONE  (DELTASONE ) 20 MG tablet Take 2 tablets (40 mg total) by mouth daily for 5 days. 06/23/24     promethazine -dextromethorphan  (PROMETHAZINE -DM) 6.25-15 MG/5ML syrup Take 5-10 mLs by mouth every 6 (six) hours as needed for cough mainly at bedtime.  Do not take and drive. 06/29/24     sucralfate  (CARAFATE ) 1 g tablet Take 1 tablet (1 g total) by mouth 4 (four) times daily. 04/08/24     tiZANidine  (ZANAFLEX ) 4 MG tablet Take 1 tablet (4 mg total) by mouth every 8 (eight) hours as needed for muscle spasms. 06/10/24   de [place], Raymond J, MD  triamcinolone  cream (KENALOG ) 0.1 % Apply 1 Application topically 2 (two) times daily. 05/19/24   de [place], Quintin PARAS, MD  zolpidem  (AMBIEN ) 5 MG tablet Take 1 tablet (5 mg total) by mouth at bedtime as needed for sleep. 06/10/24   [place], Quintin PARAS, MD    Allergies: Codeine,  Doxycycline , Losartan, Penicillins, and Tramadol     Review of Systems  Respiratory:  Positive for shortness of breath.     Updated Vital Signs  Vitals:   06/29/24 1436 06/29/24 1500 06/29/24 1530 06/29/24 1600  BP:  (!) 143/60 135/62 (!) 154/77  Pulse:  (!) 109 (!) 109 (!) 111  Resp:  (!) 22 19 15   Temp: 99 F (37.2 C)     TempSrc: Oral     SpO2:  95% 96% 97%  Weight:      Height:         Physical Exam Vitals and nursing note reviewed.  HENT:     Head: Normocephalic.  Eyes:  Extraocular Movements: Extraocular movements intact.  Cardiovascular:     Rate and Rhythm: Regular rhythm. Tachycardia present.     Pulses:          Dorsalis pedis pulses are 2+ on the right side and 2+ on the left side.  Pulmonary:     Breath sounds: Wheezing and rales present.     Comments: Tachypnea, diffuse expiratory wheeze throughout, Rales in bilateral lung bases, able to speak in full/clear sentences Abdominal:     Palpations: Abdomen is soft.     Tenderness: There is no abdominal tenderness. There is no guarding.  Musculoskeletal:     Cervical back: Normal range of motion.     Right lower leg: No edema.     Left lower leg: No edema.     Comments: Moves all extremities spontaneously without difficulty  Skin:    General: Skin is warm and dry.  Neurological:     Mental Status: She is alert and oriented to person, place, and time.     (all labs ordered are listed, but only abnormal results are displayed) Labs Reviewed  BASIC METABOLIC PANEL WITH GFR - Abnormal; Notable for the following components:      Result Value   Chloride 96 (*)    Glucose, Bld 245 (*)    Anion gap 17 (*)    All other components within normal limits  CBC - Abnormal; Notable for the following components:   WBC 13.3 (*)    RBC 5.16 (*)    All other components within normal limits  I-STAT VENOUS BLOOD GAS, ED - Abnormal; Notable for the following components:   pH, Ven 7.460 (*)    pCO2, Ven 33.8 (*)     pO2, Ven 63 (*)    Sodium 134 (*)    Potassium 3.4 (*)    Calcium, Ion 1.13 (*)    Hemoglobin 15.3 (*)    All other components within normal limits  CBG MONITORING, ED - Abnormal; Notable for the following components:   Glucose-Capillary 349 (*)    All other components within normal limits  RESP PANEL BY RT-PCR (RSV, FLU A&B, COVID)  RVPGX2  PREGNANCY, URINE  PRO BRAIN NATRIURETIC PEPTIDE  D-DIMER, QUANTITATIVE  TROPONIN T, HIGH SENSITIVITY  TROPONIN T, HIGH SENSITIVITY  TROPONIN T, HIGH SENSITIVITY    EKG: None  Radiology: DG Chest 2 View Result Date: 06/29/2024 CLINICAL DATA:  shortness of breath, wheezing EXAM: CHEST - 2 VIEW COMPARISON:  January 13, 2024 FINDINGS: No focal airspace consolidation, pleural effusion, or pneumothorax. No cardiomegaly.No acute fracture or destructive lesion. Multilevel thoracic osteophytosis. IMPRESSION: No acute cardiopulmonary abnormality. Electronically Signed   By: Rogelia Myers M.D.   On: 06/29/2024 11:17     Procedures   Medications Ordered in the ED  ipratropium-albuterol  (DUONEB) 0.5-2.5 (3) MG/3ML nebulizer solution 6 mL (6 mLs Nebulization Given 06/29/24 0954)  sodium chloride  0.9 % bolus 1,000 mL ( Intravenous Stopped 06/29/24 1214)  albuterol  (PROVENTIL ,VENTOLIN ) solution continuous neb (10 mg/hr Nebulization Given 06/29/24 1223)  magnesium  sulfate IVPB 2 g 50 mL (0 g Intravenous Stopped 06/29/24 1418)  ketorolac  (TORADOL ) 15 MG/ML injection 15 mg (15 mg Intravenous Given 06/29/24 1432)  LORazepam  (ATIVAN ) injection 0.5 mg (0.5 mg Intravenous Given 06/29/24 1432)  insulin  aspart protamine- aspart (NOVOLOG  MIX 70/30) injection 8 Units (8 Units Subcutaneous Given 06/29/24 1527)  Medical Decision Making This patient presents to the ED for concern of shortness of breath, this involves an extensive number of treatment options, and is a complaint that carries with it a high risk of complications and morbidity.   The differential diagnosis includes asthma exacerbation, acute onset of CHF, pneumonia, acute hypoxic respiratory failure   Co morbidities that complicate the patient evaluation  Asthma, diabetes   Additional history obtained:  Additional history obtained from record review External records from outside source obtained and reviewed including recent to urgent care notes   Lab Tests:  I Ordered, and personally interpreted labs.  The pertinent results include: CBC notable for leukocytosis with white blood cell count of 13.3, otherwise unremarkable.  BMP notable for hyperglycemia and anion gap of 17.  proBNP within normal limits, D-dimer within normal limits.  COVID/flu/RSV negative. Troponin < 15.  VBG notable for alkalosis with pH of 7.46, pCO2 is low at 33.8, bicarb within normal limits, pattern consistent with respiratory alkalosis that is likely secondary to tachypnea.  Repeat CBG shows continued hyperglycemia at 349.   Imaging Studies ordered:  I ordered imaging studies including CXR I independently visualized and interpreted imaging which showed No acute cardiopulmonary abnormality.  I agree with the radiologist interpretation   Cardiac Monitoring: / EKG:  The patient was maintained on a cardiac monitor.  I personally viewed and interpreted the cardiac monitored which showed an underlying rhythm of: NSR   Consultations Obtained:  I requested consultation with the hospitalist,  and discussed lab and imaging findings as well as pertinent plan - they recommend: I spoke with Dr. Fausto who agrees that this patient is appropriate for admission for a suspected asthma exacerbation that has not improved despite multiple measures as noted below   Problem List / ED Course / Critical interventions / Medication management   I ordered medication including DuoNeb x 2  for wheezing/shortness of breath, continuous albuterol  treatment for the same, magnesium  for the same, IVF for  hyperglycemia, Toradol  and Ativan  for pain/anxiety Reevaluation of the patient after these medicines showed that the patient improved I have reviewed the patients home medicines and have made adjustments as needed   Social Determinants of Health:  Housing/financial instability   Test / Admission - Considered:  Physical exam is notable as above, patient does have diffuse expiratory wheeze throughout with associated Rales, she is mildly tachypneic but is able to speak in full/clear sentences.  Patient given DuoNeb x 2 at urgent care this morning and repeated here as well, patient also given Decadron  at urgent care, she has been on course of azithromycin /prednisone , use of oral corticosteroids may be contributing to her hyperglycemia and leukocytosis as above.  CXR done at [facility] this morning is notable as follows: 1. There is no evidence of acute cardiac or pulmonary abnormality.  2. Cardiomegaly and pulmonary vascular congestion.  Chest x-ray repeated here today is notable as above. At time of my reassessment, patient has almost complete resolution of diffuse expiratory wheeze, still endorses feeling somewhat short of breath as well as tightness in her chest.  Initial troponin is within normal limits, I do not feel that repeat troponin is necessary at this time. At time of Dr. Jonda evaluation, patient did have return of wheezing.  Will initiate continuous albuterol  treatment x 1 hour and reassess.  Patient is maintaining her oxygen saturation on room air without difficulty, patient does not wish to be admitted to the hospital for further observation at this time. Patient became  tachycardic with associated chest tightness midway through her continuous albuterol  treatment, she was tachycardic in the 140s which I suspect is due to recurrent use of albuterol  at urgent care and in the emergency department today, second troponin was collected at this time and was found to remain within normal limits.   Patient given Toradol  for pain and Ativan  for anxiety, symptoms improved after this although heart rate does remain borderline tachycardic in the low 100s. Patient continues to maintain her oxygen saturation on room air, however she continues to have chest tightness with wheezing despite use of treatments as noted above.  Low suspicion for conditions like ACS, acute CHF, or PE given reassuring workup as above.  Given that her symptoms continue to persist I do not feel that she would be safe for discharge at this time, I spoke with the hospitalist service above who agrees that this patient is appropriate for admission.  Staffed with Dr. Rogelia who independently evaluated this patient at the bedside  Amount and/or Complexity of Data Reviewed Labs: ordered. Radiology: ordered. ECG/medicine tests: ordered.  Risk Prescription drug management. Decision regarding hospitalization.        Final diagnoses:  Exacerbation of asthma, unspecified asthma severity, unspecified whether persistent    ED Discharge Orders     None          Glendia Rocky SAILOR, [place] 06/29/24 1707    Rogelia Jerilynn RAMAN, MD 07/07/24 1213

## 2024-06-29 NOTE — ED Notes (Signed)
 Virtual hospitalist at bedside

## 2024-06-29 NOTE — ED Notes (Signed)
 MD at bedside.

## 2024-06-29 NOTE — ED Notes (Signed)
 Pt called out reporting pulling chest pain since the albuterol  started. Pt tearful and reporting the pain is worsening. PA at bedside.

## 2024-06-29 NOTE — Assessment & Plan Note (Signed)
 Patient has history of lumbar radiculopathy and cervical spondylosis. -- Resume home regimen with oxycodone , Zanaflex 

## 2024-06-29 NOTE — Assessment & Plan Note (Signed)
 Resume home basal insulin  12 units daily. Moderate sliding scale NovoLog  Titrate regimen for inpatient goal 140-180 Monitor for steroid-induced hyperglycemia

## 2024-06-29 NOTE — Assessment & Plan Note (Addendum)
 Resumed on home pain regimen Encourage mobilization and out of bed to chair

## 2024-06-29 NOTE — H&P (Addendum)
 Telemedicine History and Physical    Patient: Molly Archer FMW:[id number] DOB: 1980-11-27 DOA: 06/29/2024 DOS: the patient was seen and examined on 06/29/2024 PCP: de [place], Quintin PARAS, MD    Referring Provider: Rocky Hamilton, PA-C Telemedicine Provider: Burnard Cunning, DO Patient Location: [facility] Referring Diagnosis: Acute asthma exacerbation Patient Name and DOB verified: Molly Archer, 1981-07-22 Patient consented to Telemedicine Evaluation:Yes RN virtual assistant: Tillman Heading Video encounter time and date: 06/29/2024 1530   Patient coming from: Home  Chief Complaint:  Chief Complaint  Patient presents with   Shortness of Breath   HPI: [name] is a 43 y.o. female with medical history significant of hypertension, insulin -dependent type 2 diabetes asthma, GERD, prior spontaneous pneumothorax shortly after cholecystectomy in June of this year.  [facility] today for evaluation of persistent shortness of breath and cough.  Patient reports 3 to 4-week history of shortness of breath, cough and wheezing.  She was seen at urgent care on Wednesday and prescribed steroid and Z-Pak.  She took these as prescribed but continued to have persistent symptoms without improvement.  She presented again to urgent care today where she was noted to have increased wheezing and shortness of breath than before.  Patient reports feeling like she is gasping for air today, no relief from inhalers or neb treatments.  Patient reports cough is mostly dry, having increased postnasal drainage and scratchy throat.  Reports intermittent chills but no fever.  Patient reports history of a collapsed right lung that occurred shortly after cholecystectomy in June of this year.  She was admitted to the hospital and required a chest tube.  She reports the only chest pain she has experienced recently is after the fourth nebulizer treatment today when she became tachycardic.   She otherwise denies recent symptoms including abdominal pain, nausea, vomiting, diarrhea, dysuria or hematuria, unilateral weakness numbness tingling or headaches.  ED course -- Initial vitals afebrile temp 98.3 F, heart rate 106 (up to 142 after neb treatments), respiratory rate variable from 14-24, initial BP 165/80, SpO2 95% on room air. Labs obtained including BMP, CBC were notable for nonfasting glucose 245, chloride 96, WBC 13.3. D-dimer was negative.  VBG showed pH 7.46, pCO2 33.8 ruling out hypercarbia, pCO2 is 63. Troponin was negative x 3.  proBNP less than 50. Viral PCR is negative for COVID, flu A/B, RSV. Imaging-- 2 view chest x-ray was obtained and negative for acute findings including infiltrates to suggest pneumonia and no pneumothorax. EKG showed normal sinus rhythm at 99 bpm without acute ischemic changes.  Patient was treated in the ED with multiple neb treatments followed by continuous neb but had persistent tachypnea.  She was also given IV Ativan, IV Toradol , IV mag sulfate, 1 L bolus IV fluids.  Patient is admitted to telemetry as observation and being started on IV steroids, nebulized bronchodilators and supportive care as outlined in detail below.   Review of Systems: As mentioned in the history of present illness. All other systems reviewed and are negative.  Past Medical History:  Diagnosis Date   Allergy    Anemia    pt states she took iron for this around 2019, and has had no issues since   Arthritis    knees   Asthma    only uses inhaler in fall and spring   Constipation    Diabetes mellitus without complication (HCC)    GERD (gastroesophageal reflux disease)    diet controlled,  occ. uses omeprazole    Headache    otc med prn   Heart murmur    History of kidney stones    Hx of Bell's palsy 12/2011   Hypertension    Menometrorrhagia    Nausea and vomiting 04/14/2024   Psoriasis    Shortness of breath dyspnea    with exercise/exertion   Vitamin D   deficiency    Past Surgical History:  Procedure Laterality Date   CHOLECYSTECTOMY N/A 01/01/2024   Procedure: LAPAROSCOPIC CHOLECYSTECTOMY;  Surgeon: Tanda Locus, MD;  Location: [facility] OR;  Service: General;  Laterality: N/A;   COLONOSCOPY N/A 05/06/2024   Procedure: COLONOSCOPY;  Surgeon: Abran Norleen SAILOR, MD;  Location: [facility];  Service: Gastroenterology;  Laterality: N/A;   COMBINED HYSTEROSCOPY DIAGNOSTIC / D&C N/A 07/30/2009   Benign secretory endometrium   CYSTOSCOPY WITH STENT PLACEMENT Left 10/19/2014   Procedure: CYSTOSCOPY WITH STENT PLACEMENT;  Surgeon: Morene LELON Salines, MD;  Location: [facility];  Service: Urology;  Laterality: Left;   DILATION AND CURETTAGE OF UTERUS  10/29/2003   Polyp, mild atypia, simple and complex hyperplasia,   DILATION AND CURETTAGE OF UTERUS  11/28/2003   Simple and complex hyperplasia   DILATION AND CURETTAGE OF UTERUS  07/30/2009   DILATION AND CURETTAGE OF UTERUS N/A 12/14/2014   Procedure: DILATATION AND CURETTAGE;  Surgeon: Winton Felt, MD;  Location: [facility];  Service: Gynecology;  Laterality: N/A;   ESOPHAGOGASTRODUODENOSCOPY N/A 05/06/2024   Procedure: EGD (ESOPHAGOGASTRODUODENOSCOPY);  Surgeon: Abran Norleen SAILOR, MD;  Location: [facility];  Service: Gastroenterology;  Laterality: N/A;   HOLMIUM LASER APPLICATION Left 10/19/2014   Procedure: HOLMIUM LASER APPLICATION;  Surgeon: Morene LELON Salines, MD;  Location: [facility];  Service: Urology;  Laterality: Left;   HYSTEROSCOPY WITH D & C N/A 07/21/2018   Procedure: DILATATION AND CURETTAGE /HYSTEROSCOPY;  Surgeon: Eveline Lynwood MATSU, MD;  Location: [facility];  Service: Gynecology;  Laterality: N/A;   INTRAUTERINE DEVICE (IUD) INSERTION N/A 12/14/2014   Procedure: INTRAUTERINE DEVICE (IUD) INSERTION;  Surgeon: Winton Felt, MD;  Location: [facility];  Service: Gynecology;  Laterality: N/A;   KNEE CARTILAGE SURGERY Left    STONE EXTRACTION WITH BASKET Left 10/19/2014   Procedure: STONE EXTRACTION WITH BASKET;   Surgeon: Morene LELON Salines, MD;  Location: [facility];  Service: Urology;  Laterality: Left;   WISDOM TOOTH EXTRACTION     Social History:  reports that she has never smoked. She has never been exposed to tobacco smoke. She has never used smokeless tobacco. She reports that she does not drink alcohol and does not use drugs.  Allergies  Allergen Reactions   Codeine Hives   Doxycycline  Nausea Only    Pt states causes nausea and vomiting.   Losartan Other (See Comments)    headache   Penicillins Hives   Tramadol  Hives    Family History  Problem Relation Age of Onset   Hypertension Mother    Hyperlipidemia Mother    Diabetes Mother    Hypertension Father    Diabetes Father    Stroke Maternal Grandmother    Cancer Maternal Grandfather    Hypertension Other    Diabetes Other    Cancer Other    Cancer Maternal Aunt    Diabetes Maternal Aunt    Diabetes Maternal Uncle    Liver disease Neg Hx    Colon cancer Neg Hx    Esophageal cancer Neg Hx     Prior to Admission medications   Medication Sig Start Date  End Date Taking? Authorizing Provider  Accu-Chek Softclix Lancets lancets by Other route. Use as instructed    [provider]  albuterol  (PROVENTIL ) (2.5 MG/3ML) 0.083% nebulizer solution Inhale 3 mLs into the lungs every 6 (six) hours as needed for wheezing. 06/29/24     albuterol  (VENTOLIN  HFA) 108 (90 Base) MCG/ACT inhaler Inhale 2 puffs into the lungs every 4 (four) hours as needed for wheezing or shortness of breath (cough, shortness of breath or wheezing.). 10/14/23   de [place], Raymond J, MD  azithromycin  (ZITHROMAX ) 250 MG tablet Take 2 tablets by mouth on day 1, then 1 tablet by mouth days 2-5 06/23/24     Blood Glucose Monitoring Suppl (BLOOD GLUCOSE MONITOR SYSTEM) w/Device KIT Use as directed in the morning, at noon, and at bedtime. 01/06/24   de [place], Raymond J, MD  Ciclopirox  1 % shampoo Apply 1 each topically at bedtime. 05/08/24   de [place], Raymond J, MD  clobetasol   ointment (TEMOVATE ) 0.05 % Apply 1 Application topically 2 (two) times daily. 05/19/24   de [place], Raymond J, MD  diclofenac  (VOLTAREN ) 75 MG EC tablet Take 1 tablet (75 mg total) by mouth 2 (two) times daily with a meal. Patient taking differently: Take 75 mg by mouth as needed. 12/26/23   Babs Arthea DASEN, MD  dicyclomine  (BENTYL ) 10 MG capsule Take 1 capsule (10 mg total) by mouth 2 (two) times daily as needed for spasms. 04/14/24   Zehr, Jessica D, PA-C  doxazosin  (CARDURA ) 1 MG tablet Take 1 tablet (1 mg total) by mouth daily. 04/24/24   de [place], Quintin PARAS, MD  famotidine  (PEPCID ) 20 MG tablet Take 1 tablet (20 mg total) by mouth 2 (two) times daily. 04/08/24     Fluocinolone  Acetonide Scalp 0.01 % OIL Apply topically.    [provider]  Fluocinolone  Acetonide Scalp 0.01 % OIL Apply 1 Application topically 2 (two) times a week. 05/11/24   de [place], Quintin PARAS, MD  Glucose Blood (BLOOD GLUCOSE TEST STRIPS 333 VI) 100 each by In Vitro route 2 (two) times daily before a meal.    [provider]  hydrochlorothiazide  (MICROZIDE ) 12.5 MG capsule Take 1 capsule (12.5 mg total) by mouth daily. 06/10/24   de [place], Raymond J, MD  insulin  glargine (LANTUS  SOLOSTAR) 100 UNIT/ML Solostar Pen Inject 12 Units into the skin daily. 06/02/24   de [place], Raymond J, MD  Insulin  Pen Needle (PEN NEEDLES) 32G X 4 MM MISC Use as directed for injecting insulin  01/08/24   de [place], Quintin PARAS, MD  irbesartan -hydrochlorothiazide  (AVALIDE) 300-12.5 MG tablet Take 1 tablet by mouth daily. 06/10/24   de [place], Quintin PARAS, MD  lidocaine  (XYLOCAINE ) 2 % solution Use as directed 15 mLs in the mouth or throat every 4 (four) hours as needed. 12/09/23     methylPREDNISolone  (MEDROL  DOSEPAK) 4 MG TBPK tablet Take as directed 04/17/24   Sikora, Rebecca, DPM  metoCLOPramide  (REGLAN ) 10 MG tablet Take 1 tablet (10 mg total) by mouth every 8 (eight) hours as needed for Nausea 05/19/24   de [place], Quintin PARAS, MD  misoprostol (CYTOTEC)  200 MCG tablet SMARTSIG:1 Tablet(s) By Mouth 03/02/24   [provider]  Naftifine  HCl 2 % CREA Apply liberally to affected area daily 05/19/24   de [place], Quintin PARAS, MD  Naphazoline-Pheniramine (ALLERGY EYE OP) Place 1 drop into both eyes daily as needed (allergies).    [provider]  nitroGLYCERIN  (NITRODUR - DOSED IN MG/24 HR) 0.1 mg/hr patch  Place 1 patch (0.1 mg total) onto the skin daily. 03/11/24   Sikora, Rebecca, DPM  norethindrone  (AYGESTIN ) 5 MG tablet Take 3 tablets (15 mg total) by mouth daily. 10/22/18   Anyanwu, Ugonna A, MD  Oxycodone  HCl 10 MG TABS Take 1 tablet (10 mg total) by mouth every 8 (eight) hours as needed. 06/10/24   Babs Arthea DASEN, MD  pantoprazole  (PROTONIX ) 40 MG tablet Take 1 tablet (40 mg total) by mouth daily. 01/13/24   McMichael, Nestor HERO, PA-C  predniSONE  (DELTASONE ) 20 MG tablet Take 2 tablets (40 mg total) by mouth daily for 5 days. 06/23/24     promethazine -dextromethorphan (PROMETHAZINE -DM) 6.25-15 MG/5ML syrup Take 5-10 mLs by mouth every 6 (six) hours as needed for cough mainly at bedtime.  Do not take and drive. 06/29/24     sucralfate  (CARAFATE ) 1 g tablet Take 1 tablet (1 g total) by mouth 4 (four) times daily. 04/08/24     tiZANidine  (ZANAFLEX ) 4 MG tablet Take 1 tablet (4 mg total) by mouth every 8 (eight) hours as needed for muscle spasms. 06/10/24   de [place], Raymond J, MD  triamcinolone  cream (KENALOG ) 0.1 % Apply 1 Application topically 2 (two) times daily. 05/19/24   de [place], Quintin PARAS, MD  zolpidem  (AMBIEN ) 5 MG tablet Take 1 tablet (5 mg total) by mouth at bedtime as needed for sleep. 06/10/24   de [place], Raymond J, MD    Physical Exam: Vitals:   06/29/24 1630 06/29/24 1700 06/29/24 1710 06/29/24 1823  BP: 137/81 131/60    Pulse: (!) 107 (!) 104  100  Resp: 19 18    Temp:   99.1 F (37.3 C)   TempSrc:   Oral Oral  SpO2: 96% 99%    Weight:      Height:       Bedside physical exam was performed by RN. Exam findings below  include both bedside findings and observations based on virtual encounter.  General exam: awake, alert, no acute distress HEENT: moist mucus membranes, hearing grossly normal  Respiratory system: Inspiratory wheezing and left-sided rales Cardiovascular system: normal S1/S2, tachycardic, regular rhythm, trace bilateral lower extremity edema.   Gastrointestinal system: soft, NT, ND Central nervous system: A&O x 3. no gross focal neurologic deficits, normal speech Skin: Bedside RN reports no rashes on visualized skin Psychiatry: normal mood, congruent affect, judgement and insight appear normal    Data Reviewed:  Labs and imagingas outlined in detail above    Assessment and Plan: * Acute asthma exacerbation Patient presents with 3 to 4-week history of shortness of breath, wheezing, cough.  Treated with course of steroid and Z-Pak from urgent care last Wednesday without improvement in symptoms.  Patient remained tachypneic after treatment in the ED including continuous nebulizer.  Chest x-ray was negative.  Viral PCR is negative for COVID flu A/B and RSV. --Admit to telemetry --Start IV Solu-Medrol  40 mg twice daily --Scheduled and as needed Xopenex nebs --Pulmicort nebs --Antitussives and mucolytic's as needed --Monitor respiratory status and oxygenation closely --Patient O2 sats are stable on room air, supplement O2 if sats below 90% --  Chronic prescription opiate use Patient has history of lumbar radiculopathy and cervical spondylosis. -- Resume home regimen with oxycodone , Zanaflex   Insulin  dependent type 2 diabetes mellitus (HCC) Resume home basal insulin  12 units daily. Moderate sliding scale NovoLog  Titrate regimen for inpatient goal 140-180 Monitor for steroid-induced hyperglycemia  Essential hypertension Med history not yet completed but appears patient takes irbesartan -HCTZ --resumed --Confirm  home regimen when med history is complete  Gastroesophageal reflux  disease Continue Protonix  and Pepcid   -- Confirm home regimen when med history is complete  Lumbar radiculopathy Resumed on home pain regimen Encourage mobilization and out of bed to chair  Class 3 severe obesity with serious comorbidity and body mass index (BMI) of 50.0 to 59.9 in adult [facility]) Body mass index is 52.92 kg/m. Complicates overall care and prognosis.  Recommend lifestyle modifications including physical activity and diet for weight loss and overall long-term health.      Advance Care Planning: CODE STATUS: Full code  Consults: None  Family Communication: Patient's mother was present at bedside during the virtual admission encounter  Severity of Illness: The appropriate patient status for this patient is OBSERVATION. Observation status is judged to be reasonable and necessary in order to provide the required intensity of service to ensure the patient's safety. The patient's presenting symptoms, physical exam findings, and initial radiographic and laboratory data in the context of their medical condition is felt to place them at decreased risk for further clinical deterioration. Furthermore, it is anticipated that the patient will be medically stable for discharge from the hospital within 2 midnights of admission.   Author: Burnard DELENA Cunning, DO 06/29/2024 7:30 PM  For on call review [email or web address].

## 2024-06-29 NOTE — ED Triage Notes (Addendum)
 SOB x 4 weeks. Treated for bronchitis. Symptoms persist. Had a CXR today. Audible wheezing. Had albuterol  x 2 at [facility]

## 2024-06-29 NOTE — Assessment & Plan Note (Signed)
 Patient presents with 3 to 4-week history of shortness of breath, wheezing, cough.  Treated with course of steroid and Z-Pak from urgent care last Wednesday without improvement in symptoms.  Patient remained tachypneic after treatment in the ED including continuous nebulizer.  Chest x-ray was negative.  Viral PCR is negative for COVID flu A/B and RSV. --Admit to telemetry --Start IV Solu-Medrol  40 mg twice daily --Scheduled and as needed Xopenex nebs --Pulmicort nebs --Antitussives and mucolytic's as needed --Monitor respiratory status and oxygenation closely --Patient O2 sats are stable on room air, supplement O2 if sats below 90% --

## 2024-06-29 NOTE — Assessment & Plan Note (Signed)
 Med history not yet completed but appears patient takes irbesartan -HCTZ --resumed --Confirm home regimen when med history is complete

## 2024-06-29 NOTE — Assessment & Plan Note (Signed)
 Continue Protonix  and Pepcid   -- Confirm home regimen when med history is complete

## 2024-06-29 NOTE — Progress Notes (Signed)
 Subjective Patient ID: Molly Archer is a 43 y.o. female.  Chief Complaint  Patient presents with  . Cough    Patient complaining of cough, congestion, wheezing for one month now. Patient was seen for same on 11/25, no improvement. Patient was diagnosed with bronchitis, has finished all medications with no improvement. Shortness of breath with exertion. No known fevers.     The following information was reviewed by members of the visit team:  Tobacco  Allergies  Meds  Med Hx  Surg Hx  OB Status  Fam Hx  Soc  Hx     43 yo F with one month history of cough, congestion, wheezing and chest tightness. Cough has been productive of sputum. Reports tightness in chest and shortness of breath with exertion. She does have history of asthma and has been using nebulizer and inhaler at home without much relief. Was seen in this facility 11/25 for same complaint and prescribed azithromycin , phenergan  dm and prednisone . She feels symptoms are no better. No recent fever, chills, nausea, vomiting. No chest pain or discomfort.     Review of Systems  Constitutional:  Negative for chills and fever.  HENT:  Negative for congestion, ear pain, sinus pressure, sinus pain and sore throat.   Respiratory:  Positive for cough, chest tightness, shortness of breath and wheezing.   Cardiovascular:  Negative for chest pain and palpitations.  Gastrointestinal:  Negative for diarrhea, nausea and vomiting.  Musculoskeletal:  Negative for arthralgias and myalgias.  Skin:  Negative for rash.  Neurological:  Negative for dizziness and headaches.  Hematological:  Negative for adenopathy. Does not bruise/bleed easily.    Objective Physical Exam Vitals reviewed.  Constitutional:      General: She is not in acute distress.    Appearance: Normal appearance. She is not ill-appearing, toxic-appearing or diaphoretic.  HENT:     Right Ear: Tympanic membrane, ear canal and external ear normal.     Left Ear:  Tympanic membrane, ear canal and external ear normal.     Nose: Congestion present. No rhinorrhea.     Mouth/Throat:     Mouth: Mucous membranes are moist.     Pharynx: Posterior oropharyngeal erythema present. No oropharyngeal exudate.  Eyes:     Conjunctiva/sclera: Conjunctivae normal.  Cardiovascular:     Rate and Rhythm: Normal rate.     Pulses: Normal pulses.  Pulmonary:     Effort: Pulmonary effort is normal. No respiratory distress.     Breath sounds: No stridor. Wheezing (moderate) present. No rhonchi.  Musculoskeletal:     Cervical back: Normal range of motion.  Skin:    General: Skin is warm and dry.  Neurological:     Mental Status: She is alert and oriented to person, place, and time.  Psychiatric:        Mood and Affect: Mood normal.    XR Chest 2 Views  Final Result by Eduard Vikki Guppy, MD [id number])  XR CHEST 2 VIEWS, 06/29/2024 8:01 AM    INDICATION: cough x 1 week, Acute cough \ R05.1 Acute cough \ R06.2   Wheezing   cough x 1 week  COMPARISON: Chest radiograph 09/14/2023    FINDINGS:     Cardiovascular: Cardiomegaly with pulmonary vascular congestion.  Mediastinum: Within normal limits.  Lungs/pleura: Clear. No pleural effusion or pneumothorax.  Upper abdomen: Visualized portions are unremarkable.   Chest wall/osseous structures: Unremarkable.      IMPRESSION:  1.  There is no evidence of acute  cardiac or pulmonary abnormality.  2.  Cardiomegaly and pulmonary vascular congestion.        Assessment/Plan  43 yo F with history of asthma and persistent cough and wheezing x 1 month. Recently taken course of azithromycin  and prednisone  without relief.  DDX: pneumonia, bronchitis, asthma, viral URI, chf, PE Patient initially with moderate wheezing on exam Duoneb and dexamethasone  IM given in clinic Patient states symptoms not much improved. Additional duoneb given in clinic. Minimal improvement Xray chest shows cardiomegaly and pulmonary vascular  congestion, no obvious pneumonia Given minimal response to nebs/steroids and apparent vascular congestion on exam, concern for chf or similar as cause of symptoms. No prior history of same per patient.  Patient remains short of breath and somewhat tachypnic, O2 sats at 98% on room air.  Given persistent shortness of breath and xray findings, recommend further evaluation and management in ER  particularly if symptoms do not improve. Otherwise, follow with PCP Urgent Care Disposition:  Home Care   Electronically signed: Franky Floria Finder, PA-C 06/29/2024  7:21 AM

## 2024-06-30 ENCOUNTER — Ambulatory Visit (HOSPITAL_BASED_OUTPATIENT_CLINIC_OR_DEPARTMENT_OTHER): Payer: Self-pay | Admitting: Family Medicine

## 2024-06-30 ENCOUNTER — Other Ambulatory Visit (HOSPITAL_BASED_OUTPATIENT_CLINIC_OR_DEPARTMENT_OTHER): Payer: Self-pay

## 2024-06-30 ENCOUNTER — Other Ambulatory Visit (HOSPITAL_COMMUNITY): Payer: Self-pay

## 2024-06-30 DIAGNOSIS — J4521 Mild intermittent asthma with (acute) exacerbation: Secondary | ICD-10-CM | POA: Diagnosis not present

## 2024-06-30 LAB — CBC
HCT: 41.3 % (ref 36.0–46.0)
Hemoglobin: 14 g/dL (ref 12.0–15.0)
MCH: 28.2 pg (ref 26.0–34.0)
MCHC: 33.9 g/dL (ref 30.0–36.0)
MCV: 83.3 fL (ref 80.0–100.0)
Platelets: 361 K/uL (ref 150–400)
RBC: 4.96 MIL/uL (ref 3.87–5.11)
RDW: 14.2 % (ref 11.5–15.5)
WBC: 11.2 K/uL — ABNORMAL HIGH (ref 4.0–10.5)
nRBC: 0 % (ref 0.0–0.2)

## 2024-06-30 LAB — BASIC METABOLIC PANEL WITH GFR
Anion gap: 12 (ref 5–15)
BUN: 13 mg/dL (ref 6–20)
CO2: 24 mmol/L (ref 22–32)
Calcium: 8.7 mg/dL — ABNORMAL LOW (ref 8.9–10.3)
Chloride: 97 mmol/L — ABNORMAL LOW (ref 98–111)
Creatinine, Ser: 0.73 mg/dL (ref 0.44–1.00)
GFR, Estimated: >60 mL/min (ref >60)
Glucose, Bld: 293 mg/dL — ABNORMAL HIGH (ref 70–99)
Potassium: 3.9 mmol/L (ref 3.5–5.1)
Sodium: 133 mmol/L — ABNORMAL LOW (ref 135–145)

## 2024-06-30 LAB — GLUCOSE, CAPILLARY
Glucose-Capillary: 305 mg/dL — ABNORMAL HIGH (ref 70–99)
Glucose-Capillary: 273 mg/dL — ABNORMAL HIGH (ref 70–99)

## 2024-06-30 MED ORDER — INSULIN GLARGINE-YFGN 100 UNIT/ML ~~LOC~~ SOLN
6.0000 [IU] | Freq: Once | SUBCUTANEOUS | Status: AC
  Administered 2024-06-30: 6 [IU] via SUBCUTANEOUS
  Filled 2024-06-30: qty 0.06

## 2024-06-30 MED ORDER — LEVALBUTEROL HCL 0.63 MG/3ML IN NEBU
0.6300 mg | INHALATION_SOLUTION | Freq: Four times a day (QID) | RESPIRATORY_TRACT | 0 refills | Status: AC
  Filled 2024-06-30: qty 75, 9d supply, fill #0

## 2024-06-30 MED ORDER — PREDNISONE 10 MG (21) PO TBPK
ORAL_TABLET | ORAL | 0 refills | Status: DC
  Filled 2024-06-30: qty 21, 6d supply, fill #0

## 2024-06-30 MED ORDER — INSULIN GLARGINE-YFGN 100 UNIT/ML ~~LOC~~ SOLN
15.0000 [IU] | Freq: Two times a day (BID) | SUBCUTANEOUS | Status: DC
  Filled 2024-06-30: qty 0.15

## 2024-06-30 NOTE — Discharge Instructions (Signed)
 Follow with Primary MD de [place], Raymond J, MD in 7 days   Get CBC, CMP, 2 view Chest X ray checked  by Primary MD next visit.    Activity: As tolerated with Full fall precautions use walker/cane & assistance as needed   Disposition Home    Diet: Heart Healthy /carb modified  On your next visit with your primary care physician please Get Medicines reviewed and adjusted.   Please request your Prim.MD to go over all Hospital Tests and Procedure/Radiological results at the follow up, please get all Hospital records sent to your Prim MD by signing hospital release before you go home.   If you experience worsening of your admission symptoms, develop shortness of breath, life threatening emergency, suicidal or homicidal thoughts you must seek medical attention immediately by calling 911 or calling your MD immediately  if symptoms less severe.  You Must read complete instructions/literature along with all the possible adverse reactions/side effects for all the Medicines you take and that have been prescribed to you. Take any new Medicines after you have completely understood and accpet all the possible adverse reactions/side effects.   Do not drive, operating heavy machinery, perform activities at heights, swimming or participation in water activities or provide baby sitting services if your were admitted for syncope or siezures until you have seen by Primary MD or a Neurologist and advised to do so again.  Do not drive when taking Pain medications.    Do not take more than prescribed Pain, Sleep and Anxiety Medications  Special Instructions: If you have smoked or chewed Tobacco  in the last 2 yrs please stop smoking, stop any regular Alcohol  and or any Recreational drug use.  Wear Seat belts while driving.   Please note  You were cared for by a hospitalist during your hospital stay. If you have any questions about your discharge medications or the care you received while you were in  the hospital after you are discharged, you can call the unit and asked to speak with the hospitalist on call if the hospitalist that took care of you is not available. Once you are discharged, your primary care physician will handle any further medical issues. Please note that NO REFILLS for any discharge medications will be authorized once you are discharged, as it is imperative that you return to your primary care physician (or establish a relationship with a primary care physician if you do not have one) for your aftercare needs so that they can reassess your need for medications and monitor your lab values.

## 2024-06-30 NOTE — TOC Transition Note (Signed)
 Transition of Care [facility]) - Discharge Note   Patient Details  Name: Molly Archer MRN: [id number] Date of Birth: July 16, 1981  Transition of Care [facility]) CM/SW Contact:  Landry DELENA Senters, RN Phone Number: 06/30/2024, 2:49 PM   Clinical Narrative:     Patient discharging to home with mother, who will provide transportation.  Patient reports all needs met at home.  No further needs identified by CM.  Final next level of care: Home/Self Care Barriers to Discharge: No Barriers Identified   Patient Goals and CMS Choice            Discharge Placement                       Discharge Plan and Services Additional resources added to the After Visit Summary for                                       Social Drivers of Health (SDOH) Interventions SDOH Screenings   Food Insecurity: No Food Insecurity (06/29/2024)  Recent Concern: Food Insecurity - Food Insecurity Present (06/28/2024)  Housing: Low Risk  (06/29/2024)  Recent Concern: Housing - High Risk (06/28/2024)  Transportation Needs: No Transportation Needs (06/29/2024)  Utilities: Not At Risk (06/29/2024)  Alcohol Screen: Low Risk  (04/16/2023)  Depression (PHQ2-9): Low Risk  (04/02/2024)  Recent Concern: Depression (PHQ2-9) - Medium Risk (02/20/2024)  Financial Resource Strain: Medium Risk (06/28/2024)  Physical Activity: Insufficiently Active (06/28/2024)  Social Connections: Moderately Isolated (06/28/2024)  Stress: Stress Concern Present (06/28/2024)  Tobacco Use: Low Risk  (06/29/2024)  Health Literacy: Adequate Health Literacy (04/16/2023)     Readmission Risk Interventions     No data to display

## 2024-06-30 NOTE — Plan of Care (Signed)

## 2024-06-30 NOTE — Progress Notes (Signed)
   06/30/24 1137  Vitals  Pulse Rate 78  ECG Heart Rate 78  Resp 18  Level of Consciousness  Level of Consciousness Alert  Oxygen Therapy  SpO2 95 %  O2 Device Room Air  Patient Activity (if Appropriate) Ambulating  Pulse Oximetry Type Continuous

## 2024-06-30 NOTE — Discharge Summary (Signed)
 Physician Discharge Summary  Molly Archer FMW:[id number] DOB: March 15, 1981 DOA: 06/29/2024  PCP: de [place], Raymond J, MD  Admit date: 06/29/2024 Discharge date: 06/30/2024  Admitted From: (Home) Disposition:  (Home )  Recommendations for Outpatient Follow-up:  Follow up with PCP in 1-2 weeks Please obtain BMP/CBC in one week Ambulatory referral has been sent to pharmacy  Brief/Interim Summary:  Molly Archer is a 43 y.o. female with medical history significant of hypertension, insulin -dependent type 2 diabetes asthma, GERD, prior spontaneous pneumothorax shortly after cholecystectomy in June of this year.  Patient presented to [facility] today for evaluation of persistent shortness of breath and cough.  He was recently treated with Z-Pak and prednisone  taper for asthma exacerbation .  Acute asthma exacerbation Patient presents with 3 to 4-week history of shortness of breath, wheezing, cough.  Treated with course of steroid and Z-Pak from urgent care last Wednesday without improvement in symptoms.  Patient was tachypneic while in ED, with wheezing, so she was admitted, started on IV steroids, this morning reports dyspnea has resolved, mild wheezing on exam, he ambulated in the hallway, reported no further dyspnea, saturating at 67% on room air, she will be discharged on prednisone  taper, on as needed nebs (she was given prescription for Xopenex , having some tachycardia with albuterol )  - Patient reports recurrent frequent exacerbation episodes and the winter season, so ambulatory referral has been sent to pulmonary  - patient remained tachypneic after treatment in the ED including continuous nebulizer.  Chest x-ray was negative.  Viral PCR is negative for COVID flu A/B and RSV.   Chronic prescription opiate use Patient has history of lumbar radiculopathy and cervical spondylosis. -- Resume home regimen with oxycodone , Zanaflex    Insulin  dependent type 2 diabetes mellitus (HCC) Resume  home basal insulin  12 units daily.  Essential hypertension Continue with home medications   Gastroesophageal reflux disease Continue Protonix  and Pepcid      Lumbar radiculopathy Resumed on home pain regimen    Class 3 severe obesity with serious comorbidity and body mass index (BMI) of 50.0 to 59.9 in adult (HCC) Body mass index is 52.92 kg/m. Complicates overall care and prognosis.  Recommend lifestyle modifications including physical activity and diet for weight loss and overall long-term health.       Discharge Diagnoses:  Principal Problem:   Acute asthma exacerbation Active Problems:   Essential hypertension   Insulin  dependent type 2 diabetes mellitus (HCC)   Chronic prescription opiate use   Class 3 severe obesity with serious comorbidity and body mass index (BMI) of 50.0 to 59.9 in adult [facility])   Lumbar radiculopathy   Gastroesophageal reflux disease    Discharge Instructions  Discharge Instructions     Diet - low sodium heart healthy   Complete by: As directed    Increase activity slowly   Complete by: As directed    Pulmonary Visit   Complete by: As directed    Recurrent asthma exacerbation   Reason for referral: Other Pulmonary      Allergies as of 06/30/2024       Reactions   Codeine Hives   Doxycycline  Nausea Only   Pt states causes nausea and vomiting.   Losartan Other (See Comments)   headache   Penicillins Hives   Tramadol  Hives        Medication List     STOP taking these medications    azithromycin  250 MG tablet Commonly known as: ZITHROMAX    diclofenac  75 MG EC tablet Commonly  known as: VOLTAREN    methylPREDNISolone  4 MG Tbpk tablet Commonly known as: MEDROL  DOSEPAK   predniSONE  20 MG tablet Commonly known as: DELTASONE  Replaced by: predniSONE  10 MG (21) Tbpk tablet       TAKE these medications    Accu-Chek Guide w/Device Kit Use as directed in the morning, at noon, and at bedtime.   Accu-Chek Softclix Lancets  lancets by Other route. Use as instructed   albuterol  108 (90 Base) MCG/ACT inhaler Commonly known as: VENTOLIN  HFA Inhale 2 puffs into the lungs every 4 (four) hours as needed for wheezing or shortness of breath (cough, shortness of breath or wheezing.).   albuterol  (2.5 MG/3ML) 0.083% nebulizer solution Commonly known as: PROVENTIL  Inhale 3 mLs into the lungs every 6 (six) hours as needed for wheezing.   ALLERGY EYE OP Place 1 drop into both eyes daily as needed (allergies).   BLOOD GLUCOSE TEST STRIPS 333 VI 100 each by In Vitro route 2 (two) times daily before a meal.   Ciclopirox  1 % shampoo Apply 1 each topically at bedtime.   clobetasol  ointment 0.05 % Commonly known as: TEMOVATE  Apply 1 Application topically 2 (two) times daily.   dicyclomine  10 MG capsule Commonly known as: BENTYL  Take 1 capsule (10 mg total) by mouth 2 (two) times daily as needed for spasms.   doxazosin  1 MG tablet Commonly known as: CARDURA  Take 1 tablet (1 mg total) by mouth daily.   Embecta Pen Needle Nano 2 Gen 32G X 4 MM Misc Generic drug: Insulin  Pen Needle Use as directed for injecting insulin    famotidine  20 MG tablet Commonly known as: PEPCID  Take 1 tablet (20 mg total) by mouth 2 (two) times daily.   Fluocinolone  Acetonide Scalp 0.01 % Oil Apply topically.   Fluocinolone  Acetonide Scalp 0.01 % Oil Apply 1 Application topically 2 (two) times a week.   hydrochlorothiazide  12.5 MG capsule Commonly known as: MICROZIDE  Take 1 capsule (12.5 mg total) by mouth daily.   irbesartan -hydrochlorothiazide  300-12.5 MG tablet Commonly known as: AVALIDE Take 1 tablet by mouth daily.   Lantus  SoloStar 100 UNIT/ML Solostar Pen Generic drug: insulin  glargine Inject 12 Units into the skin daily.   levalbuterol 0.63 MG/3ML nebulizer solution Commonly known as: XOPENEX Take 3 mLs (0.63 mg total) by nebulization every 6 (six) hours for 8 days.   lidocaine  2 % solution Commonly known as:  XYLOCAINE  Use as directed 15 mLs in the mouth or throat every 4 (four) hours as needed.   metoCLOPramide  10 MG tablet Commonly known as: REGLAN  Take 1 tablet (10 mg total) by mouth every 8 (eight) hours as needed for Nausea   misoprostol 200 MCG tablet Commonly known as: CYTOTEC SMARTSIG:1 Tablet(s) By Mouth   Naftifine  HCl 2 % Crea Apply liberally to affected area daily   nitroGLYCERIN  0.1 mg/hr patch Commonly known as: NITRODUR - Dosed in mg/24 hr Place 1 patch (0.1 mg total) onto the skin daily.   norethindrone  5 MG tablet Commonly known as: AYGESTIN  Take 3 tablets (15 mg total) by mouth daily.   Oxycodone  HCl 10 MG Tabs Take 1 tablet (10 mg total) by mouth every 8 (eight) hours as needed.   pantoprazole  40 MG tablet Commonly known as: PROTONIX  Take 1 tablet (40 mg total) by mouth daily.   predniSONE  10 MG (21) Tbpk tablet Commonly known as: STERAPRED UNI-PAK 21 TAB Use per package instruction Replaces: predniSONE  20 MG tablet   promethazine -dextromethorphan 6.25-15 MG/5ML syrup Commonly known as: PROMETHAZINE -DM Take 5-10  mLs by mouth every 6 (six) hours as needed for cough mainly at bedtime.  Do not take and drive.   sucralfate  1 g tablet Commonly known as: CARAFATE  Take 1 tablet (1 g total) by mouth 4 (four) times daily.   tiZANidine  4 MG tablet Commonly known as: ZANAFLEX  Take 1 tablet (4 mg total) by mouth every 8 (eight) hours as needed for muscle spasms.   triamcinolone  cream 0.1 % Commonly known as: KENALOG  Apply 1 Application topically 2 (two) times daily.   zolpidem  5 MG tablet Commonly known as: AMBIEN  Take 1 tablet (5 mg total) by mouth at bedtime as needed for sleep.        Allergies  Allergen Reactions   Codeine Hives   Doxycycline  Nausea Only    Pt states causes nausea and vomiting.   Losartan Other (See Comments)    headache   Penicillins Hives   Tramadol  Hives    Consultations: None   Procedures/Studies: DG Chest 2  View Result Date: 06/29/2024 CLINICAL DATA:  shortness of breath, wheezing EXAM: CHEST - 2 VIEW COMPARISON:  January 13, 2024 FINDINGS: No focal airspace consolidation, pleural effusion, or pneumothorax. No cardiomegaly.No acute fracture or destructive lesion. Multilevel thoracic osteophytosis. IMPRESSION: No acute cardiopulmonary abnormality. Electronically Signed   By: Rogelia Myers M.D.   On: 06/29/2024 11:17      Subjective:  She denies any chest pain, report dyspnea much improved, ambulated in the hallway with no hypoxia or dyspnea Discharge Exam: Vitals:   06/30/24 1114 06/30/24 1137  BP: 137/83   Pulse:  78  Resp:  18  Temp:    SpO2:  95%   Vitals:   06/30/24 0753 06/30/24 0756 06/30/24 1114 06/30/24 1137  BP: (!) 165/87  137/83   Pulse:    78  Resp: 18   18  Temp: 98.2 F (36.8 C)     TempSrc: Oral  Oral   SpO2:  99%  95%  Weight:      Height:        General: Pt is alert, awake, not in acute distress Cardiovascular: RRR, S1/S2 +, no rubs, no gallops Respiratory: CTA bilaterally, no rhonchi Abdominal: Soft, NT, ND, bowel sounds + Extremities: no edema, no cyanosis    The results of significant diagnostics from this hospitalization (including imaging, microbiology, ancillary and laboratory) are listed below for reference.     Microbiology: Recent Results (from the past 240 hours)  Resp panel by RT-PCR (RSV, Flu A&B, Covid) Anterior Nasal Swab     Status: None   Collection Time: 06/29/24  9:45 AM   Specimen: Anterior Nasal Swab  Result Value Ref Range Status   SARS Coronavirus 2 by RT PCR NEGATIVE NEGATIVE Final    Comment: (NOTE) SARS-CoV-2 target nucleic acids are NOT DETECTED.  The SARS-CoV-2 RNA is generally detectable in upper respiratory specimens during the acute phase of infection. The lowest concentration of SARS-CoV-2 viral copies this assay can detect is 138 copies/mL. A negative result does not preclude SARS-Cov-2 infection and should not be  used as the sole basis for treatment or other patient management decisions. A negative result may occur with  improper specimen collection/handling, submission of specimen other than nasopharyngeal swab, presence of viral mutation(s) within the areas targeted by this assay, and inadequate number of viral copies(<138 copies/mL). A negative result must be combined with clinical observations, patient history, and epidemiological information. The expected result is Negative.  Fact Sheet for Patients:  bloggercourse.com  Fact Sheet  for Healthcare Providers:  seriousbroker.it  This test is no t yet approved or cleared by the [place]  FDA and  has been authorized for detection and/or diagnosis of SARS-CoV-2 by FDA under an Emergency Use Authorization (EUA). This EUA will remain  in effect (meaning this test can be used) for the duration of the COVID-19 declaration under Section 564(b)(1) of the Act, 21 U.S.C.section 360bbb-3(b)(1), unless the authorization is terminated  or revoked sooner.       Influenza A by PCR NEGATIVE NEGATIVE Final   Influenza B by PCR NEGATIVE NEGATIVE Final    Comment: (NOTE) The Xpert Xpress SARS-CoV-2/FLU/RSV plus assay is intended as an aid in the diagnosis of influenza from Nasopharyngeal swab specimens and should not be used as a sole basis for treatment. Nasal washings and aspirates are unacceptable for Xpert Xpress SARS-CoV-2/FLU/RSV testing.  Fact Sheet for Patients: bloggercourse.com  Fact Sheet for Healthcare Providers: seriousbroker.it  This test is not yet approved or cleared by the [place]  FDA and has been authorized for detection and/or diagnosis of SARS-CoV-2 by FDA under an Emergency Use Authorization (EUA). This EUA will remain in effect (meaning this test can be used) for the duration of the COVID-19 declaration under Section  564(b)(1) of the Act, 21 U.S.C. section 360bbb-3(b)(1), unless the authorization is terminated or revoked.     Resp Syncytial Virus by PCR NEGATIVE NEGATIVE Final    Comment: (NOTE) Fact Sheet for Patients: bloggercourse.com  Fact Sheet for Healthcare Providers: seriousbroker.it  This test is not yet approved or cleared by the [place]  FDA and has been authorized for detection and/or diagnosis of SARS-CoV-2 by FDA under an Emergency Use Authorization (EUA). This EUA will remain in effect (meaning this test can be used) for the duration of the COVID-19 declaration under Section 564(b)(1) of the Act, 21 U.S.C. section 360bbb-3(b)(1), unless the authorization is terminated or revoked.  Performed at [facility], [address], [place], [place] [address]      Labs: BNP (last 3 results) No results for input(s): BNP in the last 8760 hours. Basic Metabolic Panel: Recent Labs  Lab 06/29/24 1008 06/29/24 1110 06/29/24 1834 06/30/24 0314  NA 135 134*  --  133*  K 3.8 3.4*  --  3.9  CL 96*  --   --  97*  CO2 23  --   --  24  GLUCOSE 245*  --   --  293*  BUN 14  --   --  13  CREATININE 0.65  --  1.05* 0.73  CALCIUM 9.8  --   --  8.7*   Liver Function Tests: No results for input(s): AST, ALT, ALKPHOS, BILITOT, PROT, ALBUMIN in the last 168 hours. No results for input(s): LIPASE, AMYLASE in the last 168 hours. No results for input(s): AMMONIA in the last 168 hours. CBC: Recent Labs  Lab 06/29/24 1008 06/29/24 1110 06/29/24 1834 06/30/24 0314  WBC 13.3*  --  12.0* 11.2*  HGB 14.5 15.3* 14.1 14.0  HCT 43.2 45.0 42.1 41.3  MCV 83.7  --  83.7 83.3  PLT 354  --  359 361   Cardiac Enzymes: No results for input(s): CKTOTAL, CKMB, CKMBINDEX, TROPONINI in the last 168 hours. BNP: Invalid input(s): POCBNP CBG: Recent Labs  Lab 06/29/24 1417 06/29/24 2127  06/30/24 0757 06/30/24 1116  GLUCAP 349* 457* 305* 273*   D-Dimer Recent Labs    06/29/24 1008  DDIMER <0.27   Hgb A1c No results for input(s): HGBA1C  in the last 72 hours. Lipid Profile No results for input(s): CHOL, HDL, LDLCALC, TRIG, CHOLHDL, LDLDIRECT in the last 72 hours. Thyroid  function studies No results for input(s): TSH, T4TOTAL, T3FREE, THYROIDAB in the last 72 hours.  Invalid input(s): FREET3 Anemia work up No results for input(s): VITAMINB12, FOLATE, FERRITIN, TIBC, IRON, RETICCTPCT in the last 72 hours. Urinalysis    Component Value Date/Time   COLORURINE YELLOW 11/23/2023 1345   APPEARANCEUR CLEAR 11/23/2023 1345   APPEARANCEUR Clear 04/17/2018 1159   LABSPEC 1.021 11/23/2023 1345   PHURINE 6.0 11/23/2023 1345   GLUCOSEU NEGATIVE 11/23/2023 1345   HGBUR NEGATIVE 11/23/2023 1345   BILIRUBINUR NEGATIVE 11/23/2023 1345   BILIRUBINUR Negative 04/17/2018 1159   KETONESUR NEGATIVE 11/23/2023 1345   PROTEINUR TRACE (A) 11/23/2023 1345   UROBILINOGEN 0.2 10/17/2014 1138   NITRITE NEGATIVE 11/23/2023 1345   LEUKOCYTESUR SMALL (A) 11/23/2023 1345   Sepsis Labs Recent Labs  Lab 06/29/24 1008 06/29/24 1834 06/30/24 0314  WBC 13.3* 12.0* 11.2*   Microbiology Recent Results (from the past 240 hours)  Resp panel by RT-PCR (RSV, Flu A&B, Covid) Anterior Nasal Swab     Status: None   Collection Time: 06/29/24  9:45 AM   Specimen: Anterior Nasal Swab  Result Value Ref Range Status   SARS Coronavirus 2 by RT PCR NEGATIVE NEGATIVE Final    Comment: (NOTE) SARS-CoV-2 target nucleic acids are NOT DETECTED.  The SARS-CoV-2 RNA is generally detectable in upper respiratory specimens during the acute phase of infection. The lowest concentration of SARS-CoV-2 viral copies this assay can detect is 138 copies/mL. A negative result does not preclude SARS-Cov-2 infection and should not be used as the sole basis for treatment or other  patient management decisions. A negative result may occur with  improper specimen collection/handling, submission of specimen other than nasopharyngeal swab, presence of viral mutation(s) within the areas targeted by this assay, and inadequate number of viral copies(<138 copies/mL). A negative result must be combined with clinical observations, patient history, and epidemiological information. The expected result is Negative.  Fact Sheet for Patients:  bloggercourse.com  Fact Sheet for Healthcare Providers:  seriousbroker.it  This test is no t yet approved or cleared by the [place]  FDA and  has been authorized for detection and/or diagnosis of SARS-CoV-2 by FDA under an Emergency Use Authorization (EUA). This EUA will remain  in effect (meaning this test can be used) for the duration of the COVID-19 declaration under Section 564(b)(1) of the Act, 21 U.S.C.section 360bbb-3(b)(1), unless the authorization is terminated  or revoked sooner.       Influenza A by PCR NEGATIVE NEGATIVE Final   Influenza B by PCR NEGATIVE NEGATIVE Final    Comment: (NOTE) The Xpert Xpress SARS-CoV-2/FLU/RSV plus assay is intended as an aid in the diagnosis of influenza from Nasopharyngeal swab specimens and should not be used as a sole basis for treatment. Nasal washings and aspirates are unacceptable for Xpert Xpress SARS-CoV-2/FLU/RSV testing.  Fact Sheet for Patients: bloggercourse.com  Fact Sheet for Healthcare Providers: seriousbroker.it  This test is not yet approved or cleared by the [place]  FDA and has been authorized for detection and/or diagnosis of SARS-CoV-2 by FDA under an Emergency Use Authorization (EUA). This EUA will remain in effect (meaning this test can be used) for the duration of the COVID-19 declaration under Section 564(b)(1) of the Act, 21 U.S.C. section  360bbb-3(b)(1), unless the authorization is terminated or revoked.     Resp Syncytial Virus by PCR  NEGATIVE NEGATIVE Final    Comment: (NOTE) Fact Sheet for Patients: bloggercourse.com  Fact Sheet for Healthcare Providers: seriousbroker.it  This test is not yet approved or cleared by the [place]  FDA and has been authorized for detection and/or diagnosis of SARS-CoV-2 by FDA under an Emergency Use Authorization (EUA). This EUA will remain in effect (meaning this test can be used) for the duration of the COVID-19 declaration under Section 564(b)(1) of the Act, 21 U.S.C. section 360bbb-3(b)(1), unless the authorization is terminated or revoked.  Performed at [facility], [address] [place], [place] [address]      Time coordinating discharge: Over 30 minutes  SIGNED:   Brayton Lye, MD  [facility] 06/30/2024, 1:33 PM Pager   If 7PM-7AM, please contact night-coverage [email or web address] Password [email or web address]

## 2024-06-30 NOTE — TOC Initial Note (Signed)
 Transition of Care [facility]) - Initial/Assessment Note    Patient Details  Name: Molly Archer MRN: [id number] Date of Birth: 1980/11/23  [facility] of [facility]) CM/SW Contact:    Landry DELENA Senters, RN Phone Number: 06/30/2024, 12:03 PM  Clinical Narrative:                 RR:[id number] history significant of hypertension, insulin -dependent type 2 diabetes asthma, GERD, prior spontaneous pneumothorax shortly after cholecystectomy in June of this year.  Patient presented to [facility] today for evaluation of persistent shortness of breath and cough.  Patient reports 3 to 4-week history of shortness of breath, cough and wheezing.  She was seen at urgent care on Wednesday and prescribed steroid and Z-Pak.  She took these as prescribed but continued to have persistent symptoms without improvement.   Patient lives at home with her mother, has support at home, transportation, and mother will drive patient home at d/c.   Patient has PCP, manages medications, drives, is still working.   Continued medical workup but may d/c later today.   CM will continue to follow for potential needs.  Expected Discharge Plan:  (TBD) Barriers to Discharge: Continued Medical Work up   Patient Goals and CMS Choice            Expected Discharge Plan and Services       Living arrangements for the past 2 months: Single Family Home                                      Prior Living Arrangements/Services Living arrangements for the past 2 months: Single Family Home Lives with:: Self, Parents Patient language and need for interpreter reviewed:: Yes Do you feel safe going back to the place where you live?: Yes      Need for Family Participation in Patient Care: Yes (Comment) Care giver support system in place?: Yes (comment) Current home services: DME (cane, walker, shower bench) Criminal Activity/Legal Involvement Pertinent to Current Situation/Hospitalization: No - Comment as needed  Activities  of Daily Living   ADL Screening (condition at time of admission) Independently performs ADLs?: Yes (appropriate for developmental age) Is the patient deaf or have difficulty hearing?: No Does the patient have difficulty seeing, even when wearing glasses/contacts?: Yes Does the patient have difficulty concentrating, remembering, or making decisions?: No  Permission Sought/Granted                  Emotional Assessment Appearance:: Developmentally appropriate Attitude/Demeanor/Rapport: Engaged Affect (typically observed): Calm Orientation: : Oriented to Self, Oriented to Place, Oriented to  Time, Oriented to Situation Alcohol / Substance Use: Not Applicable Psych Involvement: No (comment)  Admission diagnosis:  Acute asthma exacerbation [J45.901] Exacerbation of asthma, unspecified asthma severity, unspecified whether persistent [J45.901] Patient Active Problem List   Diagnosis Date Noted   Acute asthma exacerbation 06/29/2024   Diverticulosis of colon without hemorrhage 05/06/2024   Diarrhea 05/06/2024   Gastroesophageal reflux disease 04/14/2024   Nausea and vomiting 04/14/2024   Dysphagia 04/14/2024   Physical deconditioning 01/08/2024   Chronic prescription opiate use 01/03/2024   Primary spontaneous pneumothorax 01/02/2024   Constipation 09/25/2023   Early awakening 08/26/2023   Lumbar radiculopathy 07/17/2023   Adjustment disorder with anxious mood 05/16/2023   Primary osteoarthritis of right knee 05/15/2023   Right hip pain 05/15/2023   Spondylosis of lumbar spine 05/15/2023   Cervical spondylolysis 04/16/2023  Cervical sprain 04/16/2023   Haglund's deformity of left heel 04/16/2023   Intracranial hypertension 03/04/2023   Insulin  dependent type 2 diabetes mellitus (HCC) 03/04/2023   Primary insomnia 09/19/2022   Migraine without aura and without status migrainosus, not intractable 05/02/2020   Complex tear of lateral meniscus of left knee as current injury  12/22/2019   Iron overload 12/21/2019   Derangement of left knee 09/08/2019   Dermatitis 12/18/2018   Vitamin D  deficiency 09/25/2018   Absolute anemia 09/25/2018   Class 3 severe obesity with serious comorbidity and body mass index (BMI) of 50.0 to 59.9 in adult (HCC) 09/25/2018   Thickened endometrium 07/20/2018   History of complex endometrial hyperplasia 07/20/2018   Morbid obesity with BMI of 50.0-59.9, adult (HCC) 07/20/2018   Abnormal uterine bleeding (AUB)    Essential hypertension 02/25/2012   Hx of Bell's palsy 02/25/2012   PCP:  de Cuba, Raymond J, MD Pharmacy:   [facility] [address] [place] [address] Phone: [phone] Fax: [phone]     Social Drivers of Health (SDOH) Social History: SDOH Screenings   Food Insecurity: No Food Insecurity (06/29/2024)  Recent Concern: Food Insecurity - Food Insecurity Present (06/28/2024)  Housing: Low Risk  (06/29/2024)  Recent Concern: Housing - High Risk (06/28/2024)  Transportation Needs: No Transportation Needs (06/29/2024)  Utilities: Not At Risk (06/29/2024)  Alcohol Screen: Low Risk  (04/16/2023)  Depression (PHQ2-9): Low Risk  (04/02/2024)  Recent Concern: Depression (PHQ2-9) - Medium Risk (02/20/2024)  Financial Resource Strain: Medium Risk (06/28/2024)  Physical Activity: Insufficiently Active (06/28/2024)  Social Connections: Moderately Isolated (06/28/2024)  Stress: Stress Concern Present (06/28/2024)  Tobacco Use: Low Risk  (06/29/2024)  Health Literacy: Adequate Health Literacy (04/16/2023)   SDOH Interventions:     Readmission Risk Interventions     No data to display

## 2024-07-05 ENCOUNTER — Other Ambulatory Visit (HOSPITAL_BASED_OUTPATIENT_CLINIC_OR_DEPARTMENT_OTHER): Payer: Self-pay | Admitting: Family Medicine

## 2024-07-05 ENCOUNTER — Other Ambulatory Visit (HOSPITAL_BASED_OUTPATIENT_CLINIC_OR_DEPARTMENT_OTHER): Payer: Self-pay

## 2024-07-06 ENCOUNTER — Ambulatory Visit: Payer: Self-pay

## 2024-07-06 ENCOUNTER — Other Ambulatory Visit: Payer: Self-pay

## 2024-07-06 ENCOUNTER — Other Ambulatory Visit (HOSPITAL_BASED_OUTPATIENT_CLINIC_OR_DEPARTMENT_OTHER): Payer: Self-pay

## 2024-07-06 MED ORDER — CICLOPIROX 1 % EX SHAM
1.0000 | MEDICATED_SHAMPOO | Freq: Every day | CUTANEOUS | 0 refills | Status: AC
  Filled 2024-07-06: qty 120, 30d supply, fill #0

## 2024-07-06 MED ORDER — TRIAMCINOLONE ACETONIDE 0.1 % EX CREA
1.0000 | TOPICAL_CREAM | Freq: Two times a day (BID) | CUTANEOUS | 0 refills | Status: DC
  Filled 2024-07-06: qty 30, 60d supply, fill #0

## 2024-07-06 NOTE — Telephone Encounter (Signed)
 FYI Only or Action Required?: Action required by provider: request for appointment.  Patient was last seen in primary care on 04/02/2024 by de Cuba, Quintin PARAS, MD.  Called Nurse Triage reporting Cough.  Symptoms began several weeks ago.  Interventions attempted: Prescription medications: prednisone , cough syrup, inhalers and nebulizer treatments and Other: hospitalized on 06/29/24.  Symptoms are: stable.  Triage Disposition: See HCP Within 4 Hours (Or PCP Triage)  Patient/caregiver understands and will follow disposition?: Yes                               1. ONSET: When did the cough begin?      5 weeks ago, worsened last week, hospitalized on 06/29/24 2. SEVERITY: How bad is the cough today?      Moderate to severe 3. SPUTUM: Describe the color of your sputum (e.g., none, dry cough; clear, white, yellow, green)     A little bit of mucous comes up 4. HEMOPTYSIS: Are you coughing up any blood? If Yes, ask: How much? (e.g., flecks, streaks, tablespoons, etc.)     Denies blood since discharge from hospital  5. DIFFICULTY BREATHING: Are you having difficulty breathing? If Yes, ask: How bad is it? (e.g., mild, moderate, severe)      Denies at this time, but states she experiences mild SOB upon exertion Patient able to speak in clear and complete sentences while on phone with this RN 6. FEVER: Do you have a fever? If Yes, ask: What is your temperature, how was it measured, and when did it start?     Denies 7. CARDIAC HISTORY: Do you have any history of heart disease? (e.g., heart attack, congestive heart failure)      Denies 8. LUNG HISTORY: Do you have any history of lung disease?  (e.g., pulmonary embolus, asthma, emphysema)     Asthma, history of lung collapse following surgery  9. PE RISK FACTORS: Do you have a history of blood clots? (or: recent major surgery, recent prolonged travel, bedridden)     Denies history of blood  clots 10. OTHER SYMPTOMS: Do you have any other symptoms? (e.g., runny nose, wheezing, chest pain)     Wheezing, mild runny nose, headache  Denies chest pain unless she has a severe coughing spell    Patient was recently hospitalized for symptoms and was discharged on Tuesday of last week. Patient has HFU scheduled for Thursday of this week. Patient stated symptoms have fluctuated between improving and worsening since discharge. Patient stated she completed her last dose of prednisone  this morning and finished the prescribed cough syrup 2 days ago. This RN checked for HFU availability and patient is scheduled for first available HFU. This RN advised patient to go to UC for symptoms at this time. Patient verbalized understanding and agreed to go. Please advise if HFU can be arranged sooner.   Copied from CRM [id number]. Topic: Clinical - Red Word Triage >> Jul 06, 2024  4:14 PM Delon HERO wrote: Red Word that prompted transfer to Nurse Triage: Patient is calling to report that she is short of breathe with wheezing took her last Rx #: [id number]  predniSONE  (STERAPRED UNI-PAK 21 TAB) 10 MG (21) TBPK tablet [[id number]] Today went to the ED 06/29/2024-06/30/2024. Hospital follow up not until Thursday. Please advise  Reason for Disposition  [1] MILD difficulty breathing (e.g., minimal/no SOB at rest, SOB with walking, pulse < 100) AND [2] still present when not coughing  Protocols used: Cough - Acute Productive-A-AH

## 2024-07-07 ENCOUNTER — Other Ambulatory Visit (HOSPITAL_BASED_OUTPATIENT_CLINIC_OR_DEPARTMENT_OTHER): Payer: Self-pay

## 2024-07-07 NOTE — Telephone Encounter (Signed)
 Pt scheduled 12/11 for an appt.

## 2024-07-08 ENCOUNTER — Ambulatory Visit: Admitting: Podiatry

## 2024-07-09 ENCOUNTER — Encounter (HOSPITAL_BASED_OUTPATIENT_CLINIC_OR_DEPARTMENT_OTHER): Payer: Self-pay

## 2024-07-09 ENCOUNTER — Ambulatory Visit (INDEPENDENT_AMBULATORY_CARE_PROVIDER_SITE_OTHER): Payer: Self-pay

## 2024-07-09 ENCOUNTER — Other Ambulatory Visit (HOSPITAL_BASED_OUTPATIENT_CLINIC_OR_DEPARTMENT_OTHER): Payer: Self-pay

## 2024-07-09 ENCOUNTER — Encounter (HOSPITAL_BASED_OUTPATIENT_CLINIC_OR_DEPARTMENT_OTHER): Payer: Self-pay | Admitting: Family Medicine

## 2024-07-09 ENCOUNTER — Ambulatory Visit (INDEPENDENT_AMBULATORY_CARE_PROVIDER_SITE_OTHER)

## 2024-07-09 VITALS — BP 148/77 | HR 86 | Temp 98.5°F | Ht 65.0 in | Wt 319.2 lb

## 2024-07-09 DIAGNOSIS — R0981 Nasal congestion: Secondary | ICD-10-CM

## 2024-07-09 DIAGNOSIS — J329 Chronic sinusitis, unspecified: Secondary | ICD-10-CM

## 2024-07-09 DIAGNOSIS — H6593 Unspecified nonsuppurative otitis media, bilateral: Secondary | ICD-10-CM

## 2024-07-09 DIAGNOSIS — R052 Subacute cough: Secondary | ICD-10-CM

## 2024-07-09 MED ORDER — FLUTICASONE PROPIONATE 50 MCG/ACT NA SUSP
2.0000 | Freq: Every day | NASAL | 6 refills | Status: AC
  Filled 2024-07-09: qty 16, 30d supply, fill #0
  Filled 2024-08-16: qty 16, 60d supply, fill #1

## 2024-07-09 NOTE — Progress Notes (Unsigned)
 Subjective:   Molly Archer Nov 27, 1980 07/09/2024  Chief Complaint  Patient presents with   Follow-up    Patient wants to follow up with her recent hospital visit where she was diagnosed with bronchitis. Stated she has been coughing blood, has right ear pain, and a sore throat. Has had these symptoms for 5 weeks and went to the hospital last Tuesday, but has been to urgent care multiple times.    HPI: Molly Archer presents today for hospital follow up.   States 5 weeks ago she began with a post nasal drip that progressed into a cough. Was seen at [facility] after 3 weeks of symptoms and diagnosed with Bronchitis. Pt was given cough syrup, steroids, breathing treatment, and Zpack. Pt went back that following week an was told she had mucus on her lungs and to be seen in ER. Pt was told in ER her chest xray looked normal however they admitted her for 2 days for further monitoring after multiple breathing treatments without success. States she had chest pain and tachycardia during ER visit also prompting her admission. Pt has been using home nebulizer solution and rescue inhaler at home without relief. States she woke up this morning still feeling poor with a sore throat, ear pain, and blood tinged sputum noted when she coughs. Pt states she does have a follow up with pulmonology on Monday.   The following portions of the patient's history were reviewed and updated as appropriate: past medical history, past surgical history, family history, social history, allergies, medications, and problem list.   Patient Active Problem List   Diagnosis Date Noted   Acute asthma exacerbation 06/29/2024   Diverticulosis of colon without hemorrhage 05/06/2024   Diarrhea 05/06/2024   Gastroesophageal reflux disease 04/14/2024   Nausea and vomiting 04/14/2024   Dysphagia 04/14/2024   Physical deconditioning 01/08/2024   Chronic prescription opiate use 01/03/2024   Primary spontaneous pneumothorax  01/02/2024   Constipation 09/25/2023   Early awakening 08/26/2023   Lumbar radiculopathy 07/17/2023   Adjustment disorder with anxious mood 05/16/2023   Primary osteoarthritis of right knee 05/15/2023   Right hip pain 05/15/2023   Spondylosis of lumbar spine 05/15/2023   Cervical spondylolysis 04/16/2023   Cervical sprain 04/16/2023   Haglund's deformity of left heel 04/16/2023   Intracranial hypertension 03/04/2023   Insulin  dependent type 2 diabetes mellitus (HCC) 03/04/2023   Primary insomnia 09/19/2022   Migraine without aura and without status migrainosus, not intractable 05/02/2020   Complex tear of lateral meniscus of left knee as current injury 12/22/2019   Iron overload 12/21/2019   Derangement of left knee 09/08/2019   Dermatitis 12/18/2018   Vitamin D  deficiency 09/25/2018   Absolute anemia 09/25/2018   Class 3 severe obesity with serious comorbidity and body mass index (BMI) of 50.0 to 59.9 in adult [facility]) 09/25/2018   Thickened endometrium 07/20/2018   History of complex endometrial hyperplasia 07/20/2018   Morbid obesity with BMI of 50.0-59.9, adult (HCC) 07/20/2018   Abnormal uterine bleeding (AUB)    Essential hypertension 02/25/2012   Hx of Bell's palsy 02/25/2012   Past Medical History:  Diagnosis Date   Allergy    Anemia    pt states she took iron for this around 2019, and has had no issues since   Arthritis    knees   Asthma    only uses inhaler in fall and spring   Constipation    Diabetes mellitus without complication (HCC)  GERD (gastroesophageal reflux disease)    diet controlled, occ. uses omeprazole    Headache    otc med prn   Heart murmur    History of kidney stones    Hx of Bell's palsy 12/2011   Hypertension    Menometrorrhagia    Nausea and vomiting 04/14/2024   Psoriasis    Shortness of breath dyspnea    with exercise/exertion   Vitamin D  deficiency    Past Surgical History:  Procedure Laterality Date   CHOLECYSTECTOMY N/A  01/01/2024   Procedure: LAPAROSCOPIC CHOLECYSTECTOMY;  Surgeon: Tanda Locus, MD;  Location: [facility] OR;  Service: General;  Laterality: N/A;   COLONOSCOPY N/A 05/06/2024   Procedure: COLONOSCOPY;  Surgeon: Abran Norleen SAILOR, MD;  Location: [facility];  Service: Gastroenterology;  Laterality: N/A;   COMBINED HYSTEROSCOPY DIAGNOSTIC / D&C N/A 07/30/2009   Benign secretory endometrium   CYSTOSCOPY WITH STENT PLACEMENT Left 10/19/2014   Procedure: CYSTOSCOPY WITH STENT PLACEMENT;  Surgeon: Morene LELON Salines, MD;  Location: [facility];  Service: Urology;  Laterality: Left;   DILATION AND CURETTAGE OF UTERUS  10/29/2003   Polyp, mild atypia, simple and complex hyperplasia,   DILATION AND CURETTAGE OF UTERUS  11/28/2003   Simple and complex hyperplasia   DILATION AND CURETTAGE OF UTERUS  07/30/2009   DILATION AND CURETTAGE OF UTERUS N/A 12/14/2014   Procedure: DILATATION AND CURETTAGE;  Surgeon: Winton Felt, MD;  Location: [facility];  Service: Gynecology;  Laterality: N/A;   ESOPHAGOGASTRODUODENOSCOPY N/A 05/06/2024   Procedure: EGD (ESOPHAGOGASTRODUODENOSCOPY);  Surgeon: Abran Norleen SAILOR, MD;  Location: [facility];  Service: Gastroenterology;  Laterality: N/A;   HOLMIUM LASER APPLICATION Left 10/19/2014   Procedure: HOLMIUM LASER APPLICATION;  Surgeon: Morene LELON Salines, MD;  Location: [facility];  Service: Urology;  Laterality: Left;   HYSTEROSCOPY WITH D & C N/A 07/21/2018   Procedure: DILATATION AND CURETTAGE /HYSTEROSCOPY;  Surgeon: Eveline Lynwood MATSU, MD;  Location: [facility];  Service: Gynecology;  Laterality: N/A;   INTRAUTERINE DEVICE (IUD) INSERTION N/A 12/14/2014   Procedure: INTRAUTERINE DEVICE (IUD) INSERTION;  Surgeon: Winton Felt, MD;  Location: [facility];  Service: Gynecology;  Laterality: N/A;   KNEE CARTILAGE SURGERY Left    STONE EXTRACTION WITH BASKET Left 10/19/2014   Procedure: STONE EXTRACTION WITH BASKET;  Surgeon: Morene LELON Salines, MD;  Location: [facility];  Service: Urology;  Laterality: Left;    WISDOM TOOTH EXTRACTION     Family History  Problem Relation Age of Onset   Hypertension Mother    Hyperlipidemia Mother    Diabetes Mother    Hypertension Father    Diabetes Father    Stroke Maternal Grandmother    Cancer Maternal Grandfather    Hypertension Other    Diabetes Other    Cancer Other    Cancer Maternal Aunt    Diabetes Maternal Aunt    Diabetes Maternal Uncle    Liver disease Neg Hx    Colon cancer Neg Hx    Esophageal cancer Neg Hx    Outpatient Medications Prior to Visit  Medication Sig Dispense Refill   Accu-Chek Softclix Lancets lancets by Other route. Use as instructed     albuterol  (PROVENTIL ) (2.5 MG/3ML) 0.083% nebulizer solution Inhale 3 mLs into the lungs every 6 (six) hours as needed for wheezing. 150 mL 0   albuterol  (VENTOLIN  HFA) 108 (90 Base) MCG/ACT inhaler Inhale 2 puffs into the lungs every 4 (four) hours as needed for wheezing or shortness of breath (cough, shortness of breath  or wheezing.). 18 g 1   Blood Glucose Monitoring Suppl (BLOOD GLUCOSE MONITOR SYSTEM) w/Device KIT Use as directed in the morning, at noon, and at bedtime. 1 kit 0   Ciclopirox  1 % shampoo Apply to the affected area(s) topically at bedtime. 120 mL 0   clobetasol  ointment (TEMOVATE ) 0.05 % Apply 1 Application topically 2 (two) times daily. 30 g 1   dicyclomine  (BENTYL ) 10 MG capsule Take 1 capsule (10 mg total) by mouth 2 (two) times daily as needed for spasms. 60 capsule 1   doxazosin  (CARDURA ) 1 MG tablet Take 1 tablet (1 mg total) by mouth daily. 30 tablet 3   Fluocinolone  Acetonide Scalp 0.01 % OIL Apply topically.     Fluocinolone  Acetonide Scalp 0.01 % OIL Apply 1 Application topically 2 (two) times a week. 118.28 mL 1   Glucose Blood (BLOOD GLUCOSE TEST STRIPS 333 VI) 100 each by In Vitro route 2 (two) times daily before a meal.     hydrochlorothiazide  (MICROZIDE ) 12.5 MG capsule Take 1 capsule (12.5 mg total) by mouth daily. 90 capsule 1   insulin  glargine (LANTUS   SOLOSTAR) 100 UNIT/ML Solostar Pen Inject 12 Units into the skin daily. 15 mL 3   Insulin  Pen Needle (PEN NEEDLES) 32G X 4 MM MISC Use as directed for injecting insulin  100 each 2   irbesartan -hydrochlorothiazide  (AVALIDE) 300-12.5 MG tablet Take 1 tablet by mouth daily. 90 tablet 1   levalbuterol  (XOPENEX ) 0.63 MG/3ML nebulizer solution Take 3 mLs (0.63 mg total) by nebulization every 6 (six) hours for 8 days. 75 mL 0   lidocaine  (XYLOCAINE ) 2 % solution Use as directed 15 mLs in the mouth or throat every 4 (four) hours as needed. 270 mL 0   metoCLOPramide  (REGLAN ) 10 MG tablet Take 1 tablet (10 mg total) by mouth every 8 (eight) hours as needed for Nausea 30 tablet 1   Naftifine  HCl 2 % CREA Apply liberally to affected area daily 60 g 1   Naphazoline-Pheniramine (ALLERGY EYE OP) Place 1 drop into both eyes daily as needed (allergies).     norethindrone  (AYGESTIN ) 5 MG tablet Take 3 tablets (15 mg total) by mouth daily. 180 tablet 5   Oxycodone  HCl 10 MG TABS Take 1 tablet (10 mg total) by mouth every 8 (eight) hours as needed. 75 tablet 0   pantoprazole  (PROTONIX ) 40 MG tablet Take 1 tablet (40 mg total) by mouth daily. 90 tablet 3   promethazine -dextromethorphan  (PROMETHAZINE -DM) 6.25-15 MG/5ML syrup Take 5-10 mLs by mouth every 6 (six) hours as needed for cough mainly at bedtime.  Do not take and drive. 180 mL 0   sucralfate  (CARAFATE ) 1 g tablet Take 1 tablet (1 g total) by mouth 4 (four) times daily. 360 tablet 1   tiZANidine  (ZANAFLEX ) 4 MG tablet Take 1 tablet (4 mg total) by mouth every 8 (eight) hours as needed for muscle spasms. 30 tablet 0   triamcinolone  cream (KENALOG ) 0.1 % Apply to the affected area(s) topically 2 (two) times daily. 30 g 0   zolpidem  (AMBIEN ) 5 MG tablet Take 1 tablet (5 mg total) by mouth at bedtime as needed for sleep. 30 tablet 0   famotidine  (PEPCID ) 20 MG tablet Take 1 tablet (20 mg total) by mouth 2 (two) times daily. (Patient not taking: Reported on 07/09/2024)  180 tablet 3   misoprostol (CYTOTEC) 200 MCG tablet SMARTSIG:1 Tablet(s) By Mouth     nitroGLYCERIN  (NITRODUR - DOSED IN MG/24 HR) 0.1 mg/hr patch Place 1  patch (0.1 mg total) onto the skin daily. (Patient not taking: Reported on 07/09/2024) 30 patch 12   predniSONE  (STERAPRED UNI-PAK 21 TAB) 10 MG (21) TBPK tablet Use per package instruction (Patient not taking: Reported on 07/09/2024) 21 tablet 0   No facility-administered medications prior to visit.   Allergies[1]   ROS: A complete ROS was performed with pertinent positives/negatives noted in the HPI. The remainder of the ROS are negative.    Objective:   Today's Vitals   07/09/24 1517  BP: (!) 148/77  Pulse: 86  Temp: 98.5 F (36.9 C)  TempSrc: Oral  SpO2: 96%  Weight: (!) 319 lb 3.2 oz (144.8 kg)  Height: 5' 5 (1.651 m)    Physical Exam         GENERAL: Ill-appearing SKIN: Pink, warm and dry. No rash, lesion, ulceration, or ecchymoses.  Head: Normocephalic. NECK: Trachea midline. Full ROM w/o pain or tenderness. No lymphadenopathy.  EARS: Tympanic membranes are erythematous and bulging.  EYES: Conjunctiva clear without exudates. EOMI, PERRL, no drainage present.  NOSE: Septum midline w/o deformity. Nares patent, mucosa pink. Inflammation to bilateral nares.  THROAT: Uvula midline. Oropharynx clear. Tonsils non-inflamed with erythema RESPIRATORY: Chest wall symmetrical. Breath sounds clear to auscultation bilaterally. [name] CARDIAC: S1, S2 present, regular rate and rhythm without murmur or gallops. Peripheral pulses 2+ bilaterally.  MSK: Muscle tone and strength appropriate for age. Joints w/o tenderness, redness, or swelling.  EXTREMITIES: Without clubbing, cyanosis, or edema.  NEUROLOGIC: No motor or sensory deficits. Steady, even gait. C2-C12 intact.  PSYCH/MENTAL STATUS: Alert, oriented x 3. Cooperative, appropriate mood and affect.   Health Maintenance Due  Topic Date Due   Hepatitis C Screening  Never done    Pneumococcal Vaccine (1 of 2 - PCV) Never done   Hepatitis B Vaccines 19-59 Average Risk (1 of 3 - 19+ 3-dose series) Never done   HPV VACCINES (1 - Risk 3-dose SCDM series) Never done   DTaP/Tdap/Td (2 - Td or Tdap) 07/03/2018   Mammogram  06/06/2022   COVID-19 Vaccine (1 - 2025-26 season) Never done   FOOT EXAM  04/15/2024    No results found for any visits on 07/09/24.  The 10-year ASCVD risk score (Arnett DK, et al., 2019) is: 46%     Assessment & Plan:  1. Subacute cough (Primary) Discussed a lingering cough usually associated with bronchitis. Obtained a chest xray to rule out pneumonia since symptoms have not resolved. Discussed keeping follow up appointment with pulmonology. Discussed OTC medication to help with cough symptoms.  - DG Chest 2 View; Future  2. Otitis media with effusion, bilateral Discussed the appropriate need of abx in the setting of her otitis media. Discussed proper hydration and probiotic to decrease GI effects.  - levofloxacin  (LEVAQUIN ) 500 MG tablet; Take 1 tablet (500 mg total) by mouth daily for 5 days.  Dispense: 5 tablet; Refill: 0  3. Nasal congestion Refilled flonase . - fluticasone  (FLONASE ) 50 MCG/ACT nasal spray; Place 2 sprays into both nostrils daily.  Dispense: 16 g; Refill: 6  4. Rhinosinusitis Discussed the appropriate need of abx in the setting of her rhinosinusitis. Discussed proper hydration and probiotic to decrease GI effects. - levofloxacin  (LEVAQUIN ) 500 MG tablet; Take 1 tablet (500 mg total) by mouth daily for 5 days.  Dispense: 5 tablet; Refill: 0  Meds ordered this encounter  Medications   fluticasone  (FLONASE ) 50 MCG/ACT nasal spray    Sig: Place 2 sprays into both nostrils daily.  Dispense:  16 g    Refill:  6   levofloxacin  (LEVAQUIN ) 500 MG tablet    Sig: Take 1 tablet (500 mg total) by mouth daily for 5 days.    Dispense:  5 tablet    Refill:  0   Lab Orders  No laboratory test(s) ordered today   No images are  attached to the encounter or orders placed in the encounter.  Return if symptoms worsen or fail to improve.    Patient to reach out to office if new, worrisome, or unresolved symptoms arise or if no improvement in patient's condition. Patient verbalized understanding and is agreeable to treatment plan. All questions answered to patient's satisfaction.    Lauraine Almarie Angus DNP, FNP-C      [1]  Allergies Allergen Reactions   Clindamycin     Codeine Hives   Doxycycline  Nausea Only    Pt states causes nausea and vomiting.   Losartan Other (See Comments)    headache   Penicillins Hives   Tramadol  Hives

## 2024-07-09 NOTE — Patient Instructions (Addendum)
 I have ordered your chest xray as stat. I am going to wait for the chest xray to be read to make sure you do not have pneumonia before I order the antibiotics for your ear infection in case you need coverage for both.   If your chest xray is clear, I will place medication for you to pick up at the pharmacy for you acute bilateral ear infection.

## 2024-07-10 ENCOUNTER — Other Ambulatory Visit (HOSPITAL_BASED_OUTPATIENT_CLINIC_OR_DEPARTMENT_OTHER): Payer: Self-pay | Admitting: Family Medicine

## 2024-07-10 ENCOUNTER — Other Ambulatory Visit (HOSPITAL_BASED_OUTPATIENT_CLINIC_OR_DEPARTMENT_OTHER): Payer: Self-pay

## 2024-07-10 ENCOUNTER — Ambulatory Visit (HOSPITAL_BASED_OUTPATIENT_CLINIC_OR_DEPARTMENT_OTHER): Payer: Self-pay

## 2024-07-10 ENCOUNTER — Encounter

## 2024-07-10 ENCOUNTER — Other Ambulatory Visit: Payer: Self-pay

## 2024-07-10 MED ORDER — PROMETHAZINE-DM 6.25-15 MG/5ML PO SYRP
5.0000 mL | ORAL_SOLUTION | Freq: Four times a day (QID) | ORAL | 0 refills | Status: DC | PRN
  Filled 2024-07-10: qty 180, 9d supply, fill #0

## 2024-07-10 MED ORDER — ZOLPIDEM TARTRATE 5 MG PO TABS
5.0000 mg | ORAL_TABLET | Freq: Every evening | ORAL | 0 refills | Status: DC | PRN
  Filled 2024-07-10: qty 30, 30d supply, fill #0

## 2024-07-10 MED ORDER — LEVOFLOXACIN 500 MG PO TABS
500.0000 mg | ORAL_TABLET | Freq: Every day | ORAL | 0 refills | Status: AC
  Filled 2024-07-10: qty 5, 5d supply, fill #0

## 2024-07-10 MED ORDER — TIZANIDINE HCL 4 MG PO TABS
4.0000 mg | ORAL_TABLET | Freq: Three times a day (TID) | ORAL | 0 refills | Status: DC | PRN
  Filled 2024-07-10: qty 30, 10d supply, fill #0

## 2024-07-10 NOTE — Telephone Encounter (Signed)
 Patient states she was recommended to take cough medicine OTC but it does not help. Patient would like to be placed back on cough medication that was prescribed before.

## 2024-07-10 NOTE — Progress Notes (Signed)
 Molly Archer,  Your chest xray remains clear of any concern for pneumonia. I have placed an order for antibiotics to be picked up to treat your ear infection. Please remember to stay well hydrated and take a probiotic to help decrease GI effects. Because of your HTN, I cannot refill your cough medication. However, you can get Coricidin HBP over the counter to help with cough!  Thanks!  Lauraine Norris

## 2024-07-13 ENCOUNTER — Encounter (HOSPITAL_BASED_OUTPATIENT_CLINIC_OR_DEPARTMENT_OTHER): Payer: Self-pay | Admitting: Pulmonary Disease

## 2024-07-13 ENCOUNTER — Encounter (HOSPITAL_BASED_OUTPATIENT_CLINIC_OR_DEPARTMENT_OTHER): Payer: Self-pay

## 2024-07-13 ENCOUNTER — Other Ambulatory Visit (HOSPITAL_BASED_OUTPATIENT_CLINIC_OR_DEPARTMENT_OTHER): Payer: Self-pay

## 2024-07-13 ENCOUNTER — Other Ambulatory Visit (HOSPITAL_BASED_OUTPATIENT_CLINIC_OR_DEPARTMENT_OTHER): Payer: Self-pay | Admitting: Family Medicine

## 2024-07-13 ENCOUNTER — Ambulatory Visit (INDEPENDENT_AMBULATORY_CARE_PROVIDER_SITE_OTHER): Admitting: Pulmonary Disease

## 2024-07-13 VITALS — BP 136/78 | HR 94 | Temp 98.9°F | Ht 65.0 in | Wt 322.5 lb

## 2024-07-13 DIAGNOSIS — J4541 Moderate persistent asthma with (acute) exacerbation: Secondary | ICD-10-CM

## 2024-07-13 MED ORDER — FLUTICASONE-SALMETEROL 100-50 MCG/ACT IN AEPB
1.0000 | INHALATION_SPRAY | Freq: Two times a day (BID) | RESPIRATORY_TRACT | 5 refills | Status: DC
  Filled 2024-07-13: qty 60, 30d supply, fill #0

## 2024-07-13 MED ORDER — ALBUTEROL SULFATE (2.5 MG/3ML) 0.083% IN NEBU
3.0000 mL | INHALATION_SOLUTION | Freq: Four times a day (QID) | RESPIRATORY_TRACT | 1 refills | Status: AC | PRN
  Filled 2024-07-13: qty 300, 25d supply, fill #0

## 2024-07-13 MED ORDER — PREDNISONE 10 MG PO TABS
ORAL_TABLET | ORAL | 0 refills | Status: AC
  Filled 2024-07-13: qty 20, 8d supply, fill #0

## 2024-07-13 NOTE — Assessment & Plan Note (Addendum)
--  START Wixela 100-50 mcg ONE puff in the morning and evening. Rinse mouth out after use --CONTINUE Albuterol  TWO puffs as needed for shortness of breath and wheezing --Prednisone  taper ordered --ORDER pulmonary function tests --ORDER labs: CBC with diff, IgE  Asthma Action Plan Use albuterol  to 2 puffs three times a day for worsening shortness of breath, wheezing and cough. If you symptoms do not improve in 24-48 hours, please our office for evaluation and/or prednisone  taper.

## 2024-07-13 NOTE — Progress Notes (Signed)
 Subjective:   PATIENT ID: Molly Archer GENDER: female DOB: 09-23-1980, MRN: [id number]  Chief Complaint  Patient presents with   Consult    Asthma    Reason for Visit: Follow-up      Molly Archer is a 43 y.o. female never with asthma, DM2, HTN, GERD, hx spontaneous PTX peri-op, chronic headaches and lumbar radiculopathy and morbid obesity who presents for evaluation for asthma.     07/13/2024 Discussed the use of AI scribe software for clinical note transcription with the patient, who gave verbal consent to proceed.  History of Present Illness Molly Archer is a 43 year old female with asthma who presents with coughing, wheezing, and shortness of breath. She was referred by the hospital for evaluation of asthma exacerbation.  She has been experiencing coughing, wheezing, and shortness of breath since the week of Thanksgiving. Initially, she visited urgent care and was diagnosed with bronchitis, receiving a steroid, a breathing treatment, a Z-Pak, and cough medicine. Despite these treatments, her symptoms persisted, leading to a visit to the ER where she was wheezing severely. She was given the option to be admitted or go home; she chose to go home but was later admitted to [facility] due to severe symptoms.  She tested negative for COVID, flu, and RSV. Her white blood cell count was elevated, but no specific bacterial infection was identified. She has a history of asthma diagnosed in childhood but reports that significant symptoms began in her early thirties, with exacerbations typically occurring in the fall and spring.  She has been using inhalers since middle school, including albuterol , Proventil , Ventolin , and Advair  Disc. Recently, she has been using a nebulizer every six hours but reduced frequency due to throat soreness. She also uses levalbuterol , which she alternates with albuterol .  She denies smoking but is sensitive to smoke and strong odors, which can  trigger her asthma. 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 She has no pets and uses an air purifier and humidifier at home.  She reports a productive cough, especially at night. No fever or chills. She has a history of diabetes, which complicates steroid use due to potential blood sugar elevation.       No data to display           Social History: Never smoker    Past Medical History:  Diagnosis Date   Allergy    Anemia    pt states she took iron for this around 2019, and has had no issues since   Arthritis    knees   Asthma    only uses inhaler in fall and spring   Constipation    Diabetes mellitus without complication (HCC)    GERD (gastroesophageal reflux disease)    diet controlled, occ. uses omeprazole    Headache    otc med prn   Heart murmur    History of kidney stones    Hx of Bell's palsy 12/2011   Hypertension    Menometrorrhagia    Nausea and vomiting 04/14/2024   Psoriasis    Shortness of breath dyspnea    with exercise/exertion   Vitamin D  deficiency      Family History  Problem Relation Age of Onset   Hypertension Mother    Hyperlipidemia Mother    Diabetes Mother    Hypertension Father    Diabetes Father    Stroke Maternal Grandmother    Cancer Maternal Grandfather  Hypertension Other    Diabetes Other    Cancer Other    Cancer Maternal Aunt    Diabetes Maternal Aunt    Diabetes Maternal Uncle    Liver disease Neg Hx    Colon cancer Neg Hx    Esophageal cancer Neg Hx      Social History   Occupational History   Not on file  Tobacco Use   Smoking status: Never    Passive exposure: Never   Smokeless tobacco: Never   Tobacco comments:    Never Smoked  Vaping Use   Vaping status: Never Used  Substance and Sexual Activity   Alcohol use: Never   Drug use: Never   Sexual activity: Not Currently    Birth control/protection: Abstinence, Pill    Allergies[1]   Outpatient Medications Prior  to Visit  Medication Sig Dispense Refill   Accu-Chek Softclix Lancets lancets by Other route. Use as instructed     albuterol  (VENTOLIN  HFA) 108 (90 Base) MCG/ACT inhaler Inhale 2 puffs into the lungs every 4 (four) hours as needed for wheezing or shortness of breath (cough, shortness of breath or wheezing.). 18 g 1   Blood Glucose Monitoring Suppl (BLOOD GLUCOSE MONITOR SYSTEM) w/Device KIT Use as directed in the morning, at noon, and at bedtime. 1 kit 0   Ciclopirox  1 % shampoo Apply to the affected area(s) topically at bedtime. 120 mL 0   clobetasol  ointment (TEMOVATE ) 0.05 % Apply 1 Application topically 2 (two) times daily. 30 g 1   doxazosin  (CARDURA ) 1 MG tablet Take 1 tablet (1 mg total) by mouth daily. 30 tablet 3   Fluocinolone  Acetonide Scalp 0.01 % OIL Apply topically.     Fluocinolone  Acetonide Scalp 0.01 % OIL Apply 1 Application topically 2 (two) times a week. 118.28 mL 1   fluticasone  (FLONASE ) 50 MCG/ACT nasal spray Place 2 sprays into both nostrils daily. 16 g 6   Glucose Blood (BLOOD GLUCOSE TEST STRIPS 333 VI) 100 each by In Vitro route 2 (two) times daily before a meal.     hydrochlorothiazide  (MICROZIDE ) 12.5 MG capsule Take 1 capsule (12.5 mg total) by mouth daily. 90 capsule 1   insulin  glargine (LANTUS  SOLOSTAR) 100 UNIT/ML Solostar Pen Inject 12 Units into the skin daily. 15 mL 3   Insulin  Pen Needle (PEN NEEDLES) 32G X 4 MM MISC Use as directed for injecting insulin  100 each 2   irbesartan -hydrochlorothiazide  (AVALIDE) 300-12.5 MG tablet Take 1 tablet by mouth daily. 90 tablet 1   levalbuterol  (XOPENEX ) 0.63 MG/3ML nebulizer solution Take 3 mLs (0.63 mg total) by nebulization every 6 (six) hours for 8 days. 75 mL 0   levofloxacin  (LEVAQUIN ) 500 MG tablet Take 1 tablet (500 mg total) by mouth daily for 5 days. 5 tablet 0   metoCLOPramide  (REGLAN ) 10 MG tablet Take 1 tablet (10 mg total) by mouth every 8 (eight) hours as needed for Nausea 30 tablet 1   misoprostol  (CYTOTEC) 200 MCG tablet SMARTSIG:1 Tablet(s) By Mouth     Naftifine  HCl 2 % CREA Apply liberally to affected area daily 60 g 1   Naphazoline-Pheniramine (ALLERGY EYE OP) Place 1 drop into both eyes daily as needed (allergies).     norethindrone  (AYGESTIN ) 5 MG tablet Take 3 tablets (15 mg total) by mouth daily. 180 tablet 5   Oxycodone  HCl 10 MG TABS Take 1 tablet (10 mg total) by mouth every 8 (eight) hours as needed. 75 tablet 0   pantoprazole  (PROTONIX )  40 MG tablet Take 1 tablet (40 mg total) by mouth daily. 90 tablet 3   predniSONE  (STERAPRED UNI-PAK 21 TAB) 10 MG (21) TBPK tablet Use per package instruction 21 tablet 0   promethazine -dextromethorphan  (PROMETHAZINE -DM) 6.25-15 MG/5ML syrup Take 5 mLs by mouth every 6 (six) hours as needed. 180 mL 0   sucralfate  (CARAFATE ) 1 g tablet Take 1 tablet (1 g total) by mouth 4 (four) times daily. 360 tablet 1   tiZANidine  (ZANAFLEX ) 4 MG tablet Take 1 tablet (4 mg total) by mouth every 8 (eight) hours as needed for muscle spasms. 30 tablet 0   triamcinolone  cream (KENALOG ) 0.1 % Apply to the affected area(s) topically 2 (two) times daily. 30 g 0   zolpidem  (AMBIEN ) 5 MG tablet Take 1 tablet (5 mg total) by mouth at bedtime as needed for sleep. 30 tablet 0   albuterol  (PROVENTIL ) (2.5 MG/3ML) 0.083% nebulizer solution Inhale 3 mLs into the lungs every 6 (six) hours as needed for wheezing. 150 mL 0   dicyclomine  (BENTYL ) 10 MG capsule Take 1 capsule (10 mg total) by mouth 2 (two) times daily as needed for spasms. (Patient not taking: Reported on 07/13/2024) 60 capsule 1   famotidine  (PEPCID ) 20 MG tablet Take 1 tablet (20 mg total) by mouth 2 (two) times daily. (Patient not taking: Reported on 07/13/2024) 180 tablet 3   lidocaine  (XYLOCAINE ) 2 % solution Use as directed 15 mLs in the mouth or throat every 4 (four) hours as needed. (Patient not taking: Reported on 07/13/2024) 270 mL 0   nitroGLYCERIN  (NITRODUR - DOSED IN MG/24 HR) 0.1 mg/hr patch Place 1  patch (0.1 mg total) onto the skin daily. (Patient not taking: Reported on 07/13/2024) 30 patch 12   No facility-administered medications prior to visit.    Review of Systems  Constitutional:  Negative for chills, diaphoresis, fever, malaise/fatigue and weight loss.  HENT:  Negative for congestion.   Respiratory:  Positive for cough, sputum production, shortness of breath and wheezing. Negative for hemoptysis.   Cardiovascular:  Negative for chest pain, palpitations and leg swelling.     Objective:   Vitals:   07/13/24 0823  BP: 136/78  Pulse: 94  Temp: 98.9 F (37.2 C)  SpO2: 99%  Weight: (!) 322 lb 8 oz (146.3 kg)  Height: 5' 5 (1.651 m)   SpO2: 99 %  Physical Exam: General: Well-appearing, no acute distress HENT: [place], AT Eyes: EOMI, no scleral icterus Respiratory: Clear to auscultation bilaterally.  No crackles, wheezing or rales Cardiovascular: RRR, -M/R/G, no JVD Extremities:-Edema,-tenderness Neuro: AAO x4, CNII-XII grossly intact Psych: Normal mood, normal affect  Data Reviewed:  Imaging: CT Chest 01/02/24 - Small right PTX with right chest tube, subcutaneous emphysema in right chest wall. Consolidation in right lung.  CXR 01/05/24 - Resolved ptx  CT CAP 02/21/24 - Tiny small, subcentimeter locule of gas along the medial right middle lobe. Trace left pleural effusion with left basilar atelectasis. Right apical calcified granuloma. 3 mm nodular opacity in the subpleural RUL  PFT: None on file  Labs:    Latest Ref Rng & Units 06/30/2024    3:14 AM 06/29/2024    6:34 PM 06/29/2024   11:10 AM  CBC  WBC 4.0 - 10.5 K/uL 11.2  12.0    Hemoglobin 12.0 - 15.0 g/dL 85.9  85.8  84.6   Hematocrit 36.0 - 46.0 % 41.3  42.1  45.0   Platelets 150 - 400 K/uL 361  359  Latest Ref Rng & Units 06/30/2024    3:14 AM 06/29/2024    6:34 PM 06/29/2024   11:10 AM  CMP  Glucose 70 - 99 mg/dL 706     BUN 6 - 20 mg/dL 13     Creatinine 9.55 - 1.00 mg/dL 9.26  8.94     Sodium 864 - 145 mmol/L 133   134   Potassium 3.5 - 5.1 mmol/L 3.9   3.4   Chloride 98 - 111 mmol/L 97     CO2 22 - 32 mmol/L 24     Calcium 8.9 - 10.3 mg/dL 8.7           Assessment & Plan:   Discussion: 43 y.o. female never with asthma, DM2, HTN, GERD, hx spontaneous PTX peri-op, chronic headaches and lumbar radiculopathy and morbid obesity who presents for evaluation for asthma. Currently in mild exacerbation. Historically has 1-2 exacerbations annually and would likely benefit from maintenance therapy. Discussed clinical course and management of asthma including bronchodilator regimen, preventive care including vaccinations and action plan for exacerbation.  Previously tried Advair  Diskus last year with partial relief   Assessment & Plan Moderate persistent asthma with acute exacerbation --START Wixela 100-50 mcg ONE puff in the morning and evening. Rinse mouth out after use --CONTINUE Albuterol  TWO puffs as needed for shortness of breath and wheezing --Prednisone  taper ordered --ORDER pulmonary function tests --ORDER labs: CBC with diff, IgE  Asthma Action Plan Use albuterol  to 2 puffs three times a day for worsening shortness of breath, wheezing and cough. If you symptoms do not improve in 24-48 hours, please our office for evaluation and/or prednisone  taper.   Health Maintenance Immunization History  Administered Date(s) Administered   Influenza,inj,Quad PF,6+ Mos 06/03/2019   MMR 07/03/2008   PPD Test 04/29/2019   Tdap 07/03/2008   CT Lung Screen - never smoker. Not qualified  Orders Placed This Encounter  Procedures   CBC with Differential   IgE   Meds ordered this encounter  Medications   predniSONE  (DELTASONE ) 10 MG tablet    Sig: Take 4 tablets (40 mg total) by mouth daily with breakfast for 2 days, THEN 3 tablets (30 mg total) daily with breakfast for 2 days, THEN 2 tablets (20 mg total) daily with breakfast for 2 days, THEN 1 tablet (10 mg total) daily  with breakfast for 2 days.    Dispense:  20 tablet    Refill:  0   albuterol  (PROVENTIL ) (2.5 MG/3ML) 0.083% nebulizer solution    Sig: Inhale 3 mLs into the lungs every 6 (six) hours as needed for wheezing.    Dispense:  360 mL    Refill:  1    Return in 6 weeks (on 08/24/2024) for after PFT.  I have spent a total time of 45-minutes on the day of the appointment reviewing prior documentation, coordinating care and discussing medical diagnosis and plan with the patient/family. Imaging, labs and tests included in this note have been reviewed and interpreted independently by me. This note is generated using [employer] programming. Patient/family has given consent.  Windsor Goeken Slater Staff, MD [facility] 07/13/2024 9:02 AM        [1]  Allergies Allergen Reactions   Clindamycin     Codeine Hives   Doxycycline  Nausea Only    Pt states causes nausea and vomiting.   Losartan Other (See Comments)    headache   Penicillins Hives   Tramadol  Hives

## 2024-07-13 NOTE — Patient Instructions (Signed)
 Moderate persistent asthma with acute exacerbation --START Wixela 100-50 mcg ONE puff in the morning and evening. Rinse mouth out after use --CONTINUE Albuterol  TWO puffs as needed for shortness of breath and wheezing --Prednisone  taper ordered --ORDER pulmonary function tests  Asthma Action Plan Use albuterol  to 2 puffs three times a day for worsening shortness of breath, wheezing and cough. If you symptoms do not improve in 24-48 hours, please our office for evaluation and/or prednisone  taper.

## 2024-07-15 LAB — CBC WITH DIFFERENTIAL/PLATELET
Basophils Absolute: 0.1 x10E3/uL (ref 0.0–0.2)
Basos: 1 %
EOS (ABSOLUTE): 0.2 x10E3/uL (ref 0.0–0.4)
Eos: 2 %
Hematocrit: 45.3 % (ref 34.0–46.6)
Hemoglobin: 14.6 g/dL (ref 11.1–15.9)
Immature Grans (Abs): 0.1 x10E3/uL (ref 0.0–0.1)
Immature Granulocytes: 1 %
Lymphocytes Absolute: 1.4 x10E3/uL (ref 0.7–3.1)
Lymphs: 19 %
MCH: 28.3 pg (ref 26.6–33.0)
MCHC: 32.2 g/dL (ref 31.5–35.7)
MCV: 88 fL (ref 79–97)
Monocytes Absolute: 0.6 x10E3/uL (ref 0.1–0.9)
Monocytes: 9 %
Neutrophils Absolute: 5 x10E3/uL (ref 1.4–7.0)
Neutrophils: 68 %
Platelets: 330 x10E3/uL (ref 150–450)
RBC: 5.16 x10E6/uL (ref 3.77–5.28)
RDW: 13.9 % (ref 11.7–15.4)
WBC: 7.4 x10E3/uL (ref 3.4–10.8)

## 2024-07-15 LAB — IGE: IgE (Immunoglobulin E), Serum: 56 [IU]/mL (ref 6–495)

## 2024-07-16 ENCOUNTER — Ambulatory Visit (HOSPITAL_COMMUNITY): Admitting: Licensed Clinical Social Worker

## 2024-07-16 NOTE — Progress Notes (Signed)
 [facility] LCSW Note  07/16/2024   8:36 AM  Type of Contact:  Scheduled Intake  Pt erroneously scheduled for intake [facility] to establish care. Pt is already established with LCSW via [facility] [place] and comfortable with continuing services. Pt linked with APP with office to initially explore further options surrounding medication options outside of prior PCP efforts.  Lynwood JONETTA Maris, LCSW 07/16/2024  8:36 AM

## 2024-07-17 ENCOUNTER — Ambulatory Visit (HOSPITAL_BASED_OUTPATIENT_CLINIC_OR_DEPARTMENT_OTHER): Payer: Self-pay | Admitting: Pulmonary Disease

## 2024-07-20 ENCOUNTER — Encounter (HOSPITAL_BASED_OUTPATIENT_CLINIC_OR_DEPARTMENT_OTHER): Payer: Self-pay | Admitting: Family Medicine

## 2024-07-20 ENCOUNTER — Ambulatory Visit (INDEPENDENT_AMBULATORY_CARE_PROVIDER_SITE_OTHER)

## 2024-07-20 ENCOUNTER — Other Ambulatory Visit (HOSPITAL_BASED_OUTPATIENT_CLINIC_OR_DEPARTMENT_OTHER): Payer: Self-pay

## 2024-07-20 ENCOUNTER — Encounter (INDEPENDENT_AMBULATORY_CARE_PROVIDER_SITE_OTHER): Payer: Self-pay

## 2024-07-20 ENCOUNTER — Encounter (HOSPITAL_BASED_OUTPATIENT_CLINIC_OR_DEPARTMENT_OTHER): Payer: Self-pay

## 2024-07-20 VITALS — BP 164/74 | HR 72 | Ht 65.0 in | Wt 328.2 lb

## 2024-07-20 DIAGNOSIS — H6593 Unspecified nonsuppurative otitis media, bilateral: Secondary | ICD-10-CM

## 2024-07-20 DIAGNOSIS — I10 Essential (primary) hypertension: Secondary | ICD-10-CM

## 2024-07-20 MED ORDER — NIFEDIPINE ER OSMOTIC RELEASE 60 MG PO TB24
120.0000 mg | ORAL_TABLET | Freq: Every day | ORAL | 0 refills | Status: DC
  Filled 2024-07-20: qty 60, 30d supply, fill #0

## 2024-07-20 MED ORDER — LEVOFLOXACIN 500 MG PO TABS
500.0000 mg | ORAL_TABLET | Freq: Every day | ORAL | 0 refills | Status: AC
  Filled 2024-07-20: qty 5, 5d supply, fill #0

## 2024-07-20 NOTE — Progress Notes (Signed)
 "     Acute Care Office Visit  Subjective:   Molly Archer 1980/10/26 07/20/2024  Chief Complaint  Patient presents with   Follow-up    Patient is here for for headache and throbbing ear pain on the left side for about 3 days. States she has been coughing up mucus and been feeling nauseous. Right eye became blurry 4 days ago. Would like her A1C checked.     HPI: Pt states she has had a lot of nasal congestion and post nasal drip. Pt states she has had a headache since Friday with throbbing in her ear. States both ear and head started hurting Friday. States she tried one of her oxycodone  for relief, but it did not help the pain. Pt recently on Levoquin which she finished Monday.  HTN: Patients states her Nifedine needs to be refilled as she has been out and tried to get another prescription. Has not followed with cardiology recently. Denies chest pain, shob, and headache.    The following portions of the patient's history were reviewed and updated as appropriate: past medical history, past surgical history, family history, social history, allergies, medications, and problem list.   Patient Active Problem List   Diagnosis Date Noted   Acute asthma exacerbation 06/29/2024   Diverticulosis of colon without hemorrhage 05/06/2024   Diarrhea 05/06/2024   Gastroesophageal reflux disease 04/14/2024   Nausea and vomiting 04/14/2024   Dysphagia 04/14/2024   Physical deconditioning 01/08/2024   Chronic prescription opiate use 01/03/2024   Primary spontaneous pneumothorax 01/02/2024   Constipation 09/25/2023   Early awakening 08/26/2023   Lumbar radiculopathy 07/17/2023   Adjustment disorder with anxious mood 05/16/2023   Primary osteoarthritis of right knee 05/15/2023   Right hip pain 05/15/2023   Spondylosis of lumbar spine 05/15/2023   Cervical spondylolysis 04/16/2023   Cervical sprain 04/16/2023   Haglund's deformity of left heel 04/16/2023   Intracranial hypertension  03/04/2023   Insulin  dependent type 2 diabetes mellitus (HCC) 03/04/2023   Primary insomnia 09/19/2022   Migraine without aura and without status migrainosus, not intractable 05/02/2020   Complex tear of lateral meniscus of left knee as current injury 12/22/2019   Iron overload 12/21/2019   Derangement of left knee 09/08/2019   Dermatitis 12/18/2018   Vitamin D  deficiency 09/25/2018   Absolute anemia 09/25/2018   Class 3 severe obesity with serious comorbidity and body mass index (BMI) of 50.0 to 59.9 in adult [facility]) 09/25/2018   Thickened endometrium 07/20/2018   History of complex endometrial hyperplasia 07/20/2018   Morbid obesity with BMI of 50.0-59.9, adult (HCC) 07/20/2018   Abnormal uterine bleeding (AUB)    Essential hypertension 02/25/2012   Hx of Bell's palsy 02/25/2012   Past Medical History:  Diagnosis Date   Allergy    Anemia    pt states she took iron for this around 2019, and has had no issues since   Arthritis    knees   Asthma    only uses inhaler in fall and spring   Constipation    Diabetes mellitus without complication (HCC)    GERD (gastroesophageal reflux disease)    diet controlled, occ. uses omeprazole    Headache    otc med prn   Heart murmur    History of kidney stones    Hx of Bell's palsy 12/2011   Hypertension    Menometrorrhagia    Nausea and vomiting 04/14/2024   Psoriasis    Shortness of breath dyspnea    with exercise/exertion  Vitamin D  deficiency    Past Surgical History:  Procedure Laterality Date   CHOLECYSTECTOMY N/A 01/01/2024   Procedure: LAPAROSCOPIC CHOLECYSTECTOMY;  Surgeon: Tanda Locus, MD;  Location: [facility] OR;  Service: General;  Laterality: N/A;   COLONOSCOPY N/A 05/06/2024   Procedure: COLONOSCOPY;  Surgeon: Abran Norleen SAILOR, MD;  Location: [facility];  Service: Gastroenterology;  Laterality: N/A;   COMBINED HYSTEROSCOPY DIAGNOSTIC / D&C N/A 07/30/2009   Benign secretory endometrium   CYSTOSCOPY WITH STENT PLACEMENT Left  10/19/2014   Procedure: CYSTOSCOPY WITH STENT PLACEMENT;  Surgeon: Morene LELON Salines, MD;  Location: [facility];  Service: Urology;  Laterality: Left;   DILATION AND CURETTAGE OF UTERUS  10/29/2003   Polyp, mild atypia, simple and complex hyperplasia,   DILATION AND CURETTAGE OF UTERUS  11/28/2003   Simple and complex hyperplasia   DILATION AND CURETTAGE OF UTERUS  07/30/2009   DILATION AND CURETTAGE OF UTERUS N/A 12/14/2014   Procedure: DILATATION AND CURETTAGE;  Surgeon: Winton Felt, MD;  Location: [facility];  Service: Gynecology;  Laterality: N/A;   ESOPHAGOGASTRODUODENOSCOPY N/A 05/06/2024   Procedure: EGD (ESOPHAGOGASTRODUODENOSCOPY);  Surgeon: Abran Norleen SAILOR, MD;  Location: [facility];  Service: Gastroenterology;  Laterality: N/A;   HOLMIUM LASER APPLICATION Left 10/19/2014   Procedure: HOLMIUM LASER APPLICATION;  Surgeon: Morene LELON Salines, MD;  Location: [facility];  Service: Urology;  Laterality: Left;   HYSTEROSCOPY WITH D & C N/A 07/21/2018   Procedure: DILATATION AND CURETTAGE /HYSTEROSCOPY;  Surgeon: Eveline Lynwood MATSU, MD;  Location: [facility];  Service: Gynecology;  Laterality: N/A;   INTRAUTERINE DEVICE (IUD) INSERTION N/A 12/14/2014   Procedure: INTRAUTERINE DEVICE (IUD) INSERTION;  Surgeon: Winton Felt, MD;  Location: [facility];  Service: Gynecology;  Laterality: N/A;   KNEE CARTILAGE SURGERY Left    STONE EXTRACTION WITH BASKET Left 10/19/2014   Procedure: STONE EXTRACTION WITH BASKET;  Surgeon: Morene LELON Salines, MD;  Location: [facility];  Service: Urology;  Laterality: Left;   WISDOM TOOTH EXTRACTION     Family History  Problem Relation Age of Onset   Hypertension Mother    Hyperlipidemia Mother    Diabetes Mother    Hypertension Father    Diabetes Father    Stroke Maternal Grandmother    Cancer Maternal Grandfather    Hypertension Other    Diabetes Other    Cancer Other    Cancer Maternal Aunt    Diabetes Maternal Aunt    Diabetes Maternal Uncle    Liver disease Neg Hx     Colon cancer Neg Hx    Esophageal cancer Neg Hx    Outpatient Medications Prior to Visit  Medication Sig Dispense Refill   Accu-Chek Softclix Lancets lancets by Other route. Use as instructed     albuterol  (PROVENTIL ) (2.5 MG/3ML) 0.083% nebulizer solution Inhale 3 mLs into the lungs every 6 (six) hours as needed for wheezing. 360 mL 1   albuterol  (VENTOLIN  HFA) 108 (90 Base) MCG/ACT inhaler Inhale 2 puffs into the lungs every 4 (four) hours as needed for wheezing or shortness of breath (cough, shortness of breath or wheezing.). 18 g 1   Blood Glucose Monitoring Suppl (BLOOD GLUCOSE MONITOR SYSTEM) w/Device KIT Use as directed in the morning, at noon, and at bedtime. 1 kit 0   Ciclopirox  1 % shampoo Apply to the affected area(s) topically at bedtime. 120 mL 0   clobetasol  ointment (TEMOVATE ) 0.05 % Apply 1 Application topically 2 (two) times daily. 30 g 1   doxazosin  (CARDURA ) 1  MG tablet Take 1 tablet (1 mg total) by mouth daily. 30 tablet 3   Fluocinolone  Acetonide Scalp 0.01 % OIL Apply topically.     Fluocinolone  Acetonide Scalp 0.01 % OIL Apply 1 Application topically 2 (two) times a week. 118.28 mL 1   fluticasone  (FLONASE ) 50 MCG/ACT nasal spray Place 2 sprays into both nostrils daily. 16 g 6   fluticasone -salmeterol (WIXELA INHUB ) 100-50 MCG/ACT AEPB Inhale 1 puff into the lungs 2 (two) times daily. 60 each 5   Glucose Blood (BLOOD GLUCOSE TEST STRIPS 333 VI) 100 each by In Vitro route 2 (two) times daily before a meal.     hydrochlorothiazide  (MICROZIDE ) 12.5 MG capsule Take 1 capsule (12.5 mg total) by mouth daily. 90 capsule 1   insulin  glargine (LANTUS  SOLOSTAR) 100 UNIT/ML Solostar Pen Inject 12 Units into the skin daily. 15 mL 3   Insulin  Pen Needle (PEN NEEDLES) 32G X 4 MM MISC Use as directed for injecting insulin  100 each 2   irbesartan -hydrochlorothiazide  (AVALIDE) 300-12.5 MG tablet Take 1 tablet by mouth daily. 90 tablet 1   metoCLOPramide  (REGLAN ) 10 MG tablet Take 1  tablet (10 mg total) by mouth every 8 (eight) hours as needed for Nausea 30 tablet 1   misoprostol (CYTOTEC) 200 MCG tablet SMARTSIG:1 Tablet(s) By Mouth     Naftifine  HCl 2 % CREA Apply liberally to affected area daily 60 g 1   Naphazoline-Pheniramine (ALLERGY EYE OP) Place 1 drop into both eyes daily as needed (allergies).     NIFEdipine  (ADALAT  CC) 60 MG 24 hr tablet Take 60 mg by mouth daily.     norethindrone  (AYGESTIN ) 5 MG tablet Take 3 tablets (15 mg total) by mouth daily. 180 tablet 5   Oxycodone  HCl 10 MG TABS Take 1 tablet (10 mg total) by mouth every 8 (eight) hours as needed. 75 tablet 0   pantoprazole  (PROTONIX ) 40 MG tablet Take 1 tablet (40 mg total) by mouth daily. 90 tablet 3   predniSONE  (DELTASONE ) 10 MG tablet Take 4 tablets (40 mg total) by mouth daily with breakfast for 2 days, THEN 3 tablets (30 mg total) daily with breakfast for 2 days, THEN 2 tablets (20 mg total) daily with breakfast for 2 days, THEN 1 tablet (10 mg total) daily with breakfast for 2 days. 20 tablet 0   predniSONE  (STERAPRED UNI-PAK 21 TAB) 10 MG (21) TBPK tablet Use per package instruction 21 tablet 0   sucralfate  (CARAFATE ) 1 g tablet Take 1 tablet (1 g total) by mouth 4 (four) times daily. 360 tablet 1   tiZANidine  (ZANAFLEX ) 4 MG tablet Take 1 tablet (4 mg total) by mouth every 8 (eight) hours as needed for muscle spasms. 30 tablet 0   triamcinolone  cream (KENALOG ) 0.1 % Apply to the affected area(s) topically 2 (two) times daily. 30 g 0   zolpidem  (AMBIEN ) 5 MG tablet Take 1 tablet (5 mg total) by mouth at bedtime as needed for sleep. 30 tablet 0   dicyclomine  (BENTYL ) 10 MG capsule Take 1 capsule (10 mg total) by mouth 2 (two) times daily as needed for spasms. (Patient not taking: Reported on 07/20/2024) 60 capsule 1   famotidine  (PEPCID ) 20 MG tablet Take 1 tablet (20 mg total) by mouth 2 (two) times daily. (Patient not taking: Reported on 07/20/2024) 180 tablet 3   levalbuterol  (XOPENEX ) 0.63 MG/3ML  nebulizer solution Take 3 mLs (0.63 mg total) by nebulization every 6 (six) hours for 8 days. (Patient not taking: Reported  on 07/20/2024) 75 mL 0   lidocaine  (XYLOCAINE ) 2 % solution Use as directed 15 mLs in the mouth or throat every 4 (four) hours as needed. (Patient not taking: Reported on 07/20/2024) 270 mL 0   nitroGLYCERIN  (NITRODUR - DOSED IN MG/24 HR) 0.1 mg/hr patch Place 1 patch (0.1 mg total) onto the skin daily. (Patient not taking: Reported on 07/20/2024) 30 patch 12   promethazine -dextromethorphan  (PROMETHAZINE -DM) 6.25-15 MG/5ML syrup Take 5 mLs by mouth every 6 (six) hours as needed. (Patient not taking: Reported on 07/20/2024) 180 mL 0   No facility-administered medications prior to visit.   Allergies[1]   ROS: A complete ROS was performed with pertinent positives/negatives noted in the HPI. The remainder of the ROS are negative.    Objective:   Today's Vitals   07/20/24 1450  BP: (!) 164/74  Pulse: 72  SpO2: 97%  Weight: (!) 328 lb 3.2 oz (148.9 kg)  Height: 5' 5 (1.651 m)    GENERAL: Well-appearing, in NAD. Well nourished.  EARS: Tympanic membranes are red, swollen with effusion noted to left ear.  NOSE: Septum midline w/o deformity. Nares patent, mucosa pink and non-inflamed w/o drainage. No sinus tenderness.  THROAT: Uvula midline. Oropharynx clear. Tonsils non-inflamed without exudate. Mucous membranes pink and moist.  RESPIRATORY: Chest wall symmetrical. Respirations even and non-labored. Breath sounds clear to auscultation bilaterally.  CARDIAC: S1, S2 present, regular rate and rhythm without murmur or gallops. Peripheral pulses 2+ bilaterally.    No results found for any visits on 07/20/24.    Assessment & Plan:  1. Otitis media with effusion, bilateral (Primary) Discussed trying another short term antibiotic to see if longer therapy is needed. Also discussed referral for ENT in the setting of her multiple allergies and continued symptoms. Discussed  continuing flonase  and nasal rinse. - levofloxacin  (LEVAQUIN ) 500 MG tablet; Take 1 tablet (500 mg total) by mouth daily for 5 days.  Dispense: 5 tablet; Refill: 0 - Ambulatory referral to ENT  2. Essential hypertension Discussed reordering patient's current regimen but I spoke with the patient about following with cardiology as I was unable to find a specific note on what they want her regimen to be. Denies any red flag symptoms. Discussed when to follow up with PCP or ER. - NIFEdipine  (PROCARDIA  XL/NIFEDICAL XL) 60 MG 24 hr tablet; Take 2 tablets (120 mg total) by mouth daily.  Dispense: 60 tablet; Refill: 0  Return if symptoms worsen or fail to improve.    Patient to reach out to office if new, worrisome, or unresolved symptoms arise or if no improvement in patient's condition. Patient verbalized understanding and is agreeable to treatment plan. All questions answered to patient's satisfaction.    Lauraine Almarie Angus DNP, FNP-C       [1]  Allergies Allergen Reactions   Clindamycin     Codeine Hives   Doxycycline  Nausea Only    Pt states causes nausea and vomiting.   Losartan Other (See Comments)    headache   Penicillins Hives   Tramadol  Hives   "

## 2024-07-21 ENCOUNTER — Ambulatory Visit: Admitting: Licensed Clinical Social Worker

## 2024-07-22 ENCOUNTER — Ambulatory Visit (HOSPITAL_COMMUNITY): Admitting: Family

## 2024-07-22 ENCOUNTER — Encounter (HOSPITAL_BASED_OUTPATIENT_CLINIC_OR_DEPARTMENT_OTHER): Payer: Self-pay

## 2024-07-22 ENCOUNTER — Other Ambulatory Visit (HOSPITAL_BASED_OUTPATIENT_CLINIC_OR_DEPARTMENT_OTHER): Payer: Self-pay | Admitting: Family Medicine

## 2024-07-22 ENCOUNTER — Other Ambulatory Visit (HOSPITAL_BASED_OUTPATIENT_CLINIC_OR_DEPARTMENT_OTHER): Payer: Self-pay

## 2024-07-22 DIAGNOSIS — F321 Major depressive disorder, single episode, moderate: Secondary | ICD-10-CM | POA: Diagnosis not present

## 2024-07-22 DIAGNOSIS — G479 Sleep disorder, unspecified: Secondary | ICD-10-CM | POA: Diagnosis not present

## 2024-07-22 MED ORDER — TRAZODONE HCL 50 MG PO TABS
50.0000 mg | ORAL_TABLET | Freq: Every day | ORAL | 0 refills | Status: DC
  Filled 2024-07-22: qty 30, 30d supply, fill #0

## 2024-07-22 NOTE — Progress Notes (Addendum)
 " Psychiatric Initial Adult Assessment   Patient Identification: Molly Archer MRN:  [id number] Date of Evaluation:  07/22/2024 Referral Source: Primary care provider Chief Complaint: Sleep disturbance  Visit Diagnosis:    ICD-10-CM   1. Major depressive disorder, single episode, moderate (HCC)  F32.1     2. Sleep disturbance  G47.9       History of Present Illness:  Molly Archer 43 year African-American female was seen and evaluated via caregility.  (Virtual platform) she presents to establish care.  Reports her most pressing concern/issue is related to sleep.  Does report a history of major depressive disorder and generalized anxiety disorder.  Reports more recently she has been struggling with sleep disturbance.  She is currently prescribed tizanidine  and zolpidem .  She reports taking however, still continues to wake up around 3:00-4:00am in the morning and not able to go back to sleep.  Reported that she is been for a sleep study in the past.  To recall any other medications that she is taking for  sleep or mood disturbance. Molly Archer has a medical history with diabetes, chronic pain, hypertension and diverticulosis.   Molly Archer reports she is currently followed by therapist Damien, LCSW through [facility].  Reports stressors that has been attributing to her depression to include chronic pain related to knee surgery.  Reports she is currently followed by pain management.  Reports recently they have been ministry for the past 10 years. 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  I am working not stop.   Molly Archer denied previous inpatient admissions.  Denied illicit drug use or substance abuse history.  Denied that she is currently prescribed psychotropic medications.  Denied family history related to mental illness  Molly Archer observed via virtual platform care agility; she is alert/oriented x 3; calm/cooperative; and mood  congruent with affect.  Patient is speaking in a clear tone at moderate volume, and normal pace; with good eye contact. Her thought process is coherent and relevant; There is no indication that she is currently responding to internal/external stimuli or experiencing delusional thought content.  Patient denies suicidal/self-harm/homicidal ideation, psychosis, and paranoia.  Patient has remained calm throughout assessment and has answered questions appropriately.    Associated Signs/Symptoms: Depression Symptoms:  depressed mood, anxiety, (Hypo) Manic Symptoms:  Denied Anxiety Symptoms:  Excessive Worry, Psychotic Symptoms:  Hallucinations: None PTSD Symptoms: NA  Past Psychiatric History:with depression, anxiety and insomnia. Stated that she is currently taken Zanaflex  and Amien to help with sleep and continues to only get 4 or 5 hours with medication combination.  Denied previous inpatient admission. Denied illicit substance use/abuse.   Previous Psychotropic Medications: No   Substance Abuse History in the last 12 months:  No.  Consequences of Substance Abuse: NA  Past Medical History:  Past Medical History:  Diagnosis Date   Allergy    Anemia    pt states she took iron for this around 2019, and has had no issues since   Arthritis    knees   Asthma    only uses inhaler in fall and spring   Constipation    Diabetes mellitus without complication (HCC)    GERD (gastroesophageal reflux disease)    diet controlled, occ. uses omeprazole    Headache    otc med prn   Heart murmur    History of kidney stones    Hx of Bell's palsy 12/2011   Hypertension    Menometrorrhagia  Nausea and vomiting 04/14/2024   Psoriasis    Shortness of breath dyspnea    with exercise/exertion   Vitamin D  deficiency     Past Surgical History:  Procedure Laterality Date   CHOLECYSTECTOMY N/A 01/01/2024   Procedure: LAPAROSCOPIC CHOLECYSTECTOMY;  Surgeon: Tanda Locus, MD;  Location: [facility] OR;  Service:  General;  Laterality: N/A;   COLONOSCOPY N/A 05/06/2024   Procedure: COLONOSCOPY;  Surgeon: Abran Norleen SAILOR, MD;  Location: [facility];  Service: Gastroenterology;  Laterality: N/A;   COMBINED HYSTEROSCOPY DIAGNOSTIC / D&C N/A 07/30/2009   Benign secretory endometrium   CYSTOSCOPY WITH STENT PLACEMENT Left 10/19/2014   Procedure: CYSTOSCOPY WITH STENT PLACEMENT;  Surgeon: Morene LELON Salines, MD;  Location: [facility];  Service: Urology;  Laterality: Left;   DILATION AND CURETTAGE OF UTERUS  10/29/2003   Polyp, mild atypia, simple and complex hyperplasia,   DILATION AND CURETTAGE OF UTERUS  11/28/2003   Simple and complex hyperplasia   DILATION AND CURETTAGE OF UTERUS  07/30/2009   DILATION AND CURETTAGE OF UTERUS N/A 12/14/2014   Procedure: DILATATION AND CURETTAGE;  Surgeon: Winton Felt, MD;  Location: [facility];  Service: Gynecology;  Laterality: N/A;   ESOPHAGOGASTRODUODENOSCOPY N/A 05/06/2024   Procedure: EGD (ESOPHAGOGASTRODUODENOSCOPY);  Surgeon: Abran Norleen SAILOR, MD;  Location: [facility];  Service: Gastroenterology;  Laterality: N/A;   HOLMIUM LASER APPLICATION Left 10/19/2014   Procedure: HOLMIUM LASER APPLICATION;  Surgeon: Morene LELON Salines, MD;  Location: [facility];  Service: Urology;  Laterality: Left;   HYSTEROSCOPY WITH D & C N/A 07/21/2018   Procedure: DILATATION AND CURETTAGE /HYSTEROSCOPY;  Surgeon: Eveline Lynwood MATSU, MD;  Location: [facility];  Service: Gynecology;  Laterality: N/A;   INTRAUTERINE DEVICE (IUD) INSERTION N/A 12/14/2014   Procedure: INTRAUTERINE DEVICE (IUD) INSERTION;  Surgeon: Winton Felt, MD;  Location: [facility];  Service: Gynecology;  Laterality: N/A;   KNEE CARTILAGE SURGERY Left    STONE EXTRACTION WITH BASKET Left 10/19/2014   Procedure: STONE EXTRACTION WITH BASKET;  Surgeon: Morene LELON Salines, MD;  Location: [facility];  Service: Urology;  Laterality: Left;   WISDOM TOOTH EXTRACTION      Family Psychiatric History:   Family History:  Family History  Problem  Relation Age of Onset   Hypertension Mother    Hyperlipidemia Mother    Diabetes Mother    Hypertension Father    Diabetes Father    Stroke Maternal Grandmother    Cancer Maternal Grandfather    Hypertension Other    Diabetes Other    Cancer Other    Cancer Maternal Aunt    Diabetes Maternal Aunt    Diabetes Maternal Uncle    Liver disease Neg Hx    Colon cancer Neg Hx    Esophageal cancer Neg Hx     Social History:   Social History   Socioeconomic History   Marital status: Single    Spouse name: Not on file   Number of children: 0   Years of education: Not on file   Highest education level: Bachelor's degree (e.g., BA, AB, BS)  Occupational History   Not on file  Tobacco Use   Smoking status: Never    Passive exposure: Never   Smokeless tobacco: Never   Tobacco comments:    Never Smoked  Vaping Use   Vaping status: Never Used  Substance and Sexual Activity   Alcohol use: Never   Drug use: Never   Sexual activity: Not Currently    Birth control/protection: Abstinence, Pill  Other Topics Concern   Not on file  Social History Narrative   She is from [place], [place], moved here 1998.    Lives at home by herself.    Social Drivers of Health   Tobacco Use: Low Risk (07/20/2024)   Patient History    Smoking Tobacco Use: Never    Smokeless Tobacco Use: Never    Passive Exposure: Never  Financial Resource Strain: Medium Risk (06/28/2024)   Overall Financial Resource Strain (CARDIA)    Difficulty of Paying Living Expenses: Somewhat hard  Food Insecurity: No Food Insecurity (06/29/2024)   Epic    Worried About Programme Researcher, Broadcasting/film/video in the Last Year: Never true    Ran Out of Food in the Last Year: Never true  Recent Concern: Food Insecurity - Food Insecurity Present (06/28/2024)   Epic    Worried About Programme Researcher, Broadcasting/film/video in the Last Year: Sometimes true    [employer] of Food in the Last Year: Sometimes true  Transportation Needs: No Transportation Needs (06/29/2024)    Epic    Lack of Transportation (Medical): No    Lack of Transportation (Non-Medical): No  Physical Activity: Insufficiently Active (06/28/2024)   Exercise Vital Sign    Days of Exercise per Week: 2 days    Minutes of Exercise per Session: 20 min  Stress: Stress Concern Present (06/28/2024)   [employer] of Occupational Health - Occupational Stress Questionnaire    Feeling of Stress: Very much  Social Connections: Moderately Isolated (06/28/2024)   Social Connection and Isolation Panel    Frequency of Communication with Friends and Family: Twice a week    Frequency of Social Gatherings with Friends and Family: Once a week    Attends Religious Services: 1 to 4 times per year    Active Member of Clubs or Organizations: No    Attends Banker Meetings: Not on file    Marital Status: Never married  Depression (PHQ2-9): Medium Risk (07/22/2024)   Depression (PHQ2-9)    PHQ-2 Score: 10  Alcohol Screen: Low Risk (07/20/2024)   Alcohol Screen    Last Alcohol Screening Score (AUDIT): 0  Housing: Low Risk (06/29/2024)   Epic    Unable to Pay for Housing in the Last Year: No    Number of Times Moved in the Last Year: 0    Homeless in the Last Year: No  Recent Concern: Housing - High Risk (06/28/2024)   Epic    Unable to Pay for Housing in the Last Year: Yes    Number of Times Moved in the Last Year: 0    Homeless in the Last Year: No  Utilities: Not At Risk (06/29/2024)   Epic    Threatened with loss of utilities: No  Health Literacy: Adequate Health Literacy (07/20/2024)   B1300 Health Literacy    Frequency of need for help with medical instructions: Never    Additional Social History:   Allergies:  Allergies[1]  Metabolic Disorder Labs: Lab Results  Component Value Date   HGBA1C 9.8 (A) 04/02/2024   HGBA1C 9.8 04/02/2024   Lab Results  Component Value Date   PROLACTIN 3.9 10/21/2013   Lab Results  Component Value Date   CHOL 197 01/12/2019   TRIG 111  01/12/2019   HDL 29 (L) 01/12/2019   CHOLHDL 4.2 04/17/2018   VLDL 21 03/25/2009   LDLCALC 146 (H) 01/12/2019   LDLCALC 109 (H) 08/11/2018   Lab Results  Component Value Date  TSH 0.746 11/12/2023    Therapeutic Level Labs: No results found for: LITHIUM No results found for: CBMZ No results found for: VALPROATE  Current Medications: Current Outpatient Medications  Medication Sig Dispense Refill   traZODone  (DESYREL ) 50 MG tablet Take 1 tablet (50 mg total) by mouth at bedtime. 30 tablet 0   Accu-Chek Softclix Lancets lancets by Other route. Use as instructed     albuterol  (PROVENTIL ) (2.5 MG/3ML) 0.083% nebulizer solution Inhale 3 mLs into the lungs every 6 (six) hours as needed for wheezing. 360 mL 1   albuterol  (VENTOLIN  HFA) 108 (90 Base) MCG/ACT inhaler Inhale 2 puffs into the lungs every 4 (four) hours as needed for wheezing or shortness of breath (cough, shortness of breath or wheezing.). 18 g 1   Blood Glucose Monitoring Suppl (BLOOD GLUCOSE MONITOR SYSTEM) w/Device KIT Use as directed in the morning, at noon, and at bedtime. 1 kit 0   Ciclopirox  1 % shampoo Apply to the affected area(s) topically at bedtime. 120 mL 0   clobetasol  ointment (TEMOVATE ) 0.05 % Apply 1 Application topically 2 (two) times daily. 30 g 1   dicyclomine  (BENTYL ) 10 MG capsule Take 1 capsule (10 mg total) by mouth 2 (two) times daily as needed for spasms. (Patient not taking: Reported on 07/20/2024) 60 capsule 1   doxazosin  (CARDURA ) 1 MG tablet Take 1 tablet (1 mg total) by mouth daily. 30 tablet 3   famotidine  (PEPCID ) 20 MG tablet Take 1 tablet (20 mg total) by mouth 2 (two) times daily. (Patient not taking: Reported on 07/20/2024) 180 tablet 3   Fluocinolone  Acetonide Scalp 0.01 % OIL Apply topically.     Fluocinolone  Acetonide Scalp 0.01 % OIL Apply 1 Application topically 2 (two) times a week. 118.28 mL 1   fluticasone  (FLONASE ) 50 MCG/ACT nasal spray Place 2 sprays into both nostrils daily.  16 g 6   fluticasone -salmeterol (WIXELA INHUB ) 100-50 MCG/ACT AEPB Inhale 1 puff into the lungs 2 (two) times daily. 60 each 5   Glucose Blood (BLOOD GLUCOSE TEST STRIPS 333 VI) 100 each by In Vitro route 2 (two) times daily before a meal.     hydrochlorothiazide  (MICROZIDE ) 12.5 MG capsule Take 1 capsule (12.5 mg total) by mouth daily. 90 capsule 1   insulin  glargine (LANTUS  SOLOSTAR) 100 UNIT/ML Solostar Pen Inject 12 Units into the skin daily. 15 mL 3   Insulin  Pen Needle (PEN NEEDLES) 32G X 4 MM MISC Use as directed for injecting insulin  100 each 2   irbesartan -hydrochlorothiazide  (AVALIDE) 300-12.5 MG tablet Take 1 tablet by mouth daily. 90 tablet 1   levalbuterol  (XOPENEX ) 0.63 MG/3ML nebulizer solution Take 3 mLs (0.63 mg total) by nebulization every 6 (six) hours for 8 days. (Patient not taking: Reported on 07/20/2024) 75 mL 0   levofloxacin  (LEVAQUIN ) 500 MG tablet Take 1 tablet (500 mg total) by mouth daily for 5 days. 5 tablet 0   lidocaine  (XYLOCAINE ) 2 % solution Use as directed 15 mLs in the mouth or throat every 4 (four) hours as needed. (Patient not taking: Reported on 07/20/2024) 270 mL 0   metoCLOPramide  (REGLAN ) 10 MG tablet Take 1 tablet (10 mg total) by mouth every 8 (eight) hours as needed for Nausea 30 tablet 1   misoprostol (CYTOTEC) 200 MCG tablet SMARTSIG:1 Tablet(s) By Mouth     Naftifine  HCl 2 % CREA Apply liberally to affected area daily 60 g 1   Naphazoline-Pheniramine (ALLERGY EYE OP) Place 1 drop into both eyes daily as  needed (allergies).     NIFEdipine  (ADALAT  CC) 60 MG 24 hr tablet Take 60 mg by mouth daily.     NIFEdipine  (PROCARDIA  XL/NIFEDICAL XL) 60 MG 24 hr tablet Take 2 tablets (120 mg total) by mouth daily. 60 tablet 0   nitroGLYCERIN  (NITRODUR - DOSED IN MG/24 HR) 0.1 mg/hr patch Place 1 patch (0.1 mg total) onto the skin daily. (Patient not taking: Reported on 07/20/2024) 30 patch 12   norethindrone  (AYGESTIN ) 5 MG tablet Take 3 tablets (15 mg total) by  mouth daily. 180 tablet 5   Oxycodone  HCl 10 MG TABS Take 1 tablet (10 mg total) by mouth every 8 (eight) hours as needed. 75 tablet 0   pantoprazole  (PROTONIX ) 40 MG tablet Take 1 tablet (40 mg total) by mouth daily. 90 tablet 3   predniSONE  (STERAPRED UNI-PAK 21 TAB) 10 MG (21) TBPK tablet Use per package instruction 21 tablet 0   promethazine -dextromethorphan  (PROMETHAZINE -DM) 6.25-15 MG/5ML syrup Take 5 mLs by mouth every 6 (six) hours as needed. (Patient not taking: Reported on 07/20/2024) 180 mL 0   sucralfate  (CARAFATE ) 1 g tablet Take 1 tablet (1 g total) by mouth 4 (four) times daily. 360 tablet 1   tiZANidine  (ZANAFLEX ) 4 MG tablet Take 1 tablet (4 mg total) by mouth every 8 (eight) hours as needed for muscle spasms. 30 tablet 0   triamcinolone  cream (KENALOG ) 0.1 % Apply to the affected area(s) topically 2 (two) times daily. 30 g 0   zolpidem  (AMBIEN ) 5 MG tablet Take 1 tablet (5 mg total) by mouth at bedtime as needed for sleep. 30 tablet 0   No current facility-administered medications for this visit.    Musculoskeletal: Virtual assessment   Psychiatric Specialty Exam: Review of Systems  Psychiatric/Behavioral:  Positive for sleep disturbance. Negative for behavioral problems. The patient is nervous/anxious.   All other systems reviewed and are negative.   There were no vitals taken for this visit.There is no height or weight on file to calculate BMI.  General Appearance: Casual  Eye Contact:  Good  Speech:  Clear and Coherent  Volume:  Normal  Mood:  Depressed  Affect:  Congruent  Thought Process:  Coherent  Orientation:  Full (Time, Place, and Person)  Thought Content:  Logical  Suicidal Thoughts:  No  Homicidal Thoughts:  No  Memory:  Immediate;   Fair  Judgement:  Fair  Insight:  Good  Psychomotor Activity:  NA  Concentration:  Concentration: Fair  Recall:  Molly Archer of Knowledge:Fair  Language: Good  Akathisia:  No  Handed:  Right  AIMS (if indicated):   not done  Assets:  Communication Skills Desire for Improvement  ADL's:  Intact  Cognition: WNL  Sleep:  Poor reported 3 to 4 hours    Screenings: GAD-7    Flowsheet Row Office Visit from 04/02/2024 in [facility] at [facility] Visit from 02/20/2024 in [facility] for [facility] at [facility] Office Visit from 02/06/2024 in [facility] Primary Care & Sports Medicine at [facility] Office Visit from 01/06/2024 in [facility] Primary Care & Sports Medicine at [facility] Office Visit from 10/14/2023 in [facility] Primary Care & Sports Medicine at [facility]  Total GAD-7 Score 0 2 0 0 0   PHQ2-9    Flowsheet Row Office Visit from 07/22/2024 in [facility] Office Visit from 04/02/2024 in [facility] Primary Care & Sports Medicine at  [facility] Office Visit from 02/20/2024 in [facility] for [employer] at [facility] Office Visit from 02/06/2024 in [facility] Primary Care & Sports Medicine at [facility] Office Visit from 01/06/2024 in [facility] Primary Care & Sports Medicine at [facility] Total Score 2 0 1 0 0  PHQ-9 Total Score 10 0 9 0 0   Flowsheet Row ED to Hosp-Admission (Discharged) from 06/29/2024 in [place] [facility]U Admission (Discharged) from 05/06/2024 in [facility] ED to Hosp-Admission (Discharged) from 01/02/2024 in [place] [facility] PCU  C-SSRS RISK CATEGORY No Risk No Risk No Risk    Assessment and Plan: Molly Archer 43 year old African-American female presents to establish care.  Reports she has been diagnosed with depression and anxiety in the past.  Denies she is on any psychotropic medication.  Currently followed by orthopedist and pain management.  Reports her most pressing concern is related to sleep disturbance. Patient is amendable to starting trazodone  25  mg-50 mg PRN and education was provided with discontinuing Ambien  and Zanaflex . Discussed possible respiratory complication. Patient provided verbal understanding and will f/u in 1 week for adherence/ tolerability.   Collaboration of Care: Medication Management AEB Started on Trazodone  50 mg at bedtime PRN.    Patient/Guardian was advised Release of Information must be obtained prior to any record release in order to collaborate their care with an outside provider. Patient/Guardian was advised if they have not already done so to contact the registration department to sign all necessary forms in order for us  to release information regarding their care.   Consent: Patient/Guardian gives verbal consent for treatment and assignment of benefits for services provided during this visit. Patient/Guardian expressed understanding and agreed to proceed.   Molly LOISE Kerns, NP 12/24/20257:39 AM     [1]  Allergies Allergen Reactions   Clindamycin     Codeine Hives   Doxycycline  Nausea Only    Pt states causes nausea and vomiting.   Losartan Other (See Comments)    headache   Penicillins Hives   Tramadol  Hives   "

## 2024-07-24 ENCOUNTER — Other Ambulatory Visit (HOSPITAL_BASED_OUTPATIENT_CLINIC_OR_DEPARTMENT_OTHER): Payer: Self-pay

## 2024-07-24 MED ORDER — PROMETHAZINE-DM 6.25-15 MG/5ML PO SYRP
5.0000 mL | ORAL_SOLUTION | Freq: Four times a day (QID) | ORAL | 0 refills | Status: AC | PRN
  Filled 2024-07-24: qty 180, 9d supply, fill #0

## 2024-07-28 ENCOUNTER — Other Ambulatory Visit (HOSPITAL_BASED_OUTPATIENT_CLINIC_OR_DEPARTMENT_OTHER): Payer: Self-pay

## 2024-07-28 ENCOUNTER — Telehealth (HOSPITAL_COMMUNITY): Admitting: Family

## 2024-07-28 DIAGNOSIS — G479 Sleep disorder, unspecified: Secondary | ICD-10-CM | POA: Diagnosis not present

## 2024-07-28 MED ORDER — DOXEPIN HCL 50 MG PO CAPS
50.0000 mg | ORAL_CAPSULE | Freq: Every day | ORAL | 0 refills | Status: DC
  Filled 2024-07-28: qty 30, 30d supply, fill #0

## 2024-07-28 NOTE — Progress Notes (Signed)
 " Virtual Visit via Video Note  I connected with Molly Archer on 07/28/2024 at  7:00 AM EST by a video enabled telemedicine application and verified that I am speaking with the correct person using two identifiers.  Location: Patient: Home Provider: Home Office   I discussed the limitations of evaluation and management by telemedicine and the availability of in person appointments. The patient expressed understanding and agreed to proceed    I discussed the assessment and treatment plan with the patient. The patient was provided an opportunity to ask questions and all were answered. The patient agreed with the plan and demonstrated an understanding of the instructions.   The patient was advised to call back or seek an in-person evaluation if the symptoms worsen or if the condition fails to improve as anticipated.  I provided 15 minutes of non-face-to-face time during this encounter.   Staci LOISE Kerns, NP   [facility] MD/PA/NP OP Progress Note  07/28/2024 12:34 PM [name]  MRN:  [id number]  Chief Complaint:  Medication Management   HPI: Molly Archer 43 year old African-American female presents for medication management follow-up appointment.  She was seen and evaluated via care agility virtual platform.  Per previous assessment patient was initiated on trazodone  25 mg to 50 mg nightly as needed.  Discussed taper medications accordingly.  Stated she was not able to stay asleep with medication.  States  I felt palpitations my heart was racing but I did not stay asleep.    Discussed, continue trazodone  medications and try 100 mg-200mg  nightly, repeat as needed.  patient declined.  Provided education with medication side effects related to mirtazapine to include weight gain.  Inquired about side effects related to opiate medication causing drowsiness/sleep.   It makes me a little tired will initiate doxepin 50 mg nightly.  Patient inquired about extended release Ambien  CR.   Declined initiating controlled medication.  Reinforced concerns related to opiates and benzodiazepine.   Mischelle to will follow-up 1 week for medication adherence/tolerability.  Discussed discontinuing Ambien , trazodone  and tizanidine .  Consideration for sleep study.  Appeared amendable to plan.  Will continue to monitor symptoms.  Support, encouragement and reassurance was provided    Visit Diagnosis:    ICD-10-CM   1. Sleep disturbance  G47.9       Past Psychiatric History: Major depression, Generalized Anxiety  and Sleep disturbance.  Currently she is prescribed Ambien  and tizanidine  by primary care provider for sleep disturbance.   Past Medical History:  Past Medical History:  Diagnosis Date   Allergy    Anemia    pt states she took iron for this around 2019, and has had no issues since   Arthritis    knees   Asthma    only uses inhaler in fall and spring   Constipation    Diabetes mellitus without complication (HCC)    GERD (gastroesophageal reflux disease)    diet controlled, occ. uses omeprazole    Headache    otc med prn   Heart murmur    History of kidney stones    Hx of Bell's palsy 12/2011   Hypertension    Menometrorrhagia    Nausea and vomiting 04/14/2024   Psoriasis    Shortness of breath dyspnea    with exercise/exertion   Vitamin D  deficiency     Past Surgical History:  Procedure Laterality Date   CHOLECYSTECTOMY N/A 01/01/2024   Procedure: LAPAROSCOPIC CHOLECYSTECTOMY;  Surgeon: Tanda Locus, MD;  Location: [facility];  Service: General;  Laterality: N/A;   COLONOSCOPY N/A 05/06/2024   Procedure: COLONOSCOPY;  Surgeon: Abran Norleen SAILOR, MD;  Location: [facility];  Service: Gastroenterology;  Laterality: N/A;   COMBINED HYSTEROSCOPY DIAGNOSTIC / D&C N/A 07/30/2009   Benign secretory endometrium   CYSTOSCOPY WITH STENT PLACEMENT Left 10/19/2014   Procedure: CYSTOSCOPY WITH STENT PLACEMENT;  Surgeon: Morene LELON Salines, MD;  Location: [facility];  Service: Urology;   Laterality: Left;   DILATION AND CURETTAGE OF UTERUS  10/29/2003   Polyp, mild atypia, simple and complex hyperplasia,   DILATION AND CURETTAGE OF UTERUS  11/28/2003   Simple and complex hyperplasia   DILATION AND CURETTAGE OF UTERUS  07/30/2009   DILATION AND CURETTAGE OF UTERUS N/A 12/14/2014   Procedure: DILATATION AND CURETTAGE;  Surgeon: Winton Felt, MD;  Location: [facility];  Service: Gynecology;  Laterality: N/A;   ESOPHAGOGASTRODUODENOSCOPY N/A 05/06/2024   Procedure: EGD (ESOPHAGOGASTRODUODENOSCOPY);  Surgeon: Abran Norleen SAILOR, MD;  Location: [facility];  Service: Gastroenterology;  Laterality: N/A;   HOLMIUM LASER APPLICATION Left 10/19/2014   Procedure: HOLMIUM LASER APPLICATION;  Surgeon: Morene LELON Salines, MD;  Location: [facility];  Service: Urology;  Laterality: Left;   HYSTEROSCOPY WITH D & C N/A 07/21/2018   Procedure: DILATATION AND CURETTAGE /HYSTEROSCOPY;  Surgeon: Eveline Lynwood MATSU, MD;  Location: [facility];  Service: Gynecology;  Laterality: N/A;   INTRAUTERINE DEVICE (IUD) INSERTION N/A 12/14/2014   Procedure: INTRAUTERINE DEVICE (IUD) INSERTION;  Surgeon: Winton Felt, MD;  Location: [facility];  Service: Gynecology;  Laterality: N/A;   KNEE CARTILAGE SURGERY Left    STONE EXTRACTION WITH BASKET Left 10/19/2014   Procedure: STONE EXTRACTION WITH BASKET;  Surgeon: Morene LELON Salines, MD;  Location: [facility];  Service: Urology;  Laterality: Left;   WISDOM TOOTH EXTRACTION      Family Psychiatric History:   Family History:  Family History  Problem Relation Age of Onset   Hypertension Mother    Hyperlipidemia Mother    Diabetes Mother    Hypertension Father    Diabetes Father    Stroke Maternal Grandmother    Cancer Maternal Grandfather    Hypertension Other    Diabetes Other    Cancer Other    Cancer Maternal Aunt    Diabetes Maternal Aunt    Diabetes Maternal Uncle    Liver disease Neg Hx    Colon cancer Neg Hx    Esophageal cancer Neg Hx     Social History:   Social History   Socioeconomic History   Marital status: Single    Spouse name: Not on file   Number of children: 0   Years of education: Not on file   Highest education level: Bachelor's degree (e.g., BA, AB, BS)  Occupational History   Not on file  Tobacco Use   Smoking status: Never    Passive exposure: Never   Smokeless tobacco: Never   Tobacco comments:    Never Smoked  Vaping Use   Vaping status: Never Used  Substance and Sexual Activity   Alcohol use: Never   Drug use: Never   Sexual activity: Not Currently    Birth control/protection: Abstinence, Pill  Other Topics Concern   Not on file  Social History Narrative   She is from [place], [place], moved here 1998.    Lives at home by herself.    Social Drivers of Health   Tobacco Use: Low Risk (07/20/2024)   Patient History    Smoking Tobacco Use: Never  Smokeless Tobacco Use: Never    Passive Exposure: Never  Financial Resource Strain: Medium Risk (06/28/2024)   Overall Financial Resource Strain (CARDIA)    Difficulty of Paying Living Expenses: Somewhat hard  Food Insecurity: No Food Insecurity (06/29/2024)   Epic    Worried About Programme Researcher, Broadcasting/film/video in the Last Year: Never true    Ran Out of Food in the Last Year: Never true  Recent Concern: Food Insecurity - Food Insecurity Present (06/28/2024)   Epic    Worried About Programme Researcher, Broadcasting/film/video in the Last Year: Sometimes true    [employer] of Food in the Last Year: Sometimes true  Transportation Needs: No Transportation Needs (06/29/2024)   Epic    Lack of Transportation (Medical): No    Lack of Transportation (Non-Medical): No  Physical Activity: Insufficiently Active (06/28/2024)   Exercise Vital Sign    Days of Exercise per Week: 2 days    Minutes of Exercise per Session: 20 min  Stress: Stress Concern Present (06/28/2024)   [employer] of Occupational Health - Occupational Stress Questionnaire    Feeling of Stress: Very much  Social Connections:  Moderately Isolated (06/28/2024)   Social Connection and Isolation Panel    Frequency of Communication with Friends and Family: Twice a week    Frequency of Social Gatherings with Friends and Family: Once a week    Attends Religious Services: 1 to 4 times per year    Active Member of Clubs or Organizations: No    Attends Banker Meetings: Not on file    Marital Status: Never married  Depression (PHQ2-9): Medium Risk (07/22/2024)   Depression (PHQ2-9)    PHQ-2 Score: 10  Alcohol Screen: Low Risk (07/20/2024)   Alcohol Screen    Last Alcohol Screening Score (AUDIT): 0  Housing: Low Risk (06/29/2024)   Epic    Unable to Pay for Housing in the Last Year: No    Number of Times Moved in the Last Year: 0    Homeless in the Last Year: No  Recent Concern: Housing - High Risk (06/28/2024)   Epic    Unable to Pay for Housing in the Last Year: Yes    Number of Times Moved in the Last Year: 0    Homeless in the Last Year: No  Utilities: Not At Risk (06/29/2024)   Epic    Threatened with loss of utilities: No  Health Literacy: Adequate Health Literacy (07/20/2024)   B1300 Health Literacy    Frequency of need for help with medical instructions: Never    Allergies: Allergies[1]  Metabolic Disorder Labs: Lab Results  Component Value Date   HGBA1C 9.8 (A) 04/02/2024   HGBA1C 9.8 04/02/2024   Lab Results  Component Value Date   PROLACTIN 3.9 10/21/2013   Lab Results  Component Value Date   CHOL 197 01/12/2019   TRIG 111 01/12/2019   HDL 29 (L) 01/12/2019   CHOLHDL 4.2 04/17/2018   VLDL 21 03/25/2009   LDLCALC 146 (H) 01/12/2019   LDLCALC 109 (H) 08/11/2018   Lab Results  Component Value Date   TSH 0.746 11/12/2023   TSH 1.200 08/11/2018    Therapeutic Level Labs: No results found for: LITHIUM No results found for: VALPROATE No results found for: CBMZ  Current Medications: Current Outpatient Medications  Medication Sig Dispense Refill   doxepin  (SINEQUAN) 50 MG capsule Take 1 capsule (50 mg total) by mouth at bedtime. 30 capsule 0   Accu-Chek Softclix  Lancets lancets by Other route. Use as instructed     albuterol  (PROVENTIL ) (2.5 MG/3ML) 0.083% nebulizer solution Inhale 3 mLs into the lungs every 6 (six) hours as needed for wheezing. 360 mL 1   albuterol  (VENTOLIN  HFA) 108 (90 Base) MCG/ACT inhaler Inhale 2 puffs into the lungs every 4 (four) hours as needed for wheezing or shortness of breath (cough, shortness of breath or wheezing.). 18 g 1   Blood Glucose Monitoring Suppl (BLOOD GLUCOSE MONITOR SYSTEM) w/Device KIT Use as directed in the morning, at noon, and at bedtime. 1 kit 0   Ciclopirox  1 % shampoo Apply to the affected area(s) topically at bedtime. 120 mL 0   clobetasol  ointment (TEMOVATE ) 0.05 % Apply 1 Application topically 2 (two) times daily. 30 g 1   dicyclomine  (BENTYL ) 10 MG capsule Take 1 capsule (10 mg total) by mouth 2 (two) times daily as needed for spasms. (Patient not taking: Reported on 07/20/2024) 60 capsule 1   doxazosin  (CARDURA ) 1 MG tablet Take 1 tablet (1 mg total) by mouth daily. 30 tablet 3   famotidine  (PEPCID ) 20 MG tablet Take 1 tablet (20 mg total) by mouth 2 (two) times daily. (Patient not taking: Reported on 07/20/2024) 180 tablet 3   Fluocinolone  Acetonide Scalp 0.01 % OIL Apply topically.     Fluocinolone  Acetonide Scalp 0.01 % OIL Apply 1 Application topically 2 (two) times a week. 118.28 mL 1   fluticasone  (FLONASE ) 50 MCG/ACT nasal spray Place 2 sprays into both nostrils daily. 16 g 6   fluticasone -salmeterol (WIXELA INHUB ) 100-50 MCG/ACT AEPB Inhale 1 puff into the lungs 2 (two) times daily. 60 each 5   Glucose Blood (BLOOD GLUCOSE TEST STRIPS 333 VI) 100 each by In Vitro route 2 (two) times daily before a meal.     hydrochlorothiazide  (MICROZIDE ) 12.5 MG capsule Take 1 capsule (12.5 mg total) by mouth daily. 90 capsule 1   insulin  glargine (LANTUS  SOLOSTAR) 100 UNIT/ML Solostar Pen Inject 12  Units into the skin daily. 15 mL 3   Insulin  Pen Needle (PEN NEEDLES) 32G X 4 MM MISC Use as directed for injecting insulin  100 each 2   irbesartan -hydrochlorothiazide  (AVALIDE) 300-12.5 MG tablet Take 1 tablet by mouth daily. 90 tablet 1   levalbuterol  (XOPENEX ) 0.63 MG/3ML nebulizer solution Take 3 mLs (0.63 mg total) by nebulization every 6 (six) hours for 8 days. (Patient not taking: Reported on 07/20/2024) 75 mL 0   lidocaine  (XYLOCAINE ) 2 % solution Use as directed 15 mLs in the mouth or throat every 4 (four) hours as needed. (Patient not taking: Reported on 07/20/2024) 270 mL 0   metoCLOPramide  (REGLAN ) 10 MG tablet Take 1 tablet (10 mg total) by mouth every 8 (eight) hours as needed for Nausea 30 tablet 1   misoprostol (CYTOTEC) 200 MCG tablet SMARTSIG:1 Tablet(s) By Mouth     Naftifine  HCl 2 % CREA Apply liberally to affected area daily 60 g 1   Naphazoline-Pheniramine (ALLERGY EYE OP) Place 1 drop into both eyes daily as needed (allergies).     NIFEdipine  (ADALAT  CC) 60 MG 24 hr tablet Take 60 mg by mouth daily.     NIFEdipine  (PROCARDIA  XL/NIFEDICAL XL) 60 MG 24 hr tablet Take 2 tablets (120 mg total) by mouth daily. 60 tablet 0   nitroGLYCERIN  (NITRODUR - DOSED IN MG/24 HR) 0.1 mg/hr patch Place 1 patch (0.1 mg total) onto the skin daily. (Patient not taking: Reported on 07/20/2024) 30 patch 12   norethindrone  (AYGESTIN ) 5  MG tablet Take 3 tablets (15 mg total) by mouth daily. 180 tablet 5   Oxycodone  HCl 10 MG TABS Take 1 tablet (10 mg total) by mouth every 8 (eight) hours as needed. 75 tablet 0   pantoprazole  (PROTONIX ) 40 MG tablet Take 1 tablet (40 mg total) by mouth daily. 90 tablet 3   predniSONE  (STERAPRED UNI-PAK 21 TAB) 10 MG (21) TBPK tablet Use per package instruction 21 tablet 0   promethazine -dextromethorphan  (PROMETHAZINE -DM) 6.25-15 MG/5ML syrup Take 5 mLs by mouth every 6 (six) hours as needed. 180 mL 0   sucralfate  (CARAFATE ) 1 g tablet Take 1 tablet (1 g total) by mouth  4 (four) times daily. 360 tablet 1   tiZANidine  (ZANAFLEX ) 4 MG tablet Take 1 tablet (4 mg total) by mouth every 8 (eight) hours as needed for muscle spasms. 30 tablet 0   triamcinolone  cream (KENALOG ) 0.1 % Apply to the affected area(s) topically 2 (two) times daily. 30 g 0   zolpidem  (AMBIEN ) 5 MG tablet Take 1 tablet (5 mg total) by mouth at bedtime as needed for sleep. 30 tablet 0   No current facility-administered medications for this visit.     Musculoskeletal: Virtual visit  Psychiatric Specialty Exam: Review of Systems  There were no vitals taken for this visit.There is no height or weight on file to calculate BMI.  General Appearance: Casual  Eye Contact:  Good  Speech:  Clear and Coherent  Volume:  Normal  Mood:  Euthymic  Affect:  Congruent  Thought Process:  Coherent  Orientation:  Full (Time, Place, and Person)  Thought Content: Logical   Suicidal Thoughts:  No  Homicidal Thoughts:  No  Memory:  Immediate;   Fair Recent;   Fair  Judgement:  Good  Insight:  Good  Psychomotor Activity:  Normal  Concentration:  Concentration: Good  Recall:  Good  Fund of Knowledge: Good  Language: Good  Akathisia:  No  Handed:  Right  AIMS (if indicated): not done  Assets:  Communication Skills Desire for Improvement  ADL's:  Intact  Cognition: WNL  Sleep:  Poor   Screenings: GAD-7    Flowsheet Row Office Visit from 04/02/2024 in [facility] Primary Care & Sports Medicine at [facility] Visit from 02/20/2024 in [facility] for [facility] Healthcare at [facility] Office Visit from 02/06/2024 in [facility] Primary Care & Sports Medicine at [facility] Office Visit from 01/06/2024 in [facility] Primary Care & Sports Medicine at [facility] Office Visit from 10/14/2023 in [facility] Primary Care & Sports Medicine at [facility]  Total GAD-7 Score 0 2 0 0 0   PHQ2-9    Flowsheet Row [facility] Office Visit from 04/02/2024 in [facility] Primary Care & Sports Medicine at [facility] Office Visit from 02/20/2024 in [facility] for [facility] Healthcare at [facility] Office Visit from 02/06/2024 in [facility] Primary Care & Sports Medicine at [facility] Office Visit from 01/06/2024 in [facility] Primary Care & Sports Medicine at [facility] Total Score 2 0 1 0 0  PHQ-9 Total Score 10 0 9 0 0   Flowsheet Row ED to Hosp-Admission (Discharged) from 06/29/2024 in [place] [facility] Admission (Discharged) from 05/06/2024 in [facility] ENDOSCOPY ED to Hosp-Admission (Discharged) from 01/02/2024 in [place] [facility]  C-SSRS RISK CATEGORY No Risk No Risk No Risk  Assessment and Plan: Alese Furniss 43 year old female who presents for medication management follow-up appointment.  Seen and evaluated via virtual platform.  Per previous assessment patient was initiated on trazodone  to help with sleep disturbance.  States she has tried medication without symptom relief.  Discussed tapering trazodone  to 100 mg with 1 repeat patient declined citing  heart palpitations discussed initiating doxepin 50 mg nightly and consideration for sleep study consult.  Will follow-up 1 week for medication adherence/tolerability support, encouragement and reassurance was provided.  Collaboration of Care: Collaboration of Care: Medication Management AEB start doxepin 50 mg nightly  Patient/Guardian was advised Release of Information must be obtained prior to any record release in order to collaborate their care with an outside provider. Patient/Guardian was advised if they have not already done so to contact the registration department to sign all necessary forms in order for us  to release information regarding their care.   Consent: Patient/Guardian gives verbal consent for treatment and assignment of benefits  for services provided during this visit. Patient/Guardian expressed understanding and agreed to proceed.    Staci LOISE Kerns, NP 07/28/2024, 12:34 PM      [1]  Allergies Allergen Reactions   Clindamycin     Codeine Hives   Doxycycline  Nausea Only    Pt states causes nausea and vomiting.   Losartan Other (See Comments)    headache   Penicillins Hives   Tramadol  Hives   "

## 2024-07-29 ENCOUNTER — Telehealth (HOSPITAL_BASED_OUTPATIENT_CLINIC_OR_DEPARTMENT_OTHER): Payer: Self-pay | Admitting: Pharmacy Technician

## 2024-07-29 NOTE — Telephone Encounter (Signed)
 Pharmacy Patient Advocate Encounter   Received notification from Onbase that prior authorization for Mounjaro  2.5MG /0.5ML auto-injectors is due for renewal.   Insurance verification completed.   The patient is insured through [employer].  Action: Medication has been discontinued. Archived Key: [id number]

## 2024-08-05 ENCOUNTER — Telehealth (HOSPITAL_BASED_OUTPATIENT_CLINIC_OR_DEPARTMENT_OTHER): Admitting: Family

## 2024-08-05 DIAGNOSIS — G479 Sleep disorder, unspecified: Secondary | ICD-10-CM | POA: Diagnosis not present

## 2024-08-05 NOTE — Progress Notes (Signed)
 " Virtual Visit via Video Note  I connected with Molly Archer on 08/05/2024 at  7:00 AM EST by a video enabled telemedicine application and verified that I am speaking with the correct person using two identifiers.  Location: Patient: Molly Archer  Provider: Home Office   I discussed the limitations of evaluation and management by telemedicine and the availability of in person appointments. The patient expressed understanding and agreed to proceed.  I discussed the assessment and treatment plan with the patient. The patient was provided an opportunity to ask questions and all were answered. The patient agreed with the plan and demonstrated an understanding of the instructions.   The patient was advised to call back or seek an in-person evaluation if the symptoms worsen or if the condition fails to improve as anticipated.  I provided 10 minutes of non-face-to-face time during this encounter.   Staci LOISE Kerns, NP   [facility] MD/PA/NP OP Progress Note  08/05/2024 10:04 AM Molly LALLEY  MRN:  [id number]  Chief Complaint:  [name] stated  I can't tell if the medication is helping, I still waking up around 2 am and its taken a while to go to sleep.  HPI: Molly Archer is a 44 year old African-American female who was seen and evaluated via virtual platform via [employer].  Patient presented for medication management follow-up appointment.  Per previous assessment patient was initiated on trazodone  25 mg- 50 mg to which she reports she has been unable to tolerate.  Offered to titrate trazodone  50 mg to 100 mg, declined citing  heart palpitations and was unable to stay asleep.  Discussed initiating doxepin  50 mg nightly.  Stated  I can't tell if the medicaiton is working and was up around 2 a.m. and it still taking me a while to fall asleep.  Discussed sleeping hygiene and consideration for sleep study.  Patient reports following up with her primary care as she is interested in Ambien  extended  release.   Trazodone  discontinued, patient reported doxepin  is ineffective.  Patient continues to inquire about Ambien .  To which she initially reported that Ambien  was ineffective.  This provider discussed concerns related to long-term Ambien  use coupled with Zanaflex  and oxycodone  (opiate).  Orders placed for sleep study. Discussed additional sleep medications to include Unisom, mirtazapine, Rozerem and or  low-dose Seroquel. Patient reports she will follow-up with primary care.  Follow-up as needed.    Visit Diagnosis:    ICD-10-CM   1. Sleep disturbance  G47.9 Ambulatory referral to Sleep Studies      Past Psychiatric History: H/O: Major depression, Generalized Anxiety  and Sleep disturbance.  Currently she is prescribed Ambien  and tizanidine  by primary care provider for sleep disturbance   Past Medical History:  Past Medical History:  Diagnosis Date   Allergy    Anemia    pt states she took iron for this around 2019, and has had no issues since   Arthritis    knees   Asthma    only uses inhaler in fall and spring   Constipation    Diabetes mellitus without complication (HCC)    GERD (gastroesophageal reflux disease)    diet controlled, occ. uses omeprazole    Headache    otc med prn   Heart murmur    History of kidney stones    Hx of Bell's palsy 12/2011   Hypertension    Menometrorrhagia    Nausea and vomiting 04/14/2024   Psoriasis    Shortness of breath dyspnea  with exercise/exertion   Vitamin D  deficiency     Past Surgical History:  Procedure Laterality Date   CHOLECYSTECTOMY N/A 01/01/2024   Procedure: LAPAROSCOPIC CHOLECYSTECTOMY;  Surgeon: Tanda Locus, MD;  Location: [facility] OR;  Service: General;  Laterality: N/A;   COLONOSCOPY N/A 05/06/2024   Procedure: COLONOSCOPY;  Surgeon: Abran Norleen SAILOR, MD;  Location: [facility];  Service: Gastroenterology;  Laterality: N/A;   COMBINED HYSTEROSCOPY DIAGNOSTIC / D&C N/A 07/30/2009   Benign secretory endometrium    CYSTOSCOPY WITH STENT PLACEMENT Left 10/19/2014   Procedure: CYSTOSCOPY WITH STENT PLACEMENT;  Surgeon: Morene LELON Salines, MD;  Location: [facility];  Service: Urology;  Laterality: Left;   DILATION AND CURETTAGE OF UTERUS  10/29/2003   Polyp, mild atypia, simple and complex hyperplasia,   DILATION AND CURETTAGE OF UTERUS  11/28/2003   Simple and complex hyperplasia   DILATION AND CURETTAGE OF UTERUS  07/30/2009   DILATION AND CURETTAGE OF UTERUS N/A 12/14/2014   Procedure: DILATATION AND CURETTAGE;  Surgeon: Winton Felt, MD;  Location: [facility];  Service: Gynecology;  Laterality: N/A;   ESOPHAGOGASTRODUODENOSCOPY N/A 05/06/2024   Procedure: EGD (ESOPHAGOGASTRODUODENOSCOPY);  Surgeon: Abran Norleen SAILOR, MD;  Location: [facility];  Service: Gastroenterology;  Laterality: N/A;   HOLMIUM LASER APPLICATION Left 10/19/2014   Procedure: HOLMIUM LASER APPLICATION;  Surgeon: Morene LELON Salines, MD;  Location: [facility];  Service: Urology;  Laterality: Left;   HYSTEROSCOPY WITH D & C N/A 07/21/2018   Procedure: DILATATION AND CURETTAGE /HYSTEROSCOPY;  Surgeon: Eveline Lynwood MATSU, MD;  Location: [facility];  Service: Gynecology;  Laterality: N/A;   INTRAUTERINE DEVICE (IUD) INSERTION N/A 12/14/2014   Procedure: INTRAUTERINE DEVICE (IUD) INSERTION;  Surgeon: Winton Felt, MD;  Location: [facility];  Service: Gynecology;  Laterality: N/A;   KNEE CARTILAGE SURGERY Left    STONE EXTRACTION WITH BASKET Left 10/19/2014   Procedure: STONE EXTRACTION WITH BASKET;  Surgeon: Morene LELON Salines, MD;  Location: [facility];  Service: Urology;  Laterality: Left;   WISDOM TOOTH EXTRACTION      Family Psychiatric History:   Family History:  Family History  Problem Relation Age of Onset   Hypertension Mother    Hyperlipidemia Mother    Diabetes Mother    Hypertension Father    Diabetes Father    Stroke Maternal Grandmother    Cancer Maternal Grandfather    Hypertension Other    Diabetes Other    Cancer Other    Cancer  Maternal Aunt    Diabetes Maternal Aunt    Diabetes Maternal Uncle    Liver disease Neg Hx    Colon cancer Neg Hx    Esophageal cancer Neg Hx     Social History:  Social History   Socioeconomic History   Marital status: Single    Spouse name: Not on file   Number of children: 0   Years of education: Not on file   Highest education level: Bachelor's degree (e.g., BA, AB, BS)  Occupational History   Not on file  Tobacco Use   Smoking status: Never    Passive exposure: Never   Smokeless tobacco: Never   Tobacco comments:    Never Smoked  Vaping Use   Vaping status: Never Used  Substance and Sexual Activity   Alcohol use: Never   Drug use: Never   Sexual activity: Not Currently    Birth control/protection: Abstinence, Pill  Other Topics Concern   Not on file  Social History Narrative   She is from  [place], [place], moved here 1998.    Lives at home by herself.    Social Drivers of Health   Tobacco Use: Low Risk (07/20/2024)   Patient History    Smoking Tobacco Use: Never    Smokeless Tobacco Use: Never    Passive Exposure: Never  Financial Resource Strain: Medium Risk (06/28/2024)   Overall Financial Resource Strain (CARDIA)    Difficulty of Paying Living Expenses: Somewhat hard  Food Insecurity: No Food Insecurity (06/29/2024)   Epic    Worried About Programme Researcher, Broadcasting/film/video in the Last Year: Never true    Ran Out of Food in the Last Year: Never true  Recent Concern: Food Insecurity - Food Insecurity Present (06/28/2024)   Epic    Worried About Programme Researcher, Broadcasting/film/video in the Last Year: Sometimes true    [employer] of Food in the Last Year: Sometimes true  Transportation Needs: No Transportation Needs (06/29/2024)   Epic    Lack of Transportation (Medical): No    Lack of Transportation (Non-Medical): No  Physical Activity: Insufficiently Active (06/28/2024)   Exercise Vital Sign    Days of Exercise per Week: 2 days    Minutes of Exercise per Session: 20 min  Stress: Stress  Concern Present (06/28/2024)   [employer] of Occupational Health - Occupational Stress Questionnaire    Feeling of Stress: Very much  Social Connections: Moderately Isolated (06/28/2024)   Social Connection and Isolation Panel    Frequency of Communication with Friends and Family: Twice a week    Frequency of Social Gatherings with Friends and Family: Once a week    Attends Religious Services: 1 to 4 times per year    Active Member of Clubs or Organizations: No    Attends Banker Meetings: Not on file    Marital Status: Never married  Depression (PHQ2-9): Medium Risk (07/22/2024)   Depression (PHQ2-9)    PHQ-2 Score: 10  Alcohol Screen: Low Risk (07/20/2024)   Alcohol Screen    Last Alcohol Screening Score (AUDIT): 0  Housing: Low Risk (06/29/2024)   Epic    Unable to Pay for Housing in the Last Year: No    Number of Times Moved in the Last Year: 0    Homeless in the Last Year: No  Recent Concern: Housing - High Risk (06/28/2024)   Epic    Unable to Pay for Housing in the Last Year: Yes    Number of Times Moved in the Last Year: 0    Homeless in the Last Year: No  Utilities: Not At Risk (06/29/2024)   Epic    Threatened with loss of utilities: No  Health Literacy: Adequate Health Literacy (07/20/2024)   B1300 Health Literacy    Frequency of need for help with medical instructions: Never    Allergies: Allergies[1]  Metabolic Disorder Labs: Lab Results  Component Value Date   HGBA1C 9.8 (A) 04/02/2024   HGBA1C 9.8 04/02/2024   Lab Results  Component Value Date   PROLACTIN 3.9 10/21/2013   Lab Results  Component Value Date   CHOL 197 01/12/2019   TRIG 111 01/12/2019   HDL 29 (L) 01/12/2019   CHOLHDL 4.2 04/17/2018   VLDL 21 03/25/2009   LDLCALC 146 (H) 01/12/2019   LDLCALC 109 (H) 08/11/2018   Lab Results  Component Value Date   TSH 0.746 11/12/2023   TSH 1.200 08/11/2018    Therapeutic Level Labs: No results found for: LITHIUM No  results found for:  VALPROATE No results found for: CBMZ  Current Medications: Current Outpatient Medications  Medication Sig Dispense Refill   Accu-Chek Softclix Lancets lancets by Other route. Use as instructed     albuterol  (PROVENTIL ) (2.5 MG/3ML) 0.083% nebulizer solution Inhale 3 mLs into the lungs every 6 (six) hours as needed for wheezing. 360 mL 1   albuterol  (VENTOLIN  HFA) 108 (90 Base) MCG/ACT inhaler Inhale 2 puffs into the lungs every 4 (four) hours as needed for wheezing or shortness of breath (cough, shortness of breath or wheezing.). 18 g 1   Blood Glucose Monitoring Suppl (BLOOD GLUCOSE MONITOR SYSTEM) w/Device KIT Use as directed in the morning, at noon, and at bedtime. 1 kit 0   Ciclopirox  1 % shampoo Apply to the affected area(s) topically at bedtime. 120 mL 0   clobetasol  ointment (TEMOVATE ) 0.05 % Apply 1 Application topically 2 (two) times daily. 30 g 1   dicyclomine  (BENTYL ) 10 MG capsule Take 1 capsule (10 mg total) by mouth 2 (two) times daily as needed for spasms. (Patient not taking: Reported on 07/20/2024) 60 capsule 1   doxazosin  (CARDURA ) 1 MG tablet Take 1 tablet (1 mg total) by mouth daily. 30 tablet 3   famotidine  (PEPCID ) 20 MG tablet Take 1 tablet (20 mg total) by mouth 2 (two) times daily. (Patient not taking: Reported on 07/20/2024) 180 tablet 3   Fluocinolone  Acetonide Scalp 0.01 % OIL Apply topically.     Fluocinolone  Acetonide Scalp 0.01 % OIL Apply 1 Application topically 2 (two) times a week. 118.28 mL 1   fluticasone  (FLONASE ) 50 MCG/ACT nasal spray Place 2 sprays into both nostrils daily. 16 g 6   fluticasone -salmeterol (WIXELA INHUB ) 100-50 MCG/ACT AEPB Inhale 1 puff into the lungs 2 (two) times daily. 60 each 5   Glucose Blood (BLOOD GLUCOSE TEST STRIPS 333 VI) 100 each by In Vitro route 2 (two) times daily before a meal.     hydrochlorothiazide  (MICROZIDE ) 12.5 MG capsule Take 1 capsule (12.5 mg total) by mouth daily. 90 capsule 1   insulin   glargine (LANTUS  SOLOSTAR) 100 UNIT/ML Solostar Pen Inject 12 Units into the skin daily. 15 mL 3   Insulin  Pen Needle (PEN NEEDLES) 32G X 4 MM MISC Use as directed for injecting insulin  100 each 2   irbesartan -hydrochlorothiazide  (AVALIDE) 300-12.5 MG tablet Take 1 tablet by mouth daily. 90 tablet 1   levalbuterol  (XOPENEX ) 0.63 MG/3ML nebulizer solution Take 3 mLs (0.63 mg total) by nebulization every 6 (six) hours for 8 days. (Patient not taking: Reported on 07/20/2024) 75 mL 0   lidocaine  (XYLOCAINE ) 2 % solution Use as directed 15 mLs in the mouth or throat every 4 (four) hours as needed. (Patient not taking: Reported on 07/20/2024) 270 mL 0   metoCLOPramide  (REGLAN ) 10 MG tablet Take 1 tablet (10 mg total) by mouth every 8 (eight) hours as needed for Nausea 30 tablet 1   misoprostol (CYTOTEC) 200 MCG tablet SMARTSIG:1 Tablet(s) By Mouth     Naftifine  HCl 2 % CREA Apply liberally to affected area daily 60 g 1   Naphazoline-Pheniramine (ALLERGY EYE OP) Place 1 drop into both eyes daily as needed (allergies).     NIFEdipine  (ADALAT  CC) 60 MG 24 hr tablet Take 60 mg by mouth daily.     NIFEdipine  (PROCARDIA  XL/NIFEDICAL XL) 60 MG 24 hr tablet Take 2 tablets (120 mg total) by mouth daily. 60 tablet 0   nitroGLYCERIN  (NITRODUR - DOSED IN MG/24 HR) 0.1 mg/hr patch Place 1 patch (  0.1 mg total) onto the skin daily. (Patient not taking: Reported on 07/20/2024) 30 patch 12   norethindrone  (AYGESTIN ) 5 MG tablet Take 3 tablets (15 mg total) by mouth daily. 180 tablet 5   Oxycodone  HCl 10 MG TABS Take 1 tablet (10 mg total) by mouth every 8 (eight) hours as needed. 75 tablet 0   pantoprazole  (PROTONIX ) 40 MG tablet Take 1 tablet (40 mg total) by mouth daily. 90 tablet 3   predniSONE  (STERAPRED UNI-PAK 21 TAB) 10 MG (21) TBPK tablet Use per package instruction 21 tablet 0   promethazine -dextromethorphan  (PROMETHAZINE -DM) 6.25-15 MG/5ML syrup Take 5 mLs by mouth every 6 (six) hours as needed. 180 mL 0    sucralfate  (CARAFATE ) 1 g tablet Take 1 tablet (1 g total) by mouth 4 (four) times daily. 360 tablet 1   tiZANidine  (ZANAFLEX ) 4 MG tablet Take 1 tablet (4 mg total) by mouth every 8 (eight) hours as needed for muscle spasms. 30 tablet 0   triamcinolone  cream (KENALOG ) 0.1 % Apply to the affected area(s) topically 2 (two) times daily. 30 g 0   zolpidem  (AMBIEN ) 5 MG tablet Take 1 tablet (5 mg total) by mouth at bedtime as needed for sleep. 30 tablet 0   No current facility-administered medications for this visit.     Musculoskeletal: Virtual assessment  Psychiatric Specialty Exam: Review of Systems  There were no vitals taken for this visit.There is no height or weight on file to calculate BMI.  General Appearance: Casual  Eye Contact:  Good  Speech:  Clear and Coherent  Volume:  Normal  Mood:  Euthymic  Affect:  Congruent  Thought Process:  Coherent  Orientation:  Full (Time, Place, and Person)  Thought Content: Logical   Suicidal Thoughts:  No  Homicidal Thoughts:  No  Memory:  Immediate;   Good Recent;   Good  Judgement:  Good  Insight:  Good  Psychomotor Activity:  NA  Concentration:  Concentration: Good  Recall:  Good  Fund of Knowledge: Good  Language: Fair  Akathisia:  No  Handed:  Right  AIMS (if indicated): not done  Assets:  Communication Skills Desire for Improvement  ADL's:  Intact  Cognition: WNL  Sleep:  Poor   Screenings: GAD-7    Flowsheet Row Office Visit from 04/02/2024 in [facility] Primary Care & Sports Medicine at [facility] Office Visit from 02/20/2024 in [facility] for [facility] Healthcare at [facility] Office Visit from 02/06/2024 in [facility] Primary Care & Sports Medicine at [facility] Office Visit from 01/06/2024 in [facility] Primary Care & Sports Medicine at [facility] Office Visit from 10/14/2023 in [facility] Primary Care & Sports Medicine at [facility]  Total GAD-7 Score 0 2 0 0 0   PHQ2-9     Flowsheet Row Office Visit from 07/22/2024 in [facility] Office Visit from 04/02/2024 in [facility] Primary Care & Sports Medicine at [facility] Office Visit from 02/20/2024 in [facility] for [facility] Healthcare at [facility] Office Visit from 02/06/2024 in [facility] Primary Care & Sports Medicine at [facility] Office Visit from 01/06/2024 in [facility] Primary Care & Sports Medicine at [facility] Total Score 2 0 1 0 0  PHQ-9 Total Score 10 0 9 0 0   Flowsheet Row ED to Hosp-Admission (Discharged) from 06/29/2024 in [place] [facility] PCU Admission (Discharged) from 05/06/2024 in [facility] ENDOSCOPY ED to  Hosp-Admission (Discharged) from 01/02/2024 in [place] [facility] PCU  C-SSRS RISK CATEGORY No Risk No Risk No Risk     Assessment and Plan: Cheryn Lundquist 44 year old African-American female presents for medication management related to sleep disturbance.  Patient recently establish care 1 month ago, Alcie has tried and failed trazodone  and doxepin .  Patient inquired about Ambien  CR.  Discussed different medication options related to sleep disturbance and ultimately following up with sleep study.  Referral was placed.    Hoang states she will follow-up with her primary care to discuss Ambien  options. Discussed additional sleep medications to include Unisom, mirtazapine, Rozerem and or  low-dose Seroquel. Patient reports she will follow-up with primary care.  Follow-up as needed.   -This provider contacted MD de[place] via secure chat per patent' request on 08/05/2024  Collaboration of Care: Collaboration of Care: Medication Management AEB orders placed for sleep study  Patient/Guardian was advised Release of Information must be obtained prior to any record release in order to collaborate their care with an outside provider. Patient/Guardian was advised if they have not  already done so to contact the registration department to sign all necessary forms in order for us  to release information regarding their care.   Consent: Patient/Guardian gives verbal consent for treatment and assignment of benefits for services provided during this visit. Patient/Guardian expressed understanding and agreed to proceed.    Staci LOISE Kerns, NP 08/05/2024, 10:04 AM      [1]  Allergies Allergen Reactions   Clindamycin     Codeine Hives   Doxycycline  Nausea Only    Pt states causes nausea and vomiting.   Losartan Other (See Comments)    headache   Penicillins Hives   Tramadol  Hives   "

## 2024-08-16 ENCOUNTER — Other Ambulatory Visit: Payer: Self-pay | Admitting: Physical Medicine & Rehabilitation

## 2024-08-16 ENCOUNTER — Other Ambulatory Visit (HOSPITAL_BASED_OUTPATIENT_CLINIC_OR_DEPARTMENT_OTHER): Payer: Self-pay

## 2024-08-16 ENCOUNTER — Other Ambulatory Visit (HOSPITAL_BASED_OUTPATIENT_CLINIC_OR_DEPARTMENT_OTHER): Payer: Self-pay | Admitting: Family Medicine

## 2024-08-16 DIAGNOSIS — M1711 Unilateral primary osteoarthritis, right knee: Secondary | ICD-10-CM

## 2024-08-16 DIAGNOSIS — I10 Essential (primary) hypertension: Secondary | ICD-10-CM

## 2024-08-16 DIAGNOSIS — M47816 Spondylosis without myelopathy or radiculopathy, lumbar region: Secondary | ICD-10-CM

## 2024-08-17 ENCOUNTER — Encounter (INDEPENDENT_AMBULATORY_CARE_PROVIDER_SITE_OTHER): Payer: Self-pay | Admitting: Physician Assistant

## 2024-08-17 ENCOUNTER — Ambulatory Visit (INDEPENDENT_AMBULATORY_CARE_PROVIDER_SITE_OTHER): Admitting: Physician Assistant

## 2024-08-17 ENCOUNTER — Other Ambulatory Visit (HOSPITAL_BASED_OUTPATIENT_CLINIC_OR_DEPARTMENT_OTHER): Payer: Self-pay

## 2024-08-17 VITALS — BP 150/74 | HR 82 | Ht 65.0 in | Wt 324.0 lb

## 2024-08-17 DIAGNOSIS — J069 Acute upper respiratory infection, unspecified: Secondary | ICD-10-CM | POA: Diagnosis not present

## 2024-08-17 DIAGNOSIS — J302 Other seasonal allergic rhinitis: Secondary | ICD-10-CM | POA: Diagnosis not present

## 2024-08-17 MED ORDER — NIFEDIPINE ER OSMOTIC RELEASE 60 MG PO TB24
120.0000 mg | ORAL_TABLET | Freq: Every day | ORAL | 0 refills | Status: AC
  Filled 2024-08-17: qty 60, 30d supply, fill #0

## 2024-08-17 MED ORDER — FLUTICASONE PROPIONATE 50 MCG/ACT NA SUSP
2.0000 | Freq: Every day | NASAL | 6 refills | Status: AC
  Filled 2024-08-17: qty 16, 30d supply, fill #0

## 2024-08-17 MED ORDER — LORATADINE 10 MG PO TABS
10.0000 mg | ORAL_TABLET | Freq: Every day | ORAL | 11 refills | Status: AC
  Filled 2024-08-17: qty 30, 30d supply, fill #0

## 2024-08-17 NOTE — Progress Notes (Unsigned)
Patient did not take BP medication this morning. 

## 2024-08-18 ENCOUNTER — Other Ambulatory Visit (HOSPITAL_BASED_OUTPATIENT_CLINIC_OR_DEPARTMENT_OTHER): Payer: Self-pay

## 2024-08-18 ENCOUNTER — Other Ambulatory Visit: Payer: Self-pay

## 2024-08-18 ENCOUNTER — Ambulatory Visit (HOSPITAL_BASED_OUTPATIENT_CLINIC_OR_DEPARTMENT_OTHER): Payer: Self-pay | Admitting: Pulmonary Disease

## 2024-08-18 ENCOUNTER — Encounter: Payer: Self-pay | Admitting: *Deleted

## 2024-08-18 ENCOUNTER — Ambulatory Visit

## 2024-08-18 VITALS — BP 134/78 | HR 87 | Ht 65.0 in | Wt 321.8 lb

## 2024-08-18 DIAGNOSIS — J4521 Mild intermittent asthma with (acute) exacerbation: Secondary | ICD-10-CM

## 2024-08-18 DIAGNOSIS — R0982 Postnasal drip: Secondary | ICD-10-CM | POA: Diagnosis not present

## 2024-08-18 DIAGNOSIS — J310 Chronic rhinitis: Secondary | ICD-10-CM

## 2024-08-18 MED ORDER — IPRATROPIUM BROMIDE 0.06 % NA SOLN
2.0000 | Freq: Four times a day (QID) | NASAL | 12 refills | Status: AC
  Filled 2024-08-18: qty 15, 19d supply, fill #0

## 2024-08-18 MED ORDER — TRIAMCINOLONE ACETONIDE 0.1 % EX CREA
1.0000 | TOPICAL_CREAM | Freq: Two times a day (BID) | CUTANEOUS | 0 refills | Status: AC
  Filled 2024-08-18: qty 15, 30d supply, fill #0
  Filled 2024-08-19: qty 30, 60d supply, fill #0

## 2024-08-18 MED ORDER — TIZANIDINE HCL 4 MG PO TABS
4.0000 mg | ORAL_TABLET | Freq: Three times a day (TID) | ORAL | 0 refills | Status: AC | PRN
  Filled 2024-08-18: qty 30, 10d supply, fill #0

## 2024-08-18 MED ORDER — ZOLPIDEM TARTRATE 5 MG PO TABS
5.0000 mg | ORAL_TABLET | Freq: Every evening | ORAL | 0 refills | Status: AC | PRN
  Filled 2024-08-18: qty 30, 30d supply, fill #0

## 2024-08-18 MED ORDER — OXYCODONE HCL 10 MG PO TABS
10.0000 mg | ORAL_TABLET | Freq: Three times a day (TID) | ORAL | 0 refills | Status: AC | PRN
  Filled 2024-08-18: qty 75, 25d supply, fill #0

## 2024-08-18 MED ORDER — CLOBETASOL PROPIONATE 0.05 % EX OINT
1.0000 | TOPICAL_OINTMENT | Freq: Two times a day (BID) | CUTANEOUS | 0 refills | Status: AC
  Filled 2024-08-18: qty 15, 30d supply, fill #0

## 2024-08-18 MED ORDER — FLUTICASONE-SALMETEROL 250-50 MCG/ACT IN AEPB
1.0000 | INHALATION_SPRAY | Freq: Two times a day (BID) | RESPIRATORY_TRACT | 5 refills | Status: AC
  Filled 2024-08-18: qty 60, 30d supply, fill #0

## 2024-08-18 MED ORDER — PREDNISONE 20 MG PO TABS
ORAL_TABLET | ORAL | 0 refills | Status: AC
  Filled 2024-08-18: qty 21, 14d supply, fill #0

## 2024-08-18 MED ORDER — NAFTIFINE HCL 2 % EX CREA
TOPICAL_CREAM | CUTANEOUS | 0 refills | Status: AC
  Filled 2024-08-18: qty 60, 90d supply, fill #0

## 2024-08-18 NOTE — Patient Instructions (Signed)
" °  VISIT SUMMARY: During your visit, we discussed your persistent cough and congestion, which have been affecting you despite your current asthma and allergy treatments. We reviewed your recent hospital admission and the treatments you have been using since then. We also addressed your sinus congestion, postnasal drip, and the impact of your work environment on your symptoms.  YOUR PLAN: -MILD INTERMITTENT ASTHMA WITH ACUTE EXACERBATION: Asthma is a condition where your airways narrow and swell, making it difficult to breathe. Your recent exacerbation is likely due to postnasal drip and environmental factors. We have increased your Wixela dosage to 250 mcg, two puffs twice daily, and prescribed a prednisone  taper. Use your rescue albuterol  and nebulizer as needed, and use the nebulizer two to three times daily during exacerbations. Please ensure you use your daily maintenance inhaler correctly and call us  if you do not see improvement in 2-3 days.  -CHRONIC RHINITIS WITH POSTNASAL DRIP: Chronic rhinitis is long-term inflammation of the nasal passages, which can cause a runny nose and postnasal drip. This condition is contributing to your asthma exacerbation. We have prescribed Atrovent  nasal spray to reduce postnasal drip and advised you to continue using Flonase  as needed. Be aware of the potential side effects of Atrovent , including the risk of nosebleeds if overused.  INSTRUCTIONS: Please follow the updated medication plan and call us  if you do not see improvement in 2-3 days. Continue using your daily maintenance inhaler correctly and monitor for any side effects from the new medications.    Contains text generated by [employer].   "

## 2024-08-18 NOTE — Telephone Encounter (Addendum)
 FYI Only or Action Required?: FYI only for provider: appointment scheduled on 1/20.  Patient is followed in Pulmonology for asthma, last seen on 07/13/2024 by Kassie Acquanetta Bradley, MD.  Called Nurse Triage reporting Shortness of Breath.  Symptoms began several days ago.  Interventions attempted: Maintenance inhaler, Nebulizer treatments, and Other: ENT yesterday.  Symptoms are: gradually worsening.  Triage Disposition: See HCP Within 4 Hours (Or PCP Triage)  Patient/caregiver understands and will follow disposition?: Yes       This RN recommended pt be examined in next few hours, advised go to ED if anything different from her usual symptoms of illness or if any new or worsening symptoms, scheduled for today with pulm. Advised call back or seek immediate care if new or worsening symptoms.     Reason for Disposition  [1] Longstanding difficulty breathing (e.g., CHF, COPD, emphysema) AND [2] WORSE than normal  Answer Assessment - Initial Assessment Questions 1. RESPIRATORY STATUS: Describe your breathing? (e.g., wheezing, shortness of breath, unable to speak, severe coughing)      Mainly persistent dry cough SOB worse than usual with exertion Cough and congestion and post-nasal drainage really persistent, mouth full of mucus from nose, even when blow not much comes, white or really light light yellow Have been off-balance a little bit but not abnormal for her, just losing footing, confirms no dizziness or lightheadedness been using rescue nebulizer 1x/day since Thursday, some improvement but doesn't last long Chest pain only when cough  2. ONSET: When did this breathing problem begin?      Thursday  6. CARDIAC HISTORY: Do you have any history of heart disease? (e.g., heart attack, angina, bypass surgery, angioplasty)      HTN  7. LUNG HISTORY: Do you have any history of lung disease?  (e.g., pulmonary embolus, asthma, emphysema)     Asthma - wixela Last time illness  started this way, 5 weeks then admitted to hospital  9. OTHER SYMPTOMS: Do you have any other symptoms? (e.g., chest pain, cough, dizziness, fever, runny nose)     Headache, same side of nostril on that side, every day since Thursday headache - confirms not feeling like BP headache Maybe a little constipation, BM hard and small, balls Get nauseous quite often, not with off-balance, low appetite last few days, eating less, blood sugar 148 yesterday morning, usually 159-ish  Denies: Weakness Changes to consciousness/awareness/speech Dizziness/lightheadedness/wooziness Stridor Wheezing Vomiting/diarrhea Constipation Recent sudden vision changes Ability to check BP or blood sugar right now Fever  Protocols used: Breathing Difficulty-A-AH

## 2024-08-18 NOTE — Progress Notes (Signed)
 Dear Dr. Gari, Here is my assessment for our mutual patient, Molly Archer. Thank you for allowing me the opportunity to care for your patient. Please do not hesitate to contact me should you have any other questions. Sincerely, Chyrl Cohen PA-C  Otolaryngology Clinic Note Referring provider: Dr. Gari HPI:  Molly Archer is a 44 y.o. female kindly referred by Dr. Gari   Discussed the use of AI scribe software for clinical note transcription with the patient, who gave verbal consent to proceed.  History of Present Illness    Molly Archer is a 44 year old female with asthma who presents with persistent bilateral otalgia.  Since late November, she has experienced persistent bilateral ear pain, described as throbbing and worse on the right, particularly when lying down at night. She denies hearing loss. She was previously diagnosed with an ear infection, right greater than left, by her pulmonologist.  She reports ongoing nasal congestion and scratchy throat without fever. Nasal congestion and drainage are primarily from the left nostril and are associated with headache. Symptoms improved until Thursday of the prior week, when congestion and drainage recurred. She notes seasonal exacerbations, with similar episodes occurring in the fall and spring.  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  She has been treated with CPAP, prednisone , inhalers, Flonase  nasal spray, and a course of Levaquin  at the end of December. She is currently using Flonase , with a refill pending, and has some remaining. She could not recall the exact antibiotic previously used for her ears.           Independent Review of Additional Tests or Records:  none   PMH/Meds/All/SocHx/FamHx/ROS:   Past Medical History:  Diagnosis Date   Allergy    Anemia    pt states she took iron for this around 2019, and has had no  issues since   Arthritis    knees   Asthma    only uses inhaler in fall and spring   Constipation    Diabetes mellitus without complication (HCC)    GERD (gastroesophageal reflux disease)    diet controlled, occ. uses omeprazole    Headache    otc med prn   Heart murmur    History of kidney stones    Hx of Bell's palsy 12/2011   Hypertension    Menometrorrhagia    Nausea and vomiting 04/14/2024   Psoriasis    Shortness of breath dyspnea    with exercise/exertion   Vitamin D  deficiency      Past Surgical History:  Procedure Laterality Date   CHOLECYSTECTOMY N/A 01/01/2024   Procedure: LAPAROSCOPIC CHOLECYSTECTOMY;  Surgeon: Tanda Locus, MD;  Location: [facility] OR;  Service: General;  Laterality: N/A;   COLONOSCOPY N/A 05/06/2024   Procedure: COLONOSCOPY;  Surgeon: Abran Norleen SAILOR, MD;  Location: [facility];  Service: Gastroenterology;  Laterality: N/A;   COMBINED HYSTEROSCOPY DIAGNOSTIC / D&C N/A 07/30/2009   Benign secretory endometrium   CYSTOSCOPY WITH STENT PLACEMENT Left 10/19/2014   Procedure: CYSTOSCOPY WITH STENT PLACEMENT;  Surgeon: Morene LELON Salines, MD;  Location: [facility];  Service: Urology;  Laterality: Left;   DILATION AND CURETTAGE OF UTERUS  10/29/2003   Polyp, mild atypia, simple and complex hyperplasia,   DILATION AND CURETTAGE OF UTERUS  11/28/2003   Simple and complex hyperplasia   DILATION AND CURETTAGE OF UTERUS  07/30/2009   DILATION AND CURETTAGE OF UTERUS N/A 12/14/2014  Procedure: DILATATION AND CURETTAGE;  Surgeon: Winton Felt, MD;  Location: [facility];  Service: Gynecology;  Laterality: N/A;   ESOPHAGOGASTRODUODENOSCOPY N/A 05/06/2024   Procedure: EGD (ESOPHAGOGASTRODUODENOSCOPY);  Surgeon: Abran Norleen SAILOR, MD;  Location: [facility];  Service: Gastroenterology;  Laterality: N/A;   HOLMIUM LASER APPLICATION Left 10/19/2014   Procedure: HOLMIUM LASER APPLICATION;  Surgeon: Morene LELON Salines, MD;  Location: [facility];  Service: Urology;  Laterality: Left;    HYSTEROSCOPY WITH D & C N/A 07/21/2018   Procedure: DILATATION AND CURETTAGE /HYSTEROSCOPY;  Surgeon: Eveline Lynwood MATSU, MD;  Location: [facility];  Service: Gynecology;  Laterality: N/A;   INTRAUTERINE DEVICE (IUD) INSERTION N/A 12/14/2014   Procedure: INTRAUTERINE DEVICE (IUD) INSERTION;  Surgeon: Winton Felt, MD;  Location: [facility];  Service: Gynecology;  Laterality: N/A;   KNEE CARTILAGE SURGERY Left    STONE EXTRACTION WITH BASKET Left 10/19/2014   Procedure: STONE EXTRACTION WITH BASKET;  Surgeon: Morene LELON Salines, MD;  Location: [facility];  Service: Urology;  Laterality: Left;   WISDOM TOOTH EXTRACTION      Family History  Problem Relation Age of Onset   Hypertension Mother    Hyperlipidemia Mother    Diabetes Mother    Hypertension Father    Diabetes Father    Stroke Maternal Grandmother    Cancer Maternal Grandfather    Hypertension Other    Diabetes Other    Cancer Other    Cancer Maternal Aunt    Diabetes Maternal Aunt    Diabetes Maternal Uncle    Liver disease Neg Hx    Colon cancer Neg Hx    Esophageal cancer Neg Hx      Social Connections: Moderately Isolated (06/28/2024)   Social Connection and Isolation Panel    Frequency of Communication with Friends and Family: Twice a week    Frequency of Social Gatherings with Friends and Family: Once a week    Attends Religious Services: 1 to 4 times per year    Active Member of [employer] or Organizations: No    Attends Engineer, Structural: Not on file    Marital Status: Never married     Current Medications[1]   Physical Exam:   BP (!) 150/74   Pulse 82   Ht 5' 5 (1.651 m)   Wt (!) 324 lb (147 kg)   SpO2 96%   BMI 53.92 kg/m   Pertinent Findings  CN II-XII grossly intact Bilateral EAC clear and TM intact with well pneumatized middle ear spaces Anterior rhinoscopy: Septum midline; bilateral inferior turbinates with no moderate hypertrophy  No lesions of oral cavity/oropharynx; dentition WNL No obviously  palpable neck masses/lymphadenopathy/thyromegaly No respiratory distress or stridor    Seprately Identifiable Procedures:  None  Impression & Plans:  Molly Archer is a 44 y.o. female with the following   Assessment and Plan    Acute upper respiratory infection Recurrent congestion, drainage, and otalgia consistent with likely viral acute upper respiratory infection. No evidence of acute otitis media. Anticipated gradual improvement with supportive care. - Continued Fluticasone  nasal spray; refill ordered. - Initiated daily Loratadine  or Cetirizine; order placed. - Recommended saline sinus irrigation. - Advised increased oral hydration and salt water gargles. - Counseled to avoid pro-inflammatory foods and maintain a balanced diet. - Instructed to seek evaluation for hearing impairment or worsening unilateral otalgia.  Allergic rhinitis Allergic rhinitis with seasonal exacerbations. Current symptoms may be due to environmental allergens like dust. - Continued Fluticasone  nasal spray; refill ordered. - Initiated  daily Loratadine  or Cetirizine; order placed. - Recommended saline sinus irrigation.           - f/u PRN   Thank you for allowing me the opportunity to care for your patient. Please do not hesitate to contact me should you have any other questions.  Sincerely, Chyrl Cohen PA-C [facility] Phone: [phone] Fax: [phone]  08/18/2024, 10:26 AM        [1]  Current Outpatient Medications:    fluticasone  (FLONASE ) 50 MCG/ACT nasal spray, Place 2 sprays into both nostrils daily., Disp: 16 g, Rfl: 6   loratadine  (CLARITIN ) 10 MG tablet, Take 1 tablet (10 mg total) by mouth daily., Disp: 30 tablet, Rfl: 11   Accu-Chek Softclix Lancets lancets, by Other route. Use as instructed, Disp: , Rfl:    albuterol  (PROVENTIL ) (2.5 MG/3ML) 0.083% nebulizer solution, Inhale 3 mLs into the lungs every 6 (six) hours as needed for wheezing., Disp: 360 mL,  Rfl: 1   albuterol  (VENTOLIN  HFA) 108 (90 Base) MCG/ACT inhaler, Inhale 2 puffs into the lungs every 4 (four) hours as needed for wheezing or shortness of breath (cough, shortness of breath or wheezing.)., Disp: 18 g, Rfl: 1   Blood Glucose Monitoring Suppl (BLOOD GLUCOSE MONITOR SYSTEM) w/Device KIT, Use as directed in the morning, at noon, and at bedtime., Disp: 1 kit, Rfl: 0   Ciclopirox  1 % shampoo, Apply to the affected area(s) topically at bedtime., Disp: 120 mL, Rfl: 0   clobetasol  ointment (TEMOVATE ) 0.05 %, Apply 1 Application topically 2 (two) times daily., Disp: 30 g, Rfl: 1   dicyclomine  (BENTYL ) 10 MG capsule, Take 1 capsule (10 mg total) by mouth 2 (two) times daily as needed for spasms. (Patient not taking: Reported on 07/20/2024), Disp: 60 capsule, Rfl: 1   doxazosin  (CARDURA ) 1 MG tablet, Take 1 tablet (1 mg total) by mouth daily., Disp: 30 tablet, Rfl: 3   famotidine  (PEPCID ) 20 MG tablet, Take 1 tablet (20 mg total) by mouth 2 (two) times daily. (Patient not taking: Reported on 07/20/2024), Disp: 180 tablet, Rfl: 3   Fluocinolone  Acetonide Scalp 0.01 % OIL, Apply topically., Disp: , Rfl:    Fluocinolone  Acetonide Scalp 0.01 % OIL, Apply 1 Application topically 2 (two) times a week., Disp: 118.28 mL, Rfl: 1   fluticasone  (FLONASE ) 50 MCG/ACT nasal spray, Place 2 sprays into both nostrils daily., Disp: 16 g, Rfl: 6   fluticasone -salmeterol (WIXELA INHUB ) 100-50 MCG/ACT AEPB, Inhale 1 puff into the lungs 2 (two) times daily., Disp: 60 each, Rfl: 5   Glucose Blood (BLOOD GLUCOSE TEST STRIPS 333 VI), 100 each by In Vitro route 2 (two) times daily before a meal., Disp: , Rfl:    hydrochlorothiazide  (MICROZIDE ) 12.5 MG capsule, Take 1 capsule (12.5 mg total) by mouth daily., Disp: 90 capsule, Rfl: 1   insulin  glargine (LANTUS  SOLOSTAR) 100 UNIT/ML Solostar Pen, Inject 12 Units into the skin daily., Disp: 15 mL, Rfl: 3   Insulin  Pen Needle (PEN NEEDLES) 32G X 4 MM MISC, Use as directed for  injecting insulin , Disp: 100 each, Rfl: 2   irbesartan -hydrochlorothiazide  (AVALIDE) 300-12.5 MG tablet, Take 1 tablet by mouth daily., Disp: 90 tablet, Rfl: 1   levalbuterol  (XOPENEX ) 0.63 MG/3ML nebulizer solution, Take 3 mLs (0.63 mg total) by nebulization every 6 (six) hours for 8 days. (Patient not taking: Reported on 07/20/2024), Disp: 75 mL, Rfl: 0   lidocaine  (XYLOCAINE ) 2 % solution, Use as directed 15 mLs in the mouth or throat  every 4 (four) hours as needed. (Patient not taking: Reported on 07/20/2024), Disp: 270 mL, Rfl: 0   metoCLOPramide  (REGLAN ) 10 MG tablet, Take 1 tablet (10 mg total) by mouth every 8 (eight) hours as needed for Nausea, Disp: 30 tablet, Rfl: 1   misoprostol (CYTOTEC) 200 MCG tablet, SMARTSIG:1 Tablet(s) By Mouth, Disp: , Rfl:    Naftifine  HCl 2 % CREA, Apply liberally to affected area daily, Disp: 60 g, Rfl: 1   Naphazoline-Pheniramine (ALLERGY EYE OP), Place 1 drop into both eyes daily as needed (allergies)., Disp: , Rfl:    NIFEdipine  (ADALAT  CC) 60 MG 24 hr tablet, Take 60 mg by mouth daily., Disp: , Rfl:    NIFEdipine  (PROCARDIA  XL/NIFEDICAL XL) 60 MG 24 hr tablet, Take 2 tablets (120 mg total) by mouth daily., Disp: 60 tablet, Rfl: 0   nitroGLYCERIN  (NITRODUR - DOSED IN MG/24 HR) 0.1 mg/hr patch, Place 1 patch (0.1 mg total) onto the skin daily. (Patient not taking: Reported on 07/20/2024), Disp: 30 patch, Rfl: 12   norethindrone  (AYGESTIN ) 5 MG tablet, Take 3 tablets (15 mg total) by mouth daily., Disp: 180 tablet, Rfl: 5   Oxycodone  HCl 10 MG TABS, Take 1 tablet (10 mg total) by mouth every 8 (eight) hours as needed., Disp: 75 tablet, Rfl: 0   pantoprazole  (PROTONIX ) 40 MG tablet, Take 1 tablet (40 mg total) by mouth daily., Disp: 90 tablet, Rfl: 3   predniSONE  (STERAPRED UNI-PAK 21 TAB) 10 MG (21) TBPK tablet, Use per package instruction, Disp: 21 tablet, Rfl: 0   promethazine -dextromethorphan  (PROMETHAZINE -DM) 6.25-15 MG/5ML syrup, Take 5 mLs by mouth every 6  (six) hours as needed., Disp: 180 mL, Rfl: 0   sucralfate  (CARAFATE ) 1 g tablet, Take 1 tablet (1 g total) by mouth 4 (four) times daily., Disp: 360 tablet, Rfl: 1   tiZANidine  (ZANAFLEX ) 4 MG tablet, Take 1 tablet (4 mg total) by mouth every 8 (eight) hours as needed for muscle spasms., Disp: 30 tablet, Rfl: 0   triamcinolone  cream (KENALOG ) 0.1 %, Apply to the affected area(s) topically 2 (two) times daily., Disp: 30 g, Rfl: 0   zolpidem  (AMBIEN ) 5 MG tablet, Take 1 tablet (5 mg total) by mouth at bedtime as needed for sleep., Disp: 30 tablet, Rfl: 0

## 2024-08-18 NOTE — Progress Notes (Signed)
 "   Subjective:   PATIENT ID: [name] GENDER: female DOB: November 09, 1980, MRN: [id number]   HPI Discussed the use of AI scribe software for clinical note transcription with the patient, who gave verbal consent to proceed.  History of Present Illness MASIYA CLAASSEN is a 44 year old female with asthma who presents with persistent cough and congestion.  She was admitted to the hospital on December 1st and diagnosed with asthma after experiencing a persistent cough for weeks. Upon discharge, she was prescribed a steroid taper and has a nebulizer at home. She also uses a liquid medication that is less potent than albuterol , likely Pulmicort , and has both levalbuterol  and albuterol  inhalers. Additionally, she uses a powder inhaler, Wixela (fluticasone /salmeterol), twice daily, two puffs each time. She completed the steroid taper in early December.  She experiences significant sinus congestion, primarily in the left nostril, accompanied by headaches and postnasal drip, which causes gagging. She uses Flonase  for nasal congestion. She also experiences a tight sensation when coughing. No significant heartburn or acid reflux.  She had a follow-up with an ear, nose, and throat specialist due to an ear infection and was advised to use Claritin , although she finds it ineffective. She works in a school environment and has noticed that the cold air has not been helping her symptoms.  She has a history of asthma since childhood but has not had significant issues until recently. Spring and fall typically trigger bronchitis or similar symptoms.     Past Medical History:  Diagnosis Date   Allergy    Anemia    pt states she took iron for this around 2019, and has had no issues since   Arthritis    knees   Asthma    only uses inhaler in fall and spring   Constipation    Diabetes mellitus without complication (HCC)    GERD (gastroesophageal reflux disease)    diet controlled, occ. uses omeprazole     Headache    otc med prn   Heart murmur    History of kidney stones    Hx of Bell's palsy 12/2011   Hypertension    Menometrorrhagia    Nausea and vomiting 04/14/2024   Psoriasis    Shortness of breath dyspnea    with exercise/exertion   Vitamin D  deficiency      Family History  Problem Relation Age of Onset   Hypertension Mother    Hyperlipidemia Mother    Diabetes Mother    Hypertension Father    Diabetes Father    Stroke Maternal Grandmother    Cancer Maternal Grandfather    Hypertension Other    Diabetes Other    Cancer Other    Cancer Maternal Aunt    Diabetes Maternal Aunt    Diabetes Maternal Uncle    Liver disease Neg Hx    Colon cancer Neg Hx    Esophageal cancer Neg Hx      Social History   Socioeconomic History   Marital status: Single    Spouse name: Not on file   Number of children: 0   Years of education: Not on file   Highest education level: Bachelor's degree (e.g., BA, AB, BS)  Occupational History   Not on file  Tobacco Use   Smoking status: Never    Passive exposure: Never   Smokeless tobacco: Never   Tobacco comments:    Never Smoked  Vaping Use   Vaping status: Never Used  Substance and Sexual Activity  Alcohol use: Never   Drug use: Never   Sexual activity: Not Currently    Birth control/protection: Abstinence, Pill  Other Topics Concern   Not on file  Social History Narrative   She is from [place], [place], moved here 1998.    Lives at home by herself.    Social Drivers of Health   Tobacco Use: Low Risk (08/18/2024)   Patient History    Smoking Tobacco Use: Never    Smokeless Tobacco Use: Never    Passive Exposure: Never  Financial Resource Strain: Medium Risk (06/28/2024)   Overall Financial Resource Strain (CARDIA)    Difficulty of Paying Living Expenses: Somewhat hard  Food Insecurity: No Food Insecurity (06/29/2024)   Epic    Worried About Programme Researcher, Broadcasting/film/video in the Last Year: Never true    Ran Out of Food in the Last  Year: Never true  Recent Concern: Food Insecurity - Food Insecurity Present (06/28/2024)   Epic    Worried About Programme Researcher, Broadcasting/film/video in the Last Year: Sometimes true    [employer] of Food in the Last Year: Sometimes true  Transportation Needs: No Transportation Needs (06/29/2024)   Epic    Lack of Transportation (Medical): No    Lack of Transportation (Non-Medical): No  Physical Activity: Insufficiently Active (06/28/2024)   Exercise Vital Sign    Days of Exercise per Week: 2 days    Minutes of Exercise per Session: 20 min  Stress: Stress Concern Present (06/28/2024)   [employer] of Occupational Health - Occupational Stress Questionnaire    Feeling of Stress: Very much  Social Connections: Moderately Isolated (06/28/2024)   Social Connection and Isolation Panel    Frequency of Communication with Friends and Family: Twice a week    Frequency of Social Gatherings with Friends and Family: Once a week    Attends Religious Services: 1 to 4 times per year    Active Member of [employer] or Organizations: No    Attends Engineer, Structural: Not on file    Marital Status: Never married  Intimate Partner Violence: Not At Risk (06/29/2024)   Epic    Fear of Current or Ex-Partner: No    Emotionally Abused: No    Physically Abused: No    Sexually Abused: No  Depression (PHQ2-9): Medium Risk (07/22/2024)   Depression (PHQ2-9)    PHQ-2 Score: 10  Alcohol Screen: Low Risk (07/20/2024)   Alcohol Screen    Last Alcohol Screening Score (AUDIT): 0  Housing: Low Risk (06/29/2024)   Epic    Unable to Pay for Housing in the Last Year: No    Number of Times Moved in the Last Year: 0    Homeless in the Last Year: No  Recent Concern: Housing - High Risk (06/28/2024)   Epic    Unable to Pay for Housing in the Last Year: Yes    Number of Times Moved in the Last Year: 0    Homeless in the Last Year: No  Utilities: Not At Risk (06/29/2024)   Epic    Threatened with loss of utilities: No   Health Literacy: Adequate Health Literacy (07/20/2024)   B1300 Health Literacy    Frequency of need for help with medical instructions: Never     Allergies[1]   Outpatient Medications Prior to Visit  Medication Sig Dispense Refill   Accu-Chek Softclix Lancets lancets by Other route. Use as instructed     albuterol  (PROVENTIL ) (2.5 MG/3ML) 0.083% nebulizer solution Inhale  3 mLs into the lungs every 6 (six) hours as needed for wheezing. 360 mL 1   albuterol  (VENTOLIN  HFA) 108 (90 Base) MCG/ACT inhaler Inhale 2 puffs into the lungs every 4 (four) hours as needed for wheezing or shortness of breath (cough, shortness of breath or wheezing.). 18 g 1   Blood Glucose Monitoring Suppl (BLOOD GLUCOSE MONITOR SYSTEM) w/Device KIT Use as directed in the morning, at noon, and at bedtime. 1 kit 0   Ciclopirox  1 % shampoo Apply to the affected area(s) topically at bedtime. 120 mL 0   clobetasol  ointment (TEMOVATE ) 0.05 % Apply 1 Application topically 2 (two) times daily. 30 g 0   dicyclomine  (BENTYL ) 10 MG capsule Take 1 capsule (10 mg total) by mouth 2 (two) times daily as needed for spasms. 60 capsule 1   doxazosin  (CARDURA ) 1 MG tablet Take 1 tablet (1 mg total) by mouth daily. 30 tablet 3   Fluocinolone  Acetonide Scalp 0.01 % OIL Apply topically.     Fluocinolone  Acetonide Scalp 0.01 % OIL Apply 1 Application topically 2 (two) times a week. 118.28 mL 1   fluticasone  (FLONASE ) 50 MCG/ACT nasal spray Place 2 sprays into both nostrils daily. 16 g 6   fluticasone  (FLONASE ) 50 MCG/ACT nasal spray Place 2 sprays into both nostrils daily. 16 g 6   Glucose Blood (BLOOD GLUCOSE TEST STRIPS 333 VI) 100 each by In Vitro route 2 (two) times daily before a meal.     hydrochlorothiazide  (MICROZIDE ) 12.5 MG capsule Take 1 capsule (12.5 mg total) by mouth daily. 90 capsule 1   insulin  glargine (LANTUS  SOLOSTAR) 100 UNIT/ML Solostar Pen Inject 12 Units into the skin daily. 15 mL 3   Insulin  Pen Needle (PEN NEEDLES)  32G X 4 MM MISC Use as directed for injecting insulin  100 each 2   irbesartan -hydrochlorothiazide  (AVALIDE) 300-12.5 MG tablet Take 1 tablet by mouth daily. 90 tablet 1   levalbuterol  (XOPENEX ) 0.63 MG/3ML nebulizer solution Take 3 mLs (0.63 mg total) by nebulization every 6 (six) hours for 8 days. 75 mL 0   lidocaine  (XYLOCAINE ) 2 % solution Use as directed 15 mLs in the mouth or throat every 4 (four) hours as needed. 270 mL 0   Naftifine  HCl 2 % CREA Apply liberally to affected area daily 60 g 0   Naphazoline-Pheniramine (ALLERGY EYE OP) Place 1 drop into both eyes daily as needed (allergies).     NIFEdipine  (ADALAT  CC) 60 MG 24 hr tablet Take 60 mg by mouth daily.     NIFEdipine  (PROCARDIA  XL/NIFEDICAL XL) 60 MG 24 hr tablet Take 2 tablets (120 mg total) by mouth daily. 60 tablet 0   nitroGLYCERIN  (NITRODUR - DOSED IN MG/24 HR) 0.1 mg/hr patch Place 1 patch (0.1 mg total) onto the skin daily. 30 patch 12   norethindrone  (AYGESTIN ) 5 MG tablet Take 3 tablets (15 mg total) by mouth daily. 180 tablet 5   Oxycodone  HCl 10 MG TABS Take 1 tablet (10 mg total) by mouth every 8 (eight) hours as needed. 75 tablet 0   pantoprazole  (PROTONIX ) 40 MG tablet Take 1 tablet (40 mg total) by mouth daily. 90 tablet 3   promethazine -dextromethorphan  (PROMETHAZINE -DM) 6.25-15 MG/5ML syrup Take 5 mLs by mouth every 6 (six) hours as needed. 180 mL 0   sucralfate  (CARAFATE ) 1 g tablet Take 1 tablet (1 g total) by mouth 4 (four) times daily. 360 tablet 1   tiZANidine  (ZANAFLEX ) 4 MG tablet Take 1 tablet (4 mg total) by  mouth every 8 (eight) hours as needed for muscle spasms. 30 tablet 0   triamcinolone  cream (KENALOG ) 0.1 % Apply to the affected area(s) topically 2 (two) times daily. 30 g 0   zolpidem  (AMBIEN ) 5 MG tablet Take 1 tablet (5 mg total) by mouth at bedtime as needed for sleep. 30 tablet 0   fluticasone -salmeterol (WIXELA INHUB ) 100-50 MCG/ACT AEPB Inhale 1 puff into the lungs 2 (two) times daily. 60 each 5    famotidine  (PEPCID ) 20 MG tablet Take 1 tablet (20 mg total) by mouth 2 (two) times daily. (Patient not taking: Reported on 08/18/2024) 180 tablet 3   loratadine  (CLARITIN ) 10 MG tablet Take 1 tablet (10 mg total) by mouth daily. (Patient not taking: Reported on 08/18/2024) 30 tablet 11   metoCLOPramide  (REGLAN ) 10 MG tablet Take 1 tablet (10 mg total) by mouth every 8 (eight) hours as needed for Nausea (Patient not taking: Reported on 08/18/2024) 30 tablet 1   misoprostol (CYTOTEC) 200 MCG tablet SMARTSIG:1 Tablet(s) By Mouth (Patient not taking: Reported on 08/18/2024)     predniSONE  (STERAPRED UNI-PAK 21 TAB) 10 MG (21) TBPK tablet Use per package instruction 21 tablet 0   No facility-administered medications prior to visit.    ROS Reviewed all systems and reported negative except as above     Objective:   Vitals:   08/18/24 1502  BP: 134/78  Pulse: 87  TempSrc: Oral  SpO2: 98%  Weight: (!) 321 lb 12.8 oz (146 kg)  Height: 5' 5 (1.651 m)    Physical Exam Physical Exam GENERAL: Appropriate to age, no acute distress. HEAD EYES EARS NOSE THROAT: Moist mucous membranes, atraumatic, normocephalic. CHEST: Clear to auscultation bilaterally, no wheezing, mild tightness noted, no crackles, no rales. CARDIAC: Regular rate and rhythm, normal S1, normal S2, no murmurs, no rubs, no gallops. ABDOMEN: Soft, nontender. NEUROLOGICAL: Motor and sensation grossly intact, alert and oriented times X 3. EXTREMITIES: Warm, well perfused, no edema.     CBC    Component Value Date/Time   WBC 7.4 07/13/2024 0903   WBC 11.2 (H) 06/30/2024 0314   RBC 5.16 07/13/2024 0903   RBC 4.96 06/30/2024 0314   HGB 14.6 07/13/2024 0903   HCT 45.3 07/13/2024 0903   PLT 330 07/13/2024 0903   MCV 88 07/13/2024 0903   MCH 28.3 07/13/2024 0903   MCH 28.2 06/30/2024 0314   MCHC 32.2 07/13/2024 0903   MCHC 33.9 06/30/2024 0314   RDW 13.9 07/13/2024 0903   LYMPHSABS 1.4 07/13/2024 0903   MONOABS 0.8  01/02/2024 1609   EOSABS 0.2 07/13/2024 0903   BASOSABS 0.1 07/13/2024 0903     Results    PFT:     No data to display               Assessment & Plan:   Assessment and Plan Assessment & Plan Mild intermittent asthma with acute exacerbation Exacerbation likely due to postnasal drip and environmental factors. Current maintenance therapy insufficient. - Increased Wixela to 250 mcg, two puffs twice daily. - Prescribed prednisone  taper. - Use rescue albuterol  and nebulizer as needed. - Use nebulizer two to three times daily during exacerbation. - Educated on daily maintenance inhaler use and proper technique. - Advised to call if no improvement in 2-3 days.  Chronic rhinitis with postnasal drip Chronic rhinitis with significant postnasal drip contributing to asthma exacerbation. Current treatment insufficient. - Prescribed Atrovent  nasal spray to reduce postnasal drip. - Continue Flonase  as needed. - Educated on  potential side effects of Atrovent , including risk of epistaxis if overused.        Zola Herter, MD [facility] Office: [phone]       [1]  Allergies Allergen Reactions   Clindamycin     Codeine Hives   Doxycycline  Nausea Only    Pt states causes nausea and vomiting.   Losartan Other (See Comments)    headache   Penicillins Hives   Tramadol  Hives   "

## 2024-08-19 ENCOUNTER — Other Ambulatory Visit: Payer: Self-pay

## 2024-08-19 ENCOUNTER — Other Ambulatory Visit (HOSPITAL_BASED_OUTPATIENT_CLINIC_OR_DEPARTMENT_OTHER): Payer: Self-pay

## 2024-08-21 ENCOUNTER — Other Ambulatory Visit (HOSPITAL_BASED_OUTPATIENT_CLINIC_OR_DEPARTMENT_OTHER): Payer: Self-pay

## 2024-08-24 ENCOUNTER — Ambulatory Visit: Admitting: Licensed Clinical Social Worker

## 2024-08-26 ENCOUNTER — Ambulatory Visit (HOSPITAL_BASED_OUTPATIENT_CLINIC_OR_DEPARTMENT_OTHER): Admitting: Pulmonary Disease

## 2024-08-26 ENCOUNTER — Encounter (HOSPITAL_BASED_OUTPATIENT_CLINIC_OR_DEPARTMENT_OTHER): Payer: Self-pay

## 2024-08-26 ENCOUNTER — Encounter (HOSPITAL_BASED_OUTPATIENT_CLINIC_OR_DEPARTMENT_OTHER): Payer: Self-pay | Admitting: Pulmonary Disease

## 2024-08-26 ENCOUNTER — Encounter (HOSPITAL_BASED_OUTPATIENT_CLINIC_OR_DEPARTMENT_OTHER)

## 2024-08-26 ENCOUNTER — Other Ambulatory Visit (HOSPITAL_BASED_OUTPATIENT_CLINIC_OR_DEPARTMENT_OTHER): Payer: Self-pay

## 2024-08-26 VITALS — BP 130/88 | HR 92 | Ht 65.0 in | Wt 319.7 lb

## 2024-08-26 DIAGNOSIS — J4540 Moderate persistent asthma, uncomplicated: Secondary | ICD-10-CM

## 2024-08-26 DIAGNOSIS — B3731 Acute candidiasis of vulva and vagina: Secondary | ICD-10-CM

## 2024-08-26 MED ORDER — FLUCONAZOLE 150 MG PO TABS
150.0000 mg | ORAL_TABLET | Freq: Every day | ORAL | 0 refills | Status: AC
  Filled 2024-08-26: qty 1, 1d supply, fill #0

## 2024-08-26 NOTE — Progress Notes (Signed)
 "   Subjective:   PATIENT ID: Molly Archer GENDER: female DOB: Oct 20, 1980, MRN: [id number]  Chief Complaint  Patient presents with   Asthma    Reason for Visit: Follow-up      Molly Archer is a 44 y.o. female never with asthma, DM2, HTN, GERD, hx spontaneous PTX peri-op, chronic headaches and lumbar radiculopathy and morbid obesity who presents for evaluation for asthma.  Initial consult  She has been experiencing coughing, wheezing, and shortness of breath since the week of Thanksgiving. Initially, she visited urgent care and was diagnosed with bronchitis, receiving a steroid, a breathing treatment, a Z-Pak, and cough medicine. Despite these treatments, her symptoms persisted, leading to a visit to the ER where she was wheezing severely. She was given the option to be admitted or go home; she chose to go home but was later admitted to [facility] due to severe symptoms.  She tested negative for COVID, flu, and RSV. Her white blood cell count was elevated, but no specific bacterial infection was identified. She has a history of asthma diagnosed in childhood but reports that significant symptoms began in her early thirties, with exacerbations typically occurring in the fall and spring.  She has been using inhalers since middle school, including albuterol , Proventil , Ventolin , and Advair  Disc. Recently, she has been using a nebulizer every six hours but reduced frequency due to throat soreness. She also uses levalbuterol , which she alternates with albuterol .  She denies smoking but is sensitive to smoke and strong odors, which can trigger her asthma. 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 She has no pets and uses an air purifier and humidifier at home.  She reports a productive cough, especially at night. No fever or chills. She has a history of diabetes, which complicates steroid use due to potential blood sugar elevation.     08/26/2024 Discussed the use of AI scribe software for clinical note transcription with the patient, who gave verbal consent to proceed.  History of Present Illness Since our last visit she was treated for exacerbation in December but then was seen in follow-up with Dr. Donzetta on 08/18/24 for asthma exacerbation. Coughing has improved but still having nasal congestion. Wixela was increased to 250 mcg however she has not started it and remained on low-dose. She has four days of prednisone  left. She has been taking atrovent  nasal spray 2-3 times a day. Has had some nosebleeding. She reports spring and fall her asthma symptoms were her worst seasons.   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 uses omeprazole    Headache    otc med prn   Heart murmur    History of kidney stones    Hx of Bell's palsy 12/2011   Hypertension    Menometrorrhagia    Nausea and vomiting 04/14/2024   Psoriasis    Shortness of breath dyspnea    with exercise/exertion   Vitamin D  deficiency      Family History  Problem Relation Age of Onset   Hypertension Mother    Hyperlipidemia Mother    Diabetes Mother    Hypertension Father    Diabetes Father    Stroke Maternal Grandmother    Cancer Maternal Grandfather    Hypertension Other    Diabetes Other    Cancer  Other    Cancer Maternal Aunt    Diabetes Maternal Aunt    Diabetes Maternal Uncle    Liver disease Neg Hx    Colon cancer Neg Hx    Esophageal cancer Neg Hx      Social History   Occupational History   Not on file  Tobacco Use   Smoking status: Never    Passive exposure: Never   Smokeless tobacco: Never   Tobacco comments:    Never Smoked  Vaping Use   Vaping status: Never Used  Substance and Sexual Activity   Alcohol use: Never   Drug use: Never   Sexual activity: Not Currently    Birth control/protection: Abstinence, Pill    Allergies[1]   Outpatient Medications Prior to Visit  Medication Sig Dispense Refill   Accu-Chek Softclix Lancets lancets by Other route. Use as instructed     albuterol  (PROVENTIL ) (2.5 MG/3ML) 0.083% nebulizer solution Inhale 3 mLs into the lungs every 6 (six) hours as needed for wheezing. 360 mL 1   albuterol  (VENTOLIN  HFA) 108 (90 Base) MCG/ACT inhaler Inhale 2 puffs into the lungs every 4 (four) hours as needed for wheezing or shortness of breath (cough, shortness of breath or wheezing.). 18 g 1   Blood Glucose Monitoring Suppl (BLOOD GLUCOSE MONITOR SYSTEM) w/Device KIT Use as directed in the morning, at noon, and at bedtime. 1 kit 0   Ciclopirox  1 % shampoo Apply to the affected area(s) topically at bedtime. 120 mL 0   clobetasol  ointment (TEMOVATE ) 0.05 % Apply 1 Application topically 2 (two) times daily. 30 g 0   dicyclomine  (BENTYL ) 10 MG capsule Take 1 capsule (10 mg total) by mouth 2 (two) times daily as needed for spasms. 60 capsule 1   doxazosin  (CARDURA ) 1 MG tablet Take 1 tablet (1 mg total) by mouth daily. 30 tablet 3   famotidine  (PEPCID ) 20 MG tablet Take 1 tablet (20 mg total) by mouth 2 (two) times daily. 180 tablet 3   Fluocinolone  Acetonide Scalp 0.01 % OIL Apply topically.     Fluocinolone  Acetonide Scalp 0.01 % OIL Apply 1 Application topically 2 (two) times a week. 118.28 mL 1   fluticasone  (FLONASE ) 50 MCG/ACT nasal spray  Place 2 sprays into both nostrils daily. 16 g 6   fluticasone  (FLONASE ) 50 MCG/ACT nasal spray Place 2 sprays into both nostrils daily. 16 g 6   fluticasone -salmeterol (WIXELA INHUB ) 250-50 MCG/ACT AEPB Inhale 1 puff into the lungs in the morning and at bedtime. 60 each 5   Glucose Blood (BLOOD GLUCOSE TEST STRIPS 333 VI) 100 each by In Vitro route 2 (two) times daily  before a meal.     hydrochlorothiazide  (MICROZIDE ) 12.5 MG capsule Take 1 capsule (12.5 mg total) by mouth daily. 90 capsule 1   insulin  glargine (LANTUS  SOLOSTAR) 100 UNIT/ML Solostar Pen Inject 12 Units into the skin daily. 15 mL 3   Insulin  Pen Needle (PEN NEEDLES) 32G X 4 MM MISC Use as directed for injecting insulin  100 each 2   ipratropium (ATROVENT ) 0.06 % nasal spray Place 2 sprays into both nostrils 4 (four) times daily. 15 mL 12   irbesartan -hydrochlorothiazide  (AVALIDE) 300-12.5 MG tablet Take 1 tablet by mouth daily. 90 tablet 1   levalbuterol  (XOPENEX ) 0.63 MG/3ML nebulizer solution Take 3 mLs (0.63 mg total) by nebulization every 6 (six) hours for 8 days. 75 mL 0   lidocaine  (XYLOCAINE ) 2 % solution Use as directed 15 mLs in the mouth or throat every 4 (four) hours as needed. 270 mL 0   loratadine  (CLARITIN ) 10 MG tablet Take 1 tablet (10 mg total) by mouth daily. 30 tablet 11   metoCLOPramide  (REGLAN ) 10 MG tablet Take 1 tablet (10 mg total) by mouth every 8 (eight) hours as needed for Nausea 30 tablet 1   misoprostol (CYTOTEC) 200 MCG tablet SMARTSIG:1 Tablet(s) By Mouth     Naftifine  HCl 2 % CREA Apply liberally to affected area daily 60 g 0   Naphazoline-Pheniramine (ALLERGY EYE OP) Place 1 drop into both eyes daily as needed (allergies).     NIFEdipine  (ADALAT  CC) 60 MG 24 hr tablet Take 60 mg by mouth daily.     NIFEdipine  (PROCARDIA  XL/NIFEDICAL XL) 60 MG 24 hr tablet Take 2 tablets (120 mg total) by mouth daily. 60 tablet 0   nitroGLYCERIN  (NITRODUR - DOSED IN MG/24 HR) 0.1 mg/hr patch Place 1 patch (0.1 mg  total) onto the skin daily. 30 patch 12   norethindrone  (AYGESTIN ) 5 MG tablet Take 3 tablets (15 mg total) by mouth daily. 180 tablet 5   Oxycodone  HCl 10 MG TABS Take 1 tablet (10 mg total) by mouth every 8 (eight) hours as needed. 75 tablet 0   pantoprazole  (PROTONIX ) 40 MG tablet Take 1 tablet (40 mg total) by mouth daily. 90 tablet 3   predniSONE  (DELTASONE ) 20 MG tablet Take 3 tablets (60 mg total) by mouth daily with breakfast for 2 days, THEN 2 tablets (40 mg total) daily for 3 days, THEN 1.5 tablets (30 mg total) daily for 3 days, THEN 1 tablet (20 mg total) daily for 3 days, THEN 0.5 tablets (10 mg total) daily for 3 days. 21 tablet 0   promethazine -dextromethorphan  (PROMETHAZINE -DM) 6.25-15 MG/5ML syrup Take 5 mLs by mouth every 6 (six) hours as needed. 180 mL 0   sucralfate  (CARAFATE ) 1 g tablet Take 1 tablet (1 g total) by mouth 4 (four) times daily. 360 tablet 1   tiZANidine  (ZANAFLEX ) 4 MG tablet Take 1 tablet (4 mg total) by mouth every 8 (eight) hours as needed for muscle spasms. 30 tablet 0   triamcinolone  cream (KENALOG ) 0.1 % Apply to the affected area(s) topically 2 (two) times daily. 30 g 0   zolpidem  (AMBIEN ) 5 MG tablet Take 1 tablet (5 mg total) by mouth at bedtime as needed for sleep. 30 tablet 0   No facility-administered medications prior to visit.    Review of Systems  Constitutional:  Negative for chills, diaphoresis, fever, malaise/fatigue and weight loss.  HENT:  Positive for congestion.   Respiratory:  Positive for cough. Negative for hemoptysis, sputum production, shortness of breath  and wheezing.   Cardiovascular:  Negative for chest pain, palpitations and leg swelling.     Objective:   Vitals:   08/26/24 1101  BP: 130/88  Pulse: 92  SpO2: 98%  Weight: (!) 319 lb 11.2 oz (145 kg)  Height: 5' 5 (1.651 m)   SpO2: 98 %  Physical Exam: General: Well-appearing, no acute distress HENT: [place], AT Eyes: EOMI, no scleral icterus Respiratory: Clear to  auscultation bilaterally.  No crackles, wheezing or rales Cardiovascular: RRR, -M/R/G, no JVD Extremities:-Edema,-tenderness Neuro: AAO x4, CNII-XII grossly intact Psych: Normal mood, normal affect  Data Reviewed:  Imaging: CT Chest 01/02/24 - Small right PTX with right chest tube, subcutaneous emphysema in right chest wall. Consolidation in right lung.  CXR 01/05/24 - Resolved ptx  CT CAP 02/21/24 - Tiny small, subcentimeter locule of gas along the medial right middle lobe. Trace left pleural effusion with left basilar atelectasis. Right apical calcified granuloma. 3 mm nodular opacity in the subpleural RUL  PFT: None on file  Labs: Absolute eos 07/13/24 -200 IgE - 56     Assessment & Plan:   Discussion: 44 y.o. female never with asthma, DM2, HTN, GERD, hx spontaneous PTX peri-op, chronic headaches and lumbar radiculopathy and morbid obesity who presents for follow-up. Recent asthma exacerbation. 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 Historically has 1-2 exacerbations annually and would likely benefit from maintenance therapy. This has been the most frequent her exacerbations have occurred. We briefly discussed biologics however recommend optimizing ICS/LABA first.  Previously tried Advair  Diskus last year with partial relief   Assessment & Plan Moderate persistent asthma without complication --Complete prednisone  taper  --START Wixela 250-50 mcg ONE puff in the morning and evening. Rinse mouth out after use --CONTINUE Albuterol  TWO puffs as needed for shortness of breath and wheezing --RESCHEDULE pulmonary function tests --Start neti pot every 2-3 days --Continue nasal sprays, alternate with flonase  and atrovent  as needed for nasal congestion  Asthma Action Plan Use albuterol  to 2 puffs three times a day for worsening shortness of breath, wheezing and cough. If you symptoms do not improve in 24-48 hours, please our office for evaluation  and/or prednisone  taper.  Vulvovaginal candidiasis --Fluconazole  ordered  Health Maintenance Immunization History  Administered Date(s) Administered   Influenza,inj,Quad PF,6+ Mos 06/03/2019   MMR 07/03/2008   PPD Test 04/29/2019   Tdap 07/03/2008   CT Lung Screen - never smoker. Not qualified  No orders of the defined types were placed in this encounter.  Meds ordered this encounter  Medications   fluconazole  (DIFLUCAN ) 150 MG tablet    Sig: Take 1 tablet (150 mg total) by mouth daily.    Dispense:  1 tablet    Refill:  0    Return in about 3 months (around 11/24/2024).  I have spent a total time of 35-minutes on the day of the appointment including chart review, data review, collecting history, coordinating care and discussing medical diagnosis and plan with the patient/family. Past medical history, allergies, medications were reviewed. Pertinent imaging, labs and tests included in this note have been reviewed and interpreted independently by me.  Molly Archer Staff, MD [facility] 08/26/2024 11:41 AM         [1]  Allergies Allergen Reactions   Clindamycin     Codeine Hives   Doxycycline  Nausea Only    Pt states causes nausea and vomiting.   Losartan Other (See Comments)    headache  Penicillins Hives   Tramadol  Hives   "

## 2024-08-26 NOTE — Assessment & Plan Note (Addendum)
--  Complete prednisone  taper  --START Wixela 250-50 mcg ONE puff in the morning and evening. Rinse mouth out after use --CONTINUE Albuterol  TWO puffs as needed for shortness of breath and wheezing --RESCHEDULE pulmonary function tests --Start neti pot every 2-3 days --Continue nasal sprays, alternate with flonase  and atrovent  as needed for nasal congestion  Asthma Action Plan Use albuterol  to 2 puffs three times a day for worsening shortness of breath, wheezing and cough. If you symptoms do not improve in 24-48 hours, please our office for evaluation and/or prednisone  taper.

## 2024-08-26 NOTE — Patient Instructions (Addendum)
 Moderate persistent asthma without complication --Complete prednisone  taper  --START Wixela 250-50 mcg ONE puff in the morning and evening. Rinse mouth out after use --CONTINUE Albuterol  TWO puffs as needed for shortness of breath and wheezing --RESCHEDULE pulmonary function tests --Start neti pot every 2-3 days --Continue nasal sprays, alternate with flonase  and atrovent  as needed for nasal congestion  Asthma Action Plan Use albuterol  to 2 puffs three times a day for worsening shortness of breath, wheezing and cough. If you symptoms do not improve in 24-48 hours, please our office for evaluation and/or prednisone  taper.

## 2024-08-27 ENCOUNTER — Ambulatory Visit (HOSPITAL_BASED_OUTPATIENT_CLINIC_OR_DEPARTMENT_OTHER): Admitting: Family Medicine

## 2024-08-31 ENCOUNTER — Ambulatory Visit: Admitting: Licensed Clinical Social Worker

## 2024-08-31 DIAGNOSIS — F4322 Adjustment disorder with anxiety: Secondary | ICD-10-CM | POA: Diagnosis not present

## 2024-08-31 NOTE — Progress Notes (Unsigned)
 [facility] Counselor/Therapist Progress Note  Patient ID: Molly Archer, MRN: [id number]    Date: 08/31/24  Time Spent: 0403  pm - 0459 pm : 56 Minutes  Treatment Type: Individual Therapy.  Reported Symptoms: anxiety related to social interaction and church relationships and work   Mental Status Exam: Appearance:  Casual     Behavior: Appropriate  Motor: Normal  Speech/Language:  Clear and Coherent  Affect: Appropriate  Mood: normal  Thought process: normal  Thought content:   WNL  Sensory/Perceptual disturbances:   WNL  Orientation: oriented to person, place, time/date, situation, day of week, month of year, and year  Attention: Good  Concentration: Good  Memory: WNL  Fund of knowledge:  Good  Insight:   Good  Judgment:  Good  Impulse Control: Good    Risk Assessment: Danger to Self:  No Self-injurious Behavior: No Danger to Others: No Duty to Warn:no Physical Aggression / Violence:No  Access to Firearms a concern: No  Gang Involvement:No    Subjective:    Molly Archer participated from home, via video, patient was aware of risk and limitations, and consented to treatment. Therapist participated from office. We met online due to patient request.   Molly Archer presented for her session stating that she has been doing okay but continues to be anxious and stressed where work is concerned. Patient states that there is so much work and not enough hours to get the things done that his expected. 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 Patient reports that while she continues to look for other jobs she is aware of the difficulty is may be to find another job that will accommodate all of her needs both physically and financially.  Clinician provided support via active listening and verbal interaction. Clinician processed with patient her  concerns and discussed ideas for ways to work on her confidence when public speaking.Clinician identified and processed the following with patient: Know Your Stuff: Deep understanding builds confidence. Practice Smart: Rehearse, but don't memorize every word; practice in front of a mirror or record yourself. Visualize Success: Picture the presentation going well from start to finish, holding onto those positive feelings. Shift Your Focus: Reframe anxious feelings as excitement; focus on the value you're giving the audience, not on your performance. Arrive Early: Get comfortable with the room and stage by walking around.  Molly Archer was fully engaged in session and was polite and cooperative. Molly Archer is motivated for treatment as evidenced by her active involvement in session. Molly Archer will continue to engage in CBT, mindfulness and coping skills to help manage decrease symptoms associated with their diagnosis.  Reduce overall level, frequency, and intensity of the feelings of depression and anxiety evidenced by decreased  negative self talk, and helpless feelings from 6 to 7 days/week to 0 to 1 days/week per client report for at least 3 consecutive months.Clinician and patient discussed and developed treatment plan.  Treatment plan to be reviewed by 05/11/2025.     Interventions: Cognitive Behavioral Therapy, Dialectical Behavioral Therapy, Mindfulness Meditation, Motivational Interviewing, and Solution-Oriented/Positive Psychology   Diagnosis: Adjustment Disorder with Anxious mood     Molly Archer MSW, LCSW/DATE 08/31/2024

## 2024-09-15 ENCOUNTER — Ambulatory Visit: Admitting: Licensed Clinical Social Worker

## 2024-11-30 ENCOUNTER — Encounter (HOSPITAL_BASED_OUTPATIENT_CLINIC_OR_DEPARTMENT_OTHER)

## 2024-11-30 ENCOUNTER — Ambulatory Visit (HOSPITAL_BASED_OUTPATIENT_CLINIC_OR_DEPARTMENT_OTHER): Admitting: Pulmonary Disease
